# Patient Record
Sex: Female | Born: 1975 | Race: White | Hispanic: No | Marital: Married | Smoking: Never smoker
Health system: Southern US, Community
[De-identification: ages and names within clinical notes are randomized; demographics above are authoritative.]

## PROBLEM LIST (undated history)

## (undated) DIAGNOSIS — I890 Lymphedema, not elsewhere classified: Secondary | ICD-10-CM

## (undated) DIAGNOSIS — T4145XA Adverse effect of unspecified anesthetic, initial encounter: Secondary | ICD-10-CM

## (undated) DIAGNOSIS — N644 Mastodynia: Secondary | ICD-10-CM

## (undated) DIAGNOSIS — C50919 Malignant neoplasm of unspecified site of unspecified female breast: Secondary | ICD-10-CM

## (undated) DIAGNOSIS — B373 Candidiasis of vulva and vagina: Secondary | ICD-10-CM

## (undated) DIAGNOSIS — L290 Pruritus ani: Secondary | ICD-10-CM

## (undated) DIAGNOSIS — IMO0002 Reserved for concepts with insufficient information to code with codable children: Secondary | ICD-10-CM

## (undated) DIAGNOSIS — R87619 Unspecified abnormal cytological findings in specimens from cervix uteri: Secondary | ICD-10-CM

## (undated) DIAGNOSIS — T8859XA Other complications of anesthesia, initial encounter: Secondary | ICD-10-CM

## (undated) DIAGNOSIS — D649 Anemia, unspecified: Secondary | ICD-10-CM

## (undated) DIAGNOSIS — C801 Malignant (primary) neoplasm, unspecified: Secondary | ICD-10-CM

## (undated) DIAGNOSIS — R87629 Unspecified abnormal cytological findings in specimens from vagina: Secondary | ICD-10-CM

## (undated) HISTORY — DX: Candidiasis of vulva and vagina: B37.3

## (undated) HISTORY — DX: Anemia, unspecified: D64.9

## (undated) HISTORY — PX: WISDOM TOOTH EXTRACTION: SHX21

## (undated) HISTORY — DX: Pruritus ani: L29.0

## (undated) HISTORY — DX: Malignant neoplasm of unspecified site of unspecified female breast: C50.919

## (undated) HISTORY — DX: Mastodynia: N64.4

## (undated) HISTORY — DX: Reserved for concepts with insufficient information to code with codable children: IMO0002

## (undated) HISTORY — DX: Unspecified abnormal cytological findings in specimens from cervix uteri: R87.619

---

## 1998-06-19 HISTORY — PX: PILONIDAL CYST EXCISION: SHX744

## 1998-11-03 ENCOUNTER — Ambulatory Visit (HOSPITAL_BASED_OUTPATIENT_CLINIC_OR_DEPARTMENT_OTHER): Admission: RE | Admit: 1998-11-03 | Discharge: 1998-11-03 | Payer: Self-pay | Admitting: *Deleted

## 1999-03-28 ENCOUNTER — Other Ambulatory Visit: Admission: RE | Admit: 1999-03-28 | Discharge: 1999-03-28 | Payer: Self-pay | Admitting: Obstetrics & Gynecology

## 2002-04-17 ENCOUNTER — Other Ambulatory Visit: Admission: RE | Admit: 2002-04-17 | Discharge: 2002-04-17 | Payer: Self-pay | Admitting: Obstetrics and Gynecology

## 2003-04-15 ENCOUNTER — Other Ambulatory Visit: Admission: RE | Admit: 2003-04-15 | Discharge: 2003-04-15 | Payer: Self-pay | Admitting: Obstetrics and Gynecology

## 2004-04-01 ENCOUNTER — Other Ambulatory Visit: Admission: RE | Admit: 2004-04-01 | Discharge: 2004-04-01 | Payer: Self-pay | Admitting: Obstetrics and Gynecology

## 2005-04-05 ENCOUNTER — Other Ambulatory Visit: Admission: RE | Admit: 2005-04-05 | Discharge: 2005-04-05 | Payer: Self-pay | Admitting: Obstetrics and Gynecology

## 2006-03-15 ENCOUNTER — Other Ambulatory Visit: Admission: RE | Admit: 2006-03-15 | Discharge: 2006-03-15 | Payer: Self-pay | Admitting: Obstetrics and Gynecology

## 2006-03-28 ENCOUNTER — Encounter: Admission: RE | Admit: 2006-03-28 | Discharge: 2006-03-28 | Payer: Self-pay | Admitting: Obstetrics and Gynecology

## 2006-05-23 ENCOUNTER — Encounter: Admission: RE | Admit: 2006-05-23 | Discharge: 2006-08-21 | Payer: Self-pay | Admitting: Family Medicine

## 2007-03-29 ENCOUNTER — Encounter: Admission: RE | Admit: 2007-03-29 | Discharge: 2007-03-29 | Payer: Self-pay | Admitting: Obstetrics and Gynecology

## 2007-04-05 ENCOUNTER — Encounter: Admission: RE | Admit: 2007-04-05 | Discharge: 2007-04-05 | Payer: Self-pay | Admitting: Obstetrics and Gynecology

## 2007-05-27 ENCOUNTER — Encounter: Admission: RE | Admit: 2007-05-27 | Discharge: 2007-05-27 | Payer: Self-pay | Admitting: Obstetrics and Gynecology

## 2007-05-29 ENCOUNTER — Encounter: Admission: RE | Admit: 2007-05-29 | Discharge: 2007-05-29 | Payer: Self-pay | Admitting: Obstetrics and Gynecology

## 2007-12-15 ENCOUNTER — Encounter: Admission: RE | Admit: 2007-12-15 | Discharge: 2007-12-15 | Payer: Self-pay | Admitting: Obstetrics and Gynecology

## 2008-04-10 ENCOUNTER — Encounter: Admission: RE | Admit: 2008-04-10 | Discharge: 2008-04-10 | Payer: Self-pay | Admitting: Obstetrics and Gynecology

## 2009-04-12 ENCOUNTER — Encounter: Admission: RE | Admit: 2009-04-12 | Discharge: 2009-04-12 | Payer: Self-pay | Admitting: Obstetrics and Gynecology

## 2009-10-13 ENCOUNTER — Ambulatory Visit: Payer: Self-pay | Admitting: Family Medicine

## 2009-10-13 DIAGNOSIS — J019 Acute sinusitis, unspecified: Secondary | ICD-10-CM | POA: Insufficient documentation

## 2010-01-13 ENCOUNTER — Ambulatory Visit: Payer: Self-pay | Admitting: Family Medicine

## 2010-01-13 LAB — CONVERTED CEMR LAB
Nitrite: NEGATIVE
Specific Gravity, Urine: 1.015
WBC Urine, dipstick: NEGATIVE

## 2010-01-14 ENCOUNTER — Telehealth: Payer: Self-pay | Admitting: Family Medicine

## 2010-01-14 LAB — CONVERTED CEMR LAB
AST: 11 units/L (ref 0–37)
Albumin: 5.1 g/dL (ref 3.5–5.2)
Alkaline Phosphatase: 45 units/L (ref 39–117)
BUN: 14 mg/dL (ref 6–23)
HDL: 69 mg/dL (ref >39)
Hemoglobin: 13.6 g/dL (ref 12.0–15.0)
LDL Cholesterol: 91 mg/dL (ref 0–99)
MCHC: 33.2 g/dL (ref 30.0–36.0)
Potassium: 4.8 meq/L (ref 3.5–5.3)
RDW: 13.1 % (ref 11.5–15.5)
Sodium: 139 meq/L (ref 135–145)
Total Bilirubin: 1.8 mg/dL — ABNORMAL HIGH (ref 0.3–1.2)
Total CHOL/HDL Ratio: 2.5
VLDL: 11 mg/dL (ref 0–40)

## 2010-03-29 ENCOUNTER — Encounter: Admission: RE | Admit: 2010-03-29 | Discharge: 2010-03-29 | Payer: Self-pay | Admitting: Obstetrics and Gynecology

## 2010-07-19 NOTE — Assessment & Plan Note (Signed)
Summary: CPE   Vital Signs:  Patient profile:   35 year old female Menstrual status:  regular LMP:     12/23/2009 Height:      64 inches Weight:      142 pounds BMI:     24.46 O2 Sat:      100 % on Room air Pulse rate:   71 / minute BP sitting:   135 / 82  (left arm) Cuff size:   regular  Vitals Entered By: Payton Spark CMA (January 13, 2010 10:22 AM)  O2 Flow:  Room air CC: CPE w/out pap. School forms.  Vision Screening:Left eye w/o correction: 20 / 30 Right Eye w/o correction: 20 / 13 Both eyes w/o correction:  20/ 20  Color vision testing: normal      Vision Entered By: Payton Spark CMA (January 13, 2010 9:54 AM) LMP (date): 12/23/2009     Enter LMP: 12/23/2009   Primary Care Provider:  Seymour Bars DO  CC:  CPE w/out pap. School forms..  History of Present Illness: 35 yo WF presents for CPE.  Gyn care thru [facility].  She is healthy other than a hx of allergies.  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  She Denies fam hx of premature heart dz. Mammo yearly due to fam hx of premenopausal breast cancer.  She is Due for fasting labs.  Due only for Hep A vaccine.  Tdap is UTD.     Allergies: No Known Drug Allergies  Past History:  Past Medical History: Reviewed history from 10/13/2009 and no changes required. allergies G0  gyn  Past Surgical History: Reviewed history from 10/13/2009 and no changes required. pilonidal cyst removed 2000  Family History: Reviewed history from 10/13/2009 and no changes required. mother living, breast cancer at 48 father living, HTN sisters healthy  Social History: Reviewed history from 10/13/2009 and no changes required. 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 Maried to [employer]. No kids.  Has a dog and a cat. Exercises 1 hr 4 x a wk. Eats healthy. 3 ETOH/ wk.  Review of Systems  The patient denies anorexia, fever, weight loss, weight gain, vision loss, decreased hearing,  hoarseness, chest pain, syncope, dyspnea on exertion, peripheral edema, prolonged cough, headaches, hemoptysis, abdominal pain, melena, hematochezia, severe indigestion/heartburn, hematuria, incontinence, genital sores, muscle weakness, suspicious skin lesions, transient blindness, difficulty walking, depression, unusual weight change, abnormal bleeding, enlarged lymph nodes, angioedema, breast masses, and testicular masses.    Physical Exam  General:  alert, well-developed, well-nourished, and well-hydrated.   Head:  normocephalic and atraumatic.   Eyes:  pupils equal, pupils round, and pupils reactive to light.   Ears:  EACs patent; TMs translucent and gray with good cone of light and bony landmarks.  Nose:  no nasal discharge.   Mouth:  pharynx pink and moist.   Neck:  no masses.   Lungs:  Normal respiratory effort, chest expands symmetrically. Lungs are clear to auscultation, no crackles or wheezes. Heart:  Normal rate and regular rhythm. S1 and S2 normal without gallop, murmur, click, rub or other extra sounds. Abdomen:  Bowel sounds positive,abdomen soft and non-tender without masses, organomegaly or hernias noted. Msk:  no joint tenderness, no joint swelling, and no joint warmth.   Pulses:  2+ radial and pedal pulses Extremities:  no LE edema Skin:  color normal, no rashes, and no suspicious lesions.   Cervical Nodes:  No lymphadenopathy noted Psych:  good eye contact,  not anxious appearing, and not depressed appearing.     Impression & Recommendations:  Problem # 1:  HEALTH MAINTENANCE EXAM (ICD-V70.0)  BP/ BMI at goal. Continue allergy meds as needed. REcommend Hep A vaccine at [facility]. Update fasting labs today. Gyn care per gyn.  Annual mammogram since high risk. Continue healthy diet, regular exercise, MVI daily. Return for f/u as needed.  Orders: UA Dipstick w/o Micro (automated)  (81003)  Complete Medication List: 1)  Fluticasone Propionate 50  Mcg/act Susp (Fluticasone propionate) .... Use as directed 2)  Claritin 10 Mg Tabs (Loratadine) 3)  Claritin-d 24 Hour 10-240 Mg Xr24h-tab (Loratadine-pseudoephedrine)  Other Orders: T-CBC No Diff ([id number]) T-Comprehensive Metabolic Panel ([id number]) T-Lipid Profile ([id number])  Patient Instructions: 1)  Update fasting labs today. 2)  Will call you w/ results tomorrow. 3)  Hep A vaccine at [facility] downstairs. 4)  Return for f/u as needed.  Laboratory Results   Urine Tests    Routine Urinalysis   Color: yellow Appearance: Clear Glucose: negative   (Normal Range: Negative) Bilirubin: negative   (Normal Range: Negative) Ketone: negative   (Normal Range: Negative) Spec. Gravity: 1.015   (Normal Range: 1.003-1.035) Blood: negative   (Normal Range: Negative) pH: 7.0   (Normal Range: 5.0-8.0) Protein: negative   (Normal Range: Negative) Urobilinogen: 0.2   (Normal Range: 0-1) Nitrite: negative   (Normal Range: Negative) Leukocyte Esterace: negative   (Normal Range: Negative)       Appended Document: CPE   Hepatitis A Vaccine # 1    Vaccine Type: HepA    Site: right deltoid    Dose: 1.0 ml    Route: IM    Given by: Payton Spark CMA    Exp. Date: 08/29/2011    Lot #: [id number]    VIS given: 09/06/04 version given January 13, 2010.

## 2010-07-19 NOTE — Miscellaneous (Signed)
Summary: Vaccine Record/[place]  Vaccine Record/[place]   Imported By: Lanelle Bal 01/21/2010 12:22:03  _____________________________________________________________________  External Attachment:    Type:   Image     Comment:   External Document

## 2010-07-19 NOTE — Assessment & Plan Note (Signed)
Summary: SINUS INF   Vital Signs:  Patient profile:   35 year old female Menstrual status:  regular LMP:     09/23/2009 Height:      64 inches Weight:      143 pounds BMI:     24.63 O2 Sat:      100 % on Room air Temp:     99.1 degrees F oral Pulse rate:   85 / minute BP sitting:   130 / 88  (left arm) Cuff size:   regular  Vitals Entered By: Payton Spark CMA (October 13, 2009 2:09 PM)  O2 Flow:  Room air CC: New to est. Sinusitis x 2 days, cough and allergies.  LMP (date): 09/23/2009     Menstrual Status regular Enter LMP: 09/23/2009   Primary Care Provider:  Seymour Bars DO  CC:  New to est. Sinusitis x 2 days and cough and allergies. .  History of Present Illness: 35 yo WF presents for feeling sick 8 days ago with sore throat, cough and congestion.  Started to get better but now feels worse.  Drainage changed from clear to colored.  Her cough is getting better but her sinuses are more stuffy and she is not blowing much out.  She is on Flonase + Claritin D but neither seems to be helping.  She has a HA across her forehead.  Not taking anything.  She has had chills but no fevers.  No GI upset.  She has no appetitie.    She has seasonal allergies.      Current Medications (verified): 1)  Fluticasone Propionate 50 Mcg/act Susp (Fluticasone Propionate) .... 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 Maried to [employer]. No kids.  Has a dog and a cat. Exercises 1 hr 4 x a wk. Eats healthy. 3 ETOH/ wk.  Review of Systems       no fevers/sweats/weakness, unexplained wt  loss/gain, no change in vision, no difficulty hearing, ringing in ears, no hay fever/allergies, no CP/discomfort, no palpitations, no breast lump/nipple discharge, + cough/wheeze, no blood in stool,no  N/V/D, no nocturia, no leaking urine, no unusual vag bleeding, no vaginal/penile discharge, no muscle/joint pain, no rash, no new/changing mole, no HA, no memory loss, no anxiety, no sleep problem, no depression, no unexplained lumps, no easy bruising/bleeding, no concern with sexual function   Physical Exam  General:  alert, well-developed, well-nourished, and well-hydrated.   Head:  normocephalic and atraumatic.  frontal and maxillary sinuses TTP Eyes:  conjunctiva clear Nose:  nasal congestion Mouth:  throat w/o injection; good dentition and pharynx pink and moist.   Neck:  no masses.   Lungs:  Normal respiratory effort, chest expands symmetrically. Lungs are clear to auscultation, no crackles or wheezes. Heart:  Normal rate and regular rhythm. S1 and S2 normal without gallop, murmur, click, rub or other extra sounds. Skin:  color normal.   Cervical Nodes:  shotty anterior cervical LA Psych:  good eye contact, not anxious appearing, and not depressed appearing.     Impression & Recommendations:  Problem # 1:  ACUTE SINUSITIS, UNSPECIFIED (ICD-461.9)  Her updated medication list  for this problem includes:    Fluticasone Propionate 50 Mcg/act Susp (Fluticasone propionate) ..... Use as directed    Claritin-d 24 Hour 10-240 Mg Xr24h-tab (Loratadine-pseudoephedrine)    Amoxicillin-pot Clavulanate 875-125 Mg Tabs (Amoxicillin-pot clavulanate) .Marland Kitchen... 1 tab by mouth q 12 hrs x 10 days, take with food  Instructed on treatment. Call if symptoms persist or worsen.   Complete Medication List: 1)  Fluticasone Propionate 50 Mcg/act Susp (Fluticasone propionate) .... Use as directed 2)  Claritin 10 Mg Tabs (Loratadine) 3)  Claritin-d 24 Hour 10-240 Mg Xr24h-tab (Loratadine-pseudoephedrine) 4)   Amoxicillin-pot Clavulanate 875-125 Mg Tabs (Amoxicillin-pot clavulanate) .Marland Kitchen.. 1 tab by mouth q 12 hrs x 10 days, take with food  Patient Instructions: 1)  Take 10 days of Augmentin for sinusitis. 2)  Use OTC Advil cold and sinus for symptoms. 3)  Rest, clear fluids. 4)  Call if not improved in 10 days or any problems with your meds. Prescriptions: AMOXICILLIN-POT CLAVULANATE 875-125 MG TABS (AMOXICILLIN-POT CLAVULANATE) 1 tab by mouth q 12 hrs x 10 days, take with food  #20 x 0   Entered and Authorized by:   Seymour Bars DO   Signed by:   Seymour Bars DO on 10/13/2009   Method used:   Electronically to        [facility]* (retail)       [address] [place], [place]  [address]       Ph: [phone]       Fax: [phone]   RxID:   [id number]

## 2010-07-19 NOTE — Progress Notes (Signed)
  Phone Note Call from Patient Call back at [phone]   Caller: Patient Call For: Seymour Bars DO Summary of Call: Pt LM on Michelle VM wanting to know results of hemoglobin and a copy of her labs. Initial call taken by: Kathlene November,  January 14, 2010 1:57 PM  Follow-up for Phone Call        Called pt- given results and put copy of labwork in the mail to pt Follow-up by: Kathlene November,  January 14, 2010 1:58 PM

## 2010-08-10 ENCOUNTER — Ambulatory Visit (INDEPENDENT_AMBULATORY_CARE_PROVIDER_SITE_OTHER): Payer: BC Managed Care – PPO

## 2010-08-10 ENCOUNTER — Encounter (INDEPENDENT_AMBULATORY_CARE_PROVIDER_SITE_OTHER): Payer: Self-pay | Admitting: *Deleted

## 2010-08-10 DIAGNOSIS — Z23 Encounter for immunization: Secondary | ICD-10-CM

## 2010-08-16 NOTE — Assessment & Plan Note (Signed)
Summary: 2nd round of Hep A- jr  Nurse Visit   Vitals Entered By: Payton Spark CMA (August 10, 2010 9:35 AM)  Allergies: No Known Drug Allergies  Immunizations Administered:  Hepatitis A Vaccine # 2:    Vaccine Type: HepA    Site: right deltoid    Dose: 0.5 ml    Route: IM    Given by: Payton Spark CMA    VIS given: 09/06/04 version given August 10, 2010.  Orders Added: 1)  Hepatitis A Vaccine (Adult Dose) [90632] 2)  Admin 1st Vaccine [62130]

## 2010-09-14 ENCOUNTER — Ambulatory Visit: Payer: BC Managed Care – PPO | Attending: Orthopedic Surgery | Admitting: Occupational Therapy

## 2010-09-14 DIAGNOSIS — M6281 Muscle weakness (generalized): Secondary | ICD-10-CM | POA: Insufficient documentation

## 2010-09-14 DIAGNOSIS — IMO0001 Reserved for inherently not codable concepts without codable children: Secondary | ICD-10-CM | POA: Insufficient documentation

## 2010-09-14 DIAGNOSIS — M25539 Pain in unspecified wrist: Secondary | ICD-10-CM | POA: Insufficient documentation

## 2010-09-21 ENCOUNTER — Encounter: Payer: BC Managed Care – PPO | Admitting: Occupational Therapy

## 2010-10-20 ENCOUNTER — Other Ambulatory Visit: Payer: Self-pay | Admitting: *Deleted

## 2010-10-20 MED ORDER — FLUTICASONE PROPIONATE 50 MCG/ACT NA SUSP: 2.0000 | Freq: Every day | NASAL | Status: DC

## 2011-01-17 ENCOUNTER — Other Ambulatory Visit: Payer: Self-pay | Admitting: Obstetrics and Gynecology

## 2011-01-17 DIAGNOSIS — Z1231 Encounter for screening mammogram for malignant neoplasm of breast: Secondary | ICD-10-CM

## 2011-03-13 ENCOUNTER — Other Ambulatory Visit: Payer: Self-pay | Admitting: Obstetrics and Gynecology

## 2011-03-13 ENCOUNTER — Ambulatory Visit: Payer: BC Managed Care – PPO

## 2011-03-13 DIAGNOSIS — N644 Mastodynia: Secondary | ICD-10-CM

## 2011-03-14 ENCOUNTER — Ambulatory Visit
Admission: RE | Admit: 2011-03-14 | Discharge: 2011-03-14 | Disposition: A | Discharge status: Home / Self Care | Payer: BC Managed Care – PPO | Source: Ambulatory Visit | Attending: Obstetrics and Gynecology | Admitting: Obstetrics and Gynecology

## 2011-03-14 DIAGNOSIS — N644 Mastodynia: Secondary | ICD-10-CM

## 2011-06-24 ENCOUNTER — Telehealth: Payer: Self-pay | Admitting: Family

## 2011-06-24 NOTE — Telephone Encounter (Signed)
called pts home lmovm to rtn call to scheduled appt for high risk clinic

## 2011-06-27 ENCOUNTER — Telehealth: Payer: Self-pay | Admitting: Oncology

## 2011-06-27 NOTE — Telephone Encounter (Signed)
third request for pt to rtn call and no response, will rtn chart to [facility]

## 2011-06-28 ENCOUNTER — Telehealth: Payer: Self-pay | Admitting: Family

## 2011-06-28 NOTE — Telephone Encounter (Signed)
called pt lmovm to rtn call to scheduled high risk appt

## 2011-08-09 ENCOUNTER — Encounter: Payer: Self-pay | Admitting: *Deleted

## 2011-08-09 NOTE — Progress Notes (Signed)
Mailed letter to pt about High Risk clinic. 

## 2011-12-15 ENCOUNTER — Ambulatory Visit (INDEPENDENT_AMBULATORY_CARE_PROVIDER_SITE_OTHER): Payer: BC Managed Care – PPO | Admitting: Physician Assistant

## 2011-12-15 DIAGNOSIS — Z111 Encounter for screening for respiratory tuberculosis: Secondary | ICD-10-CM

## 2011-12-15 NOTE — Progress Notes (Signed)
  Subjective:    Patient ID: Darlene Grant, female    DOB: 1976-02-01, 36 y.o.   MRN: [id number]  HPI   Pt in for a tb skin test  Review of Systems     Objective:   Physical Exam        Assessment & Plan:  PPD completed. Tandy Gaw PA-C

## 2011-12-18 LAB — TB SKIN TEST: TB Skin Test: NEGATIVE

## 2012-02-20 ENCOUNTER — Other Ambulatory Visit: Payer: Self-pay | Admitting: Obstetrics and Gynecology

## 2012-02-20 DIAGNOSIS — Z139 Encounter for screening, unspecified: Secondary | ICD-10-CM

## 2012-03-04 ENCOUNTER — Ambulatory Visit (INDEPENDENT_AMBULATORY_CARE_PROVIDER_SITE_OTHER): Payer: BC Managed Care – PPO | Admitting: Obstetrics and Gynecology

## 2012-03-04 ENCOUNTER — Encounter: Payer: Self-pay | Admitting: Obstetrics and Gynecology

## 2012-03-04 VITALS — BP 102/72 | HR 78 | Ht 64.0 in | Wt 147.0 lb

## 2012-03-04 DIAGNOSIS — Z01419 Encounter for gynecological examination (general) (routine) without abnormal findings: Secondary | ICD-10-CM

## 2012-03-04 DIAGNOSIS — Z124 Encounter for screening for malignant neoplasm of cervix: Secondary | ICD-10-CM

## 2012-03-04 MED ORDER — FLUTICASONE PROPIONATE 50 MCG/ACT NA SUSP: 2.0000 | Freq: Every day | NASAL | Status: DC

## 2012-03-04 NOTE — Patient Instructions (Signed)
Take a multivitamin with at least 400 mcg of folic acid (most prenatal vitamins have 1000 mcg = 1 mg)

## 2012-03-04 NOTE — Progress Notes (Signed)
Subjective:    Jalasia Eskridge is a 36 y.o. female, G0P0, who presents for an annual exam. The patient is considering conception.   PMH: first degree relative breast cancer at age 68, pruritis ani  Menstrual cycle:   LMP: Patient's last menstrual period was 02/15/2012.             Review of Systems Pertinent items are noted in HPI. Denies pelvic pain, urinary tract symptoms, vaginitis symptoms, irregular bleeding, menopausal symptoms, change in bowel habits or rectal bleeding   Objective:    BP 102/72  Pulse 78  Ht 5\' 4"  (1.626 m)  Wt 147 lb (66.679 kg)  BMI 25.23 kg/m2  LMP 02/15/2012   Wt Readings from Last 1 Encounters:  03/04/12 147 lb (66.679 kg)   Body mass index is 25.23 kg/(m^2). General Appearance: Alert, no acute distress HEENT: Grossly normal Neck / Thyroid: Supple, no thyromegaly or cervical adenopathy Lungs: Clear to auscultation bilaterally Back: No CVA tenderness Breast Exam: No masses or nodes.No dimpling, nipple retraction or discharge. Cardiovascular: Regular rate and rhythm.  Gastrointestinal: Soft, non-tender, no masses or organomegaly Pelvic Exam: EGBUS-wnl, vagina-normal rugae, cervix- without lesions or tenderness, uterus appears normal size shape and consistency, adnexae-no masses or tenderness Lymphatic Exam: Non-palpable nodes in neck, clavicular,  axillary, or inguinal regions  Skin: no rashes or abnormalities Extremities: no clubbing cyanosis or edema  Neurologic: grossly normal Psychiatric: Alert and oriented   Assessment:   Routine GYN Exam Considering Conception   Plan:  MVI with at least 400 mcg of folic acid PAP sent RTO 1 year or prn  Amaad Byers,ELMIRAPA-C

## 2012-03-04 NOTE — Progress Notes (Signed)
Regular Periods: yes Mammogram: yes  Monthly Breast Ex.: yes Exercise: yes  Tetanus < 10 years: yes Seatbelts: yes  NI. Bladder Functn.: yes Abuse at home: no  Daily BM's: yes Stressful Work: yes  Healthy Diet: yes Sigmoid-Colonoscopy: NO  Calcium: yes Medical problems this year: ? ABOUT CONCEPTION   LAST PAP:2012  ASCUS  HPV NOT DETECTED  Contraception: CONDOMS  Mammogram:  2012  SCHEDULED OCT. 2013  PCP: DR. Linford Arnold  PMH: NO CHANGE  FMH: NO CHANGE  Last Bone Scan: NO  PT IS MARRIED

## 2012-03-06 LAB — PAP IG W/ RFLX HPV ASCU

## 2012-03-18 ENCOUNTER — Ambulatory Visit: Payer: Self-pay | Admitting: Obstetrics and Gynecology

## 2012-03-19 ENCOUNTER — Ambulatory Visit (INDEPENDENT_AMBULATORY_CARE_PROVIDER_SITE_OTHER): Payer: BC Managed Care – PPO

## 2012-03-19 DIAGNOSIS — Z139 Encounter for screening, unspecified: Secondary | ICD-10-CM

## 2012-03-19 DIAGNOSIS — Z1231 Encounter for screening mammogram for malignant neoplasm of breast: Secondary | ICD-10-CM

## 2012-03-25 ENCOUNTER — Encounter: Payer: Self-pay | Admitting: Obstetrics and Gynecology

## 2012-03-28 ENCOUNTER — Other Ambulatory Visit: Payer: Self-pay

## 2012-04-03 ENCOUNTER — Telehealth: Payer: Self-pay | Admitting: Obstetrics and Gynecology

## 2012-04-03 ENCOUNTER — Other Ambulatory Visit: Payer: Self-pay | Admitting: Obstetrics and Gynecology

## 2012-04-03 DIAGNOSIS — Z803 Family history of malignant neoplasm of breast: Secondary | ICD-10-CM

## 2012-04-03 NOTE — Telephone Encounter (Signed)
Can pt have order for MRI

## 2012-04-04 ENCOUNTER — Telehealth: Payer: Self-pay | Admitting: Obstetrics and Gynecology

## 2012-04-04 DIAGNOSIS — Z803 Family history of malignant neoplasm of breast: Secondary | ICD-10-CM

## 2012-04-04 NOTE — Progress Notes (Signed)
Darlene Grant please schd thanks

## 2012-04-04 NOTE — Telephone Encounter (Signed)
barbra please schd

## 2012-04-10 ENCOUNTER — Encounter: Payer: Self-pay | Admitting: Obstetrics and Gynecology

## 2012-04-10 ENCOUNTER — Telehealth: Payer: Self-pay | Admitting: Obstetrics and Gynecology

## 2012-04-10 NOTE — Telephone Encounter (Signed)
Chose option #2 and spoke to [facility] about the peer to peer request from their office regarding patient's order for  a breast MRI.  Patient's mother had breast cancer at age 34 however, patient's risk cannot be calculated using the [facility] because she is < 35 YO.  BC/BS of [place] requires a risk greater than 20% in order to approve an MRI.  Darlene Farabee, PA-C

## 2012-04-19 HISTORY — PX: HAND SURGERY: SHX662

## 2012-04-22 ENCOUNTER — Other Ambulatory Visit: Payer: BC Managed Care – PPO

## 2012-04-24 ENCOUNTER — Encounter: Payer: Self-pay | Admitting: Physician Assistant

## 2012-04-24 ENCOUNTER — Ambulatory Visit (INDEPENDENT_AMBULATORY_CARE_PROVIDER_SITE_OTHER): Payer: BC Managed Care – PPO | Admitting: Physician Assistant

## 2012-04-24 VITALS — BP 113/65 | HR 69 | Ht 64.0 in | Wt 147.0 lb

## 2012-04-24 DIAGNOSIS — M25551 Pain in right hip: Secondary | ICD-10-CM

## 2012-04-24 DIAGNOSIS — M25559 Pain in unspecified hip: Secondary | ICD-10-CM

## 2012-04-24 MED ORDER — CYCLOBENZAPRINE HCL 5 MG PO TABS: 5.0000 mg | ORAL_TABLET | Freq: Every evening | ORAL | Status: DC | PRN

## 2012-04-24 MED ORDER — KETOROLAC TROMETHAMINE 30 MG/ML IJ SOLN
30.0000 mg | Freq: Once | INTRAMUSCULAR | Status: AC
  Administered 2012-04-24: 30 mg via INTRAVENOUS

## 2012-04-24 MED ORDER — MELOXICAM 7.5 MG PO TABS: 7.5000 mg | ORAL_TABLET | Freq: Every day | ORAL | Status: DC

## 2012-04-24 NOTE — Progress Notes (Signed)
  Subjective:    Patient ID: Darlene Grant, female    DOB: June 10, 1976, 36 y.o.   MRN: [id number]  HPI Patient is a pleasant 36 yo female who presents to the clinic with right hip pain. This pain has been going on for 4 days. She does not remember any trauma to the area. She walks a lot with her treadmill at inclines. She does not remember pulling anything. Her activity level has not changed either. Her pain is 6-7/10. Stepping up makes worse. Regular walking it is stable. She has tried aleve but has only helped minimally. She is going out of town this weekend and wanted to get relief before then. Has not changed activity level thinking staying active will help. Denies any numbness or tingling/radiation of pain down leg. No back pain or recent injuries.     Review of Systems     Objective:   Physical Exam  Constitutional: She appears well-developed and well-nourished.  HENT:  Head: Normocephalic and atraumatic.  Cardiovascular: Normal rate, regular rhythm and normal heart sounds.   Musculoskeletal:       Right hip-ROM was full but pain elicited with passive and active ROM internally and externally with internal greater than external. NO pinpoint tenderness of bursa. More tenderness posteriorly palpating from gluteus maximus. No iliotibial band tenderness. Hamstrings intact and no tenderness to palpation. Strength 5/5. Reflexes 2+. Negative straight leg test.  Left hip normal.          Assessment & Plan:  Right hip pain- I suspect this is hip bursitis, However, physical exam did not elicit pinpoint tenderness. I am thinking it could be inflammation of the deeper bursa or perhaps tendons connected to ischial tuberosity. Treat with mobic for 2 weeks then PRN. Gave flexeril to use at night. Encouraged ice and rest or injury. Keep walking but take out inclines for next 2 weeks. Gave stretches to start rehab process. Did give shot of toradol in office to get ahead of inflammation.  Follow up if not improving in 2 weeks.

## 2012-04-24 NOTE — Patient Instructions (Addendum)
Flexeril at bedtime. Mobic daily for 2 weeks then only PRN.     Hip Bursitis Bursitis is a swelling and soreness (inflammation) of a fluid-filled sac (bursa). This sac overlies and protects the joints.  CAUSES   Injury.  Overuse of the muscles surrounding the joint.  Arthritis.  Gout.  Infection.  Cold weather.  Inadequate warm-up and conditioning prior to activities. The cause may not be known.  SYMPTOMS   Mild to severe irritation.  Tenderness and swelling over the outside of the hip.  Pain with motion of the hip.  If the bursa becomes infected, a fever may be present. Redness, tenderness, and warmth will develop over the hip. Symptoms usually lessen in 3 to 4 weeks with treatment, but can come back. TREATMENT If conservative treatment does not work, your caregiver may advise draining the bursa and injecting cortisone into the area. This may speed up the healing process. This may also be used as an initial treatment of choice. HOME CARE INSTRUCTIONS   Apply ice to the affected area for 15 to 20 minutes every 3 to 4 hours while awake for the first 2 days. Put the ice in a plastic bag and place a towel between the bag of ice and your skin.  Rest the painful joint as much as possible, but continue to put the joint through a normal range of motion at least 4 times per day. When the pain lessens, begin normal, slow movements and usual activities to help prevent stiffness of the hip.  Only take over-the-counter or prescription medicines for pain, discomfort, or fever as directed by your caregiver.  Use crutches to limit weight bearing on the hip joint, if advised.  Elevate your painful hip to reduce swelling. Use pillows for propping and cushioning your legs and hips.  Gentle massage may provide comfort and decrease swelling. SEEK IMMEDIATE MEDICAL CARE IF:   Your pain increases even during treatment, or you are not improving.  You have a fever.  You have heat and  inflammation over the involved bursa.  You have any other questions or concerns. MAKE SURE YOU:   Understand these instructions.  Will watch your condition.  Will get help right away if you are not doing well or get worse. Document Released: 11/25/2001 Document Revised: 08/28/2011 Document Reviewed: 06/24/2008 [facility] Patient Information 2013 [place], [place].

## 2012-04-28 ENCOUNTER — Other Ambulatory Visit: Payer: BC Managed Care – PPO

## 2012-07-05 ENCOUNTER — Encounter: Payer: Self-pay | Admitting: Physician Assistant

## 2012-07-05 ENCOUNTER — Ambulatory Visit (INDEPENDENT_AMBULATORY_CARE_PROVIDER_SITE_OTHER): Payer: BC Managed Care – PPO | Admitting: Physician Assistant

## 2012-07-05 VITALS — BP 127/79 | HR 73 | Wt 147.0 lb

## 2012-07-05 DIAGNOSIS — Z0000 Encounter for general adult medical examination without abnormal findings: Secondary | ICD-10-CM

## 2012-07-05 DIAGNOSIS — T148XXA Other injury of unspecified body region, initial encounter: Secondary | ICD-10-CM | POA: Insufficient documentation

## 2012-07-05 DIAGNOSIS — Z13220 Encounter for screening for lipoid disorders: Secondary | ICD-10-CM

## 2012-07-05 DIAGNOSIS — Z131 Encounter for screening for diabetes mellitus: Secondary | ICD-10-CM

## 2012-07-05 DIAGNOSIS — D649 Anemia, unspecified: Secondary | ICD-10-CM

## 2012-07-05 LAB — LIPID PANEL
HDL: 72 mg/dL (ref >39)
LDL Cholesterol: 83 mg/dL (ref 0–99)
Total CHOL/HDL Ratio: 2.3 Ratio
Triglycerides: 45 mg/dL (ref <150)
VLDL: 9 mg/dL (ref 0–40)

## 2012-07-05 LAB — CBC
HCT: 38.3 % (ref 36.0–46.0)
Hemoglobin: 12.7 g/dL (ref 12.0–15.0)
MCV: 91.8 fL (ref 78.0–100.0)
RBC: 4.17 MIL/uL (ref 3.87–5.11)
WBC: 5 10*3/uL (ref 4.0–10.5)

## 2012-07-05 LAB — COMPREHENSIVE METABOLIC PANEL
ALT: 11 U/L (ref 0–35)
Alkaline Phosphatase: 44 U/L (ref 39–117)
Creat: 0.71 mg/dL (ref 0.50–1.10)
Sodium: 136 mEq/L (ref 135–145)
Total Bilirubin: 1.3 mg/dL — ABNORMAL HIGH (ref 0.3–1.2)
Total Protein: 7 g/dL (ref 6.0–8.3)

## 2012-07-05 LAB — TSH: TSH: 0.611 u[IU]/mL (ref 0.350–4.500)

## 2012-07-05 LAB — FERRITIN: Ferritin: 30 ng/mL (ref 10–291)

## 2012-07-05 LAB — IRON: Iron: 128 ug/dL (ref 42–145)

## 2012-07-05 LAB — IBC PANEL
TIBC: 338 ug/dL (ref 250–470)
UIBC: 210 ug/dL (ref 125–400)

## 2012-07-05 NOTE — Progress Notes (Signed)
Subjective:     Darlene Grant is a 37 y.o. female and is here for a comprehensive physical exam. The patient reports problems - The patient is concerned that she is remaining anemic. Her hemoglobin was 8.5 before surgery in November for ligament tear in right hand. patient surgery didn't fall a week before her period. She does report that her menstrual cycles are long and heavy but regular. Patient denies any shortness of breath, fatigue, weakness, hair long, skin changes or any symptoms that would be suggestive of anemia She denies any excessive pain or cramping. She would like to get pregnant in the next year and wants to make sure everything is in line to do so. She has already added folic acid and DHA to her folic acid supplements.   Her right hand is doing much better since surgery to repair a ligament tear. She will followup with the surgeon in the next month. She is currently undergoing physical therapy at home.    History   Social History  . Marital Status: Married    Spouse Name: N/A    Number of Children: N/A  . Years of Education: N/A   Occupational History  . Not on file.   Social History Main Topics  . Smoking status: Never Smoker   . Smokeless tobacco: Never Used  . Alcohol Use: Yes  . Drug Use: No  . Sexually Active: Yes    Birth Control/ Protection: Condom   Other Topics Concern  . Not on file   Social History Narrative  . No narrative on file   Health Maintenance  Topic Date Due  . Influenza Vaccine  02/17/2013  . Pap Smear  02/22/2013  . Mammogram  03/19/2013  . Tetanus/tdap  06/24/2016    The following portions of the patient's history were reviewed and updated as appropriate: allergies, current medications, past family history, past medical history, past social history, past surgical history and problem list.  Review of Systems A comprehensive review of systems was negative.   Objective:    BP 127/79  Pulse 73  Wt 147 lb (66.679 kg)  LMP  06/14/2012 General appearance: alert, cooperative and appears stated age Head: Normocephalic, without obvious abnormality, atraumatic Eyes: conjunctivae/corneas clear. PERRL, EOM's intact. Fundi benign. Ears: normal TM's and external ear canals both ears Nose: Nares normal. Septum midline. Mucosa normal. No drainage or sinus tenderness. Throat: lips, mucosa, and tongue normal; teeth and gums normal Neck: no adenopathy, no carotid bruit, no JVD, supple, symmetrical, trachea midline and thyroid not enlarged, symmetric, no tenderness/mass/nodules Back: symmetric, no curvature. ROM normal. No CVA tenderness. Lungs: clear to auscultation bilaterally Heart: regular rate and rhythm, S1, S2 normal, no murmur, click, rub or gallop Abdomen: soft, non-tender; bowel sounds normal; no masses,  no organomegaly Extremities: extremities normal, atraumatic, no cyanosis or edema Pulses: 2+ and symmetric Skin: Skin color, texture, turgor normal. No rashes or lesions Lymph nodes: Cervical, supraclavicular, and axillary nodes normal. Neurologic: Grossly normal    Assessment:    Healthy female exam.      Plan:    CPE-patient was sent to get fasting blood work as well as we will check iron panel, ferritin, TSH, B12, vitamin D, folate. Patient is on a lot of supplements and on to make sure that she is on the adequate doses of those medications. Patient encouraged to continue taking supplements and exercising regularly. Discussed the patient's importance of regular exercise even into pregnancy especially with her age and pregnancy. Vaccines are  up-to-date. Pap smear and mammogram due to family history of breast cancer up-to-date. We'll call with results and discuss treatment for anemia if labs confirm previous testing.  See After Visit Summary for Counseling Recommendations

## 2012-07-05 NOTE — Patient Instructions (Addendum)
Continue MVI. Already added Folic acid so should be great to start trying when you are ready.   Continue exercising regularly. Keep healthy diet.   Will call if you need to start iron.      Jade Breeback PA-C  [email or web address]

## 2012-07-08 ENCOUNTER — Telehealth: Payer: Self-pay | Admitting: Physician Assistant

## 2012-07-08 NOTE — Telephone Encounter (Signed)
Call pt regarding labs: Cholesterol is great. Hgb is 12.7 which is in normal range but low normal. B12, vitamin D, folate looks great. Kidney, liver and screening sugar look wonderful. TSH is on low side but normal. Ferritin iron stores are very low. I recommend starting oral iron 325mg  twice a day for 2 weeks then once a day from then on out and have ferritin and hgB recheck in 6-8 weeks to make sure is a better range.

## 2012-07-08 NOTE — Telephone Encounter (Signed)
LMOM for pt to call back for results

## 2012-07-12 ENCOUNTER — Encounter: Payer: Self-pay | Admitting: Physician Assistant

## 2012-07-12 ENCOUNTER — Telehealth: Payer: Self-pay | Admitting: Physician Assistant

## 2012-07-12 NOTE — Telephone Encounter (Signed)
Error

## 2012-08-08 ENCOUNTER — Encounter: Payer: Self-pay | Admitting: Physician Assistant

## 2012-08-09 NOTE — Telephone Encounter (Signed)
Routed to [facility]

## 2012-08-14 ENCOUNTER — Encounter: Payer: Self-pay | Admitting: Physician Assistant

## 2012-08-14 ENCOUNTER — Ambulatory Visit (INDEPENDENT_AMBULATORY_CARE_PROVIDER_SITE_OTHER): Payer: BC Managed Care – PPO | Admitting: Physician Assistant

## 2012-08-14 VITALS — BP 121/77 | HR 102 | Temp 97.8°F

## 2012-08-14 DIAGNOSIS — J069 Acute upper respiratory infection, unspecified: Secondary | ICD-10-CM

## 2012-08-14 DIAGNOSIS — J019 Acute sinusitis, unspecified: Secondary | ICD-10-CM

## 2012-08-14 MED ORDER — METHYLPREDNISOLONE SODIUM SUCC 125 MG IJ SOLR
125.0000 mg | Freq: Once | INTRAMUSCULAR | Status: AC
  Administered 2012-08-14: 125 mg via INTRAMUSCULAR

## 2012-08-14 MED ORDER — AMOXICILLIN-POT CLAVULANATE 875-125 MG PO TABS: 1.0000 | ORAL_TABLET | Freq: Two times a day (BID) | ORAL | Status: DC

## 2012-08-14 NOTE — Patient Instructions (Signed)

## 2012-08-14 NOTE — Progress Notes (Signed)
  Subjective:    Patient ID: Darlene Grant, female    DOB: 1976-04-14, 37 y.o.   MRN: [id number]  HPI Patient comes into the clinic and not been feeling well since the beginning for febuary. It started with runny nose and sinus congestion. She felt like she was getting better after taking advil cold and sinus and flonase. She then started to feel worse. 3 days ago she started having body aches, lot's of sinus pressure, mild cough, ear pressure, ST. She has not ran a fever, no n/v/d. She added mucinex D 3 days ago and has not helped. She has not been able to go to work this week.    Review of Systems     Objective:   Physical Exam  Constitutional: She is oriented to person, place, and time. She appears well-developed and well-nourished.  HENT:  Head: Normocephalic and atraumatic.  TM's clear but retracted with air bubbles bilaterally.   Frontal and maxillary tenderness.   Bilateral red and swollen turbinates.   PND on oropharynx.  Eyes: Conjunctivae are normal. Right eye exhibits no discharge. Left eye exhibits no discharge.  Neck: Normal range of motion. Neck supple.  Cardiovascular: Normal rate, regular rhythm and normal heart sounds.   Pulmonary/Chest: Effort normal and breath sounds normal. She has no wheezes.  Lymphadenopathy:    She has no cervical adenopathy.  Neurological: She is alert and oriented to person, place, and time.  Skin: Skin is warm and dry.  Psychiatric: She has a normal mood and affect. Her behavior is normal.          Assessment & Plan:  Sinusitis- treated with augementin for 10 days. Gave shot of solumedrol 125 since inflammation going on for 3 weeks. Continue flonase and advil cold and sinus. Call if not improving. Gave work note.

## 2012-08-18 ENCOUNTER — Encounter: Payer: Self-pay | Admitting: Physician Assistant

## 2012-08-19 ENCOUNTER — Other Ambulatory Visit: Payer: Self-pay | Admitting: Physician Assistant

## 2012-08-19 MED ORDER — AZITHROMYCIN 250 MG PO TABS: ORAL_TABLET | ORAL | Status: DC

## 2012-08-28 ENCOUNTER — Encounter: Payer: Self-pay | Admitting: Physician Assistant

## 2012-08-28 ENCOUNTER — Telehealth: Payer: Self-pay | Admitting: *Deleted

## 2012-08-28 DIAGNOSIS — D649 Anemia, unspecified: Secondary | ICD-10-CM

## 2012-08-28 NOTE — Telephone Encounter (Signed)
Labs ordered.

## 2012-08-30 LAB — IRON: Iron: 131 ug/dL (ref 42–145)

## 2012-08-30 LAB — FERRITIN: Ferritin: 62 ng/mL (ref 10–291)

## 2012-10-20 ENCOUNTER — Encounter: Payer: Self-pay | Admitting: Physician Assistant

## 2012-10-21 ENCOUNTER — Other Ambulatory Visit: Payer: Self-pay | Admitting: Physician Assistant

## 2012-10-21 ENCOUNTER — Telehealth: Payer: Self-pay | Admitting: Physician Assistant

## 2012-10-21 MED ORDER — FLUTICASONE PROPIONATE 50 MCG/ACT NA SUSP: 2.0000 | Freq: Every day | NASAL | Status: DC

## 2012-10-21 NOTE — Telephone Encounter (Signed)
Please send to lab. Ferritin, iron, hgb and IBC.

## 2012-10-22 ENCOUNTER — Telehealth: Payer: Self-pay | Admitting: *Deleted

## 2012-10-22 DIAGNOSIS — D649 Anemia, unspecified: Secondary | ICD-10-CM

## 2012-10-22 NOTE — Telephone Encounter (Signed)
Pt notified labs faxed

## 2012-10-22 NOTE — Telephone Encounter (Signed)
Labs ordered.

## 2012-10-26 LAB — FERRITIN: Ferritin: 44 ng/mL (ref 10–291)

## 2012-10-29 ENCOUNTER — Encounter: Payer: Self-pay | Admitting: Physician Assistant

## 2012-10-30 ENCOUNTER — Other Ambulatory Visit: Payer: Self-pay | Admitting: Physician Assistant

## 2012-10-30 DIAGNOSIS — D649 Anemia, unspecified: Secondary | ICD-10-CM

## 2012-10-31 ENCOUNTER — Other Ambulatory Visit: Payer: Self-pay | Admitting: Physician Assistant

## 2012-10-31 DIAGNOSIS — R790 Abnormal level of blood mineral: Secondary | ICD-10-CM

## 2012-10-31 DIAGNOSIS — D649 Anemia, unspecified: Secondary | ICD-10-CM

## 2012-11-01 ENCOUNTER — Encounter: Payer: Self-pay | Admitting: Physician Assistant

## 2012-11-25 ENCOUNTER — Encounter: Payer: Self-pay | Admitting: Physician Assistant

## 2012-12-09 ENCOUNTER — Ambulatory Visit (INDEPENDENT_AMBULATORY_CARE_PROVIDER_SITE_OTHER): Payer: BC Managed Care – PPO | Admitting: Physician Assistant

## 2012-12-09 DIAGNOSIS — Z111 Encounter for screening for respiratory tuberculosis: Secondary | ICD-10-CM

## 2012-12-09 NOTE — Progress Notes (Signed)
  Subjective:    Patient ID: Darlene Grant, female    DOB: [birth year], 37 y.o.   MRN: [id number]  HPI    Review of Systems     Objective:   Physical Exam        Assessment & Plan:  PPD given today. Read in 48-72 hours. Tandy Gaw PA-C

## 2012-12-11 LAB — TB SKIN TEST
Induration: 0 mm
TB Skin Test: NEGATIVE

## 2013-01-03 NOTE — Telephone Encounter (Signed)
Pt.notified

## 2013-01-03 NOTE — Telephone Encounter (Signed)
Ok to mail letter.

## 2013-01-06 ENCOUNTER — Encounter: Payer: Self-pay | Admitting: Physician Assistant

## 2013-02-24 ENCOUNTER — Other Ambulatory Visit: Payer: Self-pay

## 2013-02-24 DIAGNOSIS — Z1231 Encounter for screening mammogram for malignant neoplasm of breast: Secondary | ICD-10-CM

## 2013-03-17 LAB — OB RESULTS CONSOLE ANTIBODY SCREEN: Antibody Screen: NEGATIVE

## 2013-03-17 LAB — OB RESULTS CONSOLE GC/CHLAMYDIA
CHLAMYDIA, DNA PROBE: NEGATIVE
GC PROBE AMP, GENITAL: NEGATIVE

## 2013-03-17 LAB — OB RESULTS CONSOLE RPR: RPR: NONREACTIVE

## 2013-03-17 LAB — OB RESULTS CONSOLE HIV ANTIBODY (ROUTINE TESTING): HIV: NONREACTIVE

## 2013-03-17 LAB — OB RESULTS CONSOLE RUBELLA ANTIBODY, IGM: RUBELLA: IMMUNE

## 2013-03-17 LAB — OB RESULTS CONSOLE ABO/RH: RH Type: POSITIVE

## 2013-03-17 LAB — OB RESULTS CONSOLE HEPATITIS B SURFACE ANTIGEN: HEP B S AG: NEGATIVE

## 2013-03-20 ENCOUNTER — Ambulatory Visit: Payer: BC Managed Care – PPO

## 2013-04-24 ENCOUNTER — Other Ambulatory Visit: Payer: Self-pay

## 2013-09-17 ENCOUNTER — Inpatient Hospital Stay (HOSPITAL_COMMUNITY)
Admission: AD | Admit: 2013-09-17 | Payer: BC Managed Care – PPO | Source: Ambulatory Visit | Admitting: Obstetrics and Gynecology

## 2013-10-09 LAB — OB RESULTS CONSOLE GBS: STREP GROUP B AG: NEGATIVE

## 2013-11-03 ENCOUNTER — Encounter: Payer: Self-pay | Admitting: *Deleted

## 2013-11-19 ENCOUNTER — Inpatient Hospital Stay (HOSPITAL_COMMUNITY)
Admission: RE | Admit: 2013-11-19 | Discharge: 2013-11-22 | DRG: 765 | Disposition: A | Discharge status: Home / Self Care | Payer: BC Managed Care – PPO | Source: Ambulatory Visit | Attending: Obstetrics and Gynecology | Admitting: Obstetrics and Gynecology

## 2013-11-19 ENCOUNTER — Encounter (HOSPITAL_COMMUNITY): Payer: Self-pay

## 2013-11-19 DIAGNOSIS — D649 Anemia, unspecified: Secondary | ICD-10-CM | POA: Diagnosis present

## 2013-11-19 DIAGNOSIS — O9902 Anemia complicating childbirth: Secondary | ICD-10-CM | POA: Diagnosis present

## 2013-11-19 DIAGNOSIS — O4290 Premature rupture of membranes, unspecified as to length of time between rupture and onset of labor, unspecified weeks of gestation: Secondary | ICD-10-CM | POA: Diagnosis present

## 2013-11-19 DIAGNOSIS — O480 Post-term pregnancy: Principal | ICD-10-CM | POA: Diagnosis present

## 2013-11-19 DIAGNOSIS — Z6834 Body mass index (BMI) 34.0-34.9, adult: Secondary | ICD-10-CM

## 2013-11-19 DIAGNOSIS — E669 Obesity, unspecified: Secondary | ICD-10-CM | POA: Diagnosis present

## 2013-11-19 DIAGNOSIS — O34219 Maternal care for unspecified type scar from previous cesarean delivery: Secondary | ICD-10-CM | POA: Diagnosis present

## 2013-11-19 DIAGNOSIS — O411090 Infection of amniotic sac and membranes, unspecified, unspecified trimester, not applicable or unspecified: Secondary | ICD-10-CM | POA: Diagnosis present

## 2013-11-19 DIAGNOSIS — O09519 Supervision of elderly primigravida, unspecified trimester: Secondary | ICD-10-CM | POA: Diagnosis present

## 2013-11-19 DIAGNOSIS — O411290 Chorioamnionitis, unspecified trimester, not applicable or unspecified: Secondary | ICD-10-CM | POA: Diagnosis not present

## 2013-11-19 DIAGNOSIS — O99214 Obesity complicating childbirth: Secondary | ICD-10-CM

## 2013-11-19 LAB — CBC WITH DIFFERENTIAL/PLATELET
BASOS ABS: 0 10*3/uL (ref 0.0–0.1)
BASOS PCT: 0 % (ref 0–1)
Eosinophils Absolute: 0 10*3/uL (ref 0.0–0.7)
Eosinophils Relative: 0 % (ref 0–5)
HEMATOCRIT: 34.6 % — AB (ref 36.0–46.0)
Hemoglobin: 12 g/dL (ref 12.0–15.0)
LYMPHS PCT: 16 % (ref 12–46)
Lymphs Abs: 2 10*3/uL (ref 0.7–4.0)
MCH: 32.3 pg (ref 26.0–34.0)
MCHC: 34.7 g/dL (ref 30.0–36.0)
MCV: 93.3 fL (ref 78.0–100.0)
MONO ABS: 0.6 10*3/uL (ref 0.1–1.0)
Monocytes Relative: 5 % (ref 3–12)
Neutro Abs: 9.8 10*3/uL — ABNORMAL HIGH (ref 1.7–7.7)
Neutrophils Relative %: 79 % — ABNORMAL HIGH (ref 43–77)
Platelets: 195 10*3/uL (ref 150–400)
RBC: 3.71 MIL/uL — ABNORMAL LOW (ref 3.87–5.11)
RDW: 14.4 % (ref 11.5–15.5)
WBC: 12.4 10*3/uL — ABNORMAL HIGH (ref 4.0–10.5)

## 2013-11-19 MED ORDER — LACTATED RINGERS IV SOLN
INTRAVENOUS | Status: DC
  Administered 2013-11-19 – 2013-11-20 (×2): via INTRAVENOUS

## 2013-11-19 MED ORDER — ONDANSETRON HCL 4 MG/2ML IJ SOLN
4.0000 mg | Freq: Four times a day (QID) | INTRAMUSCULAR | Status: DC | PRN
  Administered 2013-11-20 (×2): 4 mg via INTRAVENOUS
  Filled 2013-11-19 (×2): qty 2

## 2013-11-19 MED ORDER — LIDOCAINE HCL (PF) 1 % IJ SOLN: 30.0000 mL | INTRAMUSCULAR | Status: DC | PRN

## 2013-11-19 MED ORDER — CITRIC ACID-SODIUM CITRATE 334-500 MG/5ML PO SOLN
30.0000 mL | ORAL | Status: DC | PRN
  Filled 2013-11-19: qty 15

## 2013-11-19 MED ORDER — IBUPROFEN 600 MG PO TABS: 600.0000 mg | ORAL_TABLET | Freq: Four times a day (QID) | ORAL | Status: DC | PRN

## 2013-11-19 MED ORDER — OXYTOCIN 40 UNITS IN LACTATED RINGERS INFUSION - SIMPLE MED
1.0000 m[IU]/min | INTRAVENOUS | Status: DC
  Administered 2013-11-19: 1 m[IU]/min via INTRAVENOUS
  Administered 2013-11-20: 2 m[IU]/min via INTRAVENOUS
  Filled 2013-11-19: qty 1000

## 2013-11-19 MED ORDER — NALBUPHINE HCL 10 MG/ML IJ SOLN: 10.0000 mg | INTRAMUSCULAR | Status: DC | PRN

## 2013-11-19 MED ORDER — OXYTOCIN BOLUS FROM INFUSION: 500.0000 mL | INTRAVENOUS | Status: DC

## 2013-11-19 MED ORDER — ACETAMINOPHEN 325 MG PO TABS: 650.0000 mg | ORAL_TABLET | ORAL | Status: DC | PRN

## 2013-11-19 MED ORDER — TERBUTALINE SULFATE 1 MG/ML IJ SOLN: 0.2500 mg | Freq: Once | INTRAMUSCULAR | Status: AC | PRN

## 2013-11-19 MED ORDER — OXYTOCIN 40 UNITS IN LACTATED RINGERS INFUSION - SIMPLE MED: 62.5000 mL/h | INTRAVENOUS | Status: DC

## 2013-11-19 MED ORDER — DIPHENHYDRAMINE HCL 25 MG PO CAPS
50.0000 mg | ORAL_CAPSULE | Freq: Once | ORAL | Status: AC
  Administered 2013-11-19: 50 mg via ORAL
  Filled 2013-11-19: qty 2

## 2013-11-19 MED ORDER — LACTATED RINGERS IV SOLN: 500.0000 mL | INTRAVENOUS | Status: DC | PRN

## 2013-11-19 NOTE — Progress Notes (Signed)
Advised patient clear liquid diet only patient stated she does not agree with diet plan and would like to eat when she wants patient is aware clear liquid diet is for her safety during labor and has been ordered by the midwife.

## 2013-11-19 NOTE — Progress Notes (Signed)
  Subjective: Discussed Dr. Theone Stanley recommendation to start ampicillin considering >18hrs since ROM.  Patient informed of risk of chorioamnionitis for herself and baby as well as how infection does not allow her to have waterbirth.    Patient expresses desire to have low intervention birth and states she would like to "chew" on this option for awhile. Patient also refusing LR infusion and informed of need for low infusion to kvo. Patient okay with this, but states she still wants to hydrate orally, which is okay.    Objective: BP 118/68  Pulse 113  Temp(Src) 98.4 F (36.9 C) (Oral)  Resp 16  Ht 5\' 4"  (1.626 m)  Wt 202 lb (91.627 kg)  BMI 34.66 kg/m2     FHT:  150 bpm, Mod Var, -Decels, +Accels UC:   Refuses toco, none palpated SVE:   Dilation: Closed Effacement (%): 70 Station: -3 Exam by:: Amreen Raczkowski CNM  Membranes:SROM at 0230 Pitocin: None   Assessment:  IUP at 41.1wks Cat I FT  Post Dates SROM >18hrs  Plan: -Insert IV and draw labs as ordered. -Ampicillin recommended for infection prophylaxis  -Patient to discuss with husband and doula and update staff. -Continue other mgmt as ordered  Geanie Kenning, MSN 11/19/2013, 10:26 PM   2315 Patient declines ampicillin for prophylaxis. Discussed risks of infection and informed that once chorioamnionitis diagnosed, waterbirth is no longer a birthing option. Patient verbalized understanding and states that she will consider at later time, if temperature increases. Patient preparing to go to sleep. Will continue mgmt as ordered.   J.Rameses Ou, CNM, MSN

## 2013-11-19 NOTE — H&P (Signed)
Darlene Grant is a 38 y.o. female, G1P0 at 41.1 weeks, presenting for labor augmentation.  Patient reports that she had SROM of clear fluid on 11/19/13 at 0230.  Patient states that contractions have been infrequent and mild.  Reports active fetus and denies VB.  Desires WB.  Patient Active Problem List   Diagnosis Date Noted  . Anemia 07/05/2012  . Ligament tear 07/05/2012    History of present pregnancy: Patient entered care at 10.6 weeks.   EDC of 11/11/2013 was established by Definite LMP on 02/04/2013.   Anatomy scan:  18.6 weeks, with placenta previa noted on anterior placenta.   Additional [place] evaluations:  22.6wks for previa f/u, which was resolved at that time.   Significant prenatal events:  None    Last evaluation:  11/18/2013: 1/30/-4  OB History   Grav Para Term Preterm Abortions TAB SAB Ect Mult Living   0              Past Medical History  Diagnosis Date  . Breast pain, left   . Rectal itching   . Candida vaginitis   . Abnormal Pap smear     ASCUS 03/13/11   Past Surgical History  Procedure Laterality Date  . Pilonidal cyst excision  2000  . Wisdom tooth extraction  AGE 66  . Hand surgery  04/2012    ligament repair   Family History: family history includes Cancer in her maternal grandfather and maternal grandmother; Cancer (age of onset: 30) in her mother. Social History:  reports that she has never smoked. She has never used smokeless tobacco. She reports that she drinks alcohol. She reports that she does not use illicit drugs.   Prenatal Transfer Tool  Maternal Diabetes: No Genetic Screening: Normal Maternal Ultrasounds/Referrals: Abnormal:  Findings:   Other:Previa-resolved at 22wks Fetal Ultrasounds or other Referrals:  None Maternal Substance Abuse:  No Significant Maternal Medications:  Meds include: Other:  Ca+ Mg+D, Claritin, Fluticasone NS, Folic Acid, Iron, Multivitamin, Vit C, Probiotic Significant Maternal Lab Results: Lab values include:  Group B Strep negative    ROS:  See HPI Above  Allergies  Allergen Reactions  . Augmentin [Amoxicillin-Pot Clavulanate]     diarrhea  . Hydrocodone Itching       There were no vitals taken for this visit.  Chest clear Heart RRR without murmur Abd gravid, NT Pelvic: closed/70/-3 Ext: WNL  FHR: 150s UCs:  Refuses toco  Prenatal labs: ABO, Rh:   A Positive Antibody:  Negative Rubella:   Immune RPR:   Negative HBsAg:   Negative-03/17/13 HIV:   Negative GBS:  Negative Sickle cell/Hgb electrophoresis:  N/A Pap:  WNL GC:  Neg Chlamydia:  Neg Genetic screenings:  Normal Glucola:  Negative Other:  Ferritin-WNL    Assessment IUP at 41.1wks S-PROM at [place]: 0905: Patient arrive for augmentation of labor after S-PROM at 0230. Continues to have intermittent contractions q10-15 minutes that are mild. Reports active fetus and denies VB. Patient currently declining LR infusion and wants to discuss continuous monitoring with husband and doula. Request that provider return for cervical exam and further interventions.   Admit to [facility] per consult with Dr. Theone Stanley Routine Labor and Delivery Orders Low-Dose Pitocin throughout the night for Augmentation   Ardeen Jourdain, MSN 11/19/2013, 8:05 PM

## 2013-11-20 ENCOUNTER — Encounter (HOSPITAL_COMMUNITY): Payer: Self-pay

## 2013-11-20 ENCOUNTER — Inpatient Hospital Stay (HOSPITAL_COMMUNITY): Payer: BC Managed Care – PPO | Admitting: Anesthesiology

## 2013-11-20 ENCOUNTER — Encounter (HOSPITAL_COMMUNITY): Payer: BC Managed Care – PPO | Admitting: Anesthesiology

## 2013-11-20 LAB — RPR

## 2013-11-20 MED ORDER — LIDOCAINE HCL (PF) 1 % IJ SOLN
INTRAMUSCULAR | Status: DC | PRN
  Administered 2013-11-20 (×2): 4 mL

## 2013-11-20 MED ORDER — AMPICILLIN-SULBACTAM SODIUM 3 (2-1) G IJ SOLR
3.0000 g | Freq: Four times a day (QID) | INTRAMUSCULAR | Status: DC
  Administered 2013-11-20: 3 g via INTRAVENOUS
  Filled 2013-11-20 (×4): qty 3

## 2013-11-20 MED ORDER — FENTANYL 2.5 MCG/ML BUPIVACAINE 1/10 % EPIDURAL INFUSION (WH - ANES)
INTRAMUSCULAR | Status: AC
  Filled 2013-11-20: qty 125

## 2013-11-20 MED ORDER — PHENYLEPHRINE 40 MCG/ML (10ML) SYRINGE FOR IV PUSH (FOR BLOOD PRESSURE SUPPORT): 80.0000 ug | PREFILLED_SYRINGE | INTRAVENOUS | Status: DC | PRN

## 2013-11-20 MED ORDER — FENTANYL CITRATE 0.05 MG/ML IJ SOLN
100.0000 ug | Freq: Once | INTRAMUSCULAR | Status: AC
  Administered 2013-11-20: 100 ug via INTRAVENOUS

## 2013-11-20 MED ORDER — LACTATED RINGERS IV SOLN: 500.0000 mL | Freq: Once | INTRAVENOUS | Status: DC

## 2013-11-20 MED ORDER — ACETAMINOPHEN 650 MG RE SUPP
650.0000 mg | Freq: Once | RECTAL | Status: AC
  Administered 2013-11-20: 650 mg via RECTAL
  Filled 2013-11-20: qty 1

## 2013-11-20 MED ORDER — DIPHENHYDRAMINE HCL 50 MG/ML IJ SOLN: 12.5000 mg | INTRAMUSCULAR | Status: DC | PRN

## 2013-11-20 MED ORDER — FENTANYL 2.5 MCG/ML BUPIVACAINE 1/10 % EPIDURAL INFUSION (WH - ANES)
14.0000 mL/h | INTRAMUSCULAR | Status: DC | PRN
  Administered 2013-11-20: 14 mL/h via EPIDURAL
  Filled 2013-11-20: qty 125

## 2013-11-20 MED ORDER — EPHEDRINE 5 MG/ML INJ
10.0000 mg | INTRAVENOUS | Status: DC | PRN
Start: 2013-11-20 — End: 2013-11-21

## 2013-11-20 MED ORDER — FENTANYL 2.5 MCG/ML BUPIVACAINE 1/10 % EPIDURAL INFUSION (WH - ANES)
INTRAMUSCULAR | Status: DC | PRN
  Administered 2013-11-20: 14 mL/h via EPIDURAL

## 2013-11-20 MED ORDER — EPHEDRINE 5 MG/ML INJ
INTRAVENOUS | Status: AC
  Filled 2013-11-20: qty 4

## 2013-11-20 MED ORDER — OXYTOCIN 40 UNITS IN LACTATED RINGERS INFUSION - SIMPLE MED: 1.0000 m[IU]/min | INTRAVENOUS | Status: DC

## 2013-11-20 MED ORDER — EPHEDRINE 5 MG/ML INJ: 10.0000 mg | INTRAVENOUS | Status: DC | PRN

## 2013-11-20 MED ORDER — FENTANYL CITRATE 0.05 MG/ML IJ SOLN
INTRAMUSCULAR | Status: AC
  Filled 2013-11-20: qty 2

## 2013-11-20 MED ORDER — PHENYLEPHRINE 40 MCG/ML (10ML) SYRINGE FOR IV PUSH (FOR BLOOD PRESSURE SUPPORT)
PREFILLED_SYRINGE | INTRAVENOUS | Status: AC
  Filled 2013-11-20: qty 10

## 2013-11-20 NOTE — Anesthesia Procedure Notes (Signed)
Epidural Patient location during procedure: OB Start time: 11/20/2013 4:22 PM  Staffing Anesthesiologist: Violett Hobbs A. Performed by: anesthesiologist   Preanesthetic Checklist Completed: patient identified, site marked, surgical consent, pre-op evaluation, timeout performed, IV checked, risks and benefits discussed and monitors and equipment checked  Epidural Patient position: sitting Prep: site prepped and draped and DuraPrep Patient monitoring: continuous pulse ox and blood pressure Approach: midline Location: L3-L4 Injection technique: LOR air  Needle:  Needle type: Tuohy  Needle gauge: 17 G Needle length: 9 cm and 9 Needle insertion depth: 5 cm cm Catheter type: closed end flexible Catheter size: 19 Gauge Catheter at skin depth: 10 cm Test dose: negative and Other  Assessment Events: blood not aspirated, injection not painful, no injection resistance, negative IV test and no paresthesia  Additional Notes Patient identified. Risks and benefits discussed including failed block, incomplete  Pain control, post dural puncture headache, nerve damage, paralysis, blood pressure Changes, nausea, vomiting, reactions to medications-both toxic and allergic and post Partum back pain. All questions were answered. Patient expressed understanding and wished to proceed. Sterile technique was used throughout procedure. Epidural site was Dressed with sterile barrier dressing. No paresthesias, signs of intravascular injection Or signs of intrathecal spread were encountered.  Patient was more comfortable after the epidural was dosed. Please see RN's note for documentation of vital signs and FHR which are stable.

## 2013-11-20 NOTE — Progress Notes (Signed)
  Subjective: Breathing with contractions, still q 5-6 min.  Husband and doula present.  Objective: BP 112/75  Pulse 95  Temp(Src) 98.5 F (36.9 C) (Oral)  Resp 16  Ht 5\' 4"  (1.626 m)  Wt 202 lb (91.627 kg)  BMI 34.66 kg/m2      FHT: Category 1 UC:   regular, every 5-6 minutes SVE:   Dilation: 1 Effacement (%): 100 Station: -1 Exam by:: Shaylon Aden  Pitocin at 2 mu/min  Assessment:  IUP at 41 2/7 weeks PPROM x 31 hours GBS negative  Plan: Increase pitocin slowly per patient request. Continue current care.  Donnel Saxon CNM, MS 11/20/2013, 9:54 AM

## 2013-11-20 NOTE — Progress Notes (Signed)
  Subjective:  Patient up to bathroom.  Reports that she is coping well with contractions. States that she is having back labor and feels need for position changes to help rotate baby.   Objective: BP 115/67  Pulse 83  Temp(Src) 98.6 F (37 C) (Oral)  Resp 16  Ht 5\' 4"  (1.626 m)  Wt 202 lb (91.627 kg)  BMI 34.66 kg/m2     FHT:  150bpm, Mod Var, -Decels, +Accels UC:   Q 80min, moderate to palpation SVE:   Deferred Membranes: SROM at 0230 11/19/13 Pitocin: 7mUn/min  Assessment:  IUP at 41.2wks Cat I FT  Pitocin Augmentation SROM>24hrs Afebrile  Plan: -Continue present mgmt -Position changes to promote maternal comfort and coping with contractions -Will reassess prn -Dr. Florene Route updated  Geanie Kenning, MSN 11/20/2013, 3:37 AM

## 2013-11-20 NOTE — Progress Notes (Signed)
  Subjective: Comfortable with epidural.  Objective: BP 115/44  Pulse 81  Temp(Src) 97.9 F (36.6 C) (Oral)  Resp 20  Ht 5\' 4"  (1.626 m)  Wt 202 lb (91.627 kg)  BMI 34.66 kg/m2  SpO2 99%      FHT: Category 1 UC:   irregular, every 2-4 minutes SVE:   4 cm, 80%, -1 station--narrowing of pelvis at ischial spines Pitocin at 4 cm IUPC inserted without difficulty--fluid in catheter appears lightly meconium stained. Foley inserted without difficulty  Assessment:  Early labor PROM x 40 hours GBS negative Light MSF  Plan: Continue to monitor quality of contractions, augment to achieve adequacy. Close monitoring of maternal/fetal status. Discussed use of IUPC and pitocin, uncertainty of final outcome of C/S vs. vaginal delivery. Will work with positioning to facilitate rotation and descent. Support to patient for hopes for vaginal delivery.   Donnel Saxon CNM, MN 11/20/2013, 6:32 PM

## 2013-11-20 NOTE — Progress Notes (Signed)
  Subjective: Exhausted--considering pain med or epidural.  Has been in and out of tub, ambulating, and in bed on hands and knees.  Objective: BP 115/75  Pulse 91  Temp(Src) 98.5 F (36.9 C) (Oral)  Resp 20  Ht 5\' 4"  (1.626 m)  Wt 202 lb (91.627 kg)  BMI 34.66 kg/m2  SpO2 100%      FHT: Category 1 UC:   regular, every 3 minutes SVE:   Dilation: 3.5 Effacement (%): 100 Station: -2 Exam by:: Rain Wil[place] cnm + show. Pitocin at 3 mu/min  Assessment:  Progressive labor Prolonged ROM x 38 hours GBS negative  Plan: Reviewed options for management--patient requested epidural. Will proceed with placement and re-evaluate after that.  Donnel Saxon CNM, MN 11/20/2013, 3:45p

## 2013-11-20 NOTE — Anesthesia Preprocedure Evaluation (Signed)
Anesthesia Evaluation  Patient identified by MRN, date of birth, ID band Patient awake    Reviewed: Allergy & Precautions, H&P , Patient's Chart, lab work & pertinent test results  Airway Mallampati: III TM Distance: >3 FB Neck ROM: Full    Dental no notable dental hx. (+) Teeth Intact   Pulmonary neg pulmonary ROS,  breath sounds clear to auscultation  Pulmonary exam normal       Cardiovascular negative cardio ROS  Rhythm:Regular Rate:Normal     Neuro/Psych negative neurological ROS  negative psych ROS   GI/Hepatic negative GI ROS, Neg liver ROS,   Endo/Other  Obesity  Renal/GU negative Renal ROS  negative genitourinary   Musculoskeletal negative musculoskeletal ROS (+)   Abdominal (+) + obese,   Peds  Hematology  (+) anemia ,   Anesthesia Other Findings   Reproductive/Obstetrics (+) Pregnancy PROM                           Anesthesia Physical Anesthesia Plan  ASA: II  Anesthesia Plan: Epidural   Post-op Pain Management:    Induction:   Airway Management Planned: Natural Airway  Additional Equipment:   Intra-op Plan:   Post-operative Plan:   Informed Consent: I have reviewed the patients History and Physical, chart, labs and discussed the procedure including the risks, benefits and alternatives for the proposed anesthesia with the patient or authorized representative who has indicated his/her understanding and acceptance.     Plan Discussed with: Anesthesiologist  Anesthesia Plan Comments:         Anesthesia Quick Evaluation

## 2013-11-20 NOTE — Progress Notes (Addendum)
  Subjective: Feeling very shaky, feeling vaginal pressure.  Objective: BP 113/84  Pulse 102  Temp(Src) 97.9 F (36.6 C) (Oral)  Resp 18  Ht 5\' 4"  (1.626 m)  Wt 202 lb (91.627 kg)  BMI 34.66 kg/m2  SpO2 99%      FHT: Category 1 UC:   q 3 min, MVUs 160-180 SVE:   Dilation: 6.5 Effacement (%): 100 Station: 0;-1 Exam by:: v. [place], cnm Bloody show noted Pitocin on 9 mu/min  Assessment:  Progressive labor GBS negative Afebrile, but feels warm to touch  Plan: Continue pitocin augmentation Close observation of maternal/fetal status. Watch temp.  Donnel Saxon CNM, MN 11/20/2013, 8:38 PM

## 2013-11-20 NOTE — Progress Notes (Signed)
  Subjective: Sitting in rocking chair--reports UCs stronger this am.  Declined ATB prophylaxis--agreeable with ATB if s/s of chorio occur.  Objective: BP 112/75  Pulse 95  Temp(Src) 98.7 F (37.1 C) (Oral)  Resp 16  Ht 5\' 4"  (1.626 m)  Wt 202 lb (91.627 kg)  BMI 34.66 kg/m2      Filed Vitals:   11/20/13 0026 11/20/13 0218 11/20/13 0407 11/20/13 0623  BP: 121/69 115/67 104/65 112/75  Pulse: 89 83 97 95  Temp: 99 F (37.2 C) 98.6 F (37 C) 98.4 F (36.9 C) 98.7 F (37.1 C)  TempSrc: Oral Oral Oral Oral  Resp:  16 16   Height:      Weight:         FHT: Category 1 UC:   regular, every 5-6 minutes SVE:   Deferred at present  Pitocin at 2 mu/min since 0217  Assessment:  IUP at 41 2/7 weeks Premature ROM at term, now prolonged x 29 hours. GBS negative Latent phase labor--on augmentation  Plan: Reviewed status of PROM and latent phase labor with patient, husband [name]), and doula Grayland Ormond).  Discussed increasing pitocin to facilitate efficient labor, ATB if s/s of chorio arise, FHR monitoring, judicious use of VE for assessment. Patient agreeable with increasing pitocin--would like light breakfast and opportunity to shower/freshen up prior to beginning to increase pitocin. Also agreeable to VE this am.  Will check when patient ready for pitocin to be increased.   All questions reviewed, support offered to patient birth goals, including appropriateness of selected interventions to facilitate vaginal delivery.  Donnel Saxon CNM, [place] 11/20/2013, 7:37 AM

## 2013-11-20 NOTE — Progress Notes (Signed)
  Subjective: Comfortable with epidural--feeling some vaginal pressure.  Objective: BP 123/78  Pulse 103  Temp(Src) 100.2 F (37.9 C) (Oral)  Resp 18  Ht 5\' 4"  (1.626 m)  Wt 202 lb (91.627 kg)  BMI 34.66 kg/m2  SpO2 99%      FHT: Category 1 UC:   regular, every 2 minutes SVE:   Deferred at present Pitocin at 11 mu/min MVUs 215 since 2200  Assessment:  PROM x 44 hours Maternal temp Adequate labor  Plan: Consulted with Dr. Cletis Media Unasyn 3 gm IV q 6 hours Tylenol supp now IV bolus Continue augmetation  Darlene Grant CNM, MN 11/20/2013, 10:19 PM

## 2013-11-20 NOTE — Progress Notes (Addendum)
  Subjective: More uncomfortable with contractions.  On hands and knees in bed, alternating positions.  Ready for use of tub.  Objective: BP 124/82  Pulse 115  Temp(Src) 98.5 F (36.9 C) (Oral)  Resp 16  Ht 5\' 4"  (1.626 m)  Wt 202 lb (91.627 kg)  BMI 34.66 kg/m2     FHT: Category 1 UC:   regular, every 2-3 minutes on [employer] monitor SVE: Deferred at present    Pitocin at 3 mu/min  Assessment:  PROM at term x 46 1/2 hours Progressive labor  Plan: Continue current care. Tub use prn Continuous EFM.  Donnel Saxon CNM, MN 11/20/2013, 12:52 PM

## 2013-11-21 ENCOUNTER — Encounter (HOSPITAL_COMMUNITY)
Admission: RE | Disposition: A | Discharge status: Home / Self Care | Payer: Self-pay | Source: Ambulatory Visit | Attending: Obstetrics and Gynecology

## 2013-11-21 ENCOUNTER — Encounter (HOSPITAL_COMMUNITY): Payer: Self-pay

## 2013-11-21 DIAGNOSIS — O34219 Maternal care for unspecified type scar from previous cesarean delivery: Secondary | ICD-10-CM | POA: Diagnosis present

## 2013-11-21 DIAGNOSIS — O411290 Chorioamnionitis, unspecified trimester, not applicable or unspecified: Secondary | ICD-10-CM | POA: Diagnosis not present

## 2013-11-21 LAB — CBC
HCT: 25.9 % — ABNORMAL LOW (ref 36.0–46.0)
Hemoglobin: 8.7 g/dL — ABNORMAL LOW (ref 12.0–15.0)
MCH: 31.4 pg (ref 26.0–34.0)
MCHC: 33.6 g/dL (ref 30.0–36.0)
MCV: 93.5 fL (ref 78.0–100.0)
Platelets: 192 10*3/uL (ref 150–400)
RBC: 2.77 MIL/uL — AB (ref 3.87–5.11)
RDW: 14.2 % (ref 11.5–15.5)
WBC: 24.7 10*3/uL — ABNORMAL HIGH (ref 4.0–10.5)

## 2013-11-21 LAB — FERRITIN: Ferritin: 31 ng/mL (ref 10–291)

## 2013-11-21 SURGERY — Surgical Case
Anesthesia: Epidural | Site: Abdomen

## 2013-11-21 MED ORDER — KETOROLAC TROMETHAMINE 30 MG/ML IJ SOLN: 30.0000 mg | Freq: Four times a day (QID) | INTRAMUSCULAR | Status: AC | PRN

## 2013-11-21 MED ORDER — MEPERIDINE HCL 25 MG/ML IJ SOLN
INTRAMUSCULAR | Status: AC
  Filled 2013-11-21: qty 1

## 2013-11-21 MED ORDER — SENNOSIDES-DOCUSATE SODIUM 8.6-50 MG PO TABS
2.0000 | ORAL_TABLET | ORAL | Status: DC
  Filled 2013-11-21: qty 2

## 2013-11-21 MED ORDER — WITCH HAZEL-GLYCERIN EX PADS: 1.0000 "application " | MEDICATED_PAD | CUTANEOUS | Status: DC | PRN

## 2013-11-21 MED ORDER — MORPHINE SULFATE (PF) 0.5 MG/ML IJ SOLN
INTRAMUSCULAR | Status: DC | PRN
  Administered 2013-11-21: 4 mg via EPIDURAL

## 2013-11-21 MED ORDER — DIPHENHYDRAMINE HCL 25 MG PO CAPS
25.0000 mg | ORAL_CAPSULE | ORAL | Status: DC | PRN
  Administered 2013-11-21: 25 mg via ORAL
  Filled 2013-11-21: qty 1

## 2013-11-21 MED ORDER — SIMETHICONE 80 MG PO CHEW
80.0000 mg | CHEWABLE_TABLET | ORAL | Status: DC
  Filled 2013-11-21: qty 1

## 2013-11-21 MED ORDER — SCOPOLAMINE 1 MG/3DAYS TD PT72
MEDICATED_PATCH | TRANSDERMAL | Status: AC
  Filled 2013-11-21: qty 1

## 2013-11-21 MED ORDER — OXYTOCIN 10 UNIT/ML IJ SOLN
INTRAMUSCULAR | Status: AC
Start: 2013-11-21 — End: 2013-11-21
  Filled 2013-11-21: qty 4

## 2013-11-21 MED ORDER — ONDANSETRON HCL 4 MG/2ML IJ SOLN
INTRAMUSCULAR | Status: AC
  Filled 2013-11-21: qty 2

## 2013-11-21 MED ORDER — MEPERIDINE HCL 25 MG/ML IJ SOLN: 6.2500 mg | INTRAMUSCULAR | Status: DC | PRN

## 2013-11-21 MED ORDER — IBUPROFEN 600 MG PO TABS
600.0000 mg | ORAL_TABLET | Freq: Four times a day (QID) | ORAL | Status: DC
  Administered 2013-11-21 – 2013-11-22 (×5): 600 mg via ORAL
  Filled 2013-11-21 (×5): qty 1

## 2013-11-21 MED ORDER — SIMETHICONE 80 MG PO CHEW: 80.0000 mg | CHEWABLE_TABLET | ORAL | Status: DC | PRN

## 2013-11-21 MED ORDER — NALBUPHINE HCL 10 MG/ML IJ SOLN: 5.0000 mg | INTRAMUSCULAR | Status: DC | PRN

## 2013-11-21 MED ORDER — DIPHENHYDRAMINE HCL 50 MG/ML IJ SOLN: 25.0000 mg | INTRAMUSCULAR | Status: DC | PRN

## 2013-11-21 MED ORDER — DEXAMETHASONE SODIUM PHOSPHATE 10 MG/ML IJ SOLN
INTRAMUSCULAR | Status: AC
  Filled 2013-11-21: qty 1

## 2013-11-21 MED ORDER — SIMETHICONE 80 MG PO CHEW
80.0000 mg | CHEWABLE_TABLET | Freq: Three times a day (TID) | ORAL | Status: DC
  Administered 2013-11-21 – 2013-11-22 (×3): 80 mg via ORAL
  Filled 2013-11-21 (×4): qty 1

## 2013-11-21 MED ORDER — ONDANSETRON HCL 4 MG/2ML IJ SOLN
INTRAMUSCULAR | Status: DC | PRN
  Administered 2013-11-21: 4 mg via INTRAVENOUS

## 2013-11-21 MED ORDER — ONDANSETRON HCL 4 MG/2ML IJ SOLN: 4.0000 mg | INTRAMUSCULAR | Status: DC | PRN

## 2013-11-21 MED ORDER — FERROUS SULFATE 325 (65 FE) MG PO TABS
325.0000 mg | ORAL_TABLET | Freq: Two times a day (BID) | ORAL | Status: DC
  Administered 2013-11-21 – 2013-11-22 (×4): 325 mg via ORAL
  Filled 2013-11-21 (×3): qty 1

## 2013-11-21 MED ORDER — TETANUS-DIPHTH-ACELL PERTUSSIS 5-2.5-18.5 LF-MCG/0.5 IM SUSP: 0.5000 mL | Freq: Once | INTRAMUSCULAR | Status: DC

## 2013-11-21 MED ORDER — ZOLPIDEM TARTRATE 5 MG PO TABS: 5.0000 mg | ORAL_TABLET | Freq: Every evening | ORAL | Status: DC | PRN

## 2013-11-21 MED ORDER — LACTATED RINGERS IV SOLN
INTRAVENOUS | Status: DC
  Administered 2013-11-21: 11:00:00 via INTRAVENOUS

## 2013-11-21 MED ORDER — BUPIVACAINE HCL (PF) 0.25 % IJ SOLN
INTRAMUSCULAR | Status: DC | PRN
  Administered 2013-11-21: 20 mL

## 2013-11-21 MED ORDER — SODIUM CHLORIDE 0.9 % IV SOLN
3.0000 g | Freq: Four times a day (QID) | INTRAVENOUS | Status: AC
  Administered 2013-11-21 (×4): 3 g via INTRAVENOUS
  Filled 2013-11-21 (×5): qty 3

## 2013-11-21 MED ORDER — FENTANYL CITRATE 0.05 MG/ML IJ SOLN: 25.0000 ug | INTRAMUSCULAR | Status: DC | PRN

## 2013-11-21 MED ORDER — ONDANSETRON HCL 4 MG PO TABS: 4.0000 mg | ORAL_TABLET | ORAL | Status: DC | PRN

## 2013-11-21 MED ORDER — LACTATED RINGERS IV SOLN
INTRAVENOUS | Status: DC | PRN
  Administered 2013-11-21 (×4): via INTRAVENOUS

## 2013-11-21 MED ORDER — METOCLOPRAMIDE HCL 5 MG/ML IJ SOLN: 10.0000 mg | Freq: Three times a day (TID) | INTRAMUSCULAR | Status: DC | PRN

## 2013-11-21 MED ORDER — LANOLIN HYDROUS EX OINT: 1.0000 "application " | TOPICAL_OINTMENT | CUTANEOUS | Status: DC | PRN

## 2013-11-21 MED ORDER — KETOROLAC TROMETHAMINE 60 MG/2ML IM SOLN
60.0000 mg | Freq: Once | INTRAMUSCULAR | Status: AC | PRN
  Administered 2013-11-21: 60 mg via INTRAMUSCULAR

## 2013-11-21 MED ORDER — MEASLES, MUMPS & RUBELLA VAC ~~LOC~~ INJ
0.5000 mL | INJECTION | Freq: Once | SUBCUTANEOUS | Status: DC
  Filled 2013-11-21: qty 0.5

## 2013-11-21 MED ORDER — DEXAMETHASONE SODIUM PHOSPHATE 10 MG/ML IJ SOLN
INTRAMUSCULAR | Status: DC | PRN
  Administered 2013-11-21: 10 mg via INTRAVENOUS

## 2013-11-21 MED ORDER — SODIUM CHLORIDE 0.9 % IJ SOLN: 3.0000 mL | INTRAMUSCULAR | Status: DC | PRN

## 2013-11-21 MED ORDER — TRAMADOL HCL 50 MG PO TABS: 50.0000 mg | ORAL_TABLET | Freq: Four times a day (QID) | ORAL | Status: DC | PRN

## 2013-11-21 MED ORDER — FENTANYL CITRATE 0.05 MG/ML IJ SOLN
INTRAMUSCULAR | Status: AC
  Filled 2013-11-21: qty 2

## 2013-11-21 MED ORDER — KETOROLAC TROMETHAMINE 60 MG/2ML IM SOLN
INTRAMUSCULAR | Status: AC
  Filled 2013-11-21: qty 2

## 2013-11-21 MED ORDER — OXYTOCIN 40 UNITS IN LACTATED RINGERS INFUSION - SIMPLE MED: 62.5000 mL/h | INTRAVENOUS | Status: AC

## 2013-11-21 MED ORDER — DIBUCAINE 1 % RE OINT: 1.0000 "application " | TOPICAL_OINTMENT | RECTAL | Status: DC | PRN

## 2013-11-21 MED ORDER — LIDOCAINE-EPINEPHRINE (PF) 2 %-1:200000 IJ SOLN
INTRAMUSCULAR | Status: AC
  Filled 2013-11-21: qty 20

## 2013-11-21 MED ORDER — DIPHENHYDRAMINE HCL 25 MG PO CAPS: 25.0000 mg | ORAL_CAPSULE | Freq: Four times a day (QID) | ORAL | Status: DC | PRN

## 2013-11-21 MED ORDER — LACTATED RINGERS IV SOLN
INTRAVENOUS | Status: DC | PRN
  Administered 2013-11-21: 01:00:00 via INTRAVENOUS

## 2013-11-21 MED ORDER — MEPERIDINE HCL 25 MG/ML IJ SOLN
INTRAMUSCULAR | Status: DC | PRN
  Administered 2013-11-21 (×2): 12.5 mg via INTRAVENOUS

## 2013-11-21 MED ORDER — ONDANSETRON HCL 4 MG/2ML IJ SOLN: 4.0000 mg | Freq: Three times a day (TID) | INTRAMUSCULAR | Status: DC | PRN

## 2013-11-21 MED ORDER — BUPIVACAINE HCL (PF) 0.25 % IJ SOLN
INTRAMUSCULAR | Status: AC
  Filled 2013-11-21: qty 30

## 2013-11-21 MED ORDER — SCOPOLAMINE 1 MG/3DAYS TD PT72
1.0000 | MEDICATED_PATCH | Freq: Once | TRANSDERMAL | Status: DC
  Administered 2013-11-21: 1.5 mg via TRANSDERMAL

## 2013-11-21 MED ORDER — METOCLOPRAMIDE HCL 5 MG/ML IJ SOLN: 10.0000 mg | Freq: Once | INTRAMUSCULAR | Status: DC | PRN

## 2013-11-21 MED ORDER — SODIUM BICARBONATE 8.4 % IV SOLN
INTRAVENOUS | Status: DC | PRN
  Administered 2013-11-21 (×2): 5 mL via EPIDURAL

## 2013-11-21 MED ORDER — MENTHOL 3 MG MT LOZG: 1.0000 | LOZENGE | OROMUCOSAL | Status: DC | PRN

## 2013-11-21 MED ORDER — MORPHINE SULFATE (PF) 0.5 MG/ML IJ SOLN
INTRAMUSCULAR | Status: DC | PRN
  Administered 2013-11-21: 1 mg via INTRAVENOUS

## 2013-11-21 MED ORDER — 0.9 % SODIUM CHLORIDE (POUR BTL) OPTIME
TOPICAL | Status: DC | PRN
  Administered 2013-11-21: 1000 mL

## 2013-11-21 MED ORDER — METHYLERGONOVINE MALEATE 0.2 MG/ML IJ SOLN: 0.2000 mg | INTRAMUSCULAR | Status: DC | PRN

## 2013-11-21 MED ORDER — SODIUM BICARBONATE 8.4 % IV SOLN
INTRAVENOUS | Status: AC
  Filled 2013-11-21: qty 50

## 2013-11-21 MED ORDER — OXYTOCIN 10 UNIT/ML IJ SOLN
40.0000 [IU] | INTRAVENOUS | Status: DC | PRN
  Administered 2013-11-21: 40 [IU] via INTRAVENOUS

## 2013-11-21 MED ORDER — DEXTROSE 5 % IV SOLN
1.0000 ug/kg/h | INTRAVENOUS | Status: DC | PRN
  Filled 2013-11-21: qty 2

## 2013-11-21 MED ORDER — BUPIVACAINE-EPINEPHRINE (PF) 0.25% -1:200000 IJ SOLN
INTRAMUSCULAR | Status: AC
  Filled 2013-11-21: qty 30

## 2013-11-21 MED ORDER — CEFAZOLIN SODIUM-DEXTROSE 2-3 GM-% IV SOLR
2.0000 g | Freq: Once | INTRAVENOUS | Status: AC
  Administered 2013-11-21: 2 g via INTRAVENOUS
  Filled 2013-11-21: qty 50

## 2013-11-21 MED ORDER — DIPHENHYDRAMINE HCL 50 MG/ML IJ SOLN: 12.5000 mg | INTRAMUSCULAR | Status: DC | PRN

## 2013-11-21 MED ORDER — FENTANYL CITRATE 0.05 MG/ML IJ SOLN
INTRAMUSCULAR | Status: DC | PRN
  Administered 2013-11-21: 100 ug via EPIDURAL

## 2013-11-21 MED ORDER — NALOXONE HCL 0.4 MG/ML IJ SOLN: 0.4000 mg | INTRAMUSCULAR | Status: DC | PRN

## 2013-11-21 MED ORDER — PRENATAL MULTIVITAMIN CH
1.0000 | ORAL_TABLET | Freq: Every day | ORAL | Status: DC
  Administered 2013-11-21 – 2013-11-22 (×2): 1 via ORAL
  Filled 2013-11-21 (×2): qty 1

## 2013-11-21 MED ORDER — MORPHINE SULFATE 0.5 MG/ML IJ SOLN
INTRAMUSCULAR | Status: AC
  Filled 2013-11-21: qty 10

## 2013-11-21 MED ORDER — METHYLERGONOVINE MALEATE 0.2 MG PO TABS: 0.2000 mg | ORAL_TABLET | ORAL | Status: DC | PRN

## 2013-11-21 SURGICAL SUPPLY — 34 items
BENZOIN TINCTURE PRP APPL 2/3 (GAUZE/BANDAGES/DRESSINGS) ×2 IMPLANT
BOOTIES KNEE HIGH SLOAN (MISCELLANEOUS) ×4 IMPLANT
CLAMP CORD UMBIL (MISCELLANEOUS) IMPLANT
CLOTH BEACON ORANGE TIMEOUT ST (SAFETY) ×2 IMPLANT
DRAIN JACKSON PRT FLT 10 (DRAIN) IMPLANT
DRAPE LG THREE QUARTER DISP (DRAPES) IMPLANT
DRSG OPSITE POSTOP 4X10 (GAUZE/BANDAGES/DRESSINGS) ×2 IMPLANT
DURAPREP 26ML APPLICATOR (WOUND CARE) ×2 IMPLANT
ELECT REM PT RETURN 9FT ADLT (ELECTROSURGICAL) ×2
ELECTRODE REM PT RTRN 9FT ADLT (ELECTROSURGICAL) ×1 IMPLANT
EVACUATOR SILICONE 100CC (DRAIN) IMPLANT
EXTRACTOR VACUUM M CUP 4 TUBE (SUCTIONS) IMPLANT
GLOVE BIOGEL PI IND STRL 7.0 (GLOVE) ×1 IMPLANT
GLOVE BIOGEL PI INDICATOR 7.0 (GLOVE) ×1
GLOVE ECLIPSE 6.5 STRL STRAW (GLOVE) ×2 IMPLANT
GOWN STRL REUS W/TWL LRG LVL3 (GOWN DISPOSABLE) ×4 IMPLANT
KIT ABG SYR 3ML LUER SLIP (SYRINGE) IMPLANT
NEEDLE HYPO 22GX1.5 SAFETY (NEEDLE) ×2 IMPLANT
NEEDLE HYPO 25X5/8 SAFETYGLIDE (NEEDLE) IMPLANT
NS IRRIG 1000ML POUR BTL (IV SOLUTION) ×4 IMPLANT
PACK C SECTION WH (CUSTOM PROCEDURE TRAY) ×2 IMPLANT
PAD OB MATERNITY 4.3X12.25 (PERSONAL CARE ITEMS) ×2 IMPLANT
RTRCTR C-SECT PINK 25CM LRG (MISCELLANEOUS) ×2 IMPLANT
STRIP CLOSURE SKIN 1/2X4 (GAUZE/BANDAGES/DRESSINGS) ×2 IMPLANT
SUT CHROMIC GUT AB #0 18 (SUTURE) IMPLANT
SUT MNCRL AB 3-0 PS2 27 (SUTURE) ×2 IMPLANT
SUT SILK 2 0 FSL 18 (SUTURE) IMPLANT
SUT VIC AB 0 CTX 36 (SUTURE) ×3
SUT VIC AB 0 CTX36XBRD ANBCTRL (SUTURE) ×3 IMPLANT
SUT VIC AB 1 CT1 36 (SUTURE) ×4 IMPLANT
SYR 20CC LL (SYRINGE) ×2 IMPLANT
TOWEL OR 17X24 6PK STRL BLUE (TOWEL DISPOSABLE) ×2 IMPLANT
TRAY FOLEY CATH 14FR (SET/KITS/TRAYS/PACK) ×2 IMPLANT
WATER STERILE IRR 1000ML POUR (IV SOLUTION) ×2 IMPLANT

## 2013-11-21 NOTE — Addendum Note (Signed)
Addendum created 11/21/13 1426 by Garner Nash, CRNA   Modules edited: Notes Section   Notes Section:  File: [id number]

## 2013-11-21 NOTE — Progress Notes (Signed)
Pt refuses for pulse on to be put on finger and when on toe scd maching makes it go off constantly.  Refuses to do incentive spirometer

## 2013-11-21 NOTE — Anesthesia Postprocedure Evaluation (Signed)
  Anesthesia Post-op Note  Anesthesia Post Note  Patient: Darlene Grant  Procedure(s) Performed: Procedure(s) (LRB): CESAREAN SECTION (N/A)  Anesthesia type: Epidural  Patient location: PACU  Post pain: Pain level controlled  Post assessment: Post-op Vital signs reviewed  Last Vitals:  Filed Vitals:   11/21/13 0345  BP: 112/71  Pulse: 96  Temp: 37.1 C  Resp: 20    Post vital signs: stable  Level of consciousness: awake  Complications: No apparent anesthesia complications

## 2013-11-21 NOTE — Progress Notes (Addendum)
  Subjective: Shaking at times, feeling vaginal pressure.  Objective: BP 112/61  Pulse 76  Temp(Src) 102.7 F (39.3 C) (Oral)  Resp 18  Ht 5\' 4"  (1.626 m)  Wt 202 lb (91.627 kg)  BMI 34.66 kg/m2  SpO2 99%   Total I/O In: -  Out: 650 [Urine:650]  FHT: Category 1 UC:   regular, every 2-3 minutes SVE:   Dilation: 7 Effacement (%): 100 Station: 0 Exam by:: V Gary Bultman  Asynclitic, with molding, more cervix on right than left. No descent of widest diameter of vtx beyond ischial spines. Pitocin at 11 mu/min MVUs 200+ since 10pm  1st dose Unsyn at 2154 Tylenol supp 2150   Assessment:  Chorioamnionitis Prolonged ROM x 45.5 hours Likely arrest of active labor  Plan: Consulted with Dr. Cletis Media.  C/S recommended for above indications. Patient consented for C/S.  R&B reviewed, including bleeding, infection, and damage to other organs.  Patient and husband seem to understand these risks and are agreeable with proceeding with C/S. Ancef 2 gm IV.  Donnel Saxon CNM, MN 11/21/2013, 12:03 AM

## 2013-11-21 NOTE — Transfer of Care (Signed)
Immediate Anesthesia Transfer of Care Note  Patient: Darlene Grant  Procedure(s) Performed: Procedure(s): CESAREAN SECTION (N/A)  Patient Location: PACU  Anesthesia Type:Epidural  Level of Consciousness: awake  Airway & Oxygen Therapy: Patient Spontanous Breathing  Post-op Assessment: Report given to PACU RN and Post -op Vital signs reviewed and stable  Post vital signs: stable  Complications: No apparent anesthesia complications

## 2013-11-21 NOTE — Progress Notes (Signed)
Subjective: Postpartum Day 0: Cesarean Delivery due to Failure to Progress & Chorioamnionitis Patient remains in bed with foley catheter in place. Reports fatigue.  Oriented, but displaying incoherent and disorganized speech. Behavior is appropriate. Mood is stable, affect euphoric. Patient denies recent pain medication or current pain.  Feeding:  Breast Contraceptive plan:  Not discussed. Discussed current HgB level with husband and need for Fe+supplemenation.  Offered liquid preparation if desired.  Also discussed need for patient to perform IS and ambulate after adequate rest. Husband verbalized understanding regarding patient's needs and additions to POC.   Objective: Vital signs in last 24 hours: Temp:  [97.8 F (36.6 C)-102.7 F (39.3 C)] 98 F (36.7 C) (06/05 0630) Pulse Rate:  [70-121] 89 (06/05 0630) Resp:  [11-31] 18 (06/05 0630) BP: (95-134)/(44-90) 107/58 mmHg (06/05 0630) SpO2:  [93 %-100 %] 95 % (06/05 0630)  Physical Exam:  General: delirious, fatigued and no distress Lochia: appropriate Uterine Fundus: firm, U/-2 Abdomen: Soft , non tender, BS not assessed Incision: Honeycomb dressing CDI DVT Evaluation: Not assessed JP drain:   N/A   Recent Labs  11/19/13 2230 11/21/13 0615  HGB 12.0 8.7*  HCT 34.6* 25.9*  WBC 12.4* 24.7*    Assessment/Plan: S/P Primary C/S-Day 0 Stable Anemia  Delirious  Fe+ supplementation Rest encouraged Reassess prn   Gavin Pound CNM, MSN 11/21/2013, 8:06 AM

## 2013-11-21 NOTE — Op Note (Signed)
Preoperative diagnosis: Intrauterine pregnancy at 41 weeks and 3 days with failure to progress and chorioamnionitis  Post operative diagnosis: Same  Anesthesia: Epidural  Anesthesiologist: Dr. Assunta Gambles  Procedure: Primary low transverse cesarean section  Surgeon: Dr. Katharine Look Othella Slappey  Assistant: Donnel Saxon CNM  Estimated blood loss: 800 cc  Procedure:  After being informed of the planned procedure and possible complications including bleeding, infection, injury to other organs, informed consent is obtained. The patient is taken to OR #9 and pre-existing epidural anesthesia was optimized  without complication. She is placed in the dorsal decubitus position with the pelvis tilted to the left. She is then prepped and draped in a sterile fashion. A Foley catheter is  in her bladder.  After assessing adequate level of anesthesia, we infiltrate the suprapubic area with 20 cc of Marcaine 0.25 and perform a Pfannenstiel incision which is brought down sharply to the fascia. The fascia is entered in a low transverse fashion. Linea alba is dissected. Peritoneum is entered in a midline fashion. An Alexis retractor is easily positioned.   The myometrium is then entered in a low transverse fashion, 2 cm above the vesico-uterine junction ; first with knife and then extended bluntly. Amniotic fluid is meconium stained. We assist the birth of a female  infant in vertex presentation. Mouth and nose are suctioned. The baby is delivered. The cord is clamped and sectioned. The baby is given to the neonatologist present in the room.  10 cc of blood is drawn from the umbilical vein.The placenta is allowed to deliver spontaneously. It is complete and the cord has 3 vessels. Uterine revision is negative.  We proceed with closure of the myometrium in 2 layers: First with a running locked suture of 0 Vicryl, then with a Lembert suture of 0 Vicryl imbricating the first one. Hemostasis is completed with cauterization  on peritoneal edges and a figure-of-eight stitch of 0-Vicryl on the right angle.  Both paracolic gutters are cleaned. Both tubes and ovaries are assessed and normal. The pelvis is profusely irrigated with warm saline to confirm a satisfactory hemostasis.  Retractors and sponges are removed. Under fascia hemostasis is completed with cauterization. The fascia is then closed with 2 running sutures of 0 Vicryl meeting midline. The wound is irrigated with warm saline and hemostasis is completed with cauterization. The skin is closed with a subcuticular suture of 3-0 Monocryl and Steri-Strips.  Instrument and sponge count is complete x2. Estimated blood loss is 800 cc.  The procedure is well tolerated by the patient who is taken to recovery room in a well and stable condition.  female baby  was born at 00:50 and received an Apgar of 9  at 1 minute and 9 at 5 minutes.    Specimen: Placenta sent to pathology.  Dede Query Special Ranes MD 6/5/20151:29 AM

## 2013-11-21 NOTE — Lactation Note (Signed)
This note was copied from the chart of [place]. Lactation Consultation Note Refused my services, waiting on Dr. Linus Mako wife for lactation to come and see her. No offense to our [facility] but she wanted consistency. RN reported her nipples were flat and baby couldn't latch. Pt. Allowed me to assess them and I noted bruising all over both nipples where the baby sucked but wasn't able to latch. Hand pump given to help pull out nipples and mom could give colostrum if she wanted. Mom too hand pump but didn't want a curve tip syring to give any colostrum until Mrs. Bodie arrived. Nipples are flat w/bouncy areolas. Unable to latch baby d/t not compressible to get into baby's mouth. Dr. Donna Christen in house and notified of status. Reviewed Baby & Me book's Breastfeeding Basics. Mom encouraged to feed baby w/feeding cuesHand expression taught to Mom. [place] brochure given w/resources, support groups and [facility] services.Referred to Baby and Me Book in Breastfeeding section Pg. 22-23 for position options and Proper latch demonstration. Patient Name: Darlene Grant ELFYB'O Date: 11/21/2013     Maternal Data    Feeding    LATCH Score/Interventions                      Lactation Tools Discussed/Used     Consult Status      Theodoro Kalata 11/21/2013, 6:19 AM

## 2013-11-21 NOTE — Anesthesia Postprocedure Evaluation (Signed)
  Anesthesia Post-op Note  Patient: Darlene Grant  Procedure(s) Performed: Procedure(s): CESAREAN SECTION (N/A)  Patient Location: Mother/Baby  Anesthesia Type:Epidural  Level of Consciousness: awake and alert   Airway and Oxygen Therapy: Patient Spontanous Breathing  Post-op Pain: mild  Post-op Assessment: Post-op Vital signs reviewed, Patient's Cardiovascular Status Stable, Respiratory Function Stable, No signs of Nausea or vomiting, Pain level controlled, No headache, No residual numbness and No residual motor weakness  Post-op Vital Signs: Reviewed  Last Vitals:  Filed Vitals:   11/21/13 1300  BP: 106/55  Pulse: 99  Temp: 36.8 C  Resp: 20    Complications: No apparent anesthesia complications

## 2013-11-21 NOTE — Lactation Note (Signed)
This note was copied from the chart of [place]. Lactation Consultation Note  Patient Name: Darlene Grant Today's Date: 11/21/2013 Reason for consult: Initial assessment of this primipara and her newborn, now 32 hours postpartum. Baby breastfed in PACU, spent 40 minutes STS and has had several more 10 minute feedings since then, as well as first void and stool.   Mom has already received two visits today from her private [place], Henreitta Leber, [facility] and denies any current breastfeeding concerns.   LC provided [employer] [facility] brochure and reviewed [facility] services and list of community and web site resources.   Maternal Data Formula Feeding for Exclusion: No Infant to breast within first hour of birth: Yes Has patient been taught Hand Expression?: Yes (documented by her nurse and mom seen by private [facility]) Does the patient have breastfeeding experience prior to this delivery?: No  Feeding Feeding Type: Breast Fed  LATCH Score/Interventions            Initial LATCH score=7 after delivery          Lactation Tools Discussed/Used   Mom has private [facility]; denies need for hospital [facility] services at this time  Consult Status Consult Status: PRN    Landis Gandy 11/21/2013, 7:05 PM

## 2013-11-22 MED ORDER — TRAMADOL HCL 50 MG PO TABS: 50.0000 mg | ORAL_TABLET | Freq: Four times a day (QID) | ORAL | Status: DC | PRN

## 2013-11-22 MED ORDER — IBUPROFEN 600 MG PO TABS: 600.0000 mg | ORAL_TABLET | Freq: Four times a day (QID) | ORAL | Status: DC

## 2013-11-22 NOTE — Discharge Summary (Signed)
Cesarean Section Delivery Discharge Summary  Darlene Grant  DOB:    October 11, 1975 MRN:    [id number] CSN:    [id number]  Date of admission:                  11/19/13  Date of discharge:                   11/22/13  Procedures this admission: C/S for FTP and Chorio after Augmentation for SROM/PROM  Date of Delivery: 11/21/13 by Dr Cletis Media  Newborn Data:  Live born female  Birth Weight: 9 lb 4.8 oz (4218 g) APGAR: 9, 9  Home with mother Circumcision Plan: Done in hospital  History of Present Illness:  Ms. Darlene Grant is a 38 y.o. female, G1P1001, who presents at [redacted]w[redacted]d weeks gestation. The patient has been followed at the [facility] and Gynecology division of [facility].    Her pregnancy has been complicated by:  Patient Active Problem List   Diagnosis Date Noted  . Cesarean delivery delivered 11/21/2013  . 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 PROM (premature rupture of membranes) 11/19/2013  . Anemia 07/05/2012  . Ligament tear 07/05/2012     Hospital course:  The patient was admitted for PROM/SROM.   Her postpartum course was not complicated.  She was discharged to home on postpartum day 1 doing well.  Feeding:  breast  Contraception:  condoms  Discharge hemoglobin:  Hemoglobin  Date Value Ref Range Status  11/21/2013 8.7* 12.0 - 15.0 g/dL Final     DELTA CHECK NOTED     REPEATED TO VERIFY     HCT  Date Value Ref Range Status  11/21/2013 25.9* 36.0 - 46.0 % Final    Discharge Physical Exam:   General: alert, cooperative and no distress Lochia: appropriate Uterine Fundus: firm Incision: healing well DVT Evaluation: No evidence of DVT seen on physical exam. Negative Homan's sign.  Intrapartum Procedures: cesarean: low cervical, transverse Postpartum Procedures: antibiotics Complications-Operative and Postpartum: none  Discharge Diagnoses: Term Pregnancy-delivered, Amnionitis, Post-date pregnancy and  asymptomatic anemia  Discharge Information:  Activity:           pelvic rest Diet:                routine Medications: PNV, Ibuprofen and Iron Condition:      stable Instructions:  Care After Cesarean Delivery  Refer to this sheet in the next few weeks. These instructions provide you with information on caring for yourself after your procedure. Your caregiver may also give you specific instructions. Your treatment has been planned according to current medical practices, but problems sometimes occur. Call your caregiver if you have any problems or questions after you go home. HOME CARE INSTRUCTIONS  Only take over-the-counter or prescription medicines as directed by your caregiver.  Do not drink alcohol, especially if you are breastfeeding or taking medicine to relieve pain.  Do not chew or smoke tobacco.  Continue to use good perineal care. Good perineal care includes:  Wiping your perineum from front to back.  Keeping your perineum clean.  Check your cut (incision) daily for increased redness, drainage, swelling, or separation of skin.  Clean your incision gently with soap and water every day, and then pat it dry. If your caregiver says it is okay, leave the incision uncovered. Use a bandage (dressing) if the incision is draining fluid or appears irritated. If the adhesive strips across the incision do not fall off within 7 days, carefully  peel them off.  Hug a pillow when coughing or sneezing until your incision is healed. This helps to relieve pain.  Do not use tampons or douche until your caregiver says it is okay.  Shower, wash your hair, and take tub baths as directed by your caregiver.  Wear a well-fitting bra that provides breast support.  Limit wearing support panties or control-top hose.  Drink enough fluids to keep your urine clear or pale yellow.  Eat high-fiber foods such as whole grain cereals and breads, brown rice, beans, and fresh fruits and vegetables  every day. These foods may help prevent or relieve constipation.  Resume activities such as climbing stairs, driving, lifting, exercising, or traveling as directed by your caregiver.  Talk to your caregiver about resuming sexual activities. This is dependent upon your risk of infection, your rate of healing, and your comfort and desire to resume sexual activity.  Try to have someone help you with your household activities and your newborn for at least a few days after you leave the hospital.  Rest as much as possible. Try to rest or take a nap when your newborn is sleeping.  Increase your activities gradually.  Keep all of your scheduled postpartum appointments. It is very important to keep your scheduled follow-up appointments. At these appointments, your caregiver will be checking to make sure that you are healing physically and emotionally. SEEK MEDICAL CARE IF:   You are passing large clots from your vagina. Save any clots to show your caregiver.  You have a foul smelling discharge from your vagina.  You have trouble urinating.  You are urinating frequently.  You have pain when you urinate.  You have a change in your bowel movements.  You have increasing redness, pain, or swelling near your incision.  You have pus draining from your incision.  Your incision is separating.  You have painful, hard, or reddened breasts.  You have a severe headache.  You have blurred vision or see spots.  You feel sad or depressed.  You have thoughts of hurting yourself or your newborn.  You have questions about your care, the care of your newborn, or medicines.  You are dizzy or lightheaded.  You have a rash.  You have pain, redness, or swelling at the site of the removed intravenous access (IV) tube.  You have nausea or vomiting.  You stopped breastfeeding and have not had a menstrual period within 12 weeks of stopping.  You are not breastfeeding and have not had a menstrual  period within 12 weeks of delivery.  You have a fever. SEEK IMMEDIATE MEDICAL CARE IF:  You have persistent pain.  You have chest pain.  You have shortness of breath.  You faint.  You have leg pain.  You have stomach pain.  Your vaginal bleeding saturates 2 or more sanitary pads in 1 hour. MAKE SURE YOU:   Understand these instructions.  Will watch your condition.  Will get help right away if you are not doing well or get worse. Document Released: 02/25/2002 Document Revised: 02/28/2012 Document Reviewed: 01/31/2012 [facility] Patient Information 2014 [place].   Postpartum Depression and Baby Blues  The postpartum period begins right after the birth of a baby. During this time, there is often a great amount of joy and excitement. It is also a time of considerable changes in the life of the parent(s). Regardless of how many times a mother gives birth, each child brings new challenges and dynamics to the family.  It is not unusual to have feelings of excitement accompanied by confusing shifts in moods, emotions, and thoughts. All mothers are at risk of developing postpartum depression or the "baby blues." These mood changes can occur right after giving birth, or they may occur many months after giving birth. The baby blues or postpartum depression can be mild or severe. Additionally, postpartum depression can resolve rather quickly, or it can be a long-term condition. CAUSES Elevated hormones and their rapid decline are thought to be a main cause of postpartum depression and the baby blues. There are a number of hormones that radically change during and after pregnancy. Estrogen and progesterone usually decrease immediately after delivering your baby. The level of thyroid hormone and various cortisol steroids also rapidly drop. Other factors that play a major role in these changes include major life events and genetics.  RISK FACTORS If you have any of the following risks for  the baby blues or postpartum depression, know what symptoms to watch out for during the postpartum period. Risk factors that may increase the likelihood of getting the baby blues or postpartum depression include:  Havinga personal or family history of depression.  Having depression while being pregnant.  Having premenstrual or oral contraceptive-associated mood issues.  Having exceptional life stress.  Having marital conflict.  Lacking a social support network.  Having a baby with special needs.  Having health problems such as diabetes. SYMPTOMS Baby blues symptoms include:  Brief fluctuations in mood, such as going from extreme happiness to sadness.  Decreased concentration.  Difficulty sleeping.  Crying spells, tearfulness.  Irritability.  Anxiety. Postpartum depression symptoms typically begin within the first month after giving birth. These symptoms include:  Difficulty sleeping or excessive sleepiness.  Marked weight loss.  Agitation.  Feelings of worthlessness.  Lack of interest in activity or food. Postpartum psychosis is a very concerning condition and can be dangerous. Fortunately, it is rare. Displaying any of the following symptoms is cause for immediate medical attention. Postpartum psychosis symptoms include:  Hallucinations and delusions.  Bizarre or disorganized behavior.  Confusion or disorientation. DIAGNOSIS  A diagnosis is made by an evaluation of your symptoms. There are no medical or lab tests that lead to a diagnosis, but there are various questionnaires that a caregiver may use to identify those with the baby blues, postpartum depression, or psychosis. Often times, a screening tool called the [place] Postnatal Depression Scale is used to diagnose depression in the postpartum period.  TREATMENT The baby blues usually goes away on its own in 1 to 2 weeks. Social support is often all that is needed. You should be encouraged to get adequate  sleep and rest. Occasionally, you may be given medicines to help you sleep.  Postpartum depression requires treatment as it can last several months or longer if it is not treated. Treatment may include individual or group therapy, medicine, or both to address any social, physiological, and psychological factors that may play a role in the depression. Regular exercise, a healthy diet, rest, and social support may also be strongly recommended.  Postpartum psychosis is more serious and needs treatment right away. Hospitalization is often needed. HOME CARE INSTRUCTIONS  Get as much rest as you can. Nap when the baby sleeps.  Exercise regularly. Some women find yoga and walking to be beneficial.  Eat a balanced and nourishing diet.  Do little things that you enjoy. Have a cup of tea, take a bubble bath, read your favorite magazine, or listen to your  favorite music.  Avoid alcohol.  Ask for help with household chores, cooking, grocery shopping, or running errands as needed. Do not try to do everything.  Talk to people close to you about how you are feeling. Get support from your partner, family members, friends, or other new moms.  Try to stay positive in how you think. Think about the things you are grateful for.  Do not spend a lot of time alone.  Only take medicine as directed by your caregiver.  Keep all your postpartum appointments.  Let your caregiver know if you have any concerns. SEEK MEDICAL CARE IF: You are having a reaction or problems with your medicine. SEEK IMMEDIATE MEDICAL CARE IF:  You have suicidal feelings.  You feel you may harm the baby or someone else. Document Released: 03/09/2004 Document Revised: 08/28/2011 Document Reviewed: 04/11/2011 [facility] Patient Information 2014 [place], [place].  Discharge to: home  Follow-up Information   Follow up with [facility]. Schedule an appointment as soon as possible for a visit in 6 weeks.  (Call with any questions or concerns)    Specialty:  Obstetrics and Gynecology   Contact information:   [address] [id number] [phone]       Linda Hedges 11/22/2013

## 2013-11-22 NOTE — Discharge Instructions (Signed)
Postpartum Care After Cesarean Delivery °After you deliver your newborn (postpartum period), the usual stay in the hospital is 24 72 hours. If there were problems with your labor or delivery, or if you have other medical problems, you might be in the hospital longer.  °While you are in the hospital, you will receive help and instructions on how to care for yourself and your newborn during the postpartum period.  °While you are in the hospital: °· It is normal for you to have pain or discomfort from the incision in your abdomen. Be sure to tell your nurses when you are having pain, where the pain is located, and what makes the pain worse. °· If you are breastfeeding, you may feel uncomfortable contractions of your uterus for a couple of weeks. This is normal. The contractions help your uterus get back to normal size. °· It is normal to have some bleeding after delivery. °· For the first 1 3 days after delivery, the flow is red and the amount may be similar to a period. °· It is common for the flow to start and stop. °· In the first few days, you may pass some small clots. Let your nurses know if you begin to pass large clots or your flow increases. °· Do not  flush blood clots down the toilet before having the nurse look at them. °· During the next 3 10 days after delivery, your flow should become more watery and pink or brown-tinged in color. °· Ten to fourteen days after delivery, your flow should be a small amount of yellowish-white discharge. °· The amount of your flow will decrease over the first few weeks after delivery. Your flow may stop in 6 8 weeks. Most women have had their flow stop by 12 weeks after delivery. °· You should change your sanitary pads frequently. °· Wash your hands thoroughly with soap and water for at least 20 seconds after changing pads, using the toilet, or before holding or feeding your newborn. °· Your intravenous (IV) tubing will be removed when you are drinking enough fluids. °· The  urine drainage tube (urinary catheter) that was inserted before delivery may be removed within 6 8 hours after delivery or when feeling returns to your legs. You should feel like you need to empty your bladder within the first 6 8 hours after the catheter has been removed. °· In case you become weak, lightheaded, or faint, call your nurse before you get out of bed for the first time and before you take a shower for the first time. °· Within the first few days after delivery, your breasts may begin to feel tender and full. This is called engorgement. Breast tenderness usually goes away within 48 72 hours after engorgement occurs. You may also notice milk leaking from your breasts. If you are not breastfeeding, do not stimulate your breasts. Breast stimulation can make your breasts produce more milk. °· Spending as much time as possible with your newborn is very important. During this time, you and your newborn can feel close and get to know each other. Having your newborn stay in your room (rooming in) will help to strengthen the bond with your newborn. It will give you time to get to know your newborn and become comfortable caring for your newborn. °· Your hormones change after delivery. Sometimes the hormone changes can temporarily cause you to feel sad or tearful. These feelings should not last more than a few days. If these feelings last longer   than that, you should talk to your caregiver.  If desired, talk to your caregiver about methods of family planning or contraception.  Talk to your caregiver about immunizations. Your caregiver may want you to have the following immunizations before leaving the hospital:  Tetanus, diphtheria, and pertussis (Tdap) or tetanus and diphtheria (Td) immunization. It is very important that you and your family (including grandparents) or others caring for your newborn are up-to-date with the Tdap or Td immunizations. The Tdap or Td immunization can help protect your newborn  from getting ill.  Rubella immunization.  Varicella (chickenpox) immunization.  Influenza immunization. You should receive this annual immunization if you did not receive the immunization during your pregnancy. Document Released: 02/28/2012 Document Reviewed: 02/28/2012 [facility] Patient Information 2014 [place], [place].  Postpartum Depression and Baby Blues The postpartum period begins right after the birth of a baby. During this time, there is often a great amount of joy and excitement. It is also a time of considerable changes in the life of the parent(s). Regardless of how many times a mother gives birth, each child brings new challenges and dynamics to the family. It is not unusual to have feelings of excitement accompanied by confusing shifts in moods, emotions, and thoughts. All mothers are at risk of developing postpartum depression or the "baby blues." These mood changes can occur right after giving birth, or they may occur many months after giving birth. The baby blues or postpartum depression can be mild or severe. Additionally, postpartum depression can resolve rather quickly, or it can be a long-term condition. CAUSES Elevated hormones and their rapid decline are thought to be a main cause of postpartum depression and the baby blues. There are a number of hormones that radically change during and after pregnancy. Estrogen and progesterone usually decrease immediately after delivering your baby. The level of thyroid hormone and various cortisol steroids also rapidly drop. Other factors that play a major role in these changes include major life events and genetics.  RISK FACTORS If you have any of the following risks for the baby blues or postpartum depression, know what symptoms to watch out for during the postpartum period. Risk factors that may increase the likelihood of getting the baby blues or postpartum depression include:  Havinga personal or family history of  depression.  Having depression while being pregnant.  Having premenstrual or oral contraceptive-associated mood issues.  Having exceptional life stress.  Having marital conflict.  Lacking a social support network.  Having a baby with special needs.  Having health problems such as diabetes. SYMPTOMS Baby blues symptoms include:  Brief fluctuations in mood, such as going from extreme happiness to sadness.  Decreased concentration.  Difficulty sleeping.  Crying spells, tearfulness.  Irritability.  Anxiety. Postpartum depression symptoms typically begin within the first month after giving birth. These symptoms include:  Difficulty sleeping or excessive sleepiness.  Marked weight loss.  Agitation.  Feelings of worthlessness.  Lack of interest in activity or food. Postpartum psychosis is a very concerning condition and can be dangerous. Fortunately, it is rare. Displaying any of the following symptoms is cause for immediate medical attention. Postpartum psychosis symptoms include:  Hallucinations and delusions.  Bizarre or disorganized behavior.  Confusion or disorientation. DIAGNOSIS  A diagnosis is made by an evaluation of your symptoms. There are no medical or lab tests that lead to a diagnosis, but there are various questionnaires that a caregiver may use to identify those with the baby blues, postpartum depression, or psychosis. Often times,  a screening tool called the [place] Postnatal Depression Scale is used to diagnose depression in the postpartum period.  TREATMENT The baby blues usually goes away on its own in 1 to 2 weeks. Social support is often all that is needed. You should be encouraged to get adequate sleep and rest. Occasionally, you may be given medicines to help you sleep.  Postpartum depression requires treatment as it can last several months or longer if it is not treated. Treatment may include individual or group therapy, medicine, or both to  address any social, physiological, and psychological factors that may play a role in the depression. Regular exercise, a healthy diet, rest, and social support may also be strongly recommended.  Postpartum psychosis is more serious and needs treatment right away. Hospitalization is often needed. HOME CARE INSTRUCTIONS  Get as much rest as you can. Nap when the baby sleeps.  Exercise regularly. Some women find yoga and walking to be beneficial.  Eat a balanced and nourishing diet.  Do little things that you enjoy. Have a cup of tea, take a bubble bath, read your favorite magazine, or listen to your favorite music.  Avoid alcohol.  Ask for help with household chores, cooking, grocery shopping, or running errands as needed. Do not try to do everything.  Talk to people close to you about how you are feeling. Get support from your partner, family members, friends, or other new moms.  Try to stay positive in how you think. Think about the things you are grateful for.  Do not spend a lot of time alone.  Only take medicine as directed by your caregiver.  Keep all your postpartum appointments.  Let your caregiver know if you have any concerns. SEEK MEDICAL CARE IF: You are having a reaction or problems with your medicine. SEEK IMMEDIATE MEDICAL CARE IF:  You have suicidal feelings.  You feel you may harm the baby or someone else. Document Released: 03/09/2004 Document Revised: 08/28/2011 Document Reviewed: 03/17/2013 [facility] Patient Information 2014 [place], [place].  Breastfeeding Deciding to breastfeed is one of the best choices you can make for you and your baby. A change in hormones during pregnancy causes your breast tissue to grow and increases the number and size of your milk ducts. These hormones also allow proteins, sugars, and fats from your blood supply to make breast milk in your milk-producing glands. Hormones prevent breast milk from being released before your baby is born  as well as prompt milk flow after birth. Once breastfeeding has begun, thoughts of your baby, as well as his or her sucking or crying, can stimulate the release of milk from your milk-producing glands.  BENEFITS OF BREASTFEEDING For Your Baby  Your first milk (colostrum) helps your baby's digestive system function better.   There are antibodies in your milk that help your baby fight off infections.   Your baby has a lower incidence of asthma, allergies, and sudden infant death syndrome.   The nutrients in breast milk are better for your baby than infant formulas and are designed uniquely for your baby's needs.   Breast milk improves your baby's brain development.   Your baby is less likely to develop other conditions, such as childhood obesity, asthma, or type 2 diabetes mellitus.  For You   Breastfeeding helps to create a very special bond between you and your baby.   Breastfeeding is convenient. Breast milk is always available at the correct temperature and costs nothing.   Breastfeeding helps to burn calories  and helps you lose the weight gained during pregnancy.   Breastfeeding makes your uterus contract to its prepregnancy size faster and slows bleeding (lochia) after you give birth.   Breastfeeding helps to lower your risk of developing type 2 diabetes mellitus, osteoporosis, and breast or ovarian cancer later in life. SIGNS THAT YOUR BABY IS HUNGRY Early Signs of Hunger  Increased alertness or activity.  Stretching.  Movement of the head from side to side.  Movement of the head and opening of the mouth when the corner of the mouth or cheek is stroked (rooting).  Increased sucking sounds, smacking lips, cooing, sighing, or squeaking.  Hand-to-mouth movements.  Increased sucking of fingers or hands. Late Signs of Hunger  Fussing.  Intermittent crying. Extreme Signs of Hunger Signs of extreme hunger will require calming and consoling before your baby  will be able to breastfeed successfully. Do not wait for the following signs of extreme hunger to occur before you initiate breastfeeding:   Restlessness.  A loud, strong cry.   Screaming. BREASTFEEDING BASICS Breastfeeding Initiation  Find a comfortable place to sit or lie down, with your neck and back well supported.  Place a pillow or rolled up blanket under your baby to bring him or her to the level of your breast (if you are seated). Nursing pillows are specially designed to help support your arms and your baby while you breastfeed.  Make sure that your baby's abdomen is facing your abdomen.   Gently massage your breast. With your fingertips, massage from your chest wall toward your nipple in a circular motion. This encourages milk flow. You may need to continue this action during the feeding if your milk flows slowly.  Support your breast with 4 fingers underneath and your thumb above your nipple. Make sure your fingers are well away from your nipple and your baby's mouth.   Stroke your baby's lips gently with your finger or nipple.   When your baby's mouth is open wide enough, quickly bring your baby to your breast, placing your entire nipple and as much of the colored area around your nipple (areola) as possible into your baby's mouth.   More areola should be visible above your baby's upper lip than below the lower lip.   Your baby's tongue should be between his or her lower gum and your breast.   Ensure that your baby's mouth is correctly positioned around your nipple (latched). Your baby's lips should create a seal on your breast and be turned out (everted).  It is common for your baby to suck about 2 3 minutes in order to start the flow of breast milk. Latching Teaching your baby how to latch on to your breast properly is very important. An improper latch can cause nipple pain and decreased milk supply for you and poor weight gain in your baby. Also, if your baby is  not latched onto your nipple properly, he or she may swallow some air during feeding. This can make your baby fussy. Burping your baby when you switch breasts during the feeding can help to get rid of the air. However, teaching your baby to latch on properly is still the best way to prevent fussiness from swallowing air while breastfeeding. Signs that your baby has successfully latched on to your nipple:    Silent tugging or silent sucking, without causing you pain.   Swallowing heard between every 3 4 sucks.    Muscle movement above and in front of his or her  ears while sucking.  Signs that your baby has not successfully latched on to nipple:   Sucking sounds or smacking sounds from your baby while breastfeeding.  Nipple pain. If you think your baby has not latched on correctly, slip your finger into the corner of your baby's mouth to break the suction and place it between your baby's gums. Attempt breastfeeding initiation again. Signs of Successful Breastfeeding Signs from your baby:   A gradual decrease in the number of sucks or complete cessation of sucking.   Falling asleep.   Relaxation of his or her body.   Retention of a small amount of milk in his or her mouth.   Letting go of your breast by himself or herself. Signs from you:  Breasts that have increased in firmness, weight, and size 1 3 hours after feeding.   Breasts that are softer immediately after breastfeeding.  Increased milk volume, as well as a change in milk consistency and color by the 5th day of breastfeeding.   Nipples that are not sore, cracked, or bleeding. Signs That Your [employer] is Getting Enough Milk  Wetting at least 3 diapers in a 24-hour period. The urine should be clear and pale yellow by age 34 days.  At least 3 stools in a 24-hour period by age 34 days. The stool should be soft and yellow.  At least 3 stools in a 24-hour period by age 39 days. The stool should be seedy and yellow.  No  loss of weight greater than 10% of birth weight during the first 11 days of age.  Average weight gain of 4 7 ounces (120 210 mL) per week after age 17 days.  Consistent daily weight gain by age 340 days, without weight loss after the age of 2 weeks. After a feeding, your baby may spit up a small amount. This is common. BREASTFEEDING FREQUENCY AND DURATION Frequent feeding will help you make more milk and can prevent sore nipples and breast engorgement. Breastfeed when you feel the need to reduce the fullness of your breasts or when your baby shows signs of hunger. This is called "breastfeeding on demand." Avoid introducing a pacifier to your baby while you are working to establish breastfeeding (the first 4 6 weeks after your baby is born). After this time you may choose to use a pacifier. Research has shown that pacifier use during the first year of a baby's life decreases the risk of sudden infant death syndrome (SIDS). Allow your baby to feed on each breast as long as he or she wants. Breastfeed until your baby is finished feeding. When your baby unlatches or falls asleep while feeding from the first breast, offer the second breast. Because newborns are often sleepy in the first few weeks of life, you may need to awaken your baby to get him or her to feed. Breastfeeding times will vary from baby to baby. However, the following rules can serve as a guide to help you ensure that your baby is properly fed:  Newborns (babies 71 weeks of age or younger) may breastfeed every 1 3 hours.  Newborns should not go longer than 3 hours during the day or 5 hours during the night without breastfeeding.  You should breastfeed your baby a minimum of 8 times in a 24-hour period until you begin to introduce solid foods to your baby at around 73 months of age. BREAST MILK PUMPING Pumping and storing breast milk allows you to ensure that your baby is exclusively fed  your breast milk, even at times when you are unable to  breastfeed. This is especially important if you are going back to work while you are still breastfeeding or when you are not able to be present during feedings. Your lactation consultant can give you guidelines on how long it is safe to store breast milk.  A breast pump is a machine that allows you to pump milk from your breast into a sterile bottle. The pumped breast milk can then be stored in a refrigerator or freezer. Some breast pumps are operated by hand, while others use electricity. Ask your lactation consultant which type will work best for you. Breast pumps can be purchased, but some hospitals and breastfeeding support groups lease breast pumps on a monthly basis. A lactation consultant can teach you how to hand express breast milk, if you prefer not to use a pump.  CARING FOR YOUR BREASTS WHILE YOU BREASTFEED Nipples can become dry, cracked, and sore while breastfeeding. The following recommendations can help keep your breasts moisturized and healthy:  Avoid using soap on your nipples.   Wear a supportive bra. Although not required, special nursing bras and tank tops are designed to allow access to your breasts for breastfeeding without taking off your entire bra or top. Avoid wearing underwire style bras or extremely tight bras.  Air dry your nipples for 3 48minutes after each feeding.   Use only cotton bra pads to absorb leaked breast milk. Leaking of breast milk between feedings is normal.   Use lanolin on your nipples after breastfeeding. Lanolin helps to maintain your skin's normal moisture barrier. If you use pure lanolin you do not need to wash it off before feeding your baby again. Pure lanolin is not toxic to your baby. You may also hand express a few drops of breast milk and gently massage that milk into your nipples and allow the milk to air dry. In the first few weeks after giving birth, some women experience extremely full breasts (engorgement). Engorgement can make your  breasts feel heavy, warm, and tender to the touch. Engorgement peaks within 3 5 days after you give birth. The following recommendations can help ease engorgement:  Completely empty your breasts while breastfeeding or pumping. You may want to start by applying warm, moist heat (in the shower or with warm water-soaked hand towels) just before feeding or pumping. This increases circulation and helps the milk flow. If your baby does not completely empty your breasts while breastfeeding, pump any extra milk after he or she is finished.  Wear a snug bra (nursing or regular) or tank top for 1 2 days to signal your body to slightly decrease milk production.  Apply ice packs to your breasts, unless this is too uncomfortable for you.  Make sure that your baby is latched on and positioned properly while breastfeeding. If engorgement persists after 48 hours of following these recommendations, contact your health care provider or a Science writer. OVERALL HEALTH CARE RECOMMENDATIONS WHILE BREASTFEEDING  Eat healthy foods. Alternate between meals and snacks, eating 3 of each per day. Because what you eat affects your breast milk, some of the foods may make your baby more irritable than usual. Avoid eating these foods if you are sure that they are negatively affecting your baby.  Drink milk, fruit juice, and water to satisfy your thirst (about 10 glasses a day).   Rest often, relax, and continue to take your prenatal vitamins to prevent fatigue, stress, and anemia.  Continue  breast self-awareness checks.  Avoid chewing and smoking tobacco.  Avoid alcohol and drug use. Some medicines that may be harmful to your baby can pass through breast milk. It is important to ask your health care provider before taking any medicine, including all over-the-counter and prescription medicine as well as vitamin and herbal supplements. It is possible to become pregnant while breastfeeding. If birth control is  desired, ask your health care provider about options that will be safe for your baby. SEEK MEDICAL CARE IF:   You feel like you want to stop breastfeeding or have become frustrated with breastfeeding.  You have painful breasts or nipples.  Your nipples are cracked or bleeding.  Your breasts are red, tender, or warm.  You have a swollen area on either breast.  You have a fever or chills.  You have nausea or vomiting.  You have drainage other than breast milk from your nipples.  Your breasts do not become full before feedings by the 5th day after you give birth.  You feel sad and depressed.  Your baby is too sleepy to eat well.  Your baby is having trouble sleeping.   Your baby is wetting less than 3 diapers in a 24-hour period.  Your baby has less than 3 stools in a 24-hour period.  Your baby's skin or the white part of his or her eyes becomes yellow.   Your baby is not gaining weight by 47 days of age. SEEK IMMEDIATE MEDICAL CARE IF:   Your baby is overly tired (lethargic) and does not want to wake up and feed.  Your baby develops an unexplained fever. Document Released: 06/05/2005 Document Revised: 02/05/2013 Document Reviewed: 11/27/2012 [facility] Patient Information 2014 [place].  Iron-Rich Diet An iron-rich diet contains foods that are good sources of iron. Iron is an important mineral that helps your body produce hemoglobin. Hemoglobin is a protein in red blood cells that carries oxygen to the body's tissues. Sometimes, the iron level in your blood can be low. This may be caused by:  A lack of iron in your diet.  Blood loss.  Times of growth, such as during pregnancy or during a child's growth and development. Low levels of iron can cause a decrease in the number of red blood cells. This can result in iron deficiency anemia. Iron deficiency anemia symptoms include:  Tiredness.  Weakness.  Irritability.  Increased chance of infection. Here are  some recommendations for daily iron intake:  Males older than 36 years of age need 8 mg of iron per day.  Women ages 21 to 43 need 18 mg of iron per day.  Pregnant women need 27 mg of iron per day, and women who are over 34 years of age and breastfeeding need 9 mg of iron per day.  Women over the age of 67 need 8 mg of iron per day. SOURCES OF IRON There are 2 types of iron that are found in food: heme iron and nonheme iron. Heme iron is absorbed by the body better than nonheme iron. Heme iron is found in meat, poultry, and fish. Nonheme iron is found in grains, beans, and vegetables. Heme Iron Sources Food / Iron (mg)  Chicken liver, 3 oz (85 g)/ 10 mg  Beef liver, 3 oz (85 g)/ 5.5 mg  Oysters, 3 oz (85 g)/ 8 mg  Beef, 3 oz (85 g)/ 2 to 3 mg  Shrimp, 3 oz (85 g)/ 2.8 mg  [place], 3 oz (85 g)/ 2 mg  Chicken, 3 oz (85 g) / 1 mg  Fish (tuna, halibut), 3 oz (85 g)/ 1 mg  Pork, 3 oz (85 g)/ 0.9 mg Nonheme Iron Sources Food / Iron (mg)  Ready-to-eat breakfast cereal, iron-fortified / 3.9 to 7 mg  Tofu,  cup / 3.4 mg  Kidney beans,  cup / 2.6 mg  Baked potato with skin / 2.7 mg  Asparagus,  cup / 2.2 mg  Avocado / 2 mg  Dried peaches,  cup / 1.6 mg  Raisins,  cup / 1.5 mg  Soy milk, 1 cup / 1.5 mg  Whole-wheat bread, 1 slice / 1.2 mg  Spinach, 1 cup / 0.8 mg  Broccoli,  cup / 0.6 mg IRON ABSORPTION Certain foods can decrease the body's absorption of iron. Try to avoid these foods and beverages while eating meals with iron-containing foods:  Coffee.  Tea.  Fiber.  Soy. Foods containing vitamin C can help increase the amount of iron your body absorbs from iron sources, especially from nonheme sources. Eat foods with vitamin C along with iron-containing foods to increase your iron absorption. Foods that are high in vitamin C include many fruits and vegetables. Some good sources are:  Fresh orange  juice.  Oranges.  Strawberries.  Mangoes.  Grapefruit.  Red bell peppers.  Green bell peppers.  Broccoli.  Potatoes with skin.  Tomato juice. Document Released: 01/17/2005 Document Revised: 08/28/2011 Document Reviewed: 11/24/2010 [facility] Patient Information 2014 [place], [place].

## 2013-11-24 ENCOUNTER — Encounter (HOSPITAL_COMMUNITY)
Admission: RE | Admit: 2013-11-24 | Discharge: 2013-11-24 | Disposition: A | Discharge status: Home / Self Care | Payer: BC Managed Care – PPO | Source: Ambulatory Visit | Attending: Obstetrics and Gynecology | Admitting: Obstetrics and Gynecology

## 2013-11-24 ENCOUNTER — Encounter (HOSPITAL_COMMUNITY): Payer: Self-pay | Admitting: Obstetrics and Gynecology

## 2013-11-24 DIAGNOSIS — O923 Agalactia: Secondary | ICD-10-CM | POA: Insufficient documentation

## 2013-11-26 ENCOUNTER — Inpatient Hospital Stay (HOSPITAL_COMMUNITY): Payer: BC Managed Care – PPO

## 2014-04-20 ENCOUNTER — Encounter (HOSPITAL_COMMUNITY): Payer: Self-pay | Admitting: Obstetrics and Gynecology

## 2015-01-18 HISTORY — PX: BREAST BIOPSY: SHX20

## 2015-02-04 ENCOUNTER — Other Ambulatory Visit: Payer: Self-pay

## 2015-02-04 DIAGNOSIS — Z803 Family history of malignant neoplasm of breast: Secondary | ICD-10-CM

## 2015-02-04 DIAGNOSIS — Z1231 Encounter for screening mammogram for malignant neoplasm of breast: Secondary | ICD-10-CM

## 2015-02-16 ENCOUNTER — Ambulatory Visit
Admission: RE | Admit: 2015-02-16 | Discharge: 2015-02-16 | Disposition: A | Discharge status: Home / Self Care | Payer: BLUE CROSS/BLUE SHIELD | Source: Ambulatory Visit

## 2015-02-16 DIAGNOSIS — Z803 Family history of malignant neoplasm of breast: Secondary | ICD-10-CM

## 2015-02-16 DIAGNOSIS — Z1231 Encounter for screening mammogram for malignant neoplasm of breast: Secondary | ICD-10-CM

## 2015-02-16 DIAGNOSIS — R921 Mammographic calcification found on diagnostic imaging of breast: Secondary | ICD-10-CM

## 2015-02-19 ENCOUNTER — Other Ambulatory Visit: Payer: Self-pay | Admitting: Physician Assistant

## 2015-02-19 DIAGNOSIS — R921 Mammographic calcification found on diagnostic imaging of breast: Secondary | ICD-10-CM | POA: Insufficient documentation

## 2015-02-24 ENCOUNTER — Other Ambulatory Visit (HOSPITAL_COMMUNITY)
Admission: RE | Admit: 2015-02-24 | Discharge: 2015-02-24 | Disposition: A | Discharge status: Home / Self Care | Payer: BLUE CROSS/BLUE SHIELD | Source: Ambulatory Visit | Attending: Physician Assistant | Admitting: Physician Assistant

## 2015-02-24 ENCOUNTER — Encounter: Payer: Self-pay | Admitting: Physician Assistant

## 2015-02-24 ENCOUNTER — Ambulatory Visit (INDEPENDENT_AMBULATORY_CARE_PROVIDER_SITE_OTHER): Payer: BLUE CROSS/BLUE SHIELD | Admitting: Physician Assistant

## 2015-02-24 VITALS — BP 110/70 | Ht 64.0 in | Wt 144.0 lb

## 2015-02-24 DIAGNOSIS — R921 Mammographic calcification found on diagnostic imaging of breast: Secondary | ICD-10-CM

## 2015-02-24 DIAGNOSIS — Z01419 Encounter for gynecological examination (general) (routine) without abnormal findings: Secondary | ICD-10-CM

## 2015-02-24 DIAGNOSIS — Z0000 Encounter for general adult medical examination without abnormal findings: Secondary | ICD-10-CM | POA: Diagnosis not present

## 2015-02-24 DIAGNOSIS — Z131 Encounter for screening for diabetes mellitus: Secondary | ICD-10-CM

## 2015-02-24 DIAGNOSIS — Z13220 Encounter for screening for lipoid disorders: Secondary | ICD-10-CM

## 2015-02-24 DIAGNOSIS — Z23 Encounter for immunization: Secondary | ICD-10-CM | POA: Diagnosis not present

## 2015-02-24 DIAGNOSIS — Z1151 Encounter for screening for human papillomavirus (HPV): Secondary | ICD-10-CM | POA: Insufficient documentation

## 2015-02-24 MED ORDER — CLOBETASOL PROPIONATE 0.05 % EX CREA
1.0000 | TOPICAL_CREAM | Freq: Two times a day (BID) | CUTANEOUS | Status: DC
Start: 2015-02-24 — End: 2016-06-25

## 2015-02-24 MED ORDER — CYCLOBENZAPRINE HCL 10 MG PO TABS: ORAL_TABLET | ORAL | Status: DC

## 2015-02-24 NOTE — Patient Instructions (Signed)

## 2015-02-25 NOTE — Progress Notes (Signed)
  Subjective:     Darlene Grant is a 39 y.o. female and is here for a comprehensive physical exam. The patient reports no problems.  Social History   Social History  . Marital Status: Married    Spouse Name: N/A  . Number of Children: N/A  . Years of Education: N/A   Occupational History  . Not on file.   Social History Main Topics  . Smoking status: Never Smoker   . Smokeless tobacco: Never Used  . Alcohol Use: Yes  . Drug Use: No  . Sexual Activity: Yes    Birth Control/ Protection: Condom   Other Topics Concern  . Not on file   Social History Narrative   Health Maintenance  Topic Date Due  . PAP SMEAR  03/04/2013  . INFLUENZA VACCINE  01/18/2016  . MAMMOGRAM  02/16/2016  . TETANUS/TDAP  02/23/2025  . HIV Screening  Completed    The following portions of the patient's history were reviewed and updated as appropriate: allergies, current medications, past family history, past medical history, past social history, past surgical history and problem list.  Review of Systems A comprehensive review of systems was negative.   Objective:    General appearance: alert, cooperative and appears stated age Head: Normocephalic, without obvious abnormality, atraumatic Eyes: conjunctivae/corneas clear. PERRL, EOM's intact. Fundi benign. Ears: normal TM's and external ear canals both ears Nose: Nares normal. Septum midline. Mucosa normal. No drainage or sinus tenderness. Throat: lips, mucosa, and tongue normal; teeth and gums normal Neck: no adenopathy, no carotid bruit, no JVD, supple, symmetrical, trachea midline and thyroid not enlarged, symmetric, no tenderness/mass/nodules Back: symmetric, no curvature. ROM normal. No CVA tenderness. Lungs: clear to auscultation bilaterally Breasts: normal appearance, no masses or tenderness Heart: regular rate and rhythm, S1, S2 normal, no murmur, click, rub or gallop Abdomen: soft, non-tender; bowel sounds normal; no masses,   no organomegaly Pelvic: cervix normal in appearance, external genitalia normal, no adnexal masses or tenderness, no cervical motion tenderness, uterus normal size, shape, and consistency and vagina normal without discharge Extremities: extremities normal, atraumatic, no cyanosis or edema Pulses: 2+ and symmetric Skin: Skin color, texture, turgor normal. No rashes or lesions Lymph nodes: Cervical, supraclavicular, and axillary nodes normal. Neurologic: Grossly normal    Assessment:    Healthy female exam.      Plan:    CPE- flu and tdap given. Fasting labs ordered. Pt on vitamin D and calcium. HO given. Mammogram see below. Pap done today. Pt declines STD testing.   Calcification of right breast- diagnostic mammogram ordered.   See After Visit Summary for Counseling Recommendations

## 2015-02-26 LAB — CYTOLOGY - PAP

## 2015-03-03 ENCOUNTER — Encounter: Payer: Self-pay | Admitting: Physician Assistant

## 2015-03-03 ENCOUNTER — Other Ambulatory Visit: Payer: Self-pay | Admitting: Physician Assistant

## 2015-03-03 DIAGNOSIS — Z1239 Encounter for other screening for malignant neoplasm of breast: Secondary | ICD-10-CM

## 2015-03-08 ENCOUNTER — Ambulatory Visit
Admission: RE | Admit: 2015-03-08 | Discharge: 2015-03-08 | Disposition: A | Discharge status: Home / Self Care | Payer: BLUE CROSS/BLUE SHIELD | Source: Ambulatory Visit | Attending: Physician Assistant | Admitting: Physician Assistant

## 2015-03-08 DIAGNOSIS — R921 Mammographic calcification found on diagnostic imaging of breast: Secondary | ICD-10-CM

## 2015-03-13 LAB — COMPLETE METABOLIC PANEL WITH GFR
ALBUMIN: 4.4 g/dL (ref 3.6–5.1)
ALK PHOS: 42 U/L (ref 33–115)
ALT: 9 U/L (ref 6–29)
AST: 11 U/L (ref 10–30)
BILIRUBIN TOTAL: 1.2 mg/dL (ref 0.2–1.2)
BUN: 9 mg/dL (ref 7–25)
CALCIUM: 9.2 mg/dL (ref 8.6–10.2)
CO2: 27 mmol/L (ref 20–31)
Chloride: 105 mmol/L (ref 98–110)
Creat: 0.71 mg/dL (ref 0.50–1.10)
GFR, Est African American: >89 mL/min (ref >=60)
GLUCOSE: 83 mg/dL (ref 65–99)
Potassium: 4.5 mmol/L (ref 3.5–5.3)
Sodium: 140 mmol/L (ref 135–146)
TOTAL PROTEIN: 6.4 g/dL (ref 6.1–8.1)

## 2015-03-13 LAB — CBC WITH DIFFERENTIAL/PLATELET
BASOS ABS: 0 10*3/uL (ref 0.0–0.1)
Basophils Relative: 0 % (ref 0–1)
EOS ABS: 0.1 10*3/uL (ref 0.0–0.7)
EOS PCT: 1 % (ref 0–5)
HCT: 39 % (ref 36.0–46.0)
Hemoglobin: 12.8 g/dL (ref 12.0–15.0)
LYMPHS ABS: 1.8 10*3/uL (ref 0.7–4.0)
Lymphocytes Relative: 35 % (ref 12–46)
MCH: 30.7 pg (ref 26.0–34.0)
MCHC: 32.8 g/dL (ref 30.0–36.0)
MCV: 93.5 fL (ref 78.0–100.0)
MPV: 10.1 fL (ref 8.6–12.4)
Monocytes Absolute: 0.3 10*3/uL (ref 0.1–1.0)
Monocytes Relative: 5 % (ref 3–12)
Neutro Abs: 3 10*3/uL (ref 1.7–7.7)
Neutrophils Relative %: 59 % (ref 43–77)
PLATELETS: 250 10*3/uL (ref 150–400)
RBC: 4.17 MIL/uL (ref 3.87–5.11)
RDW: 13.5 % (ref 11.5–15.5)
WBC: 5.1 10*3/uL (ref 4.0–10.5)

## 2015-03-13 LAB — FERRITIN: Ferritin: 26 ng/mL (ref 10–291)

## 2015-03-13 LAB — LIPID PANEL
CHOLESTEROL: 176 mg/dL (ref 125–200)
HDL: 65 mg/dL (ref >=46)
LDL Cholesterol: 96 mg/dL (ref <130)
TRIGLYCERIDES: 77 mg/dL (ref <150)
Total CHOL/HDL Ratio: 2.7 Ratio (ref <=5.0)
VLDL: 15 mg/dL (ref <30)

## 2015-03-13 LAB — TSH: TSH: 0.597 u[IU]/mL (ref 0.350–4.500)

## 2015-03-15 ENCOUNTER — Other Ambulatory Visit: Payer: Self-pay | Admitting: Physician Assistant

## 2015-03-15 ENCOUNTER — Encounter: Payer: Self-pay | Admitting: Physician Assistant

## 2015-03-15 DIAGNOSIS — R921 Mammographic calcification found on diagnostic imaging of breast: Secondary | ICD-10-CM

## 2015-04-01 ENCOUNTER — Ambulatory Visit
Admission: RE | Admit: 2015-04-01 | Discharge: 2015-04-01 | Disposition: A | Discharge status: Home / Self Care | Payer: BLUE CROSS/BLUE SHIELD | Source: Ambulatory Visit | Attending: Physician Assistant | Admitting: Physician Assistant

## 2015-04-01 DIAGNOSIS — R921 Mammographic calcification found on diagnostic imaging of breast: Secondary | ICD-10-CM

## 2015-04-05 ENCOUNTER — Encounter: Payer: Self-pay | Admitting: Physician Assistant

## 2015-04-05 DIAGNOSIS — D241 Benign neoplasm of right breast: Secondary | ICD-10-CM | POA: Insufficient documentation

## 2015-04-19 ENCOUNTER — Encounter: Payer: Self-pay | Admitting: Physician Assistant

## 2015-04-19 ENCOUNTER — Other Ambulatory Visit: Payer: Self-pay | Admitting: Physician Assistant

## 2015-04-19 ENCOUNTER — Ambulatory Visit (INDEPENDENT_AMBULATORY_CARE_PROVIDER_SITE_OTHER): Payer: BLUE CROSS/BLUE SHIELD | Admitting: Physician Assistant

## 2015-04-19 VITALS — BP 121/82 | HR 76 | Ht 64.0 in | Wt 147.0 lb

## 2015-04-19 DIAGNOSIS — N898 Other specified noninflammatory disorders of vagina: Secondary | ICD-10-CM | POA: Diagnosis not present

## 2015-04-19 DIAGNOSIS — L298 Other pruritus: Secondary | ICD-10-CM

## 2015-04-19 DIAGNOSIS — N760 Acute vaginitis: Secondary | ICD-10-CM | POA: Diagnosis not present

## 2015-04-19 LAB — WET PREP FOR TRICH, YEAST, CLUE
Clue Cells Wet Prep HPF POC: NONE SEEN
Trich, Wet Prep: NONE SEEN
Yeast Wet Prep HPF POC: NONE SEEN

## 2015-04-19 MED ORDER — FLUCONAZOLE 150 MG PO TABS: 150.0000 mg | ORAL_TABLET | Freq: Once | ORAL | Status: DC

## 2015-04-19 NOTE — Patient Instructions (Signed)
Will call with results

## 2015-04-19 NOTE — Progress Notes (Signed)
   Subjective:    Patient ID: Darlene Grant, female    DOB: 1976-05-13, 39 y.o.   MRN: [id number]  HPI  Patient is a 39 year old female who presents to the clinic with vaginal discharge and extreme vaginal itching for 4 days. She felt like these were symptoms of yeast infection so she started Monistat over-the-counter. She has had minimal improvement. She denies any vaginal odor. She is sexually active with her husband only. She does not suspect any infidelity. She denies any abdominal pain, fever, chills.   Review of Systems  All other systems reviewed and are negative.      Objective:   Physical Exam  Constitutional: She is oriented to person, place, and time. She appears well-developed and well-nourished.  HENT:  Head: Normocephalic and atraumatic.  Cardiovascular: Normal rate, regular rhythm and normal heart sounds.   Pulmonary/Chest: Effort normal and breath sounds normal.  Abdominal: Soft. Bowel sounds are normal. She exhibits no distension and no mass. There is no tenderness. There is no rebound and no guarding.  Negative for CVA tenderness bilaterally.   Genitourinary: Vaginal discharge found.  erythematous frail vaginal canal with white thick discharge without odor.   Neurological: She is alert and oriented to person, place, and time.  Psychiatric: She has a normal mood and affect. Her behavior is normal.          Assessment & Plan:  Vaginitis-I was negative for yeast, BV, Trichomonas. Clinical exam appeared like yeast infection. Treated with Diflucan 1 tablet now and in 48 hours if symptoms persist. Discuss with patient if discharge is still present we can consider STD testing. Follow-up as needed.

## 2015-05-17 ENCOUNTER — Encounter: Payer: Self-pay | Admitting: Physician Assistant

## 2015-05-17 ENCOUNTER — Ambulatory Visit (INDEPENDENT_AMBULATORY_CARE_PROVIDER_SITE_OTHER): Payer: BLUE CROSS/BLUE SHIELD | Admitting: Physician Assistant

## 2015-05-17 ENCOUNTER — Other Ambulatory Visit: Payer: Self-pay | Admitting: Physician Assistant

## 2015-05-17 VITALS — BP 106/64 | HR 74 | Ht 64.0 in | Wt 146.0 lb

## 2015-05-17 DIAGNOSIS — R82998 Other abnormal findings in urine: Secondary | ICD-10-CM

## 2015-05-17 DIAGNOSIS — L298 Other pruritus: Secondary | ICD-10-CM | POA: Diagnosis not present

## 2015-05-17 DIAGNOSIS — N390 Urinary tract infection, site not specified: Secondary | ICD-10-CM | POA: Diagnosis not present

## 2015-05-17 DIAGNOSIS — Z111 Encounter for screening for respiratory tuberculosis: Secondary | ICD-10-CM | POA: Diagnosis not present

## 2015-05-17 DIAGNOSIS — N898 Other specified noninflammatory disorders of vagina: Secondary | ICD-10-CM | POA: Diagnosis not present

## 2015-05-17 LAB — POCT URINALYSIS DIPSTICK
BILIRUBIN UA: NEGATIVE
Blood, UA: NEGATIVE
GLUCOSE UA: NEGATIVE
KETONES UA: NEGATIVE
NITRITE UA: NEGATIVE
Protein, UA: NEGATIVE
SPEC GRAV UA: 1.01
Urobilinogen, UA: 0.2
pH, UA: 7

## 2015-05-17 LAB — WET PREP FOR TRICH, YEAST, CLUE
Clue Cells Wet Prep HPF POC: NONE SEEN
TRICH WET PREP: NONE SEEN
WBC, Wet Prep HPF POC: NONE SEEN
Yeast Wet Prep HPF POC: NONE SEEN

## 2015-05-17 MED ORDER — HYDROCORTISONE VALERATE 0.2 % EX OINT: 1.0000 "application " | TOPICAL_OINTMENT | Freq: Two times a day (BID) | CUTANEOUS | Status: DC

## 2015-05-17 NOTE — Progress Notes (Signed)
   Subjective:    Patient ID: Darlene Grant, female    DOB: 1975/11/23, 39 y.o.   MRN: [id number]  HPI   Patient is a 39 year old female who presents to the clinic with vaginal itching and discharge. She had the same symptoms about the same time last month. Her wet prep was negative. She did go ahead and take the Diflucan and symptoms resolved and about 3-4 days. This has started to happen right around ovulation for the last 2 months. She denies the possibility of STDs and does not want testing. She describes the discharge as thick and white. She is wearing cotton panties and not use any scented products. She got a new lubrication with cooking all which has helped sex not be painful. She has not treated these current symptoms with any Monistat. She denies any abdominal pain.    Review of Systems See history of present illness    Objective:   Physical Exam  Constitutional: She appears well-developed and well-nourished.  Abdominal: Soft. Bowel sounds are normal. She exhibits no distension and no mass. There is no tenderness. There is no rebound and no guarding.  Genitourinary:             Assessment & Plan:  Vaginal itching/vaginal discharge- stat wet prep. Last wet prep done was negative for yeast. Will hold on treatment until we get wet prep back. If there are no findings need to consider that this is more of an inflammation of the vaginal area due to something. Patient needs to be looking for anything agent that she could become in contact with causing this irritation. We could also consider doing a Gen-Probe more specific wet prep to look for any yeast or bacterial vaginosis.  .. Results for orders placed or performed in visit on 05/17/15  WET PREP FOR [place], YEAST, CLUE  Result Value Ref Range   Yeast Wet Prep HPF POC NONE SEEN NONE SEEN   Trich, Wet Prep NONE SEEN NONE SEEN   Clue Cells Wet Prep HPF POC NONE SEEN NONE SEEN   WBC, Wet Prep HPF POC NONE SEEN NONE SEEN   POCT urinalysis dipstick  Result Value Ref Range   Color, UA light yellow    Clarity, UA slightly cloudy    Glucose, UA neg    Bilirubin, UA neg    Ketones, UA neg    Spec Grav, UA 1.010    Blood, UA neg    pH, UA 7.0    Protein, UA neg    Urobilinogen, UA 0.2    Nitrite, UA neg    Leukocytes, UA moderate (2+) (A) Negative   Will culture.   Need PPD and placed today. Return in 48-72 hours to have read.

## 2015-05-18 ENCOUNTER — Encounter: Payer: Self-pay | Admitting: Physician Assistant

## 2015-05-19 ENCOUNTER — Encounter: Payer: Self-pay | Admitting: Emergency Medicine

## 2015-05-19 ENCOUNTER — Ambulatory Visit: Payer: Self-pay | Admitting: Physician Assistant

## 2015-05-19 ENCOUNTER — Ambulatory Visit (INDEPENDENT_AMBULATORY_CARE_PROVIDER_SITE_OTHER): Payer: BLUE CROSS/BLUE SHIELD | Admitting: Physician Assistant

## 2015-05-19 ENCOUNTER — Other Ambulatory Visit: Payer: Self-pay | Admitting: Physician Assistant

## 2015-05-19 DIAGNOSIS — Z7689 Persons encountering health services in other specified circumstances: Secondary | ICD-10-CM

## 2015-05-19 DIAGNOSIS — Z111 Encounter for screening for respiratory tuberculosis: Secondary | ICD-10-CM

## 2015-05-19 DIAGNOSIS — B3741 Candidal cystitis and urethritis: Secondary | ICD-10-CM | POA: Insufficient documentation

## 2015-05-19 LAB — TB SKIN TEST
INDURATION: NEGATIVE mm
TB Skin Test: NEGATIVE

## 2015-05-19 LAB — URINE CULTURE

## 2015-05-19 MED ORDER — FLUCONAZOLE 150 MG PO TABS
ORAL_TABLET | ORAL | Status: DC
Start: 2015-05-19 — End: 2015-11-22

## 2015-05-19 NOTE — Progress Notes (Signed)
   Subjective:    Patient ID: Darlene Grant, female    DOB: 05-Nov-1975, 39 y.o.   MRN: [id number]  HPI  Pt comes in for PPD to be read.   Review of Systems     Objective:   Physical Exam        Assessment & Plan:  PPD read and negative. No induration was noted.

## 2015-06-20 ENCOUNTER — Encounter: Payer: Self-pay | Admitting: Physician Assistant

## 2015-11-22 ENCOUNTER — Encounter: Payer: Self-pay | Admitting: Advanced Practice Midwife

## 2015-11-22 ENCOUNTER — Ambulatory Visit (INDEPENDENT_AMBULATORY_CARE_PROVIDER_SITE_OTHER): Payer: BLUE CROSS/BLUE SHIELD | Admitting: Advanced Practice Midwife

## 2015-11-22 VITALS — BP 115/72 | HR 77 | Wt 147.0 lb

## 2015-11-22 DIAGNOSIS — Z113 Encounter for screening for infections with a predominantly sexual mode of transmission: Secondary | ICD-10-CM

## 2015-11-22 DIAGNOSIS — O09521 Supervision of elderly multigravida, first trimester: Secondary | ICD-10-CM

## 2015-11-22 DIAGNOSIS — Z3481 Encounter for supervision of other normal pregnancy, first trimester: Secondary | ICD-10-CM

## 2015-11-22 DIAGNOSIS — O34219 Maternal care for unspecified type scar from previous cesarean delivery: Secondary | ICD-10-CM

## 2015-11-22 DIAGNOSIS — Z36 Encounter for antenatal screening of mother: Secondary | ICD-10-CM | POA: Diagnosis not present

## 2015-11-22 DIAGNOSIS — Z3491 Encounter for supervision of normal pregnancy, unspecified, first trimester: Secondary | ICD-10-CM

## 2015-11-22 DIAGNOSIS — Z3490 Encounter for supervision of normal pregnancy, unspecified, unspecified trimester: Secondary | ICD-10-CM | POA: Insufficient documentation

## 2015-11-22 DIAGNOSIS — O09529 Supervision of elderly multigravida, unspecified trimester: Secondary | ICD-10-CM | POA: Insufficient documentation

## 2015-11-22 NOTE — Patient Instructions (Signed)
First Trimester of Pregnancy The first trimester of pregnancy is from week 1 until the end of week 12 (months 1 through 3). A week after a sperm fertilizes an egg, the egg will implant on the wall of the uterus. This embryo will begin to develop into a baby. Genes from you and your partner are forming the baby. The female genes determine whether the baby is a boy or a girl. At 6-8 weeks, the eyes and face are formed, and the heartbeat can be seen on ultrasound. At the end of 12 weeks, all the baby's organs are formed.  Now that you are pregnant, you will want to do everything you can to have a healthy baby. Two of the most important things are to get good prenatal care and to follow your health care provider's instructions. Prenatal care is all the medical care you receive before the baby's birth. This care will help prevent, find, and treat any problems during the pregnancy and childbirth. BODY CHANGES Your body goes through many changes during pregnancy. The changes vary from woman to woman.   You may gain or lose a couple of pounds at first.  You may feel sick to your stomach (nauseous) and throw up (vomit). If the vomiting is uncontrollable, call your health care provider.  You may tire easily.  You may develop headaches that can be relieved by medicines approved by your health care provider.  You may urinate more often. Painful urination may mean you have a bladder infection.  You may develop heartburn as a result of your pregnancy.  You may develop constipation because certain hormones are causing the muscles that push waste through your intestines to slow down.  You may develop hemorrhoids or swollen, bulging veins (varicose veins).  Your breasts may begin to grow larger and become tender. Your nipples may stick out more, and the tissue that surrounds them (areola) may become darker.  Your gums may bleed and may be sensitive to brushing and flossing.  Dark spots or blotches (chloasma,  mask of pregnancy) may develop on your face. This will likely fade after the baby is born.  Your menstrual periods will stop.  You may have a loss of appetite.  You may develop cravings for certain kinds of food.  You may have changes in your emotions from day to day, such as being excited to be pregnant or being concerned that something may go wrong with the pregnancy and baby.  You may have more vivid and strange dreams.  You may have changes in your hair. These can include thickening of your hair, rapid growth, and changes in texture. Some women also have hair loss during or after pregnancy, or hair that feels dry or thin. Your hair will most likely return to normal after your baby is born. WHAT TO EXPECT AT YOUR PRENATAL VISITS During a routine prenatal visit:  You will be weighed to make sure you and the baby are growing normally.  Your blood pressure will be taken.  Your abdomen will be measured to track your baby's growth.  The fetal heartbeat will be listened to starting around week 10 or 12 of your pregnancy.  Test results from any previous visits will be discussed. Your health care provider may ask you:  How you are feeling.  If you are feeling the baby move.  If you have had any abnormal symptoms, such as leaking fluid, bleeding, severe headaches, or abdominal cramping.  If you are using any tobacco products,   including cigarettes, chewing tobacco, and electronic cigarettes.  If you have any questions. Other tests that may be performed during your first trimester include:  Blood tests to find your blood type and to check for the presence of any previous infections. They will also be used to check for low iron levels (anemia) and Rh antibodies. Later in the pregnancy, blood tests for diabetes will be done along with other tests if problems develop.  Urine tests to check for infections, diabetes, or protein in the urine.  An ultrasound to confirm the proper growth  and development of the baby.  An amniocentesis to check for possible genetic problems.  Fetal screens for spina bifida and Down syndrome.  You may need other tests to make sure you and the baby are doing well.  HIV (human immunodeficiency virus) testing. Routine prenatal testing includes screening for HIV, unless you choose not to have this test. HOME CARE INSTRUCTIONS  Medicines  Follow your health care provider's instructions regarding medicine use. Specific medicines may be either safe or unsafe to take during pregnancy.  Take your prenatal vitamins as directed.  If you develop constipation, try taking a stool softener if your health care provider approves. Diet  Eat regular, well-balanced meals. Choose a variety of foods, such as meat or vegetable-based protein, fish, milk and low-fat dairy products, vegetables, fruits, and whole grain breads and cereals. Your health care provider will help you determine the amount of weight gain that is right for you.  Avoid raw meat and uncooked cheese. These carry germs that can cause birth defects in the baby.  Eating four or five small meals rather than three large meals a day may help relieve nausea and vomiting. If you start to feel nauseous, eating a few soda crackers can be helpful. Drinking liquids between meals instead of during meals also seems to help nausea and vomiting.  If you develop constipation, eat more high-fiber foods, such as fresh vegetables or fruit and whole grains. Drink enough fluids to keep your urine clear or pale yellow. Activity and Exercise  Exercise only as directed by your health care provider. Exercising will help you:  Control your weight.  Stay in shape.  Be prepared for labor and delivery.  Experiencing pain or cramping in the lower abdomen or low back is a good sign that you should stop exercising. Check with your health care provider before continuing normal exercises.  Try to avoid standing for long  periods of time. Move your legs often if you must stand in one place for a long time.  Avoid heavy lifting.  Wear low-heeled shoes, and practice good posture.  You may continue to have sex unless your health care provider directs you otherwise. Relief of Pain or Discomfort  Wear a good support bra for breast tenderness.   Take warm sitz baths to soothe any pain or discomfort caused by hemorrhoids. Use hemorrhoid cream if your health care provider approves.   Rest with your legs elevated if you have leg cramps or low back pain.  If you develop varicose veins in your legs, wear support hose. Elevate your feet for 15 minutes, 3-4 times a day. Limit salt in your diet. Prenatal Care  Schedule your prenatal visits by the twelfth week of pregnancy. They are usually scheduled monthly at first, then more often in the last 2 months before delivery.  Write down your questions. Take them to your prenatal visits.  Keep all your prenatal visits as directed by your   health care provider. Safety  Wear your seat belt at all times when driving.  Make a list of emergency phone numbers, including numbers for family, friends, the hospital, and police and fire departments. General Tips  Ask your health care provider for a referral to a local prenatal education class. Begin classes no later than at the beginning of month 6 of your pregnancy.  Ask for help if you have counseling or nutritional needs during pregnancy. Your health care provider can offer advice or refer you to specialists for help with various needs.  Do not use hot tubs, steam rooms, or saunas.  Do not douche or use tampons or scented sanitary pads.  Do not cross your legs for long periods of time.  Avoid cat litter boxes and soil used by cats. These carry germs that can cause birth defects in the baby and possibly loss of the fetus by miscarriage or stillbirth.  Avoid all smoking, herbs, alcohol, and medicines not prescribed by  your health care provider. Chemicals in these affect the formation and growth of the baby.  Do not use any tobacco products, including cigarettes, chewing tobacco, and electronic cigarettes. If you need help quitting, ask your health care provider. You may receive counseling support and other resources to help you quit.  Schedule a dentist appointment. At home, brush your teeth with a soft toothbrush and be gentle when you floss. SEEK MEDICAL CARE IF:   You have dizziness.  You have mild pelvic cramps, pelvic pressure, or nagging pain in the abdominal area.  You have persistent nausea, vomiting, or diarrhea.  You have a bad smelling vaginal discharge.  You have pain with urination.  You notice increased swelling in your face, hands, legs, or ankles. SEEK IMMEDIATE MEDICAL CARE IF:   You have a fever.  You are leaking fluid from your vagina.  You have spotting or bleeding from your vagina.  You have severe abdominal cramping or pain.  You have rapid weight gain or loss.  You vomit blood or material that looks like coffee grounds.  You are exposed to German measles and have never had them.  You are exposed to fifth disease or chickenpox.  You develop a severe headache.  You have shortness of breath.  You have any kind of trauma, such as from a fall or a car accident.   This information is not intended to replace advice given to you by your health care provider. Make sure you discuss any questions you have with your health care provider.   Document Released: 05/30/2001 Document Revised: 06/26/2014 Document Reviewed: 04/15/2013 Elsevier Interactive Patient Education 2016 [employer]  

## 2015-11-22 NOTE — Progress Notes (Signed)
   Subjective:    Darlene Grant is a G2P1001 at 8.5 by LMP verifies by [place] today being seen today for her first obstetrical visit.  Her obstetrical history is significant for advanced maternal age, macrosomia and cesarean. Patient does intend to breast feed. Had difficulty BF w/ first baby. May want antenatal Lactation consultation. Pregnancy history fully reviewed.  Patient reports no complaints.  Filed Vitals:   11/22/15 1100  BP: 115/72  Pulse: 77  Weight: 147 lb (66.679 kg)    HISTORY: OB History  Gravida Para Term Preterm AB SAB TAB Ectopic Multiple Living  2 1 1       1     # Outcome Date GA Lbr Len/2nd Weight Sex Delivery Anes PTL Lv  2 Current           1 Term 11/21/13 [redacted]w[redacted]d  9 lb 4.8 oz (4.218 kg) M CS-LTranv EPI  Y     Past Medical History  Diagnosis Date  . Breast pain, left   . Rectal itching   . Candida vaginitis   . Abnormal Pap smear     ASCUS 03/13/11  . Anemia    Past Surgical History  Procedure Laterality Date  . Pilonidal cyst excision  2000  . Wisdom tooth extraction  AGE 40  . Hand surgery  04/2012    ligament repair  . Cesarean section N/A 11/21/2013    Procedure: CESAREAN SECTION;  Surgeon: Alwyn Pea, MD;  Location: [facility];  Service: Obstetrics;  Laterality: N/A;  . Breast biopsy Right 8/16   Family History  Problem Relation Age of Onset  . Cancer Mother 33    breast, premenopausal  . Cancer Maternal Grandmother     skin, stomach  . Cancer Maternal Grandfather     lung     Exam    BP 115/72 mmHg  Pulse 77  Wt 147 lb (66.679 kg)  LMP 09/22/2014  Informal BS US show 8.2 week live SIUP  Uterine Size:   Pelvic Exam: deferred  System: Breast:  Declined   Skin: normal coloration and turgor, no rashes    Neurologic: negative   Extremities: normal strength, tone, and muscle mass   HEENT neck supple with midline trachea and thyroid without masses   Mouth/Teeth mucous membranes moist, pharynx normal without lesions   Neck supple and no masses   Cardiovascular: regular rate and rhythm, no murmurs or gallops   Respiratory:  appears well, vitals normal, no respiratory distress, acyanotic, normal RR, neck free of mass or lymphadenopathy, chest clear, no wheezing, crepitations, rhonchi, normal symmetric air entry   Abdomen: soft, non-tender; bowel sounds normal; no masses,  no organomegaly     Assessment:    Pregnancy: G2P1001 Patient Active Problem List   Diagnosis Date Noted  . Yeast cystitis 05/19/2015  . 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 Calcification of right breast 02/19/2015  . Cesarean delivery delivered 11/21/2013  . 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 PROM (premature rupture of membranes) 11/19/2013  . Anemia 07/05/2012  . Ligament tear 07/05/2012        Plan:     Initial labs drawn. Prenatal vitamins. Problem list reviewed and updated. Genetic Screening discussed [place]: requested.  Ultrasound discussed; fetal survey: requested. Considering repeat C/S. VBAC info given.   Follow up in 4 weeks.    Manya Silvas 11/22/2015

## 2015-11-22 NOTE — Progress Notes (Signed)
Bedside U/S shows IUP with FHT of 171 BPM and CRL is 18.27mm  GA is [redacted]w[redacted]d

## 2015-11-23 LAB — OBSTETRIC PANEL
ANTIBODY SCREEN: NEGATIVE
Basophils Absolute: 0 cells/uL (ref 0–200)
Basophils Relative: 0 %
Eosinophils Absolute: 0 cells/uL — ABNORMAL LOW (ref 15–500)
Eosinophils Relative: 0 %
HEMATOCRIT: 34.9 % — AB (ref 35.0–45.0)
HEP B S AG: NEGATIVE
Hemoglobin: 11.7 g/dL (ref 11.7–15.5)
Lymphocytes Relative: 23 %
Lymphs Abs: 1449 cells/uL (ref 850–3900)
MCH: 31.3 pg (ref 27.0–33.0)
MCHC: 33.5 g/dL (ref 32.0–36.0)
MCV: 93.3 fL (ref 80.0–100.0)
MPV: 9.9 fL (ref 7.5–12.5)
Monocytes Absolute: 378 cells/uL (ref 200–950)
Monocytes Relative: 6 %
NEUTROS ABS: 4473 {cells}/uL (ref 1500–7800)
NEUTROS PCT: 71 %
Platelets: 235 10*3/uL (ref 140–400)
RBC: 3.74 MIL/uL — ABNORMAL LOW (ref 3.80–5.10)
RDW: 13.2 % (ref 11.0–15.0)
RH TYPE: POSITIVE
Rubella: 2.22 Index — ABNORMAL HIGH (ref <0.90)
WBC: 6.3 10*3/uL (ref 3.8–10.8)

## 2015-11-23 LAB — HIV ANTIBODY (ROUTINE TESTING W REFLEX): HIV 1&2 Ab, 4th Generation: NONREACTIVE

## 2015-11-24 LAB — URINE CYTOLOGY ANCILLARY ONLY
Chlamydia: NEGATIVE
Neisseria Gonorrhea: NEGATIVE

## 2015-11-24 LAB — CULTURE, URINE COMPREHENSIVE
COLONY COUNT: NO GROWTH
Organism ID, Bacteria: NO GROWTH

## 2015-12-20 ENCOUNTER — Ambulatory Visit (INDEPENDENT_AMBULATORY_CARE_PROVIDER_SITE_OTHER): Payer: BLUE CROSS/BLUE SHIELD | Admitting: Obstetrics & Gynecology

## 2015-12-20 VITALS — BP 109/75 | HR 77 | Wt 156.0 lb

## 2015-12-20 DIAGNOSIS — Z36 Encounter for antenatal screening of mother: Secondary | ICD-10-CM

## 2015-12-20 DIAGNOSIS — Z3481 Encounter for supervision of other normal pregnancy, first trimester: Secondary | ICD-10-CM

## 2015-12-20 DIAGNOSIS — O09523 Supervision of elderly multigravida, third trimester: Secondary | ICD-10-CM

## 2015-12-20 NOTE — Progress Notes (Signed)
Subjective:  Darlene Grant is a 40 y.o. G2P1001 at [redacted]w[redacted]d being seen today for ongoing prenatal care.  She is currently monitored for the following issues for this low-risk pregnancy and has Anemia; Ligament tear; Pregnancy with history of cesarean section, antepartum; Calcification of right breast; Fibroadenoma of right breast; Supervision of normal pregnancy, antepartum; and AMA (advanced maternal age) multigravida 35+ on her problem list.  Patient reports no complaints.  Contractions: Not present. Vag. Bleeding: None.  Denies leaking of fluid.   The following portions of the patient's history were reviewed and updated as appropriate: allergies, current medications, past family history, past medical history, past social history, past surgical history and problem list. Problem list updated.  Objective:   Filed Vitals:   12/20/15 1521  BP: 109/75  Pulse: 77  Weight: 156 lb (70.761 kg)    Fetal Status: Fetal Heart Rate (bpm): 151         General:  Alert, oriented and cooperative. Patient is in no acute distress.  Skin: Skin is warm and dry. No rash noted.   Cardiovascular: Normal heart rate noted  Respiratory: Normal respiratory effort, no problems with respiration noted  Abdomen: Soft, gravid, appropriate for gestational age. Pain/Pressure: Absent     Pelvic: Cervical exam deferred        Extremities: Normal range of motion.     Mental Status: Normal mood and affect. Normal behavior. Normal judgment and thought content.   Urinalysis: Urine Protein: Negative Urine Glucose: Negative  Assessment and Plan:  Pregnancy: G2P1001 at [redacted]w[redacted]d  1. AMA (advanced maternal age) multigravida 4+, third trimester - Genetic Screening/[place] done today. Anatomy scan ordered also. - [place] MFM OB COMP + 14 WK; Future  2. Supervision of normal pregnancy, antepartum, first trimester No other complaints or concerns.  Routine obstetric precautions reviewed. Please refer to After Visit Summary  for other counseling recommendations.  Return in about 4 weeks (around 01/17/2016) for OB Visit.   Osborne Oman, MD

## 2015-12-20 NOTE — Patient Instructions (Signed)

## 2015-12-27 ENCOUNTER — Encounter: Payer: Self-pay | Admitting: *Deleted

## 2015-12-27 DIAGNOSIS — Z3481 Encounter for supervision of other normal pregnancy, first trimester: Secondary | ICD-10-CM

## 2016-01-17 ENCOUNTER — Ambulatory Visit (INDEPENDENT_AMBULATORY_CARE_PROVIDER_SITE_OTHER): Payer: BLUE CROSS/BLUE SHIELD | Admitting: Obstetrics & Gynecology

## 2016-01-17 VITALS — BP 111/67 | HR 87 | Wt 161.0 lb

## 2016-01-17 DIAGNOSIS — O09523 Supervision of elderly multigravida, third trimester: Secondary | ICD-10-CM

## 2016-01-17 DIAGNOSIS — Z36 Encounter for antenatal screening of mother: Secondary | ICD-10-CM

## 2016-01-17 DIAGNOSIS — N898 Other specified noninflammatory disorders of vagina: Secondary | ICD-10-CM

## 2016-01-17 DIAGNOSIS — Z3483 Encounter for supervision of other normal pregnancy, third trimester: Secondary | ICD-10-CM

## 2016-01-17 DIAGNOSIS — O34219 Maternal care for unspecified type scar from previous cesarean delivery: Secondary | ICD-10-CM

## 2016-01-17 DIAGNOSIS — N9489 Other specified conditions associated with female genital organs and menstrual cycle: Secondary | ICD-10-CM

## 2016-01-17 NOTE — Addendum Note (Signed)
Addended by: Lyndal Rainbow on: 01/17/2016 04:05 PM   Modules accepted: Orders

## 2016-01-17 NOTE — Progress Notes (Signed)
Subjective:  Darlene Grant is a 40 y.o. G2P1001 at [redacted]w[redacted]d being seen today for ongoing prenatal care.  She is currently monitored for the following issues for this high-risk pregnancy and has Anemia; Ligament tear; Pregnancy with history of cesarean section, antepartum; Calcification of right breast; Fibroadenoma of right breast; Supervision of normal pregnancy, antepartum; and AMA (advanced maternal age) multigravida 35+ on her problem list.  Patient reports no complaints.  Contractions: Not present. Vag. Bleeding: None.   . Denies leaking of fluid.   The following portions of the patient's history were reviewed and updated as appropriate: allergies, current medications, past family history, past medical history, past social history, past surgical history and problem list. Problem list updated.  Objective:   Vitals:   01/17/16 1532  BP: 111/67  Pulse: 87  Weight: 161 lb (73 kg)    Fetal Status: Fetal Heart Rate (bpm): 147         General:  Alert, oriented and cooperative. Patient is in no acute distress.  Skin: Skin is warm and dry. No rash noted.   Cardiovascular: Normal heart rate noted  Respiratory: Normal respiratory effort, no problems with respiration noted  Abdomen: Soft, gravid, appropriate for gestational age. Pain/Pressure: Absent     Pelvic:  Cervical exam deferred        Extremities: Normal range of motion.  Edema: Trace  Mental Status: Normal mood and affect. Normal behavior. Normal judgment and thought content.   Urinalysis: Urine Protein: Negative Urine Glucose: Negative  Assessment and Plan:  Pregnancy: G2P1001 at [redacted]w[redacted]d  1. AMA (advanced maternal age) multigravida 36+, third trimester - Normal harmony  2. Pregnancy with history of cesarean section, antepartum   3. Supervision of normal pregnancy, antepartum, third trimester - AFP today - Anatomy u/s 01-31-16  Preterm labor symptoms and general obstetric precautions including but not limited to  vaginal bleeding, contractions, leaking of fluid and fetal movement were reviewed in detail with the patient. Please refer to After Visit Summary for other counseling recommendations.  No Follow-up on file.   Emily Filbert, MD

## 2016-01-17 NOTE — Progress Notes (Signed)
Pt is experiencing vaginal odor. She said there are no other symptoms along with the odor. She requested we send out a urine culture .

## 2016-01-19 LAB — URINE CULTURE: Organism ID, Bacteria: 10000

## 2016-01-24 ENCOUNTER — Encounter: Payer: Self-pay | Admitting: Advanced Practice Midwife

## 2016-01-24 LAB — ALPHA FETOPROTEIN, MATERNAL
AFP: 18.8 ng/mL
CURR GEST AGE: 16.7 wk
MoM for AFP: 0.55
Open Spina bifida: NEGATIVE
Osb Risk: <1:27300 {titer}

## 2016-01-26 ENCOUNTER — Encounter (HOSPITAL_COMMUNITY): Payer: Self-pay | Admitting: Obstetrics & Gynecology

## 2016-01-31 ENCOUNTER — Ambulatory Visit (HOSPITAL_COMMUNITY)
Admission: RE | Admit: 2016-01-31 | Discharge: 2016-01-31 | Disposition: A | Discharge status: Home / Self Care | Payer: BLUE CROSS/BLUE SHIELD | Source: Ambulatory Visit | Attending: Obstetrics & Gynecology | Admitting: Obstetrics & Gynecology

## 2016-01-31 ENCOUNTER — Other Ambulatory Visit: Payer: Self-pay | Admitting: Obstetrics & Gynecology

## 2016-01-31 DIAGNOSIS — Z3689 Encounter for other specified antenatal screening: Secondary | ICD-10-CM

## 2016-01-31 DIAGNOSIS — O34219 Maternal care for unspecified type scar from previous cesarean delivery: Secondary | ICD-10-CM | POA: Diagnosis not present

## 2016-01-31 DIAGNOSIS — Z36 Encounter for antenatal screening of mother: Secondary | ICD-10-CM | POA: Diagnosis not present

## 2016-01-31 DIAGNOSIS — O09523 Supervision of elderly multigravida, third trimester: Secondary | ICD-10-CM | POA: Insufficient documentation

## 2016-01-31 DIAGNOSIS — O09292 Supervision of pregnancy with other poor reproductive or obstetric history, second trimester: Secondary | ICD-10-CM | POA: Insufficient documentation

## 2016-01-31 DIAGNOSIS — Z3A18 18 weeks gestation of pregnancy: Secondary | ICD-10-CM | POA: Insufficient documentation

## 2016-01-31 DIAGNOSIS — Z3481 Encounter for supervision of other normal pregnancy, first trimester: Secondary | ICD-10-CM

## 2016-02-01 ENCOUNTER — Encounter: Payer: Self-pay | Admitting: Obstetrics & Gynecology

## 2016-02-01 ENCOUNTER — Telehealth: Payer: Self-pay | Admitting: *Deleted

## 2016-02-01 ENCOUNTER — Encounter: Payer: Self-pay | Admitting: Advanced Practice Midwife

## 2016-02-01 DIAGNOSIS — O4440 Low lying placenta NOS or without hemorrhage, unspecified trimester: Secondary | ICD-10-CM

## 2016-02-01 NOTE — Telephone Encounter (Signed)
Pt notified of U/S results showing a low-lying placenta.  Pt stated that this happened with her last pregnancy.  Per Dr Harolyn Rutherford she is to have a repeat U/S in 6 weeks but patient prefers 4 weeks.  Precautions  Given to avoid intercourse and no lifting > 5 labs until the placenta has been resolved.

## 2016-02-01 NOTE — Telephone Encounter (Signed)
-----   Message from Osborne Oman, MD sent at 02/01/2016  8:38 AM EDT ----- Low lying placenta noted on ultrasound. Please call patient and give pelvic rest precautions. Needs repeat scan around 24 weeks.

## 2016-02-14 ENCOUNTER — Ambulatory Visit (INDEPENDENT_AMBULATORY_CARE_PROVIDER_SITE_OTHER): Payer: BLUE CROSS/BLUE SHIELD | Admitting: Advanced Practice Midwife

## 2016-02-14 ENCOUNTER — Encounter: Payer: Self-pay | Admitting: Advanced Practice Midwife

## 2016-02-14 VITALS — BP 106/71 | HR 79 | Wt 164.0 lb

## 2016-02-14 DIAGNOSIS — O4440 Low lying placenta NOS or without hemorrhage, unspecified trimester: Secondary | ICD-10-CM

## 2016-02-14 DIAGNOSIS — O09292 Supervision of pregnancy with other poor reproductive or obstetric history, second trimester: Secondary | ICD-10-CM

## 2016-02-14 DIAGNOSIS — G9782 Other postprocedural complications and disorders of nervous system: Secondary | ICD-10-CM

## 2016-02-14 DIAGNOSIS — O34219 Maternal care for unspecified type scar from previous cesarean delivery: Secondary | ICD-10-CM

## 2016-02-14 DIAGNOSIS — O09523 Supervision of elderly multigravida, third trimester: Secondary | ICD-10-CM

## 2016-02-14 DIAGNOSIS — Z3483 Encounter for supervision of other normal pregnancy, third trimester: Secondary | ICD-10-CM

## 2016-02-14 NOTE — Patient Instructions (Addendum)
28 Starburst jelly beans  Duramorph    Second Trimester of Pregnancy The second trimester is from week 13 through week 28, months 4 through 6. The second trimester is often a time when you feel your best. Your body has also adjusted to being pregnant, and you begin to feel better physically. Usually, morning sickness has lessened or quit completely, you may have more energy, and you may have an increase in appetite. The second trimester is also a time when the fetus is growing rapidly. At the end of the sixth month, the fetus is about 9 inches long and weighs about 1 pounds. You will likely begin to feel the baby move (quickening) between 18 and 20 weeks of the pregnancy. BODY CHANGES Your body goes through many changes during pregnancy. The changes vary from woman to woman.   Your weight will continue to increase. You will notice your lower abdomen bulging out.  You may begin to get stretch marks on your hips, abdomen, and breasts.  You may develop headaches that can be relieved by medicines approved by your health care provider.  You may urinate more often because the fetus is pressing on your bladder.  You may develop or continue to have heartburn as a result of your pregnancy.  You may develop constipation because certain hormones are causing the muscles that push waste through your intestines to slow down.  You may develop hemorrhoids or swollen, bulging veins (varicose veins).  You may have back pain because of the weight gain and pregnancy hormones relaxing your joints between the bones in your pelvis and as a result of a shift in weight and the muscles that support your balance.  Your breasts will continue to grow and be tender.  Your gums may bleed and may be sensitive to brushing and flossing.  Dark spots or blotches (chloasma, mask of pregnancy) may develop on your face. This will likely fade after the baby is born.  A dark line from your belly button to the pubic area  (linea nigra) may appear. This will likely fade after the baby is born.  You may have changes in your hair. These can include thickening of your hair, rapid growth, and changes in texture. Some women also have hair loss during or after pregnancy, or hair that feels dry or thin. Your hair will most likely return to normal after your baby is born. WHAT TO EXPECT AT YOUR PRENATAL VISITS During a routine prenatal visit:  You will be weighed to make sure you and the fetus are growing normally.  Your blood pressure will be taken.  Your abdomen will be measured to track your baby's growth.  The fetal heartbeat will be listened to.  Any test results from the previous visit will be discussed. Your health care provider may ask you:  How you are feeling.  If you are feeling the baby move.  If you have had any abnormal symptoms, such as leaking fluid, bleeding, severe headaches, or abdominal cramping.  If you are using any tobacco products, including cigarettes, chewing tobacco, and electronic cigarettes.  If you have any questions. Other tests that may be performed during your second trimester include:  Blood tests that check for:  Low iron levels (anemia).  Gestational diabetes (between 24 and 28 weeks).  Rh antibodies.  Urine tests to check for infections, diabetes, or protein in the urine.  An ultrasound to confirm the proper growth and development of the baby.  An amniocentesis to check for  possible genetic problems.  Fetal screens for spina bifida and Down syndrome.  HIV (human immunodeficiency virus) testing. Routine prenatal testing includes screening for HIV, unless you choose not to have this test. HOME CARE INSTRUCTIONS   Avoid all smoking, herbs, alcohol, and unprescribed drugs. These chemicals affect the formation and growth of the baby.  Do not use any tobacco products, including cigarettes, chewing tobacco, and electronic cigarettes. If you need help quitting, ask  your health care provider. You may receive counseling support and other resources to help you quit.  Follow your health care provider's instructions regarding medicine use. There are medicines that are either safe or unsafe to take during pregnancy.  Exercise only as directed by your health care provider. Experiencing uterine cramps is a good sign to stop exercising.  Continue to eat regular, healthy meals.  Wear a good support bra for breast tenderness.  Do not use hot tubs, steam rooms, or saunas.  Wear your seat belt at all times when driving.  Avoid raw meat, uncooked cheese, cat litter boxes, and soil used by cats. These carry germs that can cause birth defects in the baby.  Take your prenatal vitamins.  Take 1500-2000 mg of calcium daily starting at the 20th week of pregnancy until you deliver your baby.  Try taking a stool softener (if your health care provider approves) if you develop constipation. Eat more high-fiber foods, such as fresh vegetables or fruit and whole grains. Drink plenty of fluids to keep your urine clear or pale yellow.  Take warm sitz baths to soothe any pain or discomfort caused by hemorrhoids. Use hemorrhoid cream if your health care provider approves.  If you develop varicose veins, wear support hose. Elevate your feet for 15 minutes, 3-4 times a day. Limit salt in your diet.  Avoid heavy lifting, wear low heel shoes, and practice good posture.  Rest with your legs elevated if you have leg cramps or low back pain.  Visit your dentist if you have not gone yet during your pregnancy. Use a soft toothbrush to brush your teeth and be gentle when you floss.  A sexual relationship may be continued unless your health care provider directs you otherwise.  Continue to go to all your prenatal visits as directed by your health care provider. SEEK MEDICAL CARE IF:   You have dizziness.  You have mild pelvic cramps, pelvic pressure, or nagging pain in the  abdominal area.  You have persistent nausea, vomiting, or diarrhea.  You have a bad smelling vaginal discharge.  You have pain with urination. SEEK IMMEDIATE MEDICAL CARE IF:   You have a fever.  You are leaking fluid from your vagina.  You have spotting or bleeding from your vagina.  You have severe abdominal cramping or pain.  You have rapid weight gain or loss.  You have shortness of breath with chest pain.  You notice sudden or extreme swelling of your face, hands, ankles, feet, or legs.  You have not felt your baby move in over an hour.  You have severe headaches that do not go away with medicine.  You have vision changes.   This information is not intended to replace advice given to you by your health care provider. Make sure you discuss any questions you have with your health care provider.   Document Released: 05/30/2001 Document Revised: 06/26/2014 Document Reviewed: 08/06/2012 Elsevier Interactive Patient Education [employer].

## 2016-02-16 DIAGNOSIS — O09299 Supervision of pregnancy with other poor reproductive or obstetric history, unspecified trimester: Secondary | ICD-10-CM | POA: Insufficient documentation

## 2016-02-16 DIAGNOSIS — T819XXA Unspecified complication of procedure, initial encounter: Secondary | ICD-10-CM | POA: Insufficient documentation

## 2016-02-16 NOTE — Progress Notes (Signed)
   PRENATAL VISIT NOTE  Subjective:  Darlene Grant is a 40 y.o. G2P1001 at [redacted]w[redacted]d being seen today for ongoing prenatal care.  She is currently monitored for the following issues for this high-risk pregnancy and has Anemia; Ligament tear; Pregnancy with history of cesarean section, antepartum; Calcification of right breast; Fibroadenoma of right breast; Supervision of normal pregnancy, antepartum; AMA (advanced maternal age) multigravida 35+; and Low lying posterior placenta, antepartum on her problem list.  Patient reports no complaints.  Contractions: Not present. Vag. Bleeding: None.  Movement: Present. Denies leaking of fluid.   Considering repeat C/S to to very long labor very difficult experience w/ last baby, but hesitant due to concerns about having had some sort of neurological event after C/S. Was confused, repeating herself, saying things that didn't make sense for the day after delivery. No weakness at the time or residual problems. Many questions about what could have caused it, if it could have been a reaction to anesthesia or other meds.   The following portions of the patient's history were reviewed and updated as appropriate: allergies, current medications, past family history, past medical history, past social history, past surgical history and problem list. Problem list updated.  Objective:   Vitals:   02/14/16 1012  BP: 106/71  Pulse: 79  Weight: 164 lb (74.4 kg)    Fetal Status: Fetal Heart Rate (bpm): 148 Fundal Height: 31 cm Movement: Present     General:  Alert, oriented and cooperative. Patient is in no acute distress.  Skin: Skin is warm and dry. No rash noted.   Cardiovascular: Normal heart rate noted  Respiratory: Normal respiratory effort, no problems with respiration noted  Abdomen: Soft, gravid, appropriate for gestational age. Pain/Pressure: Present     Pelvic:  Cervical exam deferred        Extremities: Normal range of motion.  Edema: Trace    Mental Status: Normal mood and affect. Normal behavior. Normal judgment and thought content.   Urinalysis: Urine Protein: Negative Urine Glucose: Negative  Assessment and Plan:  Pregnancy: G2P1001 at [redacted]w[redacted]d  1. Low lying posterior placenta, antepartum - F/U US scheduled.  - Continue Pelvic rest  2. AMA (advanced maternal age) multigravida 35+, third trimester - Growth [place] Q 4 weeks, Twice weekly testing at 36 weeks  3. Supervision of other high risk pregnancy, antepartum, third trimester - Anesthesia consult to discuss if events after last C/S could have been anesthesia-related and approach toward possible repeat C/S. In-basket message sent to Dr. Jillyn Hidden.   Preterm labor symptoms and general obstetric precautions including but not limited to vaginal bleeding, contractions, leaking of fluid and fetal movement were reviewed in detail with the patient. Please refer to After Visit Summary for other counseling recommendations.  Return in about 4 weeks (around 03/13/2016) for ROB.  Manya Silvas, CNM

## 2016-02-29 ENCOUNTER — Ambulatory Visit (HOSPITAL_COMMUNITY): Payer: BLUE CROSS/BLUE SHIELD

## 2016-02-29 ENCOUNTER — Ambulatory Visit (HOSPITAL_COMMUNITY)
Admission: RE | Admit: 2016-02-29 | Discharge: 2016-02-29 | Disposition: A | Discharge status: Home / Self Care | Payer: BLUE CROSS/BLUE SHIELD | Source: Ambulatory Visit | Attending: Obstetrics & Gynecology | Admitting: Obstetrics & Gynecology

## 2016-02-29 ENCOUNTER — Encounter (HOSPITAL_COMMUNITY): Payer: Self-pay

## 2016-02-29 ENCOUNTER — Other Ambulatory Visit: Payer: Self-pay | Admitting: Obstetrics & Gynecology

## 2016-02-29 DIAGNOSIS — O4442 Low lying placenta NOS or without hemorrhage, second trimester: Secondary | ICD-10-CM | POA: Diagnosis not present

## 2016-02-29 DIAGNOSIS — O4440 Low lying placenta NOS or without hemorrhage, unspecified trimester: Secondary | ICD-10-CM

## 2016-02-29 DIAGNOSIS — Z3A22 22 weeks gestation of pregnancy: Secondary | ICD-10-CM | POA: Diagnosis not present

## 2016-03-10 ENCOUNTER — Ambulatory Visit (INDEPENDENT_AMBULATORY_CARE_PROVIDER_SITE_OTHER): Payer: BLUE CROSS/BLUE SHIELD | Admitting: Obstetrics and Gynecology

## 2016-03-10 VITALS — BP 111/74 | HR 79 | Wt 173.0 lb

## 2016-03-10 DIAGNOSIS — O4442 Low lying placenta NOS or without hemorrhage, second trimester: Secondary | ICD-10-CM

## 2016-03-10 DIAGNOSIS — O4440 Low lying placenta NOS or without hemorrhage, unspecified trimester: Secondary | ICD-10-CM

## 2016-03-10 NOTE — Progress Notes (Signed)
   PRENATAL VISIT NOTE  Subjective:  Darlene Grant is a 40 y.o. G2P1001 at [redacted]w[redacted]d being seen today for ongoing prenatal care.  She is currently monitored for the following issues for this high-risk pregnancy and has Anemia; Ligament tear; Pregnancy with history of cesarean section, antepartum; Calcification of right breast; Fibroadenoma of right breast; Supervision of normal pregnancy, antepartum; AMA (advanced maternal age) multigravida 35+; Low lying posterior placenta, antepartum; Post-operative complication; and History of macrosomia in infant in prior pregnancy, currently pregnant on her problem list.  Patient reports no complaints.  Contractions: Not present. Vag. Bleeding: None.  Movement: Present. Denies leaking of fluid.  Leaning toward planned repeat C/S. Does not want any routine narcotics.  The following portions of the patient's history were reviewed and updated as appropriate: allergies, current medications, past family history, past medical history, past social history, past surgical history and problem list. Problem list updated.  Objective:   Vitals:   03/10/16 0949  BP: 111/74  Pulse: 79  Weight: 173 lb (78.5 kg)   CBC   Fetal Status: Fetal Heart Rate (bpm): 143   Movement: Present     General:  Alert, oriented and cooperative. Patient is in no acute distress.  Skin: Skin is warm and dry. No rash noted.   Cardiovascular: Normal heart rate noted  Respiratory: Normal respiratory effort, no problems with respiration noted  Abdomen: Soft, gravid, appropriate for gestational age. Pain/Pressure: Absent     Pelvic:  Cervical exam deferred        Extremities: Normal range of motion.  Edema: Trace  Mental Status: Normal mood and affect. Normal behavior. Normal judgment and thought content.   Urinalysis: Urine Protein: Negative Urine Glucose: Negative  Assessment and Plan:  Pregnancy: G2P1001 at [redacted]w[redacted]d  1. Low-lying placenta At 24 wks: 1.7cm from os  - [place]  MFM OB LIMITED; Future > pt declines scan for F/U low-lying placenta  Preterm labor symptoms and general obstetric precautions including but not limited to vaginal bleeding, contractions, leaking of fluid and fetal movement were reviewed in detail with the patient. Please refer to After Visit Summary for other counseling recommendations.  Return in about 1 month (around 04/09/2016).  Lorene Dy, CNM

## 2016-04-07 ENCOUNTER — Ambulatory Visit (INDEPENDENT_AMBULATORY_CARE_PROVIDER_SITE_OTHER): Payer: BLUE CROSS/BLUE SHIELD | Admitting: Advanced Practice Midwife

## 2016-04-07 VITALS — BP 108/60 | Wt 180.0 lb

## 2016-04-07 DIAGNOSIS — Z3493 Encounter for supervision of normal pregnancy, unspecified, third trimester: Secondary | ICD-10-CM | POA: Diagnosis not present

## 2016-04-07 DIAGNOSIS — O09523 Supervision of elderly multigravida, third trimester: Secondary | ICD-10-CM

## 2016-04-07 DIAGNOSIS — Z3480 Encounter for supervision of other normal pregnancy, unspecified trimester: Secondary | ICD-10-CM

## 2016-04-07 DIAGNOSIS — O09293 Supervision of pregnancy with other poor reproductive or obstetric history, third trimester: Secondary | ICD-10-CM

## 2016-04-07 DIAGNOSIS — O09299 Supervision of pregnancy with other poor reproductive or obstetric history, unspecified trimester: Secondary | ICD-10-CM

## 2016-04-07 DIAGNOSIS — Z3490 Encounter for supervision of normal pregnancy, unspecified, unspecified trimester: Secondary | ICD-10-CM

## 2016-04-07 DIAGNOSIS — G9782 Other postprocedural complications and disorders of nervous system: Secondary | ICD-10-CM

## 2016-04-07 LAB — CBC
HEMATOCRIT: 32.4 % — AB (ref 35.0–45.0)
Hemoglobin: 11.2 g/dL — ABNORMAL LOW (ref 11.7–15.5)
MCH: 32.8 pg (ref 27.0–33.0)
MCHC: 34.6 g/dL (ref 32.0–36.0)
MCV: 95 fL (ref 80.0–100.0)
MPV: 10.1 fL (ref 7.5–12.5)
PLATELETS: 213 10*3/uL (ref 140–400)
RBC: 3.41 MIL/uL — ABNORMAL LOW (ref 3.80–5.10)
RDW: 13.3 % (ref 11.0–15.0)
WBC: 11.1 10*3/uL — ABNORMAL HIGH (ref 3.8–10.8)

## 2016-04-08 LAB — RPR

## 2016-04-08 LAB — GLUCOSE TOLERANCE, 1 HOUR (50G) W/O FASTING: Glucose, 1 Hr, gestational: 109 mg/dL (ref <140)

## 2016-04-08 LAB — HIV ANTIBODY (ROUTINE TESTING W REFLEX): HIV 1&2 Ab, 4th Generation: NONREACTIVE

## 2016-04-10 NOTE — Progress Notes (Signed)
   PRENATAL VISIT NOTE  Subjective:  Darlene Grant is a 40 y.o. G2P1001 at [redacted]w[redacted]d being seen today for ongoing prenatal care.  She is currently monitored for the following issues for this high-risk pregnancy and has Ligament tear; Pregnancy with history of cesarean section, antepartum; Calcification of right breast; Fibroadenoma of right breast; Supervision of normal pregnancy, antepartum; AMA (advanced maternal age) multigravida 35+; Low lying posterior placenta, antepartum; Post-operative complication; and History of macrosomia in infant in prior pregnancy, currently pregnant on her problem list.  Patient reports no complaints.  Contractions: Not present. Vag. Bleeding: None.  Movement: Present. Denies leaking of fluid.   The following portions of the patient's history were reviewed and updated as appropriate: allergies, current medications, past family history, past medical history, past social history, past surgical history and problem list. Problem list updated.  Objective:   Vitals:   04/07/16 1012  BP: 108/60  Weight: 180 lb (81.6 kg)    Fetal Status: Fetal Heart Rate (bpm): 141   Movement: Present     General:  Alert, oriented and cooperative. Patient is in no acute distress.  Skin: Skin is warm and dry. No rash noted.   Cardiovascular: Normal heart rate noted  Respiratory: Normal respiratory effort, no problems with respiration noted  Abdomen: Soft, gravid, appropriate for gestational age. Pain/Pressure: Absent     Pelvic:  Cervical exam deferred        Extremities: Normal range of motion.  Edema: Trace  Mental Status: Normal mood and affect. Normal behavior. Normal judgment and thought content.   Assessment and Plan:  Pregnancy: G2P1001 at [redacted]w[redacted]d  1. Normal pregnancy, unspecified trimester  - Glucose Tolerance, 1 HR (50g) - CBC - HIV antibody (with reflex) - RPR  2. Other postoperative complication involving nervous system   3. Elderly multigravida in  third trimester   4. History of macrosomia in infant in prior pregnancy, currently pregnant   5. Supervision of other normal pregnancy, antepartum   Preterm labor symptoms and general obstetric precautions including but not limited to vaginal bleeding, contractions, leaking of fluid and fetal movement were reviewed in detail with the patient. Please refer to After Visit Summary for other counseling recommendations.  Return in about 2 weeks (around 04/21/2016) for ROB.  Manya Silvas, CNM

## 2016-04-20 ENCOUNTER — Ambulatory Visit (INDEPENDENT_AMBULATORY_CARE_PROVIDER_SITE_OTHER): Payer: BLUE CROSS/BLUE SHIELD | Admitting: Obstetrics & Gynecology

## 2016-04-20 VITALS — BP 116/72 | HR 87 | Wt 184.0 lb

## 2016-04-20 DIAGNOSIS — O34219 Maternal care for unspecified type scar from previous cesarean delivery: Secondary | ICD-10-CM

## 2016-04-20 DIAGNOSIS — O4440 Low lying placenta NOS or without hemorrhage, unspecified trimester: Secondary | ICD-10-CM

## 2016-04-20 DIAGNOSIS — Z3480 Encounter for supervision of other normal pregnancy, unspecified trimester: Secondary | ICD-10-CM

## 2016-04-20 DIAGNOSIS — O09523 Supervision of elderly multigravida, third trimester: Secondary | ICD-10-CM

## 2016-04-20 NOTE — Patient Instructions (Signed)
Levonorgestrel intrauterine device (IUD) What is this medicine? LEVONORGESTREL IUD (LEE voe nor jes trel) is a contraceptive (birth control) device. The device is placed inside the uterus by a healthcare professional. It is used to prevent pregnancy and can also be used to treat heavy bleeding that occurs during your period. Depending on the device, it can be used for 3 to 5 years. This medicine may be used for other purposes; ask your health care provider or pharmacist if you have questions. What should I tell my health care provider before I take this medicine? They need to know if you have any of these conditions: -abnormal Pap smear -cancer of the breast, uterus, or cervix -diabetes -endometritis -genital or pelvic infection now or in the past -have more than one sexual partner or your partner has more than one partner -heart disease -history of an ectopic or tubal pregnancy -immune system problems -IUD in place -liver disease or tumor -problems with blood clots or take blood-thinners -use intravenous drugs -uterus of unusual shape -vaginal bleeding that has not been explained -an unusual or allergic reaction to levonorgestrel, other hormones, silicone, or polyethylene, medicines, foods, dyes, or preservatives -pregnant or trying to get pregnant -breast-feeding How should I use this medicine? This device is placed inside the uterus by a health care professional. Talk to your pediatrician regarding the use of this medicine in children. Special care may be needed. Overdosage: If you think you have taken too much of this medicine contact a poison control center or emergency room at once. NOTE: This medicine is only for you. Do not share this medicine with others. What if I miss a dose? This does not apply. What may interact with this medicine? Do not take this medicine with any of the following medications: -amprenavir -bosentan -fosamprenavir This medicine may also interact  with the following medications: -aprepitant -barbiturate medicines for inducing sleep or treating seizures -bexarotene -griseofulvin -medicines to treat seizures like carbamazepine, ethotoin, felbamate, oxcarbazepine, phenytoin, topiramate -modafinil -pioglitazone -rifabutin -rifampin -rifapentine -some medicines to treat HIV infection like atazanavir, indinavir, lopinavir, nelfinavir, tipranavir, ritonavir -St. John's wort -warfarin This list may not describe all possible interactions. Give your health care provider a list of all the medicines, herbs, non-prescription drugs, or dietary supplements you use. Also tell them if you smoke, drink alcohol, or use illegal drugs. Some items may interact with your medicine. What should I watch for while using this medicine? Visit your doctor or health care professional for regular check ups. See your doctor if you or your partner has sexual contact with others, becomes HIV positive, or gets a sexual transmitted disease. This product does not protect you against HIV infection (AIDS) or other sexually transmitted diseases. You can check the placement of the IUD yourself by reaching up to the top of your vagina with clean fingers to feel the threads. Do not pull on the threads. It is a good habit to check placement after each menstrual period. Call your doctor right away if you feel more of the IUD than just the threads or if you cannot feel the threads at all. The IUD may come out by itself. You may become pregnant if the device comes out. If you notice that the IUD has come out use a backup birth control method like condoms and call your health care provider. Using tampons will not change the position of the IUD and are okay to use during your period. What side effects may I notice from receiving this  medicine? Side effects that you should report to your doctor or health care professional as soon as possible: -allergic reactions like skin rash, itching  or hives, swelling of the face, lips, or tongue -fever, flu-like symptoms -genital sores -high blood pressure -no menstrual period for 6 weeks during use -pain, swelling, warmth in the leg -pelvic pain or tenderness -severe or sudden headache -signs of pregnancy -stomach cramping -sudden shortness of breath -trouble with balance, talking, or walking -unusual vaginal bleeding, discharge -yellowing of the eyes or skin Side effects that usually do not require medical attention (report to your doctor or health care professional if they continue or are bothersome): -acne -breast pain -change in sex drive or performance -changes in weight -cramping, dizziness, or faintness while the device is being inserted -headache -irregular menstrual bleeding within first 3 to 6 months of use -nausea This list may not describe all possible side effects. Call your doctor for medical advice about side effects. You may report side effects to FDA at 1-800-FDA-1088. Where should I keep my medicine? This does not apply. NOTE: This sheet is a summary. It may not cover all possible information. If you have questions about this medicine, talk to your doctor, pharmacist, or health care provider.    2016, Elsevier/Gold Standard. (2011-07-06 13:54:04) Sterilization Information, Female Female sterilization is a procedure to permanently prevent pregnancy. There are different ways to perform sterilization, but all either block or close the fallopian tubes so that your eggs cannot reach your uterus. If your egg cannot reach your uterus, sperm cannot fertilize the egg, and you cannot get pregnant.  Sterilization is performed by a surgical procedure. Sometimes these procedures are performed in a hospital while a patient is asleep. Sometimes they can be done in a clinic setting with the patient awake. The fallopian tubes can be surgically cut, tied, or sealed through a procedure called tubal ligation. The fallopian tubes  can also be closed with clips or rings. Sterilization can also be done by placing a tiny coil into each fallopian tube, which causes scar tissue to grow inside the tube. The scar tissue then blocks the tubes.  Discuss sterilization with your caregiver to answer any concerns you or your partner may have. You may want to ask what type of sterilization your caregiver performs. Some caregivers may not perform all the various options. Sterilization is permanent and should only be done if you are sure you do not want children or do not want any more children. Having a sterilization reversed may not be successful.  STERILIZATION PROCEDURES  Laparoscopic sterilization. This is a surgical method performed at a time other than right after childbirth. Two incisions are made in the lower abdomen. A thin, lighted tube (laparoscope) is inserted into one of the incisions and is used to perform the procedure. The fallopian tubes are closed with a ring or a clip. An instrument that uses heat could be used to seal the tubes closed (electrocautery).   Mini-laparotomy. This is a surgical method done 1 or 2 days after giving birth. Typically, a small incision is made just below the belly button (umbilicus) and the fallopian tubes are exposed. The tubes can then be sealed, tied, or cut.   Hysteroscopic sterilization. This is performed at a time other than right after childbirth. A tiny, spring-like coil is inserted through the cervix and uterus and placed into the fallopian tubes. The coil causes scaring and blocks the tubes. Other forms of contraception should be used for 3 months  after the procedure to allow the scar tissue to form completely. Additionally, it is required hysterosalpingography be done 3 months later to ensure that the procedure was successful. Hysterosalpingography is a procedure that uses X-rays to look at your uterus and fallopian tubes after a material to make them show up better has been inserted. IS  STERILIZATION SAFE? Sterilization is considered safe with very rare complications. Risks depend on the type of procedure you have. As with any surgical procedure, there are risks. Some risks of sterilization by any means include:   Bleeding.  Infection.  Reaction to anesthesia medicine.  Injury to surrounding organs. Risks specific to having hysteroscopic coils placed include:  The coils may not be placed correctly the first time.   The coils may move out of place.   The tubes may not get completely blocked after 3 months.   Injury to surrounding organs when placing the coil.  HOW EFFECTIVE IS FEMALE STERILIZATION? Sterilization is nearly 100% effective, but it can fail. Depending on the type of sterilization, the rate of failure can be as high as 3%. After hysteroscopic sterilization with placement of fallopian tube coils, you will need back-up birth control for 3 months after the procedure. Sterilization is effective for a lifetime.  BENEFITS OF STERILIZATION  It does not affect your hormones, and therefore will not affect your menstrual periods, sexual desire, or performance.   It is effective for a lifetime.   It is safe.   You do not need to worry about getting pregnant. Keep in mind that if you had the hysteroscopic placement procedure, you must wait 3 months after the procedure (or until your caregiver confirms) before pregnancy is not considered possible.   There are no side effects unlike other types of birth control (contraception).  DRAWBACKS OF STERILIZATION  You must be sure you do not want children or any more children. The procedure is permanent.   It does not provide protection against sexually transmitted infections (STIs).   The tubes can grow back together. If this happens, there is a risk of pregnancy. There is also an increased risk (50%) of pregnancy being an ectopic pregnancy. This is a pregnancy that happens outside of the uterus.   This  information is not intended to replace advice given to you by your health care provider. Make sure you discuss any questions you have with your health care provider.   Document Released: 11/22/2007 Document Revised: 06/10/2013 Document Reviewed: 09/21/2011 Elsevier Interactive Patient Education [employer].

## 2016-04-22 NOTE — Progress Notes (Signed)
   PRENATAL VISIT NOTE  Subjective:  Darlene Grant is a 40 y.o. G2P1001 at [redacted]w[redacted]d being seen today for ongoing prenatal care.  She is currently monitored for the following issues for this high-risk pregnancy and has Ligament tear; Pregnancy with history of cesarean section, antepartum; Calcification of right breast; Fibroadenoma of right breast; Supervision of normal pregnancy, antepartum; AMA (advanced maternal age) multigravida 35+; Low lying posterior placenta, antepartum; Post-operative complication; and History of macrosomia in infant in prior pregnancy, currently pregnant on her problem list.  Patient reports no complaints.  Contractions: Not present. Vag. Bleeding: None.  Movement: Present. Denies leaking of fluid.   The following portions of the patient's history were reviewed and updated as appropriate: allergies, current medications, past family history, past medical history, past social history, past surgical history and problem list. Problem list updated.  Objective:   Vitals:   04/20/16 1546  BP: 116/72  Pulse: 87  Weight: 184 lb (83.5 kg)    Fetal Status: Fetal Heart Rate (bpm): 145   Movement: Present     General:  Alert, oriented and cooperative. Patient is in no acute distress.  Skin: Skin is warm and dry. No rash noted.   Cardiovascular: Normal heart rate noted  Respiratory: Normal respiratory effort, no problems with respiration noted  Abdomen: Soft, gravid, appropriate for gestational age. Pain/Pressure: Absent     Pelvic:  Cervical exam deferred        Extremities: Normal range of motion.  Edema: None  Mental Status: Normal mood and affect. Normal behavior. Normal judgment and thought content.   Assessment and Plan:  Pregnancy: G2P1001 at [redacted]w[redacted]d  1. Low lying posterior placenta, antepartum -pt opts for not repeating US.  Pt knows that vaginal rest is recommended.  No bleeding.  2. Pregnancy with history of cesarean section, antepartum -wants rpt c/s  and probable BTL  3. Elderly multigravida in third trimester -will monitor for pre E closely; growth [place] as clinically indicated  Preterm labor symptoms and general obstetric precautions including but not limited to vaginal bleeding, contractions, leaking of fluid and fetal movement were reviewed in detail with the patient. Please refer to After Visit Summary for other counseling recommendations.   RTC 2 weeks  Guss Bunde, MD

## 2016-05-04 ENCOUNTER — Ambulatory Visit (INDEPENDENT_AMBULATORY_CARE_PROVIDER_SITE_OTHER): Payer: BLUE CROSS/BLUE SHIELD | Admitting: Obstetrics & Gynecology

## 2016-05-04 VITALS — BP 125/76 | HR 86 | Temp 93.7°F | Wt 187.0 lb

## 2016-05-04 DIAGNOSIS — O09299 Supervision of pregnancy with other poor reproductive or obstetric history, unspecified trimester: Secondary | ICD-10-CM

## 2016-05-04 DIAGNOSIS — O34219 Maternal care for unspecified type scar from previous cesarean delivery: Secondary | ICD-10-CM

## 2016-05-04 DIAGNOSIS — Z3480 Encounter for supervision of other normal pregnancy, unspecified trimester: Secondary | ICD-10-CM

## 2016-05-04 DIAGNOSIS — O09293 Supervision of pregnancy with other poor reproductive or obstetric history, third trimester: Secondary | ICD-10-CM

## 2016-05-04 DIAGNOSIS — O09523 Supervision of elderly multigravida, third trimester: Secondary | ICD-10-CM

## 2016-05-04 NOTE — Progress Notes (Signed)
   PRENATAL VISIT NOTE  Subjective:  Darlene Grant is a 40 y.o. G2P1001 at [redacted]w[redacted]d being seen today for ongoing prenatal care.  She is currently monitored for the following issues for this low-risk pregnancy and has Ligament tear; Pregnancy with history of cesarean section, antepartum; Calcification of right breast; Fibroadenoma of right breast; Supervision of normal pregnancy, antepartum; AMA (advanced maternal age) multigravida 35+; Low lying posterior placenta, antepartum; Post-operative complication; and History of macrosomia in infant in prior pregnancy, currently pregnant on her problem list.  Patient reports no complaints.  Contractions: Not present. Vag. Bleeding: None.  Movement: Present. Denies leaking of fluid.   The following portions of the patient's history were reviewed and updated as appropriate: allergies, current medications, past family history, past medical history, past social history, past surgical history and problem list. Problem list updated.  Objective:   Vitals:   05/04/16 1559  BP: 125/76  Pulse: 86  Temp: (!) 93.7 F (34.3 C)  Weight: 187 lb (84.8 kg)    Fetal Status: Fetal Heart Rate (bpm): 144   Movement: Present     General:  Alert, oriented and cooperative. Patient is in no acute distress.  Skin: Skin is warm and dry. No rash noted.   Cardiovascular: Normal heart rate noted  Respiratory: Normal respiratory effort, no problems with respiration noted  Abdomen: Soft, gravid, appropriate for gestational age. Pain/Pressure: Absent     Pelvic:  Cervical exam deferred        Extremities: Normal range of motion.  Edema: None  Mental Status: Normal mood and affect. Normal behavior. Normal judgment and thought content.   Assessment and Plan:  Pregnancy: G2P1001 at [redacted]w[redacted]d  1. Elderly multigravida in third trimester   2. History of macrosomia in infant in prior pregnancy, currently pregnant   3. Pregnancy with history of cesarean section,  antepartum   4. Supervision of other normal pregnancy, antepartum   Preterm labor symptoms and general obstetric precautions including but not limited to vaginal bleeding, contractions, leaking of fluid and fetal movement were reviewed in detail with the patient. Please refer to After Visit Summary for other counseling recommendations.  No Follow-up on file.   Emily Filbert, MD

## 2016-05-08 ENCOUNTER — Encounter (HOSPITAL_COMMUNITY): Payer: Self-pay | Admitting: *Deleted

## 2016-05-15 ENCOUNTER — Encounter: Payer: BLUE CROSS/BLUE SHIELD | Admitting: Obstetrics & Gynecology

## 2016-05-18 ENCOUNTER — Encounter: Payer: Self-pay | Admitting: Obstetrics & Gynecology

## 2016-05-18 ENCOUNTER — Ambulatory Visit (INDEPENDENT_AMBULATORY_CARE_PROVIDER_SITE_OTHER): Payer: BLUE CROSS/BLUE SHIELD | Admitting: Obstetrics & Gynecology

## 2016-05-18 VITALS — BP 109/74 | HR 92 | Wt 190.0 lb

## 2016-05-18 DIAGNOSIS — O34219 Maternal care for unspecified type scar from previous cesarean delivery: Secondary | ICD-10-CM

## 2016-05-18 DIAGNOSIS — Z3480 Encounter for supervision of other normal pregnancy, unspecified trimester: Secondary | ICD-10-CM

## 2016-05-18 DIAGNOSIS — O09523 Supervision of elderly multigravida, third trimester: Secondary | ICD-10-CM

## 2016-05-18 DIAGNOSIS — N644 Mastodynia: Secondary | ICD-10-CM

## 2016-05-18 MED ORDER — AMBULATORY NON FORMULARY MEDICATION: 1.0000 | 5 refills | Status: DC | PRN
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
## 2016-06-01 ENCOUNTER — Ambulatory Visit (INDEPENDENT_AMBULATORY_CARE_PROVIDER_SITE_OTHER): Payer: BLUE CROSS/BLUE SHIELD | Admitting: Obstetrics & Gynecology

## 2016-06-01 VITALS — BP 125/81 | HR 87 | Wt 193.0 lb

## 2016-06-01 DIAGNOSIS — Z113 Encounter for screening for infections with a predominantly sexual mode of transmission: Secondary | ICD-10-CM | POA: Diagnosis not present

## 2016-06-01 DIAGNOSIS — O09523 Supervision of elderly multigravida, third trimester: Secondary | ICD-10-CM | POA: Diagnosis not present

## 2016-06-01 DIAGNOSIS — Z3480 Encounter for supervision of other normal pregnancy, unspecified trimester: Secondary | ICD-10-CM

## 2016-06-03 ENCOUNTER — Encounter: Payer: Self-pay | Admitting: Obstetrics & Gynecology

## 2016-06-03 NOTE — Progress Notes (Signed)
   PRENATAL VISIT NOTE  Subjective:  Darlene Grant is a 40 y.o. G2P1001 at [redacted]w[redacted]d being seen today for ongoing prenatal care.  She is currently monitored for the following issues for this high-risk pregnancy and has Ligament tear; Pregnancy with history of cesarean section, antepartum; Calcification of right breast; Fibroadenoma of right breast; Supervision of normal pregnancy, antepartum; AMA (advanced maternal age) multigravida 35+; Low lying posterior placenta, antepartum; Post-operative complication; and History of macrosomia in infant in prior pregnancy, currently pregnant on her problem list.  Patient reports no complaints.  Contractions: Irritability. Vag. Bleeding: None.  Movement: Present. Denies leaking of fluid.   The following portions of the patient's history were reviewed and updated as appropriate: allergies, current medications, past family history, past medical history, past social history, past surgical history and problem list. Problem list updated.  Objective:   Vitals:   06/01/16 1557  BP: 125/81  Pulse: 87  Weight: 193 lb (87.5 kg)    Fetal Status: Fetal Heart Rate (bpm): 138 Fundal Height: 35 cm Movement: Present     General:  Alert, oriented and cooperative. Patient is in no acute distress.  Skin: Skin is warm and dry. No rash noted.   Cardiovascular: Normal heart rate noted  Respiratory: Normal respiratory effort, no problems with respiration noted  Abdomen: Soft, gravid, appropriate for gestational age. Pain/Pressure: Absent     Pelvic:  Cervical exam deferred        Extremities: Normal range of motion.  Edema: Trace  Mental Status: Normal mood and affect. Normal behavior. Normal judgment and thought content.   Assessment and Plan:  Pregnancy: G2P1001 at [redacted]w[redacted]d  1. Elderly multigravida in third trimester - Culture, beta strep (group b only) - Urine cytology ancillary only - Had [facility] on last [place] and has declined f/u US.  2. Supervision of other  normal pregnancy, antepartum -Working on having Q pump for patient.  [place] to see if he is available.  Term labor symptoms and general obstetric precautions including but not limited to vaginal bleeding, contractions, leaking of fluid and fetal movement were reviewed in detail with the patient. Please refer to After Visit Summary for other counseling recommendations.  Return in about 1 week (around 06/08/2016).   Guss Bunde, MD

## 2016-06-04 LAB — CULTURE, BETA STREP (GROUP B ONLY)

## 2016-06-05 LAB — URINE CYTOLOGY ANCILLARY ONLY
Chlamydia: NEGATIVE
Neisseria Gonorrhea: NEGATIVE

## 2016-06-08 ENCOUNTER — Ambulatory Visit (INDEPENDENT_AMBULATORY_CARE_PROVIDER_SITE_OTHER): Payer: BLUE CROSS/BLUE SHIELD | Admitting: Obstetrics & Gynecology

## 2016-06-08 VITALS — BP 122/81 | HR 94 | Wt 195.0 lb

## 2016-06-08 DIAGNOSIS — Z3480 Encounter for supervision of other normal pregnancy, unspecified trimester: Secondary | ICD-10-CM

## 2016-06-08 DIAGNOSIS — O34219 Maternal care for unspecified type scar from previous cesarean delivery: Secondary | ICD-10-CM

## 2016-06-08 DIAGNOSIS — Z3483 Encounter for supervision of other normal pregnancy, third trimester: Secondary | ICD-10-CM
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
## 2016-06-08 NOTE — Patient Instructions (Signed)

## 2016-06-14 ENCOUNTER — Encounter (HOSPITAL_COMMUNITY): Payer: Self-pay

## 2016-06-15 ENCOUNTER — Ambulatory Visit (INDEPENDENT_AMBULATORY_CARE_PROVIDER_SITE_OTHER): Payer: BLUE CROSS/BLUE SHIELD | Admitting: Obstetrics & Gynecology

## 2016-06-15 VITALS — BP 117/79 | HR 108 | Wt 197.0 lb

## 2016-06-15 DIAGNOSIS — Z3480 Encounter for supervision of other normal pregnancy, unspecified trimester: Secondary | ICD-10-CM

## 2016-06-15 DIAGNOSIS — O34219 Maternal care for unspecified type scar from previous cesarean delivery: Secondary | ICD-10-CM

## 2016-06-15 DIAGNOSIS — O09523 Supervision of elderly multigravida, third trimester: Secondary | ICD-10-CM

## 2016-06-15 NOTE — Progress Notes (Signed)
   PRENATAL VISIT NOTE  Subjective:  Darlene Grant is a 40 y.o. G2P1001 at [redacted]w[redacted]d being seen today for ongoing prenatal care.  She is currently monitored for the following issues for this low-risk pregnancy and has Ligament tear; Pregnancy with history of cesarean section, antepartum; Calcification of right breast; Fibroadenoma of right breast; Supervision of normal pregnancy, antepartum; AMA (advanced maternal age) multigravida 35+; Low lying posterior placenta, antepartum; Post-operative complication; and History of macrosomia in infant in prior pregnancy, currently pregnant on her problem list.  Patient reports no complaints.  Contractions: Irritability. Vag. Bleeding: None.  Movement: Present. Denies leaking of fluid.   The following portions of the patient's history were reviewed and updated as appropriate: allergies, current medications, past family history, past medical history, past social history, past surgical history and problem list. Problem list updated.  Objective:   Vitals:   06/15/16 1531  BP: 117/79  Pulse: (!) 108  Weight: 197 lb (89.4 kg)    Fetal Status:     Movement: Present     General:  Alert, oriented and cooperative. Patient is in no acute distress.  Skin: Skin is warm and dry. No rash noted.   Cardiovascular: Normal heart rate noted  Respiratory: Normal respiratory effort, no problems with respiration noted  Abdomen: Soft, gravid, appropriate for gestational age. Pain/Pressure: Present     Pelvic:  Cervical exam deferred        Extremities: Normal range of motion.  Edema: Trace  Mental Status: Normal mood and affect. Normal behavior. Normal judgment and thought content.   Assessment and Plan:  Pregnancy: G2P1001 at [redacted]w[redacted]d  1. Elderly multigravida in third trimester   2. Pregnancy with history of cesarean section, antepartum - Repeat c/s 05-23-16  3. Supervision of other normal pregnancy, antepartum   Term labor symptoms and general  obstetric precautions including but not limited to vaginal bleeding, contractions, leaking of fluid and fetal movement were reviewed in detail with the patient. Please refer to After Visit Summary for other counseling recommendations.  Return in about 1 week (around 06/22/2016).   Emily Filbert, MD

## 2016-06-22 ENCOUNTER — Encounter (HOSPITAL_COMMUNITY)
Admission: RE | Admit: 2016-06-22 | Discharge: 2016-06-22 | Disposition: A | Discharge status: Home / Self Care | Payer: BLUE CROSS/BLUE SHIELD | Source: Ambulatory Visit | Admitting: Obstetrics & Gynecology

## 2016-06-22 HISTORY — DX: Unspecified abnormal cytological findings in specimens from vagina: R87.629

## 2016-06-23 ENCOUNTER — Inpatient Hospital Stay (HOSPITAL_COMMUNITY): Payer: BLUE CROSS/BLUE SHIELD | Admitting: Anesthesiology

## 2016-06-23 ENCOUNTER — Encounter (HOSPITAL_COMMUNITY): Payer: Self-pay

## 2016-06-23 ENCOUNTER — Encounter (HOSPITAL_COMMUNITY)
Admission: RE | Disposition: A | Discharge status: Home / Self Care | Payer: Self-pay | Source: Ambulatory Visit | Attending: Obstetrics & Gynecology

## 2016-06-23 ENCOUNTER — Inpatient Hospital Stay (HOSPITAL_COMMUNITY)
Admission: RE | Admit: 2016-06-23 | Discharge: 2016-06-25 | DRG: 766 | Disposition: A | Discharge status: Home / Self Care | Payer: BLUE CROSS/BLUE SHIELD | Source: Ambulatory Visit | Attending: Obstetrics & Gynecology | Admitting: Obstetrics & Gynecology

## 2016-06-23 DIAGNOSIS — Z3A39 39 weeks gestation of pregnancy: Secondary | ICD-10-CM | POA: Diagnosis not present

## 2016-06-23 DIAGNOSIS — O34219 Maternal care for unspecified type scar from previous cesarean delivery: Secondary | ICD-10-CM | POA: Diagnosis not present

## 2016-06-23 DIAGNOSIS — O34211 Maternal care for low transverse scar from previous cesarean delivery: Principal | ICD-10-CM | POA: Diagnosis present

## 2016-06-23 DIAGNOSIS — Z9889 Other specified postprocedural states: Secondary | ICD-10-CM

## 2016-06-23 LAB — CBC
HEMATOCRIT: 34.6 % — AB (ref 36.0–46.0)
HEMOGLOBIN: 11.7 g/dL — AB (ref 12.0–15.0)
MCH: 32 pg (ref 26.0–34.0)
MCHC: 33.8 g/dL (ref 30.0–36.0)
MCV: 94.5 fL (ref 78.0–100.0)
Platelets: 175 10*3/uL (ref 150–400)
RBC: 3.66 MIL/uL — ABNORMAL LOW (ref 3.87–5.11)
RDW: 14.4 % (ref 11.5–15.5)
WBC: 9.3 10*3/uL (ref 4.0–10.5)

## 2016-06-23 LAB — ABO/RH: ABO/RH(D): A POS

## 2016-06-23 LAB — TYPE AND SCREEN
ABO/RH(D): A POS
Antibody Screen: NEGATIVE

## 2016-06-23 SURGERY — Surgical Case
Anesthesia: Spinal | Site: Abdomen

## 2016-06-23 MED ORDER — OXYTOCIN 10 UNIT/ML IJ SOLN
INTRAVENOUS | Status: DC | PRN
  Administered 2016-06-23: 40 [IU] via INTRAVENOUS

## 2016-06-23 MED ORDER — SCOPOLAMINE 1 MG/3DAYS TD PT72
MEDICATED_PATCH | TRANSDERMAL | Status: AC
  Administered 2016-06-23: 1.5 mg via TRANSDERMAL
  Filled 2016-06-23: qty 1

## 2016-06-23 MED ORDER — PHENYLEPHRINE 40 MCG/ML (10ML) SYRINGE FOR IV PUSH (FOR BLOOD PRESSURE SUPPORT)
PREFILLED_SYRINGE | INTRAVENOUS | Status: AC
  Filled 2016-06-23: qty 10

## 2016-06-23 MED ORDER — NALOXONE HCL 0.4 MG/ML IJ SOLN: 0.4000 mg | INTRAMUSCULAR | Status: DC | PRN

## 2016-06-23 MED ORDER — DIPHENHYDRAMINE HCL 50 MG/ML IJ SOLN: 12.5000 mg | INTRAMUSCULAR | Status: DC | PRN

## 2016-06-23 MED ORDER — ZOLPIDEM TARTRATE 5 MG PO TABS: 5.0000 mg | ORAL_TABLET | Freq: Every evening | ORAL | Status: DC | PRN

## 2016-06-23 MED ORDER — WITCH HAZEL-GLYCERIN EX PADS: 1.0000 "application " | MEDICATED_PAD | CUTANEOUS | Status: DC | PRN

## 2016-06-23 MED ORDER — ACETAMINOPHEN 325 MG PO TABS
650.0000 mg | ORAL_TABLET | ORAL | Status: DC | PRN
  Administered 2016-06-23 – 2016-06-25 (×5): 650 mg via ORAL
  Filled 2016-06-23 (×5): qty 2

## 2016-06-23 MED ORDER — IBUPROFEN 600 MG PO TABS
600.0000 mg | ORAL_TABLET | Freq: Four times a day (QID) | ORAL | Status: DC
  Administered 2016-06-25: 600 mg via ORAL
  Filled 2016-06-23 (×2): qty 1

## 2016-06-23 MED ORDER — TETANUS-DIPHTH-ACELL PERTUSSIS 5-2.5-18.5 LF-MCG/0.5 IM SUSP: 0.5000 mL | Freq: Once | INTRAMUSCULAR | Status: DC

## 2016-06-23 MED ORDER — CEFAZOLIN SODIUM-DEXTROSE 2-3 GM-% IV SOLR
INTRAVENOUS | Status: DC | PRN
  Administered 2016-06-23: 2 g via INTRAVENOUS

## 2016-06-23 MED ORDER — KETOROLAC TROMETHAMINE 30 MG/ML IJ SOLN: 30.0000 mg | Freq: Once | INTRAMUSCULAR | Status: DC

## 2016-06-23 MED ORDER — SODIUM CHLORIDE 0.9% FLUSH: 3.0000 mL | INTRAVENOUS | Status: DC | PRN

## 2016-06-23 MED ORDER — INFLUENZA VAC SPLIT QUAD 0.5 ML IM SUSY
0.5000 mL | PREFILLED_SYRINGE | INTRAMUSCULAR | Status: AC
  Administered 2016-06-25: 0.5 mL via INTRAMUSCULAR
  Filled 2016-06-23: qty 0.5

## 2016-06-23 MED ORDER — LACTATED RINGERS IV SOLN
INTRAVENOUS | Status: DC | PRN
  Administered 2016-06-23: 13:00:00 via INTRAVENOUS

## 2016-06-23 MED ORDER — BUPIVACAINE HCL (PF) 0.5 % IJ SOLN
INTRAMUSCULAR | Status: AC
  Filled 2016-06-23: qty 30

## 2016-06-23 MED ORDER — COCONUT OIL OIL: 1.0000 "application " | TOPICAL_OIL | Status: DC | PRN

## 2016-06-23 MED ORDER — PHENYLEPHRINE 8 MG IN D5W 100 ML (0.08MG/ML) PREMIX OPTIME
INJECTION | INTRAVENOUS | Status: DC | PRN
  Administered 2016-06-23: 60 ug/min via INTRAVENOUS

## 2016-06-23 MED ORDER — KETOROLAC TROMETHAMINE 30 MG/ML IJ SOLN: 30.0000 mg | Freq: Four times a day (QID) | INTRAMUSCULAR | Status: AC | PRN

## 2016-06-23 MED ORDER — SIMETHICONE 80 MG PO CHEW
80.0000 mg | CHEWABLE_TABLET | Freq: Three times a day (TID) | ORAL | Status: DC
  Administered 2016-06-24 – 2016-06-25 (×3): 80 mg via ORAL
  Filled 2016-06-23 (×9): qty 1

## 2016-06-23 MED ORDER — DEXAMETHASONE SODIUM PHOSPHATE 4 MG/ML IJ SOLN
INTRAMUSCULAR | Status: DC | PRN
  Administered 2016-06-23: 4 mg via INTRAVENOUS

## 2016-06-23 MED ORDER — ACETAMINOPHEN 500 MG PO TABS
1000.0000 mg | ORAL_TABLET | Freq: Four times a day (QID) | ORAL | Status: AC
  Administered 2016-06-24: 1000 mg via ORAL
  Filled 2016-06-23: qty 2

## 2016-06-23 MED ORDER — OXYTOCIN 10 UNIT/ML IJ SOLN
INTRAMUSCULAR | Status: AC
  Filled 2016-06-23: qty 4

## 2016-06-23 MED ORDER — ONDANSETRON HCL 4 MG/2ML IJ SOLN
INTRAMUSCULAR | Status: DC | PRN
  Administered 2016-06-23: 4 mg via INTRAVENOUS

## 2016-06-23 MED ORDER — DIPHENHYDRAMINE HCL 25 MG PO CAPS
25.0000 mg | ORAL_CAPSULE | ORAL | Status: DC | PRN
  Filled 2016-06-23: qty 1

## 2016-06-23 MED ORDER — SCOPOLAMINE 1 MG/3DAYS TD PT72
1.0000 | MEDICATED_PATCH | Freq: Once | TRANSDERMAL | Status: DC
  Administered 2016-06-23: 1.5 mg via TRANSDERMAL

## 2016-06-23 MED ORDER — MENTHOL 3 MG MT LOZG: 1.0000 | LOZENGE | OROMUCOSAL | Status: DC | PRN

## 2016-06-23 MED ORDER — SIMETHICONE 80 MG PO CHEW
80.0000 mg | CHEWABLE_TABLET | ORAL | Status: DC | PRN
  Administered 2016-06-23: 80 mg via ORAL

## 2016-06-23 MED ORDER — ONDANSETRON HCL 4 MG/2ML IJ SOLN
INTRAMUSCULAR | Status: AC
  Filled 2016-06-23: qty 2

## 2016-06-23 MED ORDER — DEXAMETHASONE SODIUM PHOSPHATE 4 MG/ML IJ SOLN
INTRAMUSCULAR | Status: AC
  Filled 2016-06-23: qty 1

## 2016-06-23 MED ORDER — IBUPROFEN 600 MG PO TABS
600.0000 mg | ORAL_TABLET | Freq: Four times a day (QID) | ORAL | Status: DC | PRN
  Administered 2016-06-25: 600 mg via ORAL

## 2016-06-23 MED ORDER — BUPIVACAINE IN DEXTROSE 0.75-8.25 % IT SOLN
INTRATHECAL | Status: DC | PRN
  Administered 2016-06-23: 1.4 mL via INTRATHECAL

## 2016-06-23 MED ORDER — ONDANSETRON HCL 4 MG/2ML IJ SOLN: 4.0000 mg | Freq: Three times a day (TID) | INTRAMUSCULAR | Status: DC | PRN

## 2016-06-23 MED ORDER — OXYTOCIN 40 UNITS IN LACTATED RINGERS INFUSION - SIMPLE MED: 2.5000 [IU]/h | INTRAVENOUS | Status: AC

## 2016-06-23 MED ORDER — SIMETHICONE 80 MG PO CHEW
80.0000 mg | CHEWABLE_TABLET | ORAL | Status: DC
  Administered 2016-06-24: 80 mg via ORAL
  Filled 2016-06-23 (×2): qty 1

## 2016-06-23 MED ORDER — OXYCODONE-ACETAMINOPHEN 5-325 MG PO TABS: 1.0000 | ORAL_TABLET | ORAL | Status: DC | PRN

## 2016-06-23 MED ORDER — PHENYLEPHRINE 40 MCG/ML (10ML) SYRINGE FOR IV PUSH (FOR BLOOD PRESSURE SUPPORT)
PREFILLED_SYRINGE | INTRAVENOUS | Status: AC
  Filled 2016-06-23: qty 90

## 2016-06-23 MED ORDER — KETOROLAC TROMETHAMINE 10 MG PO TABS
10.0000 mg | ORAL_TABLET | Freq: Three times a day (TID) | ORAL | Status: DC
  Administered 2016-06-23 – 2016-06-24 (×4): 10 mg via ORAL
  Filled 2016-06-23 (×10): qty 1

## 2016-06-23 MED ORDER — NALOXONE HCL 2 MG/2ML IJ SOSY
1.0000 ug/kg/h | PREFILLED_SYRINGE | INTRAMUSCULAR | Status: DC | PRN
  Filled 2016-06-23: qty 2

## 2016-06-23 MED ORDER — SENNOSIDES-DOCUSATE SODIUM 8.6-50 MG PO TABS
2.0000 | ORAL_TABLET | ORAL | Status: DC
  Administered 2016-06-24 (×2): 2 via ORAL
  Filled 2016-06-23 (×5): qty 2

## 2016-06-23 MED ORDER — LACTATED RINGERS IV SOLN
INTRAVENOUS | Status: DC
  Administered 2016-06-23: 21:00:00 via INTRAVENOUS

## 2016-06-23 MED ORDER — LACTATED RINGERS IV SOLN
Freq: Once | INTRAVENOUS | Status: AC
  Administered 2016-06-23: 11:00:00 via INTRAVENOUS

## 2016-06-23 MED ORDER — BUPIVACAINE HCL (PF) 0.5 % IJ SOLN
INTRAMUSCULAR | Status: DC | PRN
  Administered 2016-06-23: 6 mL
  Administered 2016-06-23: 24 mL

## 2016-06-23 MED ORDER — PRENATAL MULTIVITAMIN CH
1.0000 | ORAL_TABLET | Freq: Every day | ORAL | Status: DC
  Administered 2016-06-24: 1 via ORAL
  Filled 2016-06-23 (×3): qty 1

## 2016-06-23 MED ORDER — PHENYLEPHRINE HCL 10 MG/ML IJ SOLN
INTRAMUSCULAR | Status: DC | PRN
  Administered 2016-06-23 (×3): 120 ug via INTRAVENOUS
  Administered 2016-06-23 (×2): 80 ug via INTRAVENOUS

## 2016-06-23 MED ORDER — CEFAZOLIN SODIUM-DEXTROSE 2-4 GM/100ML-% IV SOLN
INTRAVENOUS | Status: AC
  Filled 2016-06-23: qty 100

## 2016-06-23 MED ORDER — DIPHENHYDRAMINE HCL 25 MG PO CAPS
25.0000 mg | ORAL_CAPSULE | Freq: Four times a day (QID) | ORAL | Status: DC | PRN
  Filled 2016-06-23: qty 1

## 2016-06-23 MED ORDER — KETOROLAC TROMETHAMINE 30 MG/ML IJ SOLN
INTRAMUSCULAR | Status: AC
  Administered 2016-06-23: 30 mg
  Filled 2016-06-23: qty 1

## 2016-06-23 MED ORDER — MEPERIDINE HCL 25 MG/ML IJ SOLN: 6.2500 mg | INTRAMUSCULAR | Status: DC | PRN

## 2016-06-23 MED ORDER — LACTATED RINGERS IV SOLN
INTRAVENOUS | Status: DC
  Administered 2016-06-23 (×3): via INTRAVENOUS

## 2016-06-23 MED ORDER — BUPIVACAINE 0.5 % ON-Q PUMP SINGLE CATH 300 ML
330.0000 mL | INJECTION | Status: DC
  Filled 2016-06-23: qty 600

## 2016-06-23 MED ORDER — OXYCODONE-ACETAMINOPHEN 5-325 MG PO TABS: 2.0000 | ORAL_TABLET | ORAL | Status: DC | PRN

## 2016-06-23 MED ORDER — DIBUCAINE 1 % RE OINT: 1.0000 "application " | TOPICAL_OINTMENT | RECTAL | Status: DC | PRN

## 2016-06-23 MED ORDER — PROMETHAZINE HCL 25 MG/ML IJ SOLN: 6.2500 mg | INTRAMUSCULAR | Status: DC | PRN

## 2016-06-23 SURGICAL SUPPLY — 37 items
BARRIER ADHS 3X4 INTERCEED (GAUZE/BANDAGES/DRESSINGS) IMPLANT
BENZOIN TINCTURE PRP APPL 2/3 (GAUZE/BANDAGES/DRESSINGS) ×2 IMPLANT
CLAMP CORD UMBIL (MISCELLANEOUS) IMPLANT
CLOSURE STERI STRIP 1/2 X4 (GAUZE/BANDAGES/DRESSINGS) ×2 IMPLANT
CLOTH BEACON ORANGE TIMEOUT ST (SAFETY) ×2 IMPLANT
CONTAINER PREFILL 10% NBF 15ML (MISCELLANEOUS) IMPLANT
DRSG OPSITE POSTOP 4X10 (GAUZE/BANDAGES/DRESSINGS) ×2 IMPLANT
DURAPREP 26ML APPLICATOR (WOUND CARE) ×2 IMPLANT
ELECT REM PT RETURN 9FT ADLT (ELECTROSURGICAL) ×2
ELECTRODE REM PT RTRN 9FT ADLT (ELECTROSURGICAL) ×1 IMPLANT
EXTRACTOR VACUUM KIWI (MISCELLANEOUS) IMPLANT
GLOVE BIO SURGEON STRL SZ 6.5 (GLOVE) ×2 IMPLANT
GLOVE BIOGEL PI IND STRL 7.0 (GLOVE) ×1 IMPLANT
GLOVE BIOGEL PI INDICATOR 7.0 (GLOVE) ×1
GOWN STRL REUS W/TWL LRG LVL3 (GOWN DISPOSABLE) ×4 IMPLANT
KIT ABG SYR 3ML LUER SLIP (SYRINGE) IMPLANT
NEEDLE HYPO 25X5/8 SAFETYGLIDE (NEEDLE) IMPLANT
NEEDLE SPNL 18GX3.5 QUINCKE PK (NEEDLE) ×2 IMPLANT
NS IRRIG 1000ML POUR BTL (IV SOLUTION) ×2 IMPLANT
PACK C SECTION WH (CUSTOM PROCEDURE TRAY) ×2 IMPLANT
PAD OB MATERNITY 4.3X12.25 (PERSONAL CARE ITEMS) ×2 IMPLANT
PENCIL SMOKE EVAC W/HOLSTER (ELECTROSURGICAL) ×2 IMPLANT
SUT PDS AB 0 CTX 60 (SUTURE) IMPLANT
SUT VIC AB 0 CT1 27 (SUTURE)
SUT VIC AB 0 CT1 27XBRD ANBCTR (SUTURE) IMPLANT
SUT VIC AB 0 CT1 36 (SUTURE) IMPLANT
SUT VIC AB 2-0 CT1 27 (SUTURE) ×1
SUT VIC AB 2-0 CT1 TAPERPNT 27 (SUTURE) ×1 IMPLANT
SUT VIC AB 2-0 CTX 36 (SUTURE) ×4 IMPLANT
SUT VIC AB 3-0 CT1 27 (SUTURE) ×1
SUT VIC AB 3-0 CT1 TAPERPNT 27 (SUTURE) ×1 IMPLANT
SUT VIC AB 3-0 SH 27 (SUTURE)
SUT VIC AB 3-0 SH 27X BRD (SUTURE) IMPLANT
SUT VIC AB 4-0 KS 27 (SUTURE) ×2 IMPLANT
SYR 30ML LL (SYRINGE) ×2 IMPLANT
TOWEL OR 17X24 6PK STRL BLUE (TOWEL DISPOSABLE) ×2 IMPLANT
TRAY FOLEY CATH SILVER 14FR (SET/KITS/TRAYS/PACK) ×2 IMPLANT

## 2016-06-23 NOTE — Anesthesia Postprocedure Evaluation (Signed)
Anesthesia Post Note  Patient: Darlene Grant  Procedure(s) Performed: Procedure(s) (LRB): CESAREAN SECTION (N/A)  Patient location during evaluation: PACU Anesthesia Type: Spinal Level of consciousness: awake Pain management: pain level controlled Vital Signs Assessment: post-procedure vital signs reviewed and stable Respiratory status: spontaneous breathing Cardiovascular status: stable Postop Assessment: no headache, no backache, spinal receding, patient able to bend at knees and no signs of nausea or vomiting Anesthetic complications: no        Last Vitals:  Vitals:   06/23/16 1403 06/23/16 1500  BP:    Pulse: 68   Resp: 15 13  Temp: 36.6 C 37.1 C    Last Pain:  Vitals:   06/23/16 1500  TempSrc: Oral  PainSc: 2    Pain Goal: Patients Stated Pain Goal: 4 (06/23/16 1044)               [place]

## 2016-06-23 NOTE — Lactation Note (Signed)
This note was copied from a baby's chart. Lactation Consultation Note  Patient Name: Darlene Grant Today's Date: 06/23/2016 Reason for consult: Initial assessment Baby at 3 hr of life. Baby was spitty and sleeping sts with mom. Mom bf for 64 m with her older child. She had sore nipples for the entire bf relationship. She was Tx for thrush without improvement. She used a NS because she was told she has flat nipples. She is worried about pain, supply, and latch. She asked if she could start pumping if baby does not wake to feed in the next hour. Set up DEBP. Discussed baby behavior, feeding frequency, baby belly size, voids, wt loss, breast changes, and nipple care. Demonstrated manual expression, colostrum noted bilaterally, given spoons. Given lactation handouts. Aware of OP services and support group.     Maternal Data Has patient been taught Hand Expression?: Yes Does the patient have breastfeeding experience prior to this delivery?: Yes  Feeding Feeding Type: Breast Fed Length of feed: 5 min  LATCH Score/Interventions Latch: Repeated attempts needed to sustain latch, nipple held in mouth throughout feeding, stimulation needed to elicit sucking reflex.  Audible Swallowing: None  Type of Nipple: Everted at rest and after stimulation  Comfort (Breast/Nipple): Soft / non-tender     Hold (Positioning): Assistance needed to correctly position infant at breast and maintain latch.  LATCH Score: 6  Lactation Tools Discussed/Used WIC Program: No Pump Review: Setup, frequency, and cleaning;Milk Storage;Other (comment) (pump settings) Initiated by:: ES Date initiated:: 06/23/16   Consult Status Consult Status: Follow-up Date: 06/24/16 Follow-up type: In-patient    [name] 06/23/2016, 4:16 PM

## 2016-06-23 NOTE — H&P (Signed)
Obstetric Preoperative History and Physical  Darlene Grant is a 41 y.o. G2P1001 with IUP at [redacted]w[redacted]d presenting for scheduled cesarean section.  Patient is concerned about receiving narcotics. For 24 hours following her last delivery she was incoherent and making little since. Will avoid narcotics today.   Prenatal Course Source of Care: [facility]  with onset of care at 8 wks 5 days Pregnancy complications or risks: Patient Active Problem List   Diagnosis Date Noted  . Post-operative complication [id number]  . History of macrosomia in infant in prior pregnancy, currently pregnant 02/16/2016  . Low lying posterior placenta, antepartum 02/01/2016  . Supervision of normal pregnancy, antepartum 11/22/2015  . AMA (advanced maternal age) multigravida 35+ 11/22/2015  . 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 Calcification of right breast 02/19/2015  . Pregnancy with history of cesarean section, antepartum 11/21/2013  . Ligament tear 07/05/2012   She plans to breastfeed She desires vasectomy for postpartum contraception.   Prenatal labs and studies: ABO, Rh: --/--/A POS (01/05 1030) Antibody: NEG (01/05 1030) Rubella: 2.22 (06/05 1120) RPR: NON REAC (10/20 1048)  HBsAg: NEGATIVE (06/05 1120)  HIV: NONREACTIVE (10/20 1048)  GBS:  negative 1 hr Glucola  109 Genetic screening normal Anatomy US normal  Prenatal Transfer Tool  Maternal Diabetes: No Genetic Screening: Normal Maternal Ultrasounds/Referrals: Normal Fetal Ultrasounds or other Referrals:  None Maternal Substance Abuse:  No Significant Maternal Medications:  None Significant Maternal Lab Results: Lab values include: Group B Strep negative  Past Medical History:  Diagnosis Date  . Abnormal Pap smear    ASCUS 03/13/11  . Anemia   . Breast pain, left   . Candida vaginitis   . Rectal itching   . Vaginal Pap smear, abnormal     Past Surgical History:  Procedure Laterality Date  . BREAST BIOPSY Right 8/16  .  CESAREAN SECTION N/A 11/21/2013   Procedure: CESAREAN SECTION;  Surgeon: Alwyn Pea, MD;  Location: [facility];  Service: Obstetrics;  Laterality: N/A;  . HAND SURGERY  04/2012   ligament repair  . PILONIDAL CYST EXCISION  2000  . WISDOM TOOTH EXTRACTION  AGE 46    OB History  Gravida Para Term Preterm AB Living  2 1 1     1   SAB TAB Ectopic Multiple Live Births          1    # Outcome Date GA Lbr Len/2nd Weight Sex Delivery Anes PTL Lv  2 Current           1 Term 11/21/13 [redacted]w[redacted]d  9 lb 4.8 oz (4.218 kg) M CS-LTranv EPI  LIV      Social History   Social History  . Marital status: Married    Spouse name: N/A  . Number of children: N/A  . Years of education: N/A   Occupational History  . occupational therapist    Social History Main Topics  . Smoking status: Never Smoker  . Smokeless tobacco: Never Used  . Alcohol use 0.0 oz/week     Comment: occassionally  . Drug use: No  . Sexual activity: Yes    Partners: Male    Birth control/ protection: Condom   Other Topics Concern  . None   Social History Narrative  . None    Family History  Problem Relation Age of Onset  . Cancer Mother 39    breast, premenopausal  . Cancer Maternal Grandmother     skin, stomach  . Cancer Maternal Grandfather  lung    Prescriptions Prior to Admission  Medication Sig Dispense Refill Last Dose  . AMBULATORY NON FORMULARY MEDICATION Apply 1 Dose topically as needed (Apply to nipple after feeding episodes). (Patient not taking: Reported on 06/23/2016) 30 g 5 Not Taking at Unknown time  . clobetasol cream (TEMOVATE) [id number] % Apply 1 application topically 2 (two) times daily. (Patient not taking: Reported on 06/23/2016) 60 g 1 Not Taking at Unknown time  . cyclobenzaprine (FLEXERIL) 10 MG tablet Take one tablet as needed up to twice a day for neck spasms. (Patient not taking: Reported on 06/23/2016) 30 tablet 1 Not Taking at Unknown time  . famotidine (PEPCID) 10 MG tablet Take 10 mg by mouth 2  (two) times daily.   06/22/2016  . fluticasone (FLONASE) 50 MCG/ACT nasal spray Place 2 sprays into the nose daily. (Patient not taking: Reported on 06/23/2016) 16 g 5 Not Taking at Unknown time  . hydrocortisone valerate ointment (WEST-CORT) 0.2 % Apply 1 application topically 2 (two) times daily. (Patient not taking: Reported on 06/23/2016) 45 g 0 Not Taking at Unknown time  . Prenatal Vit-Fe Fumarate-FA (PRENATAL VITAMIN PO) Take by mouth.   06/22/2016  . Probiotic Product (PROBIOTIC DAILY PO) Take 1 tablet by mouth.   06/22/2016    Allergies  Allergen Reactions  . Augmentin [Amoxicillin-Pot Clavulanate]     diarrhea  . Hydrocodone Itching  . Keflex [Cephalexin] Diarrhea    Review of Systems: Negative except for what is mentioned in HPI.  Physical Exam: BP 132/82   Pulse (!) 102   Temp 98.9 F (37.2 C)   Resp 18   Ht 5\' 4"  (1.626 m)   Wt 197 lb (89.4 kg)   LMP 09/22/2015 (Exact Date)   SpO2 100%   BMI 33.81 kg/m  FHR by Doppler: 130 bpm CONSTITUTIONAL: Well-developed, well-nourished female in no acute distress.  HENT:  Normocephalic, atraumatic, External right and left ear normal. Oropharynx is clear and moist EYES: Conjunctivae and EOM are normal. Pupils are equal, round, and reactive to light. No scleral icterus.  NECK: Normal range of motion, supple, no masses SKIN: Skin is warm and dry. No rash noted. Not diaphoretic. No erythema. No pallor. [place]: Alert and oriented to person, place, and time. Normal reflexes, muscle tone coordination. No cranial nerve deficit noted. PSYCHIATRIC: Normal mood and affect. Normal behavior. Normal judgment and thought content. CARDIOVASCULAR: Normal heart rate noted, regular rhythm RESPIRATORY: Effort and breath sounds normal, no problems with respiration noted ABDOMEN: Soft, nontender, nondistended, gravid. Well-healed Pfannenstiel incision. PELVIC: Deferred MUSCULOSKELETAL: Normal range of motion. No edema and no tenderness. 2+ distal  pulses.   Pertinent Labs/Studies:   Results for orders placed or performed during the hospital encounter of 06/23/16 (from the past 72 hour(s))  CBC     Status: Abnormal   Collection Time: 06/23/16 10:30 AM  Result Value Ref Range   WBC 9.3 4.0 - 10.5 K/uL   RBC 3.66 (L) 3.87 - 5.11 MIL/uL   Hemoglobin 11.7 (L) 12.0 - 15.0 g/dL   HCT 34.6 (L) 36.0 - 46.0 %   MCV 94.5 78.0 - 100.0 fL   MCH 32.0 26.0 - 34.0 pg   MCHC 33.8 30.0 - 36.0 g/dL   RDW 14.4 11.5 - 15.5 %   Platelets 175 150 - 400 K/uL  Type and screen     Status: None   Collection Time: 06/23/16 10:30 AM  Result Value Ref Range   ABO/RH(D) A POS  Antibody Screen NEG    Sample Expiration 06/26/2016     Assessment and Plan :Alija Cantrelle is a 41 y.o. G2P1001 at [redacted]w[redacted]d being admitted for scheduled cesarean section. The risks of cesarean section discussed with the patient included but were not limited to: bleeding which may require transfusion or reoperation; infection which may require antibiotics; injury to bowel, bladder, ureters or other surrounding organs; injury to the fetus; need for additional procedures including hysterectomy in the event of a life-threatening hemorrhage; placental abnormalities wth subsequent pregnancies, incisional problems, thromboembolic phenomenon and other postoperative/anesthesia complications. The patient concurred with the proposed plan, giving informed written consent for the procedure. Patient has been NPO since last night she will remain NPO for procedure. Anesthesia and OR aware. Preoperative prophylactic antibiotics and SCDs ordered on call to the OR. To OR when ready.    Jacquiline Doe, MD OB fellow Faculty Practice, [facility]

## 2016-06-23 NOTE — Anesthesia Procedure Notes (Signed)
Spinal  Patient location during procedure: OR Start time: 06/23/2016 12:33 PM End time: 06/23/2016 12:37 PM Staffing Anesthesiologist: Lyn Hollingshead Performed: anesthesiologist  Preanesthetic Checklist Completed: patient identified, surgical consent, pre-op evaluation, timeout performed, IV checked, risks and benefits discussed and monitors and equipment checked Spinal Block Patient position: sitting Prep: site prepped and draped and DuraPrep Patient monitoring: heart rate, cardiac monitor, continuous pulse ox and blood pressure Approach: midline Location: L3-4 Injection technique: single-shot Needle Needle type: Sprotte  Needle gauge: 24 G Needle length: 9 cm Needle insertion depth: 8 cm Assessment Sensory level: T4

## 2016-06-23 NOTE — Anesthesia Preprocedure Evaluation (Signed)
Anesthesia Evaluation  Patient identified by MRN, date of birth, ID band Patient awake    Reviewed: Allergy & Precautions, H&P , NPO status , Patient's Chart, lab work & pertinent test results  Airway Mallampati: I  TM Distance: >3 FB Neck ROM: full    Dental no notable dental hx.    Pulmonary neg pulmonary ROS,    Pulmonary exam normal        Cardiovascular negative cardio ROS Normal cardiovascular exam     Neuro/Psych negative neurological ROS  negative psych ROS   GI/Hepatic negative GI ROS, Neg liver ROS,   Endo/Other  negative endocrine ROS  Renal/GU negative Renal ROS     Musculoskeletal   Abdominal (+) + obese,   Peds  Hematology   Anesthesia Other Findings   Reproductive/Obstetrics (+) Pregnancy                             Anesthesia Physical Anesthesia Plan  ASA: II  Anesthesia Plan: Spinal   Post-op Pain Management:    Induction:   Airway Management Planned:   Additional Equipment:   Intra-op Plan:   Post-operative Plan:   Informed Consent: I have reviewed the patients History and Physical, chart, labs and discussed the procedure including the risks, benefits and alternatives for the proposed anesthesia with the patient or authorized representative who has indicated his/her understanding and acceptance.     Plan Discussed with: CRNA and Surgeon  Anesthesia Plan Comments:         Anesthesia Quick Evaluation

## 2016-06-23 NOTE — Lactation Note (Signed)
This note was copied from a baby's chart. Lactation Consultation Note  Patient Name: Darlene Grant [id number] Date: 06/23/2016 Reason for consult: Follow-up assessment Baby at 6 hr of life. Upon entry baby was sts with Dad. Mom stated that baby is acting hungry but will not stay latched. She feels like baby is biting the tip of the nipple. Mom has a bruise on the surface of R nipple. Baby will open mouth, but not very wide, then close down like she is trying to suck. Applied #20 NS and baby was able to maintain 5 minutes of good sucking before falling asleep. Mom has easily expressed colostrum bilaterally. She is aware of spoon feeding. After the bf baby was a little spitty. Encouraged mom to continue trying to latch baby and post pump if baby does not latch well.   Maternal Data Has patient been taught Hand Expression?: Yes Does the patient have breastfeeding experience prior to this delivery?: Yes  Feeding Feeding Type: Breast Fed Length of feed: 8 min  LATCH Score/Interventions Latch: Repeated attempts needed to sustain latch, nipple held in mouth throughout feeding, stimulation needed to elicit sucking reflex. Intervention(s): Adjust position;Assist with latch;Breast massage  Audible Swallowing: None Intervention(s): Hand expression;Skin to skin  Type of Nipple: Everted at rest and after stimulation  Comfort (Breast/Nipple): Filling, red/small blisters or bruises, mild/mod discomfort  Problem noted: Cracked, bleeding, blisters, bruises Interventions  (Cracked/bleeding/bruising/blister): Expressed breast milk to nipple  Hold (Positioning): Assistance needed to correctly position infant at breast and maintain latch. Intervention(s): Support Pillows;Position options  LATCH Score: 5  Lactation Tools Discussed/Used Tools: Nipple Shields Nipple shield size: 20 WIC Program: No Pump Review: Setup, frequency, and cleaning;Milk Storage;Other (comment) (pump  settings) Initiated by:: ES Date initiated:: 06/23/16   Consult Status Consult Status: Follow-up Date: 06/24/16 Follow-up type: In-patient    [name] 06/23/2016, 7:40 PM

## 2016-06-23 NOTE — Op Note (Signed)
06/23/2016  1:19 PM  PATIENT:  Darlene Grant  41 y.o. female  PRE-OPERATIVE DIAGNOSIS:  cpt [phone] - REPEAT c-section  POST-OPERATIVE DIAGNOSIS:  * No post-op diagnosis entered *  PROCEDURE:  Procedure(s): CESAREAN SECTION (N/A)  SURGEON:  Surgeon(s) and Role:    * Emily Filbert, MD - Primary    * Waldemar Dickens, MD - Assisting   ANESTHESIA:   spinal  EBL:  Total I/O In: 4000 [I.V.:4000] Out: 1100 [Urine:350; Blood:750]  BLOOD ADMINISTERED:none  DRAINS: none   LOCAL MEDICATIONS USED:  MARCAINE     SPECIMEN:  Source of Specimen:  cord blood  DISPOSITION OF SPECIMEN:  PATHOLOGY  COUNTS:  YES  TOURNIQUET:  * No tourniquets in log *  DICTATION: .Dragon Dictation  PLAN OF CARE: Admit to inpatient   PATIENT DISPOSITION:  PACU - hemodynamically stable.   Delay start of Pharmacological VTE agent (>24hrs) due to surgical blood loss or risk of bleeding: not applicable  The risks, benefits, and alternatives of surgery were explained, understood, accepted. Consents were signed. All questions were answered. In the operating room spinal anesthesia was applied without complication. Her abdomen and vagina were prepped and draped in the usual sterile fashion. A Foley catheter was placed, draining clear urine throughout case. Timeout procedure was done. After adequate anesthesia was assured 30 mL for 0.5% Marcaine was injected into the subcutaneous tissue at the site of her previous cesarean. An incision was made through the previous incision. The incision was carried down through the subcutaneous tissue to the fascia. The fascia was scored the midline and extended bilaterally. The middle 25% of the rectus muscles were separated in a transverse fashion using electrosurgical technique. Excellent hemostasis was maintained. The peritoneum was entered with hemostats. Peritoneal incision was extended bilaterally with the Bovie. The bladder blade was placed. A transverse  incision was made on the well-developed lower uterine segment. The uterine incision was extended with traction on each side. Amniotomy was performed with a hemostat. Clear fluid was noted. The baby was delivered from a vertex presentation.the mouth and nostrils were suctioned prior to delivery of the shoulders.  The baby's cord was clamped and cut after 1 minuteand was transferred to the NICU personnel for routine care. The placenta was delivered intact with traction. The uterus was removed and the interior was cleaned with a dry lap sponge. The uterine incision was closed in 2 layers with the second layer imbricating the first. with 2-0 Vicryl running locking suture. Excellent hemostasis was noted after placing a figure of eight suture at each corner. I replaced the uterus back inside the pelvis and noted hemostasis again. The adnexa appeared normal.  The rectus fascia rectus muscles were noted be hemostatic as well. A Q pump was placed for post op pain management. The fascia was closed with a #1 PDS loop in a running nonlocking fashion. No defects were palpable. The subcutaneous tissue was irrigated, clean, and dried. A subcuticular closure was done with a 3-0 Vicryl suture. Steri-Strips are placed. Excellent cosmetic results were obtained. She was taken to the recovery room in stable condition. She tolerated the procedure well.

## 2016-06-23 NOTE — Progress Notes (Signed)
Pt. Requests to have pitocin turned off and 02 monitor removed. Pt. Stable. Will continue to monitor. Provider notified with request for order to change dressing.

## 2016-06-23 NOTE — Transfer of Care (Signed)
Immediate Anesthesia Transfer of Care Note  Patient: Darlene Grant  Procedure(s) Performed: Procedure(s): CESAREAN SECTION (N/A)  Patient Location: PACU  Anesthesia Type:Spinal  Level of Consciousness: awake, alert  and oriented  Airway & Oxygen Therapy: Patient Spontanous Breathing  Post-op Assessment: Report given to RN and Post -op Vital signs reviewed and stable  Post vital signs: Reviewed and stable  Last Vitals:  Vitals:   06/23/16 1044  BP: 132/82  Pulse: (!) 102  Resp: 18  Temp: 37.2 C    Last Pain: There were no vitals filed for this visit.    Patients Stated Pain Goal: 4 ([id number])  Complications: No apparent anesthesia complications

## 2016-06-24 ENCOUNTER — Encounter (HOSPITAL_COMMUNITY): Payer: Self-pay | Admitting: Obstetrics & Gynecology

## 2016-06-24 LAB — CBC
HCT: 23.3 % — ABNORMAL LOW (ref 36.0–46.0)
HEMATOCRIT: 22.8 % — AB (ref 36.0–46.0)
Hemoglobin: 8.3 g/dL — ABNORMAL LOW (ref 12.0–15.0)
Hemoglobin: 8.2 g/dL — ABNORMAL LOW (ref 12.0–15.0)
MCH: 33.3 pg (ref 26.0–34.0)
MCH: 33.5 pg (ref 26.0–34.0)
MCHC: 35.5 g/dL (ref 30.0–36.0)
MCHC: 35.2 g/dL (ref 30.0–36.0)
MCV: 93.8 fL (ref 78.0–100.0)
MCV: 95.1 fL (ref 78.0–100.0)
PLATELETS: 173 10*3/uL (ref 150–400)
Platelets: 165 10*3/uL (ref 150–400)
RBC: 2.43 MIL/uL — ABNORMAL LOW (ref 3.87–5.11)
RBC: 2.45 MIL/uL — ABNORMAL LOW (ref 3.87–5.11)
RDW: 14.3 % (ref 11.5–15.5)
RDW: 14.5 % (ref 11.5–15.5)
WBC: 13.4 10*3/uL — ABNORMAL HIGH (ref 4.0–10.5)
WBC: 11.5 10*3/uL — AB (ref 4.0–10.5)

## 2016-06-24 LAB — RPR: RPR: NONREACTIVE

## 2016-06-24 LAB — BIRTH TISSUE RECOVERY COLLECTION (PLACENTA DONATION)

## 2016-06-24 NOTE — Anesthesia Postprocedure Evaluation (Addendum)
Anesthesia Post Note  Patient: Darlene Grant  Procedure(s) Performed: Procedure(s) (LRB): CESAREAN SECTION (N/A)  Patient location during evaluation: Mother Baby Anesthesia Type: Spinal Level of consciousness: awake, awake and alert, oriented and patient cooperative Pain management: pain level controlled Vital Signs Assessment: post-procedure vital signs reviewed and stable Respiratory status: spontaneous breathing, nonlabored ventilation and respiratory function stable Cardiovascular status: stable Postop Assessment: patient able to bend at knees, no headache, no backache and no signs of nausea or vomiting Anesthetic complications: no        Last Vitals:  Vitals:   06/23/16 2300 06/24/16 0342  BP: (!) 105/58 112/66  Pulse: 72 80  Resp:    Temp: 37 C 37.2 C    Last Pain:  Vitals:   06/24/16 0521  TempSrc:   PainSc: 0-No pain   Pain Goal: Patients Stated Pain Goal: 4 (06/23/16 1044)               CARVER,ALISON L

## 2016-06-24 NOTE — Progress Notes (Signed)
Patient ID: Darlene Grant, female   DOB: Apr 10, 1976, 41 y.o.   MRN: [id number] Subjective: Postpartum Day 1: Cesarean Delivery Patient reports tolerating PO, + flatus and no problems voiding.    Objective: Vital signs in last 24 hours: Temp:  [97.6 F (36.4 C)-98.9 F (37.2 C)] 98.9 F (37.2 C) (01/06 0342) Pulse Rate:  [61-106] 80 (01/06 0342) Resp:  [13-18] 15 (01/05 1659) BP: (102-132)/(58-82) 112/66 (01/06 0342) SpO2:  [95 %-100 %] 99 % (01/06 0342) Weight:  [89.4 kg (197 lb)] 89.4 kg (197 lb) (01/05 1044)  Physical Exam:  General: alert, cooperative, appears stated age and no distress Lochia: appropriate Uterine Fundus: firm Incision: healing well, slight sero-sanguinous discharge. DVT Evaluation: No evidence of DVT seen on physical exam.   Recent Labs  06/23/16 1030 06/24/16 0536  HGB 11.7* 8.3*  HCT 34.6* 22.8*    Assessment/Plan: Status post Cesarean section. Doing well postoperatively.  Continue current care. Follow up with Lactation (recent latch scores 6 & 5) Pain management with toradol, tylenol, and NSAIDS Hgb 11.7->8.3, follow up in AM.  Darlene Grant 06/24/2016, 7:25 AM

## 2016-06-24 NOTE — Addendum Note (Signed)
Addendum  created 06/24/16 [id number] by Raenette Rover, CRNA   Sign clinical note

## 2016-06-24 NOTE — Progress Notes (Signed)
Spoke with Maye Hides, CNM and informed her of pt having wet honeycomb after shower. Verbal order received to change honeycomb dressing. Dressing changed. Wille Celeste

## 2016-06-24 NOTE — Lactation Note (Signed)
This note was copied from a baby's chart. Lactation Consultation Note  Patient Name: Darlene Grant [id number] Date: 06/24/2016 Reason for consult: Follow-up assessment;Breast/nipple pain   Follow up with mom of 52 hour old infant. Infant with 12 BF for 8-30 minutes, 5 voids and 5 stools in last 24 hours. Weight 8 lb 8.2 oz with 4% weight loss since birth. LATCH Score 6-9 by bedside RN.   Mom reports she is using a NS with each feeding since yesterday although chart does not reflect that. Mom reports her nipples feel better today. Mom has a DEBP set up at bedside, she reports she is not pumping. Discussed reasoning for pumping and enc mom to pump 4-6 x a day with NS use, mom reports she feels infant is providing enough stimulation. Discussed with mom that is milk is obtained with pumping it should be fed to infant via spoon or syringe and finger feeding.  Mom was holding and feeding infant using the # 20 NS. Infant was on and off the breast and falling asleep at times, mom latched infant independently. Mom did not feel she needed assistance at this time.   Mom requested another NS For home, gave her another 105 and a 24 and gave directions for what symptoms to look for to increase size if needed. Mom reports difficulty with sore nipples and milk supply with her 2.39 yo.  She reports she had to pump for 2 weeks due to nipple soreness and then used a NS for 9 weeks and then he went to breast without NS, she did not indicate how long he BF.    Maternal Data Formula Feeding for Exclusion: No Has patient been taught Hand Expression?: Yes Does the patient have breastfeeding experience prior to this delivery?: Yes  Feeding    LATCH Score/Interventions                      Lactation Tools Discussed/Used Pump Review: Setup, frequency, and cleaning Initiated by:: Reviewed and enc mom to pump 4-6 x a day   Consult Status Consult Status: Follow-up Date: 06/25/16 Follow-up  type: In-patient    Debby Freiberg Amiyah Shryock 06/24/2016, 7:07 PM

## 2016-06-24 NOTE — Progress Notes (Signed)
Post Partum Day Day 1 Subjective: Postpartum Day Day  Cesarean Delivery Patient reports no problems voiding.    Objective: Vital signs in last 24 hours: Temp:  [97.6 F (36.4 C)-98.9 F (37.2 C)] 98.9 F (37.2 C) (01/06 0342) Pulse Rate:  [61-106] 80 (01/06 0342) Resp:  [13-18] 15 (01/05 1659) BP: (102-132)/(58-82) 112/66 (01/06 0342) SpO2:  [95 %-100 %] 99 % (01/06 0342) Weight:  [89.4 kg (197 lb)] 89.4 kg (197 lb) (01/05 1044)  Physical Exam:  General: alert, cooperative and no distress; lungs CTA, S1 and S2 auscultated  Lochia: appropriate Uterine Fundus: firm Incision: unable to assess due to honeycomb dressing; dressing is clean, dry and intact with On-Qpump in place.  DVT Evaluation: No evidence of DVT seen on physical exam.   Recent Labs  06/23/16 1030 06/24/16 0536  HGB 11.7* 8.3*  HCT 34.6* 22.8*    Assessment/Plan: Status post Cesarean section. Doing well postoperatively.  Continue current care. Anticipate Discharge on Day 2   [place] 06/24/2016, 10:33 AM

## 2016-06-25 MED ORDER — KETOROLAC TROMETHAMINE 10 MG PO TABS: 10.0000 mg | ORAL_TABLET | Freq: Three times a day (TID) | ORAL | 0 refills | Status: DC

## 2016-06-25 MED ORDER — ACETAMINOPHEN 325 MG PO TABS: 650.0000 mg | ORAL_TABLET | ORAL | Status: DC | PRN

## 2016-06-25 NOTE — Discharge Summary (Signed)
OB Discharge Summary     Patient Name: Darlene Grant DOB: 1975/12/11 MRN: [id number]  Date of admission: 06/23/2016 Delivering MD: Clovia Cuff C   Date of discharge: 06/25/2016  Admitting diagnosis: cpt [id number] - REPEAT c-section Intrauterine pregnancy: [redacted]w[redacted]d     Secondary diagnosis:  Active Problems:   Post-operative state  Additional problems: narcotic sensitivity     Discharge diagnosis: Term Pregnancy Delivered                                                                                                Post partum procedures:none  Augmentation: N/A  Complications: None  Hospital course:  Sceduled C/S   41 y.o. yo G2P2002 at [redacted]w[redacted]d was admitted to the hospital 06/23/2016 for scheduled cesarean section with the following indication:Elective Repeat.  Membrane Rupture Time/Date: 12:52 PM ,06/23/2016   Patient delivered a Viable infant.06/23/2016  Details of operation can be found in separate operative note.  Pateint had an uncomplicated postpartum course.  She is ambulating, tolerating a regular diet, passing flatus, and urinating well. Patient is discharged home in stable condition on  06/25/16. She will take Toradol for pain x 5 days and then transition to scheduled ibuprofen/Tylenol due to narcotic sensitivity.           Physical exam Vitals:   06/23/16 2300 06/24/16 0342 06/24/16 1831 06/25/16 0548  BP: (!) 105/58 112/66 114/64 111/64  Pulse: 72 80 84 91  Resp:   18 18  Temp: 98.6 F (37 C) 98.9 F (37.2 C) 98.2 F (36.8 C) 98.1 F (36.7 C)  TempSrc: Oral Oral Oral Oral  SpO2: 100% 99%    Weight:      Height:       General: alert and cooperative Lochia: appropriate Uterine Fundus: firm Incision: honeycomb intact, dry DVT Evaluation: No evidence of DVT seen on physical exam. Labs: Lab Results  Component Value Date   WBC 11.5 (H) 06/24/2016   HGB 8.2 (L) 06/24/2016   HCT 23.3 (L) 06/24/2016   MCV 95.1 06/24/2016   PLT 173 06/24/2016   CMP Latest Ref  Rng & Units 03/12/2015  Glucose 65 - 99 mg/dL 83  BUN 7 - 25 mg/dL 9  Creatinine 0.50 - 1.10 mg/dL 0.71  Sodium 135 - 146 mmol/L 140  Potassium 3.5 - 5.3 mmol/L 4.5  Chloride 98 - 110 mmol/L 105  CO2 20 - 31 mmol/L 27  Calcium 8.6 - 10.2 mg/dL 9.2  Total Protein 6.1 - 8.1 g/dL 6.4  Total Bilirubin 0.2 - 1.2 mg/dL 1.2  Alkaline Phos 33 - 115 U/L 42  AST 10 - 30 U/L 11  ALT 6 - 29 U/L 9    Discharge instruction: per After Visit Summary and "Baby and Me Booklet".  After visit meds:  Allergies as of 06/25/2016      Reactions   Augmentin [amoxicillin-pot Clavulanate]    diarrhea   Hydrocodone Itching   Keflex [cephalexin] Diarrhea      Medication List    STOP taking these medications   AMBULATORY NON FORMULARY MEDICATION   clobetasol cream 0.05 % Commonly  known as:  TEMOVATE   cyclobenzaprine 10 MG tablet Commonly known as:  FLEXERIL   famotidine 10 MG tablet Commonly known as:  PEPCID   fluticasone 50 MCG/ACT nasal spray Commonly known as:  FLONASE   hydrocortisone valerate ointment 0.2 % Commonly known as:  WEST-CORT     TAKE these medications   acetaminophen 325 MG tablet Commonly known as:  TYLENOL Take 2 tablets (650 mg total) by mouth every 4 (four) hours as needed (for pain scale < 4).   ketorolac 10 MG tablet Commonly known as:  TORADOL Take 1 tablet (10 mg total) by mouth every 8 (eight) hours.   PRENATAL VITAMIN PO Take by mouth.   PROBIOTIC DAILY PO Take 1 tablet by mouth.       Diet: routine diet  Activity: Advance as tolerated. Pelvic rest for 6 weeks.   Outpatient follow up:6 weeks Follow up Appt:No future appointments. Follow up Visit:No Follow-up on file.  Postpartum contraception: Vasectomy  Newborn Data: Live born female  Birth Weight: 8 lb 14.5 oz (4040 g) APGAR: 8, 9  Baby Feeding: Breast Disposition:home with mother   06/25/2016 Serita Grammes, CNM  7:53 AM

## 2016-06-25 NOTE — Lactation Note (Signed)
This note was copied from a baby's chart. Lactation Consultation Note  Patient Name: Darlene Grant [id number] Date: 06/25/2016 Reason for consult: Follow-up assessment;Other (Comment) (baby reweighed, inc weight, for D/c . per mom nipples alittle tender )  Increase in weight from early am after 12MN - 8-2 oz to 8-2.9 oz. Bili at 1224p = 10.3 .  Mom has been breast feeding and supplementing ( see doc flow sheets) .  As [place] walked in baby latched with #20 NS, and per mom comfortable, latched with depth. Mom reports baby has been feeding well with the NS  And sometimes without. From previous [place] mom declined post pumping.  Today this LC reviewed basics - sore nipple and engorgement prevention and tx. And highly recommended due to the use of the NS , and the weight loss,  And to enhance milk supply due to her hx of having MS issues with her 1st baby to consider adding post pumping after at least 4 feedings a day for 10 - 15 mins  When the baby isn't cluster feeding so, and supplement back her breast milk. LC also explained the EBM and enhancing volume will increasing stools therefore  Decrease jaundice quicker. Mom receptive to [facility] 's recommendation. Previous LC had sized and given mom #20 NS and #24 NS . Mom aware to increase size if the #20 NS becomes to snug. LC also discussed nutritive vs non - nutritive sucking patterns.  LC offered mom and LC O/P appt. Mom plans to check on Monday to see the [place] office has an [place] , if not she will call back to set up an [place] O/P appt. Phone number and instructions given to mom.   Maternal Data Has patient been taught Hand Expression?:  (per mom feels comfortable )  Feeding Feeding Type:  (baby latched ) Length of feed: 15 min (baby already latched with depth with NS , swallows noted )  LATCH Score/Interventions Latch:  (latched with depth with NS ) Intervention(s):  (LC enc breast compressions )  Audible Swallowing:  (swallows noted )            Hold (Positioning):  (mom independent with latch ) Intervention(s): Breastfeeding basics reviewed     Lactation Tools Discussed/Used Tools: Nipple Shields Nipple shield size: 20   Consult Status Consult Status: Complete (see LC note ) Date: 06/25/16    Jerlyn Ly Peace Jost 06/25/2016, 2:40 PM

## 2016-06-27 MED FILL — Bupivacaine HCl Preservative Free (PF) Inj 0.5%: INTRAMUSCULAR | Qty: 330 | Status: AC

## 2016-11-24 NOTE — Addendum Note (Signed)
Addendum  created 11/24/16 [id number] by Lyn Hollingshead, MD   Sign clinical note

## 2016-12-11 ENCOUNTER — Other Ambulatory Visit: Payer: Self-pay | Admitting: Dermatology

## 2016-12-11 DIAGNOSIS — L603 Nail dystrophy: Secondary | ICD-10-CM | POA: Diagnosis not present

## 2016-12-11 DIAGNOSIS — L811 Chloasma: Secondary | ICD-10-CM | POA: Diagnosis not present

## 2016-12-11 DIAGNOSIS — D485 Neoplasm of uncertain behavior of skin: Secondary | ICD-10-CM | POA: Diagnosis not present

## 2016-12-18 ENCOUNTER — Ambulatory Visit (INDEPENDENT_AMBULATORY_CARE_PROVIDER_SITE_OTHER): Payer: BLUE CROSS/BLUE SHIELD | Admitting: Osteopathic Medicine

## 2016-12-18 ENCOUNTER — Encounter: Payer: Self-pay | Admitting: Osteopathic Medicine

## 2016-12-18 ENCOUNTER — Other Ambulatory Visit (HOSPITAL_COMMUNITY)
Admission: RE | Admit: 2016-12-18 | Discharge: 2016-12-18 | Disposition: A | Discharge status: Home / Self Care | Payer: BLUE CROSS/BLUE SHIELD | Source: Ambulatory Visit | Attending: Osteopathic Medicine | Admitting: Osteopathic Medicine

## 2016-12-18 VITALS — BP 119/70 | HR 76 | Ht 64.25 in | Wt 153.0 lb

## 2016-12-18 DIAGNOSIS — N898 Other specified noninflammatory disorders of vagina: Secondary | ICD-10-CM | POA: Diagnosis not present

## 2016-12-18 DIAGNOSIS — Z0000 Encounter for general adult medical examination without abnormal findings: Secondary | ICD-10-CM | POA: Diagnosis not present

## 2016-12-18 LAB — CBC
HCT: 38.4 % (ref 35.0–45.0)
Hemoglobin: 12.7 g/dL (ref 11.7–15.5)
MCH: 30.2 pg (ref 27.0–33.0)
MCHC: 33.1 g/dL (ref 32.0–36.0)
MCV: 91.4 fL (ref 80.0–100.0)
MPV: 10.2 fL (ref 7.5–12.5)
PLATELETS: 242 10*3/uL (ref 140–400)
RBC: 4.2 MIL/uL (ref 3.80–5.10)
RDW: 13.8 % (ref 11.0–15.0)
WBC: 6.8 10*3/uL (ref 3.8–10.8)

## 2016-12-18 LAB — COMPLETE METABOLIC PANEL WITH GFR
ALT: 9 U/L (ref 6–29)
AST: 10 U/L (ref 10–30)
Albumin: 4.8 g/dL (ref 3.6–5.1)
Alkaline Phosphatase: 76 U/L (ref 33–115)
BUN: 17 mg/dL (ref 7–25)
CALCIUM: 9.8 mg/dL (ref 8.6–10.2)
CHLORIDE: 104 mmol/L (ref 98–110)
CO2: 27 mmol/L (ref 20–31)
CREATININE: 0.75 mg/dL (ref 0.50–1.10)
GFR, Est African American: >89 mL/min (ref >=60)
GFR, Est Non African American: >89 mL/min (ref >=60)
GLUCOSE: 101 mg/dL — AB (ref 65–99)
POTASSIUM: 4.2 mmol/L (ref 3.5–5.3)
Sodium: 140 mmol/L (ref 135–146)
TOTAL PROTEIN: 7.2 g/dL (ref 6.1–8.1)
Total Bilirubin: 0.7 mg/dL (ref 0.2–1.2)

## 2016-12-18 LAB — LIPID PANEL
CHOL/HDL RATIO: 2.4 ratio (ref <5.0)
Cholesterol: 168 mg/dL (ref <200)
HDL: 70 mg/dL (ref >50)
LDL Cholesterol: 80 mg/dL (ref <100)
Triglycerides: 89 mg/dL (ref <150)
VLDL: 18 mg/dL (ref <30)

## 2016-12-18 MED ORDER — METRONIDAZOLE 0.75 % VA GEL: 1.0000 | Freq: Every day | VAGINAL | 0 refills | Status: DC

## 2016-12-18 MED ORDER — FLUCONAZOLE 150 MG PO TABS: 150.0000 mg | ORAL_TABLET | Freq: Once | ORAL | 1 refills | Status: AC

## 2016-12-18 MED ORDER — CYCLOBENZAPRINE HCL 10 MG PO TABS: 5.0000 mg | ORAL_TABLET | Freq: Three times a day (TID) | ORAL | 1 refills | Status: DC | PRN

## 2016-12-18 NOTE — Progress Notes (Signed)
HPI: Darlene Grant is a 41 y.o. female  who presents to [place] [facility] today, 12/18/16,  for chief complaint of:  Chief Complaint  Patient presents with  . Annual Exam      Patient here for annual physical / wellness exam.  See preventive care reviewed as below.  Recent labs reviewed in detail with the patient.   Additional concerns today include:  Vaginal discharge, mild, minimally bothersome, present since postpartum  Hasn't had postpartum checkup - no GHTN, no GDM    Past medical, surgical, social and family history reviewed: Patient Active Problem List   Diagnosis Date Noted  . Post-operative state 06/23/2016  . Post-operative complication 37/03/6268  . History of macrosomia in infant in prior pregnancy, currently pregnant 02/16/2016  . Low lying posterior placenta, antepartum 02/01/2016  . Supervision of normal pregnancy, antepartum 11/22/2015  . AMA (advanced maternal age) multigravida 35+ 11/22/2015  . 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 Calcification of right breast 02/19/2015  . Pregnancy with history of cesarean section, antepartum 11/21/2013  . Ligament tear 07/05/2012   Past Surgical History:  Procedure Laterality Date  . BREAST BIOPSY Right 8/16  . CESAREAN SECTION N/A 11/21/2013   Procedure: CESAREAN SECTION;  Surgeon: Alwyn Pea, MD;  Location: [facility];  Service: Obstetrics;  Laterality: N/A;  . CESAREAN SECTION N/A 06/23/2016   Procedure: CESAREAN SECTION;  Surgeon: Emily Filbert, MD;  Location: [facility];  Service: Obstetrics;  Laterality: N/A;  . HAND SURGERY  04/2012   ligament repair  . PILONIDAL CYST EXCISION  2000  . WISDOM TOOTH EXTRACTION  AGE 55   Social History  Substance Use Topics  . Smoking status: Never Smoker  . Smokeless tobacco: Never Used  . Alcohol use 0.0 oz/week     Comment: occassionally   Family History  Problem Relation Age of Onset  . Cancer Mother 59        breast, premenopausal  . Cancer Maternal Grandmother        skin, stomach  . Cancer Maternal Grandfather        lung     Current medication list and allergy/intolerance information reviewed:   Current Outpatient Prescriptions  Medication Sig Dispense Refill  . acetaminophen (TYLENOL) 325 MG tablet Take 2 tablets (650 mg total) by mouth every 4 (four) hours as needed (for pain scale < 4).    . ketorolac (TORADOL) 10 MG tablet Take 1 tablet (10 mg total) by mouth every 8 (eight) hours. 12 tablet 0  . Prenatal Vit-Fe Fumarate-FA (PRENATAL VITAMIN PO) Take by mouth.    . Probiotic Product (PROBIOTIC DAILY PO) Take 1 tablet by mouth.     No current facility-administered medications for this visit.    Allergies  Allergen Reactions  . Augmentin [Amoxicillin-Pot Clavulanate]     diarrhea  . Hydrocodone Itching  . Keflex [Cephalexin] Diarrhea      Review of Systems:  Constitutional:  No  fever, no chills, No recent illness, No unintentional weight changes. No significant fatigue.   HEENT: No  headache, no vision change, No sore throat, No  sinus pressure  Cardiac: No  chest pain, No  pressure, No palpitations, No  Orthopnea  Respiratory:  No  shortness of breath. No  Cough  Gastrointestinal: No  abdominal pain, No  nausea, No  vomiting,  No  blood in stool, No  diarrhea, No  constipation   Musculoskeletal: No new myalgia/arthralgia  Genitourinary: No  abnormal  genital bleeding, No abnormal genital discharge  Skin: No  Rash,   Hem/Onc: No  easy bruising/bleeding,   Neurologic: No  weakness, No  dizziness  Psychiatric: No  concerns with depression, No  concerns with anxiety, No sleep problems, No mood problems  Exam:  BP 119/70   Pulse 76   Ht 5' 4.25" (1.632 m)   Wt 153 lb (69.4 kg)   SpO2 100%   BMI 26.06 kg/m   Constitutional: VS see above. General Appearance: alert, well-developed, well-nourished, NAD  Eyes: Normal lids and conjunctive, non-icteric  sclera  Ears, Nose, Mouth, Throat: MMM, Normal external inspection ears/nares/mouth/lips/gums.   Neck: No masses, trachea midline. No thyroid enlargement. No tenderness/mass appreciated. No lymphadenopathy  Respiratory: Normal respiratory effort. no wheeze, no rhonchi, no rales  Cardiovascular: S1/S2 normal, no murmur, no rub/gallop auscultated. RRR. No lower extremity edema.   Gastrointestinal: Nontender, no masses. No hepatomegaly, no splenomegaly. No hernia appreciated. Bowel sounds normal. Rectal exam deferred.   Musculoskeletal: Gait normal. No clubbing/cyanosis of digits.   Neurological: Normal balance/coordination. No tremor.   Skin: warm, dry, intact. No rash/ulcer.    Psychiatric: Normal judgment/insight. Normal mood and affect. Oriented x3.  GYN: No lesions/ulcers to external genitalia, normal urethra, normal vaginal mucosa, physiologic discharge, cervix normal without lesions, uterus not enlarged or tender, adnexa no masses and nontender  BREAST: No rashes/skin changes, normal fibrous breast tissue, no masses or tenderness, normal nipple without discharge, normal axilla   Health Maintenance Due  Topic Date Due  . PAP SMEAR - 02/24/15 NILM and HPV neg - discussed option to space out to q5 years 02/24/2016  . MAMMOGRAM - Hx calcification/fibroadenoma R breast  03/31/2016    ASSESSMENT/PLAN:   Annual physical exam - Plan: CBC, COMPLETE METABOLIC PANEL WITH GFR, Lipid panel, TSH, VITAMIN D 25 Hydroxy (Vit-D Deficiency, Fractures), Urinalysis, Routine w reflex microscopic, Cytology - PAP  Vaginal discharge - Plan: fluconazole (DIFLUCAN) 150 MG tablet, metroNIDAZOLE (METROGEL VAGINAL) 0.75 % vaginal gel   FEMALE PREVENTIVE CARE Updated 12/18/16   ANNUAL SCREENING/COUNSELING Diet/Exercise - HEALTHY HABITS DISCUSSED TO DECREASE CV RISK History  Smoking Status  . Never Smoker  Smokeless Tobacco  . Never Used   History  Alcohol Use  . 0.0 oz/week    Comment:  occassionally   No flowsheet data found.  Domestic violence concerns - no  HTN SCREENING - SEE [place]  Sexually active in the past year - Yes with female.  Need/want STI testing today? - no  Concerns about libido or pain with sex? - no  Plans for pregnancy? - done!   INFECTIOUS DISEASE SCREENING  HIV - does not need  GC/CT - does not need  HepC - DOB 1943-1965 - does not need  TB - does not need  DISEASE SCREENING  Lipid - needs  DM2 - needs  Osteoporosis - women age 52+ - does not need  CANCER SCREENING  Cervical - does not need  Breast - needs  Lung - does not need  Colon - does not need  ADULT VACCINATION  Influenza - annual vaccine recommended  Td - booster every 10 years   Zoster - option at 108, yes at 60+   PCV13 - was not indicated  PPSV23 - was not indicated Immunization History  Administered Date(s) Administered  . Hepatitis A 01/13/2010, 08/10/2010  . Influenza,inj,Quad PF,36+ Mos 02/24/2015, 06/25/2016  . PPD Test 12/15/2011, 12/09/2012, 05/17/2015  . Tdap 02/24/2015     Patient Instructions  Plan: Labs at your convenience - should hear back about results within one week Pap - should hear back about results in one week I've sent medications for BV and yeast just in case     Visit summary with medication list and pertinent instructions was printed for patient to review. All questions at time of visit were answered - patient instructed to contact office with any additional concerns. ER/RTC precautions were reviewed with the patient. Follow-up plan: No Follow-up on file.

## 2016-12-18 NOTE — Patient Instructions (Signed)
Plan: Labs at your convenience - should hear back about results within one week Pap - should hear back about results in one week I've sent medications for BV and yeast just in case

## 2016-12-19 ENCOUNTER — Encounter: Payer: Self-pay | Admitting: Osteopathic Medicine

## 2016-12-19 DIAGNOSIS — Z862 Personal history of diseases of the blood and blood-forming organs and certain disorders involving the immune mechanism: Secondary | ICD-10-CM

## 2016-12-19 LAB — URINALYSIS, ROUTINE W REFLEX MICROSCOPIC
BILIRUBIN URINE: NEGATIVE
GLUCOSE, UA: NEGATIVE
HGB URINE DIPSTICK: NEGATIVE
KETONES UR: NEGATIVE
Leukocytes, UA: NEGATIVE
Nitrite: NEGATIVE
PROTEIN: NEGATIVE
Specific Gravity, Urine: 1.01 (ref 1.001–1.035)
pH: 7 (ref 5.0–8.0)

## 2016-12-19 LAB — VITAMIN D 25 HYDROXY (VIT D DEFICIENCY, FRACTURES): Vit D, 25-Hydroxy: 35 ng/mL (ref 30–100)

## 2016-12-19 LAB — TSH: TSH: 0.49 m[IU]/L

## 2016-12-21 LAB — CYTOLOGY - PAP
Bacterial vaginitis: NEGATIVE
Candida vaginitis: NEGATIVE
Diagnosis: NEGATIVE
HPV: NOT DETECTED

## 2016-12-28 LAB — IRON AND TIBC
%SAT: 18 % (ref 11–50)
Iron: 59 ug/dL (ref 40–190)
TIBC: 329 ug/dL (ref 250–450)
UIBC: 270 ug/dL

## 2016-12-28 LAB — FERRITIN: FERRITIN: 24 ng/mL (ref 10–154)

## 2017-02-20 DIAGNOSIS — I780 Hereditary hemorrhagic telangiectasia: Secondary | ICD-10-CM | POA: Diagnosis not present

## 2017-02-20 DIAGNOSIS — L811 Chloasma: Secondary | ICD-10-CM | POA: Diagnosis not present

## 2017-02-21 ENCOUNTER — Ambulatory Visit (INDEPENDENT_AMBULATORY_CARE_PROVIDER_SITE_OTHER): Payer: BLUE CROSS/BLUE SHIELD | Admitting: Physician Assistant

## 2017-02-21 ENCOUNTER — Encounter: Payer: Self-pay | Admitting: Physician Assistant

## 2017-02-21 VITALS — BP 126/80 | HR 64 | Wt 150.0 lb

## 2017-02-21 DIAGNOSIS — K640 First degree hemorrhoids: Secondary | ICD-10-CM | POA: Diagnosis not present

## 2017-02-21 DIAGNOSIS — Z23 Encounter for immunization: Secondary | ICD-10-CM | POA: Diagnosis not present

## 2017-02-21 MED ORDER — HYDROCORTISONE 2.5 % EX CREA: TOPICAL_CREAM | Freq: Two times a day (BID) | CUTANEOUS | 1 refills | Status: DC

## 2017-02-21 NOTE — Progress Notes (Signed)
   Subjective:    Patient ID: Darlene Grant, female    DOB: 1975-08-11, 41 y.o.   MRN: [id number]  HPI  Pt is a 41 yo female who presents to the clinic with a bump on her rectum that is itchy and almost feels like it moves. She has not noticed it until last Thursday. She denies any constipation or straining. She has started a new work out with some heavier lifting last week. She did use some clobetasol that did help symptoms.   .. Active Ambulatory Problems    Diagnosis Date Noted  . Ligament tear 07/05/2012  . Pregnancy with history of cesarean section, antepartum 11/21/2013  . Calcification of right breast 02/19/2015  . 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 Supervision of normal pregnancy, antepartum 11/22/2015  . AMA (advanced maternal age) multigravida 35+ 11/22/2015  . Low lying posterior placenta, antepartum 02/01/2016  . Post-operative complication 17/61/6073  . History of macrosomia in infant in prior pregnancy, currently pregnant 02/16/2016  . Post-operative state 06/23/2016  . Grade I hemorrhoids 02/23/2017   Resolved Ambulatory Problems    Diagnosis Date Noted  . Acute sinusitis, unspecified 10/13/2009  . Anemia 07/05/2012  . PROM (premature rupture of membranes) 11/19/2013  . 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 Yeast cystitis 05/19/2015   Past Medical History:  Diagnosis Date  . Abnormal Pap smear   . Anemia   . Breast pain, left   . Candida vaginitis   . Rectal itching   . Vaginal Pap smear, abnormal       Review of Systems See HPI.     Objective:   Physical Exam  Constitutional: She appears well-developed and well-nourished.  Genitourinary:             Assessment & Plan:  Marland KitchenMarland KitchenHillary was seen today for check bump around rectum and medication management.  Diagnoses and all orders for this visit:  Grade I hemorrhoids -     hydrocortisone 2.5 % cream; Apply topically 2 (two) times daily. For up to 7 days at a time.  Need for  immunization against influenza -     Flu Vaccine QUAD 36+ mos IM   HO given. Discussed symptomatic care with sitz baths. Hydrocortisone cream given. Follow up as needed.

## 2017-02-21 NOTE — Patient Instructions (Signed)
Hemorrhoids Hemorrhoids are swollen veins in and around the rectum or anus. There are two types of hemorrhoids:  Internal hemorrhoids. These occur in the veins that are just inside the rectum. They may poke through to the outside and become irritated and painful.  External hemorrhoids. These occur in the veins that are outside of the anus and can be felt as a painful swelling or hard lump near the anus.  Most hemorrhoids do not cause serious problems, and they can be managed with home treatments such as diet and lifestyle changes. If home treatments do not help your symptoms, procedures can be done to shrink or remove the hemorrhoids. What are the causes? This condition is caused by increased pressure in the anal area. This pressure may result from various things, including:  Constipation.  Straining to have a bowel movement.  Diarrhea.  Pregnancy.  Obesity.  Sitting for long periods of time.  Heavy lifting or other activity that causes you to strain.  Anal sex.  What are the signs or symptoms? Symptoms of this condition include:  Pain.  Anal itching or irritation.  Rectal bleeding.  Leakage of stool (feces).  Anal swelling.  One or more lumps around the anus.  How is this diagnosed? This condition can often be diagnosed through a visual exam. Other exams or tests may also be done, such as:  Examination of the rectal area with a gloved hand (digital rectal exam).  Examination of the anal canal using a small tube (anoscope).  A blood test, if you have lost a significant amount of blood.  A test to look inside the colon (sigmoidoscopy or colonoscopy).  How is this treated? This condition can usually be treated at home. However, various procedures may be done if dietary changes, lifestyle changes, and other home treatments do not help your symptoms. These procedures can help make the hemorrhoids smaller or remove them completely. Some of these procedures involve  surgery, and others do not. Common procedures include:  Rubber band ligation. Rubber bands are placed at the base of the hemorrhoids to cut off the blood supply to them.  Sclerotherapy. Medicine is injected into the hemorrhoids to shrink them.  Infrared coagulation. A type of light energy is used to get rid of the hemorrhoids.  Hemorrhoidectomy surgery. The hemorrhoids are surgically removed, and the veins that supply them are tied off.  Stapled hemorrhoidopexy surgery. A circular stapling device is used to remove the hemorrhoids and use staples to cut off the blood supply to them.  Follow these instructions at home: Eating and drinking  Eat foods that have a lot of fiber in them, such as whole grains, beans, nuts, fruits, and vegetables. Ask your health care provider about taking products that have added fiber (fiber supplements).  Drink enough fluid to keep your urine clear or pale yellow. Managing pain and swelling  Take warm sitz baths for 20 minutes, 3-4 times a day to ease pain and discomfort.  If directed, apply ice to the affected area. Using ice packs between sitz baths may be helpful. ? Put ice in a plastic bag. ? Place a towel between your skin and the bag. ? Leave the ice on for 20 minutes, 2-3 times a day. General instructions  Take over-the-counter and prescription medicines only as told by your health care provider.  Use medicated creams or suppositories as told.  Exercise regularly.  Go to the bathroom when you have the urge to have a bowel movement. Do not wait.    Avoid straining to have bowel movements.  Keep the anal area dry and clean. Use wet toilet paper or moist towelettes after a bowel movement.  Do not sit on the toilet for long periods of time. This increases blood pooling and pain. Contact a health care provider if:  You have increasing pain and swelling that are not controlled by treatment or medicine.  You have uncontrolled bleeding.  You  have difficulty having a bowel movement, or you are unable to have a bowel movement.  You have pain or inflammation outside the area of the hemorrhoids. This information is not intended to replace advice given to you by your health care provider. Make sure you discuss any questions you have with your health care provider. Document Released: 06/02/2000 Document Revised: 11/03/2015 Document Reviewed: 02/17/2015 Elsevier Interactive Patient Education  2017 [employer]  

## 2017-02-23 ENCOUNTER — Encounter: Payer: Self-pay | Admitting: Physician Assistant

## 2017-02-23 DIAGNOSIS — K640 First degree hemorrhoids: Secondary | ICD-10-CM

## 2017-02-23 HISTORY — DX: First degree hemorrhoids: K64.0

## 2017-04-08 ENCOUNTER — Encounter: Payer: Self-pay | Admitting: Physician Assistant

## 2017-08-02 ENCOUNTER — Telehealth: Payer: BLUE CROSS/BLUE SHIELD | Admitting: Family

## 2017-08-02 DIAGNOSIS — J028 Acute pharyngitis due to other specified organisms: Secondary | ICD-10-CM

## 2017-08-02 DIAGNOSIS — B9689 Other specified bacterial agents as the cause of diseases classified elsewhere: Secondary | ICD-10-CM

## 2017-08-02 MED ORDER — AZITHROMYCIN 250 MG PO TABS: ORAL_TABLET | ORAL | 0 refills | Status: DC

## 2017-08-02 MED ORDER — PREDNISONE 5 MG PO TABS: 5.0000 mg | ORAL_TABLET | ORAL | 0 refills | Status: DC

## 2017-08-02 MED ORDER — BENZONATATE 100 MG PO CAPS: 100.0000 mg | ORAL_CAPSULE | Freq: Three times a day (TID) | ORAL | 0 refills | Status: DC | PRN

## 2017-08-02 NOTE — Progress Notes (Signed)

## 2017-08-13 ENCOUNTER — Telehealth: Payer: BLUE CROSS/BLUE SHIELD | Admitting: Family

## 2017-08-13 DIAGNOSIS — B9689 Other specified bacterial agents as the cause of diseases classified elsewhere: Secondary | ICD-10-CM

## 2017-08-13 DIAGNOSIS — J028 Acute pharyngitis due to other specified organisms: Secondary | ICD-10-CM

## 2017-08-13 MED ORDER — DOXYCYCLINE HYCLATE 100 MG PO TABS
100.0000 mg | ORAL_TABLET | Freq: Two times a day (BID) | ORAL | 0 refills | Status: DC
Start: 2017-08-13 — End: 2018-01-15

## 2017-08-13 MED ORDER — BENZONATATE 100 MG PO CAPS: 100.0000 mg | ORAL_CAPSULE | Freq: Three times a day (TID) | ORAL | 0 refills | Status: DC | PRN

## 2017-08-13 MED ORDER — PREDNISONE 5 MG PO TABS: 5.0000 mg | ORAL_TABLET | ORAL | 0 refills | Status: DC

## 2017-08-13 NOTE — Progress Notes (Signed)
Thank you for the details you included in the comment boxes. Those details are very helpful in determining the best course of treatment for you and help [place] to provide the best care. Typically, we suggest waiting a few days after an antibiotic finishes just to be sure. However, that is what you did and you are worsening. I agree with you that it probably did not kill the infection. See new plan below.  We are sorry that you are not feeling well.  Here is how we plan to help!  Based on your presentation I believe you most likely have A cough due to bacteria.  When patients have a fever and a productive cough with a change in color or increased sputum production, we are concerned about bacterial bronchitis.  If left untreated it can progress to pneumonia.  If your symptoms do not improve with your treatment plan it is important that you contact your provider.   I have prescribed Doxycycline 100 mg twice a day for 7 days     In addition you may use A non-prescription cough medication called Mucinex DM: take 2 tablets every 12 hours. and A prescription cough medication called Tessalon Perles 100mg . You may take 1-2 capsules every 8 hours as needed for your cough.  Sterapred 5 mg dosepak  From your responses in the eVisit questionnaire you describe inflammation in the upper respiratory tract which is causing a significant cough.  This is commonly called Bronchitis and has four common causes:    Allergies  Viral Infections  Acid Reflux  Bacterial Infection Allergies, viruses and acid reflux are treated by controlling symptoms or eliminating the cause. An example might be a cough caused by taking certain blood pressure medications. You stop the cough by changing the medication. Another example might be a cough caused by acid reflux. Controlling the reflux helps control the cough.  USE OF BRONCHODILATOR ("RESCUE") INHALERS: There is a risk from using your bronchodilator too frequently.  The risk is that  over-reliance on a medication which only relaxes the muscles surrounding the breathing tubes can reduce the effectiveness of medications prescribed to reduce swelling and congestion of the tubes themselves.  Although you feel brief relief from the bronchodilator inhaler, your asthma may actually be worsening with the tubes becoming more swollen and filled with mucus.  This can delay other crucial treatments, such as oral steroid medications. If you need to use a bronchodilator inhaler daily, several times per day, you should discuss this with your provider.  There are probably better treatments that could be used to keep your asthma under control.     HOME CARE . Only take medications as instructed by your medical team. . Complete the entire course of an antibiotic. . Drink plenty of fluids and get plenty of rest. . Avoid close contacts especially the very young and the elderly . Cover your mouth if you cough or cough into your sleeve. . Always remember to wash your hands . A steam or ultrasonic humidifier can help congestion.   GET HELP RIGHT AWAY IF: . You develop worsening fever. . You become short of breath . You cough up blood. . Your symptoms persist after you have completed your treatment plan MAKE SURE YOU   Understand these instructions.  Will watch your condition.  Will get help right away if you are not doing well or get worse.  Your e-visit answers were reviewed by a board certified advanced clinical practitioner to complete your personal care  plan.  Depending on the condition, your plan could have included both over the counter or prescription medications. If there is a problem please reply  once you have received a response from your provider. Your safety is important to [place].  If you have drug allergies check your prescription carefully.    You can use MyChart to ask questions about today's visit, request a non-urgent call back, or ask for a work or school excuse for 24 hours  related to this e-Visit. If it has been greater than 24 hours you will need to follow up with your provider, or enter a new e-Visit to address those concerns. You will get an e-mail in the next two days asking about your experience.  I hope that your e-visit has been valuable and will speed your recovery. Thank you for using e-visits.

## 2017-08-15 ENCOUNTER — Ambulatory Visit: Payer: Self-pay | Admitting: Physician Assistant

## 2017-08-17 ENCOUNTER — Ambulatory Visit: Payer: Self-pay | Admitting: Physician Assistant

## 2017-11-13 DIAGNOSIS — D229 Melanocytic nevi, unspecified: Secondary | ICD-10-CM | POA: Diagnosis not present

## 2018-01-15 ENCOUNTER — Telehealth: Payer: Self-pay | Admitting: Physician Assistant

## 2018-01-15 ENCOUNTER — Other Ambulatory Visit: Payer: Self-pay | Admitting: Physician Assistant

## 2018-01-15 ENCOUNTER — Ambulatory Visit (INDEPENDENT_AMBULATORY_CARE_PROVIDER_SITE_OTHER): Payer: BLUE CROSS/BLUE SHIELD | Admitting: Physician Assistant

## 2018-01-15 ENCOUNTER — Encounter: Payer: Self-pay | Admitting: Physician Assistant

## 2018-01-15 VITALS — BP 115/76 | HR 72 | Ht 64.25 in | Wt 146.0 lb

## 2018-01-15 DIAGNOSIS — Z13220 Encounter for screening for lipoid disorders: Secondary | ICD-10-CM

## 2018-01-15 DIAGNOSIS — R5383 Other fatigue: Secondary | ICD-10-CM

## 2018-01-15 DIAGNOSIS — Z131 Encounter for screening for diabetes mellitus: Secondary | ICD-10-CM | POA: Diagnosis not present

## 2018-01-15 DIAGNOSIS — Z0184 Encounter for antibody response examination: Secondary | ICD-10-CM

## 2018-01-15 DIAGNOSIS — Z0000 Encounter for general adult medical examination without abnormal findings: Secondary | ICD-10-CM | POA: Diagnosis not present

## 2018-01-15 DIAGNOSIS — M62838 Other muscle spasm: Secondary | ICD-10-CM

## 2018-01-15 DIAGNOSIS — J0140 Acute pansinusitis, unspecified: Secondary | ICD-10-CM

## 2018-01-15 DIAGNOSIS — G5702 Lesion of sciatic nerve, left lower limb: Secondary | ICD-10-CM

## 2018-01-15 DIAGNOSIS — Z111 Encounter for screening for respiratory tuberculosis: Secondary | ICD-10-CM

## 2018-01-15 HISTORY — DX: Lesion of sciatic nerve, left lower limb: G57.02

## 2018-01-15 MED ORDER — CYCLOBENZAPRINE HCL 10 MG PO TABS: 5.0000 mg | ORAL_TABLET | Freq: Three times a day (TID) | ORAL | 1 refills | Status: DC | PRN

## 2018-01-15 MED ORDER — AZITHROMYCIN 250 MG PO TABS: ORAL_TABLET | ORAL | 0 refills | Status: DC

## 2018-01-15 NOTE — Telephone Encounter (Signed)
Please print and mail piriformis rehab exercises to patient. I forgot to give in office. Please call pt to let her know they are coming in the mail.

## 2018-01-15 NOTE — Patient Instructions (Addendum)
Chronic Fatigue Syndrome Chronic fatigue syndrome (CFS) is a condition that causes extreme tiredness (fatigue). This fatigue does not improve with rest, and it gets worse with physical or mental activity. You may have several other symptoms along with fatigue. Symptoms may come and go, but they generally last for months. Sometimes, CFS gets better over time, but it can be a lifelong condition. There is no cure, but there are many possible treatments. You will need to work with your health care providers to find a treatment plan that works best for you. What are the causes? The cause of CFS is not known. There may be more than one cause. Possible causes include:  An infection.  An abnormal body defense system (immune system).  Low blood pressure.  Poor diet.  Physical or emotional stress.  What increases the risk? You are more likely to develop this condition if:  You are female.  You are 10?40 years old.  You have a family history of CFS.  You live with a lot of emotional stress.  What are the signs or symptoms? The main symptom of CFS is fatigue that is severe enough to interfere with day-to-day activities. This fatigue does not get better with rest, and it gets worse with physical or mental activity. There are eight other major symptoms of CFS:  Lack of energy (malaise) that lasts more than 24 hours after physical exertion.  Sleep that does not relieve fatigue (unrefreshing sleep).  Short-term memory loss or confusion.  Joint pain without redness or swelling.  Muscle aches.  Headaches.  Painful and swollen glands (lymph nodes) in the neck or under the arms.  Sore throat.  You may also have:  Abdominal cramps, constipation, or diarrhea (irritable bowel).  Chills.  Night sweats.  Vision changes.  Dizziness.  Mental confusion (brain fog).  Clumsiness.  Sensitivity to food, noise, or odors.  Mood swings, depression, or anxiety attacks.  How is this  diagnosed? There are no tests that can diagnose this condition. Your health care provider will make the diagnosis based on your medical history, a physical exam, and a mental health exam. However, it is important to make sure that your symptoms are not caused by another medical condition. You may have lab tests or X-rays to rule out other conditions. For your health care provider to diagnose CFS:  You must have had fatigue for at least 6 straight months.  Fatigue must be your first symptom, and it must be severe enough to interfere with day-to-day activities.  There must be no other cause found for the fatigue.  You must also have at least four of the eight other major symptoms of CFS.  How is this treated? There is no cure for CFS. The condition affects everyone differently. You will need to work with your team of health care providers to find the best treatments for your symptoms. Your team may include your primary care provider, physical and exercise therapists, and mental health therapists. Treatment may include:  Improving sleep with a regular bedtime routine.  Avoiding caffeine, alcohol, and tobacco.  Doing light exercise and stretching during the day.  Taking medicines to help you sleep or to relieve joint or muscle pain.  Learning and practicing relaxation techniques.  Using memory aids or doing brainteasers to improve memory and concentration.  Seeing a mental health therapist to evaluate and treat depression, if necessary.  Trying massage therapy, acupuncture, and movement exercises, such as yoga or tai chi.  Follow these  instructions at home:  Activity  Exercise regularly, as told by your health care provider.  Avoid fatigue by pacing yourself during the day and getting enough sleep at night.  Go to bed and get up at the same time every day. Eating and drinking  Avoid caffeine and alcohol.  Avoid heavy meals in the evening.  Eat a well-balanced diet. General  instructions  Take over-the-counter and prescription medicines only as told by your health care provider.  Do not use herbal or dietary supplements unless they are approved by your health care provider.  Maintain a healthy weight.  Avoid stress and use stress-reducing techniques that you learn in therapy.  Do not use any products that contain nicotine or tobacco, such as cigarettes and e-cigarettes. If you need help quitting, ask your health care provider.  Consider joining a CFS support group.  Keep all follow-up visits as told by your health care provider. This is important. Contact a health care provider if:  Your symptoms do not get better or they get worse.  You feel angry, guilty, anxious, or depressed. This information is not intended to replace advice given to you by your health care provider. Make sure you discuss any questions you have with your health care provider. Document Released: 07/13/2004 Document Revised: 02/10/2016 Document Reviewed: 09/13/2015 Elsevier Interactive Patient Education  2018 [employer]. Fatigue Fatigue is feeling tired all of the time, a lack of energy, or a lack of motivation. Occasional or mild fatigue is often a normal response to activity or life in general. However, long-lasting (chronic) or extreme fatigue may indicate an underlying medical condition. Follow these instructions at home: Watch your fatigue for any changes. The following actions may help to lessen any discomfort you are feeling:  Talk to your health care provider about how much sleep you need each night. Try to get the required amount every night.  Take medicines only as directed by your health care provider.  Eat a healthy and nutritious diet. Ask your health care provider if you need help changing your diet.  Drink enough fluid to keep your urine clear or pale yellow.  Practice ways of relaxing, such as yoga, meditation, massage therapy, or acupuncture.  Exercise  regularly.  Change situations that cause you stress. Try to keep your work and personal routine reasonable.  Do not abuse illegal drugs.  Limit alcohol intake to no more than 1 drink per day for nonpregnant women and 2 drinks per day for men. One drink equals 12 ounces of beer, 5 ounces of wine, or 1 ounces of hard liquor.  Take a multivitamin, if directed by your health care provider.  Contact a health care provider if:  Your fatigue does not get better.  You have a fever.  You have unintentional weight loss or gain.  You have headaches.  You have difficulty: ? Falling asleep. ? Sleeping throughout the night.  You feel angry, guilty, anxious, or sad.  You are unable to have a bowel movement (constipation).  You skin is dry.  Your legs or another part of your body is swollen. Get help right away if:  You feel confused.  Your vision is blurry.  You feel faint or pass out.  You have a severe headache.  You have severe abdominal, pelvic, or back pain.  You have chest pain, shortness of breath, or an irregular or fast heartbeat.  You are unable to urinate or you urinate less than normal.  You develop abnormal bleeding, such  as bleeding from the rectum, vagina, nose, lungs, or nipples.  You vomit blood.  You have thoughts about harming yourself or committing suicide.  You are worried that you might harm someone else. This information is not intended to replace advice given to you by your health care provider. Make sure you discuss any questions you have with your health care provider. Document Released: 04/02/2007 Document Revised: 11/11/2015 Document Reviewed: 10/07/2013 Elsevier Interactive Patient Education  2018 [place] Maintenance, Female Adopting a healthy lifestyle and getting preventive care can go a long way to promote health and wellness. Talk with your health care provider about what schedule of regular examinations is right for you. This  is a good chance for you to check in with your provider about disease prevention and staying healthy. In between checkups, there are plenty of things you can do on your own. Experts have done a lot of research about which lifestyle changes and preventive measures are most likely to keep you healthy. Ask your health care provider for more information. Weight and diet Eat a healthy diet  Be sure to include plenty of vegetables, fruits, low-fat dairy products, and lean protein.  Do not eat a lot of foods high in solid fats, added sugars, or salt.  Get regular exercise. This is one of the most important things you can do for your health. ? Most adults should exercise for at least 150 minutes each week. The exercise should increase your heart rate and make you sweat (moderate-intensity exercise). ? Most adults should also do strengthening exercises at least twice a week. This is in addition to the moderate-intensity exercise.  Maintain a healthy weight  Body mass index (BMI) is a measurement that can be used to identify possible weight problems. It estimates body fat based on height and weight. Your health care provider can help determine your BMI and help you achieve or maintain a healthy weight.  For females 48 years of age and older: ? A BMI below 18.5 is considered underweight. ? A BMI of 18.5 to 24.9 is normal. ? A BMI of 25 to 29.9 is considered overweight. ? A BMI of 30 and above is considered obese.  Watch levels of cholesterol and blood lipids  You should start having your blood tested for lipids and cholesterol at 40 years of age, then have this test every 5 years.  You may need to have your cholesterol levels checked more often if: ? Your lipid or cholesterol levels are high. ? You are older than 40 years of age. ? You are at high risk for heart disease.  Cancer screening Lung Cancer  Lung cancer screening is recommended for adults 40-29 years old who are at high risk for  lung cancer because of a history of smoking.  A yearly low-dose CT scan of the lungs is recommended for people who: ? Currently smoke. ? Have quit within the past 15 years. ? Have at least a 30-pack-year history of smoking. A pack year is smoking an average of one pack of cigarettes a day for 1 year.  Yearly screening should continue until it has been 15 years since you quit.  Yearly screening should stop if you develop a health problem that would prevent you from having lung cancer treatment.  Breast Cancer  Practice breast self-awareness. This means understanding how your breasts normally appear and feel.  It also means doing regular breast self-exams. Let your health care provider know about any changes, no matter  how small.  If you are in your 20s or 30s, you should have a clinical breast exam (CBE) by a health care provider every 1-3 years as part of a regular health exam.  If you are 52 or older, have a CBE every year. Also consider having a breast X-ray (mammogram) every year.  If you have a family history of breast cancer, talk to your health care provider about genetic screening.  If you are at high risk for breast cancer, talk to your health care provider about having an MRI and a mammogram every year.  Breast cancer gene (BRCA) assessment is recommended for women who have family members with BRCA-related cancers. BRCA-related cancers include: ? Breast. ? Ovarian. ? Tubal. ? Peritoneal cancers.  Results of the assessment will determine the need for genetic counseling and BRCA1 and BRCA2 testing.  Cervical Cancer Your health care provider may recommend that you be screened regularly for cancer of the pelvic organs (ovaries, uterus, and vagina). This screening involves a pelvic examination, including checking for microscopic changes to the surface of your cervix (Pap test). You may be encouraged to have this screening done every 3 years, beginning at age 64.  For women  ages 20-65, health care providers may recommend pelvic exams and Pap testing every 3 years, or they may recommend the Pap and pelvic exam, combined with testing for human papilloma virus (HPV), every 5 years. Some types of HPV increase your risk of cervical cancer. Testing for HPV may also be done on women of any age with unclear Pap test results.  Other health care providers may not recommend any screening for nonpregnant women who are considered low risk for pelvic cancer and who do not have symptoms. Ask your health care provider if a screening pelvic exam is right for you.  If you have had past treatment for cervical cancer or a condition that could lead to cancer, you need Pap tests and screening for cancer for at least 20 years after your treatment. If Pap tests have been discontinued, your risk factors (such as having a new sexual partner) need to be reassessed to determine if screening should resume. Some women have medical problems that increase the chance of getting cervical cancer. In these cases, your health care provider may recommend more frequent screening and Pap tests.  Colorectal Cancer  This type of cancer can be detected and often prevented.  Routine colorectal cancer screening usually begins at 40 years of age and continues through 40 years of age.  Your health care provider may recommend screening at an earlier age if you have risk factors for colon cancer.  Your health care provider may also recommend using home test kits to check for hidden blood in the stool.  A small camera at the end of a tube can be used to examine your colon directly (sigmoidoscopy or colonoscopy). This is done to check for the earliest forms of colorectal cancer.  Routine screening usually begins at age 66.  Direct examination of the colon should be repeated every 5-10 years through 40 years of age. However, you may need to be screened more often if early forms of precancerous polyps or small growths  are found.  Skin Cancer  Check your skin from head to toe regularly.  Tell your health care provider about any new moles or changes in moles, especially if there is a change in a mole's shape or color.  Also tell your health care provider if you have a  mole that is larger than the size of a pencil eraser.  Always use sunscreen. Apply sunscreen liberally and repeatedly throughout the day.  Protect yourself by wearing long sleeves, pants, a wide-brimmed hat, and sunglasses whenever you are outside.  Heart disease, diabetes, and high blood pressure  High blood pressure causes heart disease and increases the risk of stroke. High blood pressure is more likely to develop in: ? People who have blood pressure in the high end of the normal range (130-139/85-89 mm Hg). ? People who are overweight or obese. ? People who are African American.  If you are 18-41 years of age, have your blood pressure checked every 3-5 years. If you are 36 years of age or older, have your blood pressure checked every year. You should have your blood pressure measured twice-once when you are at a hospital or clinic, and once when you are not at a hospital or clinic. Record the average of the two measurements. To check your blood pressure when you are not at a hospital or clinic, you can use: ? An automated blood pressure machine at a pharmacy. ? A home blood pressure monitor.  If you are between 39 years and 84 years old, ask your health care provider if you should take aspirin to prevent strokes.  Have regular diabetes screenings. This involves taking a blood sample to check your fasting blood sugar level. ? If you are at a normal weight and have a low risk for diabetes, have this test once every three years after 40 years of age. ? If you are overweight and have a high risk for diabetes, consider being tested at a younger age or more often. Preventing infection Hepatitis B  If you have a higher risk for hepatitis  B, you should be screened for this virus. You are considered at high risk for hepatitis B if: ? You were born in a country where hepatitis B is common. Ask your health care provider which countries are considered high risk. ? Your parents were born in a high-risk country, and you have not been immunized against hepatitis B (hepatitis B vaccine). ? You have HIV or AIDS. ? You use needles to inject street drugs. ? You live with someone who has hepatitis B. ? You have had sex with someone who has hepatitis B. ? You get hemodialysis treatment. ? You take certain medicines for conditions, including cancer, organ transplantation, and autoimmune conditions.  Hepatitis C  Blood testing is recommended for: ? Everyone born from [birth year] through 1963. ? Anyone with known risk factors for hepatitis C.  Sexually transmitted infections (STIs)  You should be screened for sexually transmitted infections (STIs) including gonorrhea and chlamydia if: ? You are sexually active and are younger than 40 years of age. ? You are older than 40 years of age and your health care provider tells you that you are at risk for this type of infection. ? Your sexual activity has changed since you were last screened and you are at an increased risk for chlamydia or gonorrhea. Ask your health care provider if you are at risk.  If you do not have HIV, but are at risk, it may be recommended that you take a prescription medicine daily to prevent HIV infection. This is called pre-exposure prophylaxis (PrEP). You are considered at risk if: ? You are sexually active and do not regularly use condoms or know the HIV status of your partner(s). ? You take drugs by injection. ? You are  sexually active with a partner who has HIV.  Talk with your health care provider about whether you are at high risk of being infected with HIV. If you choose to begin PrEP, you should first be tested for HIV. You should then be tested every 3 months for as  long as you are taking PrEP. Pregnancy  If you are premenopausal and you may become pregnant, ask your health care provider about preconception counseling.  If you may become pregnant, take 400 to 800 micrograms (mcg) of folic acid every day.  If you want to prevent pregnancy, talk to your health care provider about birth control (contraception). Osteoporosis and menopause  Osteoporosis is a disease in which the bones lose minerals and strength with aging. This can result in serious bone fractures. Your risk for osteoporosis can be identified using a bone density scan.  If you are 23 years of age or older, or if you are at risk for osteoporosis and fractures, ask your health care provider if you should be screened.  Ask your health care provider whether you should take a calcium or vitamin D supplement to lower your risk for osteoporosis.  Menopause may have certain physical symptoms and risks.  Hormone replacement therapy may reduce some of these symptoms and risks. Talk to your health care provider about whether hormone replacement therapy is right for you. Follow these instructions at home:  Schedule regular health, dental, and eye exams.  Stay current with your immunizations.  Do not use any tobacco products including cigarettes, chewing tobacco, or electronic cigarettes.  If you are pregnant, do not drink alcohol.  If you are breastfeeding, limit how much and how often you drink alcohol.  Limit alcohol intake to no more than 1 drink per day for nonpregnant women. One drink equals 12 ounces of beer, 5 ounces of wine, or 1 ounces of hard liquor.  Do not use street drugs.  Do not share needles.  Ask your health care provider for help if you need support or information about quitting drugs.  Tell your health care provider if you often feel depressed.  Tell your health care provider if you have ever been abused or do not feel safe at home. This information is not intended  to replace advice given to you by your health care provider. Make sure you discuss any questions you have with your health care provider. Document Released: 12/19/2010 Document Revised: 11/11/2015 Document Reviewed: 03/09/2015 Elsevier Interactive Patient Education  2018 [employer].  Piriformis Syndrome Rehab Ask your health care provider which exercises are safe for you. Do exercises exactly as told by your health care provider and adjust them as directed. It is normal to feel mild stretching, pulling, tightness, or discomfort as you do these exercises, but you should stop right away if you feel sudden pain or your pain gets worse.Do not begin these exercises until told by your health care provider. Stretching and range of motion exercises These exercises warm up your muscles and joints and improve the movement and flexibility of your hip and pelvis. These exercises also help to relieve pain, numbness, and tingling. Exercise A: Hip rotators  1. Lie on your back on a firm surface. 2. Pull your left / right knee toward your same shoulder with your left / right hand until your knee is pointing toward the ceiling. Hold your left / right ankle with your other hand. 3. Keeping your knee steady, gently pull your left / right ankle toward your other  shoulder until you feel a stretch in your buttocks. 4. Hold this position for __________ seconds. Repeat __________ times. Complete this stretch __________ times a day. Exercise B: Hip extensors 1. Lie on your back on a firm surface. Both of your legs should be straight. 2. Pull your left / right knee to your chest. Hold your leg in this position by holding onto the back of your thigh or the front of your knee. 3. Hold this position for __________ seconds. 4. Slowly return to the starting position. Repeat __________ times. Complete this stretch __________ times a day. Strengthening exercises These exercises build strength and endurance in your hip and  thigh muscles. Endurance is the ability to use your muscles for a long time, even after they get tired. Exercise C: Straight leg raises ( hip abductors) 1. Lie on your side with your left / right leg in the top position. Lie so your head, shoulder, knee, and hip line up. Bend your bottom knee to help you balance. 2. Lift your top leg up 4-6 inches (10-15 cm), keeping your toes pointed straight ahead. 3. Hold this position for __________ seconds. 4. Slowly lower your leg to the starting position. Let your muscles relax completely. Repeat __________ times. Complete this exercise__________ times a day. Exercise D: Hip abductors and rotators, quadruped  1. Get on your hands and knees on a firm, lightly padded surface. Your hands should be directly below your shoulders, and your knees should be directly below your hips. 2. Lift your left / right knee out to the side. Keep your knee bent. Do not twist your body. 3. Hold this position for __________ seconds. 4. Slowly lower your leg. Repeat __________ times. Complete this exercise__________ times a day. Exercise E: Straight leg raises ( hip extensors) 1. Lie on your abdomen on a bed or a firm surface with a pillow under your hips. 2. Squeeze your buttock muscles and lift your left / right thigh off the bed. Do not let your back arch. 3. Hold this position for __________ seconds. 4. Slowly return to the starting position. Let your muscles relax completely before doing another repetition. Repeat __________ times. Complete this exercise__________ times a day. This information is not intended to replace advice given to you by your health care provider. Make sure you discuss any questions you have with your health care provider. Document Released: 06/05/2005 Document Revised: 02/08/2016 Document Reviewed: 05/18/2015 Elsevier Interactive Patient Education  [employer].

## 2018-01-15 NOTE — Progress Notes (Signed)
k Subjective:     Darlene Grant is a 42 y.o. female and is here for a comprehensive physical exam. The patient reports problems - she is having some fatigue. she is at day 10 of a cold. she continues to have some remaining sinus pressure and congstion. she does feel like she is improving. no fever or chills. she stopped breast feeding a few months ago. she is not sleeping due to her young children not sleeping. she wants to make sure everything is ok. and nothing causing fatigue. denies any depression.   Last pap  12/2016. Normal cells, neg HPV.   Social History   Socioeconomic History  . Marital status: Married    Spouse name: Not on file  . Number of children: Not on file  . Years of education: Not on file  . Highest education level: Not on file  Occupational History  . Occupation: occupational therapist  Social Needs  . Financial resource strain: Not on file  . Food insecurity:    Worry: Not on file    Inability: Not on file  . Transportation needs:    Medical: Not on file    Non-medical: Not on file  Tobacco Use  . Smoking status: Never Smoker  . Smokeless tobacco: Never Used  Substance and Sexual Activity  . Alcohol use: Yes    Alcohol/week: 0.0 oz    Comment: occassionally  . Drug use: No  . Sexual activity: Yes    Partners: Male    Birth control/protection: Condom  Lifestyle  . Physical activity:    Days per week: Not on file    Minutes per session: Not on file  . Stress: Not on file  Relationships  . Social connections:    Talks on phone: Not on file    Gets together: Not on file    Attends religious service: Not on file    Active member of club or organization: Not on file    Attends meetings of clubs or organizations: Not on file    Relationship status: Not on file  . Intimate partner violence:    Fear of current or ex partner: Not on file    Emotionally abused: Not on file    Physically abused: Not on file    Forced sexual activity: Not on  file  Other Topics Concern  . Not on file  Social History Narrative  . Not on file   Health Maintenance  Topic Date Due  . MAMMOGRAM  03/31/2016  . PAP SMEAR  12/18/2017  . INFLUENZA VACCINE  01/17/2018  . TETANUS/TDAP  02/23/2025  . HIV Screening  Completed    The following portions of the patient's history were reviewed and updated as appropriate: allergies, current medications, past family history, past medical history, past social history, past surgical history and problem list.  Review of Systems A comprehensive review of systems was negative.   Objective:    BP 115/76   Pulse 72   Ht 5' 4.25" (1.632 m)   Wt 146 lb (66.2 kg)   BMI 24.87 kg/m  General appearance: alert, cooperative and appears stated age Head: Normocephalic, without obvious abnormality, atraumatic Eyes: conjunctivae/corneas clear. PERRL, EOM's intact. Fundi benign. Ears: normal TM's and external ear canals both ears Nose: Nares normal. Septum midline. Mucosa normal. No drainage or sinus tenderness. Throat: lips, mucosa, and tongue normal; teeth and gums normal Neck: no adenopathy, no carotid bruit, no JVD, supple, symmetrical, trachea midline and thyroid not enlarged, symmetric, no  tenderness/mass/nodules Back: symmetric, no curvature. ROM normal. No CVA tenderness. Lungs: clear to auscultation bilaterally Heart: regular rate and rhythm, S1, S2 normal, no murmur, click, rub or gallop Abdomen: soft, non-tender; bowel sounds normal; no masses,  no organomegaly Pelvic: external genitalia normal, no adnexal masses or tenderness, no cervical motion tenderness and uterus normal size, shape, and consistency Extremities: extremities normal, atraumatic, no cyanosis or edema Pulses: 2+ and symmetric Skin: Skin color, texture, turgor normal. No rashes or lesions Lymph nodes: Cervical, supraclavicular, and axillary nodes normal. Neurologic: Alert and oriented X 3, normal strength and tone. Normal symmetric  reflexes. Normal coordination and gait    Assessment:    Healthy female exam.      Plan:  Marland KitchenMarland KitchenHillary was seen today for annual exam.  Diagnoses and all orders for this visit:  Routine physical examination -     QuantiFERON-TB Gold Plus -     COMPLETE METABOLIC PANEL WITH GFR -     Lipid Panel w/reflex Direct LDL -     TSH -     B12 and Folate Panel -     VITAMIN D 25 Hydroxy (Vit-D Deficiency, Fractures) -     Ferritin -     CBC with Differential/Platelet -     Measles/Mumps/Rubella Immunity -     Sed Rate (ESR)  No energy -     TSH -     B12 and Folate Panel -     VITAMIN D 25 Hydroxy (Vit-D Deficiency, Fractures) -     Ferritin -     CBC with Differential/Platelet  Screening-pulmonary TB -     QuantiFERON-TB Gold Plus  Screening for diabetes mellitus -     COMPLETE METABOLIC PANEL WITH GFR  Screening for lipid disorders -     Lipid Panel w/reflex Direct LDL  Immunity status testing -     Measles/Mumps/Rubella Immunity  Acute non-recurrent pansinusitis -     azithromycin (ZITHROMAX) 250 MG tablet; Take 2 tablets now and then one tablet for 4 days.  Muscle spasm -     cyclobenzaprine (FLEXERIL) 10 MG tablet; Take 0.5-1 tablets (5-10 mg total) by mouth 3 (three) times daily as needed for muscle spasms.  Piriformis syndrome of left side -     cyclobenzaprine (FLEXERIL) 10 MG tablet; Take 0.5-1 tablets (5-10 mg total) by mouth 3 (three) times daily as needed for muscle spasms.  .. Depression screen [facility] 2/9 01/15/2018 12/18/2016  Decreased Interest 0 0  Down, Depressed, Hopeless 0 0  PHQ - 2 Score 0 0  Altered sleeping 1 -  Tired, decreased energy 1 -  Change in appetite 0 -  Feeling bad or failure about yourself  0 -  Trouble concentrating 0 -  Moving slowly or fidgety/restless 0 -  Suicidal thoughts 0 -  PHQ-9 Score 2 -  Difficult doing work/chores Not difficult at all -   .. Discussed 150 minutes of exercise a week.  Encouraged vitamin D 1000 units and  Calcium 133m or 4 servings of dairy a day.  Mammogram scheduled for November 6 months after breast feeding stopped.  Pap up to date. Discussed guidelines. Pelvic done today and normal.  Fasting labs ordered.  TB quantiferon ordered.  MMR immunity to be tested at patients request.   Fatigue labs ordered. Discussed causes of fatigue. Encouraged patient to consider self care with 2 small kids.    Discussed piriformis syndrome. Went over stretches in office. HO given. NSAIds/heat/biofreeze/massage as needed. Follow up  if worsening. Flexeril given as needed.   Likely viral sinusitis. At 10 day mark. zpak given if not improving or worsening.     See After Visit Summary for Counseling Recommendations

## 2018-01-16 ENCOUNTER — Encounter: Payer: BLUE CROSS/BLUE SHIELD | Admitting: Physician Assistant

## 2018-01-16 ENCOUNTER — Encounter: Payer: Self-pay | Admitting: Physician Assistant

## 2018-01-16 DIAGNOSIS — E611 Iron deficiency: Secondary | ICD-10-CM | POA: Insufficient documentation

## 2018-01-16 DIAGNOSIS — R790 Abnormal level of blood mineral: Secondary | ICD-10-CM | POA: Insufficient documentation

## 2018-01-16 NOTE — Progress Notes (Signed)
Call pt: immune to MMR. Cholesterol looks fantastic. Vitamin D normal but low normal. Make sure taking some OTC vitamin D always. Not anemic but iron stores are low. I would start with ferrous sulfate 341m daily and increase iron rich foods. b12 is good.   Overall labs look really good.

## 2018-01-17 ENCOUNTER — Encounter: Payer: Self-pay | Admitting: Physician Assistant

## 2018-01-18 LAB — CBC WITH DIFFERENTIAL/PLATELET
BASOS PCT: 0.3 %
Basophils Absolute: 18 cells/uL (ref 0–200)
Eosinophils Absolute: 31 cells/uL (ref 15–500)
Eosinophils Relative: 0.5 %
HEMATOCRIT: 38.9 % (ref 35.0–45.0)
Hemoglobin: 13.1 g/dL (ref 11.7–15.5)
LYMPHS ABS: 1915 {cells}/uL (ref 850–3900)
MCH: 31 pg (ref 27.0–33.0)
MCHC: 33.7 g/dL (ref 32.0–36.0)
MCV: 92 fL (ref 80.0–100.0)
MPV: 10.2 fL (ref 7.5–12.5)
Monocytes Relative: 5.2 %
NEUTROS ABS: 3819 {cells}/uL (ref 1500–7800)
Neutrophils Relative %: 62.6 %
PLATELETS: 280 10*3/uL (ref 140–400)
RBC: 4.23 10*6/uL (ref 3.80–5.10)
RDW: 12.2 % (ref 11.0–15.0)
Total Lymphocyte: 31.4 %
WBC: 6.1 10*3/uL (ref 3.8–10.8)
WBCMIX: 317 {cells}/uL (ref 200–950)

## 2018-01-18 LAB — TSH: TSH: 0.66 mIU/L

## 2018-01-18 LAB — FERRITIN: Ferritin: 24 ng/mL (ref 16–154)

## 2018-01-18 LAB — COMPLETE METABOLIC PANEL WITH GFR
AG Ratio: 2.2 (calc) (ref 1.0–2.5)
ALKALINE PHOSPHATASE (APISO): 58 U/L (ref 33–115)
ALT: 9 U/L (ref 6–29)
AST: 12 U/L (ref 10–30)
Albumin: 4.8 g/dL (ref 3.6–5.1)
BILIRUBIN TOTAL: 1.3 mg/dL — AB (ref 0.2–1.2)
BUN: 11 mg/dL (ref 7–25)
CHLORIDE: 102 mmol/L (ref 98–110)
CO2: 28 mmol/L (ref 20–32)
Calcium: 9.8 mg/dL (ref 8.6–10.2)
Creat: 0.78 mg/dL (ref 0.50–1.10)
GFR, Est African American: 110 mL/min/{1.73_m2} (ref >=60)
GFR, Est Non African American: 95 mL/min/{1.73_m2} (ref >=60)
Globulin: 2.2 g/dL (calc) (ref 1.9–3.7)
Glucose, Bld: 100 mg/dL — ABNORMAL HIGH (ref 65–99)
POTASSIUM: 4.5 mmol/L (ref 3.5–5.3)
Sodium: 139 mmol/L (ref 135–146)
Total Protein: 7 g/dL (ref 6.1–8.1)

## 2018-01-18 LAB — LIPID PANEL W/REFLEX DIRECT LDL
Cholesterol: 195 mg/dL (ref <200)
HDL: 64 mg/dL (ref >50)
LDL CHOLESTEROL (CALC): 115 mg/dL — AB
Non-HDL Cholesterol (Calc): 131 mg/dL (calc) — ABNORMAL HIGH (ref <130)
Total CHOL/HDL Ratio: 3 (calc) (ref <5.0)
Triglycerides: 70 mg/dL (ref <150)

## 2018-01-18 LAB — VITAMIN D 25 HYDROXY (VIT D DEFICIENCY, FRACTURES): VIT D 25 HYDROXY: 38 ng/mL (ref 30–100)

## 2018-01-18 LAB — QUANTIFERON-TB GOLD PLUS
Mitogen-NIL: >10 IU/mL
NIL: 0.02 [IU]/mL
QUANTIFERON-TB GOLD PLUS: NEGATIVE
TB1-NIL: 0.01 IU/mL
TB2-NIL: 0.07 IU/mL

## 2018-01-18 LAB — B12 AND FOLATE PANEL: Vitamin B-12: 434 pg/mL (ref 200–1100)

## 2018-01-18 LAB — MEASLES/MUMPS/RUBELLA IMMUNITY
MUMPS IGG: 185 [AU]/ml
RUBELLA: 2.39 {index}
Rubeola IgG: >300 AU/mL

## 2018-01-18 LAB — SEDIMENTATION RATE: Sed Rate: 2 mm/h (ref 0–20)

## 2018-01-18 NOTE — Progress Notes (Signed)
Call pt: alert patient TB screen is negative. Does she need copy sent anywhere?

## 2018-01-21 ENCOUNTER — Encounter: Payer: Self-pay | Admitting: Physician Assistant

## 2018-03-20 ENCOUNTER — Other Ambulatory Visit: Payer: Self-pay | Admitting: Physician Assistant

## 2018-03-20 DIAGNOSIS — Z1231 Encounter for screening mammogram for malignant neoplasm of breast: Secondary | ICD-10-CM

## 2018-04-19 ENCOUNTER — Ambulatory Visit
Admission: RE | Admit: 2018-04-19 | Discharge: 2018-04-19 | Disposition: A | Discharge status: Home / Self Care | Payer: BLUE CROSS/BLUE SHIELD | Source: Ambulatory Visit | Attending: Physician Assistant | Admitting: Physician Assistant

## 2018-04-19 DIAGNOSIS — Z1231 Encounter for screening mammogram for malignant neoplasm of breast: Secondary | ICD-10-CM | POA: Diagnosis not present

## 2018-04-23 ENCOUNTER — Encounter: Payer: Self-pay | Admitting: Physician Assistant

## 2018-04-23 ENCOUNTER — Other Ambulatory Visit: Payer: Self-pay | Admitting: Physician Assistant

## 2018-04-23 DIAGNOSIS — N6310 Unspecified lump in the right breast, unspecified quadrant: Secondary | ICD-10-CM

## 2018-05-02 ENCOUNTER — Other Ambulatory Visit: Payer: Self-pay | Admitting: Physician Assistant

## 2018-05-02 ENCOUNTER — Ambulatory Visit
Admission: RE | Admit: 2018-05-02 | Discharge: 2018-05-02 | Disposition: A | Discharge status: Home / Self Care | Payer: BLUE CROSS/BLUE SHIELD | Source: Ambulatory Visit | Attending: Physician Assistant | Admitting: Physician Assistant

## 2018-05-02 DIAGNOSIS — N6310 Unspecified lump in the right breast, unspecified quadrant: Secondary | ICD-10-CM

## 2018-05-02 DIAGNOSIS — R599 Enlarged lymph nodes, unspecified: Secondary | ICD-10-CM

## 2018-05-02 DIAGNOSIS — R922 Inconclusive mammogram: Secondary | ICD-10-CM | POA: Diagnosis not present

## 2018-05-02 DIAGNOSIS — N6489 Other specified disorders of breast: Secondary | ICD-10-CM | POA: Diagnosis not present

## 2018-05-02 DIAGNOSIS — Z853 Personal history of malignant neoplasm of breast: Secondary | ICD-10-CM | POA: Diagnosis not present

## 2018-05-03 ENCOUNTER — Ambulatory Visit
Admission: RE | Admit: 2018-05-03 | Discharge: 2018-05-03 | Disposition: A | Discharge status: Home / Self Care | Payer: BLUE CROSS/BLUE SHIELD | Source: Ambulatory Visit | Attending: Physician Assistant | Admitting: Physician Assistant

## 2018-05-03 DIAGNOSIS — R599 Enlarged lymph nodes, unspecified: Secondary | ICD-10-CM

## 2018-05-03 DIAGNOSIS — N6311 Unspecified lump in the right breast, upper outer quadrant: Secondary | ICD-10-CM | POA: Diagnosis not present

## 2018-05-03 DIAGNOSIS — R590 Localized enlarged lymph nodes: Secondary | ICD-10-CM | POA: Diagnosis not present

## 2018-05-03 DIAGNOSIS — N6310 Unspecified lump in the right breast, unspecified quadrant: Secondary | ICD-10-CM

## 2018-05-03 DIAGNOSIS — C50411 Malignant neoplasm of upper-outer quadrant of right female breast: Secondary | ICD-10-CM | POA: Diagnosis not present

## 2018-05-07 ENCOUNTER — Telehealth: Payer: Self-pay | Admitting: Physician Assistant

## 2018-05-07 NOTE — Telephone Encounter (Signed)
Trying to reach patient to give results regarding mammogram results.

## 2018-05-07 NOTE — Progress Notes (Signed)
I have called twice and not able to leave voicemail. If patient calls back I would like to speak to her regarding her results.

## 2018-05-09 ENCOUNTER — Encounter: Payer: Self-pay | Admitting: *Deleted

## 2018-05-09 ENCOUNTER — Telehealth: Payer: Self-pay | Admitting: Oncology

## 2018-05-09 DIAGNOSIS — Z171 Estrogen receptor negative status [ER-]: Secondary | ICD-10-CM

## 2018-05-09 DIAGNOSIS — C50411 Malignant neoplasm of upper-outer quadrant of right female breast: Secondary | ICD-10-CM | POA: Insufficient documentation

## 2018-05-09 NOTE — Telephone Encounter (Signed)
Spoke to patient to confirm morning [facility] appointment for 11/27, packet will be mailed and emailed to patient

## 2018-05-10 ENCOUNTER — Encounter: Payer: Self-pay | Admitting: *Deleted

## 2018-05-13 ENCOUNTER — Telehealth: Payer: BLUE CROSS/BLUE SHIELD | Admitting: Family

## 2018-05-13 DIAGNOSIS — R399 Unspecified symptoms and signs involving the genitourinary system: Secondary | ICD-10-CM

## 2018-05-13 MED ORDER — NITROFURANTOIN MONOHYD MACRO 100 MG PO CAPS: 100.0000 mg | ORAL_CAPSULE | Freq: Two times a day (BID) | ORAL | 0 refills | Status: DC

## 2018-05-13 NOTE — Progress Notes (Signed)

## 2018-05-14 NOTE — Progress Notes (Signed)
[place]  [phone] [phone]     ID: Darlene Grant DOB: 1975-12-17  MR#: [id number]  ZHY#:[id number]  Patient Care Team: Lavada Mesi as PCP - General (Family Medicine) Rolm Bookbinder, MD as Consulting Physician (General Surgery) Magrinat, Virgie Dad, MD as Consulting Physician (Oncology) Gery Pray, MD as Consulting Physician (Radiation Oncology) Emily Filbert, MD as Consulting Physician (Obstetrics and Gynecology) Lavonna Monarch, MD as Consulting Physician (Dermatology) OTHER MD:   CHIEF COMPLAINT: Triple negative breast cancer  CURRENT TREATMENT: Neoadjuvant chemotherapy   HISTORY OF CURRENT ILLNESS: Darlene Grant had routine screening mammography on 04/19/2018 showing a possible abnormality in the right breast. She underwent unilateral right diagnostic mammography with tomography and right breast ultrasonography at [facility] on 05/02/2018 showing: Breast Density Category C: There was an oval circumscribed mass in the posterior aspect of the upper outer right breast.  On exam this measured 3.5 cm, was firm, irregular and centered on 10-11 o'clock 5 cm from the nipple.  By ultrasonography there was a 3.7 x 1.7 x 1.5 cm elongated, irregular, hypoechoic mass in the 10:30 o'clock position of the right breast, 5 cm from the nipple.  Ultrasound of the right axilla showed 7 abnormal right axillary lymph nodes highly suspicious for metastatic nodes.  Accordingly on 05/03/2018 she proceeded to biopsy of the right breast area in question and one suspicious lymph node. The pathology from this procedure showed ([id number]): Right Breast: invasive mammary carcinoma, grade 3, E-cadherin positive (and therefore ductal) lymphovascular invasion present. Lymph node: Invasive mammary carcinoma Prognostic indicators significant for: estrogen receptor, 0% negative and progesterone receptor, 0% negative. Proliferation  marker Ki67 at 75%. HER2 negative (1+).  The patient's subsequent history is as detailed below.  INTERVAL HISTORY: Darlene Grant was evaluated in the multidisciplinary breast cancer clinic on 05/15/2018 accompanied by her husband, [name]. Her case was also presented at the multidisciplinary breast cancer conference on the same day. At that time a preliminary plan was proposed: Proceed to MRI, neoadjuvant chemotherapy, staging studies, adjuvant radiation, and genetics.   REVIEW OF SYSTEMS: She currently has a urinary tract infection, but is taking an antibiotic to treat.  She exercises regularly, several times a week.  The patient denies unusual headaches, visual changes, nausea, vomiting, stiff neck, dizziness, or gait imbalance. There has been no cough, phlegm production, or pleurisy, no chest pain or pressure, and no change in bowel. The patient denies fever, rash, bleeding, unexplained fatigue or unexplained weight loss. A detailed review of systems was otherwise entirely negative.   PAST MEDICAL HISTORY: Past Medical History:  Diagnosis Date  . Abnormal Pap smear    ASCUS 03/13/11  . Anemia   . Breast pain, left   . Candida vaginitis   . Rectal itching   . Vaginal Pap smear, abnormal     PAST SURGICAL HISTORY: Past Surgical History:  Procedure Laterality Date  . BREAST BIOPSY Right 8/16  . CESAREAN SECTION N/A 11/21/2013   Procedure: CESAREAN SECTION;  Surgeon: Alwyn Pea, MD;  Location: [facility];  Service: Obstetrics;  Laterality: N/A;  . CESAREAN SECTION N/A 06/23/2016   Procedure: CESAREAN SECTION;  Surgeon: Emily Filbert, MD;  Location: [facility];  Service: Obstetrics;  Laterality: N/A;  . HAND SURGERY  04/2012   ligament repair  . PILONIDAL CYST EXCISION  2000  . WISDOM TOOTH EXTRACTION  AGE 72    FAMILY HISTORY Family History  Problem Relation Age of Onset  .  Cancer Mother 38       breast, premenopausal  . Breast cancer Mother   . Cancer Maternal Grandmother         skin, stomach  . Cancer Maternal Grandfather        lung    As of November 2019 her father is 81 years old with a history of Parkinson's. Patients' mother is 32 years old with a history of breast cancer, diagnosed age 95, treated with radical mastectomy. The patient has no brothers and two sisters. Besides the patient's mother, the patient has no other family medical history of breast or ovarian cancer. Her maternal grandmother had stomach cancer and her maternal grandfather had lung cancer, --he was a smoker.    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  Her husband, [name], an Chief Financial Officer. She has two children, [name], age 35, and [name], age 61 months.  The patient is not a smoker. She does drink alcohol and drinks an estimated one to two drinks per week.        ADVANCED DIRECTIVES: [name], her husband, is her healthcare department  HEALTH MAINTENANCE: Social History   Tobacco Use  . Smoking status: Never Smoker  . Smokeless tobacco: Never Used  Substance Use Topics  . Alcohol use: Yes    Alcohol/week: 0.0 standard drinks    Comment: occassionally  . Drug use: No     Colonoscopy: no  PAP: 12/2017  Bone density: no   Allergies  Allergen Reactions  . Augmentin [Amoxicillin-Pot Clavulanate]     diarrhea  . Hydrocodone Itching  . Keflex [Cephalexin] Diarrhea    Current Outpatient Medications  Medication Sig Dispense Refill  . AZO-CRANBERRY PO Take by mouth.    . Black Elderberry 50 MG/5ML SYRP Take by mouth.    . nitrofurantoin, macrocrystal-monohydrate, (MACROBID) 100 MG capsule Take 1 capsule (100 mg total) by mouth 2 (two) times daily. 1 po BId 10 capsule 0  . Probiotic Product (PROBIOTIC DAILY PO) Take  1 tablet by mouth.    Marland Kitchen azithromycin (ZITHROMAX) 250 MG tablet Take 2 tablets now and then one tablet for 4 days. 6 tablet 0  . cyclobenzaprine (FLEXERIL) 10 MG tablet Take 0.5-1 tablets (5-10 mg total) by mouth 3 (three) times daily as needed for muscle spasms. 30 tablet 1  . hydrocortisone 2.5 % cream Apply topically 2 (two) times daily. For up to 7 days at a time. (Patient not taking: Reported on 05/15/2018) 30 g 1   No current facility-administered medications for this visit.     OBJECTIVE: Young white woman in no acute distress  Vitals:   05/15/18 0843  BP: 123/88  Pulse: 80  Resp: 18  Temp: 98.6 F (37 C)  SpO2: 100%     Body mass index is 24.56 kg/m.   Wt Readings from Last 3 Encounters:  05/15/18 144 lb 3.2 oz (65.4 kg)  01/15/18 146 lb (66.2 kg)  02/21/17 150 lb (68 kg)      ECOG FS:0 - Asymptomatic  Ocular: Sclerae unicteric, pupils round and equal Ear-nose-throat: Oropharynx clear and moist Lymphatic: No cervical or  supraclavicular adenopathy Lungs no rales or rhonchi Heart regular rate and rhythm Abd soft, nontender, positive bowel sounds MSK no focal spinal tenderness, no joint edema Neuro: non-focal, well-oriented, appropriate affect Breasts: The right breast is status post recent biopsy.  There is a moderate ecchymosis.  There is a palpable mass in the upper lateral aspect of the breast measuring between 2 and 3 cm.  The left breast is benign.  Both axillae are benign.   LAB RESULTS:  CMP     Component Value Date/Time   NA 142 05/15/2018 0827   K 4.0 05/15/2018 0827   CL 106 05/15/2018 0827   CO2 27 05/15/2018 0827   GLUCOSE 95 05/15/2018 0827   BUN 7 05/15/2018 0827   CREATININE 0.76 05/15/2018 0827   CREATININE 0.78 01/15/2018 1033   CALCIUM 10.0 05/15/2018 0827   PROT 7.2 05/15/2018 0827   ALBUMIN 4.4 05/15/2018 0827   AST 10 (L) 05/15/2018 0827   ALT 9 05/15/2018 0827   ALKPHOS 60 05/15/2018 0827   BILITOT 1.1 05/15/2018 0827   GFRNONAA  >60 05/15/2018 0827   GFRNONAA 95 01/15/2018 1033   GFRAA >60 05/15/2018 0827   GFRAA 110 01/15/2018 1033    No results found for: TOTALPROTELP, ALBUMINELP, A1GS, A2GS, BETS, BETA2SER, GAMS, MSPIKE, SPEI  No results found for: KPAFRELGTCHN, LAMBDASER, KAPLAMBRATIO  Lab Results  Component Value Date   WBC 5.7 05/15/2018   NEUTROABS 3.8 05/15/2018   HGB 12.1 05/15/2018   HCT 36.5 05/15/2018   MCV 92.4 05/15/2018   PLT 259 05/15/2018    '@LASTCHEMISTRY'$ @  No results found for: LABCA2  No components found for: FIEPPI951  No results for input(s): INR in the last 168 hours.  No results found for: LABCA2  No results found for: OAC166  No results found for: AYT016  No results found for: WFU932  No results found for: CA2729  No components found for: HGQUANT  No results found for: CEA1 / No results found for: CEA1   No results found for: AFPTUMOR  No results found for: [place]  No results found for: PSA1  Appointment on 05/15/2018  Component Date Value Ref Range Status  . WBC Count 05/15/2018 5.7  4.0 - 10.5 K/uL Final  . RBC 05/15/2018 3.95  3.87 - 5.11 MIL/uL Final  . Hemoglobin 05/15/2018 12.1  12.0 - 15.0 g/dL Final  . HCT 05/15/2018 36.5  36.0 - 46.0 % Final  . MCV 05/15/2018 92.4  80.0 - 100.0 fL Final  . MCH 05/15/2018 30.6  26.0 - 34.0 pg Final  . MCHC 05/15/2018 33.2  30.0 - 36.0 g/dL Final  . RDW 05/15/2018 12.6  11.5 - 15.5 % Final  . Platelet Count 05/15/2018 259  150 - 400 K/uL Final  . nRBC 05/15/2018 0.0  0.0 - 0.2 % Final  . Neutrophils Relative % 05/15/2018 67  % Final  . Neutro Abs 05/15/2018 3.8  1.7 - 7.7 K/uL Final  . Lymphocytes Relative 05/15/2018 25  % Final  . Lymphs Abs 05/15/2018 1.4  0.7 - 4.0 K/uL Final  . Monocytes Relative 05/15/2018 7  % Final  . Monocytes Absolute 05/15/2018 0.4  0.1 - 1.0 K/uL Final  . Eosinophils Relative 05/15/2018 1  % Final  . Eosinophils Absolute 05/15/2018 0.1  0.0 - 0.5 K/uL Final  . Basophils  Relative 05/15/2018 0  % Final  . Basophils Absolute 05/15/2018 0.0  0.0 - 0.1 K/uL Final  . Immature Granulocytes 05/15/2018 0  % Final  .  Abs Immature Granulocytes 05/15/2018 0.01  0.00 - 0.07 K/uL Final   Performed at [facility], [place] [address] [place], [place] [address]  . Sodium 05/15/2018 142  135 - 145 mmol/L Final  . Potassium 05/15/2018 4.0  3.5 - 5.1 mmol/L Final  . Chloride 05/15/2018 106  98 - 111 mmol/L Final  . CO2 05/15/2018 27  22 - 32 mmol/L Final  . Glucose, Bld 05/15/2018 95  70 - 99 mg/dL Final  . BUN 05/15/2018 7  6 - 20 mg/dL Final  . Creatinine 05/15/2018 0.76  0.44 - 1.00 mg/dL Final  . Calcium 05/15/2018 10.0  8.9 - 10.3 mg/dL Final  . Total Protein 05/15/2018 7.2  6.5 - 8.1 g/dL Final  . Albumin 05/15/2018 4.4  3.5 - 5.0 g/dL Final  . AST 05/15/2018 10* 15 - 41 U/L Final  . ALT 05/15/2018 9  0 - 44 U/L Final  . Alkaline Phosphatase 05/15/2018 60  38 - 126 U/L Final  . Total Bilirubin 05/15/2018 1.1  0.3 - 1.2 mg/dL Final  . GFR, Est Non Af Am 05/15/2018 >60  >60 mL/min Final  . GFR, Est AFR Am 05/15/2018 >60  >60 mL/min Final  . Anion gap 05/15/2018 9  5 - 15 Final   Performed at [facility], [place] [address] [place], [place] [address]    (this displays the last labs from the last 3 days)  No results found for: TOTALPROTELP, ALBUMINELP, A1GS, A2GS, BETS, BETA2SER, GAMS, MSPIKE, SPEI (this displays SPEP labs)  No results found for: KPAFRELGTCHN, LAMBDASER, KAPLAMBRATIO (kappa/lambda light chains)  No results found for: HGBA, HGBA2QUANT, HGBFQUANT, HGBSQUAN (Hemoglobinopathy evaluation)   No results found for: LDH  Lab Results  Component Value Date   IRON 59 12/27/2016   TIBC 329 12/27/2016   IRONPCTSAT 18 12/27/2016   (Iron and TIBC)  Lab Results  Component Value Date   FERRITIN 24 01/15/2018    Urinalysis    Component Value Date/Time   COLORURINE YELLOW 12/18/2016 [place] 12/18/2016 1607   LABSPEC 1.010 12/18/2016 1607   PHURINE 7.0 12/18/2016 1607   GLUCOSEU NEGATIVE 12/18/2016 1607   HGBUR NEGATIVE 12/18/2016 1607   HGBUR negative 01/13/2010 0940   BILIRUBINUR NEGATIVE 12/18/2016 1607   BILIRUBINUR neg 05/17/2015 [place] 12/18/2016 1607   PROTEINUR NEGATIVE 12/18/2016 1607   UROBILINOGEN 0.2 05/17/2015 1034   UROBILINOGEN 0.2 01/13/2010 0940   NITRITE NEGATIVE 12/18/2016 1607   LEUKOCYTESUR NEGATIVE 12/18/2016 1607     STUDIES:  US Breast Ltd Uni Right Inc Axilla  Grant Date: 05/02/2018 CLINICAL DATA:  Possible mass in the posterior aspect of the upper right breast in the oblique projection of a recent screening mammogram. The patient also feels a mass in the upper-outer right breast. She had a benign fibroadenoma in the upper outer quadrant of the right breast on a stereotactic guided biopsy of microcalcifications 2016. She has a 24% calculated lifetime risk of developing breast cancer.Her mother has a history of breast cancer at age 72. EXAM: DIGITAL DIAGNOSTIC RIGHT MAMMOGRAM WITH CAD AND TOMO ULTRASOUND RIGHT BREAST COMPARISON:  Previous exam(s). ACR Breast Density Category c: The breast tissue is heterogeneously dense, which may obscure small masses. FINDINGS: 3D tomographic and 2D generated true lateral, laterally exaggerated craniocaudal, spot compression tangential and spot compression oblique views of the right breast were obtained. These demonstrate an oval, circumscribed mass in the posterior aspect of the upper-outer  right breast, corresponding to the mammographically suspected mass and slightly superior to the palpable mass. At the location of the palpable mass, there is minimal faint increased density without a discrete mass. Mammographic images were processed with CAD. On physical exam, the patient has an approximately 3.5 x 2.0 cm elongated, firm, irregular, palpable mass in the upper outer right breast centered in 10 to  11 o'clock position, 5 cm from the nipple. There are no palpable right axillary lymph nodes. Targeted ultrasound is performed, showing a 3.7 x 1.7 x 1.5 cm elongated, irregular, hypoechoic mass in the 10-11 o'clock position of the right breast, 5 cm from the nipple, corresponding to the palpable mass. Ultrasound of the right axilla demonstrated 7 right axillary lymph nodes with abnormal cortical thickening. One of these correlates to the mass initially seen mammographically. IMPRESSION: 1. 3.7 cm mass with imaging features highly suspicious for malignancy centered in the 10:30 o'clock position of the right breast, corresponding to the mass felt by the patient. 2. 7 abnormal right axillary lymph nodes highly suspicious for metastatic nodes. RECOMMENDATION: Ultrasound-guided core needle biopsy of the 3.7 cm mass centered in the 10:30 o'clock position of the right breast, 5 cm from the nipple and ultrasound-guided core needle biopsy of one of the abnormal right axillary lymph nodes. This has been discussed with the patient and the biopsies have been scheduled at 1:45 p.m. on 05/03/2018. I have discussed the findings and recommendations with the patient. Results were also provided in writing at the conclusion of the visit. If applicable, a reminder letter will be sent to the patient regarding the next appointment. BI-RADS CATEGORY  5: Highly suggestive of malignancy. Electronically Signed   By: Claudie Revering M.D.   On: 05/02/2018 12:52   Mm Diag Breast Tomo Uni Right  Grant Date: 05/02/2018 CLINICAL DATA:  Possible mass in the posterior aspect of the upper right breast in the oblique projection of a recent screening mammogram. The patient also feels a mass in the upper-outer right breast. She had a benign fibroadenoma in the upper outer quadrant of the right breast on a stereotactic guided biopsy of microcalcifications 2016. She has a 24% calculated lifetime risk of developing breast cancer.Her mother has a history  of breast cancer at age 42. EXAM: DIGITAL DIAGNOSTIC RIGHT MAMMOGRAM WITH CAD AND TOMO ULTRASOUND RIGHT BREAST COMPARISON:  Previous exam(s). ACR Breast Density Category c: The breast tissue is heterogeneously dense, which may obscure small masses. FINDINGS: 3D tomographic and 2D generated true lateral, laterally exaggerated craniocaudal, spot compression tangential and spot compression oblique views of the right breast were obtained. These demonstrate an oval, circumscribed mass in the posterior aspect of the upper-outer right breast, corresponding to the mammographically suspected mass and slightly superior to the palpable mass. At the location of the palpable mass, there is minimal faint increased density without a discrete mass. Mammographic images were processed with CAD. On physical exam, the patient has an approximately 3.5 x 2.0 cm elongated, firm, irregular, palpable mass in the upper outer right breast centered in 10 to 11 o'clock position, 5 cm from the nipple. There are no palpable right axillary lymph nodes. Targeted ultrasound is performed, showing a 3.7 x 1.7 x 1.5 cm elongated, irregular, hypoechoic mass in the 10-11 o'clock position of the right breast, 5 cm from the nipple, corresponding to the palpable mass. Ultrasound of the right axilla demonstrated 7 right axillary lymph nodes with abnormal cortical thickening. One of these correlates to the mass initially seen  mammographically. IMPRESSION: 1. 3.7 cm mass with imaging features highly suspicious for malignancy centered in the 10:30 o'clock position of the right breast, corresponding to the mass felt by the patient. 2. 7 abnormal right axillary lymph nodes highly suspicious for metastatic nodes. RECOMMENDATION: Ultrasound-guided core needle biopsy of the 3.7 cm mass centered in the 10:30 o'clock position of the right breast, 5 cm from the nipple and ultrasound-guided core needle biopsy of one of the abnormal right axillary lymph nodes. This has  been discussed with the patient and the biopsies have been scheduled at 1:45 p.m. on 05/03/2018. I have discussed the findings and recommendations with the patient. Results were also provided in writing at the conclusion of the visit. If applicable, a reminder letter will be sent to the patient regarding the next appointment. BI-RADS CATEGORY  5: Highly suggestive of malignancy. Electronically Signed   By: Claudie Revering M.D.   On: 05/02/2018 12:52   Mm 3d Screen Breast Bilateral  Grant Date: 04/23/2018 CLINICAL DATA:  Screening. EXAM: DIGITAL SCREENING BILATERAL MAMMOGRAM WITH TOMO AND CAD COMPARISON:  Previous exam(s). ACR Breast Density Category c: The breast tissue is heterogeneously dense, which may obscure small masses. FINDINGS: In the right breast, a possible mass warrants further evaluation. In the left breast, no findings suspicious for malignancy. Images were processed with CAD. IMPRESSION: Further evaluation is suggested for possible mass in the right breast. RECOMMENDATION: Diagnostic mammogram and possibly ultrasound of the right breast. (Code:[id number]) The patient will be contacted regarding the findings, and additional imaging will be scheduled. BI-RADS CATEGORY  0: Incomplete. Need additional imaging evaluation and/or prior mammograms for comparison. Electronically Signed   By: Claudie Revering M.D.   On: 04/23/2018 10:22   [place] Axillary Node Core Biopsy Right  Addendum Date: 05/09/2018   ADDENDUM REPORT: 05/06/2018 16:02 ADDENDUM: Pathology revealed GRADE III INVASIVE MAMMARY CARCINOMA, MAMMARY CARCINOMA IN SITU, LYMPHOVASCULAR INVASION PRESENT of the Right breast, upper outer quadrant, 11 o'clock. 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 This may represent a primary site or completely replaced lymph node. E-cadherin is positive consistent with a ductal phenotype. This was found to be concordant by Dr. Lajean Manes. Pathology results were discussed  with the patient by telephone. The patient reported doing well after the biopsies with tenderness at the sites. Post biopsy instructions and care were reviewed and questions were answered. The patient was encouraged to call [facility] for any additional concerns. The patient was referred to [facility] on May 15, 2018. Recommendation for a bilateral breast MRI due to heterogeneously dense breasts and family history of her mother having breast cancer at age 33. Pathology results reported by Terie Purser, RN on 05/06/2018. Electronically Signed   By: Lajean Manes M.D.   On: 05/06/2018 16:02   Grant Date: 05/09/2018 CLINICAL DATA:  Patient presents for ultrasound-guided core needle biopsy of a right breast mass and an abnormal right axillary lymph node. EXAM: ULTRASOUND GUIDED RIGHT BREAST CORE NEEDLE BIOPSY ULTRASOUND GUIDED RIGHT AXILLARY LYMPH NODE CORE NEEDLE BIOPSY COMPARISON:  Previous exam(s). FINDINGS: I met with the patient and we discussed the procedure of ultrasound-guided biopsy, including benefits and alternatives. We discussed the high likelihood of a successful procedure. We discussed the risks of the procedure, including infection, bleeding, tissue injury, clip migration, and inadequate sampling. Informed written consent was given. The usual time-out protocol was performed immediately  prior to the procedure. Lesion #1 (breast mass) Lesion quadrant: Upper outer quadrant Using sterile technique and 1% Lidocaine as local anesthetic, under direct ultrasound visualization, a 12 gauge spring-loaded device was used to perform biopsy of the 11 o'clock position right breast mass using an inferior approach. At the conclusion of the procedure a ribbon shaped tissue marker clip was deployed into the biopsy cavity. Lesion #2 (axillary lymph node) Lesion location: Right axilla Using sterile technique and  1% Lidocaine as local anesthetic, under direct ultrasound visualization, a 14 gauge spring-loaded device was used to perform biopsy of 1 of the abnormal right axillary lymph nodes using an inferior approach. At the conclusion of the procedure a HydroMARK tissue marker clip was deployed into the biopsy cavity. Follow up 2 view mammogram was performed and dictated separately. IMPRESSION: Ultrasound guided biopsy of an 11 o'clock position upper outer quadrant right breast mass and an abnormal right axillary lymph node. No apparent complications. Electronically Signed: By: Lajean Manes M.D. On: 05/03/2018 14:46   Mm Clip Placement Right  Grant Date: 05/03/2018 CLINICAL DATA:  Status post stereotactic core needle biopsy of a right breast mass and right axillary lymph node. EXAM: DIAGNOSTIC RIGHT MAMMOGRAM POST ULTRASOUND BIOPSY COMPARISON:  Previous exam(s). FINDINGS: Mammographic images were obtained following ultrasound guided biopsy of a right upper outer quadrant breast mass and an abnormal right axillary lymph node. The ribbon shaped biopsy clip lies within the upper-outer quadrant mass. [facility] biopsy clip is outside the mammographic field of view, but was well seen within the lymph node sonographically. There is an X shaped biopsy clip from an older, benign right breast biopsy. IMPRESSION: Well-positioned ribbon shaped biopsy clip following ultrasound-guided core needle biopsy of an 11 o'clock position right breast mass. HydroMARK biopsy clip within the right axillary lymph node is not visualized mammographically. Final Assessment: Post Procedure Mammograms for Marker Placement Electronically Signed   By: Lajean Manes M.D.   On: 05/03/2018 15:07   [place] Rt Breast Bx W Loc Dev 1st Lesion Img Bx Spec US Guide  Addendum Date: 05/09/2018   ADDENDUM REPORT: 05/06/2018 16:02 ADDENDUM: Pathology revealed GRADE III INVASIVE MAMMARY CARCINOMA, MAMMARY CARCINOMA IN SITU, LYMPHOVASCULAR INVASION PRESENT of  the Right breast, upper outer quadrant, 11 o'clock. 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 This may represent a primary site or completely replaced lymph node. E-cadherin is positive consistent with a ductal phenotype. This was found to be concordant by Dr. Lajean Manes. Pathology results were discussed with the patient by telephone. The patient reported doing well after the biopsies with tenderness at the sites. Post biopsy instructions and care were reviewed and questions were answered. The patient was encouraged to call [facility] for any additional concerns. The patient was referred to [facility] on May 15, 2018. Recommendation for a bilateral breast MRI due to heterogeneously dense breasts and family history of her mother having breast cancer at age 69. Pathology results reported by Terie Purser, RN on 05/06/2018. Electronically Signed   By: Lajean Manes M.D.   On: 05/06/2018 16:02   Grant Date: 05/09/2018 CLINICAL DATA:  Patient presents for ultrasound-guided core needle biopsy of a right breast mass and an abnormal right axillary lymph node. EXAM: ULTRASOUND GUIDED RIGHT BREAST CORE NEEDLE BIOPSY ULTRASOUND GUIDED RIGHT AXILLARY LYMPH NODE CORE NEEDLE BIOPSY COMPARISON:  Previous exam(s). FINDINGS: I met with the  patient and we discussed the procedure of ultrasound-guided biopsy, including benefits and alternatives. We discussed the high likelihood of a successful procedure. We discussed the risks of the procedure, including infection, bleeding, tissue injury, clip migration, and inadequate sampling. Informed written consent was given. The usual time-out protocol was performed immediately prior to the procedure. Lesion #1 (breast mass) Lesion quadrant: Upper outer quadrant Using sterile technique and 1% Lidocaine as local anesthetic, under direct  ultrasound visualization, a 12 gauge spring-loaded device was used to perform biopsy of the 11 o'clock position right breast mass using an inferior approach. At the conclusion of the procedure a ribbon shaped tissue marker clip was deployed into the biopsy cavity. Lesion #2 (axillary lymph node) Lesion location: Right axilla Using sterile technique and 1% Lidocaine as local anesthetic, under direct ultrasound visualization, a 14 gauge spring-loaded device was used to perform biopsy of 1 of the abnormal right axillary lymph nodes using an inferior approach. At the conclusion of the procedure a HydroMARK tissue marker clip was deployed into the biopsy cavity. Follow up 2 view mammogram was performed and dictated separately. IMPRESSION: Ultrasound guided biopsy of an 11 o'clock position upper outer quadrant right breast mass and an abnormal right axillary lymph node. No apparent complications. Electronically Signed: By: Lajean Manes M.D. On: 05/03/2018 14:46    ELIGIBLE FOR AVAILABLE RESEARCH PROTOCOL: Upbeat  ASSESSMENT: 42 y.o. [facility] woman, status post breast upper outer quadrant and right axillary lymph node biopsy 05/03/2018 for a clinical T2 N2, stage IIIC invasive ductal carcinoma, triple negative, with an MIB-1 of 75%  (a) staging studies  (1) genetics testing 06/06/2018  (2) neoadjuvant chemotherapy will consist of doxorubicin and cyclophosphamide in dose dense fashion x4 starting 06/03/2018 followed by paclitaxel and carboplatin weekly x12  (3) definitive surgery to follow  (4) adjuvant radiation to follow surgery  PLAN: I spent approximately 60 minutes face to face with Darlene Grant with more than 50% of that time spent in counseling and coordination of care. Specifically we reviewed the biology of the patient's diagnosis and the specifics of her situation. We first reviewed the fact that cancer is not one disease but more than 100 different diseases and that it is important  to keep them separate-- otherwise when friends and relatives discuss their own cancer experiences with Darlene Grant. Similarly we explained that if breast cancer spreads to the bone or liver, the patient would not have bone cancer or liver cancer, but breast cancer in the bone and breast cancer in the liver: one cancer in three places-- not 3 different cancers which otherwise would have to be treated in 3 different ways.  We discussed the difference between local and systemic therapy. In terms of loco-regional treatment, lumpectomy plus radiation is equivalent to mastectomy as far as survival is concerned. However in Darlene Grant's case, given the strong family history and early age, the idea of intensified screening for the next 40 years is not attractive.  I am very comfortable with her decision to proceed to bilateral mastectomies.  She is interested in bilateral reconstruction and will be discussing this with plastics.  We also noted that in terms of sequencing of treatments, whether systemic therapy or surgery is done first does not affect the ultimate outcome.  In her case we recommend starting with chemotherapy because this will give her 20 weeks so she can get her genetics testing done and plan her definitive surgery and reconstruction and also because it we will give her  prognostic information specifically whether or not she achieves a complete pathologic remission.  It also will allow her to participate in the S1418 study if the response to chemotherapy is not as complete as we hope.  We then discussed the rationale for systemic therapy. There is some risk that this cancer may have already spread to other parts of her body.  In stage III cases that risk is high enough that it warrants staging studies and these are being scheduled.  She understands that I am hoping the studies are negative but that will not mean that microscopic disease is not present: By definition occult disease cannot  be demonstrated by lab work or scans.  That is the reason for systemic therapy  Next we went over the options for systemic therapy which are anti-estrogens, anti-HER-2 immunotherapy, and chemotherapy. Darlene Grant does not meet criteria for anti-HER-2 immunotherapy or antiestrogens.  Her only option for systemic therapy is chemotherapy and that is what we recommend  We then discussed the sequence of studies which have led to the current standard of care in patients like her, namely doxorubicin and cyclophosphamide in dose dense fashion x4 followed by weekly paclitaxel with carboplatin.  We briefly discussed the possible toxicity side effects and complications of these agents and she will also come to "chemotherapy school" for further information.  She will be scheduled for an echocardiogram, breast MRI, port placement, CT of the chest and bone scan.  She is also being scheduled for genetics testing.  She will then return to see me before she starts chemotherapy 06/03/2018.  At that visit we will discuss how to take her supportive medications.  Negar has a good understanding of the overall plan. She agrees with it. She knows the goal of treatment in her case is cure. She will call with any problems that may develop before her next visit here.   Magrinat, Virgie Dad, MD  05/15/18 2:58 PM Medical Oncology and Hematology [facility] [address] [place], [place] [address] Tel. [phone]    Fax. [phone]    I, Jacqualyn Posey am acting as a Education administrator for Chauncey Cruel, MD.   I, Lurline Del MD, have reviewed the above documentation for accuracy and completeness, and I agree with the above.

## 2018-05-15 ENCOUNTER — Ambulatory Visit
Admission: RE | Admit: 2018-05-15 | Discharge: 2018-05-15 | Disposition: A | Discharge status: Home / Self Care | Payer: BLUE CROSS/BLUE SHIELD | Source: Ambulatory Visit | Attending: Radiation Oncology | Admitting: Radiation Oncology

## 2018-05-15 ENCOUNTER — Inpatient Hospital Stay: Payer: BLUE CROSS/BLUE SHIELD

## 2018-05-15 ENCOUNTER — Inpatient Hospital Stay: Payer: BLUE CROSS/BLUE SHIELD | Attending: Oncology | Admitting: Oncology

## 2018-05-15 ENCOUNTER — Encounter: Payer: Self-pay | Admitting: Oncology

## 2018-05-15 ENCOUNTER — Telehealth: Payer: Self-pay | Admitting: Oncology

## 2018-05-15 ENCOUNTER — Other Ambulatory Visit: Payer: Self-pay | Admitting: General Surgery

## 2018-05-15 ENCOUNTER — Other Ambulatory Visit: Payer: Self-pay | Admitting: *Deleted

## 2018-05-15 ENCOUNTER — Encounter: Payer: Self-pay | Admitting: *Deleted

## 2018-05-15 VITALS — BP 123/88 | HR 80 | Temp 98.6°F | Resp 18 | Ht 64.25 in | Wt 144.2 lb

## 2018-05-15 DIAGNOSIS — Z171 Estrogen receptor negative status [ER-]: Secondary | ICD-10-CM

## 2018-05-15 DIAGNOSIS — R590 Localized enlarged lymph nodes: Secondary | ICD-10-CM | POA: Diagnosis not present

## 2018-05-15 DIAGNOSIS — C50411 Malignant neoplasm of upper-outer quadrant of right female breast: Secondary | ICD-10-CM

## 2018-05-15 DIAGNOSIS — Z803 Family history of malignant neoplasm of breast: Secondary | ICD-10-CM | POA: Diagnosis not present

## 2018-05-15 LAB — CBC WITH DIFFERENTIAL (CANCER CENTER ONLY)
Abs Immature Granulocytes: 0.01 10*3/uL (ref 0.00–0.07)
BASOS PCT: 0 %
Basophils Absolute: 0 10*3/uL (ref 0.0–0.1)
EOS ABS: 0.1 10*3/uL (ref 0.0–0.5)
Eosinophils Relative: 1 %
HEMATOCRIT: 36.5 % (ref 36.0–46.0)
Hemoglobin: 12.1 g/dL (ref 12.0–15.0)
IMMATURE GRANULOCYTES: 0 %
LYMPHS ABS: 1.4 10*3/uL (ref 0.7–4.0)
Lymphocytes Relative: 25 %
MCH: 30.6 pg (ref 26.0–34.0)
MCHC: 33.2 g/dL (ref 30.0–36.0)
MCV: 92.4 fL (ref 80.0–100.0)
MONO ABS: 0.4 10*3/uL (ref 0.1–1.0)
MONOS PCT: 7 %
Neutro Abs: 3.8 10*3/uL (ref 1.7–7.7)
Neutrophils Relative %: 67 %
Platelet Count: 259 10*3/uL (ref 150–400)
RBC: 3.95 MIL/uL (ref 3.87–5.11)
RDW: 12.6 % (ref 11.5–15.5)
WBC Count: 5.7 10*3/uL (ref 4.0–10.5)
nRBC: 0 % (ref 0.0–0.2)

## 2018-05-15 LAB — CMP (CANCER CENTER ONLY)
ALT: 9 U/L (ref 0–44)
AST: 10 U/L — AB (ref 15–41)
Albumin: 4.4 g/dL (ref 3.5–5.0)
Alkaline Phosphatase: 60 U/L (ref 38–126)
Anion gap: 9 (ref 5–15)
BUN: 7 mg/dL (ref 6–20)
CO2: 27 mmol/L (ref 22–32)
Calcium: 10 mg/dL (ref 8.9–10.3)
Chloride: 106 mmol/L (ref 98–111)
Creatinine: 0.76 mg/dL (ref 0.44–1.00)
Glucose, Bld: 95 mg/dL (ref 70–99)
POTASSIUM: 4 mmol/L (ref 3.5–5.1)
Sodium: 142 mmol/L (ref 135–145)
TOTAL PROTEIN: 7.2 g/dL (ref 6.5–8.1)
Total Bilirubin: 1.1 mg/dL (ref 0.3–1.2)

## 2018-05-15 MED ORDER — LIDOCAINE-PRILOCAINE 2.5-2.5 % EX CREA: TOPICAL_CREAM | CUTANEOUS | 3 refills | Status: DC

## 2018-05-15 MED ORDER — LORAZEPAM 0.5 MG PO TABS: 0.5000 mg | ORAL_TABLET | Freq: Every evening | ORAL | 0 refills | Status: DC | PRN

## 2018-05-15 MED ORDER — LORATADINE 10 MG PO TABS: 10.0000 mg | ORAL_TABLET | Freq: Every day | ORAL | 0 refills | Status: DC

## 2018-05-15 NOTE — Progress Notes (Signed)
Radiation Oncology         (336) [phone] ________________________________  Initial Outpatient Consultation  Name: Darlene Grant MRN: [id number]  Date: 05/15/2018  DOB: February 15, 1976  QJ:[id number], Angelyn Punt, MD   REFERRING PHYSICIAN: Rolm Bookbinder, MD  DIAGNOSIS: 42 year-old Grant with invasive mammary carcinoma of the right breast, triple negative.  Cancer Staging Malignant neoplasm of upper-outer quadrant of right breast in [place], estrogen receptor negative ([place]) Staging form: Breast, AJCC 8th Edition - Clinical stage from 05/15/2018: Stage IIIB (cT2, cN1, cM0, G3, ER-, PR-, HER2-) - Unsigned   The encounter diagnosis was Malignant neoplasm of upper-outer quadrant of right breast in Grant, estrogen receptor negative ([place]).  HISTORY OF PRESENT ILLNESS::Darlene Grant is a 42 y.o. [place] who presented for routine screening mammogram on 04/19/2018 which showed possible mass in the right breast. Diagnostic mammogram and breast ultrasound performed on 05/02/2018. These scans showed a 3.7 cm mass with imaging features highly suspicious for malignancy centered in the 10:30 o'clock position of the right breast, corresponding to the mass felt by the patient. Also seen were seven abnormal axillary lymph nodes highly suspicious for metastatic nodes.   Biopsy of the right breast upper outer quadrant 12 o'clock position revealed invasive mammary carcinoma; triple negative, Ki-67 75%. E-cadherin is positive consistent with a ductal phenotype. The carcinoma appears Grade III. 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 Biopsy of the right axillary lymph node revealed invasive mammary carcinoma. The carcinoma is similar to part one, but no lymph node tissue. This may represent a primary site or completely replaced lymph node.  Mother diagnosed with breast cancer at 82 years-old.  PREVIOUS RADIATION THERAPY:  No  PAST MEDICAL HISTORY:  has a past medical history of Abnormal Pap smear, Anemia, Breast pain, left, Candida vaginitis, Rectal itching, and Vaginal Pap smear, abnormal.    PAST SURGICAL HISTORY: Past Surgical History:  Procedure Laterality Date  . BREAST BIOPSY Right 8/16  . CESAREAN SECTION N/A 6/Darlene/2015   Procedure: CESAREAN SECTION;  Surgeon: Alwyn Pea, MD;  Location: [facility];  Service: Obstetrics;  Laterality: N/A;  . CESAREAN SECTION N/A 1/Darlene/2018   Procedure: CESAREAN SECTION;  Surgeon: Emily Filbert, MD;  Location: [facility];  Service: Obstetrics;  Laterality: N/A;  . HAND SURGERY  04/2012   ligament repair  . PILONIDAL CYST EXCISION  2000  . WISDOM TOOTH EXTRACTION  AGE 33    FAMILY HISTORY: family history includes Breast cancer in her mother; Cancer in her maternal grandfather and maternal grandmother; Cancer (age of onset: 31) in her mother.  SOCIAL HISTORY:  reports that she has never smoked. She has never used smokeless tobacco. She reports that she drinks alcohol. She reports that she does not use drugs.  ALLERGIES: Augmentin [amoxicillin-pot clavulanate]; Hydrocodone; and Keflex [cephalexin]  MEDICATIONS:  Current Outpatient Medications  Medication Sig Dispense Refill  . azithromycin (ZITHROMAX) 250 MG tablet Take 2 tablets now and then one tablet for 4 days. 6 tablet Darlene Grant  . AZO-CRANBERRY PO Take by mouth.    . Black Elderberry 50 MG/5ML SYRP Take by mouth.    . cyclobenzaprine (FLEXERIL) 10 MG tablet Take Darlene Grant.Darlene-1 tablets (Darlene-10 mg total) by mouth 3 (three) times daily as needed for muscle spasms. 30 tablet 1  . hydrocortisone 2.Darlene % cream Apply topically 2 (two) times daily. For up to 7 days at a time. (Patient not taking: Reported on 05/15/2018) 30 g 1  .  lidocaine-prilocaine (EMLA) cream Apply to affected area once 30 g 3  . loratadine (CLARITIN) 10 MG tablet Take 1 tablet (10 mg total) by mouth daily. 90 tablet Darlene Grant  . LORazepam (ATIVAN) Darlene Grant.Darlene MG tablet Take 1  tablet (Darlene Grant.Darlene mg total) by mouth at bedtime as needed (Nausea or vomiting). 30 tablet Darlene Grant  . nitrofurantoin, macrocrystal-monohydrate, (MACROBID) 100 MG capsule Take 1 capsule (100 mg total) by mouth 2 (two) times daily. 1 po BId 10 capsule Darlene Grant  . Probiotic Product (PROBIOTIC DAILY PO) Take 1 tablet by mouth.     No current facility-administered medications for this encounter.    Gynecologic History  Age at first menstrual period? 15?  Are you still having periods? Yes Approximate date of last period? 04/30/2018  If you are still having periods: Are your periods regular? Yes  If you no longer have periods: Have you used hormone replacement? N/A  If YES, for how long? N/A When did you stop? N/A Obstetric History:  How many children have you carried to term? 2 Your age at first live birth? 38  Pregnant now or trying to get pregnant? No  Have you used birth control pills or hormone shots for contraception? No  If so, for how long (or approximate dates)? N/A  Would you be interested in learning more about the options to preserve fertility? No Health Maintenance:  Have you ever had a colonoscopy? No If yes, date? N/A  Have you ever had a bone density? No If yes, date? N/A  Date of your last PAP smear? 12/2016 and Pelvic exam 12/2017 Date of your FIRST mammogram? 2008?   REVIEW OF SYSTEMS:  REVIEW OF SYSTEMS: A 10+ POINT REVIEW OF SYSTEMS WAS OBTAINED including neurology, dermatology, psychiatry, cardiac, respiratory, lymph, extremities, GI, GU, musculoskeletal, constitutional, reproductive, HEENT. All pertinent positives are noted in the HPI. All others are negative. On review of systems, the patient reports that she is doing well overall. She denies any chest pain, shortness of breath, cough, fevers, chills, night sweats, unintended weight changes. She denies any bowel or bladder disturbances, and denies abdominal pain, nausea or vomiting. She has a history of UTIs. She reports right breast soreness  since biopsy. A complete review of systems is obtained and is otherwise negative.   PHYSICAL EXAM:  Vitals with BMI 05/15/2018  Height Darlene' 4.25"  Weight 144 lbs 3 oz  BMI 08.67  Systolic 619  Diastolic 88  Pulse 80  Respirations 18   Lungs are clear to auscultation bilaterally. Heart has regular rate and rhythm. No palpable cervical, supraclavicular, or axillary adenopathy. Abdomen soft, non-tender, normal bowel sounds. Breast: Left breast shows no palpable mass, nipple discharge or bleeding. Right breast has bruising in the upper outer quadrant and a palpable area of induration measuring 3.Darlene x 4 cm. As well as a 1 cm to 1.Darlene cm palpable lymph node in the right axilla.    ECOG = Darlene Grant  Darlene Grant - Asymptomatic (Fully active, able to carry on all predisease activities without restriction)  1 - Symptomatic but completely ambulatory (Restricted in physically strenuous activity but ambulatory and able to carry out work of a light or sedentary nature. For example, light housework, office work)  2 - Symptomatic, <50% in bed during the day (Ambulatory and capable of all self care but unable to carry out any work activities. Up and about more than 50% of waking hours)  3 - Symptomatic, >50% in bed, but not bedbound (Capable of only limited self-care, confined  to bed or chair 50% or more of waking hours)  4 - Bedbound (Completely disabled. Cannot carry on any self-care. Totally confined to bed or chair)  Darlene - Death   Eustace Pen MM, Creech RH, Tormey DC, et al. [phone]). "Toxicity and response criteria of the [facility]". [place] [facility] Darlene (6): [phone]  LABORATORY DATA:  Lab Results  Component Value Date   WBC Darlene.7 05/15/2018   HGB 12.1 05/15/2018   HCT 36.Darlene 05/15/2018   MCV 92.4 05/15/2018   PLT 259 05/15/2018   NEUTROABS 3.8 05/15/2018   Lab Results  Component Value Date   NA 142 05/15/2018   K 4.Darlene Grant 05/15/2018   CL 106 05/15/2018   CO2 27 05/15/2018   GLUCOSE 95 05/15/2018    CREATININE Darlene Grant.76 05/15/2018   CALCIUM 10.Darlene Grant 05/15/2018      RADIOGRAPHY: US Breast Ltd Uni Right Inc Axilla  Result Date: 05/02/2018 CLINICAL DATA:  Possible mass in the posterior aspect of the upper right breast in the oblique projection of a recent screening mammogram. The patient also feels a mass in the upper-outer right breast. She had a benign fibroadenoma in the upper outer quadrant of the right breast on a stereotactic guided biopsy of microcalcifications 2016. She has a 24% calculated lifetime risk of developing breast cancer.Her mother has a history of breast cancer at age 7. EXAM: DIGITAL DIAGNOSTIC RIGHT MAMMOGRAM WITH CAD AND TOMO ULTRASOUND RIGHT BREAST COMPARISON:  Previous exam(s). ACR Breast Density Category c: The breast tissue is heterogeneously dense, which may obscure small masses. FINDINGS: 3D tomographic and 2D generated true lateral, laterally exaggerated craniocaudal, spot compression tangential and spot compression oblique views of the right breast were obtained. These demonstrate an oval, circumscribed mass in the posterior aspect of the upper-outer right breast, corresponding to the mammographically suspected mass and slightly superior to the palpable mass. At the location of the palpable mass, there is minimal faint increased density without a discrete mass. Mammographic images were processed with CAD. On physical exam, the patient has an approximately 3.Darlene x 2.Darlene Grant cm elongated, firm, irregular, palpable mass in the upper outer right breast centered in 10 to 11 o'clock position, Darlene cm from the nipple. There are no palpable right axillary lymph nodes. Targeted ultrasound is performed, showing a 3.7 x 1.7 x 1.Darlene cm elongated, irregular, hypoechoic mass in the 10-11 o'clock position of the right breast, Darlene cm from the nipple, corresponding to the palpable mass. Ultrasound of the right axilla demonstrated 7 right axillary lymph nodes with abnormal cortical thickening. One of these  correlates to the mass initially seen mammographically. IMPRESSION: 1. 3.7 cm mass with imaging features highly suspicious for malignancy centered in the 10:30 o'clock position of the right breast, corresponding to the mass felt by the patient. 2. 7 abnormal right axillary lymph nodes highly suspicious for metastatic nodes. RECOMMENDATION: Ultrasound-guided core needle biopsy of the 3.7 cm mass centered in the 10:30 o'clock position of the right breast, Darlene cm from the nipple and ultrasound-guided core needle biopsy of one of the abnormal right axillary lymph nodes. This has been discussed with the patient and the biopsies have been scheduled at 1:45 p.m. on 05/03/2018. I have discussed the findings and recommendations with the patient. Results were also provided in writing at the conclusion of the visit. If applicable, a reminder letter will be sent to the patient regarding the next appointment. BI-RADS CATEGORY  Darlene: Highly suggestive of malignancy. Electronically Signed   By: Remo Lipps  Joneen Caraway M.D.   On: 05/02/2018 12:52   Mm Diag Breast Tomo Uni Right  Result Date: 05/02/2018 CLINICAL DATA:  Possible mass in the posterior aspect of the upper right breast in the oblique projection of a recent screening mammogram. The patient also feels a mass in the upper-outer right breast. She had a benign fibroadenoma in the upper outer quadrant of the right breast on a stereotactic guided biopsy of microcalcifications 2016. She has a 24% calculated lifetime risk of developing breast cancer.Her mother has a history of breast cancer at age 53. EXAM: DIGITAL DIAGNOSTIC RIGHT MAMMOGRAM WITH CAD AND TOMO ULTRASOUND RIGHT BREAST COMPARISON:  Previous exam(s). ACR Breast Density Category c: The breast tissue is heterogeneously dense, which may obscure small masses. FINDINGS: 3D tomographic and 2D generated true lateral, laterally exaggerated craniocaudal, spot compression tangential and spot compression oblique views of the right breast  were obtained. These demonstrate an oval, circumscribed mass in the posterior aspect of the upper-outer right breast, corresponding to the mammographically suspected mass and slightly superior to the palpable mass. At the location of the palpable mass, there is minimal faint increased density without a discrete mass. Mammographic images were processed with CAD. On physical exam, the patient has an approximately 3.Darlene x 2.Darlene Grant cm elongated, firm, irregular, palpable mass in the upper outer right breast centered in 10 to 11 o'clock position, Darlene cm from the nipple. There are no palpable right axillary lymph nodes. Targeted ultrasound is performed, showing a 3.7 x 1.7 x 1.Darlene cm elongated, irregular, hypoechoic mass in the 10-11 o'clock position of the right breast, Darlene cm from the nipple, corresponding to the palpable mass. Ultrasound of the right axilla demonstrated 7 right axillary lymph nodes with abnormal cortical thickening. One of these correlates to the mass initially seen mammographically. IMPRESSION: 1. 3.7 cm mass with imaging features highly suspicious for malignancy centered in the 10:30 o'clock position of the right breast, corresponding to the mass felt by the patient. 2. 7 abnormal right axillary lymph nodes highly suspicious for metastatic nodes. RECOMMENDATION: Ultrasound-guided core needle biopsy of the 3.7 cm mass centered in the 10:30 o'clock position of the right breast, Darlene cm from the nipple and ultrasound-guided core needle biopsy of one of the abnormal right axillary lymph nodes. This has been discussed with the patient and the biopsies have been scheduled at 1:45 p.m. on 05/03/2018. I have discussed the findings and recommendations with the patient. Results were also provided in writing at the conclusion of the visit. If applicable, a reminder letter will be sent to the patient regarding the next appointment. BI-RADS CATEGORY  Darlene: Highly suggestive of malignancy. Electronically Signed   By: Claudie Revering  M.D.   On: 05/02/2018 12:52   Mm 3d Screen Breast Bilateral  Result Date: 11/Darlene/2019 CLINICAL DATA:  Screening. EXAM: DIGITAL SCREENING BILATERAL MAMMOGRAM WITH TOMO AND CAD COMPARISON:  Previous exam(s). ACR Breast Density Category c: The breast tissue is heterogeneously dense, which may obscure small masses. FINDINGS: In the right breast, a possible mass warrants further evaluation. In the left breast, no findings suspicious for malignancy. Images were processed with CAD. IMPRESSION: Further evaluation is suggested for possible mass in the right breast. RECOMMENDATION: Diagnostic mammogram and possibly ultrasound of the right breast. (Code:[id number]-R-[id number]) The patient will be contacted regarding the findings, and additional imaging will be scheduled. BI-RADS CATEGORY  Darlene Grant: Incomplete. Need additional imaging evaluation and/or prior mammograms for comparison. Electronically Signed   By: Claudie Revering M.D.   On: 04/23/2018  10:22   [place] Axillary Node Core Biopsy Right  Addendum Date: 05/09/2018   ADDENDUM REPORT: 05/06/2018 16:02 ADDENDUM: Pathology revealed GRADE III INVASIVE MAMMARY CARCINOMA, MAMMARY CARCINOMA IN SITU, LYMPHOVASCULAR INVASION PRESENT of the Right breast, upper outer quadrant, 11 o'clock. 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 This may represent a primary site or completely replaced lymph node. E-cadherin is positive consistent with a ductal phenotype. This was found to be concordant by Dr. Lajean Manes. Pathology results were discussed with the patient by telephone. The patient reported doing well after the biopsies with tenderness at the sites. Post biopsy instructions and care were reviewed and questions were answered. The patient was encouraged to call [facility] for any additional concerns. The patient was referred to [facility] on May 15, 2018. Recommendation for a bilateral breast MRI due to heterogeneously dense breasts and family history of her mother having breast cancer at age 28. Pathology results reported by Terie Purser, RN on 05/06/2018. Electronically Signed   By: Lajean Manes M.D.   On: 05/06/2018 16:02   Result Date: 05/09/2018 CLINICAL DATA:  Patient presents for ultrasound-guided core needle biopsy of a right breast mass and an abnormal right axillary lymph node. EXAM: ULTRASOUND GUIDED RIGHT BREAST CORE NEEDLE BIOPSY ULTRASOUND GUIDED RIGHT AXILLARY LYMPH NODE CORE NEEDLE BIOPSY COMPARISON:  Previous exam(s). FINDINGS: I met with the patient and we discussed the procedure of ultrasound-guided biopsy, including benefits and alternatives. We discussed the high likelihood of a successful procedure. We discussed the risks of the procedure, including infection, bleeding, tissue injury, clip migration, and inadequate sampling. Informed written consent was given. The usual time-out protocol was performed immediately prior to the procedure. Lesion #1 (breast mass) Lesion quadrant: Upper outer quadrant Using sterile technique and 1% Lidocaine as local anesthetic, under direct ultrasound visualization, a 12 gauge spring-loaded device was used to perform biopsy of the 11 o'clock position right breast mass using an inferior approach. At the conclusion of the procedure a ribbon shaped tissue marker clip was deployed into the biopsy cavity. Lesion #2 (axillary lymph node) Lesion location: Right axilla Using sterile technique and 1% Lidocaine as local anesthetic, under direct ultrasound visualization, a 14 gauge spring-loaded device was used to perform biopsy of 1 of the abnormal right axillary lymph nodes using an inferior approach. At the conclusion of the procedure a HydroMARK tissue marker clip was deployed into the biopsy cavity. Follow up 2 view mammogram was performed and dictated separately. IMPRESSION: Ultrasound guided biopsy of an 11  o'clock position upper outer quadrant right breast mass and an abnormal right axillary lymph node. No apparent complications. Electronically Signed: By: Lajean Manes M.D. On: 05/03/2018 14:46   Mm Clip Placement Right  Result Date: 05/03/2018 CLINICAL DATA:  Status post stereotactic core needle biopsy of a right breast mass and right axillary lymph node. EXAM: DIAGNOSTIC RIGHT MAMMOGRAM POST ULTRASOUND BIOPSY COMPARISON:  Previous exam(s). FINDINGS: Mammographic images were obtained following ultrasound guided biopsy of a right upper outer quadrant breast mass and an abnormal right axillary lymph node. The ribbon shaped biopsy clip lies within the upper-outer quadrant mass. [facility] biopsy clip is outside the mammographic field of view, but was well seen within the lymph node sonographically. There is an X shaped biopsy clip from an older, benign right breast biopsy. IMPRESSION: Well-positioned ribbon shaped biopsy clip following ultrasound-guided core needle  biopsy of an 11 o'clock position right breast mass. HydroMARK biopsy clip within the right axillary lymph node is not visualized mammographically. Final Assessment: Post Procedure Mammograms for Marker Placement Electronically Signed   By: Lajean Manes M.D.   On: 05/03/2018 15:07   [place] Rt Breast Bx W Loc Dev 1st Lesion Img Bx Spec US Guide  Addendum Date: 05/09/2018   ADDENDUM REPORT: 05/06/2018 16:02 ADDENDUM: Pathology revealed GRADE III INVASIVE MAMMARY CARCINOMA, MAMMARY CARCINOMA IN SITU, LYMPHOVASCULAR INVASION PRESENT of the Right breast, upper outer quadrant, 11 o'clock. 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 This may represent a primary site or completely replaced lymph node. E-cadherin is positive consistent with a ductal phenotype. This was found to be concordant by Dr. Lajean Manes. Pathology results were discussed with the patient by telephone. The patient reported doing well after the  biopsies with tenderness at the sites. Post biopsy instructions and care were reviewed and questions were answered. The patient was encouraged to call [name] for any additional concerns. The patient was referred to [facility] on May 15, 2018. Recommendation for a bilateral breast MRI due to heterogeneously dense breasts and family history of her mother having breast cancer at age 68. Pathology results reported by Terie Purser, RN on 05/06/2018. Electronically Signed   By: Lajean Manes M.D.   On: 05/06/2018 16:02   Result Date: 05/09/2018 CLINICAL DATA:  Patient presents for ultrasound-guided core needle biopsy of a right breast mass and an abnormal right axillary lymph node. EXAM: ULTRASOUND GUIDED RIGHT BREAST CORE NEEDLE BIOPSY ULTRASOUND GUIDED RIGHT AXILLARY LYMPH NODE CORE NEEDLE BIOPSY COMPARISON:  Previous exam(s). FINDINGS: I met with the patient and we discussed the procedure of ultrasound-guided biopsy, including benefits and alternatives. We discussed the high likelihood of a successful procedure. We discussed the risks of the procedure, including infection, bleeding, tissue injury, clip migration, and inadequate sampling. Informed written consent was given. The usual time-out protocol was performed immediately prior to the procedure. Lesion #1 (breast mass) Lesion quadrant: Upper outer quadrant Using sterile technique and 1% Lidocaine as local anesthetic, under direct ultrasound visualization, a 12 gauge spring-loaded device was used to perform biopsy of the 11 o'clock position right breast mass using an inferior approach. At the conclusion of the procedure a ribbon shaped tissue marker clip was deployed into the biopsy cavity. Lesion #2 (axillary lymph node) Lesion location: Right axilla Using sterile technique and 1% Lidocaine as local anesthetic, under direct ultrasound visualization,  a 14 gauge spring-loaded device was used to perform biopsy of 1 of the abnormal right axillary lymph nodes using an inferior approach. At the conclusion of the procedure a HydroMARK tissue marker clip was deployed into the biopsy cavity. Follow up 2 view mammogram was performed and dictated separately. IMPRESSION: Ultrasound guided biopsy of an 11 o'clock position upper outer quadrant right breast mass and an abnormal right axillary lymph node. No apparent complications. Electronically Signed: By: Lajean Manes M.D. On: 05/03/2018 14:46      IMPRESSION: Darlene Grant with invasive mammary carcinoma of the right breast, triple negative. Clinical stage IIIb triple negative right breast cancer.  Cancer Staging Malignant neoplasm of upper-outer quadrant of right breast in [place], estrogen receptor negative ([place]) Staging form: Breast, AJCC 8th Edition - Clinical stage from 05/15/2018: Stage IIIB (cT2, cN1, cM0, G3, ER-, PR-, HER2-) - Unsigned  The  patient has an aggressive breast cancer and will proceed with neo-adjuvant chemotherapy. She has already decided that she would like to have bilateral mastectomies and not even consider breast conservation surgery. Patient would be a candidate for post-mastectomy radiation therapy due to her aggressive node positive disease.   Today, I talked to the patient and family about the findings and work-up thus far.  We discussed the natural history of breast cancer and general treatment, highlighting the role of radiotherapy in the management.  We discussed the available radiation techniques, and focused on the details of logistics and delivery.  We reviewed the anticipated acute and late sequelae associated with radiation in this setting.  The patient was encouraged to ask questions that I answered to the best of my ability.  PLAN:  1. Genetics evaluation 2. Breast MRI 3. Port placement 4. Echocardiogram Darlene. Neo-adjuvant chemotherapy 6. Right modified radical  mastectomy and patient will have a prophylactic mastectomy on the left side. 7. Post-mastectomy radiation therapy directed at the right chest and loco-regional area.        ------------------------------------------------  Blair Promise, PhD, MD  This document serves as a record of services personally performed by Gery Pray, MD. It was created on his behalf by Arlyce Harman, a trained medical scribe. The creation of this record is based on the scribe's personal observations and the provider's statements to them. This document has been checked and approved by the attending provider.

## 2018-05-15 NOTE — Progress Notes (Signed)
START ON PATHWAY REGIMEN - Breast     A cycle is every 14 days (cycles 1-4):     Doxorubicin      Cyclophosphamide      Pegfilgrastim-xxxx    A cycle is every 21 days (cycles 5-8):     Paclitaxel      Carboplatin   **Always confirm dose/schedule in your pharmacy ordering system**  Patient Characteristics: Preoperative or Nonsurgical Candidate (Clinical Staging), Neoadjuvant Therapy followed by Surgery, Invasive Disease, Chemotherapy, HER2 Negative/Unknown/Equivocal, ER Negative/Unknown, Platinum Therapy Indicated Therapeutic Status: Preoperative or Nonsurgical Candidate (Clinical Staging) AJCC M Category: cM0 AJCC Grade: G3 Breast Surgical Plan: Neoadjuvant Therapy followed by Surgery ER Status: Negative (-) AJCC 8 Stage Grouping: IIIC HER2 Status: Negative (-) AJCC T Category: cT2 AJCC N Category: cN2 PR Status: Negative (-) Type of Therapy: Platinum Therapy Indicated Intent of Therapy: Curative Intent, Discussed with Patient 

## 2018-05-15 NOTE — Progress Notes (Signed)
Clinical Social Work [place] Psychosocial Distress Screening [place]  Patient completed distress screening protocol and scored a 8 on the Psychosocial Distress Thermometer which indicates moderate distress. Clinical Social Worker met with patient and patients husband in [facility] to assess for distress and other psychosocial needs. Patient stated she was feeling overwhelmed but felt "better" after meeting with the treatment team and getting more information on her treatment plan. CSW and patient discussed common feeling and emotions when being diagnosed with cancer, and the importance of support during treatment. CSW informed patient of the support team and support services at [facility].  CSw and patient also discussed support resources for her 2 children.  CSW provided contact information and encouraged patient to call with any questions or concerns.  ONCBCN DISTRESS SCREENING 05/15/2018  Screening Type Initial Screening  Distress experienced in past week (1-10) 8  Emotional problem type Nervousness/Anxiety  Information Concerns Type Lack of info about diagnosis;Lack of info about treatment;Lack of info about complementary therapy choices;Lack of info about maintaining fitness  Physical Problem type Sleep/insomnia;Loss of appetitie;Changes in urination  Physician notified of physical symptoms Yes     Johnnye Lana, MSW, LCSW, OSW-C Clinical Social Worker [facility] [phone]

## 2018-05-15 NOTE — Telephone Encounter (Signed)
Called patient to let her know appts were added to her schedule.  Patient is my chart active.  Asked patient to stop by on 12/5 after chemo education to get a copy of her calendar.

## 2018-05-15 NOTE — Progress Notes (Signed)
Nutrition Assessment  Reason for Assessment:  Pt seen in Breast Clinic  ASSESSMENT:   42 year old female with new diagnosis of breast cancer.  Past medical history reviewed  Medications:  reviewed  Labs: reviewed  Anthropometrics:   Height: 64.25 inches Weight: 144 lb 3.2 oz BMI: 24   NUTRITION DIAGNOSIS: Food and nutrition related knowledge deficit related to new diagnosis of breast cancer as evidenced by no prior need for nutrition related information.  INTERVENTION:   Discussed and provided packet of information regarding nutritional tips for breast cancer patients.  Questions answered.  Teachback method used.  Contact information provided and patient knows to contact me with questions/concerns.    MONITORING, EVALUATION, and GOAL: Pt will consume a healthy plant based diet to maintain lean body mass throughout treatment.   Dashanti Burr B. Zenia Resides, [place], [place] Registered Dietitian [phone] (pager)

## 2018-05-15 NOTE — Telephone Encounter (Signed)
Per 11/27 los and GM note.

## 2018-05-17 ENCOUNTER — Telehealth: Payer: Self-pay | Admitting: Adult Health

## 2018-05-17 NOTE — Telephone Encounter (Signed)
Called regarding 12/6

## 2018-05-19 ENCOUNTER — Encounter: Payer: Self-pay | Admitting: Oncology

## 2018-05-21 DIAGNOSIS — Z1231 Encounter for screening mammogram for malignant neoplasm of breast: Secondary | ICD-10-CM | POA: Diagnosis not present

## 2018-05-21 DIAGNOSIS — N6311 Unspecified lump in the right breast, upper outer quadrant: Secondary | ICD-10-CM | POA: Diagnosis not present

## 2018-05-22 DIAGNOSIS — Z171 Estrogen receptor negative status [ER-]: Secondary | ICD-10-CM | POA: Diagnosis not present

## 2018-05-22 DIAGNOSIS — C50811 Malignant neoplasm of overlapping sites of right female breast: Secondary | ICD-10-CM | POA: Diagnosis not present

## 2018-05-22 DIAGNOSIS — C50911 Malignant neoplasm of unspecified site of right female breast: Secondary | ICD-10-CM | POA: Diagnosis not present

## 2018-05-22 DIAGNOSIS — C50919 Malignant neoplasm of unspecified site of unspecified female breast: Secondary | ICD-10-CM | POA: Diagnosis not present

## 2018-05-22 DIAGNOSIS — Z885 Allergy status to narcotic agent status: Secondary | ICD-10-CM | POA: Diagnosis not present

## 2018-05-22 DIAGNOSIS — Z880 Allergy status to penicillin: Secondary | ICD-10-CM | POA: Diagnosis not present

## 2018-05-22 DIAGNOSIS — C773 Secondary and unspecified malignant neoplasm of axilla and upper limb lymph nodes: Secondary | ICD-10-CM | POA: Diagnosis not present

## 2018-05-22 DIAGNOSIS — Z803 Family history of malignant neoplasm of breast: Secondary | ICD-10-CM | POA: Diagnosis not present

## 2018-05-22 DIAGNOSIS — C50411 Malignant neoplasm of upper-outer quadrant of right female breast: Secondary | ICD-10-CM | POA: Diagnosis not present

## 2018-05-22 DIAGNOSIS — Z6824 Body mass index (BMI) 24.0-24.9, adult: Secondary | ICD-10-CM | POA: Diagnosis not present

## 2018-05-22 DIAGNOSIS — Z881 Allergy status to other antibiotic agents status: Secondary | ICD-10-CM | POA: Diagnosis not present

## 2018-05-23 ENCOUNTER — Encounter: Payer: Self-pay | Admitting: Oncology

## 2018-05-23 ENCOUNTER — Other Ambulatory Visit: Payer: BLUE CROSS/BLUE SHIELD

## 2018-05-23 ENCOUNTER — Other Ambulatory Visit: Payer: Self-pay

## 2018-05-23 ENCOUNTER — Other Ambulatory Visit: Payer: Self-pay | Admitting: *Deleted

## 2018-05-23 ENCOUNTER — Ambulatory Visit (HOSPITAL_COMMUNITY)
Admission: RE | Admit: 2018-05-23 | Discharge: 2018-05-23 | Disposition: A | Discharge status: Home / Self Care | Payer: BLUE CROSS/BLUE SHIELD | Source: Ambulatory Visit | Attending: Oncology | Admitting: Oncology

## 2018-05-23 ENCOUNTER — Encounter (HOSPITAL_BASED_OUTPATIENT_CLINIC_OR_DEPARTMENT_OTHER): Payer: Self-pay | Admitting: *Deleted

## 2018-05-23 DIAGNOSIS — Z01810 Encounter for preprocedural cardiovascular examination: Secondary | ICD-10-CM | POA: Diagnosis not present

## 2018-05-23 DIAGNOSIS — C773 Secondary and unspecified malignant neoplasm of axilla and upper limb lymph nodes: Secondary | ICD-10-CM | POA: Diagnosis not present

## 2018-05-23 DIAGNOSIS — C50911 Malignant neoplasm of unspecified site of right female breast: Secondary | ICD-10-CM | POA: Diagnosis not present

## 2018-05-23 DIAGNOSIS — Z171 Estrogen receptor negative status [ER-]: Secondary | ICD-10-CM | POA: Insufficient documentation

## 2018-05-23 DIAGNOSIS — C50411 Malignant neoplasm of upper-outer quadrant of right female breast: Secondary | ICD-10-CM | POA: Diagnosis not present

## 2018-05-23 NOTE — Progress Notes (Signed)
Pre op phone call done for port placement with Dr Donne Hazel.05/28/18.  Pt states that she has trouble with  'feeling angry or crazy' with narcotics in general. States that with her 2nd C Section they used no narcotics and she would like to limit them if possible during surgery.   ERAS explained to pt, she has deferred the Pre surgery drink because she will be having a breat MRI the morning of surgery and requested to drink water instead. Instructed pt to drink up to 8 oz of water that morning, then NPO beginning at 8am. Pt verbalized understanding.

## 2018-05-23 NOTE — Progress Notes (Signed)
2D Echocardiogram has been performed.  Darlene Grant 05/23/2018, 10:47 AM

## 2018-05-24 ENCOUNTER — Telehealth: Payer: Self-pay | Admitting: Oncology

## 2018-05-24 ENCOUNTER — Telehealth: Payer: Self-pay | Admitting: *Deleted

## 2018-05-24 ENCOUNTER — Ambulatory Visit (HOSPITAL_COMMUNITY): Payer: BLUE CROSS/BLUE SHIELD

## 2018-05-24 ENCOUNTER — Inpatient Hospital Stay: Payer: BLUE CROSS/BLUE SHIELD

## 2018-05-24 ENCOUNTER — Other Ambulatory Visit: Payer: Self-pay | Admitting: *Deleted

## 2018-05-24 MED ORDER — PROCHLORPERAZINE MALEATE 10 MG PO TABS: 10.0000 mg | ORAL_TABLET | Freq: Four times a day (QID) | ORAL | 0 refills | Status: DC | PRN

## 2018-05-24 NOTE — Telephone Encounter (Signed)
R/s appt per 12/6 sch message - pt is aware of appt date and time

## 2018-05-24 NOTE — Telephone Encounter (Signed)
Received call from patient this am. She is a new patient of Dr. Jana Hakim. She was to have CT scan today-CAP but is quite nauseated from possible food poisoning. She has not started chemo yet. She is asking for prescription for nausea. She does have new prescription for lorazepam but she is reluctant to take that.  She also needs to reschedule her CT scan. She has re-scheduled her chemo education class until Monday 2 4 pm. VM left for Val, RN with Dr. Jana Hakim requesting compazine for pt. Pt prefers [employer]. They will deliver if called in before 12 noon. Also called [facility] to get pt's CT scans scheduled for next week.  They will call pt to set this up.

## 2018-05-24 NOTE — Telephone Encounter (Signed)
Pt did not show for Education Class this am.  Called mobile # & left message to call back. Pt returned call & states that she thinks she has had food poisoning & has been sick & was unable to come this am.  She reports that she left a message on Dawn/Navigator RN's vm.  She had to cancel CT also.  Informed that we have time to r/s.  She reports having a busy week next week but wants to get education. Message sent to scheduler's to r/s Education Class. Spoke with Dawn to f/u on CT.

## 2018-05-27 ENCOUNTER — Encounter: Payer: Self-pay | Admitting: Oncology

## 2018-05-27 ENCOUNTER — Inpatient Hospital Stay: Payer: BLUE CROSS/BLUE SHIELD | Attending: Oncology

## 2018-05-27 DIAGNOSIS — R51 Headache: Secondary | ICD-10-CM | POA: Insufficient documentation

## 2018-05-27 DIAGNOSIS — C773 Secondary and unspecified malignant neoplasm of axilla and upper limb lymph nodes: Secondary | ICD-10-CM | POA: Insufficient documentation

## 2018-05-27 DIAGNOSIS — Z452 Encounter for adjustment and management of vascular access device: Secondary | ICD-10-CM | POA: Insufficient documentation

## 2018-05-27 DIAGNOSIS — Z5111 Encounter for antineoplastic chemotherapy: Secondary | ICD-10-CM | POA: Insufficient documentation

## 2018-05-27 DIAGNOSIS — Z5189 Encounter for other specified aftercare: Secondary | ICD-10-CM | POA: Insufficient documentation

## 2018-05-27 DIAGNOSIS — C50411 Malignant neoplasm of upper-outer quadrant of right female breast: Secondary | ICD-10-CM | POA: Insufficient documentation

## 2018-05-27 DIAGNOSIS — Z171 Estrogen receptor negative status [ER-]: Secondary | ICD-10-CM | POA: Insufficient documentation

## 2018-05-27 NOTE — Progress Notes (Signed)
Met with patient after chemo ed class to introduce myself as Arboriculturist and to offer available resources.  Advised patient of copay assistance program available for Udenyca via [employer]. Patient interested in enrolling. Advised patient I will enroll her online and to advise me if plan changes to Neulasta at any point. Patient verbalized understanding. Advised Dee w /authorizations that [employer] was listed. She sent message to provider to notify it needed to be changed based on insurance guidelines. Spoke with patient briefly regarding authorization process.  Discussed one-time $1000 Radio broadcast assistant. Based on household size and income, patient states they are over the income but was very appreciative for the offer.  Gave her my card for any additional financial questions or concerns.

## 2018-05-28 ENCOUNTER — Other Ambulatory Visit: Payer: Self-pay

## 2018-05-28 ENCOUNTER — Ambulatory Visit (HOSPITAL_BASED_OUTPATIENT_CLINIC_OR_DEPARTMENT_OTHER): Payer: BLUE CROSS/BLUE SHIELD | Admitting: Anesthesiology

## 2018-05-28 ENCOUNTER — Ambulatory Visit (HOSPITAL_COMMUNITY): Payer: BLUE CROSS/BLUE SHIELD

## 2018-05-28 ENCOUNTER — Encounter (HOSPITAL_BASED_OUTPATIENT_CLINIC_OR_DEPARTMENT_OTHER)
Admission: RE | Disposition: A | Discharge status: Home / Self Care | Payer: Self-pay | Source: Home / Self Care | Attending: General Surgery

## 2018-05-28 ENCOUNTER — Encounter (HOSPITAL_BASED_OUTPATIENT_CLINIC_OR_DEPARTMENT_OTHER): Payer: Self-pay | Admitting: *Deleted

## 2018-05-28 ENCOUNTER — Encounter: Payer: Self-pay | Admitting: Oncology

## 2018-05-28 ENCOUNTER — Ambulatory Visit (HOSPITAL_BASED_OUTPATIENT_CLINIC_OR_DEPARTMENT_OTHER)
Admission: RE | Admit: 2018-05-28 | Discharge: 2018-05-28 | Disposition: A | Discharge status: Home / Self Care | Payer: BLUE CROSS/BLUE SHIELD | Attending: General Surgery | Admitting: General Surgery

## 2018-05-28 ENCOUNTER — Ambulatory Visit
Admission: RE | Admit: 2018-05-28 | Discharge: 2018-05-28 | Disposition: A | Discharge status: Home / Self Care | Payer: BLUE CROSS/BLUE SHIELD | Source: Ambulatory Visit | Attending: Oncology | Admitting: Oncology

## 2018-05-28 DIAGNOSIS — C50911 Malignant neoplasm of unspecified site of right female breast: Secondary | ICD-10-CM | POA: Diagnosis not present

## 2018-05-28 DIAGNOSIS — Z4682 Encounter for fitting and adjustment of non-vascular catheter: Secondary | ICD-10-CM | POA: Diagnosis not present

## 2018-05-28 DIAGNOSIS — Z803 Family history of malignant neoplasm of breast: Secondary | ICD-10-CM | POA: Diagnosis not present

## 2018-05-28 DIAGNOSIS — Z452 Encounter for adjustment and management of vascular access device: Secondary | ICD-10-CM | POA: Diagnosis not present

## 2018-05-28 DIAGNOSIS — C50411 Malignant neoplasm of upper-outer quadrant of right female breast: Secondary | ICD-10-CM

## 2018-05-28 DIAGNOSIS — Z171 Estrogen receptor negative status [ER-]: Secondary | ICD-10-CM

## 2018-05-28 DIAGNOSIS — Z419 Encounter for procedure for purposes other than remedying health state, unspecified: Secondary | ICD-10-CM

## 2018-05-28 DIAGNOSIS — C773 Secondary and unspecified malignant neoplasm of axilla and upper limb lymph nodes: Secondary | ICD-10-CM | POA: Diagnosis not present

## 2018-05-28 HISTORY — DX: Other complications of anesthesia, initial encounter: T88.59XA

## 2018-05-28 HISTORY — PX: PORTACATH PLACEMENT: SHX2246

## 2018-05-28 HISTORY — DX: Malignant (primary) neoplasm, unspecified: C80.1

## 2018-05-28 HISTORY — DX: Adverse effect of unspecified anesthetic, initial encounter: T41.45XA

## 2018-05-28 SURGERY — INSERTION, TUNNELED CENTRAL VENOUS DEVICE, WITH PORT
Anesthesia: General

## 2018-05-28 MED ORDER — CIPROFLOXACIN IN D5W 400 MG/200ML IV SOLN
INTRAVENOUS | Status: AC
  Filled 2018-05-28: qty 200

## 2018-05-28 MED ORDER — ENSURE PRE-SURGERY PO LIQD: 296.0000 mL | Freq: Once | ORAL | Status: DC

## 2018-05-28 MED ORDER — HEPARIN SOD (PORK) LOCK FLUSH 100 UNIT/ML IV SOLN
INTRAVENOUS | Status: DC | PRN
  Administered 2018-05-28: 500 [IU] via INTRAVENOUS

## 2018-05-28 MED ORDER — LIDOCAINE 2% (20 MG/ML) 5 ML SYRINGE
INTRAMUSCULAR | Status: AC
  Filled 2018-05-28: qty 15

## 2018-05-28 MED ORDER — FENTANYL CITRATE (PF) 100 MCG/2ML IJ SOLN: 50.0000 ug | INTRAMUSCULAR | Status: DC | PRN

## 2018-05-28 MED ORDER — MIDAZOLAM HCL 2 MG/2ML IJ SOLN
INTRAMUSCULAR | Status: AC
  Filled 2018-05-28: qty 2

## 2018-05-28 MED ORDER — KETOROLAC TROMETHAMINE 30 MG/ML IJ SOLN
INTRAMUSCULAR | Status: DC | PRN
  Administered 2018-05-28: 30 mg via INTRAVENOUS

## 2018-05-28 MED ORDER — ONDANSETRON HCL 4 MG/2ML IJ SOLN
INTRAMUSCULAR | Status: DC | PRN
  Administered 2018-05-28: 4 mg via INTRAVENOUS

## 2018-05-28 MED ORDER — TRAMADOL HCL 50 MG PO TABS: 50.0000 mg | ORAL_TABLET | Freq: Four times a day (QID) | ORAL | 0 refills | Status: DC | PRN

## 2018-05-28 MED ORDER — PROMETHAZINE HCL 25 MG/ML IJ SOLN: 6.2500 mg | INTRAMUSCULAR | Status: DC | PRN

## 2018-05-28 MED ORDER — ACETAMINOPHEN 500 MG PO TABS
ORAL_TABLET | ORAL | Status: AC
  Filled 2018-05-28: qty 2

## 2018-05-28 MED ORDER — LIDOCAINE HCL (CARDIAC) PF 100 MG/5ML IV SOSY
PREFILLED_SYRINGE | INTRAVENOUS | Status: DC | PRN
  Administered 2018-05-28: 60 mg via INTRAVENOUS

## 2018-05-28 MED ORDER — BUPIVACAINE HCL (PF) 0.25 % IJ SOLN
INTRAMUSCULAR | Status: DC | PRN
  Administered 2018-05-28: 6.5 mL

## 2018-05-28 MED ORDER — SCOPOLAMINE 1 MG/3DAYS TD PT72: 1.0000 | MEDICATED_PATCH | Freq: Once | TRANSDERMAL | Status: DC | PRN

## 2018-05-28 MED ORDER — LIDOCAINE 2% (20 MG/ML) 5 ML SYRINGE
INTRAMUSCULAR | Status: AC
  Filled 2018-05-28: qty 5

## 2018-05-28 MED ORDER — GADOBUTROL 1 MMOL/ML IV SOLN
7.0000 mL | Freq: Once | INTRAVENOUS | Status: AC | PRN
  Administered 2018-05-28: 7 mL via INTRAVENOUS

## 2018-05-28 MED ORDER — GABAPENTIN 100 MG PO CAPS
100.0000 mg | ORAL_CAPSULE | ORAL | Status: AC
  Administered 2018-05-28: 100 mg via ORAL

## 2018-05-28 MED ORDER — PROPOFOL 10 MG/ML IV BOLUS
INTRAVENOUS | Status: DC | PRN
  Administered 2018-05-28: 200 mg via INTRAVENOUS

## 2018-05-28 MED ORDER — HEPARIN (PORCINE) IN NACL 2-0.9 UNITS/ML
INTRAMUSCULAR | Status: AC | PRN
  Administered 2018-05-28: 6 mL via INTRAVENOUS

## 2018-05-28 MED ORDER — CIPROFLOXACIN IN D5W 400 MG/200ML IV SOLN
400.0000 mg | INTRAVENOUS | Status: AC
  Administered 2018-05-28 (×2): 400 mg via INTRAVENOUS

## 2018-05-28 MED ORDER — DEXAMETHASONE SODIUM PHOSPHATE 4 MG/ML IJ SOLN
INTRAMUSCULAR | Status: DC | PRN
  Administered 2018-05-28: 10 mg via INTRAVENOUS

## 2018-05-28 MED ORDER — ACETAMINOPHEN 500 MG PO TABS
1000.0000 mg | ORAL_TABLET | ORAL | Status: AC
  Administered 2018-05-28: 1000 mg via ORAL

## 2018-05-28 MED ORDER — GABAPENTIN 100 MG PO CAPS
ORAL_CAPSULE | ORAL | Status: AC
  Filled 2018-05-28: qty 1

## 2018-05-28 MED ORDER — MIDAZOLAM HCL 2 MG/2ML IJ SOLN
1.0000 mg | INTRAMUSCULAR | Status: DC | PRN
  Administered 2018-05-28: 2 mg via INTRAVENOUS

## 2018-05-28 MED ORDER — LACTATED RINGERS IV SOLN
INTRAVENOUS | Status: DC
  Administered 2018-05-28 (×2): via INTRAVENOUS

## 2018-05-28 SURGICAL SUPPLY — 49 items
BAG DECANTER FOR FLEXI CONT (MISCELLANEOUS) ×2 IMPLANT
BENZOIN TINCTURE PRP APPL 2/3 (GAUZE/BANDAGES/DRESSINGS) ×2 IMPLANT
BLADE SURG 11 STRL SS (BLADE) ×2 IMPLANT
BLADE SURG 15 STRL LF DISP TIS (BLADE) ×1 IMPLANT
BLADE SURG 15 STRL SS (BLADE) ×1
CANISTER SUCT 1200ML W/VALVE (MISCELLANEOUS) IMPLANT
CHLORAPREP W/TINT 26ML (MISCELLANEOUS) ×2 IMPLANT
COVER BACK TABLE 60X90IN (DRAPES) ×2 IMPLANT
COVER MAYO STAND STRL (DRAPES) ×2 IMPLANT
COVER PROBE 5X48 (MISCELLANEOUS) ×1
COVER WAND RF STERILE (DRAPES) IMPLANT
DECANTER SPIKE VIAL GLASS SM (MISCELLANEOUS) IMPLANT
DERMABOND ADVANCED (GAUZE/BANDAGES/DRESSINGS) ×1
DERMABOND ADVANCED .7 DNX12 (GAUZE/BANDAGES/DRESSINGS) ×1 IMPLANT
DRAPE C-ARM 42X72 X-RAY (DRAPES) ×2 IMPLANT
DRAPE LAPAROSCOPIC ABDOMINAL (DRAPES) ×2 IMPLANT
DRAPE UTILITY XL STRL (DRAPES) ×2 IMPLANT
DRSG TEGADERM 4X4.75 (GAUZE/BANDAGES/DRESSINGS) ×2 IMPLANT
ELECT COATED BLADE 2.86 ST (ELECTRODE) ×2 IMPLANT
ELECT REM PT RETURN 9FT ADLT (ELECTROSURGICAL) ×2
ELECTRODE REM PT RTRN 9FT ADLT (ELECTROSURGICAL) ×1 IMPLANT
GAUZE SPONGE 4X4 12PLY STRL LF (GAUZE/BANDAGES/DRESSINGS) ×2 IMPLANT
GLOVE BIO SURGEON STRL SZ7 (GLOVE) ×2 IMPLANT
GLOVE BIOGEL PI IND STRL 7.5 (GLOVE) ×1 IMPLANT
GLOVE BIOGEL PI INDICATOR 7.5 (GLOVE) ×1
GOWN STRL REUS W/ TWL LRG LVL3 (GOWN DISPOSABLE) ×2 IMPLANT
GOWN STRL REUS W/TWL LRG LVL3 (GOWN DISPOSABLE) ×2
IV KIT MINILOC 20X1 SAFETY (NEEDLE) IMPLANT
KIT CVR 48X5XPRB PLUP LF (MISCELLANEOUS) ×1 IMPLANT
KIT PORT POWER 8FR ISP CVUE (Port) ×2 IMPLANT
NDL SAFETY ECLIPSE 18X1.5 (NEEDLE) IMPLANT
NEEDLE HYPO 18GX1.5 SHARP (NEEDLE)
NEEDLE HYPO 25X1 1.5 SAFETY (NEEDLE) ×2 IMPLANT
PACK BASIN DAY SURGERY FS (CUSTOM PROCEDURE TRAY) ×2 IMPLANT
PENCIL BUTTON HOLSTER BLD 10FT (ELECTRODE) ×2 IMPLANT
SLEEVE SCD COMPRESS KNEE MED (MISCELLANEOUS) ×2 IMPLANT
STRIP CLOSURE SKIN 1/2X4 (GAUZE/BANDAGES/DRESSINGS) ×2 IMPLANT
SUT MNCRL AB 4-0 PS2 18 (SUTURE) ×2 IMPLANT
SUT PROLENE 2 0 SH DA (SUTURE) ×2 IMPLANT
SUT SILK 2 0 TIES 17X18 (SUTURE)
SUT SILK 2-0 18XBRD TIE BLK (SUTURE) IMPLANT
SUT VIC AB 3-0 SH 27 (SUTURE) ×1
SUT VIC AB 3-0 SH 27X BRD (SUTURE) ×1 IMPLANT
SYR 5ML LUER SLIP (SYRINGE) ×2 IMPLANT
SYR CONTROL 10ML LL (SYRINGE) ×2 IMPLANT
TOWEL GREEN STERILE FF (TOWEL DISPOSABLE) ×2 IMPLANT
TOWEL OR NON WOVEN STRL DISP B (DISPOSABLE) ×2 IMPLANT
TUBE CONNECTING 20X1/4 (TUBING) IMPLANT
YANKAUER SUCT BULB TIP NO VENT (SUCTIONS) IMPLANT

## 2018-05-28 NOTE — Discharge Instructions (Signed)

## 2018-05-28 NOTE — Op Note (Signed)
Preoperative diagnosis:breast cancer Postoperative diagnosis: same as above Procedure: right ij US guided powerport insertion Surgeon: Dr Serita Grammes EBL: minimal Anes: general  Specimensnone Complications none Drains none Sponge count correct Dispo to pacu stable  Indications: This is a51 yof with node positive breast cancer.  We discussed all options and elected to proceed with systemic therapy.She is due to begin chemotherapy. We discussed port placement.  Procedure: After informed consent was obtained the patient was taken to the operating room. She was given antibiotics. Sequential compression devices were on her legs. She was then placed under general anesthesia. Then she was prepped and draped in the standard sterile surgical fashion. Surgical timeout was then performed.  Ithenused the ultrasound to identify the right internal jugular vein. I then accessed the vein using the ultrasound.This aspirated blood. I then placed the wire. This was confirmed by fluoroscopy and ultrasound to be in the correct position.I tunneled the line between the 2 sites.I then dilated the tract and placed the dilator assembly with the sheath. This was done under fluoroscopy. I then removed the sheath and dilator. The wire was also removed. The line was then pulled back to be in the venacava. I hooked this up to the port. I sutured this into place with 2-0 Prolene in 2 places. This aspirated blood and flushed easily.This was confirmed with a final fluoroscopy. I then closed this with 2-0 Vicryl and 4-0 Monocryl.This withdrew blood and I placed heparin in it.Dermabond was placed on both the incisions.She tolerated this well and was transferred to the recovery room in stable condition

## 2018-05-28 NOTE — Anesthesia Procedure Notes (Signed)
Procedure Name: LMA Insertion Date/Time: 05/28/2018 11:53 AM Performed by: Signe Colt, CRNA Pre-anesthesia Checklist: Patient identified, Emergency Drugs available, Suction available and Patient being monitored Patient Re-evaluated:Patient Re-evaluated prior to induction Oxygen Delivery Method: Circle system utilized Preoxygenation: Pre-oxygenation with 100% oxygen Induction Type: IV induction Ventilation: Mask ventilation without difficulty LMA: LMA inserted LMA Size: 4.0 Number of attempts: 1 Airway Equipment and Method: Bite block Placement Confirmation: positive ETCO2 Tube secured with: Tape Dental Injury: Teeth and Oropharynx as per pre-operative assessment

## 2018-05-28 NOTE — Transfer of Care (Signed)
Immediate Anesthesia Transfer of Care Note  Patient: Darlene Grant  Procedure(s) Performed: INSERTION PORT-A-CATH WITH ULTRASOUND (N/A )  Patient Location: PACU  Anesthesia Type:General  Level of Consciousness: drowsy and patient cooperative  Airway & Oxygen Therapy: Patient Spontanous Breathing and Patient connected to face mask oxygen  Post-op Assessment: Report given to RN and Post -op Vital signs reviewed and stable  Post vital signs: Reviewed and stable  Last Vitals:  Vitals Value Taken Time  BP    Temp    Pulse 70 05/28/2018 12:38 PM  Resp 24 05/28/2018 12:38 PM  SpO2 100 % 05/28/2018 12:38 PM  Vitals shown include unvalidated device data.  Last Pain:  Vitals:   05/28/18 1016  TempSrc: Oral  PainSc: 0-No pain         Complications: No apparent anesthesia complications

## 2018-05-28 NOTE — Progress Notes (Signed)
Enrolled patient in copay assistance for Udenyca via [employer] online.  Patient successfully enrolled.  Received approval letter via mail. Patient approved for up to $15,000 maximum program benefit over the next 12 months leaving her with a $0 copay after insurance pays.  Copy given to [facility] for billing/copay follow up.

## 2018-05-28 NOTE — Anesthesia Preprocedure Evaluation (Signed)
Anesthesia Evaluation  Patient identified by MRN, date of birth, ID band Patient awake    Reviewed: Allergy & Precautions, NPO status , Patient's Chart, lab work & pertinent test results  Airway Mallampati: II  TM Distance: >3 FB Neck ROM: Full    Dental  (+) Dental Advisory Given   Pulmonary neg pulmonary ROS,    breath sounds clear to auscultation       Cardiovascular negative cardio ROS   Rhythm:Regular Rate:Normal     Neuro/Psych negative neurological ROS     GI/Hepatic negative GI ROS, Neg liver ROS,   Endo/Other  negative endocrine ROS  Renal/GU negative Renal ROS     Musculoskeletal   Abdominal   Peds  Hematology negative hematology ROS (+)   Anesthesia Other Findings   Reproductive/Obstetrics                             Anesthesia Physical Anesthesia Plan  ASA: II  Anesthesia Plan: General   Post-op Pain Management:    Induction: Intravenous  PONV Risk Score and Plan: 3 and Dexamethasone, Ondansetron and Treatment may vary due to age or medical condition  Airway Management Planned: LMA  Additional Equipment:   Intra-op Plan:   Post-operative Plan: Extubation in OR  Informed Consent: I have reviewed the patients History and Physical, chart, labs and discussed the procedure including the risks, benefits and alternatives for the proposed anesthesia with the patient or authorized representative who has indicated his/her understanding and acceptance.   Dental advisory given  Plan Discussed with: CRNA  Anesthesia Plan Comments:         Anesthesia Quick Evaluation

## 2018-05-28 NOTE — H&P (Signed)
42 yof referred by [place] [facility] for new right breast cancer she has fh of mom with breast cancer at age 40. she stopped breastfeeding 8 months ago. she has prior history of 2016 core biopsy that was benign. has gotten some mris and annual mm due to fh. she has four and a 40 year old. 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 she noted a right breast mass. she was evaluated with mm and [place]. she has c density breasts. on [place] there is a 3.7x1.7x1.5 cm uoq mass and 7 abnormal nodes present. 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 she is here with her husband to discuss options   Past Surgical History Breast Biopsy  Right. Cesarean Section - Multiple   Diagnostic Studies History  Colonoscopy  never Mammogram  within last year  Medication History  Medications Reconciled  Social History Alcohol use  Occasional alcohol use. Caffeine use  Coffee. No drug use  Tobacco use  Never smoker.  Family History  Arthritis  Mother. Breast Cancer  Mother. Hypertension  Father.  Pregnancy / Birth History  Age at menarche  50 years. Gravida  2 Maternal age  61-35 Para  2 Regular periods   Other Problems  Hemorrhoids   Review of Systems General Present- Fatigue. Not Present- Appetite Loss, Chills, Fever, Night Sweats, Weight Gain and Weight Loss. Skin Not Present- Change in Wart/Mole, Dryness, Hives, Jaundice, New Lesions, Non-Healing Wounds, Rash and Ulcer. HEENT Present- Wears glasses/contact lenses. Not Present- Earache, Hearing Loss, Hoarseness, Nose Bleed, Oral Ulcers, Ringing in the Ears, Seasonal Allergies, Sinus Pain, Sore Throat, Visual Disturbances and Yellow Eyes. Respiratory Present- Difficulty Breathing and Snoring. Not Present- Bloody sputum, Chronic Cough and Wheezing. Breast Present- Breast Mass and Breast Pain. Not Present- Nipple Discharge and Skin Changes. Cardiovascular Not Present- Chest Pain,  Difficulty Breathing Lying Down, Leg Cramps, Palpitations, Rapid Heart Rate, Shortness of Breath and Swelling of Extremities. Gastrointestinal Present- Hemorrhoids. Not Present- Abdominal Pain, Bloating, Bloody Stool, Change in Bowel Habits, Chronic diarrhea, Constipation, Difficulty Swallowing, Excessive gas, Gets full quickly at meals, Indigestion, Nausea, Rectal Pain and Vomiting. Female Genitourinary Present- Frequency. Not Present- Nocturia, Painful Urination, Pelvic Pain and Urgency. Musculoskeletal Not Present- Back Pain, Joint Pain, Joint Stiffness, Muscle Pain, Muscle Weakness and Swelling of Extremities. Neurological Not Present- Decreased Memory, Fainting, Headaches, Numbness, Seizures, Tingling, Tremor, Trouble walking and Weakness. Psychiatric Present- Change in Sleep Pattern and Fearful. Not Present- Anxiety, Bipolar, Depression and Frequent crying. Endocrine Not Present- Cold Intolerance, Excessive Hunger, Hair Changes, Heat Intolerance, Hot flashes and New Diabetes. Hematology Not Present- Blood Thinners, Easy Bruising, Excessive bleeding, Gland problems, HIV and Persistent Infections.   Physical Exam  General Mental Status-Alert. Head and Neck Trachea-midline. Thyroid Gland Characteristics - normal size and consistency. Eye Sclera/Conjunctiva - Bilateral-No scleral icterus. Chest and Lung Exam Chest and lung exam reveals -quiet, even and easy respiratory effort with no use of accessory muscles and on auscultation, normal breath sounds, no adventitious sounds and normal vocal resonance. Breast Nipples-No Discharge. Note: right uoq mass measuring 4 cm, not adherent to the skin Cardiovascular Cardiovascular examination reveals -normal heart sounds, regular rate and rhythm with no murmurs. Abdomen Note: soft nt Neurologic Neurologic evaluation reveals -alert and oriented x 3 with no impairment of recent or remote memory. Lymphatic Head & Neck General Head  & Neck Lymphatics: Bilateral - Description - Normal. Axillary General Axillary Region: Bilateral - Description -  Normal. 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 We discussed all of the different options for treatment for breast cancer including surgery, chemotherapy, radiation therapy, Herceptin, and antiestrogen therapy. I think primary systemic therapy is appropriate for this cancer. discussed port placement. she will need alnd at time of surgery later. I think likely will need mastectomy although suppose could consider lumpectomy with great response. she may want to consider double mastectomy pending genetics given fh and her age. we discussed standard mrm, possibilty of nsm although this likely wont be best option in her wont rule it out as well as role of reconstruction.

## 2018-05-28 NOTE — Anesthesia Postprocedure Evaluation (Signed)
Anesthesia Post Note  Patient: Darlene Grant  Procedure(s) Performed: INSERTION PORT-A-CATH WITH ULTRASOUND (N/A )     Patient location during evaluation: PACU Anesthesia Type: General Level of consciousness: awake and alert Pain management: pain level controlled Vital Signs Assessment: post-procedure vital signs reviewed and stable Respiratory status: spontaneous breathing, nonlabored ventilation, respiratory function stable and patient connected to nasal cannula oxygen Cardiovascular status: blood pressure returned to baseline and stable Postop Assessment: no apparent nausea or vomiting Anesthetic complications: no    Last Vitals:  Vitals:   05/28/18 1250 05/28/18 1330  BP:  114/81  Pulse: 80 73  Resp: (!) 24 16  Temp:  36.5 C  SpO2: 100% 100%    Last Pain:  Vitals:   05/28/18 1330  TempSrc:   PainSc: 0-No pain                 Tiajuana Amass

## 2018-05-28 NOTE — Interval H&P Note (Signed)
History and Physical Interval Note:  05/28/2018 11:38 AM  Darlene Grant  has presented today for surgery, with the diagnosis of BREAST CANCER  The various methods of treatment have been discussed with the patient and family. After consideration of risks, benefits and other options for treatment, the patient has consented to  Procedure(s): INSERTION PORT-A-CATH WITH ULTRASOUND (N/A) as a surgical intervention .  The patient's history has been reviewed, patient examined, no change in status, stable for surgery.  I have reviewed the patient's chart and labs.  Questions were answered to the patient's satisfaction.     Rolm Bookbinder

## 2018-05-29 ENCOUNTER — Ambulatory Visit (HOSPITAL_COMMUNITY)
Admission: RE | Admit: 2018-05-29 | Discharge: 2018-05-29 | Disposition: A | Discharge status: Home / Self Care | Payer: BLUE CROSS/BLUE SHIELD | Source: Ambulatory Visit | Attending: Oncology | Admitting: Oncology

## 2018-05-29 ENCOUNTER — Telehealth: Payer: Self-pay | Admitting: *Deleted

## 2018-05-29 ENCOUNTER — Other Ambulatory Visit: Payer: BLUE CROSS/BLUE SHIELD

## 2018-05-29 ENCOUNTER — Encounter (HOSPITAL_COMMUNITY)
Admission: RE | Admit: 2018-05-29 | Discharge: 2018-05-29 | Disposition: A | Discharge status: Home / Self Care | Payer: BLUE CROSS/BLUE SHIELD | Source: Ambulatory Visit | Attending: Oncology | Admitting: Oncology

## 2018-05-29 ENCOUNTER — Encounter (HOSPITAL_BASED_OUTPATIENT_CLINIC_OR_DEPARTMENT_OTHER): Payer: Self-pay | Admitting: General Surgery

## 2018-05-29 DIAGNOSIS — C50411 Malignant neoplasm of upper-outer quadrant of right female breast: Secondary | ICD-10-CM

## 2018-05-29 DIAGNOSIS — Z171 Estrogen receptor negative status [ER-]: Secondary | ICD-10-CM

## 2018-05-29 DIAGNOSIS — C50919 Malignant neoplasm of unspecified site of unspecified female breast: Secondary | ICD-10-CM | POA: Diagnosis not present

## 2018-05-29 DIAGNOSIS — C773 Secondary and unspecified malignant neoplasm of axilla and upper limb lymph nodes: Secondary | ICD-10-CM | POA: Diagnosis not present

## 2018-05-29 MED ORDER — SODIUM CHLORIDE (PF) 0.9 % IJ SOLN
INTRAMUSCULAR | Status: AC
  Filled 2018-05-29: qty 50

## 2018-05-29 MED ORDER — TECHNETIUM TC 99M MEDRONATE IV KIT
21.8000 | PACK | Freq: Once | INTRAVENOUS | Status: AC | PRN
  Administered 2018-05-29: 21.8 via INTRAVENOUS

## 2018-05-29 MED ORDER — IOHEXOL 300 MG/ML  SOLN
100.0000 mL | Freq: Once | INTRAMUSCULAR | Status: AC | PRN
  Administered 2018-05-29: 100 mL via INTRAVENOUS

## 2018-05-29 NOTE — Telephone Encounter (Signed)
Called pt with negative CT result. Denies further needs at this time. Informed pt will call with bone scan results. Received verbal understanding.

## 2018-05-30 ENCOUNTER — Other Ambulatory Visit: Payer: Self-pay | Admitting: *Deleted

## 2018-05-30 ENCOUNTER — Other Ambulatory Visit: Payer: Self-pay | Admitting: Oncology

## 2018-05-30 DIAGNOSIS — R948 Abnormal results of function studies of other organs and systems: Secondary | ICD-10-CM

## 2018-05-30 NOTE — Progress Notes (Signed)
Called pt with bone scan results. Discussed Dr. Jana Hakim recommendations. Received verbal understanding.

## 2018-06-01 ENCOUNTER — Encounter: Payer: Self-pay | Admitting: Adult Health

## 2018-06-03 ENCOUNTER — Other Ambulatory Visit: Payer: Self-pay | Admitting: Oncology

## 2018-06-03 ENCOUNTER — Telehealth: Payer: Self-pay | Admitting: Adult Health

## 2018-06-03 ENCOUNTER — Encounter: Payer: Self-pay | Admitting: Adult Health

## 2018-06-03 ENCOUNTER — Encounter: Payer: Self-pay | Admitting: *Deleted

## 2018-06-03 ENCOUNTER — Inpatient Hospital Stay: Payer: BLUE CROSS/BLUE SHIELD

## 2018-06-03 ENCOUNTER — Inpatient Hospital Stay (HOSPITAL_BASED_OUTPATIENT_CLINIC_OR_DEPARTMENT_OTHER): Payer: BLUE CROSS/BLUE SHIELD | Admitting: Adult Health

## 2018-06-03 VITALS — BP 116/83 | HR 79 | Temp 98.5°F | Resp 18 | Ht 64.25 in | Wt 143.1 lb

## 2018-06-03 DIAGNOSIS — Z171 Estrogen receptor negative status [ER-]: Secondary | ICD-10-CM

## 2018-06-03 DIAGNOSIS — R51 Headache: Secondary | ICD-10-CM | POA: Diagnosis not present

## 2018-06-03 DIAGNOSIS — C773 Secondary and unspecified malignant neoplasm of axilla and upper limb lymph nodes: Secondary | ICD-10-CM

## 2018-06-03 DIAGNOSIS — Z5189 Encounter for other specified aftercare: Secondary | ICD-10-CM | POA: Diagnosis not present

## 2018-06-03 DIAGNOSIS — C50411 Malignant neoplasm of upper-outer quadrant of right female breast: Secondary | ICD-10-CM

## 2018-06-03 DIAGNOSIS — Z95828 Presence of other vascular implants and grafts: Secondary | ICD-10-CM

## 2018-06-03 DIAGNOSIS — Z5111 Encounter for antineoplastic chemotherapy: Secondary | ICD-10-CM | POA: Diagnosis not present

## 2018-06-03 DIAGNOSIS — Z452 Encounter for adjustment and management of vascular access device: Secondary | ICD-10-CM | POA: Diagnosis not present

## 2018-06-03 LAB — COMPREHENSIVE METABOLIC PANEL
ALT: 15 U/L (ref 0–44)
AST: 10 U/L — ABNORMAL LOW (ref 15–41)
Albumin: 4.1 g/dL (ref 3.5–5.0)
Alkaline Phosphatase: 52 U/L (ref 38–126)
Anion gap: 11 (ref 5–15)
BUN: 11 mg/dL (ref 6–20)
CO2: 26 mmol/L (ref 22–32)
CREATININE: 0.76 mg/dL (ref 0.44–1.00)
Calcium: 9.3 mg/dL (ref 8.9–10.3)
Chloride: 105 mmol/L (ref 98–111)
GFR calc Af Amer: >60 mL/min (ref >60)
GFR calc non Af Amer: >60 mL/min (ref >60)
Glucose, Bld: 106 mg/dL — ABNORMAL HIGH (ref 70–99)
Potassium: 4 mmol/L (ref 3.5–5.1)
Sodium: 142 mmol/L (ref 135–145)
Total Bilirubin: 0.7 mg/dL (ref 0.3–1.2)
Total Protein: 6.9 g/dL (ref 6.5–8.1)

## 2018-06-03 LAB — CBC WITH DIFFERENTIAL/PLATELET
Abs Immature Granulocytes: 0.03 10*3/uL (ref 0.00–0.07)
Basophils Absolute: 0 10*3/uL (ref 0.0–0.1)
Basophils Relative: 0 %
EOS PCT: 1 %
Eosinophils Absolute: 0.1 10*3/uL (ref 0.0–0.5)
HEMATOCRIT: 37 % (ref 36.0–46.0)
Hemoglobin: 12.2 g/dL (ref 12.0–15.0)
Immature Granulocytes: 1 %
LYMPHS ABS: 1.9 10*3/uL (ref 0.7–4.0)
Lymphocytes Relative: 30 %
MCH: 30.4 pg (ref 26.0–34.0)
MCHC: 33 g/dL (ref 30.0–36.0)
MCV: 92.3 fL (ref 80.0–100.0)
Monocytes Absolute: 0.4 10*3/uL (ref 0.1–1.0)
Monocytes Relative: 6 %
Neutro Abs: 3.9 10*3/uL (ref 1.7–7.7)
Neutrophils Relative %: 62 %
Platelets: 287 10*3/uL (ref 150–400)
RBC: 4.01 MIL/uL (ref 3.87–5.11)
RDW: 12.8 % (ref 11.5–15.5)
WBC: 6.3 10*3/uL (ref 4.0–10.5)
nRBC: 0 % (ref 0.0–0.2)

## 2018-06-03 MED ORDER — HEPARIN SOD (PORK) LOCK FLUSH 100 UNIT/ML IV SOLN
500.0000 [IU] | Freq: Once | INTRAVENOUS | Status: AC | PRN
  Administered 2018-06-03: 500 [IU]
  Filled 2018-06-03: qty 5

## 2018-06-03 MED ORDER — PALONOSETRON HCL INJECTION 0.25 MG/5ML
INTRAVENOUS | Status: AC
  Filled 2018-06-03: qty 5

## 2018-06-03 MED ORDER — SODIUM CHLORIDE 0.9 % IV SOLN
600.0000 mg/m2 | Freq: Once | INTRAVENOUS | Status: AC
  Administered 2018-06-03: 1040 mg via INTRAVENOUS
  Filled 2018-06-03: qty 52

## 2018-06-03 MED ORDER — SODIUM CHLORIDE 0.9% FLUSH
10.0000 mL | INTRAVENOUS | Status: DC | PRN
  Administered 2018-06-03: 10 mL
  Filled 2018-06-03: qty 10

## 2018-06-03 MED ORDER — SODIUM CHLORIDE 0.9% FLUSH
10.0000 mL | INTRAVENOUS | Status: DC | PRN
  Administered 2018-06-03: 10 mL via INTRAVENOUS
  Filled 2018-06-03: qty 10

## 2018-06-03 MED ORDER — PROCHLORPERAZINE MALEATE 10 MG PO TABS: 10.0000 mg | ORAL_TABLET | Freq: Four times a day (QID) | ORAL | 1 refills | Status: DC | PRN

## 2018-06-03 MED ORDER — SODIUM CHLORIDE 0.9 % IV SOLN
Freq: Once | INTRAVENOUS | Status: AC
  Administered 2018-06-03: 10:00:00 via INTRAVENOUS
  Filled 2018-06-03: qty 5

## 2018-06-03 MED ORDER — DEXAMETHASONE 4 MG PO TABS: ORAL_TABLET | ORAL | 1 refills | Status: DC

## 2018-06-03 MED ORDER — SODIUM CHLORIDE 0.9 % IV SOLN
Freq: Once | INTRAVENOUS | Status: AC
  Administered 2018-06-03: 10:00:00 via INTRAVENOUS
  Filled 2018-06-03: qty 250

## 2018-06-03 MED ORDER — DOXORUBICIN HCL CHEMO IV INJECTION 2 MG/ML
60.0000 mg/m2 | Freq: Once | INTRAVENOUS | Status: AC
  Administered 2018-06-03: 104 mg via INTRAVENOUS
  Filled 2018-06-03: qty 52

## 2018-06-03 MED ORDER — PALONOSETRON HCL INJECTION 0.25 MG/5ML
0.2500 mg | Freq: Once | INTRAVENOUS | Status: AC
  Administered 2018-06-03: 0.25 mg via INTRAVENOUS

## 2018-06-03 NOTE — Telephone Encounter (Signed)
Per 12/16 no los °

## 2018-06-03 NOTE — Patient Instructions (Addendum)
[facility] Discharge Instructions for Patients Receiving Chemotherapy  Today you received the following chemotherapy agents adriamycin (Doxorubicin) and cyclophosphamide (Cytoxan)  To help prevent nausea and vomiting after your treatment, we encourage you to take your nausea medication as directed by your doctor.   If you develop nausea and vomiting that is not controlled by your nausea medication, call the clinic.   BELOW ARE SYMPTOMS THAT SHOULD BE REPORTED IMMEDIATELY:  *FEVER GREATER THAN 100.5 F  *CHILLS WITH OR WITHOUT FEVER  NAUSEA AND VOMITING THAT IS NOT CONTROLLED WITH YOUR NAUSEA MEDICATION  *UNUSUAL SHORTNESS OF BREATH  *UNUSUAL BRUISING OR BLEEDING  TENDERNESS IN MOUTH AND THROAT WITH OR WITHOUT PRESENCE OF ULCERS  *URINARY PROBLEMS  *BOWEL PROBLEMS  UNUSUAL RASH Items with * indicate a potential emergency and should be followed up as soon as possible.  Feel free to call the clinic should you have any questions or concerns. The clinic phone number is [phone].  Please show the [place] at check-in to the Emergency Department and triage nurse.  Doxorubicin injection What is this medicine? DOXORUBICIN (dox oh ROO bi sin) is a chemotherapy drug. It is used to treat many kinds of cancer like leukemia, lymphoma, neuroblastoma, sarcoma, and Wilms' tumor. It is also used to treat bladder cancer, breast cancer, lung cancer, ovarian cancer, stomach cancer, and thyroid cancer. This medicine may be used for other purposes; ask your health care provider or pharmacist if you have questions. COMMON BRAND NAME(S): Adriamycin, Adriamycin PFS, Adriamycin RDF, Rubex What should I tell my health care provider before I take this medicine? They need to know if you have any of these conditions: -heart disease -history of low blood counts caused by a medicine -liver disease -recent or ongoing radiation therapy -an unusual or allergic reaction to  doxorubicin, other chemotherapy agents, other medicines, foods, dyes, or preservatives -pregnant or trying to get pregnant -breast-feeding How should I use this medicine? This drug is given as an infusion into a vein. It is administered in a hospital or clinic by a specially trained health care professional. If you have pain, swelling, burning or any unusual feeling around the site of your injection, tell your health care professional right away. Talk to your pediatrician regarding the use of this medicine in children. Special care may be needed. Overdosage: If you think you have taken too much of this medicine contact a poison control center or emergency room at once. NOTE: This medicine is only for you. Do not share this medicine with others. What if I miss a dose? It is important not to miss your dose. Call your doctor or health care professional if you are unable to keep an appointment. What may interact with this medicine? This medicine may interact with the following medications: -6-mercaptopurine -paclitaxel -phenytoin -St. John's Wort -trastuzumab -verapamil This list may not describe all possible interactions. Give your health care provider a list of all the medicines, herbs, non-prescription drugs, or dietary supplements you use. Also tell them if you smoke, drink alcohol, or use illegal drugs. Some items may interact with your medicine. What should I watch for while using this medicine? This drug may make you feel generally unwell. This is not uncommon, as chemotherapy can affect healthy cells as well as cancer cells. Report any side effects. Continue your course of treatment even though you feel ill unless your doctor tells you to stop. There is a maximum amount of this medicine you should receive throughout your  life. The amount depends on the medical condition being treated and your overall health. Your doctor will watch how much of this medicine you receive in your lifetime. Tell  your doctor if you have taken this medicine before. You may need blood work done while you are taking this medicine. Your urine may turn red for a few days after your dose. This is not blood. If your urine is dark or brown, call your doctor. In some cases, you may be given additional medicines to help with side effects. Follow all directions for their use. Call your doctor or health care professional for advice if you get a fever, chills or sore throat, or other symptoms of a cold or flu. Do not treat yourself. This drug decreases your body's ability to fight infections. Try to avoid being around people who are sick. This medicine may increase your risk to bruise or bleed. Call your doctor or health care professional if you notice any unusual bleeding. Talk to your doctor about your risk of cancer. You may be more at risk for certain types of cancers if you take this medicine. Do not become pregnant while taking this medicine or for 6 months after stopping it. Women should inform their doctor if they wish to become pregnant or think they might be pregnant. Men should not father a child while taking this medicine and for 6 months after stopping it. There is a potential for serious side effects to an unborn child. Talk to your health care professional or pharmacist for more information. Do not breast-feed an infant while taking this medicine. This medicine has caused ovarian failure in some women and reduced sperm counts in some men This medicine may interfere with the ability to have a child. Talk with your doctor or health care professional if you are concerned about your fertility. What side effects may I notice from receiving this medicine? Side effects that you should report to your doctor or health care professional as soon as possible: -allergic reactions like skin rash, itching or hives, swelling of the face, lips, or tongue -breathing problems -chest pain -fast or irregular heartbeat -low blood  counts - this medicine may decrease the number of white blood cells, red blood cells and platelets. You may be at increased risk for infections and bleeding. -pain, redness, or irritation at site where injected -signs of infection - fever or chills, cough, sore throat, pain or difficulty passing urine -signs of decreased platelets or bleeding - bruising, pinpoint red spots on the skin, black, tarry stools, blood in the urine -swelling of the ankles, feet, hands -tiredness -weakness Side effects that usually do not require medical attention (report to your doctor or health care professional if they continue or are bothersome): -diarrhea -hair loss -mouth sores -nail discoloration or damage -nausea -red colored urine -vomiting This list may not describe all possible side effects. Call your doctor for medical advice about side effects. You may report side effects to FDA at 1-800-FDA-1088. Where should I keep my medicine? This drug is given in a hospital or clinic and will not be stored at home. NOTE: This sheet is a summary. It may not cover all possible information. If you have questions about this medicine, talk to your doctor, pharmacist, or health care provider.  2018 [employer]/Gold Standard (2015-08-02 11:28:51)  Cyclophosphamide injection What is this medicine? CYCLOPHOSPHAMIDE (sye kloe FOSS fa mide) is a chemotherapy drug. It slows the growth of cancer cells. This medicine is used to treat many  types of cancer like lymphoma, myeloma, leukemia, breast cancer, and ovarian cancer, to name a few. This medicine may be used for other purposes; ask your health care provider or pharmacist if you have questions. COMMON BRAND NAME(S): Cytoxan, Neosar What should I tell my health care provider before I take this medicine? They need to know if you have any of these conditions: -blood disorders -history of other chemotherapy -infection -kidney disease -liver disease -recent or ongoing  radiation therapy -tumors in the bone marrow -an unusual or allergic reaction to cyclophosphamide, other chemotherapy, other medicines, foods, dyes, or preservatives -pregnant or trying to get pregnant -breast-feeding How should I use this medicine? This drug is usually given as an injection into a vein or muscle or by infusion into a vein. It is administered in a hospital or clinic by a specially trained health care professional. Talk to your pediatrician regarding the use of this medicine in children. Special care may be needed. Overdosage: If you think you have taken too much of this medicine contact a poison control center or emergency room at once. NOTE: This medicine is only for you. Do not share this medicine with others. What if I miss a dose? It is important not to miss your dose. Call your doctor or health care professional if you are unable to keep an appointment. What may interact with this medicine? This medicine may interact with the following medications: -amiodarone -amphotericin B -azathioprine -certain antiviral medicines for HIV or AIDS such as protease inhibitors (e.g., indinavir, ritonavir) and zidovudine -certain blood pressure medications such as benazepril, captopril, enalapril, fosinopril, lisinopril, moexipril, monopril, perindopril, quinapril, ramipril, trandolapril -certain cancer medications such as anthracyclines (e.g., daunorubicin, doxorubicin), busulfan, cytarabine, paclitaxel, pentostatin, tamoxifen, trastuzumab -certain diuretics such as chlorothiazide, chlorthalidone, hydrochlorothiazide, indapamide, metolazone -certain medicines that treat or prevent blood clots like warfarin -certain muscle relaxants such as succinylcholine -cyclosporine -etanercept -indomethacin -medicines to increase blood counts like filgrastim, pegfilgrastim, sargramostim -medicines used as general anesthesia -metronidazole -natalizumab This list may not describe all possible  interactions. Give your health care provider a list of all the medicines, herbs, non-prescription drugs, or dietary supplements you use. Also tell them if you smoke, drink alcohol, or use illegal drugs. Some items may interact with your medicine. What should I watch for while using this medicine? Visit your doctor for checks on your progress. This drug may make you feel generally unwell. This is not uncommon, as chemotherapy can affect healthy cells as well as cancer cells. Report any side effects. Continue your course of treatment even though you feel ill unless your doctor tells you to stop. Drink water or other fluids as directed. Urinate often, even at night. In some cases, you may be given additional medicines to help with side effects. Follow all directions for their use. Call your doctor or health care professional for advice if you get a fever, chills or sore throat, or other symptoms of a cold or flu. Do not treat yourself. This drug decreases your body's ability to fight infections. Try to avoid being around people who are sick. This medicine may increase your risk to bruise or bleed. Call your doctor or health care professional if you notice any unusual bleeding. Be careful brushing and flossing your teeth or using a toothpick because you may get an infection or bleed more easily. If you have any dental work done, tell your dentist you are receiving this medicine. You may get drowsy or dizzy. Do not drive, use machinery, or do  anything that needs mental alertness until you know how this medicine affects you. Do not become pregnant while taking this medicine or for 1 year after stopping it. Women should inform their doctor if they wish to become pregnant or think they might be pregnant. Men should not father a child while taking this medicine and for 4 months after stopping it. There is a potential for serious side effects to an unborn child. Talk to your health care professional or pharmacist for  more information. Do not breast-feed an infant while taking this medicine. This medicine may interfere with the ability to have a child. This medicine has caused ovarian failure in some women. This medicine has caused reduced sperm counts in some men. You should talk with your doctor or health care professional if you are concerned about your fertility. If you are going to have surgery, tell your doctor or health care professional that you have taken this medicine. What side effects may I notice from receiving this medicine? Side effects that you should report to your doctor or health care professional as soon as possible: -allergic reactions like skin rash, itching or hives, swelling of the face, lips, or tongue -low blood counts - this medicine may decrease the number of white blood cells, red blood cells and platelets. You may be at increased risk for infections and bleeding. -signs of infection - fever or chills, cough, sore throat, pain or difficulty passing urine -signs of decreased platelets or bleeding - bruising, pinpoint red spots on the skin, black, tarry stools, blood in the urine -signs of decreased red blood cells - unusually weak or tired, fainting spells, lightheadedness -breathing problems -dark urine -dizziness -palpitations -swelling of the ankles, feet, hands -trouble passing urine or change in the amount of urine -weight gain -yellowing of the eyes or skin Side effects that usually do not require medical attention (report to your doctor or health care professional if they continue or are bothersome): -changes in nail or skin color -hair loss -missed menstrual periods -mouth sores -nausea, vomiting This list may not describe all possible side effects. Call your doctor for medical advice about side effects. You may report side effects to FDA at 1-800-FDA-1088. Where should I keep my medicine? This drug is given in a hospital or clinic and will not be stored at  home. NOTE: This sheet is a summary. It may not cover all possible information. If you have questions about this medicine, talk to your doctor, pharmacist, or health care provider.  2018 [employer]/Gold Standard (2012-04-19 16:22:58)

## 2018-06-03 NOTE — Patient Instructions (Signed)

## 2018-06-03 NOTE — Progress Notes (Signed)
[place]  [phone] Fax:([phone]) [phone]     ID: Darlene Grant DOB: 1975-12-27  MR#: [id number]  LNL#:[id number]  Patient Care Team: Lavada Mesi as PCP - General (Family Medicine) Rolm Bookbinder, MD as Consulting Physician (General Surgery) Magrinat, Virgie Dad, MD as Consulting Physician (Oncology) Gery Pray, MD as Consulting Physician (Radiation Oncology) Emily Filbert, MD as Consulting Physician (Obstetrics and Gynecology) Lavonna Monarch, MD as Consulting Physician (Dermatology) OTHER MD:   CHIEF COMPLAINT: Triple negative breast cancer  CURRENT TREATMENT: Neoadjuvant chemotherapy   HISTORY OF CURRENT ILLNESS: Darlene Grant had routine screening mammography on 04/19/2018 showing a possible abnormality in the right breast. She underwent unilateral right diagnostic mammography with tomography and right breast ultrasonography at [facility] on 05/02/2018 showing: Breast Density Category C: There was an oval circumscribed mass in the posterior aspect of the upper outer right breast.  On exam this measured 3.5 cm, was firm, irregular and centered on 10-11 o'clock 5 cm from the nipple.  By ultrasonography there was a 3.7 x 1.7 x 1.5 cm elongated, irregular, hypoechoic mass in the 10:30 o'clock position of the right breast, 5 cm from the nipple.  Ultrasound of the right axilla showed 7 abnormal right axillary lymph nodes highly suspicious for metastatic nodes.  Accordingly on 05/03/2018 she proceeded to biopsy of the right breast area in question and one suspicious lymph node. The pathology from this procedure showed ([id number]): Right Breast: invasive mammary carcinoma, grade 3, E-cadherin positive (and therefore ductal) lymphovascular invasion present. Lymph node: Invasive mammary carcinoma Prognostic indicators significant for: estrogen receptor, 0% negative and progesterone receptor, 0% negative. Proliferation  marker Ki67 at 75%. HER2 negative (1+).  The patient's subsequent history is as detailed below.  INTERVAL HISTORY: Darlene Grant is here today for follow up of her triple negative breast cancer prior to starting neoadjuvant chemotherapy with dose dense Doxorubicin and Cyclophosphamide every 2 week x 4 cycles with Udenyca support, to be followed by weekly Paclitaxel and Carboplatin.  She is here today for cycle 1 day 1 of Doxorubicin and Cyclophosphamide.    Since her last visit she underwent CT C/A/P and bone scan on 05/29/18.  The CT scan shows no evidence of distant metastases, and bone scan has one area of abnormality on the femur and is due for plain films which she plans to do tomorrow.  MRI of the breasts was also done on 05/31/18 that showed an additional area of non mass enhancement inferomedially in the right breast, and enlarged lymph nodes in levels 1, 2, and 3.    She also underwent echocardiogram on 05/23/2018 that showed a LVEF of 60-65%.    REVIEW OF SYSTEMS: Darlene Grant underwent port placement last week.  This went well, however she still has some residual soreness.  She is feeling well.  She tells me that now that she wished she had asked her surgeon, Dr. Donne Hazel more questions about surgery initially, and now that she has had time to process things she has thought of more things to ask.  She denies any new issues.  She is without fevers, chills, unusual headaches or vision changes.  She is without chest pain, palpitations, cough, shortness of breath.  She isn't having bowel/bladder changes or nausea/vomiting.  A detailed ROS was otherwise non contributory today.     PAST MEDICAL HISTORY: Past Medical History:  Diagnosis Date  . Abnormal Pap smear    ASCUS 03/13/11  . Anemia   . Breast  pain, left   . Cancer ([place])   . Candida vaginitis   . Complication of anesthesia    pt states that all narcotics make her angry and she would prefer to avoid them.  . Rectal itching   . Vaginal Pap  smear, abnormal     PAST SURGICAL HISTORY: Past Surgical History:  Procedure Laterality Date  . BREAST BIOPSY Right 8/16  . CESAREAN SECTION N/A 11/21/2013   Procedure: CESAREAN SECTION;  Surgeon: Alwyn Pea, MD;  Location: [facility];  Service: Obstetrics;  Laterality: N/A;  . CESAREAN SECTION N/A 06/23/2016   Procedure: CESAREAN SECTION;  Surgeon: Emily Filbert, MD;  Location: [facility];  Service: Obstetrics;  Laterality: N/A;  . HAND SURGERY  04/2012   ligament repair  . PILONIDAL CYST EXCISION  2000  . PORTACATH PLACEMENT N/A 05/28/2018   Procedure: INSERTION PORT-A-CATH WITH ULTRASOUND;  Surgeon: Rolm Bookbinder, MD;  Location: [facility];  Service: General;  Laterality: N/A;  . WISDOM TOOTH EXTRACTION  AGE 51    FAMILY HISTORY Family History  Problem Relation Age of Onset  . Cancer Mother 76       breast, premenopausal  . Breast cancer Mother   . Cancer Maternal Grandmother        skin, stomach  . Cancer Maternal Grandfather        lung    As of November 2019 her father is 10 years old with a history of Parkinson's. Patients' mother is 66 years old with a history of breast cancer, diagnosed age 49, treated with radical mastectomy. The patient has no brothers and two sisters. Besides the patient's mother, the patient has no other family medical history of breast or ovarian cancer. Her maternal grandmother had stomach cancer and her maternal grandfather had lung cancer, --he was a smoker.    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  Her husband, [name], an Chief Financial Officer. She has two children, [name], age 50, and [name], age 24 months.  The patient is not a smoker. She does drink alcohol  and drinks an estimated one to two drinks per week.        ADVANCED DIRECTIVES: [name], her husband, is her healthcare department  HEALTH MAINTENANCE: Social History   Tobacco Use  . Smoking status: Never Smoker  . Smokeless tobacco: Never Used  Substance Use Topics  . Alcohol use: Yes    Alcohol/week: 0.0 standard drinks    Comment: occassionally  . Drug use: No     Colonoscopy: no  PAP: 12/2017  Bone density: no   Allergies  Allergen Reactions  . Augmentin [Amoxicillin-Pot Clavulanate]     diarrhea  . Hydrocodone Itching  . Keflex [Cephalexin] Diarrhea    Current Outpatient Medications  Medication Sig Dispense Refill  . Black Elderberry 50 MG/5ML SYRP Take by mouth.    . cyclobenzaprine (FLEXERIL) 10 MG tablet Take 0.5-1 tablets (5-10 mg total) by mouth 3 (three) times daily as needed for muscle spasms. 30 tablet 1  . lidocaine-prilocaine (EMLA) cream Apply to affected area once 30 g 3  . loratadine (  CLARITIN) 10 MG tablet Take 1 tablet (10 mg total) by mouth daily. 90 tablet 0  . LORazepam (ATIVAN) 0.5 MG tablet Take 1 tablet (0.5 mg total) by mouth at bedtime as needed (Nausea or vomiting). 30 tablet 0  . Probiotic Product (PROBIOTIC DAILY PO) Take 1 tablet by mouth.    . prochlorperazine (COMPAZINE) 10 MG tablet Take 1 tablet (10 mg total) by mouth every 6 (six) hours as needed for nausea or vomiting. 30 tablet 0  . AZO-CRANBERRY PO Take by mouth.    . traMADol (ULTRAM) 50 MG tablet Take 1 tablet (50 mg total) by mouth every 6 (six) hours as needed. (Patient not taking: Reported on 06/03/2018) 6 tablet 0   No current facility-administered medications for this visit.     OBJECTIVE:  Vitals:   06/03/18 0848  BP: 116/83  Pulse: 79  Resp: 18  Temp: 98.5 F (36.9 C)  SpO2: 100%     Body mass index is 24.37 kg/m.   Wt Readings from Last 3 Encounters:  06/03/18 143 lb 1.6 oz (64.9 kg)  05/28/18 141 lb 8.6 oz (64.2 kg)  05/15/18 144 lb 3.2 oz (65.4 kg)  ECOG  FS:0 - Asymptomatic GENERAL: Patient is a well appearing female in no acute distress HEENT:  Sclerae anicteric.  Oropharynx clear and moist. No ulcerations or evidence of oropharyngeal candidiasis. Neck is supple.  NODES:  No cervical, supraclavicular, or axillary lymphadenopathy palpated.  BREAST EXAM:  Right breast with small upper outer quadrant nodule noted LUNGS:  Clear to auscultation bilaterally.  No wheezes or rhonchi. HEART:  Regular rate and rhythm. No murmur appreciated. ABDOMEN:  Soft, nontender.  Positive, normoactive bowel sounds. No organomegaly palpated. MSK:  No focal spinal tenderness to palpation. Full range of motion bilaterally in the upper extremities. EXTREMITIES:  No peripheral edema.   SKIN:  Clear with no obvious rashes or skin changes. No nail dyscrasia. NEURO:  Nonfocal. Well oriented.  Appropriate affect.     LAB RESULTS:  CMP     Component Value Date/Time   NA 142 05/15/2018 0827   K 4.0 05/15/2018 0827   CL 106 05/15/2018 0827   CO2 27 05/15/2018 0827   GLUCOSE 95 05/15/2018 0827   BUN 7 05/15/2018 0827   CREATININE 0.76 05/15/2018 0827   CREATININE 0.78 01/15/2018 1033   CALCIUM 10.0 05/15/2018 0827   PROT 7.2 05/15/2018 0827   ALBUMIN 4.4 05/15/2018 0827   AST 10 (L) 05/15/2018 0827   ALT 9 05/15/2018 0827   ALKPHOS 60 05/15/2018 0827   BILITOT 1.1 05/15/2018 0827   GFRNONAA >60 05/15/2018 0827   GFRNONAA 95 01/15/2018 1033   GFRAA >60 05/15/2018 0827   GFRAA 110 01/15/2018 1033    No results found for: TOTALPROTELP, ALBUMINELP, A1GS, A2GS, BETS, BETA2SER, GAMS, MSPIKE, SPEI  No results found for: KPAFRELGTCHN, LAMBDASER, KAPLAMBRATIO  Lab Results  Component Value Date   WBC 5.7 05/15/2018   NEUTROABS 3.8 05/15/2018   HGB 12.1 05/15/2018   HCT 36.5 05/15/2018   MCV 92.4 05/15/2018   PLT 259 05/15/2018    '@LASTCHEMISTRY'$ @  No results found for: LABCA2  No components found for: [id number]  No results for input(s): INR in the  last 168 hours.  No results found for: LABCA2  No results found for: ZHG992  No results found for: EQA834  No results found for: HDQ222  No results found for: CA2729  No components found for: HGQUANT  No results found for: CEA1 /  No results found for: CEA1   No results found for: AFPTUMOR  No results found for: CHROMOGRNA  No results found for: PSA1  No visits with results within 3 Day(s) from this visit.  Latest known visit with results is:  Appointment on 05/15/2018  Component Date Value Ref Range Status  . WBC Count 05/15/2018 5.7  4.0 - 10.5 K/uL Final  . RBC 05/15/2018 3.95  3.87 - 5.11 MIL/uL Final  . Hemoglobin 05/15/2018 12.1  12.0 - 15.0 g/dL Final  . HCT 05/15/2018 36.5  36.0 - 46.0 % Final  . MCV 05/15/2018 92.4  80.0 - 100.0 fL Final  . MCH 05/15/2018 30.6  26.0 - 34.0 pg Final  . MCHC 05/15/2018 33.2  30.0 - 36.0 g/dL Final  . RDW 05/15/2018 12.6  11.5 - 15.5 % Final  . Platelet Count 05/15/2018 259  150 - 400 K/uL Final  . nRBC 05/15/2018 0.0  0.0 - 0.2 % Final  . Neutrophils Relative % 05/15/2018 67  % Final  . Neutro Abs 05/15/2018 3.8  1.7 - 7.7 K/uL Final  . Lymphocytes Relative 05/15/2018 25  % Final  . Lymphs Abs 05/15/2018 1.4  0.7 - 4.0 K/uL Final  . Monocytes Relative 05/15/2018 7  % Final  . Monocytes Absolute 05/15/2018 0.4  0.1 - 1.0 K/uL Final  . Eosinophils Relative 05/15/2018 1  % Final  . Eosinophils Absolute 05/15/2018 0.1  0.0 - 0.5 K/uL Final  . Basophils Relative 05/15/2018 0  % Final  . Basophils Absolute 05/15/2018 0.0  0.0 - 0.1 K/uL Final  . Immature Granulocytes 05/15/2018 0  % Final  . Abs Immature Granulocytes 05/15/2018 0.01  0.00 - 0.07 K/uL Final   Performed at [facility], [place] [address] [place], [place] [address]  . Sodium 05/15/2018 142  135 - 145 mmol/L Final  . Potassium 05/15/2018 4.0  3.5 - 5.1 mmol/L Final  . Chloride 05/15/2018 106  98 - 111 mmol/L Final  . CO2 05/15/2018 27  22 - 32  mmol/L Final  . Glucose, Bld 05/15/2018 95  70 - 99 mg/dL Final  . BUN 05/15/2018 7  6 - 20 mg/dL Final  . Creatinine 05/15/2018 0.76  0.44 - 1.00 mg/dL Final  . Calcium 05/15/2018 10.0  8.9 - 10.3 mg/dL Final  . Total Protein 05/15/2018 7.2  6.5 - 8.1 g/dL Final  . Albumin 05/15/2018 4.4  3.5 - 5.0 g/dL Final  . AST 05/15/2018 10* 15 - 41 U/L Final  . ALT 05/15/2018 9  0 - 44 U/L Final  . Alkaline Phosphatase 05/15/2018 60  38 - 126 U/L Final  . Total Bilirubin 05/15/2018 1.1  0.3 - 1.2 mg/dL Final  . GFR, Est Non Af Am 05/15/2018 >60  >60 mL/min Final  . GFR, Est AFR Am 05/15/2018 >60  >60 mL/min Final  . Anion gap 05/15/2018 9  5 - 15 Final   Performed at [facility], [place] [address], [place], [place] [address]    (this displays the last labs from the last 3 days)  No results found for: TOTALPROTELP, ALBUMINELP, A1GS, A2GS, BETS, BETA2SER, GAMS, MSPIKE, SPEI (this displays SPEP labs)  No results found for: KPAFRELGTCHN, LAMBDASER, KAPLAMBRATIO (kappa/lambda light chains)  No results found for: HGBA, HGBA2QUANT, HGBFQUANT, HGBSQUAN (Hemoglobinopathy evaluation)   No results found for: LDH  Lab Results  Component Value Date   IRON 59 12/27/2016   TIBC 329 12/27/2016   IRONPCTSAT 18  12/27/2016   (Iron and TIBC)  Lab Results  Component Value Date   FERRITIN 24 01/15/2018    Urinalysis    Component Value Date/Time   COLORURINE YELLOW 12/18/2016 [place] 12/18/2016 1607   LABSPEC 1.010 12/18/2016 1607   PHURINE 7.0 12/18/2016 1607   GLUCOSEU NEGATIVE 12/18/2016 1607   HGBUR NEGATIVE 12/18/2016 1607   HGBUR negative 01/13/2010 0940   BILIRUBINUR NEGATIVE 12/18/2016 1607   BILIRUBINUR neg 05/17/2015 1034   KETONESUR NEGATIVE 12/18/2016 1607   PROTEINUR NEGATIVE 12/18/2016 1607   UROBILINOGEN 0.2 05/17/2015 1034   UROBILINOGEN 0.2 01/13/2010 0940   NITRITE NEGATIVE 12/18/2016 1607   LEUKOCYTESUR NEGATIVE 12/18/2016 1607      STUDIES:  Dg Chest 1 View  Result Date: 05/28/2018 CLINICAL DATA:  Right-sided Port-A-Cath insertion. EXAM: CHEST  1 VIEW COMPARISON:  No recent prior. FINDINGS: Port-A-Cath noted with tip over superior vena cava. No pneumothorax noted. Surgical sponge and clips noted over the right lower chest. IMPRESSION: Port-A-Cath noted with tip over superior vena cava. Electronically Signed   By: Marcello Moores  Register   On: 05/28/2018 13:00   Ct Chest W Contrast  Result Date: 05/29/2018 CLINICAL DATA:  Newly diagnosed right breast cancer, for staging. Chemotherapy starting next week. EXAM: CT CHEST, ABDOMEN, AND PELVIS WITH CONTRAST TECHNIQUE: Multidetector CT imaging of the chest, abdomen and pelvis was performed following the standard protocol during bolus administration of intravenous contrast. CONTRAST:  182m OMNIPAQUE IOHEXOL 300 MG/ML  SOLN COMPARISON:  None. FINDINGS: CT CHEST FINDINGS Cardiovascular: The heart is normal in size. No pericardial effusion. No evidence of thoracic aortic aneurysm. Right chest port terminates in the lower SVC. Mediastinum/Nodes: Right axillary nodes measuring up to 10 mm short axis (series 2/image 18), suspicious. Additional right subpectoral nodes measuring up to 7 mm short axis (series 2/image 12), suspicious. No suspicious mediastinal or hilar lymphadenopathy. Visualized thyroid is unremarkable. Lungs/Pleura: Lungs are clear. No suspicious pulmonary nodules. No focal consolidation. No pleural effusion or pneumothorax. Musculoskeletal: 2.1 cm lateral right breast mass (series 2/image 22), likely corresponding to the patient's known newly diagnosed breast cancer. Visualized osseous structures are within normal limits. CT ABDOMEN PELVIS FINDINGS Hepatobiliary: Liver is within normal limits. Gallbladder is unremarkable. No intrahepatic or extrahepatic ductal dilatation. Pancreas: Within normal limits. Spleen: Within normal limits. Adrenals/Urinary Tract: Adrenal glands are  within normal limits. Kidneys are within normal limits.  No hydronephrosis. Bladder is underdistended but unremarkable. Stomach/Bowel: Stomach is within normal limits. No evidence of bowel obstruction. Normal appendix (series 2/image 36). Vascular/Lymphatic: No evidence of abdominal aortic aneurysm. No suspicious abdominopelvic lymphadenopathy. Reproductive: Uterus is within normal limits. Bilateral ovaries are unremarkable, noting a right corpus luteum. Other: No abdominopelvic ascites. Musculoskeletal: Visualized osseous structures are within normal limits. IMPRESSION: 2.1 cm lateral right breast mass, likely corresponding to the patient's known newly diagnosed breast cancer. Associated right axillary and subpectoral nodal metastases. No evidence of distant metastases. Electronically Signed   By: SJulian HyM.D.   On: 05/29/2018 11:12   Nm Bone Scan Whole Body  Result Date: 05/29/2018 CLINICAL DATA:  Invasive RIGHT breast cancer, new diagnosis EXAM: NUCLEAR MEDICINE WHOLE BODY BONE SCAN TECHNIQUE: Whole body anterior and posterior images were obtained approximately 3 hours after intravenous injection of radiopharmaceutical. RADIOPHARMACEUTICALS:  21.8 mCi Technetium-9100mDP IV COMPARISON:  None Radiographic correlation: CT chest abdomen pelvis 05/29/2018 FINDINGS: Normal tracer localization throughout the axial and appendicular skeleton. Questionable subtle focus of increased tracer localization at the proximal to mid RIGHT  femoral diaphysis. No additional sites of abnormal osseous tracer accumulation are identified. Expected urinary tract and soft tissue distribution of tracer. IMPRESSION: Single questionable focus of abnormal osseous tracer accumulation at the proximal to mid RIGHT femoral diaphysis, metastatic focus not excluded.; Recommend radiographic correlation. Remainder of exam normal. Electronically Signed   By: Lavonia Dana M.D.   On: 05/29/2018 16:14   Ct Abdomen Pelvis W  Contrast  Result Date: 05/29/2018 CLINICAL DATA:  Newly diagnosed right breast cancer, for staging. Chemotherapy starting next week. EXAM: CT CHEST, ABDOMEN, AND PELVIS WITH CONTRAST TECHNIQUE: Multidetector CT imaging of the chest, abdomen and pelvis was performed following the standard protocol during bolus administration of intravenous contrast. CONTRAST:  188m OMNIPAQUE IOHEXOL 300 MG/ML  SOLN COMPARISON:  None. FINDINGS: CT CHEST FINDINGS Cardiovascular: The heart is normal in size. No pericardial effusion. No evidence of thoracic aortic aneurysm. Right chest port terminates in the lower SVC. Mediastinum/Nodes: Right axillary nodes measuring up to 10 mm short axis (series 2/image 18), suspicious. Additional right subpectoral nodes measuring up to 7 mm short axis (series 2/image 12), suspicious. No suspicious mediastinal or hilar lymphadenopathy. Visualized thyroid is unremarkable. Lungs/Pleura: Lungs are clear. No suspicious pulmonary nodules. No focal consolidation. No pleural effusion or pneumothorax. Musculoskeletal: 2.1 cm lateral right breast mass (series 2/image 22), likely corresponding to the patient's known newly diagnosed breast cancer. Visualized osseous structures are within normal limits. CT ABDOMEN PELVIS FINDINGS Hepatobiliary: Liver is within normal limits. Gallbladder is unremarkable. No intrahepatic or extrahepatic ductal dilatation. Pancreas: Within normal limits. Spleen: Within normal limits. Adrenals/Urinary Tract: Adrenal glands are within normal limits. Kidneys are within normal limits.  No hydronephrosis. Bladder is underdistended but unremarkable. Stomach/Bowel: Stomach is within normal limits. No evidence of bowel obstruction. Normal appendix (series 2/image 36). Vascular/Lymphatic: No evidence of abdominal aortic aneurysm. No suspicious abdominopelvic lymphadenopathy. Reproductive: Uterus is within normal limits. Bilateral ovaries are unremarkable, noting a right corpus luteum.  Other: No abdominopelvic ascites. Musculoskeletal: Visualized osseous structures are within normal limits. IMPRESSION: 2.1 cm lateral right breast mass, likely corresponding to the patient's known newly diagnosed breast cancer. Associated right axillary and subpectoral nodal metastases. No evidence of distant metastases. Electronically Signed   By: SJulian HyM.D.   On: 05/29/2018 11:12   Mr Breast Bilateral W [email or web address]  Addendum Date: 05/31/2018   ADDENDUM REPORT: 05/31/2018 09:37 ADDENDUM: Within the FINDINGS section of the RIGHT BREAST, the non masslike enhancement is located throughout the UPPER-OUTER RIGHT BREAST, instead of the LEFT breast as on the original report. Electronically Signed   By: JMargarette CanadaM.D.   On: 05/31/2018 09:37   Result Date: 05/31/2018 CLINICAL DATA:  42year old female with newly diagnosed invasive RIGHT breast cancer with metastasis to RIGHT axillary lymph node. LABS:  None performed today EXAM: BILATERAL BREAST MRI WITH AND WITHOUT CONTRAST TECHNIQUE: Multiplanar, multisequence MR images of both breasts were obtained prior to and following the intravenous administration of 7 ml of Gadavist Three-dimensional MR images were rendered by post-processing of the original MR data on an independent workstation. The three-dimensional MR images were interpreted, and findings are reported in the following complete MRI report for this study. Three dimensional images were evaluated at the independent DynaCad workstation COMPARISON:  Previous exam(s). FINDINGS: Breast composition: c. Heterogeneous fibroglandular tissue. Background parenchymal enhancement: Mild Right breast: A 1.7 x 3.7 cm enhancing mass within the UPPER-OUTER RIGHT breast contains biopsy clip artifact and compatible with biopsy-proven malignancy. Biopsy clip artifact within a 1.3  cm mass/lymph node in the axillary tail is compatible with biopsy-proven malignancy. Non masslike enhancement throughout the  UPPER OUTER LEFT breast has similar enhancement characteristics as the biopsy-proven breast malignancy, extending at least 5 cm inferomedially from the biopsy-proven malignancy. The entire area of non masslike enhancement and biopsy-proven malignancy measures 3.5 x 7 x 6.5 cm (transverse x AP x CC). No other areas of abnormal enhancement are noted. Left breast: No mass or abnormal enhancement. Lymph nodes: 7 abnormal appearing RIGHT axillary lymph nodes are identified in include level 1, level 2 and level 3 nodes. No other abnormal lymph nodes are identified. Ancillary findings:  None. IMPRESSION: 1. 3.7 cm biopsy-proven UPPER-OUTER RIGHT breast malignancy and 1.3 cm biopsy-proven malignancy/metastatic lymph node in the RIGHT axillary tail. 2. Abnormal non masslike enhancement throughout the UPPER-OUTER RIGHT breast with similar enhancement characteristics to the biopsy-proven malignancy. This non masslike enhancement extends 5 cm inferomedially from the biopsy-proven RIGHT breast malignancy, with the entire area including the biopsy-proven malignancy measuring 3.5 x 7 x 6.5 cm. Recommend tissue sampling of the anterior aspect of the suspicious non masslike enhancement if breast conservation is desired. 3. Seven abnormal appearing RIGHT axillary lymph nodes involving level 1, level 2 and level 3. 4. No MR evidence of LEFT breast malignancy. RECOMMENDATION: 1. If breast conservation is desired, recommend MR guided biopsy of the anterior aspect of UPPER-OUTER RIGHT breast non masslike enhancement. 2. Treatment plan otherwise. BI-RADS CATEGORY  4: Suspicious. Electronically Signed: By: Margarette Canada M.D. On: 05/28/2018 14:42   Dg Fluoro Guide Cv Line Right  Result Date: 05/28/2018 CLINICAL DATA:  Port catheter placement EXAM: CENTRAL VENOUS CATHETER WITH FLUOROSCOPY FINDINGS: Right IJ power injectable Port-A-Cath noted with tip over superior vena cava. No pneumothorax noted. Surgical sponge and clips noted over the  right lower chest. IMPRESSION: Port-A-Cath noted with tip over superior vena cava. Electronically Signed   By: Lucrezia Europe M.D.   On: 05/28/2018 13:51    ELIGIBLE FOR AVAILABLE RESEARCH PROTOCOL: Upbeat  ASSESSMENT: 42 y.o. [facility] woman, status post breast upper outer quadrant and right axillary lymph node biopsy 05/03/2018 for a clinical T2 N2, stage IIIC invasive ductal carcinoma, triple negative, with an MIB-1 of 75%  (a) staging studies with CT C/A/P on 05/29/18 shows no distant metastases; bone scan shows questionable focus of tracer accumulation on the proximal to mid right femoral diaphysis  (b) MRI breast on 05/31/18 shows additional non mass like enhancement throughout upper outer right breast that extends 5cm inferomedially from biopsy proven malignancy.    (1) genetics testing 06/06/2018  (2) neoadjuvant chemotherapy will consist of doxorubicin and cyclophosphamide in dose dense fashion x4 starting 06/03/2018 followed by paclitaxel and carboplatin weekly x12  (a) Echo on 05/23/2018 shows an EF of 60-65%.  (3) definitive surgery to follow  (4) adjuvant radiation to follow surgery  PLAN:  Darlene Grant is doing well today.  Her labs are stable.  We reviewed her chemotherapy.  She had not yet received the Dexamethasone at her pharmacy.  Grant sent this in today.  She has the map at home that guides her as to how to take it.  She has no questions about this.  She will proceed with her first cycle of neoadjuvant chemotherapy with Doxorubicin and Cyclophosphamide today.  She will return tomorrow afternoon for [employer].  We reviewed common adverse effects and Grant also met her husband [name] today in the lobby.  Darlene Grant reviewed her scans in detail.  There  is one area on her bone scan in her right femur that is indeterminate and requires a plain film correlate.  This has been ordered, and will be done tomorrow.    Grant reviewed her MRI with her.  Grant let her know that Dr. Jana Hakim and  Dr. Donne Hazel reviewed it and breast conservation is off the table for possibility for surgery.  She was already planning on bilateral mastectomies so she is ok with this.  She asked me if Grant thought whether or not Dr. Donne Hazel would call her and review a couple of things about her surgery or if she could see him after the holidays to review her additional surgery questions that she has.  Grant sent him a message with her requests.  For now, we will continue with her current treatment plan which is her neoadjuvant chemotherapy and decreasing her tumor burden.  Darlene Grant will return in 1 week for labs and f/u with Dr. Jana Hakim to evaluate how she has tolerated her chemotherapy.  She will call with any problems that may develop before her next visit here.  A total of (30) minutes of face-to-face time was spent with this patient with greater than 50% of that time in counseling and care-coordination.   Wilber Bihari, NP 06/03/18 8:57 AM Medical Oncology and Hematology [facility] [address] [place], [place] [address] Tel. [phone]    Fax. [phone]

## 2018-06-03 NOTE — Telephone Encounter (Signed)
Exercises were printed and were sent to patient.

## 2018-06-04 ENCOUNTER — Telehealth: Payer: Self-pay | Admitting: *Deleted

## 2018-06-04 ENCOUNTER — Inpatient Hospital Stay: Payer: BLUE CROSS/BLUE SHIELD

## 2018-06-04 VITALS — BP 102/58 | HR 79 | Temp 98.9°F | Resp 18

## 2018-06-04 DIAGNOSIS — C50411 Malignant neoplasm of upper-outer quadrant of right female breast: Secondary | ICD-10-CM | POA: Diagnosis not present

## 2018-06-04 DIAGNOSIS — Z452 Encounter for adjustment and management of vascular access device: Secondary | ICD-10-CM | POA: Diagnosis not present

## 2018-06-04 DIAGNOSIS — Z5111 Encounter for antineoplastic chemotherapy: Secondary | ICD-10-CM | POA: Diagnosis not present

## 2018-06-04 DIAGNOSIS — C773 Secondary and unspecified malignant neoplasm of axilla and upper limb lymph nodes: Secondary | ICD-10-CM | POA: Diagnosis not present

## 2018-06-04 DIAGNOSIS — Z171 Estrogen receptor negative status [ER-]: Secondary | ICD-10-CM

## 2018-06-04 DIAGNOSIS — Z5189 Encounter for other specified aftercare: Secondary | ICD-10-CM | POA: Diagnosis not present

## 2018-06-04 DIAGNOSIS — R51 Headache: Secondary | ICD-10-CM | POA: Diagnosis not present

## 2018-06-04 MED ORDER — PEGFILGRASTIM-CBQV 6 MG/0.6ML ~~LOC~~ SOSY
PREFILLED_SYRINGE | SUBCUTANEOUS | Status: AC
  Filled 2018-06-04: qty 0.6

## 2018-06-04 MED ORDER — PEGFILGRASTIM-CBQV 6 MG/0.6ML ~~LOC~~ SOSY
6.0000 mg | PREFILLED_SYRINGE | Freq: Once | SUBCUTANEOUS | Status: AC
  Administered 2018-06-04: 6 mg via SUBCUTANEOUS

## 2018-06-04 NOTE — Telephone Encounter (Addendum)
-----   Message from Johann Capers, RN sent at 06/03/2018  2:49 PM EST ----- Regarding: [facility] - Chemo F/U [facility] - First time adria/cytoxan f/u call.  This RN called pt for post chemo follow up. Obtained identified VM- general message left stating calling as post therapy call.  This RN's name and office left for return call if needed.

## 2018-06-04 NOTE — Patient Instructions (Signed)
Pegfilgrastim injection What is this medicine? PEGFILGRASTIM (PEG fil gra stim) is a long-acting granulocyte colony-stimulating factor that stimulates the growth of neutrophils, a type of white blood cell important in the body's fight against infection. It is used to reduce the incidence of fever and infection in patients with certain types of cancer who are receiving chemotherapy that affects the bone marrow, and to increase survival after being exposed to high doses of radiation. This medicine may be used for other purposes; ask your health care provider or pharmacist if you have questions. COMMON BRAND NAME(S): Neulasta,Udenyca  What should I tell my health care provider before I take this medicine? They need to know if you have any of these conditions: -kidney disease -latex allergy -ongoing radiation therapy -sickle cell disease -skin reactions to acrylic adhesives (On-Body Injector only) -an unusual or allergic reaction to pegfilgrastim, filgrastim, other medicines, foods, dyes, or preservatives -pregnant or trying to get pregnant -breast-feeding How should I use this medicine? This medicine is for injection under the skin. If you get this medicine at home, you will be taught how to prepare and give the pre-filled syringe or how to use the On-body Injector. Refer to the patient Instructions for Use for detailed instructions. Use exactly as directed. Tell your healthcare provider immediately if you suspect that the On-body Injector may not have performed as intended or if you suspect the use of the On-body Injector resulted in a missed or partial dose. It is important that you put your used needles and syringes in a special sharps container. Do not put them in a trash can. If you do not have a sharps container, call your pharmacist or healthcare provider to get one. Talk to your pediatrician regarding the use of this medicine in children. While this drug may be prescribed for selected  conditions, precautions do apply. Overdosage: If you think you have taken too much of this medicine contact a poison control center or emergency room at once. NOTE: This medicine is only for you. Do not share this medicine with others. What if I miss a dose? It is important not to miss your dose. Call your doctor or health care professional if you miss your dose. If you miss a dose due to an On-body Injector failure or leakage, a new dose should be administered as soon as possible using a single prefilled syringe for manual use. What may interact with this medicine? Interactions have not been studied. Give your health care provider a list of all the medicines, herbs, non-prescription drugs, or dietary supplements you use. Also tell them if you smoke, drink alcohol, or use illegal drugs. Some items may interact with your medicine. This list may not describe all possible interactions. Give your health care provider a list of all the medicines, herbs, non-prescription drugs, or dietary supplements you use. Also tell them if you smoke, drink alcohol, or use illegal drugs. Some items may interact with your medicine. What should I watch for while using this medicine? You may need blood work done while you are taking this medicine. If you are going to need a MRI, CT scan, or other procedure, tell your doctor that you are using this medicine (On-Body Injector only). What side effects may I notice from receiving this medicine? Side effects that you should report to your doctor or health care professional as soon as possible: -allergic reactions like skin rash, itching or hives, swelling of the face, lips, or tongue -dizziness -fever -pain, redness, or irritation at   site where injected -pinpoint red spots on the skin -red or dark-brown urine -shortness of breath or breathing problems -stomach or side pain, or pain at the shoulder -swelling -tiredness -trouble passing urine or change in the amount of  urine Side effects that usually do not require medical attention (report to your doctor or health care professional if they continue or are bothersome): -bone pain -muscle pain This list may not describe all possible side effects. Call your doctor for medical advice about side effects. You may report side effects to FDA at 1-800-FDA-1088. Where should I keep my medicine? Keep out of the reach of children. Store pre-filled syringes in a refrigerator between 2 and 8 degrees C (36 and 46 degrees F). Do not freeze. Keep in carton to protect from light. Throw away this medicine if it is left out of the refrigerator for more than 48 hours. Throw away any unused medicine after the expiration date. NOTE: This sheet is a summary. It may not cover all possible information. If you have questions about this medicine, talk to your doctor, pharmacist, or health care provider.  2018 Elsevier/Gold Standard (2016-06-01 12:58:03)  

## 2018-06-06 ENCOUNTER — Telehealth: Payer: Self-pay | Admitting: *Deleted

## 2018-06-06 ENCOUNTER — Other Ambulatory Visit: Payer: BLUE CROSS/BLUE SHIELD

## 2018-06-06 ENCOUNTER — Encounter: Payer: BLUE CROSS/BLUE SHIELD | Admitting: Licensed Clinical Social Worker

## 2018-06-06 NOTE — Telephone Encounter (Signed)
Darlene Grant [phone]).  "I'm just calling to get a call back from nurse triage about some symptoms that I'm having just to see what kind of medications I can take at home."  "Calling to see about taking some over the counter medications with some symptoms I'm having.  Is it safe to do that.  Just started chemotherapy this week so I wanted to check."  No further symptom information provided with calls.  "Diarrhea for two days.  Really bad joint pain, especially to my chest. Hurts to pick up my 2-yr old daughter.  Claritin used but I normally use Tylenol.  Can Tylenol or Ibuprofen?  Be used?   Temperature not elevated = 98.6  F.   Diarrhea is slowing down, started yesterday.  No bowel movement the past three hours.  Seven or eight stools yesterday.  Five stools today.  Can I use the Imodium I have on hand?    Drinking 64 oz Gatorade and water daily.  Eating a lighter than usual.  No N/V.  I do not eat fried, greasy, spicy foods."   Viewed medication list, pre-treatment lab results.  Darlene Grant denies history of restricted use of requested OTC medications.   Will use Tylenol if needed.  Imodium if diarrhea returns.  Asked to call [place] in am with update.  Agrees and able to come in tomorrow am if [facility] visit needed.

## 2018-06-07 NOTE — Telephone Encounter (Signed)
No Triage call received to date from [facility].  Message left requesting return call to F/U with yesterday's reported diarrhea.

## 2018-06-09 NOTE — Progress Notes (Signed)
Darlene Grant  [phone] [phone]     ID: Darlene Grant DOB: Aug 04, 1976  MR#: [id number]  PPI#:[id number]  Patient Care Team: Lavada Mesi as PCP - General (Family Medicine) Rolm Bookbinder, MD as Consulting Physician (General Surgery) Magrinat, Virgie Dad, MD as Consulting Physician (Oncology) Gery Pray, MD as Consulting Physician (Radiation Oncology) Emily Filbert, MD as Consulting Physician (Obstetrics and Gynecology) Lavonna Monarch, MD as Consulting Physician (Dermatology) OTHER MD:   CHIEF COMPLAINT: Triple negative breast cancer  CURRENT TREATMENT: Neoadjuvant chemotherapy   HISTORY OF CURRENT ILLNESS: From the original intake note:  Darlene Grant had routine screening mammography on 04/19/2018 showing a possible abnormality in the right breast. She underwent unilateral right diagnostic mammography with tomography and right breast ultrasonography at [facility] on 05/02/2018 showing: Breast Density Category C: There was an oval circumscribed mass in the posterior aspect of the upper outer right breast.  On exam this measured 3.5 cm, was firm, irregular and centered on 10-11 o'clock 5 cm from the nipple.  By ultrasonography there was a 3.7 x 1.7 x 1.5 cm elongated, irregular, hypoechoic mass in the 10:30 o'clock position of the right breast, 5 cm from the nipple.  Ultrasound of the right axilla showed 7 abnormal right axillary lymph nodes highly suspicious for metastatic nodes.  Accordingly on 05/03/2018 she proceeded to biopsy of the right breast area in question and one suspicious lymph node. The pathology from this procedure showed ([id number]): Right Breast: invasive mammary carcinoma, grade 3, E-cadherin positive (and therefore ductal) lymphovascular invasion present. Lymph node: Invasive mammary carcinoma Prognostic indicators significant for: estrogen receptor, 0% negative and progesterone  receptor, 0% negative. Proliferation marker Ki67 at 75%. HER2 negative (1+).  The patient's subsequent history is as detailed below.  INTERVAL HISTORY: [name] returns today for follow-up of her triple negative breast cancer.  The patient continues on neoadjuvant chemotherapy, consisting of doxorubicin and cyclophosphamide in dose dense fashion x4. Today is day 8 cycle 1. She experienced a lot of nausea on the first two days without vomiting; the nausea declined in severity, but it is unresolved. She had to take the compozine early, which helped some, but not completely. She also experienced constipation on 05/04/2018 and 05/05/2018 and then two days of diarrhea immediately following. She has no sores in her mouth. Her taste buds are changing some; she has lost her taste for coffee, which she would previously drink a lot of. For her symptoms, she says that she "feels like she did when she was pregnant."  Since her last visit here on 06/03/2018, she has not undergone any procedures.    REVIEW OF SYSTEMS: [place] is doing well despite the symptoms from her chemotherapy. She worked out three days since her initial treatment. She has had some help with the kids on days that were particularly bad. She is trying to eat small meals. The patient denies unusual headaches, visual changes, vomiting, or dizziness. There has been no unusual cough, phlegm production, or pleurisy. This been no change in bladder habits. The patient denies unexplained weight loss, bleeding, rash, or fever. A detailed review of systems was otherwise noncontributory.    PAST MEDICAL HISTORY: Past Medical History:  Diagnosis Date  . Abnormal Pap smear    ASCUS 03/13/11  . Anemia   . Breast pain, left   . Cancer ([place])   . Candida vaginitis   . Complication of anesthesia    pt states that all narcotics make  her angry and she would prefer to avoid them.  . Rectal itching   . Vaginal Pap smear, abnormal     PAST SURGICAL  HISTORY: Past Surgical History:  Procedure Laterality Date  . BREAST BIOPSY Right 8/16  . CESAREAN SECTION N/A 11/21/2013   Procedure: CESAREAN SECTION;  Surgeon: Alwyn Pea, MD;  Location: [facility];  Service: Obstetrics;  Laterality: N/A;  . CESAREAN SECTION N/A 06/23/2016   Procedure: CESAREAN SECTION;  Surgeon: Emily Filbert, MD;  Location: [facility];  Service: Obstetrics;  Laterality: N/A;  . HAND SURGERY  04/2012   ligament repair  . PILONIDAL CYST EXCISION  2000  . PORTACATH PLACEMENT N/A 05/28/2018   Procedure: INSERTION PORT-A-CATH WITH ULTRASOUND;  Surgeon: Rolm Bookbinder, MD;  Location: [facility];  Service: General;  Laterality: N/A;  . WISDOM TOOTH EXTRACTION  AGE 40    FAMILY HISTORY Family History  Problem Relation Age of Onset  . Cancer Mother 49       breast, premenopausal  . Breast cancer Mother   . Cancer Maternal Grandmother        skin, stomach  . Cancer Maternal Grandfather        lung    As of November 2019 her father is 71 years old with a history of Parkinson's. Patients' mother is 29 years old with a history of breast cancer, diagnosed age 13, treated with radical mastectomy. The patient has no brothers and two sisters. Besides the patient's mother, the patient has no other family medical history of breast or ovarian cancer. Her maternal grandmother had stomach cancer and her maternal grandfather had lung cancer, --he was a smoker.    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  Her husband, [name], an Chief Financial Officer. She has two children, Darlene [facility], age 47, and Darlene [facility], age 34 months.  The patient is not a smoker. She does drink alcohol and drinks an estimated one to two  drinks per week.        ADVANCED DIRECTIVES: [name], her husband, is her healthcare department  HEALTH MAINTENANCE: Social History   Tobacco Use  . Smoking status: Never Smoker  . Smokeless tobacco: Never Used  Substance Use Topics  . Alcohol use: Yes    Alcohol/week: 0.0 standard drinks    Comment: occassionally  . Drug use: No     Colonoscopy: no  PAP: 12/2017  Bone density: no   Allergies  Allergen Reactions  . Augmentin [Amoxicillin-Pot Clavulanate]     diarrhea  . Hydrocodone Itching  . Keflex [Cephalexin] Diarrhea    Current Outpatient Medications  Medication Sig Dispense Refill  . AZO-CRANBERRY PO Take by mouth.    . Black Elderberry 50 MG/5ML SYRP Take by mouth.    . cyclobenzaprine (FLEXERIL) 10 MG tablet Take 0.5-1 tablets (5-10 mg total) by mouth 3 (three) times daily as needed for muscle spasms. 30 tablet 1  . dexamethasone (DECADRON) 4 MG tablet Take 2 tablets by mouth once a day on the day after chemotherapy and then take 2 tablets two times a day for 2 days. Take with  food. 30 tablet 1  . lidocaine-prilocaine (EMLA) cream Apply to affected area once 30 g 3  . loratadine (CLARITIN) 10 MG tablet Take 1 tablet (10 mg total) by mouth daily. 90 tablet 0  . LORazepam (ATIVAN) 0.5 MG tablet Take 1 tablet (0.5 mg total) by mouth at bedtime as needed (Nausea or vomiting). 30 tablet 0  . Probiotic Product (PROBIOTIC DAILY PO) Take 1 tablet by mouth.    . prochlorperazine (COMPAZINE) 10 MG tablet Take 1 tablet (10 mg total) by mouth every 6 (six) hours as needed (Nausea or vomiting). 30 tablet 1  . traMADol (ULTRAM) 50 MG tablet Take 1 tablet (50 mg total) by mouth every 6 (six) hours as needed. (Patient not taking: Reported on 06/03/2018) 6 tablet 0   No current facility-administered medications for this visit.     OBJECTIVE: Young white woman in no acute distress  Vitals:   06/10/18 1418  BP: 117/80  Pulse: 89  Resp: 17  Temp: 98.2 F (36.8 C)  SpO2: 99%      Body mass index is 24.46 kg/m.   Wt Readings from Last 3 Encounters:  06/10/18 143 lb 9.6 oz (65.1 kg)  06/03/18 143 lb 1.6 oz (64.9 kg)  05/28/18 141 lb 8.6 oz (64.2 kg)  ECOG FS:0 - Asymptomatic  Sclerae unicteric, EOMs intact No cervical or supraclavicular adenopathy Lungs no rales or rhonchi Heart regular rate and rhythm Abd soft, nontender, positive bowel sounds MSK no focal spinal tenderness, no upper extremity lymphedema Neuro: nonfocal, well oriented, appropriate affect Breasts: I can palpate the edge of the mass in the upper outer quadrant of the right breast.  There are no skin changes or nipple changes of concern.  The left breast is benign.  Both axillae are benign     LAB RESULTS:  CMP     Component Value Date/Time   NA 138 06/10/2018 1407   K 4.3 06/10/2018 1407   CL 103 06/10/2018 1407   CO2 28 06/10/2018 1407   GLUCOSE 98 06/10/2018 1407   BUN 14 06/10/2018 1407   CREATININE 0.66 06/10/2018 1407   CREATININE 0.76 05/15/2018 0827   CREATININE 0.78 01/15/2018 1033   CALCIUM 9.3 06/10/2018 1407   PROT 6.6 06/10/2018 1407   ALBUMIN 4.0 06/10/2018 1407   AST 10 (L) 06/10/2018 1407   AST 10 (L) 05/15/2018 0827   ALT 24 06/10/2018 1407   ALT 9 05/15/2018 0827   ALKPHOS 74 06/10/2018 1407   BILITOT 0.8 06/10/2018 1407   BILITOT 1.1 05/15/2018 0827   GFRNONAA >60 06/10/2018 1407   GFRNONAA >60 05/15/2018 0827   GFRNONAA 95 01/15/2018 1033   GFRAA >60 06/10/2018 1407   GFRAA >60 05/15/2018 0827   GFRAA 110 01/15/2018 1033    No results found for: TOTALPROTELP, ALBUMINELP, A1GS, A2GS, BETS, BETA2SER, GAMS, MSPIKE, SPEI  No results found for: KPAFRELGTCHN, LAMBDASER, KAPLAMBRATIO  Lab Results  Component Value Date   WBC 1.6 (L) 06/10/2018   NEUTROABS PENDING 06/10/2018   HGB 11.1 (L) 06/10/2018   HCT 32.7 (L) 06/10/2018   MCV 91.3 06/10/2018   PLT 164 06/10/2018    _0 @  No results found for: LABCA2  No components found for:  [id number]  No results for input(s): INR in the last 168 hours.  No results found for: LABCA2  No results found for: TGG269  No results found for: SWN462  No results found for: VOJ500  No results found for: CA2729  No components found for:  HGQUANT  No results found for: CEA1 / No results found for: CEA1   No results found for: AFPTUMOR  No results found for: [place]  No results found for: PSA1  Appointment on 06/10/2018  Component Date Value Ref Range Status  . Sodium 06/10/2018 138  135 - 145 mmol/L Final  . Potassium 06/10/2018 4.3  3.5 - 5.1 mmol/L Final  . Chloride 06/10/2018 103  98 - 111 mmol/L Final  . CO2 06/10/2018 28  22 - 32 mmol/L Final  . Glucose, Bld 06/10/2018 98  70 - 99 mg/dL Final  . BUN 06/10/2018 14  6 - 20 mg/dL Final  . Creatinine, Ser 06/10/2018 0.66  0.44 - 1.00 mg/dL Final  . Calcium 06/10/2018 9.3  8.9 - 10.3 mg/dL Final  . Total Protein 06/10/2018 6.6  6.5 - 8.1 g/dL Final  . Albumin 06/10/2018 4.0  3.5 - 5.0 g/dL Final  . AST 06/10/2018 10* 15 - 41 U/L Final  . ALT 06/10/2018 24  0 - 44 U/L Final  . Alkaline Phosphatase 06/10/2018 74  38 - 126 U/L Final  . Total Bilirubin 06/10/2018 0.8  0.3 - 1.2 mg/dL Final  . GFR calc non Af Amer 06/10/2018 >60  >60 mL/min Final  . GFR calc Af Amer 06/10/2018 >60  >60 mL/min Final  . Anion gap 06/10/2018 7  5 - 15 Final   Performed at [facility], [place] [address], [place], [place] [address]  . WBC 06/10/2018 1.6* 4.0 - 10.5 K/uL Final  . RBC 06/10/2018 3.58* 3.87 - 5.11 MIL/uL Final  . Hemoglobin 06/10/2018 11.1* 12.0 - 15.0 g/dL Final  . HCT 06/10/2018 32.7* 36.0 - 46.0 % Final  . MCV 06/10/2018 91.3  80.0 - 100.0 fL Final  . MCH 06/10/2018 31.0  26.0 - 34.0 pg Final  . MCHC 06/10/2018 33.9  30.0 - 36.0 g/dL Final  . RDW 06/10/2018 12.0  11.5 - 15.5 % Final  . Platelets 06/10/2018 164  150 - 400 K/uL Final  . nRBC 06/10/2018 0.0  0.0 - 0.2 % Final   Performed at [facility], [place] [address] [place], [place] [address]  . Neutrophils Relative % 06/10/2018 PENDING  % Incomplete  . Neutro Abs 06/10/2018 PENDING  1.7 - 7.7 K/uL Incomplete  . Band Neutrophils 06/10/2018 PENDING  % Incomplete  . Lymphocytes Relative 06/10/2018 PENDING  % Incomplete  . Lymphs Abs 06/10/2018 PENDING  0.7 - 4.0 K/uL Incomplete  . Monocytes Relative 06/10/2018 PENDING  % Incomplete  . Monocytes Absolute 06/10/2018 PENDING  0.1 - 1.0 K/uL Incomplete  . Eosinophils Relative 06/10/2018 PENDING  % Incomplete  . Eosinophils Absolute 06/10/2018 PENDING  0.0 - 0.5 K/uL Incomplete  . Basophils Relative 06/10/2018 PENDING  % Incomplete  . Basophils Absolute 06/10/2018 PENDING  0.0 - 0.1 K/uL Incomplete  . WBC Morphology 06/10/2018 PENDING   Incomplete  . RBC Morphology 06/10/2018 PENDING   Incomplete  . Smear Review 06/10/2018 PENDING   Incomplete  . Other 06/10/2018 PENDING  % Incomplete  . nRBC 06/10/2018 PENDING  0 /100 WBC Incomplete  . Metamyelocytes Relative 06/10/2018 PENDING  % Incomplete  . Myelocytes 06/10/2018 PENDING  % Incomplete  . Promyelocytes Relative 06/10/2018 PENDING  % Incomplete  . Blasts 06/10/2018 PENDING  % Incomplete    (this displays the last labs from the last 3 days)  No results found for: TOTALPROTELP, ALBUMINELP, A1GS, A2GS, BETS, BETA2SER, GAMS, MSPIKE, SPEI (this displays SPEP  labs)  No results found for: KPAFRELGTCHN, LAMBDASER, KAPLAMBRATIO (kappa/lambda light chains)  No results found for: HGBA, HGBA2QUANT, HGBFQUANT, HGBSQUAN (Hemoglobinopathy evaluation)   No results found for: LDH  Lab Results  Component Value Date   IRON 59 12/27/2016   TIBC 329 12/27/2016   IRONPCTSAT 18 12/27/2016   (Iron and TIBC)  Lab Results  Component Value Date   FERRITIN 24 01/15/2018    Urinalysis    Component Value Date/Time   COLORURINE YELLOW 12/18/2016 [place] 12/18/2016 1607   LABSPEC 1.010  12/18/2016 1607   PHURINE 7.0 12/18/2016 1607   GLUCOSEU NEGATIVE 12/18/2016 1607   HGBUR NEGATIVE 12/18/2016 1607   HGBUR negative 01/13/2010 0940   BILIRUBINUR NEGATIVE 12/18/2016 1607   BILIRUBINUR neg 05/17/2015 1034   KETONESUR NEGATIVE 12/18/2016 1607   PROTEINUR NEGATIVE 12/18/2016 1607   UROBILINOGEN 0.2 05/17/2015 1034   UROBILINOGEN 0.2 01/13/2010 0940   NITRITE NEGATIVE 12/18/2016 1607   LEUKOCYTESUR NEGATIVE 12/18/2016 1607     STUDIES:  Dg Chest 1 View  Result Date: 05/28/2018 CLINICAL DATA:  Right-sided Port-A-Cath insertion. EXAM: CHEST  1 VIEW COMPARISON:  No recent prior. FINDINGS: Port-A-Cath noted with tip over superior vena cava. No pneumothorax noted. Surgical sponge and clips noted over the right lower chest. IMPRESSION: Port-A-Cath noted with tip over superior vena cava. Electronically Signed   By: Marcello Moores  Register   On: 05/28/2018 13:00   Ct Chest W Contrast  Result Date: 05/29/2018 CLINICAL DATA:  Newly diagnosed right breast cancer, for staging. Chemotherapy starting next week. EXAM: CT CHEST, ABDOMEN, AND PELVIS WITH CONTRAST TECHNIQUE: Multidetector CT imaging of the chest, abdomen and pelvis was performed following the standard protocol during bolus administration of intravenous contrast. CONTRAST:  165m OMNIPAQUE IOHEXOL 300 MG/ML  SOLN COMPARISON:  None. FINDINGS: CT CHEST FINDINGS Cardiovascular: The heart is normal in size. No pericardial effusion. No evidence of thoracic aortic aneurysm. Right chest port terminates in the lower SVC. Mediastinum/Nodes: Right axillary nodes measuring up to 10 mm short axis (series 2/image 18), suspicious. Additional right subpectoral nodes measuring up to 7 mm short axis (series 2/image 12), suspicious. No suspicious mediastinal or hilar lymphadenopathy. Visualized thyroid is unremarkable. Lungs/Pleura: Lungs are clear. No suspicious pulmonary nodules. No focal consolidation. No pleural effusion or pneumothorax.  Musculoskeletal: 2.1 cm lateral right breast mass (series 2/image 22), likely corresponding to the patient's known newly diagnosed breast cancer. Visualized osseous structures are within normal limits. CT ABDOMEN PELVIS FINDINGS Hepatobiliary: Liver is within normal limits. Gallbladder is unremarkable. No intrahepatic or extrahepatic ductal dilatation. Pancreas: Within normal limits. Spleen: Within normal limits. Adrenals/Urinary Tract: Adrenal glands are within normal limits. Kidneys are within normal limits.  No hydronephrosis. Bladder is underdistended but unremarkable. Stomach/Bowel: Stomach is within normal limits. No evidence of bowel obstruction. Normal appendix (series 2/image 36). Vascular/Lymphatic: No evidence of abdominal aortic aneurysm. No suspicious abdominopelvic lymphadenopathy. Reproductive: Uterus is within normal limits. Bilateral ovaries are unremarkable, noting a right corpus luteum. Other: No abdominopelvic ascites. Musculoskeletal: Visualized osseous structures are within normal limits. IMPRESSION: 2.1 cm lateral right breast mass, likely corresponding to the patient's known newly diagnosed breast cancer. Associated right axillary and subpectoral nodal metastases. No evidence of distant metastases. Electronically Signed   By: SJulian HyM.D.   On: 05/29/2018 11:12   Nm Bone Scan Whole Body  Result Date: 05/29/2018 CLINICAL DATA:  Invasive RIGHT breast cancer, new diagnosis EXAM: NUCLEAR MEDICINE WHOLE BODY BONE SCAN TECHNIQUE: Whole body anterior and posterior  images were obtained approximately 3 hours after intravenous injection of radiopharmaceutical. RADIOPHARMACEUTICALS:  21.8 mCi Technetium-62mMDP IV COMPARISON:  None Radiographic correlation: CT chest abdomen pelvis 05/29/2018 FINDINGS: Normal tracer localization throughout the axial and appendicular skeleton. Questionable subtle focus of increased tracer localization at the proximal to mid RIGHT femoral diaphysis. No  additional sites of abnormal osseous tracer accumulation are identified. Expected urinary tract and soft tissue distribution of tracer. IMPRESSION: Single questionable focus of abnormal osseous tracer accumulation at the proximal to mid RIGHT femoral diaphysis, metastatic focus not excluded.; Recommend radiographic correlation. Remainder of exam normal. Electronically Signed   By: MLavonia DanaM.D.   On: 05/29/2018 16:14   Ct Abdomen Pelvis W Contrast  Result Date: 05/29/2018 CLINICAL DATA:  Newly diagnosed right breast cancer, for staging. Chemotherapy starting next week. EXAM: CT CHEST, ABDOMEN, AND PELVIS WITH CONTRAST TECHNIQUE: Multidetector CT imaging of the chest, abdomen and pelvis was performed following the standard protocol during bolus administration of intravenous contrast. CONTRAST:  1035mOMNIPAQUE IOHEXOL 300 MG/ML  SOLN COMPARISON:  None. FINDINGS: CT CHEST FINDINGS Cardiovascular: The heart is normal in size. No pericardial effusion. No evidence of thoracic aortic aneurysm. Right chest port terminates in the lower SVC. Mediastinum/Nodes: Right axillary nodes measuring up to 10 mm short axis (series 2/image 18), suspicious. Additional right subpectoral nodes measuring up to 7 mm short axis (series 2/image 12), suspicious. No suspicious mediastinal or hilar lymphadenopathy. Visualized thyroid is unremarkable. Lungs/Pleura: Lungs are clear. No suspicious pulmonary nodules. No focal consolidation. No pleural effusion or pneumothorax. Musculoskeletal: 2.1 cm lateral right breast mass (series 2/image 22), likely corresponding to the patient's known newly diagnosed breast cancer. Visualized osseous structures are within normal limits. CT ABDOMEN PELVIS FINDINGS Hepatobiliary: Liver is within normal limits. Gallbladder is unremarkable. No intrahepatic or extrahepatic ductal dilatation. Pancreas: Within normal limits. Spleen: Within normal limits. Adrenals/Urinary Tract: Adrenal glands are within normal  limits. Kidneys are within normal limits.  No hydronephrosis. Bladder is underdistended but unremarkable. Stomach/Bowel: Stomach is within normal limits. No evidence of bowel obstruction. Normal appendix (series 2/image 36). Vascular/Lymphatic: No evidence of abdominal aortic aneurysm. No suspicious abdominopelvic lymphadenopathy. Reproductive: Uterus is within normal limits. Bilateral ovaries are unremarkable, noting a right corpus luteum. Other: No abdominopelvic ascites. Musculoskeletal: Visualized osseous structures are within normal limits. IMPRESSION: 2.1 cm lateral right breast mass, likely corresponding to the patient's known newly diagnosed breast cancer. Associated right axillary and subpectoral nodal metastases. No evidence of distant metastases. Electronically Signed   By: SrJulian Hy.D.   On: 05/29/2018 11:12   Mr Breast Bilateral W WoBoscobelad  Addendum Date: 05/31/2018   ADDENDUM REPORT: 05/31/2018 09:37 ADDENDUM: Within the FINDINGS section of the RIGHT BREAST, the non masslike enhancement is located throughout the UPPER-OUTER RIGHT BREAST, instead of the LEFT breast as on the original report. Electronically Signed   By: JeMargarette Canada.D.   On: 05/31/2018 09:37   Result Date: 05/31/2018 CLINICAL DATA:  [age]ear old female with newly diagnosed invasive RIGHT breast cancer with metastasis to RIGHT axillary lymph node. LABS:  None performed today EXAM: BILATERAL BREAST MRI WITH AND WITHOUT CONTRAST TECHNIQUE: Multiplanar, multisequence MR images of both breasts were obtained prior to and following the intravenous administration of 7 ml of Gadavist Three-dimensional MR images were rendered by post-processing of the original MR data on an independent workstation. The three-dimensional MR images were interpreted, and findings are reported in the following complete MRI report for this study. Three dimensional images  were evaluated at the independent DynaCad workstation COMPARISON:   Previous exam(s). FINDINGS: Breast composition: c. Heterogeneous fibroglandular tissue. Background parenchymal enhancement: Mild Right breast: A 1.7 x 3.7 cm enhancing mass within the UPPER-OUTER RIGHT breast contains biopsy clip artifact and compatible with biopsy-proven malignancy. Biopsy clip artifact within a 1.3 cm mass/lymph node in the axillary tail is compatible with biopsy-proven malignancy. Non masslike enhancement throughout the UPPER OUTER LEFT breast has similar enhancement characteristics as the biopsy-proven breast malignancy, extending at least 5 cm inferomedially from the biopsy-proven malignancy. The entire area of non masslike enhancement and biopsy-proven malignancy measures 3.5 x 7 x 6.5 cm (transverse x AP x CC). No other areas of abnormal enhancement are noted. Left breast: No mass or abnormal enhancement. Lymph nodes: 7 abnormal appearing RIGHT axillary lymph nodes are identified in include level 1, level 2 and level 3 nodes. No other abnormal lymph nodes are identified. Ancillary findings:  None. IMPRESSION: 1. 3.7 cm biopsy-proven UPPER-OUTER RIGHT breast malignancy and 1.3 cm biopsy-proven malignancy/metastatic lymph node in the RIGHT axillary tail. 2. Abnormal non masslike enhancement throughout the UPPER-OUTER RIGHT breast with similar enhancement characteristics to the biopsy-proven malignancy. This non masslike enhancement extends 5 cm inferomedially from the biopsy-proven RIGHT breast malignancy, with the entire area including the biopsy-proven malignancy measuring 3.5 x 7 x 6.5 cm. Recommend tissue sampling of the anterior aspect of the suspicious non masslike enhancement if breast conservation is desired. 3. Seven abnormal appearing RIGHT axillary lymph nodes involving level 1, level 2 and level 3. 4. No MR evidence of LEFT breast malignancy. RECOMMENDATION: 1. If breast conservation is desired, recommend MR guided biopsy of the anterior aspect of UPPER-OUTER RIGHT breast non  masslike enhancement. 2. Treatment plan otherwise. BI-RADS CATEGORY  4: Suspicious. Electronically Signed: By: Margarette Canada M.D. On: 05/28/2018 14:42   Dg Fluoro Guide Cv Line Right  Result Date: 05/28/2018 CLINICAL DATA:  Port catheter placement EXAM: CENTRAL VENOUS CATHETER WITH FLUOROSCOPY FINDINGS: Right IJ power injectable Port-A-Cath noted with tip over superior vena cava. No pneumothorax noted. Surgical sponge and clips noted over the right lower chest. IMPRESSION: Port-A-Cath noted with tip over superior vena cava. Electronically Signed   By: Lucrezia Europe M.D.   On: 05/28/2018 13:51    ELIGIBLE FOR AVAILABLE RESEARCH PROTOCOL: Upbeat  ASSESSMENT: 42 y.o. 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    (1) genetics testing 06/06/2018  (2) neoadjuvant chemotherapy consisting of doxorubicin and cyclophosphamide in dose dense fashion x4 started 06/03/2018, to be  followed by paclitaxel and carboplatin weekly x12 (a) Echo on 05/23/2018 shows an EF of 60-65%.  (3) definitive surgery to follow (patient is planning on bilateral mastectomies)  (4) adjuvant radiation to follow surgery  PLAN:  Hilary tolerated her first cycle of chemotherapy moderately well, but I think we can make the next 3 wants better.  Note that she only took 4 mg instead of 8 mg of dexamethasone on days 2 and 3 because she gets  so revved up with the steroids.  This  means we cannot really make many changes to the premeds, which are causing her some headache and constipation.  For the pain of headaches and also the pain associated with her Neulasta she will take Tylenol and Aleve together up to 3 times a day as needed.  On day 1 of chemo she will start MiraLAX daily until she has normal bowel movements.  On the day of chemo she will also take lorazepam on the way here.  Right after putting on the EMLA cream she should take the lorazepam.  The purpose is to interrupt the associative nausea she is beginning to develop.  On the other hand she is doing a great job of keeping herself hydrated and continuing to exercise  She will take ciprofloxacin twice daily for the next 5 days  Otherwise she will return to see [place] in a week for cycle #2.  She knows to call for any other problems that may develop before that visit.  Magrinat, Virgie Dad, MD  06/10/18 2:53 PM Medical Oncology and Hematology [facility] [address] [place], [place] [address] Tel. [phone]    Fax. [phone]   I, Jacqualyn Posey am acting as a Education administrator for Chauncey Cruel, MD.   I, Lurline Del MD, have reviewed the above documentation for accuracy and completeness, and I agree with the above.

## 2018-06-10 ENCOUNTER — Telehealth: Payer: Self-pay | Admitting: Oncology

## 2018-06-10 ENCOUNTER — Inpatient Hospital Stay: Payer: BLUE CROSS/BLUE SHIELD

## 2018-06-10 ENCOUNTER — Ambulatory Visit (HOSPITAL_COMMUNITY)
Admission: RE | Admit: 2018-06-10 | Discharge: 2018-06-10 | Disposition: A | Discharge status: Home / Self Care | Payer: BLUE CROSS/BLUE SHIELD | Source: Ambulatory Visit | Attending: Oncology | Admitting: Oncology

## 2018-06-10 ENCOUNTER — Inpatient Hospital Stay (HOSPITAL_BASED_OUTPATIENT_CLINIC_OR_DEPARTMENT_OTHER): Payer: BLUE CROSS/BLUE SHIELD | Admitting: Oncology

## 2018-06-10 ENCOUNTER — Other Ambulatory Visit: Payer: Self-pay | Admitting: Oncology

## 2018-06-10 VITALS — BP 117/80 | HR 89 | Temp 98.2°F | Resp 17 | Ht 64.25 in | Wt 143.6 lb

## 2018-06-10 DIAGNOSIS — R948 Abnormal results of function studies of other organs and systems: Secondary | ICD-10-CM

## 2018-06-10 DIAGNOSIS — Z171 Estrogen receptor negative status [ER-]: Secondary | ICD-10-CM | POA: Diagnosis not present

## 2018-06-10 DIAGNOSIS — R51 Headache: Secondary | ICD-10-CM

## 2018-06-10 DIAGNOSIS — C773 Secondary and unspecified malignant neoplasm of axilla and upper limb lymph nodes: Secondary | ICD-10-CM

## 2018-06-10 DIAGNOSIS — Z95828 Presence of other vascular implants and grafts: Secondary | ICD-10-CM

## 2018-06-10 DIAGNOSIS — Z5111 Encounter for antineoplastic chemotherapy: Secondary | ICD-10-CM | POA: Diagnosis not present

## 2018-06-10 DIAGNOSIS — C50411 Malignant neoplasm of upper-outer quadrant of right female breast: Secondary | ICD-10-CM

## 2018-06-10 DIAGNOSIS — M79604 Pain in right leg: Secondary | ICD-10-CM | POA: Diagnosis not present

## 2018-06-10 DIAGNOSIS — Z452 Encounter for adjustment and management of vascular access device: Secondary | ICD-10-CM | POA: Diagnosis not present

## 2018-06-10 DIAGNOSIS — Z5189 Encounter for other specified aftercare: Secondary | ICD-10-CM | POA: Diagnosis not present

## 2018-06-10 LAB — CBC WITH DIFFERENTIAL/PLATELET
Abs Immature Granulocytes: 0 10*3/uL (ref 0.00–0.07)
BAND NEUTROPHILS: 4 %
BASOS ABS: 0 10*3/uL (ref 0.0–0.1)
Basophils Relative: 3 %
Eosinophils Absolute: 0.1 10*3/uL (ref 0.0–0.5)
Eosinophils Relative: 5 %
HCT: 32.7 % — ABNORMAL LOW (ref 36.0–46.0)
Hemoglobin: 11.1 g/dL — ABNORMAL LOW (ref 12.0–15.0)
Lymphocytes Relative: 52 %
Lymphs Abs: 0.8 10*3/uL (ref 0.7–4.0)
MCH: 31 pg (ref 26.0–34.0)
MCHC: 33.9 g/dL (ref 30.0–36.0)
MCV: 91.3 fL (ref 80.0–100.0)
Monocytes Absolute: 0.1 10*3/uL (ref 0.1–1.0)
Monocytes Relative: 5 %
Neutro Abs: 0.6 10*3/uL — ABNORMAL LOW (ref 1.7–17.7)
Neutrophils Relative %: 31 %
PLATELETS: 164 10*3/uL (ref 150–400)
RBC: 3.58 MIL/uL — ABNORMAL LOW (ref 3.87–5.11)
RDW: 12 % (ref 11.5–15.5)
WBC: 1.6 10*3/uL — ABNORMAL LOW (ref 4.0–10.5)
nRBC: 0 % (ref 0.0–0.2)

## 2018-06-10 LAB — COMPREHENSIVE METABOLIC PANEL
ALT: 24 U/L (ref 0–44)
AST: 10 U/L — ABNORMAL LOW (ref 15–41)
Albumin: 4 g/dL (ref 3.5–5.0)
Alkaline Phosphatase: 74 U/L (ref 38–126)
Anion gap: 7 (ref 5–15)
BUN: 14 mg/dL (ref 6–20)
CHLORIDE: 103 mmol/L (ref 98–111)
CO2: 28 mmol/L (ref 22–32)
Calcium: 9.3 mg/dL (ref 8.9–10.3)
Creatinine, Ser: 0.66 mg/dL (ref 0.44–1.00)
GFR calc Af Amer: >60 mL/min (ref >60)
GFR calc non Af Amer: >60 mL/min (ref >60)
Glucose, Bld: 98 mg/dL (ref 70–99)
POTASSIUM: 4.3 mmol/L (ref 3.5–5.1)
SODIUM: 138 mmol/L (ref 135–145)
Total Bilirubin: 0.8 mg/dL (ref 0.3–1.2)
Total Protein: 6.6 g/dL (ref 6.5–8.1)

## 2018-06-10 MED ORDER — CIPROFLOXACIN HCL 500 MG PO TABS: ORAL_TABLET | ORAL | 1 refills | Status: DC

## 2018-06-10 MED ORDER — HEPARIN SOD (PORK) LOCK FLUSH 100 UNIT/ML IV SOLN
500.0000 [IU] | Freq: Once | INTRAVENOUS | Status: AC | PRN
  Administered 2018-06-10: 500 [IU]
  Filled 2018-06-10: qty 5

## 2018-06-10 MED ORDER — SODIUM CHLORIDE 0.9% FLUSH
10.0000 mL | INTRAVENOUS | Status: DC | PRN
  Administered 2018-06-10: 10 mL
  Filled 2018-06-10: qty 10

## 2018-06-10 NOTE — Telephone Encounter (Signed)
Per 12/23 no los 

## 2018-06-11 ENCOUNTER — Encounter: Payer: Self-pay | Admitting: Oncology

## 2018-06-17 ENCOUNTER — Encounter: Payer: Self-pay | Admitting: Adult Health

## 2018-06-17 ENCOUNTER — Inpatient Hospital Stay: Payer: BLUE CROSS/BLUE SHIELD

## 2018-06-17 ENCOUNTER — Inpatient Hospital Stay (HOSPITAL_BASED_OUTPATIENT_CLINIC_OR_DEPARTMENT_OTHER): Payer: BLUE CROSS/BLUE SHIELD | Admitting: Adult Health

## 2018-06-17 ENCOUNTER — Telehealth: Payer: Self-pay | Admitting: Adult Health

## 2018-06-17 ENCOUNTER — Telehealth: Payer: Self-pay

## 2018-06-17 VITALS — BP 131/79 | HR 76 | Temp 98.8°F | Resp 18 | Ht 64.25 in | Wt 142.7 lb

## 2018-06-17 DIAGNOSIS — C773 Secondary and unspecified malignant neoplasm of axilla and upper limb lymph nodes: Secondary | ICD-10-CM

## 2018-06-17 DIAGNOSIS — C50411 Malignant neoplasm of upper-outer quadrant of right female breast: Secondary | ICD-10-CM

## 2018-06-17 DIAGNOSIS — Z5189 Encounter for other specified aftercare: Secondary | ICD-10-CM | POA: Diagnosis not present

## 2018-06-17 DIAGNOSIS — R51 Headache: Secondary | ICD-10-CM | POA: Diagnosis not present

## 2018-06-17 DIAGNOSIS — Z5111 Encounter for antineoplastic chemotherapy: Secondary | ICD-10-CM | POA: Diagnosis not present

## 2018-06-17 DIAGNOSIS — Z171 Estrogen receptor negative status [ER-]: Secondary | ICD-10-CM

## 2018-06-17 DIAGNOSIS — Z452 Encounter for adjustment and management of vascular access device: Secondary | ICD-10-CM | POA: Diagnosis not present

## 2018-06-17 DIAGNOSIS — Z95828 Presence of other vascular implants and grafts: Secondary | ICD-10-CM

## 2018-06-17 LAB — COMPREHENSIVE METABOLIC PANEL
ALT: 14 U/L (ref 0–44)
AST: 11 U/L — ABNORMAL LOW (ref 15–41)
Albumin: 4.2 g/dL (ref 3.5–5.0)
Alkaline Phosphatase: 60 U/L (ref 38–126)
Anion gap: 10 (ref 5–15)
BUN: 13 mg/dL (ref 6–20)
CO2: 24 mmol/L (ref 22–32)
Calcium: 9.2 mg/dL (ref 8.9–10.3)
Chloride: 107 mmol/L (ref 98–111)
Creatinine, Ser: 0.74 mg/dL (ref 0.44–1.00)
GFR calc Af Amer: >60 mL/min (ref >60)
GFR calc non Af Amer: >60 mL/min (ref >60)
Glucose, Bld: 99 mg/dL (ref 70–99)
Potassium: 4.3 mmol/L (ref 3.5–5.1)
Sodium: 141 mmol/L (ref 135–145)
Total Bilirubin: 0.4 mg/dL (ref 0.3–1.2)
Total Protein: 6.8 g/dL (ref 6.5–8.1)

## 2018-06-17 LAB — CBC WITH DIFFERENTIAL/PLATELET
Abs Immature Granulocytes: 0.2 10*3/uL — ABNORMAL HIGH (ref 0.00–0.07)
Basophils Absolute: 0 10*3/uL (ref 0.0–0.1)
Basophils Relative: 0 %
Eosinophils Absolute: 0 10*3/uL (ref 0.0–0.5)
Eosinophils Relative: 1 %
HCT: 32.9 % — ABNORMAL LOW (ref 36.0–46.0)
Hemoglobin: 11 g/dL — ABNORMAL LOW (ref 12.0–15.0)
Immature Granulocytes: 4 %
Lymphocytes Relative: 26 %
Lymphs Abs: 1.4 10*3/uL (ref 0.7–4.0)
MCH: 31 pg (ref 26.0–34.0)
MCHC: 33.4 g/dL (ref 30.0–36.0)
MCV: 92.7 fL (ref 80.0–100.0)
MONO ABS: 0.5 10*3/uL (ref 0.1–1.0)
Monocytes Relative: 9 %
NEUTROS ABS: 3.3 10*3/uL (ref 1.7–7.7)
Neutrophils Relative %: 60 %
Platelets: 218 10*3/uL (ref 150–400)
RBC: 3.55 MIL/uL — ABNORMAL LOW (ref 3.87–5.11)
RDW: 13.1 % (ref 11.5–15.5)
WBC: 5.5 10*3/uL (ref 4.0–10.5)
nRBC: 0 % (ref 0.0–0.2)

## 2018-06-17 MED ORDER — SODIUM CHLORIDE 0.9 % IV SOLN
Freq: Once | INTRAVENOUS | Status: AC
  Administered 2018-06-17: 11:00:00 via INTRAVENOUS
  Filled 2018-06-17: qty 250

## 2018-06-17 MED ORDER — SODIUM CHLORIDE 0.9% FLUSH
10.0000 mL | INTRAVENOUS | Status: DC | PRN
  Administered 2018-06-17: 10 mL
  Filled 2018-06-17: qty 10

## 2018-06-17 MED ORDER — PALONOSETRON HCL INJECTION 0.25 MG/5ML
INTRAVENOUS | Status: AC
  Filled 2018-06-17: qty 5

## 2018-06-17 MED ORDER — ACETAMINOPHEN 325 MG PO TABS
ORAL_TABLET | ORAL | Status: AC
  Filled 2018-06-17: qty 2

## 2018-06-17 MED ORDER — ACETAMINOPHEN 325 MG PO TABS
650.0000 mg | ORAL_TABLET | Freq: Once | ORAL | Status: AC | PRN
  Administered 2018-06-17: 650 mg via ORAL

## 2018-06-17 MED ORDER — DOXORUBICIN HCL CHEMO IV INJECTION 2 MG/ML
60.0000 mg/m2 | Freq: Once | INTRAVENOUS | Status: AC
  Administered 2018-06-17: 104 mg via INTRAVENOUS
  Filled 2018-06-17: qty 52

## 2018-06-17 MED ORDER — HEPARIN SOD (PORK) LOCK FLUSH 100 UNIT/ML IV SOLN
500.0000 [IU] | Freq: Once | INTRAVENOUS | Status: AC | PRN
  Administered 2018-06-17: 500 [IU]
  Filled 2018-06-17: qty 5

## 2018-06-17 MED ORDER — SODIUM CHLORIDE 0.9 % IV SOLN
Freq: Once | INTRAVENOUS | Status: AC
  Administered 2018-06-17: 11:00:00 via INTRAVENOUS
  Filled 2018-06-17: qty 5

## 2018-06-17 MED ORDER — PALONOSETRON HCL INJECTION 0.25 MG/5ML
0.2500 mg | Freq: Once | INTRAVENOUS | Status: AC
  Administered 2018-06-17: 0.25 mg via INTRAVENOUS

## 2018-06-17 MED ORDER — SODIUM CHLORIDE 0.9 % IV SOLN
600.0000 mg/m2 | Freq: Once | INTRAVENOUS | Status: AC
  Administered 2018-06-17: 1040 mg via INTRAVENOUS
  Filled 2018-06-17: qty 52

## 2018-06-17 NOTE — Telephone Encounter (Signed)
Magic mouthwash called in to [place] per NP.

## 2018-06-17 NOTE — Progress Notes (Signed)
[place]  [phone] [phone]     ID: Darlene Grant DOB: 1976-03-13  MR#: [id number]  OQH#:[id number]  Patient Care Team: Darlene Grant as PCP - General (Family Medicine) Darlene Bookbinder, MD as Consulting Physician (General Surgery) Magrinat, Virgie Dad, MD as Consulting Physician (Oncology) Darlene Pray, MD as Consulting Physician (Radiation Oncology) Darlene Filbert, MD as Consulting Physician (Obstetrics and Gynecology) Darlene Monarch, MD as Consulting Physician (Dermatology) OTHER MD:   CHIEF COMPLAINT: Triple negative breast cancer  CURRENT TREATMENT: Neoadjuvant chemotherapy   HISTORY OF CURRENT ILLNESS: From the original intake note:  Darlene Grant had routine screening mammography on 04/19/2018 showing a possible abnormality in the right breast. She underwent unilateral right diagnostic mammography with tomography and right breast ultrasonography at [facility] on 05/02/2018 showing: Breast Density Category C: There was an oval circumscribed mass in the posterior aspect of the upper outer right breast.  On exam this measured 3.5 cm, was firm, irregular and centered on 10-11 o'clock 5 cm from the nipple.  By ultrasonography there was a 3.7 x 1.7 x 1.5 cm elongated, irregular, hypoechoic mass in the 10:30 o'clock position of the right breast, 5 cm from the nipple.  Ultrasound of the right axilla showed 7 abnormal right axillary lymph nodes highly suspicious for metastatic nodes.  Accordingly on 05/03/2018 she proceeded to biopsy of the right breast area in question and one suspicious lymph node. The pathology from this procedure showed ([id number]): Right Breast: invasive mammary carcinoma, grade 3, E-cadherin positive (and therefore ductal) lymphovascular invasion present. Lymph node: Invasive mammary carcinoma Prognostic indicators significant for: estrogen receptor, 0% negative and progesterone  receptor, 0% negative. Proliferation marker Ki67 at 75%. HER2 negative (1+).  The patient's subsequent history is as detailed below.  INTERVAL HISTORY: Darlene Grant returns today for follow-up of her triple negative breast cancer.  The patient continues on neoadjuvant chemotherapy, consisting of doxorubicin and cyclophosphamide in dose dense fashion x4. Today is day 1 cycle 2.    REVIEW OF SYSTEMS: Darlene Grant is doing well today.  She says that she had difficulty with reflux related to the Dexamethasone.  She started taking Pepcid and things improved.  Darlene Grant has an exposed nerve root at her gum from a receding gum line.  She is swishing with salt water which is helping, however wants to know if there is anything else that may be helpful.   She says that she is feeling back to "normal" today.  She is doing well otherwise today.  She wants to know what she should do about her son being in preschool in January.    Darlene Grant underwent genetic testing at [facility], where she went for her second opinion.  She says she received a call that the results were negative, however doesn't have any write outs of the detailed genetic testing information.  Darlene Grant denies any headaches or vision changes.  She isn't having any dysphagia, nausea, vomiting, mucositis, bowel/bladder issues.  She is without chest pain, cough, palpitations, or shortness of breath.  A detailed ROS was otherwise non contributory.     PAST MEDICAL HISTORY: Past Medical History:  Diagnosis Date  . Abnormal Pap smear    ASCUS 03/13/11  . Anemia   . Breast pain, left   . Cancer ([place])   . Candida vaginitis   . Complication of anesthesia    pt states that all narcotics make her angry and she would prefer to avoid them.  Marland Kitchen  Rectal itching   . Vaginal Pap smear, abnormal     PAST SURGICAL HISTORY: Past Surgical History:  Procedure Laterality Date  . BREAST BIOPSY Right 8/16  . CESAREAN SECTION N/A 11/21/2013   Procedure: CESAREAN SECTION;   Surgeon: Darlene Pea, MD;  Location: [facility];  Service: Obstetrics;  Laterality: N/A;  . CESAREAN SECTION N/A 06/23/2016   Procedure: CESAREAN SECTION;  Surgeon: Darlene Filbert, MD;  Location: [facility];  Service: Obstetrics;  Laterality: N/A;  . HAND SURGERY  04/2012   ligament repair  . PILONIDAL CYST EXCISION  2000  . PORTACATH PLACEMENT N/A 05/28/2018   Procedure: INSERTION PORT-A-CATH WITH ULTRASOUND;  Surgeon: Darlene Bookbinder, MD;  Location: [facility];  Service: General;  Laterality: N/A;  . WISDOM TOOTH EXTRACTION  AGE 66    FAMILY HISTORY Family History  Problem Relation Age of Onset  . Cancer Mother 25       breast, premenopausal  . Breast cancer Mother   . Cancer Maternal Grandmother        skin, stomach  . Cancer Maternal Grandfather        lung    As of November 2019 her father is 83 years old with a history of Parkinson's. Patients' mother is 76 years old with a history of breast cancer, diagnosed age 47, treated with radical mastectomy. The patient has no brothers and two sisters. Besides the patient's mother, the patient has no other family medical history of breast or ovarian cancer. Her maternal grandmother had stomach cancer and her maternal grandfather had lung cancer, --he was a smoker.    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  Her husband, Darlene Grant, an Chief Financial Officer. She has two children, Darlene Grant, age 63, and Darlene Grant, age 4 months.  The patient is not a smoker. She does drink alcohol and drinks an estimated one to two drinks per week.        ADVANCED DIRECTIVES: Darlene Grant, her husband, is her healthcare department  HEALTH MAINTENANCE: Social History   Tobacco Use  .  Smoking status: Never Smoker  . Smokeless tobacco: Never Used  Substance Use Topics  . Alcohol use: Yes    Alcohol/week: 0.0 standard drinks    Comment: occassionally  . Drug use: No     Colonoscopy: no  PAP: 12/2017  Bone density: no   Allergies  Allergen Reactions  . Augmentin [Amoxicillin-Pot Clavulanate]     diarrhea  . Hydrocodone Itching  . Keflex [Cephalexin] Diarrhea    Current Outpatient Medications  Medication Sig Dispense Refill  . Black Elderberry 50 MG/5ML SYRP Take by mouth.    . ciprofloxacin (CIPRO) 500 MG tablet Take one tablet by mouth 2 times daily for 5 days after each chemo cycle, starting on day 8 of chemo 40 tablet 1  . cyclobenzaprine (FLEXERIL) 10 MG tablet Take 0.5-1 tablets (5-10 mg total) by mouth 3 (three) times daily as needed for muscle spasms. 30 tablet 1  . dexamethasone (DECADRON) 4 MG tablet Take 2 tablets by mouth once a day on the day after chemotherapy and then take  2 tablets two times a day for 2 days. Take with food. 30 tablet 1  . lidocaine-prilocaine (EMLA) cream Apply to affected area once 30 g 3  . loratadine (CLARITIN) 10 MG tablet Take 1 tablet (10 mg total) by mouth daily. 90 tablet 0  . LORazepam (ATIVAN) 0.5 MG tablet Take 1 tablet (0.5 mg total) by mouth at bedtime as needed (Nausea or vomiting). 30 tablet 0  . Probiotic Product (PROBIOTIC DAILY PO) Take 1 tablet by mouth.    . prochlorperazine (COMPAZINE) 10 MG tablet Take 1 tablet (10 mg total) by mouth every 6 (six) hours as needed (Nausea or vomiting). 30 tablet 1   No current facility-administered medications for this visit.    Facility-Administered Medications Ordered in Other Visits  Medication Dose Route Frequency Provider Last Rate Last Dose  . 0.9 %  sodium chloride infusion   Intravenous Once Magrinat, Virgie Dad, MD      . acetaminophen (TYLENOL) tablet 650 mg  650 mg Oral Once PRN Magrinat, Virgie Dad, MD      . cyclophosphamide (CYTOXAN) 1,040 mg in sodium chloride 0.9  % 250 mL chemo infusion  600 mg/m2 (Treatment Plan Recorded) Intravenous Once Magrinat, Virgie Dad, MD      . DOXOrubicin (ADRIAMYCIN) chemo injection 104 mg  60 mg/m2 (Treatment Plan Recorded) Intravenous Once Magrinat, Virgie Dad, MD      . fosaprepitant (EMEND) 150 mg, dexamethasone (DECADRON) 12 mg in sodium chloride 0.9 % 145 mL IVPB   Intravenous Once Magrinat, Virgie Dad, MD      . heparin lock flush 100 unit/mL  500 Units Intracatheter Once PRN Magrinat, Virgie Dad, MD      . palonosetron (ALOXI) injection 0.25 mg  0.25 mg Intravenous Once Magrinat, Virgie Dad, MD      . sodium chloride flush (NS) 0.9 % injection 10 mL  10 mL Intracatheter PRN Magrinat, Virgie Dad, MD        OBJECTIVE:  Vitals:   06/17/18 0952  BP: 131/79  Pulse: 76  Resp: 18  Temp: 98.8 F (37.1 C)  SpO2: 100%     Body mass index is 24.3 kg/m.   Wt Readings from Last 3 Encounters:  06/17/18 142 lb 11.2 oz (64.7 kg)  06/10/18 143 lb 9.6 oz (65.1 kg)  06/03/18 143 lb 1.6 oz (64.9 kg)  ECOG FS:1 GENERAL: Patient is a well appearing female in no acute distress HEENT:  Sclerae anicteric.  Oropharynx clear and moist. No ulcerations or evidence of oropharyngeal candidiasis. Neck is supple.  NODES:  No cervical, supraclavicular, or axillary lymphadenopathy palpated.  BREAST EXAM:  Deferred. LUNGS:  Clear to auscultation bilaterally.  No wheezes or rhonchi. HEART:  Regular rate and rhythm. No murmur appreciated. ABDOMEN:  Soft, nontender.  Positive, normoactive bowel sounds. No organomegaly palpated. MSK:  No focal spinal tenderness to palpation. Full range of motion bilaterally in the upper extremities. EXTREMITIES:  No peripheral edema.   SKIN:  Clear with no obvious rashes or skin changes. No nail dyscrasia. NEURO:  Nonfocal. Well oriented.  Appropriate affect.       LAB RESULTS:  CMP     Component Value Date/Time   NA 141 06/17/2018 0926   K 4.3 06/17/2018 0926   CL 107 06/17/2018 0926   CO2 24 06/17/2018  0926   GLUCOSE 99 06/17/2018 0926   BUN 13 06/17/2018 0926   CREATININE 0.74 06/17/2018 0926   CREATININE 0.76 05/15/2018 0827   CREATININE 0.78 01/15/2018 1033  CALCIUM 9.2 06/17/2018 0926   PROT 6.8 06/17/2018 0926   ALBUMIN 4.2 06/17/2018 0926   AST 11 (L) 06/17/2018 0926   AST 10 (L) 05/15/2018 0827   ALT 14 06/17/2018 0926   ALT 9 05/15/2018 0827   ALKPHOS 60 06/17/2018 0926   BILITOT 0.4 06/17/2018 0926   BILITOT 1.1 05/15/2018 0827   GFRNONAA >60 06/17/2018 0926   GFRNONAA >60 05/15/2018 0827   GFRNONAA 95 01/15/2018 1033   GFRAA >60 06/17/2018 0926   GFRAA >60 05/15/2018 0827   GFRAA 110 01/15/2018 1033    No results found for: TOTALPROTELP, ALBUMINELP, A1GS, A2GS, BETS, BETA2SER, GAMS, MSPIKE, SPEI  No results found for: KPAFRELGTCHN, LAMBDASER, KAPLAMBRATIO  Lab Results  Component Value Date   WBC 5.5 06/17/2018   NEUTROABS 3.3 06/17/2018   HGB 11.0 (L) 06/17/2018   HCT 32.9 (L) 06/17/2018   MCV 92.7 06/17/2018   PLT 218 06/17/2018    '@LASTCHEMISTRY'$ @  No results found for: LABCA2  No components found for: DVVOHY073  No results for input(s): INR in the last 168 hours.  No results found for: LABCA2  No results found for: XTG626  No results found for: RSW546  No results found for: EVO350  No results found for: CA2729  No components found for: HGQUANT  No results found for: CEA1 / No results found for: CEA1   No results found for: AFPTUMOR  No results found for: CHROMOGRNA  No results found for: PSA1  Appointment on 06/17/2018  Component Date Value Ref Range Status  . Sodium 06/17/2018 141  135 - 145 mmol/L Final  . Potassium 06/17/2018 4.3  3.5 - 5.1 mmol/L Final  . Chloride 06/17/2018 107  98 - 111 mmol/L Final  . CO2 06/17/2018 24  22 - 32 mmol/L Final  . Glucose, Bld 06/17/2018 99  70 - 99 mg/dL Final  . BUN 06/17/2018 13  6 - 20 mg/dL Final  . Creatinine, Ser 06/17/2018 0.74  0.44 - 1.00 mg/dL Final  . Calcium 06/17/2018 9.2  8.9  - 10.3 mg/dL Final  . Total Protein 06/17/2018 6.8  6.5 - 8.1 g/dL Final  . Albumin 06/17/2018 4.2  3.5 - 5.0 g/dL Final  . AST 06/17/2018 11* 15 - 41 U/L Final  . ALT 06/17/2018 14  0 - 44 U/L Final  . Alkaline Phosphatase 06/17/2018 60  38 - 126 U/L Final  . Total Bilirubin 06/17/2018 0.4  0.3 - 1.2 mg/dL Final  . GFR calc non Af Amer 06/17/2018 >60  >60 mL/min Final  . GFR calc Af Amer 06/17/2018 >60  >60 mL/min Final  . Anion gap 06/17/2018 10  5 - 15 Final   Performed at [facility], [place] [address] [place], [place] [address]  . WBC 06/17/2018 5.5  4.0 - 10.5 K/uL Final  . RBC 06/17/2018 3.55* 3.87 - 5.11 MIL/uL Final  . Hemoglobin 06/17/2018 11.0* 12.0 - 15.0 g/dL Final  . HCT 06/17/2018 32.9* 36.0 - 46.0 % Final  . MCV 06/17/2018 92.7  80.0 - 100.0 fL Final  . MCH 06/17/2018 31.0  26.0 - 34.0 pg Final  . MCHC 06/17/2018 33.4  30.0 - 36.0 g/dL Final  . RDW 06/17/2018 13.1  11.5 - 15.5 % Final  . Platelets 06/17/2018 218  150 - 400 K/uL Final  . nRBC 06/17/2018 0.0  0.0 - 0.2 % Final  . Neutrophils Relative % 06/17/2018 60  % Final  . Neutro Abs 06/17/2018 3.3  1.7 -  7.7 K/uL Final  . Lymphocytes Relative 06/17/2018 26  % Final  . Lymphs Abs 06/17/2018 1.4  0.7 - 4.0 K/uL Final  . Monocytes Relative 06/17/2018 9  % Final  . Monocytes Absolute 06/17/2018 0.5  0.1 - 1.0 K/uL Final  . Eosinophils Relative 06/17/2018 1  % Final  . Eosinophils Absolute 06/17/2018 0.0  0.0 - 0.5 K/uL Final  . Basophils Relative 06/17/2018 0  % Final  . Basophils Absolute 06/17/2018 0.0  0.0 - 0.1 K/uL Final  . Immature Granulocytes 06/17/2018 4  % Final  . Abs Immature Granulocytes 06/17/2018 0.20* 0.00 - 0.07 K/uL Final   Performed at [facility], [place] [address] [place], [place] [address]    (this displays the last labs from the last 3 days)  No results found for: TOTALPROTELP, ALBUMINELP, A1GS, A2GS, BETS, BETA2SER, GAMS, MSPIKE, SPEI (this  displays SPEP labs)  No results found for: KPAFRELGTCHN, LAMBDASER, KAPLAMBRATIO (kappa/lambda light chains)  No results found for: HGBA, HGBA2QUANT, HGBFQUANT, HGBSQUAN (Hemoglobinopathy evaluation)   No results found for: LDH  Lab Results  Component Value Date   IRON 59 12/27/2016   TIBC 329 12/27/2016   IRONPCTSAT 18 12/27/2016   (Iron and TIBC)  Lab Results  Component Value Date   FERRITIN 24 01/15/2018    Urinalysis    Component Value Date/Time   COLORURINE YELLOW 12/18/2016 [place] 12/18/2016 1607   LABSPEC 1.010 12/18/2016 1607   PHURINE 7.0 12/18/2016 1607   GLUCOSEU NEGATIVE 12/18/2016 1607   HGBUR NEGATIVE 12/18/2016 1607   HGBUR negative 01/13/2010 0940   BILIRUBINUR NEGATIVE 12/18/2016 1607   BILIRUBINUR neg 05/17/2015 [place] 12/18/2016 1607   PROTEINUR NEGATIVE 12/18/2016 1607   UROBILINOGEN 0.2 05/17/2015 1034   UROBILINOGEN 0.2 01/13/2010 0940   NITRITE NEGATIVE 12/18/2016 1607   LEUKOCYTESUR NEGATIVE 12/18/2016 1607     STUDIES:  Dg Chest 1 View  Result Date: 05/28/2018 CLINICAL DATA:  Right-sided Port-A-Cath insertion. EXAM: CHEST  1 VIEW COMPARISON:  No recent prior. FINDINGS: Port-A-Cath noted with tip over superior vena cava. No pneumothorax noted. Surgical sponge and clips noted over the right lower chest. IMPRESSION: Port-A-Cath noted with tip over superior vena cava. Electronically Signed   By: Marcello Moores  Register   On: 05/28/2018 13:00   Ct Chest W Contrast  Result Date: 05/29/2018 CLINICAL DATA:  Newly diagnosed right breast cancer, for staging. Chemotherapy starting next week. EXAM: CT CHEST, ABDOMEN, AND PELVIS WITH CONTRAST TECHNIQUE: Multidetector CT imaging of the chest, abdomen and pelvis was performed following the standard protocol during bolus administration of intravenous contrast. CONTRAST:  166m OMNIPAQUE IOHEXOL 300 MG/ML  SOLN COMPARISON:  None. FINDINGS: CT CHEST FINDINGS Cardiovascular:  The heart is normal in size. No pericardial effusion. No evidence of thoracic aortic aneurysm. Right chest port terminates in the lower SVC. Mediastinum/Nodes: Right axillary nodes measuring up to 10 mm short axis (series 2/image 18), suspicious. Additional right subpectoral nodes measuring up to 7 mm short axis (series 2/image 12), suspicious. No suspicious mediastinal or hilar lymphadenopathy. Visualized thyroid is unremarkable. Lungs/Pleura: Lungs are clear. No suspicious pulmonary nodules. No focal consolidation. No pleural effusion or pneumothorax. Musculoskeletal: 2.1 cm lateral right breast mass (series 2/image 22), likely corresponding to the patient's known newly diagnosed breast cancer. Visualized osseous structures are within normal limits. CT ABDOMEN PELVIS FINDINGS Hepatobiliary: Liver is within normal limits. Gallbladder is unremarkable. No intrahepatic or extrahepatic ductal dilatation. Pancreas: Within normal limits. Spleen:  Within normal limits. Adrenals/Urinary Tract: Adrenal glands are within normal limits. Kidneys are within normal limits.  No hydronephrosis. Bladder is underdistended but unremarkable. Stomach/Bowel: Stomach is within normal limits. No evidence of bowel obstruction. Normal appendix (series 2/image 36). Vascular/Lymphatic: No evidence of abdominal aortic aneurysm. No suspicious abdominopelvic lymphadenopathy. Reproductive: Uterus is within normal limits. Bilateral ovaries are unremarkable, noting a right corpus luteum. Other: No abdominopelvic ascites. Musculoskeletal: Visualized osseous structures are within normal limits. IMPRESSION: 2.1 cm lateral right breast mass, likely corresponding to the patient's known newly diagnosed breast cancer. Associated right axillary and subpectoral nodal metastases. No evidence of distant metastases. Electronically Signed   By: Julian Hy M.D.   On: 05/29/2018 11:12   Nm Bone Scan Whole Body  Result Date: 05/29/2018 CLINICAL DATA:   Invasive RIGHT breast cancer, new diagnosis EXAM: NUCLEAR MEDICINE WHOLE BODY BONE SCAN TECHNIQUE: Whole body anterior and posterior images were obtained approximately 3 hours after intravenous injection of radiopharmaceutical. RADIOPHARMACEUTICALS:  21.8 mCi Technetium-55mMDP IV COMPARISON:  None Radiographic correlation: CT chest abdomen pelvis 05/29/2018 FINDINGS: Normal tracer localization throughout the axial and appendicular skeleton. Questionable subtle focus of increased tracer localization at the proximal to mid RIGHT femoral diaphysis. No additional sites of abnormal osseous tracer accumulation are identified. Expected urinary tract and soft tissue distribution of tracer. IMPRESSION: Single questionable focus of abnormal osseous tracer accumulation at the proximal to mid RIGHT femoral diaphysis, metastatic focus not excluded.; Recommend radiographic correlation. Remainder of exam normal. Electronically Signed   By: MLavonia DanaM.D.   On: 05/29/2018 16:14   Ct Abdomen Pelvis W Contrast  Result Date: 05/29/2018 CLINICAL DATA:  Newly diagnosed right breast cancer, for staging. Chemotherapy starting next week. EXAM: CT CHEST, ABDOMEN, AND PELVIS WITH CONTRAST TECHNIQUE: Multidetector CT imaging of the chest, abdomen and pelvis was performed following the standard protocol during bolus administration of intravenous contrast. CONTRAST:  1044mOMNIPAQUE IOHEXOL 300 MG/ML  SOLN COMPARISON:  None. FINDINGS: CT CHEST FINDINGS Cardiovascular: The heart is normal in size. No pericardial effusion. No evidence of thoracic aortic aneurysm. Right chest port terminates in the lower SVC. Mediastinum/Nodes: Right axillary nodes measuring up to 10 mm short axis (series 2/image 18), suspicious. Additional right subpectoral nodes measuring up to 7 mm short axis (series 2/image 12), suspicious. No suspicious mediastinal or hilar lymphadenopathy. Visualized thyroid is unremarkable. Lungs/Pleura: Lungs are clear. No  suspicious pulmonary nodules. No focal consolidation. No pleural effusion or pneumothorax. Musculoskeletal: 2.1 cm lateral right breast mass (series 2/image 22), likely corresponding to the patient's known newly diagnosed breast cancer. Visualized osseous structures are within normal limits. CT ABDOMEN PELVIS FINDINGS Hepatobiliary: Liver is within normal limits. Gallbladder is unremarkable. No intrahepatic or extrahepatic ductal dilatation. Pancreas: Within normal limits. Spleen: Within normal limits. Adrenals/Urinary Tract: Adrenal glands are within normal limits. Kidneys are within normal limits.  No hydronephrosis. Bladder is underdistended but unremarkable. Stomach/Bowel: Stomach is within normal limits. No evidence of bowel obstruction. Normal appendix (series 2/image 36). Vascular/Lymphatic: No evidence of abdominal aortic aneurysm. No suspicious abdominopelvic lymphadenopathy. Reproductive: Uterus is within normal limits. Bilateral ovaries are unremarkable, noting a right corpus luteum. Other: No abdominopelvic ascites. Musculoskeletal: Visualized osseous structures are within normal limits. IMPRESSION: 2.1 cm lateral right breast mass, likely corresponding to the patient's known newly diagnosed breast cancer. Associated right axillary and subpectoral nodal metastases. No evidence of distant metastases. Electronically Signed   By: SrJulian Hy.D.   On: 05/29/2018 11:12   Mr Breast Bilateral W Wo  Contrast Inc Cad  Addendum Date: 05/31/2018   ADDENDUM REPORT: 05/31/2018 09:37 ADDENDUM: Within the FINDINGS section of the RIGHT BREAST, the non masslike enhancement is located throughout the UPPER-OUTER RIGHT BREAST, instead of the LEFT breast as on the original report. Electronically Signed   By: Margarette Canada M.D.   On: 05/31/2018 09:37   Result Date: 05/31/2018 CLINICAL DATA:  42 year old female with newly diagnosed invasive RIGHT breast cancer with metastasis to RIGHT axillary lymph node. LABS:   None performed today EXAM: BILATERAL BREAST MRI WITH AND WITHOUT CONTRAST TECHNIQUE: Multiplanar, multisequence MR images of both breasts were obtained prior to and following the intravenous administration of 7 ml of Gadavist Three-dimensional MR images were rendered by post-processing of the original MR data on an independent workstation. The three-dimensional MR images were interpreted, and findings are reported in the following complete MRI report for this study. Three dimensional images were evaluated at the independent DynaCad workstation COMPARISON:  Previous exam(s). FINDINGS: Breast composition: c. Heterogeneous fibroglandular tissue. Background parenchymal enhancement: Mild Right breast: A 1.7 x 3.7 cm enhancing mass within the UPPER-OUTER RIGHT breast contains biopsy clip artifact and compatible with biopsy-proven malignancy. Biopsy clip artifact within a 1.3 cm mass/lymph node in the axillary tail is compatible with biopsy-proven malignancy. Non masslike enhancement throughout the UPPER OUTER LEFT breast has similar enhancement characteristics as the biopsy-proven breast malignancy, extending at least 5 cm inferomedially from the biopsy-proven malignancy. The entire area of non masslike enhancement and biopsy-proven malignancy measures 3.5 x 7 x 6.5 cm (transverse x AP x CC). No other areas of abnormal enhancement are noted. Left breast: No mass or abnormal enhancement. Lymph nodes: 7 abnormal appearing RIGHT axillary lymph nodes are identified in include level 1, level 2 and level 3 nodes. No other abnormal lymph nodes are identified. Ancillary findings:  None. IMPRESSION: 1. 3.7 cm biopsy-proven UPPER-OUTER RIGHT breast malignancy and 1.3 cm biopsy-proven malignancy/metastatic lymph node in the RIGHT axillary tail. 2. Abnormal non masslike enhancement throughout the UPPER-OUTER RIGHT breast with similar enhancement characteristics to the biopsy-proven malignancy. This non masslike enhancement extends 5  cm inferomedially from the biopsy-proven RIGHT breast malignancy, with the entire area including the biopsy-proven malignancy measuring 3.5 x 7 x 6.5 cm. Recommend tissue sampling of the anterior aspect of the suspicious non masslike enhancement if breast conservation is desired. 3. Seven abnormal appearing RIGHT axillary lymph nodes involving level 1, level 2 and level 3. 4. No MR evidence of LEFT breast malignancy. RECOMMENDATION: 1. If breast conservation is desired, recommend MR guided biopsy of the anterior aspect of UPPER-OUTER RIGHT breast non masslike enhancement. 2. Treatment plan otherwise. BI-RADS CATEGORY  4: Suspicious. Electronically Signed: By: Margarette Canada M.D. On: 05/28/2018 14:42   Dg Fluoro Guide Cv Line Right  Result Date: 05/28/2018 CLINICAL DATA:  Port catheter placement EXAM: CENTRAL VENOUS CATHETER WITH FLUOROSCOPY FINDINGS: Right IJ power injectable Port-A-Cath noted with tip over superior vena cava. No pneumothorax noted. Surgical sponge and clips noted over the right lower chest. IMPRESSION: Port-A-Cath noted with tip over superior vena cava. Electronically Signed   By: Lucrezia Europe M.D.   On: 05/28/2018 13:51   Dg Femur, Min 2 Views Right  Result Date: 06/10/2018 CLINICAL DATA:  Right leg pain and abnormal bone scan EXAM: RIGHT FEMUR 2 VIEWS COMPARISON:  None. FINDINGS: No acute fracture or dislocation is noted. No gross soft tissue abnormality is noted. No definitive lytic or sclerotic lesion is identified to correspond with that seen on recent  bone scan. IMPRESSION: No acute abnormality noted. No focal abnormality to correspond with findings of recent bone scan. Electronically Signed   By: Inez Catalina M.D.   On: 06/10/2018 21:40    ELIGIBLE FOR AVAILABLE RESEARCH PROTOCOL: Upbeat  ASSESSMENT: 42 y.o. [facility] woman, status post right breast upper outer quadrant and right axillary lymph node biopsy 05/03/2018 for a clinical T2 N2, stage IIIC invasive  ductal carcinoma, triple negative, with an MIB-1 of 75%  (a) staging studies with CT scans of the chest abdomen and pelvis on 05/29/18 show no distant metastases; bone scan shows questionable focus of tracer accumulation on the proximal to mid right femoral diaphysis  (b) MRI breast on 05/31/18 shows additional non mass like enhancement throughout upper outer right breast that extends 5cm inferomedially from biopsy proven malignancy.    (1) genetics testing 06/06/2018--at [facility], negative per patient (awaiting official note in care everywhere)  (2) neoadjuvant chemotherapy consisting of doxorubicin and cyclophosphamide in dose dense fashion x4 started 06/03/2018, to be  followed by paclitaxel and carboplatin weekly x12 (a) Echo on 05/23/2018 shows an EF of 60-65%.  (3) definitive surgery to follow (patient is planning on bilateral mastectomies)  (4) adjuvant radiation to follow surgery  PLAN:  Darlene Grant is doing well today. Her CBC is improved from last week and she will proceed with cycle 2 of Doxorubicin and Cyclophosphamide (so long as CMET is within parameters).  We reviewed her labs and the Udenyca in detail.  We reviewed her nadir periods and what to do about her son being in day care during chemotherapy.  I let her know to use her best judgment and perhaps ask the school to notify her if any "bugs" such as flu, strep, or hand foot mouth start to go around at the day care.  She verbalized understanding about this.  In regards to the pain at the gum line, we will send in magic mouthwash for her to use if needed.  I recommended that she start Miralax today.  She wanted to know when to take it, so we decided on her taking it towards the end of her treatment today mixed in 4 ounces of fluid of her choice.  She understands this.    Otherwise she will return for Udenyca in 1-2 days and then to see [place] in a week for labs and f/u.  She knows to call for any other problems that may develop before that  visit.  A total of (30) minutes of face-to-face time was spent with this patient with greater than 50% of that time in counseling and care-coordination.   Wilber Bihari, NP  06/17/18 10:28 AM Medical Oncology and Hematology [facility] [address] [place], [place] [address] Tel. [phone]    Fax. [phone]

## 2018-06-17 NOTE — Telephone Encounter (Signed)
Per 12/30 no los °

## 2018-06-17 NOTE — Patient Instructions (Signed)
[facility] Discharge Instructions for Patients Receiving Chemotherapy  Today you received the following chemotherapy agents Adriamycin and Cytoxan  To help prevent nausea and vomiting after your treatment, we encourage you to take your nausea medication as directed.  If you develop nausea and vomiting that is not controlled by your nausea medication, call the clinic.   BELOW ARE SYMPTOMS THAT SHOULD BE REPORTED IMMEDIATELY:  *FEVER GREATER THAN 100.5 F  *CHILLS WITH OR WITHOUT FEVER  NAUSEA AND VOMITING THAT IS NOT CONTROLLED WITH YOUR NAUSEA MEDICATION  *UNUSUAL SHORTNESS OF BREATH  *UNUSUAL BRUISING OR BLEEDING  TENDERNESS IN MOUTH AND THROAT WITH OR WITHOUT PRESENCE OF ULCERS  *URINARY PROBLEMS  *BOWEL PROBLEMS  UNUSUAL RASH Items with * indicate a potential emergency and should be followed up as soon as possible.  Feel free to call the clinic should you have any questions or concerns. The clinic phone number is [phone].  Please show the CHEMO ALERT CARD at check-in to the Emergency Department and triage nurse.   

## 2018-06-18 ENCOUNTER — Inpatient Hospital Stay: Payer: BLUE CROSS/BLUE SHIELD

## 2018-06-18 VITALS — BP 119/69 | HR 75 | Temp 99.4°F | Resp 18

## 2018-06-18 DIAGNOSIS — R51 Headache: Secondary | ICD-10-CM | POA: Diagnosis not present

## 2018-06-18 DIAGNOSIS — Z5111 Encounter for antineoplastic chemotherapy: Secondary | ICD-10-CM | POA: Diagnosis not present

## 2018-06-18 DIAGNOSIS — C50411 Malignant neoplasm of upper-outer quadrant of right female breast: Secondary | ICD-10-CM

## 2018-06-18 DIAGNOSIS — Z171 Estrogen receptor negative status [ER-]: Secondary | ICD-10-CM | POA: Diagnosis not present

## 2018-06-18 DIAGNOSIS — Z452 Encounter for adjustment and management of vascular access device: Secondary | ICD-10-CM | POA: Diagnosis not present

## 2018-06-18 DIAGNOSIS — C773 Secondary and unspecified malignant neoplasm of axilla and upper limb lymph nodes: Secondary | ICD-10-CM | POA: Diagnosis not present

## 2018-06-18 DIAGNOSIS — Z5189 Encounter for other specified aftercare: Secondary | ICD-10-CM | POA: Diagnosis not present

## 2018-06-18 MED ORDER — PEGFILGRASTIM-CBQV 6 MG/0.6ML ~~LOC~~ SOSY
6.0000 mg | PREFILLED_SYRINGE | Freq: Once | SUBCUTANEOUS | Status: AC
  Administered 2018-06-18: 6 mg via SUBCUTANEOUS

## 2018-06-21 ENCOUNTER — Telehealth: Payer: Self-pay

## 2018-06-21 NOTE — Telephone Encounter (Signed)
Nurse called patient to follow up with on call message about diarrhea.   Patient's diarrhea has subsided today.  Patient reports soft formed stool X 2 today.  Patient was advised by on call to take imodium and fluids.  Patient reported that after second round of chemo (Monday), Wednesday diarrhea occurred consistently.  Nurse encouraged plenty of fluids over the weekend.  Patient scheduled to see NP on 06/24/2018, patient would like to discuss with provider at upcoming appointment.  Will forward to provider.

## 2018-06-24 ENCOUNTER — Encounter: Payer: Self-pay | Admitting: Adult Health

## 2018-06-24 ENCOUNTER — Telehealth: Payer: Self-pay

## 2018-06-24 ENCOUNTER — Inpatient Hospital Stay: Payer: BLUE CROSS/BLUE SHIELD

## 2018-06-24 ENCOUNTER — Inpatient Hospital Stay: Payer: BLUE CROSS/BLUE SHIELD | Attending: Oncology | Admitting: Adult Health

## 2018-06-24 VITALS — BP 125/77 | HR 76 | Temp 98.4°F | Resp 18 | Ht 64.25 in | Wt 142.4 lb

## 2018-06-24 DIAGNOSIS — C50411 Malignant neoplasm of upper-outer quadrant of right female breast: Secondary | ICD-10-CM | POA: Diagnosis not present

## 2018-06-24 DIAGNOSIS — R110 Nausea: Secondary | ICD-10-CM | POA: Insufficient documentation

## 2018-06-24 DIAGNOSIS — Z452 Encounter for adjustment and management of vascular access device: Secondary | ICD-10-CM | POA: Insufficient documentation

## 2018-06-24 DIAGNOSIS — Z171 Estrogen receptor negative status [ER-]: Secondary | ICD-10-CM | POA: Insufficient documentation

## 2018-06-24 DIAGNOSIS — Z5189 Encounter for other specified aftercare: Secondary | ICD-10-CM | POA: Diagnosis not present

## 2018-06-24 DIAGNOSIS — E860 Dehydration: Secondary | ICD-10-CM | POA: Insufficient documentation

## 2018-06-24 DIAGNOSIS — Z95828 Presence of other vascular implants and grafts: Secondary | ICD-10-CM

## 2018-06-24 DIAGNOSIS — Z5111 Encounter for antineoplastic chemotherapy: Secondary | ICD-10-CM | POA: Diagnosis not present

## 2018-06-24 LAB — COMPREHENSIVE METABOLIC PANEL
ALT: 9 U/L (ref 0–44)
ANION GAP: 8 (ref 5–15)
AST: 10 U/L — ABNORMAL LOW (ref 15–41)
Albumin: 4.1 g/dL (ref 3.5–5.0)
Alkaline Phosphatase: 95 U/L (ref 38–126)
BUN: 12 mg/dL (ref 6–20)
CO2: 27 mmol/L (ref 22–32)
Calcium: 9.6 mg/dL (ref 8.9–10.3)
Chloride: 104 mmol/L (ref 98–111)
Creatinine, Ser: 0.69 mg/dL (ref 0.44–1.00)
Glucose, Bld: 101 mg/dL — ABNORMAL HIGH (ref 70–99)
Potassium: 4.4 mmol/L (ref 3.5–5.1)
Sodium: 139 mmol/L (ref 135–145)
Total Bilirubin: 0.9 mg/dL (ref 0.3–1.2)
Total Protein: 6.7 g/dL (ref 6.5–8.1)

## 2018-06-24 LAB — CBC WITH DIFFERENTIAL/PLATELET
Abs Immature Granulocytes: 0.15 10*3/uL — ABNORMAL HIGH (ref 0.00–0.07)
Basophils Absolute: 0 10*3/uL (ref 0.0–0.1)
Basophils Relative: 1 %
Eosinophils Absolute: 0 10*3/uL (ref 0.0–0.5)
Eosinophils Relative: 0 %
HCT: 30.2 % — ABNORMAL LOW (ref 36.0–46.0)
Hemoglobin: 10.2 g/dL — ABNORMAL LOW (ref 12.0–15.0)
IMMATURE GRANULOCYTES: 7 %
Lymphocytes Relative: 23 %
Lymphs Abs: 0.5 10*3/uL — ABNORMAL LOW (ref 0.7–4.0)
MCH: 31.2 pg (ref 26.0–34.0)
MCHC: 33.8 g/dL (ref 30.0–36.0)
MCV: 92.4 fL (ref 80.0–100.0)
Monocytes Absolute: 0.1 10*3/uL (ref 0.1–1.0)
Monocytes Relative: 6 %
NEUTROS PCT: 63 %
Neutro Abs: 1.5 10*3/uL — ABNORMAL LOW (ref 1.7–7.7)
Platelets: 198 10*3/uL (ref 150–400)
RBC: 3.27 MIL/uL — ABNORMAL LOW (ref 3.87–5.11)
RDW: 12.9 % (ref 11.5–15.5)
WBC: 2.3 10*3/uL — ABNORMAL LOW (ref 4.0–10.5)
nRBC: 0 % (ref 0.0–0.2)

## 2018-06-24 MED ORDER — HEPARIN SOD (PORK) LOCK FLUSH 100 UNIT/ML IV SOLN
500.0000 [IU] | Freq: Once | INTRAVENOUS | Status: AC
  Administered 2018-06-24: 500 [IU] via INTRAVENOUS
  Filled 2018-06-24: qty 5

## 2018-06-24 MED ORDER — SODIUM CHLORIDE 0.9% FLUSH
10.0000 mL | INTRAVENOUS | Status: DC | PRN
  Administered 2018-06-24: 10 mL via INTRAVENOUS
  Filled 2018-06-24: qty 10

## 2018-06-24 NOTE — Telephone Encounter (Signed)
Called patient to inform of neutrophil count of 1.5.  Per NP does not have to take Cipro at this time.  Patient voiced understanding and knows to call with any issues/concerns.

## 2018-06-24 NOTE — Progress Notes (Signed)
[place]  [phone] [phone]     ID: Darlene Grant DOB: 11-Dec-1975  MR#: [id number]  IOX#:[id number]  Patient Care Team: Darlene Grant as PCP - General (Family Medicine) Darlene Bookbinder, MD as Consulting Physician (General Surgery) Darlene Grant, Darlene Dad, MD as Consulting Physician (Oncology) Darlene Pray, MD as Consulting Physician (Radiation Oncology) Darlene Filbert, MD as Consulting Physician (Obstetrics and Gynecology) Darlene Monarch, MD as Consulting Physician (Dermatology) OTHER MD:   CHIEF COMPLAINT: Triple negative breast cancer  CURRENT TREATMENT: Neoadjuvant chemotherapy   HISTORY OF CURRENT ILLNESS: From Darlene original intake note:  Darlene Grant had routine screening mammography on 04/19/2018 showing a possible abnormality in Darlene right breast. She underwent unilateral right diagnostic mammography with tomography and right breast ultrasonography at [facility] on 05/02/2018 showing: Breast Density Category C: There was an oval circumscribed mass in Darlene posterior aspect of Darlene upper outer right breast.  On exam this measured 3.5 cm, was firm, irregular and centered on 10-11 o'clock 5 cm from Darlene nipple.  By ultrasonography there was a 3.7 x 1.7 x 1.5 cm elongated, irregular, hypoechoic mass in Darlene 10:30 o'clock position of Darlene right breast, 5 cm from Darlene nipple.  Ultrasound of Darlene right axilla showed 7 abnormal right axillary lymph nodes highly suspicious for metastatic nodes.  Accordingly on 05/03/2018 she proceeded to biopsy of Darlene right breast area in question and one suspicious lymph node. Darlene pathology from this procedure showed ([id number]): Right Breast: invasive mammary carcinoma, grade 3, E-cadherin positive (and therefore ductal) lymphovascular invasion present. Lymph node: Invasive mammary carcinoma Prognostic indicators significant for: estrogen receptor, 0% negative and progesterone  receptor, 0% negative. Proliferation marker Ki67 at 75%. HER2 negative (1+).  Darlene patient's subsequent history is as detailed below.  INTERVAL HISTORY: Darlene Grant returns today for follow-up of her triple negative breast cancer.  Darlene patient continues on neoadjuvant chemotherapy, consisting of doxorubicin and cyclophosphamide in dose dense fashion x4. Today is day 8 cycle 2.    REVIEW OF SYSTEMS: Darlene Grant has had an eventful week.  She had constipation with cycle 1.  She took Miralax on Darlene night of chemo.  She had a bm that night that was small, then next day she had two solid bowel movements and increased abdominal gas.  She did not take any more miralax.  On 1/1 she woke up at 6 am with watery diarrhea.  This happened all day x 10-12 episodes and she called in to see if she could take imodium.  She took two imodium that afternoon which helped, and then at 2am Darlene following day she took another imodium.  Darlene diarrhea didn't completely stop, but became more manageable.  On 1/3 she still had diarrhea 2-3 times per day and she didn't take any further imodium.  On 1/4 she had 5 solid bowel movements and she took '4mg'$  imodium and then 1 imodium afterward.    Darlene Grant has had some nausea with this cycle.  She didn't take Dexamethasone and took a couple of days of compazine and lorazepam if needed at night, and her nausea has remained stable.  She notes a slight tightness in her chest Darlene night after chemotherapy.  She doesn't note any shortness of breath, or palpitations accompanied with Darlene tightness.  This is brief, occurs for a couple of hours while she is lying down, and it resolves after going to bed.    Darlene Grant started her cycle today and wants to let me  know this. A couple of weeks ago, Darlene Grant had rqeuested a second reading on her breast MRI by Dr. Joneen Grant, she wants to know if this has happened yet.  She also wants to know if we have heard from Darlene Grant about her genetic testing.  She also wants to know more about  Darlene Taxol/Carboplatin which is going to happen after she completes Doxorubicin and Cyclophosphamide.     PAST MEDICAL HISTORY: Past Medical History:  Diagnosis Date  . Abnormal Pap smear    ASCUS 03/13/11  . Anemia   . Breast pain, left   . Cancer ([facility])   . Candida vaginitis   . Complication of anesthesia    pt states that all narcotics make her angry and she would prefer to avoid them.  . Rectal itching   . Vaginal Pap smear, abnormal     PAST SURGICAL HISTORY: Past Surgical History:  Procedure Laterality Date  . BREAST BIOPSY Right 8/16  . CESAREAN SECTION N/A 11/21/2013   Procedure: CESAREAN SECTION;  Surgeon: Darlene Pea, MD;  Location: [facility];  Service: Obstetrics;  Laterality: N/A;  . CESAREAN SECTION N/A 06/23/2016   Procedure: CESAREAN SECTION;  Surgeon: Darlene Filbert, MD;  Location: [facility];  Service: Obstetrics;  Laterality: N/A;  . HAND SURGERY  04/2012   ligament repair  . PILONIDAL CYST EXCISION  2000  . PORTACATH PLACEMENT N/A 05/28/2018   Procedure: INSERTION PORT-A-CATH WITH ULTRASOUND;  Surgeon: Darlene Bookbinder, MD;  Location: [facility];  Service: General;  Laterality: N/A;  . WISDOM TOOTH EXTRACTION  AGE 36    FAMILY HISTORY Family History  Problem Relation Age of Onset  . Cancer Mother 71       breast, premenopausal  . Breast cancer Mother   . Cancer Maternal Grandmother        skin, stomach  . Cancer Maternal Grandfather        lung    As of November 2019 her father is 55 years old with a history of Parkinson's. Patients' mother is 58 years old with a history of breast cancer, diagnosed age 79, treated with radical mastectomy. Darlene patient has no brothers and two sisters. Besides Darlene patient's mother, Darlene patient has no other family medical history of breast or ovarian cancer. Her maternal grandmother had stomach cancer and her maternal grandfather had lung cancer, --he was a smoker.    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  Her husband, Darlene Grant, an Chief Financial Officer. She has two children, [name], age 26, and [name], age 44 months.  Darlene patient is not a smoker. She does drink alcohol and drinks an estimated one to two drinks per week.        ADVANCED DIRECTIVES: Darlene Grant, her husband, is her healthcare department  HEALTH MAINTENANCE: Social History   Tobacco Use  . Smoking status: Never Smoker  . Smokeless tobacco: Never Used  Substance Use Topics  . Alcohol use: Yes    Alcohol/week: 0.0 standard drinks    Comment: occassionally  . Drug use: No     Colonoscopy: no  PAP:  12/2017  Bone density: no   Allergies  Allergen Reactions  . Augmentin [Amoxicillin-Pot Clavulanate]     diarrhea  . Hydrocodone Itching  . Keflex [Cephalexin] Diarrhea    Current Outpatient Medications  Medication Sig Dispense Refill  . Black Elderberry 50 MG/5ML SYRP Take by mouth.    . ciprofloxacin (CIPRO) 500 MG tablet Take one tablet by mouth 2 times daily for 5 days after each chemo cycle, starting on day 8 of chemo 40 tablet 1  . cyclobenzaprine (FLEXERIL) 10 MG tablet Take 0.5-1 tablets (5-10 mg total) by mouth 3 (three) times daily as needed for muscle spasms. 30 tablet 1  . dexamethasone (DECADRON) 4 MG tablet Take 2 tablets by mouth once a day on Darlene day after chemotherapy and then take 2 tablets two times a day for 2 days. Take with food. 30 tablet 1  . famotidine (PEPCID AC) 10 MG tablet     . lidocaine-prilocaine (EMLA) cream Apply to affected area once 30 g 3  . loratadine (CLARITIN) 10 MG tablet Take 1 tablet (10 mg total) by mouth daily. 90 tablet 0  . LORazepam (ATIVAN) 0.5 MG tablet Take 1 tablet (0.5 mg total) by mouth at bedtime as needed  (Nausea or vomiting). 30 tablet 0  . polyethylene glycol (MIRALAX) 0.34 gm/ml SOLN     . Probiotic Product (PROBIOTIC DAILY PO) Take 1 tablet by mouth.    . prochlorperazine (COMPAZINE) 10 MG tablet Take 1 tablet (10 mg total) by mouth every 6 (six) hours as needed (Nausea or vomiting). 30 tablet 1   No current facility-administered medications for this visit.     OBJECTIVE:  Vitals:   06/24/18 0958  BP: 125/77  Pulse: 76  Resp: 18  Temp: 99.6 F (37.6 C)  SpO2: 100%     Body mass index is 24.25 kg/m.   Wt Readings from Last 3 Encounters:  06/24/18 142 lb 6.4 oz (64.6 kg)  06/17/18 142 lb 11.2 oz (64.7 kg)  06/10/18 143 lb 9.6 oz (65.1 kg)  ECOG FS:1 GENERAL: Patient is a well appearing female in no acute distress HEENT:  Sclerae anicteric.  Oropharynx clear and moist. No ulcerations or evidence of oropharyngeal candidiasis. Neck is supple.  NODES:  No cervical, supraclavicular, or axillary lymphadenopathy palpated. BREAST EXAM:  Unable to palpate mass LUNGS:  Clear to auscultation bilaterally.  No wheezes or rhonchi. HEART:  Regular rate and rhythm. No murmur appreciated. ABDOMEN:  Soft, nontender.  Positive, normoactive bowel sounds. No organomegaly palpated. MSK:  No focal spinal tenderness to palpation. Full range of motion bilaterally in Darlene upper extremities. EXTREMITIES:  No peripheral edema.   SKIN:  Clear with no obvious rashes or skin changes. No nail dyscrasia.  Very faint erythema around port site about size of a quarter. NEURO:  Nonfocal. Well oriented.  Appropriate affect.       LAB RESULTS:  CMP     Component Value Date/Time   NA 139 06/24/2018 0931   K 4.4 06/24/2018 0931   CL 104 06/24/2018 0931   CO2 27 06/24/2018 0931   GLUCOSE 101 (H) 06/24/2018 0931   BUN 12 06/24/2018 0931   CREATININE 0.69 06/24/2018 0931   CREATININE 0.76 05/15/2018 0827   CREATININE 0.78 01/15/2018 1033   CALCIUM 9.6 06/24/2018 0931   PROT 6.7 06/24/2018 0931    ALBUMIN 4.1 06/24/2018 0931   AST 10 (L) 06/24/2018 0931   AST 10 (L) 05/15/2018 0827   ALT 9 06/24/2018 0931  ALT 9 05/15/2018 0827   ALKPHOS 95 06/24/2018 0931   BILITOT 0.9 06/24/2018 0931   BILITOT 1.1 05/15/2018 0827   GFRNONAA >60 06/24/2018 0931   GFRNONAA >60 05/15/2018 0827   GFRNONAA 95 01/15/2018 1033   GFRAA >60 06/24/2018 0931   GFRAA >60 05/15/2018 0827   GFRAA 110 01/15/2018 1033    No results found for: TOTALPROTELP, ALBUMINELP, A1GS, A2GS, BETS, BETA2SER, GAMS, MSPIKE, SPEI  No results found for: KPAFRELGTCHN, LAMBDASER, KAPLAMBRATIO  Lab Results  Component Value Date   WBC 2.3 (L) 06/24/2018   NEUTROABS PENDING 06/24/2018   HGB 10.2 (L) 06/24/2018   HCT 30.2 (L) 06/24/2018   MCV 92.4 06/24/2018   PLT 198 06/24/2018    '@LASTCHEMISTRY'$ @  No results found for: LABCA2  No components found for: [id number]  No results for input(s): INR in Darlene last 168 hours.  No results found for: LABCA2  No results found for: MVH846  No results found for: NGE952  No results found for: WUX324  No results found for: CA2729  No components found for: HGQUANT  No results found for: CEA1 / No results found for: CEA1   No results found for: AFPTUMOR  No results found for: CHROMOGRNA  No results found for: PSA1  Appointment on 06/24/2018  Component Date Value Ref Range Status  . Sodium 06/24/2018 139  135 - 145 mmol/L Final  . Potassium 06/24/2018 4.4  3.5 - 5.1 mmol/L Final  . Chloride 06/24/2018 104  98 - 111 mmol/L Final  . CO2 06/24/2018 27  22 - 32 mmol/L Final  . Glucose, Bld 06/24/2018 101* 70 - 99 mg/dL Final  . BUN 06/24/2018 12  6 - 20 mg/dL Final  . Creatinine, Ser 06/24/2018 0.69  0.44 - 1.00 mg/dL Final  . Calcium 06/24/2018 9.6  8.9 - 10.3 mg/dL Final  . Total Protein 06/24/2018 6.7  6.5 - 8.1 g/dL Final  . Albumin 06/24/2018 4.1  3.5 - 5.0 g/dL Final  . AST 06/24/2018 10* 15 - 41 U/L Final  . ALT 06/24/2018 9  0 - 44 U/L Final  . Alkaline  Phosphatase 06/24/2018 95  38 - 126 U/L Final  . Total Bilirubin 06/24/2018 0.9  0.3 - 1.2 mg/dL Final  . GFR calc non Af Amer 06/24/2018 >60  >60 mL/min Final  . GFR calc Af Amer 06/24/2018 >60  >60 mL/min Final  . Anion gap 06/24/2018 8  5 - 15 Final   Performed at [facility], [place] [address], [place], [place] [address]  . WBC 06/24/2018 2.3* 4.0 - 10.5 K/uL Final  . RBC 06/24/2018 3.27* 3.87 - 5.11 MIL/uL Final  . Hemoglobin 06/24/2018 10.2* 12.0 - 15.0 g/dL Final  . HCT 06/24/2018 30.2* 36.0 - 46.0 % Final  . MCV 06/24/2018 92.4  80.0 - 100.0 fL Final  . MCH 06/24/2018 31.2  26.0 - 34.0 pg Final  . MCHC 06/24/2018 33.8  30.0 - 36.0 g/dL Final  . RDW 06/24/2018 12.9  11.5 - 15.5 % Final  . Platelets 06/24/2018 198  150 - 400 K/uL Final  . nRBC 06/24/2018 0.0  0.0 - 0.2 % Final   Performed at [facility], [place] [address] [place], [place] [address]  . Neutrophils Relative % 06/24/2018 PENDING  % Incomplete  . Neutro Abs 06/24/2018 PENDING  1.7 - 7.7 K/uL Incomplete  . Band Neutrophils 06/24/2018 PENDING  % Incomplete  . Lymphocytes Relative 06/24/2018 PENDING  % Incomplete  .  Lymphs Abs 06/24/2018 PENDING  0.7 - 4.0 K/uL Incomplete  . Monocytes Relative 06/24/2018 PENDING  % Incomplete  . Monocytes Absolute 06/24/2018 PENDING  0.1 - 1.0 K/uL Incomplete  . Eosinophils Relative 06/24/2018 PENDING  % Incomplete  . Eosinophils Absolute 06/24/2018 PENDING  0.0 - 0.5 K/uL Incomplete  . Basophils Relative 06/24/2018 PENDING  % Incomplete  . Basophils Absolute 06/24/2018 PENDING  0.0 - 0.1 K/uL Incomplete  . WBC Morphology 06/24/2018 PENDING   Incomplete  . RBC Morphology 06/24/2018 PENDING   Incomplete  . Smear Review 06/24/2018 PENDING   Incomplete  . Other 06/24/2018 PENDING  % Incomplete  . nRBC 06/24/2018 PENDING  0 /100 WBC Incomplete  . Metamyelocytes Relative 06/24/2018 PENDING  % Incomplete  . Myelocytes 06/24/2018 PENDING  %  Incomplete  . Promyelocytes Relative 06/24/2018 PENDING  % Incomplete  . Blasts 06/24/2018 PENDING  % Incomplete    (this displays Darlene last labs from Darlene last 3 days)  No results found for: TOTALPROTELP, ALBUMINELP, A1GS, A2GS, BETS, BETA2SER, GAMS, MSPIKE, SPEI (this displays SPEP labs)  No results found for: KPAFRELGTCHN, LAMBDASER, KAPLAMBRATIO (kappa/lambda light chains)  No results found for: HGBA, HGBA2QUANT, HGBFQUANT, HGBSQUAN (Hemoglobinopathy evaluation)   No results found for: LDH  Lab Results  Component Value Date   IRON 59 12/27/2016   TIBC 329 12/27/2016   IRONPCTSAT 18 12/27/2016   (Iron and TIBC)  Lab Results  Component Value Date   FERRITIN 24 01/15/2018    Urinalysis    Component Value Date/Time   COLORURINE YELLOW 12/18/2016 [place] 12/18/2016 1607   LABSPEC 1.010 12/18/2016 1607   PHURINE 7.0 12/18/2016 1607   GLUCOSEU NEGATIVE 12/18/2016 1607   HGBUR NEGATIVE 12/18/2016 1607   HGBUR negative 01/13/2010 0940   BILIRUBINUR NEGATIVE 12/18/2016 1607   BILIRUBINUR neg 05/17/2015 1034   KETONESUR NEGATIVE 12/18/2016 1607   PROTEINUR NEGATIVE 12/18/2016 1607   UROBILINOGEN 0.2 05/17/2015 1034   UROBILINOGEN 0.2 01/13/2010 0940   NITRITE NEGATIVE 12/18/2016 1607   LEUKOCYTESUR NEGATIVE 12/18/2016 1607     STUDIES:  Dg Chest 1 View  Result Date: 05/28/2018 CLINICAL DATA:  Right-sided Port-A-Cath insertion. EXAM: CHEST  1 VIEW COMPARISON:  No recent prior. FINDINGS: Port-A-Cath noted with tip over superior vena cava. No pneumothorax noted. Surgical sponge and clips noted over Darlene right lower chest. IMPRESSION: Port-A-Cath noted with tip over superior vena cava. Electronically Signed   By: Marcello Moores  Register   On: 05/28/2018 13:00   Ct Chest W Contrast  Result Date: 05/29/2018 CLINICAL DATA:  Newly diagnosed right breast cancer, for staging. Chemotherapy starting next week. EXAM: CT CHEST, ABDOMEN, AND PELVIS WITH CONTRAST  TECHNIQUE: Multidetector CT imaging of Darlene chest, abdomen and pelvis was performed following Darlene standard protocol during bolus administration of intravenous contrast. CONTRAST:  127m OMNIPAQUE IOHEXOL 300 MG/ML  SOLN COMPARISON:  None. FINDINGS: CT CHEST FINDINGS Cardiovascular: Darlene heart is normal in size. No pericardial effusion. No evidence of thoracic aortic aneurysm. Right chest port terminates in Darlene lower SVC. Mediastinum/Nodes: Right axillary nodes measuring up to 10 mm short axis (series 2/image 18), suspicious. Additional right subpectoral nodes measuring up to 7 mm short axis (series 2/image 12), suspicious. No suspicious mediastinal or hilar lymphadenopathy. Visualized thyroid is unremarkable. Lungs/Pleura: Lungs are clear. No suspicious pulmonary nodules. No focal consolidation. No pleural effusion or pneumothorax. Musculoskeletal: 2.1 cm lateral right breast mass (series 2/image 22), likely corresponding to Darlene patient's known newly diagnosed breast cancer. Visualized osseous structures  are within normal limits. CT ABDOMEN PELVIS FINDINGS Hepatobiliary: Liver is within normal limits. Gallbladder is unremarkable. No intrahepatic or extrahepatic ductal dilatation. Pancreas: Within normal limits. Spleen: Within normal limits. Adrenals/Urinary Tract: Adrenal glands are within normal limits. Kidneys are within normal limits.  No hydronephrosis. Bladder is underdistended but unremarkable. Stomach/Bowel: Stomach is within normal limits. No evidence of bowel obstruction. Normal appendix (series 2/image 36). Vascular/Lymphatic: No evidence of abdominal aortic aneurysm. No suspicious abdominopelvic lymphadenopathy. Reproductive: Uterus is within normal limits. Bilateral ovaries are unremarkable, noting a right corpus luteum. Other: No abdominopelvic ascites. Musculoskeletal: Visualized osseous structures are within normal limits. IMPRESSION: 2.1 cm lateral right breast mass, likely corresponding to Darlene  patient's known newly diagnosed breast cancer. Associated right axillary and subpectoral nodal metastases. No evidence of distant metastases. Electronically Signed   By: Julian Hy M.D.   On: 05/29/2018 11:12   Nm Bone Scan Whole Body  Result Date: 05/29/2018 CLINICAL DATA:  Invasive RIGHT breast cancer, new diagnosis EXAM: NUCLEAR MEDICINE WHOLE BODY BONE SCAN TECHNIQUE: Whole body anterior and posterior images were obtained approximately 3 hours after intravenous injection of radiopharmaceutical. RADIOPHARMACEUTICALS:  21.8 mCi Technetium-29mMDP IV COMPARISON:  None Radiographic correlation: CT chest abdomen pelvis 05/29/2018 FINDINGS: Normal tracer localization throughout Darlene axial and appendicular skeleton. Questionable subtle focus of increased tracer localization at Darlene proximal to mid RIGHT femoral diaphysis. No additional sites of abnormal osseous tracer accumulation are identified. Expected urinary tract and soft tissue distribution of tracer. IMPRESSION: Single questionable focus of abnormal osseous tracer accumulation at Darlene proximal to mid RIGHT femoral diaphysis, metastatic focus not excluded.; Recommend radiographic correlation. Remainder of exam normal. Electronically Signed   By: MLavonia DanaM.D.   On: 05/29/2018 16:14   Ct Abdomen Pelvis W Contrast  Result Date: 05/29/2018 CLINICAL DATA:  Newly diagnosed right breast cancer, for staging. Chemotherapy starting next week. EXAM: CT CHEST, ABDOMEN, AND PELVIS WITH CONTRAST TECHNIQUE: Multidetector CT imaging of Darlene chest, abdomen and pelvis was performed following Darlene standard protocol during bolus administration of intravenous contrast. CONTRAST:  1060mOMNIPAQUE IOHEXOL 300 MG/ML  SOLN COMPARISON:  None. FINDINGS: CT CHEST FINDINGS Cardiovascular: Darlene heart is normal in size. No pericardial effusion. No evidence of thoracic aortic aneurysm. Right chest port terminates in Darlene lower SVC. Mediastinum/Nodes: Right axillary nodes  measuring up to 10 mm short axis (series 2/image 18), suspicious. Additional right subpectoral nodes measuring up to 7 mm short axis (series 2/image 12), suspicious. No suspicious mediastinal or hilar lymphadenopathy. Visualized thyroid is unremarkable. Lungs/Pleura: Lungs are clear. No suspicious pulmonary nodules. No focal consolidation. No pleural effusion or pneumothorax. Musculoskeletal: 2.1 cm lateral right breast mass (series 2/image 22), likely corresponding to Darlene patient's known newly diagnosed breast cancer. Visualized osseous structures are within normal limits. CT ABDOMEN PELVIS FINDINGS Hepatobiliary: Liver is within normal limits. Gallbladder is unremarkable. No intrahepatic or extrahepatic ductal dilatation. Pancreas: Within normal limits. Spleen: Within normal limits. Adrenals/Urinary Tract: Adrenal glands are within normal limits. Kidneys are within normal limits.  No hydronephrosis. Bladder is underdistended but unremarkable. Stomach/Bowel: Stomach is within normal limits. No evidence of bowel obstruction. Normal appendix (series 2/image 36). Vascular/Lymphatic: No evidence of abdominal aortic aneurysm. No suspicious abdominopelvic lymphadenopathy. Reproductive: Uterus is within normal limits. Bilateral ovaries are unremarkable, noting a right corpus luteum. Other: No abdominopelvic ascites. Musculoskeletal: Visualized osseous structures are within normal limits. IMPRESSION: 2.1 cm lateral right breast mass, likely corresponding to Darlene patient's known newly diagnosed breast cancer. Associated right axillary and subpectoral  nodal metastases. No evidence of distant metastases. Electronically Signed   By: Julian Hy M.D.   On: 05/29/2018 11:12   Mr Breast Bilateral W [place]  Addendum Date: 05/31/2018   ADDENDUM REPORT: 05/31/2018 09:37 ADDENDUM: Within Darlene FINDINGS section of Darlene RIGHT BREAST, Darlene non masslike enhancement is located throughout Darlene UPPER-OUTER RIGHT BREAST,  instead of Darlene LEFT breast as on Darlene original report. Electronically Signed   By: Margarette Canada M.D.   On: 05/31/2018 09:37   Result Date: 05/31/2018 CLINICAL DATA:  43 year old female with newly diagnosed invasive RIGHT breast cancer with metastasis to RIGHT axillary lymph node. LABS:  None performed today EXAM: BILATERAL BREAST MRI WITH AND WITHOUT CONTRAST TECHNIQUE: Multiplanar, multisequence MR images of both breasts were obtained prior to and following Darlene intravenous administration of 7 ml of Gadavist Three-dimensional MR images were rendered by post-processing of Darlene original MR data on an independent workstation. Darlene three-dimensional MR images were interpreted, and findings are reported in Darlene following complete MRI report for this study. Three dimensional images were evaluated at Darlene independent DynaCad workstation COMPARISON:  Previous exam(s). FINDINGS: Breast composition: c. Heterogeneous fibroglandular tissue. Background parenchymal enhancement: Mild Right breast: A 1.7 x 3.7 cm enhancing mass within Darlene UPPER-OUTER RIGHT breast contains biopsy clip artifact and compatible with biopsy-proven malignancy. Biopsy clip artifact within a 1.3 cm mass/lymph node in Darlene axillary tail is compatible with biopsy-proven malignancy. Non masslike enhancement throughout Darlene UPPER OUTER LEFT breast has similar enhancement characteristics as Darlene biopsy-proven breast malignancy, extending at least 5 cm inferomedially from Darlene biopsy-proven malignancy. Darlene entire area of non masslike enhancement and biopsy-proven malignancy measures 3.5 x 7 x 6.5 cm (transverse x AP x CC). No other areas of abnormal enhancement are noted. Left breast: No mass or abnormal enhancement. Lymph nodes: 7 abnormal appearing RIGHT axillary lymph nodes are identified in include level 1, level 2 and level 3 nodes. No other abnormal lymph nodes are identified. Ancillary findings:  None. IMPRESSION: 1. 3.7 cm biopsy-proven UPPER-OUTER RIGHT breast  malignancy and 1.3 cm biopsy-proven malignancy/metastatic lymph node in Darlene RIGHT axillary tail. 2. Abnormal non masslike enhancement throughout Darlene UPPER-OUTER RIGHT breast with similar enhancement characteristics to Darlene biopsy-proven malignancy. This non masslike enhancement extends 5 cm inferomedially from Darlene biopsy-proven RIGHT breast malignancy, with Darlene entire area including Darlene biopsy-proven malignancy measuring 3.5 x 7 x 6.5 cm. Recommend tissue sampling of Darlene anterior aspect of Darlene suspicious non masslike enhancement if breast conservation is desired. 3. Seven abnormal appearing RIGHT axillary lymph nodes involving level 1, level 2 and level 3. 4. No MR evidence of LEFT breast malignancy. RECOMMENDATION: 1. If breast conservation is desired, recommend MR guided biopsy of Darlene anterior aspect of UPPER-OUTER RIGHT breast non masslike enhancement. 2. Treatment plan otherwise. BI-RADS CATEGORY  4: Suspicious. Electronically Signed: By: Margarette Canada M.D. On: 05/28/2018 14:42   Dg Fluoro Guide Cv Line Right  Result Date: 05/28/2018 CLINICAL DATA:  Port catheter placement EXAM: CENTRAL VENOUS CATHETER WITH FLUOROSCOPY FINDINGS: Right IJ power injectable Port-A-Cath noted with tip over superior vena cava. No pneumothorax noted. Surgical sponge and clips noted over Darlene right lower chest. IMPRESSION: Port-A-Cath noted with tip over superior vena cava. Electronically Signed   By: Lucrezia Europe M.D.   On: 05/28/2018 13:51   Dg Femur, Min 2 Views Right  Result Date: 06/10/2018 CLINICAL DATA:  Right leg pain and abnormal bone scan EXAM: RIGHT FEMUR 2 VIEWS COMPARISON:  None. FINDINGS: No  acute fracture or dislocation is noted. No gross soft tissue abnormality is noted. No definitive lytic or sclerotic lesion is identified to correspond with that seen on recent bone scan. IMPRESSION: No acute abnormality noted. No focal abnormality to correspond with findings of recent bone scan. Electronically Signed   By: Inez Catalina M.D.   On: 06/10/2018 21:40    ELIGIBLE FOR AVAILABLE RESEARCH PROTOCOL: Upbeat  ASSESSMENT: 43 y.o. [facility] woman, status post right breast upper outer quadrant and right axillary lymph node biopsy 05/03/2018 for a clinical T2 N2, stage IIIC invasive ductal carcinoma, triple negative, with an MIB-1 of 75%  (a) staging studies with CT scans of Darlene chest abdomen and pelvis on 05/29/18 show no distant metastases; bone scan shows questionable focus of tracer accumulation on Darlene proximal to mid right femoral diaphysis  (b) MRI breast on 05/31/18 shows additional non mass like enhancement throughout upper outer right breast that extends 5cm inferomedially from biopsy proven malignancy.    (1) genetics testing 06/06/2018--at [facility]--negative 11 gene panel   (2) neoadjuvant chemotherapy consisting of doxorubicin and cyclophosphamide in dose dense fashion x4 started 06/03/2018, to be  followed by paclitaxel and carboplatin weekly x12 (a) Echo on 05/23/2018 shows an EF of 60-65%.  (3) definitive surgery to follow (patient is planning on bilateral mastectomies)  (4) adjuvant radiation to follow surgery  PLAN:  Darlene Grant is doing moderately well today. I am very happy that it is becoming more and more difficult to palpate any evidence of breast cancer on physical exam, particularly after 2 cycles of chemotherapy.  This is good news.    She did have quite Darlene response to miralax, however it could have certainly been a combination of Darlene miralax, her diet, and taking Darlene anti nausea medication less.  I am happy to say that her diarrhea has resolved, she isn't having any abdominal pain, and her electrolytes and kidney function is normal today.  I let her know that in Darlene future if she needs to take imodium, it is our first choice in anti diarrhea medications, and how she took it, is how we typically recommend it be taken.    Hilary and I reviewed her questions above as well.  I  reviewed her normal genetic testing results as noted in care everywhere.  We reviewed her next chemotherapy treatment with Paclitaxel and Carboplatin and potential adverse effects in detail.  We discussed her CBC today.  Her [place] has not yet resulted, so I cautioned her to stay off of any Cipro until we have those results.    We reviewed her menstrual cycle returning.  At this point she is managing it well.  Should her cycle become problematic, or bothersome she can always receive Goserelin injections.  I reviewed this with her in detail today and she will keep this in mind.   Hilary and I reviewed Darlene fact that she has a slight low grade fever initially and has some very faint erythema over her port site.  I let her know to keep a close eye on this.  She noted today when she applied Darlene emla cream that it was burning a little bit, and wonders could it possibly be irritation.  She will let me know if it gets worse at all.  We rechecked her temperature today prior to her leaving and it was 98.4.  We will call her with her [place] today.    Darlene Grant will return next week for labs, f/u, and  cycle 3 of Doxorubicin and Cyclophosphamide.  She knows to call for any questions or concerns prior to her next appointment with [place].    A total of (30) minutes of face-to-face time was spent with this patient with greater than 50% of that time in counseling and care-coordination.   Wilber Bihari, NP  06/24/18 10:38 AM Medical Oncology and Hematology [facility] [address] [place], [place] [address] Tel. [phone]    Fax. [phone]

## 2018-06-25 ENCOUNTER — Telehealth: Payer: Self-pay | Admitting: Adult Health

## 2018-06-25 NOTE — Telephone Encounter (Signed)
Per 1/6 no los °

## 2018-06-26 ENCOUNTER — Telehealth: Payer: Self-pay

## 2018-06-26 NOTE — Telephone Encounter (Signed)
Nurse returned called to patient regarding redness at port.  Patient states redness still present at port, improvement since last visit but still showing redness.  Patient denies any fevers.  Area is sensitive to touch.    Patient following up via recommendations to see if she can have an antibiotic.  Patient states she is suppose to have chemotherapy on Monday, and wants to ensure no infection.  Will forward to provider for recommendations.

## 2018-06-26 NOTE — Telephone Encounter (Signed)
Nurse reviewed previous phone call with Wilber Bihari, NP.  Per Mendel Ryder okay to hold off any antibiotics since patient's redness has decreased and that she is having no fevers.  Per Mendel Ryder will have her nurse follow up with patient on 06/27/2018 to ensure stability.  Nurse encouraged patient to continue to monitor temp.  Voiced understanding, no further needs at this time.

## 2018-06-27 ENCOUNTER — Telehealth: Payer: Self-pay

## 2018-06-27 NOTE — Telephone Encounter (Signed)
Nurse spoke to patient this morning.  She reports that her port sight is getting better and is only slightly pink.  She denies any fever.  Encouraged patient to call us back if anything changes.  She voiced understanding. Patient will talk to NP on Monday at her appt.  No further needs at this time.

## 2018-06-29 ENCOUNTER — Encounter: Payer: Self-pay | Admitting: Adult Health

## 2018-07-01 ENCOUNTER — Encounter: Payer: Self-pay | Admitting: Adult Health

## 2018-07-01 ENCOUNTER — Inpatient Hospital Stay: Payer: BLUE CROSS/BLUE SHIELD

## 2018-07-01 ENCOUNTER — Inpatient Hospital Stay (HOSPITAL_BASED_OUTPATIENT_CLINIC_OR_DEPARTMENT_OTHER): Payer: BLUE CROSS/BLUE SHIELD | Admitting: Adult Health

## 2018-07-01 VITALS — BP 107/75 | HR 82 | Temp 98.5°F | Resp 20 | Ht 64.25 in | Wt 142.1 lb

## 2018-07-01 DIAGNOSIS — Z95828 Presence of other vascular implants and grafts: Secondary | ICD-10-CM

## 2018-07-01 DIAGNOSIS — Z5111 Encounter for antineoplastic chemotherapy: Secondary | ICD-10-CM | POA: Diagnosis not present

## 2018-07-01 DIAGNOSIS — Z171 Estrogen receptor negative status [ER-]: Secondary | ICD-10-CM | POA: Diagnosis not present

## 2018-07-01 DIAGNOSIS — Z452 Encounter for adjustment and management of vascular access device: Secondary | ICD-10-CM | POA: Diagnosis not present

## 2018-07-01 DIAGNOSIS — Z5189 Encounter for other specified aftercare: Secondary | ICD-10-CM | POA: Diagnosis not present

## 2018-07-01 DIAGNOSIS — R110 Nausea: Secondary | ICD-10-CM | POA: Diagnosis not present

## 2018-07-01 DIAGNOSIS — E860 Dehydration: Secondary | ICD-10-CM | POA: Diagnosis not present

## 2018-07-01 DIAGNOSIS — C50411 Malignant neoplasm of upper-outer quadrant of right female breast: Secondary | ICD-10-CM | POA: Diagnosis not present

## 2018-07-01 LAB — CBC WITH DIFFERENTIAL/PLATELET
Abs Immature Granulocytes: 0.8 10*3/uL — ABNORMAL HIGH (ref 0.00–0.07)
Basophils Absolute: 0.1 10*3/uL (ref 0.0–0.1)
Basophils Relative: 1 %
Eosinophils Absolute: 0 10*3/uL (ref 0.0–0.5)
Eosinophils Relative: 0 %
HEMATOCRIT: 29 % — AB (ref 36.0–46.0)
HEMOGLOBIN: 9.8 g/dL — AB (ref 12.0–15.0)
Immature Granulocytes: 10 %
Lymphocytes Relative: 14 %
Lymphs Abs: 1.1 10*3/uL (ref 0.7–4.0)
MCH: 31 pg (ref 26.0–34.0)
MCHC: 33.8 g/dL (ref 30.0–36.0)
MCV: 91.8 fL (ref 80.0–100.0)
MONO ABS: 0.6 10*3/uL (ref 0.1–1.0)
Monocytes Relative: 8 %
Neutro Abs: 5.1 10*3/uL (ref 1.7–7.7)
Neutrophils Relative %: 67 %
Platelets: 182 10*3/uL (ref 150–400)
RBC: 3.16 MIL/uL — ABNORMAL LOW (ref 3.87–5.11)
RDW: 13.9 % (ref 11.5–15.5)
WBC: 7.7 10*3/uL (ref 4.0–10.5)
nRBC: 0 % (ref 0.0–0.2)

## 2018-07-01 LAB — COMPREHENSIVE METABOLIC PANEL
ALK PHOS: 70 U/L (ref 38–126)
ALT: 8 U/L (ref 0–44)
AST: 9 U/L — ABNORMAL LOW (ref 15–41)
Albumin: 4.2 g/dL (ref 3.5–5.0)
Anion gap: 6 (ref 5–15)
BUN: 14 mg/dL (ref 6–20)
CO2: 27 mmol/L (ref 22–32)
Calcium: 8.9 mg/dL (ref 8.9–10.3)
Chloride: 106 mmol/L (ref 98–111)
Creatinine, Ser: 0.72 mg/dL (ref 0.44–1.00)
GFR calc Af Amer: >60 mL/min (ref >60)
GFR calc non Af Amer: >60 mL/min (ref >60)
Glucose, Bld: 96 mg/dL (ref 70–99)
Potassium: 4 mmol/L (ref 3.5–5.1)
Sodium: 139 mmol/L (ref 135–145)
Total Bilirubin: 0.4 mg/dL (ref 0.3–1.2)
Total Protein: 6.5 g/dL (ref 6.5–8.1)

## 2018-07-01 MED ORDER — SODIUM CHLORIDE 0.9 % IV SOLN
Freq: Once | INTRAVENOUS | Status: AC
  Administered 2018-07-01: 11:00:00 via INTRAVENOUS
  Filled 2018-07-01: qty 250

## 2018-07-01 MED ORDER — SODIUM CHLORIDE 0.9 % IV SOLN
600.0000 mg/m2 | Freq: Once | INTRAVENOUS | Status: AC
  Administered 2018-07-01: 1040 mg via INTRAVENOUS
  Filled 2018-07-01: qty 52

## 2018-07-01 MED ORDER — SODIUM CHLORIDE 0.9% FLUSH
10.0000 mL | INTRAVENOUS | Status: DC | PRN
  Administered 2018-07-01: 10 mL
  Filled 2018-07-01: qty 10

## 2018-07-01 MED ORDER — DOXORUBICIN HCL CHEMO IV INJECTION 2 MG/ML
60.0000 mg/m2 | Freq: Once | INTRAVENOUS | Status: AC
  Administered 2018-07-01: 104 mg via INTRAVENOUS
  Filled 2018-07-01: qty 52

## 2018-07-01 MED ORDER — PALONOSETRON HCL INJECTION 0.25 MG/5ML
0.2500 mg | Freq: Once | INTRAVENOUS | Status: AC
  Administered 2018-07-01: 0.25 mg via INTRAVENOUS

## 2018-07-01 MED ORDER — ACETAMINOPHEN 325 MG PO TABS
ORAL_TABLET | ORAL | Status: AC
  Filled 2018-07-01: qty 2

## 2018-07-01 MED ORDER — ACETAMINOPHEN 325 MG PO TABS
650.0000 mg | ORAL_TABLET | Freq: Once | ORAL | Status: AC | PRN
  Administered 2018-07-01: 650 mg via ORAL

## 2018-07-01 MED ORDER — SODIUM CHLORIDE 0.9 % IV SOLN
Freq: Once | INTRAVENOUS | Status: AC
  Administered 2018-07-01: 11:00:00 via INTRAVENOUS
  Filled 2018-07-01: qty 5

## 2018-07-01 MED ORDER — PALONOSETRON HCL INJECTION 0.25 MG/5ML
INTRAVENOUS | Status: AC
  Filled 2018-07-01: qty 5

## 2018-07-01 MED ORDER — HEPARIN SOD (PORK) LOCK FLUSH 100 UNIT/ML IV SOLN
500.0000 [IU] | Freq: Once | INTRAVENOUS | Status: AC | PRN
  Administered 2018-07-01: 500 [IU]
  Filled 2018-07-01: qty 5

## 2018-07-01 NOTE — Progress Notes (Signed)
[facility]  Telephone:([phone] [phone]     ID: Darlene Grant DOB: April 28, 1976  MR#: [id number]  GEZ#:[id number]  Patient Care Team: Lavada Mesi as PCP - General (Family Medicine) Rolm Bookbinder, MD as Consulting Physician (General Surgery) Magrinat, Virgie Dad, MD as Consulting Physician (Oncology) Gery Pray, MD as Consulting Physician (Radiation Oncology) Emily Filbert, MD as Consulting Physician (Obstetrics and Gynecology) Lavonna Monarch, MD as Consulting Physician (Dermatology) OTHER MD:   CHIEF COMPLAINT: Triple negative breast cancer  CURRENT TREATMENT: Neoadjuvant chemotherapy   HISTORY OF CURRENT ILLNESS: From the original intake note:  Darlene Grant had routine screening mammography on 04/19/2018 showing a possible abnormality in the right breast. She underwent unilateral right diagnostic mammography with tomography and right breast ultrasonography at [facility] on 05/02/2018 showing: Breast Density Category C: There was an oval circumscribed mass in the posterior aspect of the upper outer right breast.  On exam this measured 3.5 cm, was firm, irregular and centered on 10-11 o'clock 5 cm from the nipple.  By ultrasonography there was a 3.7 x 1.7 x 1.5 cm elongated, irregular, hypoechoic mass in the 10:30 o'clock position of the right breast, 5 cm from the nipple.  Ultrasound of the right axilla showed 7 abnormal right axillary lymph nodes highly suspicious for metastatic nodes.  Accordingly on 05/03/2018 she proceeded to biopsy of the right breast area in question and one suspicious lymph node. The pathology from this procedure showed ([id number]): Right Breast: invasive mammary carcinoma, grade 3, E-cadherin positive (and therefore ductal) lymphovascular invasion present. Lymph node: Invasive mammary carcinoma Prognostic indicators significant for: estrogen receptor, 0% negative and progesterone  receptor, 0% negative. Proliferation marker Ki67 at 75%. HER2 negative (1+).  The patient's subsequent history is as detailed below.  INTERVAL HISTORY: Darlene Grant returns today for follow-up of her triple negative breast cancer.  The patient continues on neoadjuvant chemotherapy, consisting of doxorubicin and cyclophosphamide in dose dense fashion x4. Today is day 1 cycle 3.    REVIEW OF SYSTEMS: Darlene Grant is doing well today.  At her last appointment and again today after having her port accessed she noted some burning at the port site.  There has been no swelling or tenderness to the site.  She has not had any fevers or chills. Darlene Grant wants to know the status of the over read by Dr. Joneen Caraway on her breast MRI.  She hasn't seen an update on this in mychart and wants to know if I can see the status on this.  Darlene Grant also wants to know if she can change her chemo day to Fridays for her Paclitaxel and Carboplatin.   Darlene Grant denies any new issues today.  She can no longer feel her breast mass.  She denies any nausea, vomiting, bowel/bladder changes.  She is without chest pain, palpitations, cough, shortness of breath.  A detailed ROS was conducted and was otherwise non contributory.     PAST MEDICAL HISTORY: Past Medical History:  Diagnosis Date  . Abnormal Pap smear    ASCUS 03/13/11  . Anemia   . Breast pain, left   . Cancer ([place])   . Candida vaginitis   . Complication of anesthesia    pt states that all narcotics make her angry and she would prefer to avoid them.  . Rectal itching   . Vaginal Pap smear, abnormal     PAST SURGICAL HISTORY: Past Surgical History:  Procedure Laterality Date  . BREAST BIOPSY Right 8/16  .  CESAREAN SECTION N/A 11/21/2013   Procedure: CESAREAN SECTION;  Surgeon: Alwyn Pea, MD;  Location: [facility];  Service: Obstetrics;  Laterality: N/A;  . CESAREAN SECTION N/A 06/23/2016   Procedure: CESAREAN SECTION;  Surgeon: Emily Filbert, MD;  Location: [facility];   Service: Obstetrics;  Laterality: N/A;  . HAND SURGERY  04/2012   ligament repair  . PILONIDAL CYST EXCISION  2000  . PORTACATH PLACEMENT N/A 05/28/2018   Procedure: INSERTION PORT-A-CATH WITH ULTRASOUND;  Surgeon: Rolm Bookbinder, MD;  Location: [facility];  Service: General;  Laterality: N/A;  . WISDOM TOOTH EXTRACTION  AGE 9    FAMILY HISTORY Family History  Problem Relation Age of Onset  . Cancer Mother 14       breast, premenopausal  . Breast cancer Mother   . Cancer Maternal Grandmother        skin, stomach  . Cancer Maternal Grandfather        lung    As of November 2019 her father is 40 years old with a history of Parkinson's. Patients' mother is 27 years old with a history of breast cancer, diagnosed age 55, treated with radical mastectomy. The patient has no brothers and two sisters. Besides the patient's mother, the patient has no other family medical history of breast or ovarian cancer. Her maternal grandmother had stomach cancer and her maternal grandfather had lung cancer, --he was a smoker.    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  Her husband, Darlene Grant, an Chief Financial Officer. She has two children, Darlene Grant, age 20, and Darlene Grant, age 74 months.  The patient is not a smoker. She does drink alcohol and drinks an estimated one to two drinks per week.        ADVANCED DIRECTIVES: Darlene Grant, her husband, is her healthcare department  HEALTH MAINTENANCE: Social History   Tobacco Use  . Smoking status: Never Smoker  . Smokeless tobacco: Never Used  Substance Use Topics  . Alcohol use: Yes    Alcohol/week: 0.0 standard drinks    Comment: occassionally  . Drug use: No     Colonoscopy: no  PAP:  12/2017  Bone density: no   Allergies  Allergen Reactions  . Augmentin [Amoxicillin-Pot Clavulanate]     diarrhea  . Hydrocodone Itching  . Keflex [Cephalexin] Diarrhea    Current Outpatient Medications  Medication Sig Dispense Refill  . Black Elderberry 50 MG/5ML SYRP Take by mouth.    . ciprofloxacin (CIPRO) 500 MG tablet Take one tablet by mouth 2 times daily for 5 days after each chemo cycle, starting on day 8 of chemo 40 tablet 1  . cyclobenzaprine (FLEXERIL) 10 MG tablet Take 0.5-1 tablets (5-10 mg total) by mouth 3 (three) times daily as needed for muscle spasms. 30 tablet 1  . dexamethasone (DECADRON) 4 MG tablet Take 2 tablets by mouth once a day on the day after chemotherapy and then take 2 tablets two times a day for 2 days. Take with food. 30 tablet 1  . famotidine (PEPCID AC) 10 MG tablet     . lidocaine-prilocaine (EMLA) cream  Apply to affected area once 30 g 3  . loratadine (CLARITIN) 10 MG tablet Take 1 tablet (10 mg total) by mouth daily. 90 tablet 0  . LORazepam (ATIVAN) 0.5 MG tablet Take 1 tablet (0.5 mg total) by mouth at bedtime as needed (Nausea or vomiting). 30 tablet 0  . polyethylene glycol (MIRALAX) 0.34 gm/ml SOLN     . Probiotic Product (PROBIOTIC DAILY PO) Take 1 tablet by mouth.    . prochlorperazine (COMPAZINE) 10 MG tablet Take 1 tablet (10 mg total) by mouth every 6 (six) hours as needed (Nausea or vomiting). 30 tablet 1   No current facility-administered medications for this visit.    Facility-Administered Medications Ordered in Other Visits  Medication Dose Route Frequency Provider Last Rate Last Dose  . cyclophosphamide (CYTOXAN) 1,040 mg in sodium chloride 0.9 % 250 mL chemo infusion  600 mg/m2 (Treatment Plan Recorded) Intravenous Once Magrinat, Virgie Dad, MD 403 mL/hr at 07/01/18 1241 1,040 mg at 07/01/18 1241  . heparin lock flush 100 unit/mL  500 Units Intracatheter Once PRN Magrinat, Virgie Dad, MD      . sodium chloride flush (NS) 0.9 %  injection 10 mL  10 mL Intracatheter PRN Magrinat, Virgie Dad, MD        OBJECTIVE:  Vitals:   07/01/18 0944  BP: 107/75  Pulse: 82  Resp: 20  Temp: 98.5 F (36.9 C)  SpO2: 100%     Body mass index is 24.2 kg/m.   Wt Readings from Last 3 Encounters:  07/01/18 142 lb 1.6 oz (64.5 kg)  06/24/18 142 lb 6.4 oz (64.6 kg)  06/17/18 142 lb 11.2 oz (64.7 kg)  ECOG FS:1 GENERAL: Patient is a well appearing female in no acute distress HEENT:  Sclerae anicteric.  Oropharynx clear and moist. No ulcerations or evidence of oropharyngeal candidiasis. Neck is supple.  NODES:  No cervical, supraclavicular, or axillary lymphadenopathy palpated. BREAST EXAM:  Unable to palpate mass LUNGS:  Clear to auscultation bilaterally.  No wheezes or rhonchi. HEART:  Regular rate and rhythm. No murmur appreciated. ABDOMEN:  Soft, nontender.  Positive, normoactive bowel sounds. No organomegaly palpated. MSK:  No focal spinal tenderness to palpation. Full range of motion bilaterally in the upper extremities. EXTREMITIES:  No peripheral edema.   SKIN:  Clear with no obvious rashes or skin changes. No nail dyscrasia.  Very faint erythema around port site about size of a quarter. NEURO:  Nonfocal. Well oriented.  Appropriate affect.       LAB RESULTS:  CMP     Component Value Date/Time   NA 139 07/01/2018 0921   K 4.0 07/01/2018 0921   CL 106 07/01/2018 0921   CO2 27 07/01/2018 0921   GLUCOSE 96 07/01/2018 0921   BUN 14 07/01/2018 0921   CREATININE 0.72 07/01/2018 0921   CREATININE 0.76 05/15/2018 0827   CREATININE 0.78 01/15/2018 1033   CALCIUM 8.9 07/01/2018 0921   PROT 6.5 07/01/2018 0921   ALBUMIN 4.2 07/01/2018 0921   AST 9 (L) 07/01/2018 0921   AST 10 (L) 05/15/2018 0827   ALT 8 07/01/2018 0921   ALT 9 05/15/2018 0827   ALKPHOS 70 07/01/2018 0921   BILITOT 0.4 07/01/2018 0921   BILITOT 1.1 05/15/2018 0827   GFRNONAA >60 07/01/2018 0921   GFRNONAA >60 05/15/2018 0827   GFRNONAA 95  01/15/2018 1033   GFRAA >60 07/01/2018 0921   GFRAA >60 05/15/2018 0827   GFRAA 110 01/15/2018 1033    No results found  for: TOTALPROTELP, ALBUMINELP, A1GS, A2GS, BETS, BETA2SER, GAMS, MSPIKE, SPEI  No results found for: KPAFRELGTCHN, LAMBDASER, KAPLAMBRATIO  Lab Results  Component Value Date   WBC 7.7 07/01/2018   NEUTROABS 5.1 07/01/2018   HGB 9.8 (L) 07/01/2018   HCT 29.0 (L) 07/01/2018   MCV 91.8 07/01/2018   PLT 182 07/01/2018    _0 @  No results found for: LABCA2  No components found for: HYQMVH846  No results for input(s): INR in the last 168 hours.  No results found for: LABCA2  No results found for: NGE952  No results found for: WUX324  No results found for: MWN027  No results found for: CA2729  No components found for: HGQUANT  No results found for: CEA1 / No results found for: CEA1   No results found for: AFPTUMOR  No results found for: CHROMOGRNA  No results found for: PSA1  Appointment on 07/01/2018  Component Date Value Ref Range Status  . Sodium 07/01/2018 139  135 - 145 mmol/L Final  . Potassium 07/01/2018 4.0  3.5 - 5.1 mmol/L Final  . Chloride 07/01/2018 106  98 - 111 mmol/L Final  . CO2 07/01/2018 27  22 - 32 mmol/L Final  . Glucose, Bld 07/01/2018 96  70 - 99 mg/dL Final  . BUN 07/01/2018 14  6 - 20 mg/dL Final  . Creatinine, Ser 07/01/2018 0.72  0.44 - 1.00 mg/dL Final  . Calcium 07/01/2018 8.9  8.9 - 10.3 mg/dL Final  . Total Protein 07/01/2018 6.5  6.5 - 8.1 g/dL Final  . Albumin 07/01/2018 4.2  3.5 - 5.0 g/dL Final  . AST 07/01/2018 9* 15 - 41 U/L Final  . ALT 07/01/2018 8  0 - 44 U/L Final  . Alkaline Phosphatase 07/01/2018 70  38 - 126 U/L Final  . Total Bilirubin 07/01/2018 0.4  0.3 - 1.2 mg/dL Final  . GFR calc non Af Amer 07/01/2018 >60  >60 mL/min Final  . GFR calc Af Amer 07/01/2018 >60  >60 mL/min Final  . Anion gap 07/01/2018 6  5 - 15 Final   Performed at [facility], [place]  [address], [place], [place] [address]  . WBC 07/01/2018 7.7  4.0 - 10.5 K/uL Final  . RBC 07/01/2018 3.16* 3.87 - 5.11 MIL/uL Final  . Hemoglobin 07/01/2018 9.8* 12.0 - 15.0 g/dL Final  . HCT 07/01/2018 29.0* 36.0 - 46.0 % Final  . MCV 07/01/2018 91.8  80.0 - 100.0 fL Final  . MCH 07/01/2018 31.0  26.0 - 34.0 pg Final  . MCHC 07/01/2018 33.8  30.0 - 36.0 g/dL Final  . RDW 07/01/2018 13.9  11.5 - 15.5 % Final  . Platelets 07/01/2018 182  150 - 400 K/uL Final  . nRBC 07/01/2018 0.0  0.0 - 0.2 % Final  . Neutrophils Relative % 07/01/2018 67  % Final  . Neutro Abs 07/01/2018 5.1  1.7 - 7.7 K/uL Final  . Lymphocytes Relative 07/01/2018 14  % Final  . Lymphs Abs 07/01/2018 1.1  0.7 - 4.0 K/uL Final  . Monocytes Relative 07/01/2018 8  % Final  . Monocytes Absolute 07/01/2018 0.6  0.1 - 1.0 K/uL Final  . Eosinophils Relative 07/01/2018 0  % Final  . Eosinophils Absolute 07/01/2018 0.0  0.0 - 0.5 K/uL Final  . Basophils Relative 07/01/2018 1  % Final  . Basophils Absolute 07/01/2018 0.1  0.0 - 0.1 K/uL Final  . Immature Granulocytes 07/01/2018 10  % Final   Increased IG's, likely  caused by Bone Marrow Colony Stimulating Factor received within 30 days.  . Abs Immature Granulocytes 07/01/2018 0.80* 0.00 - 0.07 K/uL Final  . Polychromasia 07/01/2018 PRESENT   Final   Performed at [facility], [place] [address] [place], [place] [address]    (this displays the last labs from the last 3 days)  No results found for: TOTALPROTELP, ALBUMINELP, A1GS, A2GS, BETS, BETA2SER, GAMS, MSPIKE, SPEI (this displays SPEP labs)  No results found for: KPAFRELGTCHN, LAMBDASER, KAPLAMBRATIO (kappa/lambda light chains)  No results found for: HGBA, HGBA2QUANT, HGBFQUANT, HGBSQUAN (Hemoglobinopathy evaluation)   No results found for: LDH  Lab Results  Component Value Date   IRON 59 12/27/2016   TIBC 329 12/27/2016   IRONPCTSAT 18 12/27/2016   (Iron and TIBC)  Lab Results  Component  Value Date   FERRITIN 24 01/15/2018    Urinalysis    Component Value Date/Time   COLORURINE YELLOW 12/18/2016 [place] 12/18/2016 1607   LABSPEC 1.010 12/18/2016 1607   PHURINE 7.0 12/18/2016 1607   GLUCOSEU NEGATIVE 12/18/2016 1607   HGBUR NEGATIVE 12/18/2016 1607   HGBUR negative 01/13/2010 0940   BILIRUBINUR NEGATIVE 12/18/2016 1607   BILIRUBINUR neg 05/17/2015 [place] 12/18/2016 1607   PROTEINUR NEGATIVE 12/18/2016 1607   UROBILINOGEN 0.2 05/17/2015 1034   UROBILINOGEN 0.2 01/13/2010 0940   NITRITE NEGATIVE 12/18/2016 1607   LEUKOCYTESUR NEGATIVE 12/18/2016 1607     STUDIES:  Dg Femur, Min 2 Views Right  Result Date: 06/10/2018 CLINICAL DATA:  Right leg pain and abnormal bone scan EXAM: RIGHT FEMUR 2 VIEWS COMPARISON:  None. FINDINGS: No acute fracture or dislocation is noted. No gross soft tissue abnormality is noted. No definitive lytic or sclerotic lesion is identified to correspond with that seen on recent bone scan. IMPRESSION: No acute abnormality noted. No focal abnormality to correspond with findings of recent bone scan. Electronically Signed   By: Inez Catalina M.D.   On: 06/10/2018 21:40    ELIGIBLE FOR AVAILABLE RESEARCH PROTOCOL: Upbeat  ASSESSMENT: 43 y.o. [facility] woman, status post right breast upper outer quadrant and right axillary lymph node biopsy 05/03/2018 for a clinical T2 N2, stage IIIC invasive ductal carcinoma, triple negative, with an MIB-1 of 75%  (a) staging studies with CT scans of the chest abdomen and pelvis on 05/29/18 show no distant metastases; bone scan shows questionable focus of tracer accumulation on the proximal to mid right femoral diaphysis  (b) MRI breast on 05/31/18 shows additional non mass like enhancement throughout upper outer right breast that extends 5cm inferomedially from biopsy proven malignancy.    (1) genetics testing 06/06/2018--at [facility]--negative 11 gene  panel   (2) neoadjuvant chemotherapy consisting of doxorubicin and cyclophosphamide in dose dense fashion x4 started 06/03/2018, to be  followed by paclitaxel and carboplatin weekly x12 (a) Echo on 05/23/2018 shows an EF of 60-65%.  (3) definitive surgery to follow (patient is planning on bilateral mastectomies)  (4) adjuvant radiation to follow surgery  PLAN:  Darlene Grant is doing well today.  Her labs are stable and she will proceed with cycle three of Doxorubicin and Cyclophosphamide.  She is tolerating this well and I am delighted that we can no longer feel a palpable mass.   I am concerned that the emla cream may be irritating her skin and causing the intermittent redness.  This is resolved today.  I recommended she consider using ice instead of the cream with her next port  stick.  She is going to think about this.  She has no signs of infection today and she knows to call if her port develops any redness, swelling, tenderness, or warmth.    I called the radiology reading room and spoke to [place].  She will contact Dr. Joneen Caraway to get another read on the breast MRI.    Darlene Grant will return tomorrow for [employer] and in 1 week for labs and f/u.  She knows to call for any questions or concerns prior to her next appointment with [place].    A total of (30) minutes of face-to-face time was spent with this patient with greater than 50% of that time in counseling and care-coordination.   Wilber Bihari, NP  07/01/18 12:53 PM Medical Oncology and Hematology [facility] [address] [place], [place] [address] Tel. [phone]    Fax. [phone]

## 2018-07-01 NOTE — Patient Instructions (Signed)
[facility] Discharge Instructions for Patients Receiving Chemotherapy  Today you received the following chemotherapy agents :  Adriamycin,  Cytoxan.  To help prevent nausea and vomiting after your treatment, we encourage you to take your nausea medication as prescribed and as instructed by Dr. Jana Hakim.    If you develop nausea and vomiting that is not controlled by your nausea medication, call the clinic.   BELOW ARE SYMPTOMS THAT SHOULD BE REPORTED IMMEDIATELY:  *FEVER GREATER THAN 100.5 F  *CHILLS WITH OR WITHOUT FEVER  NAUSEA AND VOMITING THAT IS NOT CONTROLLED WITH YOUR NAUSEA MEDICATION  *UNUSUAL SHORTNESS OF BREATH  *UNUSUAL BRUISING OR BLEEDING  TENDERNESS IN MOUTH AND THROAT WITH OR WITHOUT PRESENCE OF ULCERS  *URINARY PROBLEMS  *BOWEL PROBLEMS  UNUSUAL RASH Items with * indicate a potential emergency and should be followed up as soon as possible.  Feel free to call the clinic should you have any questions or concerns. The clinic phone number is [phone].  Please show the [employer] at check-in to the Emergency Department and triage nurse.

## 2018-07-02 ENCOUNTER — Inpatient Hospital Stay: Payer: BLUE CROSS/BLUE SHIELD

## 2018-07-02 VITALS — BP 105/67 | HR 82 | Temp 98.5°F | Resp 18

## 2018-07-02 DIAGNOSIS — E860 Dehydration: Secondary | ICD-10-CM | POA: Diagnosis not present

## 2018-07-02 DIAGNOSIS — C50411 Malignant neoplasm of upper-outer quadrant of right female breast: Secondary | ICD-10-CM | POA: Diagnosis not present

## 2018-07-02 DIAGNOSIS — Z5111 Encounter for antineoplastic chemotherapy: Secondary | ICD-10-CM | POA: Diagnosis not present

## 2018-07-02 DIAGNOSIS — R110 Nausea: Secondary | ICD-10-CM | POA: Diagnosis not present

## 2018-07-02 DIAGNOSIS — Z171 Estrogen receptor negative status [ER-]: Secondary | ICD-10-CM | POA: Diagnosis not present

## 2018-07-02 DIAGNOSIS — Z452 Encounter for adjustment and management of vascular access device: Secondary | ICD-10-CM | POA: Diagnosis not present

## 2018-07-02 DIAGNOSIS — Z5189 Encounter for other specified aftercare: Secondary | ICD-10-CM | POA: Diagnosis not present

## 2018-07-02 MED ORDER — PEGFILGRASTIM-CBQV 6 MG/0.6ML ~~LOC~~ SOSY
6.0000 mg | PREFILLED_SYRINGE | Freq: Once | SUBCUTANEOUS | Status: AC
  Administered 2018-07-02: 6 mg via SUBCUTANEOUS

## 2018-07-02 MED ORDER — PEGFILGRASTIM-CBQV 6 MG/0.6ML ~~LOC~~ SOSY
PREFILLED_SYRINGE | SUBCUTANEOUS | Status: AC
  Filled 2018-07-02: qty 0.6

## 2018-07-05 ENCOUNTER — Encounter: Payer: Self-pay | Admitting: Oncology

## 2018-07-08 ENCOUNTER — Other Ambulatory Visit: Payer: Self-pay | Admitting: *Deleted

## 2018-07-08 ENCOUNTER — Inpatient Hospital Stay (HOSPITAL_BASED_OUTPATIENT_CLINIC_OR_DEPARTMENT_OTHER): Payer: BLUE CROSS/BLUE SHIELD | Admitting: Adult Health

## 2018-07-08 ENCOUNTER — Inpatient Hospital Stay: Payer: BLUE CROSS/BLUE SHIELD

## 2018-07-08 ENCOUNTER — Encounter: Payer: Self-pay | Admitting: Adult Health

## 2018-07-08 ENCOUNTER — Encounter: Payer: Self-pay | Admitting: *Deleted

## 2018-07-08 ENCOUNTER — Telehealth: Payer: Self-pay | Admitting: Adult Health

## 2018-07-08 VITALS — BP 116/69 | HR 82 | Temp 98.6°F | Resp 18 | Ht 64.25 in | Wt 143.4 lb

## 2018-07-08 DIAGNOSIS — R110 Nausea: Secondary | ICD-10-CM | POA: Diagnosis not present

## 2018-07-08 DIAGNOSIS — C50411 Malignant neoplasm of upper-outer quadrant of right female breast: Secondary | ICD-10-CM

## 2018-07-08 DIAGNOSIS — E860 Dehydration: Secondary | ICD-10-CM | POA: Diagnosis not present

## 2018-07-08 DIAGNOSIS — Z171 Estrogen receptor negative status [ER-]: Secondary | ICD-10-CM | POA: Diagnosis not present

## 2018-07-08 DIAGNOSIS — Z5111 Encounter for antineoplastic chemotherapy: Secondary | ICD-10-CM | POA: Diagnosis not present

## 2018-07-08 DIAGNOSIS — Z5189 Encounter for other specified aftercare: Secondary | ICD-10-CM | POA: Diagnosis not present

## 2018-07-08 DIAGNOSIS — D508 Other iron deficiency anemias: Secondary | ICD-10-CM

## 2018-07-08 DIAGNOSIS — Z95828 Presence of other vascular implants and grafts: Secondary | ICD-10-CM

## 2018-07-08 DIAGNOSIS — Z452 Encounter for adjustment and management of vascular access device: Secondary | ICD-10-CM | POA: Diagnosis not present

## 2018-07-08 LAB — COMPREHENSIVE METABOLIC PANEL
ALT: 9 U/L (ref 0–44)
AST: 9 U/L — AB (ref 15–41)
Albumin: 3.9 g/dL (ref 3.5–5.0)
Alkaline Phosphatase: 121 U/L (ref 38–126)
Anion gap: 7 (ref 5–15)
BUN: 11 mg/dL (ref 6–20)
CO2: 27 mmol/L (ref 22–32)
CREATININE: 0.64 mg/dL (ref 0.44–1.00)
Calcium: 9.3 mg/dL (ref 8.9–10.3)
Chloride: 105 mmol/L (ref 98–111)
GFR calc Af Amer: >60 mL/min (ref >60)
GFR calc non Af Amer: >60 mL/min (ref >60)
Glucose, Bld: 107 mg/dL — ABNORMAL HIGH (ref 70–99)
Potassium: 4.2 mmol/L (ref 3.5–5.1)
Sodium: 139 mmol/L (ref 135–145)
Total Bilirubin: 1.1 mg/dL (ref 0.3–1.2)
Total Protein: 6.4 g/dL — ABNORMAL LOW (ref 6.5–8.1)

## 2018-07-08 LAB — CBC WITH DIFFERENTIAL/PLATELET
Abs Immature Granulocytes: 0.17 10*3/uL — ABNORMAL HIGH (ref 0.00–0.07)
Basophils Absolute: 0 10*3/uL (ref 0.0–0.1)
Basophils Relative: 0 %
Eosinophils Absolute: 0 10*3/uL (ref 0.0–0.5)
Eosinophils Relative: 0 %
HCT: 26.2 % — ABNORMAL LOW (ref 36.0–46.0)
Hemoglobin: 9.1 g/dL — ABNORMAL LOW (ref 12.0–15.0)
Immature Granulocytes: 6 %
Lymphocytes Relative: 10 %
Lymphs Abs: 0.3 10*3/uL — ABNORMAL LOW (ref 0.7–4.0)
MCH: 31.7 pg (ref 26.0–34.0)
MCHC: 34.7 g/dL (ref 30.0–36.0)
MCV: 91.3 fL (ref 80.0–100.0)
Monocytes Absolute: 0.2 10*3/uL (ref 0.1–1.0)
Monocytes Relative: 5 %
Neutro Abs: 2.2 10*3/uL (ref 1.7–7.7)
Neutrophils Relative %: 79 %
Platelets: 138 10*3/uL — ABNORMAL LOW (ref 150–400)
RBC: 2.87 MIL/uL — ABNORMAL LOW (ref 3.87–5.11)
RDW: 13.9 % (ref 11.5–15.5)
WBC: 2.8 10*3/uL — ABNORMAL LOW (ref 4.0–10.5)
nRBC: 0 % (ref 0.0–0.2)

## 2018-07-08 MED ORDER — SODIUM CHLORIDE 0.9 % IV SOLN
INTRAVENOUS | Status: AC
  Administered 2018-07-08: 11:00:00 via INTRAVENOUS
  Filled 2018-07-08 (×2): qty 250

## 2018-07-08 MED ORDER — HEPARIN SOD (PORK) LOCK FLUSH 100 UNIT/ML IV SOLN
500.0000 [IU] | Freq: Once | INTRAVENOUS | Status: AC | PRN
  Administered 2018-07-08: 500 [IU]
  Filled 2018-07-08: qty 5

## 2018-07-08 MED ORDER — SODIUM CHLORIDE 0.9 % IV SOLN: Freq: Once | INTRAVENOUS | Status: DC

## 2018-07-08 MED ORDER — ALUM & MAG HYDROXIDE-SIMETH 200-200-20 MG/5ML PO SUSP
ORAL | Status: AC
  Filled 2018-07-08: qty 30

## 2018-07-08 MED ORDER — ONDANSETRON HCL 4 MG/2ML IJ SOLN
8.0000 mg | Freq: Once | INTRAMUSCULAR | Status: AC
  Administered 2018-07-08: 8 mg via INTRAVENOUS

## 2018-07-08 MED ORDER — OMEPRAZOLE 40 MG PO CPDR: 40.0000 mg | DELAYED_RELEASE_CAPSULE | Freq: Every day | ORAL | 5 refills | Status: DC

## 2018-07-08 MED ORDER — LIDOCAINE HCL 2 % IJ SOLN
INTRAMUSCULAR | Status: AC
  Filled 2018-07-08: qty 20

## 2018-07-08 MED ORDER — ALUM & MAG HYDROXIDE-SIMETH 200-200-20 MG/5ML PO SUSP
30.0000 mL | Freq: Once | ORAL | Status: AC
  Administered 2018-07-08: 30 mL via ORAL

## 2018-07-08 MED ORDER — ONDANSETRON HCL 4 MG/2ML IJ SOLN
INTRAMUSCULAR | Status: AC
  Filled 2018-07-08: qty 4

## 2018-07-08 MED ORDER — LIDOCAINE VISCOUS HCL 2 % MT SOLN
15.0000 mL | Freq: Once | OROMUCOSAL | Status: AC
  Administered 2018-07-08: 15 mL via ORAL
  Filled 2018-07-08: qty 15

## 2018-07-08 MED ORDER — SODIUM CHLORIDE 0.9% FLUSH
10.0000 mL | INTRAVENOUS | Status: DC | PRN
  Administered 2018-07-08: 10 mL
  Filled 2018-07-08: qty 10

## 2018-07-08 NOTE — Progress Notes (Signed)
[facility]  [phone] [phone]     ID: Darlene Grant DOB: 04/01/1976  MR#: [id number]  PPJ#:[id number]  Patient Care Team: Lavada Mesi as PCP - General (Family Medicine) Rolm Bookbinder, MD as Consulting Physician (General Surgery) Magrinat, Virgie Dad, MD as Consulting Physician (Oncology) Gery Pray, MD as Consulting Physician (Radiation Oncology) Emily Filbert, MD as Consulting Physician (Obstetrics and Gynecology) Lavonna Monarch, MD as Consulting Physician (Dermatology) OTHER MD:   CHIEF COMPLAINT: Triple negative breast cancer  CURRENT TREATMENT: Neoadjuvant chemotherapy   HISTORY OF CURRENT ILLNESS: From the original intake note:  Darlene Grant had routine screening mammography on 04/19/2018 showing a possible abnormality in the right breast. She underwent unilateral right diagnostic mammography with tomography and right breast ultrasonography at [facility] on 05/02/2018 showing: Breast Density Category C: There was an oval circumscribed mass in the posterior aspect of the upper outer right breast.  On exam this measured 3.5 cm, was firm, irregular and centered on 10-11 o'clock 5 cm from the nipple.  By ultrasonography there was a 3.7 x 1.7 x 1.5 cm elongated, irregular, hypoechoic mass in the 10:30 o'clock position of the right breast, 5 cm from the nipple.  Ultrasound of the right axilla showed 7 abnormal right axillary lymph nodes highly suspicious for metastatic nodes.  Accordingly on 05/03/2018 she proceeded to biopsy of the right breast area in question and one suspicious lymph node. The pathology from this procedure showed ([id number]): Right Breast: invasive mammary carcinoma, grade 3, E-cadherin positive (and therefore ductal) lymphovascular invasion present. Lymph node: Invasive mammary carcinoma Prognostic indicators significant for: estrogen receptor, 0% negative and progesterone  receptor, 0% negative. Proliferation marker Ki67 at 75%. HER2 negative (1+).  The patient's subsequent history is as detailed below.  INTERVAL HISTORY: Darlene Grant returns today for follow-up of her triple negative breast cancer.  The patient continues on neoadjuvant chemotherapy, consisting of doxorubicin and cyclophosphamide in dose dense fashion x4. Today is day 8 cycle 3.    REVIEW OF SYSTEMS: Darlene Grant is doing moderately well today. Darlene Grant's main concern is reflux.  She has been taking Pepcid for about 4 weeks for her reflux and it has been helping, however over the weekend, it worsened.  She says that the reflux is worsened with meals and at night.  She hasn't changed diet.  She took 1/2 dose of Miralax after chemo and had small bm.  Wednesday and Thursday Darlene Grant had some fatigue, on Friday the fatigue was better but she had diarrhea resolved with Imodium.  Over the weekend her bowels were normal.    Darlene Grant had wanted Dr. Joneen Caraway to look at her MRI.  He called her last Tuesday and reviewed everything with her.    PAST MEDICAL HISTORY: Past Medical History:  Diagnosis Date  . Abnormal Pap smear    ASCUS 03/13/11  . Anemia   . Breast pain, left   . Cancer ([place])   . Candida vaginitis   . Complication of anesthesia    pt states that all narcotics make her angry and she would prefer to avoid them.  . Rectal itching   . Vaginal Pap smear, abnormal     PAST SURGICAL HISTORY: Past Surgical History:  Procedure Laterality Date  . BREAST BIOPSY Right 8/16  . CESAREAN SECTION N/A 11/21/2013   Procedure: CESAREAN SECTION;  Surgeon: Alwyn Pea, MD;  Location: [facility];  Service: Obstetrics;  Laterality: N/A;  . CESAREAN SECTION N/A 06/23/2016  Procedure: CESAREAN SECTION;  Surgeon: Emily Filbert, MD;  Location: [facility];  Service: Obstetrics;  Laterality: N/A;  . HAND SURGERY  04/2012   ligament repair  . PILONIDAL CYST EXCISION  2000  . PORTACATH PLACEMENT N/A 05/28/2018    Procedure: INSERTION PORT-A-CATH WITH ULTRASOUND;  Surgeon: Rolm Bookbinder, MD;  Location: [facility];  Service: General;  Laterality: N/A;  . WISDOM TOOTH EXTRACTION  AGE 29    FAMILY HISTORY Family History  Problem Relation Age of Onset  . Cancer Mother 23       breast, premenopausal  . Breast cancer Mother   . Cancer Maternal Grandmother        skin, stomach  . Cancer Maternal Grandfather        lung    As of November 2019 her father is 57 years old with a history of Parkinson's. Patients' mother is 54 years old with a history of breast cancer, diagnosed age 28, treated with radical mastectomy. The patient has no brothers and two sisters. Besides the patient's mother, the patient has no other family medical history of breast or ovarian cancer. Her maternal grandmother had stomach cancer and her maternal grandfather had lung cancer, --he was a smoker.    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  Her husband, [name], an Chief Financial Officer. She has two children, [name], age 76, and [name], age 50 months.  The patient is not a smoker. She does drink alcohol and drinks an estimated one to two drinks per week.        ADVANCED DIRECTIVES: [name], her husband, is her healthcare department  HEALTH MAINTENANCE: Social History   Tobacco Use  . Smoking status: Never Smoker  . Smokeless tobacco: Never Used  Substance Use Topics  . Alcohol use: Yes    Alcohol/week: 0.0 standard drinks    Comment: occassionally  . Drug use: No     Colonoscopy: no  PAP: 12/2017  Bone density: no   Allergies  Allergen Reactions  . Augmentin [Amoxicillin-Pot Clavulanate]     diarrhea  . Hydrocodone Itching  . Keflex  [Cephalexin] Diarrhea    Current Outpatient Medications  Medication Sig Dispense Refill  . Black Elderberry 50 MG/5ML SYRP Take by mouth.    . ciprofloxacin (CIPRO) 500 MG tablet Take one tablet by mouth 2 times daily for 5 days after each chemo cycle, starting on day 8 of chemo 40 tablet 1  . cyclobenzaprine (FLEXERIL) 10 MG tablet Take 0.5-1 tablets (5-10 mg total) by mouth 3 (three) times daily as needed for muscle spasms. 30 tablet 1  . dexamethasone (DECADRON) 4 MG tablet Take 2 tablets by mouth once a day on the day after chemotherapy and then take 2 tablets two times a day for 2 days. Take with food. 30 tablet 1  . famotidine (PEPCID AC) 10 MG tablet     . lidocaine-prilocaine (EMLA) cream Apply to affected area once 30 g 3  . loratadine (CLARITIN) 10 MG tablet Take 1 tablet (10 mg total) by mouth daily. 90 tablet 0  . LORazepam (ATIVAN) 0.5 MG  tablet Take 1 tablet (0.5 mg total) by mouth at bedtime as needed (Nausea or vomiting). 30 tablet 0  . polyethylene glycol (MIRALAX) 0.34 gm/ml SOLN     . Probiotic Product (PROBIOTIC DAILY PO) Take 1 tablet by mouth.    . prochlorperazine (COMPAZINE) 10 MG tablet Take 1 tablet (10 mg total) by mouth every 6 (six) hours as needed (Nausea or vomiting). 30 tablet 1  . omeprazole (PRILOSEC) 40 MG capsule Take 1 capsule (40 mg total) by mouth daily. 30 capsule 5   Current Facility-Administered Medications  Medication Dose Route Frequency Provider Last Rate Last Dose  . 0.9 %  sodium chloride infusion   Intravenous Continuous Gardenia Phlegm, NP 500 mL/hr at 07/08/18 1030    . alum & mag hydroxide-simeth (MAALOX/MYLANTA) 200-200-20 MG/5ML suspension 30 mL  30 mL Oral Once Causey, Charlestine Massed, NP       And  . lidocaine (XYLOCAINE) 2 % viscous mouth solution 15 mL  15 mL Oral Once Causey, Charlestine Massed, NP      . ondansetron [facility]) injection 8 mg  8 mg Intravenous Once Gardenia Phlegm, NP        OBJECTIVE:  Vitals:    07/08/18 0918  BP: 116/69  Pulse: 82  Resp: 18  Temp: 98.6 F (37 C)  SpO2: 100%     Body mass index is 24.42 kg/m.   Wt Readings from Last 3 Encounters:  07/08/18 143 lb 6.4 oz (65 kg)  07/01/18 142 lb 1.6 oz (64.5 kg)  06/24/18 142 lb 6.4 oz (64.6 kg)  ECOG FS:1 GENERAL: Patient is a tired appearing female in no acute distress HEENT:  Sclerae anicteric.  Oropharynx clear and moist. No ulcerations or evidence of oropharyngeal candidiasis. Neck is supple.  NODES:  No cervical, supraclavicular, or axillary lymphadenopathy palpated. BREAST EXAM:  deferred LUNGS:  Clear to auscultation bilaterally.  No wheezes or rhonchi. HEART:  Regular rate and rhythm. No murmur appreciated. ABDOMEN:  Soft, nontender.  Positive, normoactive bowel sounds. No organomegaly palpated. MSK:  No focal spinal tenderness to palpation.  EXTREMITIES:  No peripheral edema.   SKIN:  Clear with no obvious rashes or skin changes. No nail dyscrasia.   NEURO:  Nonfocal. Well oriented.  Appropriate affect.       LAB RESULTS:  CMP     Component Value Date/Time   NA 139 07/08/2018 0903   K 4.2 07/08/2018 0903   CL 105 07/08/2018 0903   CO2 27 07/08/2018 0903   GLUCOSE 107 (H) 07/08/2018 0903   BUN 11 07/08/2018 0903   CREATININE 0.64 07/08/2018 0903   CREATININE 0.76 05/15/2018 0827   CREATININE 0.78 01/15/2018 1033   CALCIUM 9.3 07/08/2018 0903   PROT 6.4 (L) 07/08/2018 0903   ALBUMIN 3.9 07/08/2018 0903   AST 9 (L) 07/08/2018 0903   AST 10 (L) 05/15/2018 0827   ALT 9 07/08/2018 0903   ALT 9 05/15/2018 0827   ALKPHOS 121 07/08/2018 0903   BILITOT 1.1 07/08/2018 0903   BILITOT 1.1 05/15/2018 0827   GFRNONAA >60 07/08/2018 0903   GFRNONAA >60 05/15/2018 0827   GFRNONAA 95 01/15/2018 1033   GFRAA >60 07/08/2018 0903   GFRAA >60 05/15/2018 0827   GFRAA 110 01/15/2018 1033    No results found for: TOTALPROTELP, ALBUMINELP, A1GS, A2GS, BETS, BETA2SER, GAMS, MSPIKE, SPEI  No results found for:  KPAFRELGTCHN, LAMBDASER, KAPLAMBRATIO  Lab Results  Component Value Date   WBC 2.8 (L) 07/08/2018  NEUTROABS 2.2 07/08/2018   HGB 9.1 (L) 07/08/2018   HCT 26.2 (L) 07/08/2018   MCV 91.3 07/08/2018   PLT 138 (L) 07/08/2018    _0 @  No results found for: LABCA2  No components found for: DZHGDJ242  No results for input(s): INR in the last 168 hours.  No results found for: LABCA2  No results found for: AST419  No results found for: QQI297  No results found for: LGX211  No results found for: CA2729  No components found for: HGQUANT  No results found for: CEA1 / No results found for: CEA1   No results found for: AFPTUMOR  No results found for: CHROMOGRNA  No results found for: PSA1  Appointment on 07/08/2018  Component Date Value Ref Range Status  . Sodium 07/08/2018 139  135 - 145 mmol/L Final  . Potassium 07/08/2018 4.2  3.5 - 5.1 mmol/L Final  . Chloride 07/08/2018 105  98 - 111 mmol/L Final  . CO2 07/08/2018 27  22 - 32 mmol/L Final  . Glucose, Bld 07/08/2018 107* 70 - 99 mg/dL Final  . BUN 07/08/2018 11  6 - 20 mg/dL Final  . Creatinine, Ser 07/08/2018 0.64  0.44 - 1.00 mg/dL Final  . Calcium 07/08/2018 9.3  8.9 - 10.3 mg/dL Final  . Total Protein 07/08/2018 6.4* 6.5 - 8.1 g/dL Final  . Albumin 07/08/2018 3.9  3.5 - 5.0 g/dL Final  . AST 07/08/2018 9* 15 - 41 U/L Final  . ALT 07/08/2018 9  0 - 44 U/L Final  . Alkaline Phosphatase 07/08/2018 121  38 - 126 U/L Final  . Total Bilirubin 07/08/2018 1.1  0.3 - 1.2 mg/dL Final  . GFR calc non Af Amer 07/08/2018 >60  >60 mL/min Final  . GFR calc Af Amer 07/08/2018 >60  >60 mL/min Final  . Anion gap 07/08/2018 7  5 - 15 Final   Performed at [facility], [place] [address], [place], [place] [address]  . WBC 07/08/2018 2.8* 4.0 - 10.5 K/uL Final  . RBC 07/08/2018 2.87* 3.87 - 5.11 MIL/uL Final  . Hemoglobin 07/08/2018 9.1* 12.0 - 15.0 g/dL Final  . HCT 07/08/2018 26.2* 36.0 - 46.0 %  Final  . MCV 07/08/2018 91.3  80.0 - 100.0 fL Final  . MCH 07/08/2018 31.7  26.0 - 34.0 pg Final  . MCHC 07/08/2018 34.7  30.0 - 36.0 g/dL Final  . RDW 07/08/2018 13.9  11.5 - 15.5 % Final  . Platelets 07/08/2018 138* 150 - 400 K/uL Final  . nRBC 07/08/2018 0.0  0.0 - 0.2 % Final  . Neutrophils Relative % 07/08/2018 79  % Final  . Neutro Abs 07/08/2018 2.2  1.7 - 7.7 K/uL Final  . Lymphocytes Relative 07/08/2018 10  % Final  . Lymphs Abs 07/08/2018 0.3* 0.7 - 4.0 K/uL Final  . Monocytes Relative 07/08/2018 5  % Final  . Monocytes Absolute 07/08/2018 0.2  0.1 - 1.0 K/uL Final  . Eosinophils Relative 07/08/2018 0  % Final  . Eosinophils Absolute 07/08/2018 0.0  0.0 - 0.5 K/uL Final  . Basophils Relative 07/08/2018 0  % Final  . Basophils Absolute 07/08/2018 0.0  0.0 - 0.1 K/uL Final  . Immature Granulocytes 07/08/2018 6  % Final   Increased IG's, likely caused by Bone Marrow Colony Stimulating Factor received within 30 days.  . Abs Immature Granulocytes 07/08/2018 0.17* 0.00 - 0.07 K/uL Final  . Tear Drop Cells 07/08/2018 PRESENT   Final  . Ovalocytes  07/08/2018 PRESENT   Final   Performed at [facility], [place] [address] [place], [place] [address]    (this displays the last labs from the last 3 days)  No results found for: TOTALPROTELP, ALBUMINELP, A1GS, A2GS, BETS, BETA2SER, GAMS, MSPIKE, SPEI (this displays SPEP labs)  No results found for: KPAFRELGTCHN, LAMBDASER, KAPLAMBRATIO (kappa/lambda light chains)  No results found for: HGBA, HGBA2QUANT, HGBFQUANT, HGBSQUAN (Hemoglobinopathy evaluation)   No results found for: LDH  Lab Results  Component Value Date   IRON 59 12/27/2016   TIBC 329 12/27/2016   IRONPCTSAT 18 12/27/2016   (Iron and TIBC)  Lab Results  Component Value Date   FERRITIN 24 01/15/2018    Urinalysis    Component Value Date/Time   COLORURINE YELLOW 12/18/2016 [place] 12/18/2016 1607   LABSPEC 1.010  12/18/2016 1607   PHURINE 7.0 12/18/2016 1607   GLUCOSEU NEGATIVE 12/18/2016 1607   HGBUR NEGATIVE 12/18/2016 1607   HGBUR negative 01/13/2010 0940   BILIRUBINUR NEGATIVE 12/18/2016 1607   BILIRUBINUR neg 05/17/2015 [place] 12/18/2016 1607   PROTEINUR NEGATIVE 12/18/2016 1607   UROBILINOGEN 0.2 05/17/2015 1034   UROBILINOGEN 0.2 01/13/2010 0940   NITRITE NEGATIVE 12/18/2016 1607   LEUKOCYTESUR NEGATIVE 12/18/2016 1607     STUDIES:  Dg Femur, Min 2 Views Right  Result Date: 06/10/2018 CLINICAL DATA:  Right leg pain and abnormal bone scan EXAM: RIGHT FEMUR 2 VIEWS COMPARISON:  None. FINDINGS: No acute fracture or dislocation is noted. No gross soft tissue abnormality is noted. No definitive lytic or sclerotic lesion is identified to correspond with that seen on recent bone scan. IMPRESSION: No acute abnormality noted. No focal abnormality to correspond with findings of recent bone scan. Electronically Signed   By: Inez Catalina M.D.   On: 06/10/2018 21:40    ELIGIBLE FOR AVAILABLE RESEARCH PROTOCOL: Upbeat  ASSESSMENT: 43 y.o. [facility] woman, status post right breast upper outer quadrant and right axillary lymph node biopsy 05/03/2018 for a clinical T2 N2, stage IIIC invasive ductal carcinoma, triple negative, with an MIB-1 of 75%  (a) staging studies with CT scans of the chest abdomen and pelvis on 05/29/18 show no distant metastases; bone scan shows questionable focus of tracer accumulation on the proximal to mid right femoral diaphysis  (b) MRI breast on 05/31/18 shows additional non mass like enhancement throughout upper outer right breast that extends 5cm inferomedially from biopsy proven malignancy.    (1) genetics testing 06/06/2018--at [facility]--negative 11 gene panel   (2) neoadjuvant chemotherapy consisting of doxorubicin and cyclophosphamide in dose dense fashion x4 started 06/03/2018, to be  followed by paclitaxel and carboplatin weekly  x12 (a) Echo on 05/23/2018 shows an EF of 60-65%.  (3) definitive surgery to follow (patient is planning on bilateral mastectomies)  (4) adjuvant radiation to follow surgery  PLAN:  Darlene Grant is not feeling well today.  She notes feeling dehydrated and nauseated, likely due to her increase in reflux.  She will receive IV fluids today, IV Zofran, and a GI cocktail.  I called in Omeprazole for her to take first thing in the morning on an empty stomach.  If she is still having GI issues in a few days, we can always add Carafate.    Darlene Grant's labs are stable.  Her hemoglobin is down to 9.1.  Darlene Grant notes that she has a h/o iron deficiency and notes she saw a hematologist about this a few years ago.  Iron rich foods are not palatable at this point.  She cannot tolerate oral iron.  We do not have any recent iron studies.  I will add on a ferritin and iron studies to her labs for next week.  She notes she would much rather receive IV iron if indicated, than a blood transfusion.    Darlene Grant will return in one week for labs, f/u, and her final Doxorubicin and cyclophosphamide.  She knows to call for any questions or concerns prior to her next appointment with [place].    A total of (30) minutes of face-to-face time was spent with this patient with greater than 50% of that time in counseling and care-coordination.   Wilber Bihari, NP  07/08/18 10:33 AM Medical Oncology and Hematology [facility] [address] [place], [place] [address] Tel. [phone]    Fax. [phone]

## 2018-07-08 NOTE — Telephone Encounter (Signed)
Per 1/20 no los

## 2018-07-09 ENCOUNTER — Telehealth: Payer: Self-pay | Admitting: *Deleted

## 2018-07-09 NOTE — Telephone Encounter (Signed)
Plastic surgery consult referral faxed to Dr. Iran Planas at [phone]. Office will call pt with appt date and time

## 2018-07-15 ENCOUNTER — Other Ambulatory Visit: Payer: Self-pay | Admitting: Adult Health

## 2018-07-15 ENCOUNTER — Inpatient Hospital Stay: Payer: BLUE CROSS/BLUE SHIELD

## 2018-07-15 ENCOUNTER — Inpatient Hospital Stay (HOSPITAL_BASED_OUTPATIENT_CLINIC_OR_DEPARTMENT_OTHER): Payer: BLUE CROSS/BLUE SHIELD | Admitting: Adult Health

## 2018-07-15 ENCOUNTER — Ambulatory Visit: Payer: BLUE CROSS/BLUE SHIELD

## 2018-07-15 ENCOUNTER — Encounter: Payer: Self-pay | Admitting: Adult Health

## 2018-07-15 VITALS — BP 144/83 | HR 82 | Temp 99.0°F | Resp 18 | Ht 64.25 in | Wt 144.0 lb

## 2018-07-15 DIAGNOSIS — Z171 Estrogen receptor negative status [ER-]: Secondary | ICD-10-CM

## 2018-07-15 DIAGNOSIS — R110 Nausea: Secondary | ICD-10-CM | POA: Diagnosis not present

## 2018-07-15 DIAGNOSIS — E860 Dehydration: Secondary | ICD-10-CM | POA: Diagnosis not present

## 2018-07-15 DIAGNOSIS — Z5111 Encounter for antineoplastic chemotherapy: Secondary | ICD-10-CM | POA: Diagnosis not present

## 2018-07-15 DIAGNOSIS — Z5189 Encounter for other specified aftercare: Secondary | ICD-10-CM | POA: Diagnosis not present

## 2018-07-15 DIAGNOSIS — C50411 Malignant neoplasm of upper-outer quadrant of right female breast: Secondary | ICD-10-CM

## 2018-07-15 DIAGNOSIS — Z452 Encounter for adjustment and management of vascular access device: Secondary | ICD-10-CM | POA: Diagnosis not present

## 2018-07-15 DIAGNOSIS — D508 Other iron deficiency anemias: Secondary | ICD-10-CM

## 2018-07-15 LAB — COMPREHENSIVE METABOLIC PANEL
ALBUMIN: 4.1 g/dL (ref 3.5–5.0)
ALT: 9 U/L (ref 0–44)
AST: 10 U/L — ABNORMAL LOW (ref 15–41)
Alkaline Phosphatase: 82 U/L (ref 38–126)
Anion gap: 7 (ref 5–15)
BUN: 8 mg/dL (ref 6–20)
CHLORIDE: 106 mmol/L (ref 98–111)
CO2: 27 mmol/L (ref 22–32)
Calcium: 8.6 mg/dL — ABNORMAL LOW (ref 8.9–10.3)
Creatinine, Ser: 0.68 mg/dL (ref 0.44–1.00)
GFR calc Af Amer: >60 mL/min (ref >60)
GFR calc non Af Amer: >60 mL/min (ref >60)
Glucose, Bld: 92 mg/dL (ref 70–99)
Potassium: 3.8 mmol/L (ref 3.5–5.1)
Sodium: 140 mmol/L (ref 135–145)
Total Bilirubin: 0.4 mg/dL (ref 0.3–1.2)
Total Protein: 6.5 g/dL (ref 6.5–8.1)

## 2018-07-15 LAB — CBC WITH DIFFERENTIAL/PLATELET
ABS IMMATURE GRANULOCYTES: 0.83 10*3/uL — AB (ref 0.00–0.07)
Basophils Absolute: 0.1 10*3/uL (ref 0.0–0.1)
Basophils Relative: 1 %
Eosinophils Absolute: 0 10*3/uL (ref 0.0–0.5)
Eosinophils Relative: 0 %
HCT: 26.6 % — ABNORMAL LOW (ref 36.0–46.0)
Hemoglobin: 9 g/dL — ABNORMAL LOW (ref 12.0–15.0)
Immature Granulocytes: 12 %
LYMPHS ABS: 0.7 10*3/uL (ref 0.7–4.0)
LYMPHS PCT: 10 %
MCH: 31.9 pg (ref 26.0–34.0)
MCHC: 33.8 g/dL (ref 30.0–36.0)
MCV: 94.3 fL (ref 80.0–100.0)
MONOS PCT: 9 %
Monocytes Absolute: 0.6 10*3/uL (ref 0.1–1.0)
Neutro Abs: 4.5 10*3/uL (ref 1.7–7.7)
Neutrophils Relative %: 68 %
Platelets: 163 10*3/uL (ref 150–400)
RBC: 2.82 MIL/uL — ABNORMAL LOW (ref 3.87–5.11)
RDW: 16.4 % — ABNORMAL HIGH (ref 11.5–15.5)
WBC: 6.7 10*3/uL (ref 4.0–10.5)
nRBC: 0 % (ref 0.0–0.2)

## 2018-07-15 LAB — FERRITIN: Ferritin: 92 ng/mL (ref 11–307)

## 2018-07-15 LAB — IRON AND TIBC
Iron: 64 ug/dL (ref 41–142)
Saturation Ratios: 24 % (ref 21–57)
TIBC: 265 ug/dL (ref 236–444)
UIBC: 201 ug/dL (ref 120–384)

## 2018-07-15 MED ORDER — SODIUM CHLORIDE 0.9 % IV SOLN
INTRAVENOUS | Status: AC
  Administered 2018-07-15: 11:00:00 via INTRAVENOUS
  Filled 2018-07-15: qty 250

## 2018-07-15 MED ORDER — SODIUM CHLORIDE 0.9 % IV SOLN
600.0000 mg/m2 | Freq: Once | INTRAVENOUS | Status: AC
  Administered 2018-07-15: 1040 mg via INTRAVENOUS
  Filled 2018-07-15: qty 52

## 2018-07-15 MED ORDER — SODIUM CHLORIDE 0.9 % IV SOLN
Freq: Once | INTRAVENOUS | Status: AC
  Administered 2018-07-15: 11:00:00 via INTRAVENOUS
  Filled 2018-07-15: qty 250

## 2018-07-15 MED ORDER — HEPARIN SOD (PORK) LOCK FLUSH 100 UNIT/ML IV SOLN
500.0000 [IU] | Freq: Once | INTRAVENOUS | Status: AC | PRN
  Administered 2018-07-15: 500 [IU]
  Filled 2018-07-15: qty 5

## 2018-07-15 MED ORDER — ACETAMINOPHEN 325 MG PO TABS
650.0000 mg | ORAL_TABLET | Freq: Once | ORAL | Status: AC | PRN
  Administered 2018-07-15: 650 mg via ORAL

## 2018-07-15 MED ORDER — SODIUM CHLORIDE 0.9 % IV SOLN
Freq: Once | INTRAVENOUS | Status: AC
  Administered 2018-07-15: 11:00:00 via INTRAVENOUS
  Filled 2018-07-15: qty 5

## 2018-07-15 MED ORDER — PALONOSETRON HCL INJECTION 0.25 MG/5ML
0.2500 mg | Freq: Once | INTRAVENOUS | Status: AC
  Administered 2018-07-15: 0.25 mg via INTRAVENOUS

## 2018-07-15 MED ORDER — DOXORUBICIN HCL CHEMO IV INJECTION 2 MG/ML
60.0000 mg/m2 | Freq: Once | INTRAVENOUS | Status: AC
  Administered 2018-07-15: 104 mg via INTRAVENOUS
  Filled 2018-07-15: qty 52

## 2018-07-15 MED ORDER — ACETAMINOPHEN 325 MG PO TABS
ORAL_TABLET | ORAL | Status: AC
  Filled 2018-07-15: qty 2

## 2018-07-15 MED ORDER — PALONOSETRON HCL INJECTION 0.25 MG/5ML
INTRAVENOUS | Status: AC
  Filled 2018-07-15: qty 5

## 2018-07-15 MED ORDER — SODIUM CHLORIDE 0.9% FLUSH
10.0000 mL | INTRAVENOUS | Status: DC | PRN
  Administered 2018-07-15: 10 mL
  Filled 2018-07-15: qty 10

## 2018-07-15 NOTE — Patient Instructions (Signed)
[facility] Discharge Instructions for Patients Receiving Chemotherapy  Today you received the following chemotherapy agents :  Adriamycin,  Cytoxan.  To help prevent nausea and vomiting after your treatment, we encourage you to take your nausea medication as prescribed and as instructed by Dr. Jana Hakim.    If you develop nausea and vomiting that is not controlled by your nausea medication, call the clinic.   BELOW ARE SYMPTOMS THAT SHOULD BE REPORTED IMMEDIATELY:  *FEVER GREATER THAN 100.5 F  *CHILLS WITH OR WITHOUT FEVER  NAUSEA AND VOMITING THAT IS NOT CONTROLLED WITH YOUR NAUSEA MEDICATION  *UNUSUAL SHORTNESS OF BREATH  *UNUSUAL BRUISING OR BLEEDING  TENDERNESS IN MOUTH AND THROAT WITH OR WITHOUT PRESENCE OF ULCERS  *URINARY PROBLEMS  *BOWEL PROBLEMS  UNUSUAL RASH Items with * indicate a potential emergency and should be followed up as soon as possible.  Feel free to call the clinic should you have any questions or concerns. The clinic phone number is [phone].  Please show the [place] at check-in to the Emergency Department and triage nurse.

## 2018-07-15 NOTE — Progress Notes (Signed)
[place]  [phone] [phone]     ID: Darlene Grant DOB: July 18, 1975  MR#: [id number]  TMH#:[id number]  Patient Care Team: Lavada Mesi as PCP - General (Family Medicine) Rolm Bookbinder, MD as Consulting Physician (General Surgery) Magrinat, Virgie Dad, MD as Consulting Physician (Oncology) Gery Pray, MD as Consulting Physician (Radiation Oncology) Emily Filbert, MD as Consulting Physician (Obstetrics and Gynecology) Lavonna Monarch, MD as Consulting Physician (Dermatology) OTHER MD:   CHIEF COMPLAINT: Triple negative breast cancer  CURRENT TREATMENT: Neoadjuvant chemotherapy   HISTORY OF CURRENT ILLNESS: From the original intake note:  Darlene Grant had routine screening mammography on 04/19/2018 showing a possible abnormality in the right breast. She underwent unilateral right diagnostic mammography with tomography and right breast ultrasonography at [facility] on 05/02/2018 showing: Breast Density Category C: There was an oval circumscribed mass in the posterior aspect of the upper outer right breast.  On exam this measured 3.5 cm, was firm, irregular and centered on 10-11 o'clock 5 cm from the nipple.  By ultrasonography there was a 3.7 x 1.7 x 1.5 cm elongated, irregular, hypoechoic mass in the 10:30 o'clock position of the right breast, 5 cm from the nipple.  Ultrasound of the right axilla showed 7 abnormal right axillary lymph nodes highly suspicious for metastatic nodes.  Accordingly on 05/03/2018 she proceeded to biopsy of the right breast area in question and one suspicious lymph node. The pathology from this procedure showed ([id number]): Right Breast: invasive mammary carcinoma, grade 3, E-cadherin positive (and therefore ductal) lymphovascular invasion present. Lymph node: Invasive mammary carcinoma Prognostic indicators significant for: estrogen receptor, 0% negative and progesterone  receptor, 0% negative. Proliferation marker Ki67 at 75%. HER2 negative (1+).  The patient's subsequent history is as detailed below.  INTERVAL HISTORY: Darlene Grant returns today for follow-up of her triple negative breast cancer.  The patient continues on neoadjuvant chemotherapy, consisting of doxorubicin and cyclophosphamide in dose dense fashion x4. Today is day 1 cycle 4.    REVIEW OF SYSTEMS: Darlene Grant is doing well today. She was having reflux last week and was started on Omeprazole and given a GI cocktail.  She is improved with taking Omeprazole.  She has increased her fiber intake and is now having normal bowel movements.  She says she has a constant lingering slight nausea.  She is still eating and drinking regularly.  She is feeling better than she had felt last week.    Darlene Grant denies fever or chills.  She is without cough, shortness of breath, chest pain or palpitations.  She hasn't had any vomiting, further constipation or diarrhea.  She does note that she got a headache last week after she got Zofran.    PAST MEDICAL HISTORY: Past Medical History:  Diagnosis Date  . Abnormal Pap smear    ASCUS 03/13/11  . Anemia   . Breast pain, left   . Cancer ([place])   . Candida vaginitis   . Complication of anesthesia    pt states that all narcotics make her angry and she would prefer to avoid them.  . Rectal itching   . Vaginal Pap smear, abnormal     PAST SURGICAL HISTORY: Past Surgical History:  Procedure Laterality Date  . BREAST BIOPSY Right 8/16  . CESAREAN SECTION N/A 11/21/2013   Procedure: CESAREAN SECTION;  Surgeon: Alwyn Pea, MD;  Location: [facility];  Service: Obstetrics;  Laterality: N/A;  . CESAREAN SECTION N/A 06/23/2016   Procedure: CESAREAN  SECTION;  Surgeon: Emily Filbert, MD;  Location: [facility];  Service: Obstetrics;  Laterality: N/A;  . HAND SURGERY  04/2012   ligament repair  . PILONIDAL CYST EXCISION  2000  . PORTACATH PLACEMENT N/A 05/28/2018   Procedure:  INSERTION PORT-A-CATH WITH ULTRASOUND;  Surgeon: Rolm Bookbinder, MD;  Location: [facility];  Service: General;  Laterality: N/A;  . WISDOM TOOTH EXTRACTION  AGE 40    FAMILY HISTORY Family History  Problem Relation Age of Onset  . Cancer Mother 54       breast, premenopausal  . Breast cancer Mother   . Cancer Maternal Grandmother        skin, stomach  . Cancer Maternal Grandfather        lung    As of November 2019 her father is 73 years old with a history of Parkinson's. Patients' mother is 72 years old with a history of breast cancer, diagnosed age 22, treated with radical mastectomy. The patient has no brothers and two sisters. Besides the patient's mother, the patient has no other family medical history of breast or ovarian cancer. Her maternal grandmother had stomach cancer and her maternal grandfather had lung cancer, --he was a smoker.    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  Her husband, Darlene Grant, an Chief Financial Officer. She has two children, [name], age 19, and [name], age 55 months.  The patient is not a smoker. She does drink alcohol and drinks an estimated one to two drinks per week.        ADVANCED DIRECTIVES: Darlene Grant, her husband, is her healthcare department  HEALTH MAINTENANCE: Social History   Tobacco Use  . Smoking status: Never Smoker  . Smokeless tobacco: Never Used  Substance Use Topics  . Alcohol use: Yes    Alcohol/week: 0.0 standard drinks    Comment: occassionally  . Drug use: No     Colonoscopy: no  PAP: 12/2017  Bone density: no   Allergies  Allergen Reactions  . Augmentin [Amoxicillin-Pot Clavulanate]     diarrhea  . Hydrocodone Itching  . Keflex [Cephalexin]  Diarrhea    Current Outpatient Medications  Medication Sig Dispense Refill  . Black Elderberry 50 MG/5ML SYRP Take by mouth.    . ciprofloxacin (CIPRO) 500 MG tablet Take one tablet by mouth 2 times daily for 5 days after each chemo cycle, starting on day 8 of chemo 40 tablet 1  . cyclobenzaprine (FLEXERIL) 10 MG tablet Take 0.5-1 tablets (5-10 mg total) by mouth 3 (three) times daily as needed for muscle spasms. 30 tablet 1  . dexamethasone (DECADRON) 4 MG tablet Take 2 tablets by mouth once a day on the day after chemotherapy and then take 2 tablets two times a day for 2 days. Take with food. 30 tablet 1  . lidocaine-prilocaine (EMLA) cream Apply to affected area once 30 g 3  . loratadine (CLARITIN) 10 MG tablet Take 1 tablet (10 mg total) by mouth daily. 90 tablet 0  . LORazepam (ATIVAN) 0.5 MG tablet Take 1 tablet (0.5 mg total) by mouth at bedtime as needed (  Nausea or vomiting). 30 tablet 0  . omeprazole (PRILOSEC) 40 MG capsule Take 1 capsule (40 mg total) by mouth daily. 30 capsule 5  . polyethylene glycol (MIRALAX) 0.34 gm/ml SOLN     . Probiotic Product (PROBIOTIC DAILY PO) Take 1 tablet by mouth.    . prochlorperazine (COMPAZINE) 10 MG tablet Take 1 tablet (10 mg total) by mouth every 6 (six) hours as needed (Nausea or vomiting). 30 tablet 1   No current facility-administered medications for this visit.    Facility-Administered Medications Ordered in Other Visits  Medication Dose Route Frequency Provider Last Rate Last Dose  . acetaminophen (TYLENOL) tablet 650 mg  650 mg Oral Once PRN Magrinat, Virgie Dad, MD      . cyclophosphamide (CYTOXAN) 1,040 mg in sodium chloride 0.9 % 250 mL chemo infusion  600 mg/m2 (Treatment Plan Recorded) Intravenous Once Magrinat, Virgie Dad, MD      . DOXOrubicin (ADRIAMYCIN) chemo injection 104 mg  60 mg/m2 (Treatment Plan Recorded) Intravenous Once Magrinat, Virgie Dad, MD      . fosaprepitant (EMEND) 150 mg, dexamethasone (DECADRON) 12 mg in sodium  chloride 0.9 % 145 mL IVPB   Intravenous Once Magrinat, Virgie Dad, MD      . heparin lock flush 100 unit/mL  500 Units Intracatheter Once PRN Magrinat, Virgie Dad, MD      . palonosetron (ALOXI) injection 0.25 mg  0.25 mg Intravenous Once Magrinat, Virgie Dad, MD      . sodium chloride flush (NS) 0.9 % injection 10 mL  10 mL Intracatheter PRN Magrinat, Virgie Dad, MD        OBJECTIVE:  Vitals:   07/15/18 0938  BP: (!) 144/83  Pulse: 82  Resp: 18  Temp: 99 F (37.2 C)  SpO2: 100%     Body mass index is 24.53 kg/m.   Wt Readings from Last 3 Encounters:  07/15/18 144 lb (65.3 kg)  07/08/18 143 lb 6.4 oz (65 kg)  07/01/18 142 lb 1.6 oz (64.5 kg)  ECOG FS:1 GENERAL: Patient is a tired appearing female in no acute distress HEENT:  Sclerae anicteric.  Oropharynx clear and moist. No ulcerations or evidence of oropharyngeal candidiasis. Neck is supple.  NODES:  No cervical, supraclavicular, or axillary lymphadenopathy palpated. BREAST EXAM:  deferred LUNGS:  Clear to auscultation bilaterally.  No wheezes or rhonchi. HEART:  Regular rate and rhythm. No murmur appreciated. ABDOMEN:  Soft, nontender.  Positive, normoactive bowel sounds. No organomegaly palpated. MSK:  No focal spinal tenderness to palpation.  EXTREMITIES:  No peripheral edema.   SKIN:  Clear with no obvious rashes or skin changes. No nail dyscrasia.   NEURO:  Nonfocal. Well oriented.  Appropriate affect.       LAB RESULTS:  CMP     Component Value Date/Time   NA 140 07/15/2018 0907   K 3.8 07/15/2018 0907   CL 106 07/15/2018 0907   CO2 27 07/15/2018 0907   GLUCOSE 92 07/15/2018 0907   BUN 8 07/15/2018 0907   CREATININE 0.68 07/15/2018 0907   CREATININE 0.76 05/15/2018 0827   CREATININE 0.78 01/15/2018 1033   CALCIUM 8.6 (L) 07/15/2018 0907   PROT 6.5 07/15/2018 0907   ALBUMIN 4.1 07/15/2018 0907   AST 10 (L) 07/15/2018 0907   AST 10 (L) 05/15/2018 0827   ALT 9 07/15/2018 0907   ALT 9 05/15/2018 0827    ALKPHOS 82 07/15/2018 0907   BILITOT 0.4 07/15/2018 0907   BILITOT 1.1 05/15/2018 0827  GFRNONAA >60 07/15/2018 0907   GFRNONAA >60 05/15/2018 0827   GFRNONAA 95 01/15/2018 1033   GFRAA >60 07/15/2018 0907   GFRAA >60 05/15/2018 0827   GFRAA 110 01/15/2018 1033    No results found for: TOTALPROTELP, ALBUMINELP, A1GS, A2GS, BETS, BETA2SER, GAMS, MSPIKE, SPEI  No results found for: KPAFRELGTCHN, LAMBDASER, KAPLAMBRATIO  Lab Results  Component Value Date   WBC 6.7 07/15/2018   NEUTROABS 4.5 07/15/2018   HGB 9.0 (L) 07/15/2018   HCT 26.6 (L) 07/15/2018   MCV 94.3 07/15/2018   PLT 163 07/15/2018    '@LASTCHEMISTRY'$ @  No results found for: LABCA2  No components found for: MVEHMC947  No results for input(s): INR in the last 168 hours.  No results found for: LABCA2  No results found for: SJG283  No results found for: MOQ947  No results found for: MLY650  No results found for: CA2729  No components found for: HGQUANT  No results found for: CEA1 / No results found for: CEA1   No results found for: AFPTUMOR  No results found for: CHROMOGRNA  No results found for: PSA1  Appointment on 07/15/2018  Component Date Value Ref Range Status  . Sodium 07/15/2018 140  135 - 145 mmol/L Final  . Potassium 07/15/2018 3.8  3.5 - 5.1 mmol/L Final  . Chloride 07/15/2018 106  98 - 111 mmol/L Final  . CO2 07/15/2018 27  22 - 32 mmol/L Final  . Glucose, Bld 07/15/2018 92  70 - 99 mg/dL Final  . BUN 07/15/2018 8  6 - 20 mg/dL Final  . Creatinine, Ser 07/15/2018 0.68  0.44 - 1.00 mg/dL Final  . Calcium 07/15/2018 8.6* 8.9 - 10.3 mg/dL Final  . Total Protein 07/15/2018 6.5  6.5 - 8.1 g/dL Final  . Albumin 07/15/2018 4.1  3.5 - 5.0 g/dL Final  . AST 07/15/2018 10* 15 - 41 U/L Final  . ALT 07/15/2018 9  0 - 44 U/L Final  . Alkaline Phosphatase 07/15/2018 82  38 - 126 U/L Final  . Total Bilirubin 07/15/2018 0.4  0.3 - 1.2 mg/dL Final  . GFR calc non Af Amer 07/15/2018 >60  >60 mL/min  Final  . GFR calc Af Amer 07/15/2018 >60  >60 mL/min Final  . Anion gap 07/15/2018 7  5 - 15 Final   Performed at [facility], [place] [address] [place], [place] [address]  . WBC 07/15/2018 6.7  4.0 - 10.5 K/uL Final  . RBC 07/15/2018 2.82* 3.87 - 5.11 MIL/uL Final  . Hemoglobin 07/15/2018 9.0* 12.0 - 15.0 g/dL Final  . HCT 07/15/2018 26.6* 36.0 - 46.0 % Final  . MCV 07/15/2018 94.3  80.0 - 100.0 fL Final  . MCH 07/15/2018 31.9  26.0 - 34.0 pg Final  . MCHC 07/15/2018 33.8  30.0 - 36.0 g/dL Final  . RDW 07/15/2018 16.4* 11.5 - 15.5 % Final  . Platelets 07/15/2018 163  150 - 400 K/uL Final  . nRBC 07/15/2018 0.0  0.0 - 0.2 % Final  . Neutrophils Relative % 07/15/2018 68  % Final  . Neutro Abs 07/15/2018 4.5  1.7 - 7.7 K/uL Final  . Lymphocytes Relative 07/15/2018 10  % Final  . Lymphs Abs 07/15/2018 0.7  0.7 - 4.0 K/uL Final  . Monocytes Relative 07/15/2018 9  % Final  . Monocytes Absolute 07/15/2018 0.6  0.1 - 1.0 K/uL Final  . Eosinophils Relative 07/15/2018 0  % Final  . Eosinophils Absolute 07/15/2018 0.0  0.0 -  0.5 K/uL Final  . Basophils Relative 07/15/2018 1  % Final  . Basophils Absolute 07/15/2018 0.1  0.0 - 0.1 K/uL Final  . Immature Granulocytes 07/15/2018 12  % Final   Increased IG's, likely caused by Bone Marrow Colony Stimulating Factor received within 30 days.  . Abs Immature Granulocytes 07/15/2018 0.83* 0.00 - 0.07 K/uL Final  . Tear Drop Cells 07/15/2018 PRESENT   Final  . Polychromasia 07/15/2018 PRESENT   Final  . Ovalocytes 07/15/2018 PRESENT   Final   Performed at [facility], [place] [address] [place], [place] [address]    (this displays the last labs from the last 3 days)  No results found for: TOTALPROTELP, ALBUMINELP, A1GS, A2GS, BETS, BETA2SER, GAMS, MSPIKE, SPEI (this displays SPEP labs)  No results found for: KPAFRELGTCHN, LAMBDASER, KAPLAMBRATIO (kappa/lambda light chains)  No results found for: HGBA,  HGBA2QUANT, HGBFQUANT, HGBSQUAN (Hemoglobinopathy evaluation)   No results found for: LDH  Lab Results  Component Value Date   IRON 59 12/27/2016   TIBC 329 12/27/2016   IRONPCTSAT 18 12/27/2016   (Iron and TIBC)  Lab Results  Component Value Date   FERRITIN 24 01/15/2018    Urinalysis    Component Value Date/Time   COLORURINE YELLOW 12/18/2016 [place] 12/18/2016 1607   LABSPEC 1.010 12/18/2016 1607   PHURINE 7.0 12/18/2016 1607   GLUCOSEU NEGATIVE 12/18/2016 1607   HGBUR NEGATIVE 12/18/2016 1607   HGBUR negative 01/13/2010 0940   BILIRUBINUR NEGATIVE 12/18/2016 1607   BILIRUBINUR neg 05/17/2015 [place] 12/18/2016 1607   PROTEINUR NEGATIVE 12/18/2016 1607   UROBILINOGEN 0.2 05/17/2015 1034   UROBILINOGEN 0.2 01/13/2010 0940   NITRITE NEGATIVE 12/18/2016 1607   LEUKOCYTESUR NEGATIVE 12/18/2016 1607     STUDIES:  No results found.  ELIGIBLE FOR AVAILABLE RESEARCH PROTOCOL: Upbeat  ASSESSMENT: 43 y.o. [place] woman, status post right breast upper outer quadrant and right axillary lymph node biopsy 05/03/2018 for a clinical T2 N2, stage IIIC invasive ductal carcinoma, triple negative, with an MIB-1 of 75%  (a) staging studies with CT scans of the chest abdomen and pelvis on 05/29/18 show no distant metastases; bone scan shows questionable focus of tracer accumulation on the proximal to mid right femoral diaphysis  (b) MRI breast on 05/31/18 shows additional non mass like enhancement throughout upper outer right breast that extends 5cm inferomedially from biopsy proven malignancy.    (1) genetics testing 06/06/2018--at [facility]--negative 11 gene panel   (2) neoadjuvant chemotherapy consisting of doxorubicin and cyclophosphamide in dose dense fashion x4 started 06/03/2018, to be  followed by paclitaxel and carboplatin weekly x12 (a) Echo on 05/23/2018 shows an EF of 60-65%.  (3) definitive surgery to follow (patient  is planning on bilateral mastectomies)  (4) adjuvant radiation to follow surgery  PLAN:  Darlene Grant is doing well today.  Her labs are stable today.  She will proceed with chemo today with her fourth cycle of Doxorubicin and Cyclophosphamide.  She is happy that today is her final cycle of this treatment.    Since Darlene Grant had a difficult time with her third cycle of chemotherapy, I have sent a scheduling request for her to return on Saturday, 07/20/2018 for one liter of normal saline and IV compazine if needed.  We will avoid giving her Zofran IV since it gave her a headache.    Darlene Grant's reflux is improved with Omeprazole daily.  She will continue this.    Due to  her anemia, and history of iron deficiency, she had iron studies drawn today with her labs.  Those should result later on today.  Darlene Grant will return tomorrow afternoon for Udenyca, on Saturday for IV fluids, and in one week for labs, f/u with Dr. Jana Hakim.  She knows to call for any questions or concerns prior to her next appointment with [place].    A total of (30) minutes of face-to-face time was spent with this patient with greater than 50% of that time in counseling and care-coordination.   Wilber Bihari, NP  07/15/18 10:56 AM Medical Oncology and Hematology [facility] [address] [place], [place] [address] Tel. [phone]    Fax. [phone]

## 2018-07-16 ENCOUNTER — Telehealth: Payer: Self-pay | Admitting: Adult Health

## 2018-07-16 ENCOUNTER — Inpatient Hospital Stay: Payer: BLUE CROSS/BLUE SHIELD

## 2018-07-16 VITALS — BP 108/67 | HR 76 | Temp 97.9°F | Resp 18

## 2018-07-16 DIAGNOSIS — Z5189 Encounter for other specified aftercare: Secondary | ICD-10-CM | POA: Diagnosis not present

## 2018-07-16 DIAGNOSIS — Z5111 Encounter for antineoplastic chemotherapy: Secondary | ICD-10-CM | POA: Diagnosis not present

## 2018-07-16 DIAGNOSIS — Z452 Encounter for adjustment and management of vascular access device: Secondary | ICD-10-CM | POA: Diagnosis not present

## 2018-07-16 DIAGNOSIS — C50411 Malignant neoplasm of upper-outer quadrant of right female breast: Secondary | ICD-10-CM

## 2018-07-16 DIAGNOSIS — E860 Dehydration: Secondary | ICD-10-CM | POA: Diagnosis not present

## 2018-07-16 DIAGNOSIS — Z171 Estrogen receptor negative status [ER-]: Secondary | ICD-10-CM | POA: Diagnosis not present

## 2018-07-16 DIAGNOSIS — R110 Nausea: Secondary | ICD-10-CM | POA: Diagnosis not present

## 2018-07-16 MED ORDER — PEGFILGRASTIM-CBQV 6 MG/0.6ML ~~LOC~~ SOSY
PREFILLED_SYRINGE | SUBCUTANEOUS | Status: AC
  Filled 2018-07-16: qty 0.6

## 2018-07-16 MED ORDER — PEGFILGRASTIM-CBQV 6 MG/0.6ML ~~LOC~~ SOSY
6.0000 mg | PREFILLED_SYRINGE | Freq: Once | SUBCUTANEOUS | Status: AC
  Administered 2018-07-16: 6 mg via SUBCUTANEOUS

## 2018-07-16 NOTE — Telephone Encounter (Signed)
Tried to reach regarding 2/1

## 2018-07-20 ENCOUNTER — Inpatient Hospital Stay: Payer: BLUE CROSS/BLUE SHIELD

## 2018-07-22 ENCOUNTER — Ambulatory Visit: Payer: BLUE CROSS/BLUE SHIELD | Admitting: Oncology

## 2018-07-22 ENCOUNTER — Telehealth: Payer: Self-pay | Admitting: *Deleted

## 2018-07-22 ENCOUNTER — Ambulatory Visit (HOSPITAL_COMMUNITY)
Admission: RE | Admit: 2018-07-22 | Discharge: 2018-07-22 | Disposition: A | Discharge status: Home / Self Care | Payer: BLUE CROSS/BLUE SHIELD | Source: Ambulatory Visit | Attending: Oncology | Admitting: Oncology

## 2018-07-22 ENCOUNTER — Other Ambulatory Visit: Payer: Self-pay | Admitting: Adult Health

## 2018-07-22 ENCOUNTER — Other Ambulatory Visit: Payer: BLUE CROSS/BLUE SHIELD

## 2018-07-22 ENCOUNTER — Telehealth: Payer: Self-pay

## 2018-07-22 DIAGNOSIS — C50411 Malignant neoplasm of upper-outer quadrant of right female breast: Secondary | ICD-10-CM | POA: Insufficient documentation

## 2018-07-22 DIAGNOSIS — Z171 Estrogen receptor negative status [ER-]: Secondary | ICD-10-CM | POA: Diagnosis not present

## 2018-07-22 MED ORDER — GI COCKTAIL ~~LOC~~: 30.0000 mL | Freq: Every day | ORAL | 0 refills | Status: DC | PRN

## 2018-07-22 MED ORDER — SUCRALFATE 1 G PO TABS: 1.0000 g | ORAL_TABLET | Freq: Three times a day (TID) | ORAL | 0 refills | Status: DC

## 2018-07-22 NOTE — Progress Notes (Signed)
[place]  [phone] [phone]     ID: Darlene Grant DOB: 1975/12/04  MR#: [id number]  JYN#:[id number]  Patient Care Team: Vandella Ord, Virgie Dad, MD as PCP - General (Oncology) Rolm Bookbinder, MD as Consulting Physician (General Surgery) Mahdi Frye, Virgie Dad, MD as Consulting Physician (Oncology) Gery Pray, MD as Consulting Physician (Radiation Oncology) Emily Filbert, MD as Consulting Physician (Obstetrics and Gynecology) Lavonna Monarch, MD as Consulting Physician (Dermatology) OTHER MD:   CHIEF COMPLAINT: Triple negative breast cancer  CURRENT TREATMENT: Neoadjuvant chemotherapy   HISTORY OF CURRENT ILLNESS: From the original intake note:  Darlene Grant had routine screening mammography on 04/19/2018 showing a possible abnormality in the right breast. She underwent unilateral right diagnostic mammography with tomography and right breast ultrasonography at [facility] on 05/02/2018 showing: Breast Density Category C: There was an oval circumscribed mass in the posterior aspect of the upper outer right breast.  On exam this measured 3.5 cm, was firm, irregular and centered on 10-11 o'clock 5 cm from the nipple.  By ultrasonography there was a 3.7 x 1.7 x 1.5 cm elongated, irregular, hypoechoic mass in the 10:30 o'clock position of the right breast, 5 cm from the nipple.  Ultrasound of the right axilla showed 7 abnormal right axillary lymph nodes highly suspicious for metastatic nodes.  Accordingly on 05/03/2018 she proceeded to biopsy of the right breast area in question and one suspicious lymph node. The pathology from this procedure showed ([id number]): Right Breast: invasive mammary carcinoma, grade 3, E-cadherin positive (and therefore ductal) lymphovascular invasion present. Lymph node: Invasive mammary carcinoma Prognostic indicators significant for: estrogen receptor, 0% negative and progesterone receptor,  0% negative. Proliferation marker Ki67 at 75%. HER2 negative (1+).  The patient's subsequent history is as detailed below.   INTERVAL HISTORY: Darlene Grant returns today for follow-up and treatment of her triple negative breast cancer.  She continues on  neoadjuvant chemotherapy consisting of doxorubicin and cyclophosphamide in dose dense fashion x4. Today is day 9 cycle 4.  This will be followed by weekly paclitaxel and carboplatin starting 08/02/2018. She experienced fatigue with treatment. She also had diarrhea after 1st treatment since she had to play around with Miralax dosage. She mostly had fatigue throughout her treatments, however. She did experience acid reflux 5 or 6 days out after each cycle as well. Her appetite isn't there. Her taste is ok. She feels tight in her throat and chest.   Since her last visit here, she underwent an echocardiogram on 07/22/2018. Results are still pending at time of appointment.    REVIEW OF SYSTEMS: Darlene Grant states that she is nervous about her treatments. She has been exercising as she is able to; during her 3rd treatment she exercised 3 times a week, but since her 4th treatment she has felt dehydrated and didn't want to push herself. She feels nausea when taking omeprazole, and vomited yesterday (07/22/2018). She feels fuller in her right breast where the tumor was with phantom pain. She had a period last month, which she described as her worst period ever. The patient denies unusual headaches, visual changes, nausea, vomiting, or dizziness. There has been no unusual cough, phlegm production, or pleurisy. This been no change in bowel or bladder habits. The patient denies unexplained fatigue or unexplained weight loss, bleeding, rash, or fever. A detailed review of systems was otherwise noncontributory.    PAST MEDICAL HISTORY: Past Medical History:  Diagnosis Date  . Abnormal Pap smear    ASCUS 03/13/11  .  Anemia   . Breast pain, left   . Cancer ([place])   .  Candida vaginitis   . Complication of anesthesia    pt states that all narcotics make her angry and she would prefer to avoid them.  . Rectal itching   . Vaginal Pap smear, abnormal     PAST SURGICAL HISTORY: Past Surgical History:  Procedure Laterality Date  . BREAST BIOPSY Right 8/16  . CESAREAN SECTION N/A 11/21/2013   Procedure: CESAREAN SECTION;  Surgeon: Alwyn Pea, MD;  Location: [facility];  Service: Obstetrics;  Laterality: N/A;  . CESAREAN SECTION N/A 06/23/2016   Procedure: CESAREAN SECTION;  Surgeon: Emily Filbert, MD;  Location: [facility];  Service: Obstetrics;  Laterality: N/A;  . HAND SURGERY  04/2012   ligament repair  . PILONIDAL CYST EXCISION  2000  . PORTACATH PLACEMENT N/A 05/28/2018   Procedure: INSERTION PORT-A-CATH WITH ULTRASOUND;  Surgeon: Rolm Bookbinder, MD;  Location: [facility];  Service: General;  Laterality: N/A;  . WISDOM TOOTH EXTRACTION  AGE 49    FAMILY HISTORY Family History  Problem Relation Age of Onset  . Cancer Mother 80       breast, premenopausal  . Breast cancer Mother   . Cancer Maternal Grandmother        skin, stomach  . Cancer Maternal Grandfather        lung   As of November 2019 her father is 43 years old with a history of Parkinson's. Patients' mother is 50 years old with a history of breast cancer, diagnosed age 36, treated with radical mastectomy. The patient has no brothers and two sisters. Besides the patient's mother, the patient has no other family medical history of breast or ovarian cancer. Her maternal grandmother had stomach cancer and her maternal grandfather had lung cancer, --he was a smoker.    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  Her husband, [name], an Chief Financial Officer. She has two children, [name], age 53, and [name], age 68 months.  The patient is not a smoker. She does drink alcohol and drinks an estimated one to two drinks per week.     ADVANCED DIRECTIVES: [name], her husband, is her healthcare department   HEALTH MAINTENANCE: Social History   Tobacco Use  . Smoking status: Never Smoker  . Smokeless tobacco: Never Used  Substance Use Topics  . Alcohol use: Yes    Alcohol/week: 0.0 standard drinks    Comment: occassionally  . Drug use: No    Colonoscopy: no  PAP: 12/2017  Bone density: no   Allergies  Allergen Reactions  . Augmentin [Amoxicillin-Pot Clavulanate]     diarrhea  . Hydrocodone Itching  . Keflex [Cephalexin] Diarrhea    Current Outpatient Medications  Medication Sig Dispense Refill  . Alum & Mag Hydroxide-Simeth (GI COCKTAIL) SUSP suspension Take 30 mLs by mouth daily as needed for indigestion. Shake well. 60 mL 0  . Black Elderberry 50 MG/5ML SYRP Take by mouth.    . ciprofloxacin (CIPRO) 500 MG tablet Take one tablet by mouth 2 times daily for 5 days  after each chemo cycle, starting on day 8 of chemo 40 tablet 1  . cyclobenzaprine (FLEXERIL) 10 MG tablet Take 0.5-1 tablets (5-10 mg total) by mouth 3 (three) times daily as needed for muscle spasms. 30 tablet 1  . dexamethasone (DECADRON) 4 MG tablet Take 2 tablets by mouth once a day on the day after chemotherapy and then take 2 tablets two times a day for 2 days. Take with food. 30 tablet 1  . lidocaine-prilocaine (EMLA) cream Apply to affected area once 30 g 3  . loratadine (CLARITIN) 10 MG tablet Take 1 tablet (10 mg total) by mouth daily. 90 tablet 0  . LORazepam (ATIVAN) 0.5 MG tablet Take 1 tablet (0.5 mg total) by mouth at bedtime as needed (Nausea or vomiting). 30 tablet 0  . omeprazole (PRILOSEC) 40 MG capsule Take 1 capsule (40 mg total) by mouth daily. 30 capsule 5  . polyethylene glycol (MIRALAX) 0.34 gm/ml  SOLN     . Probiotic Product (PROBIOTIC DAILY PO) Take 1 tablet by mouth.    . prochlorperazine (COMPAZINE) 10 MG tablet Take 1 tablet (10 mg total) by mouth every 6 (six) hours as needed (Nausea or vomiting). 30 tablet 1  . sucralfate (CARAFATE) 1 g tablet Take 1 tablet (1 g total) by mouth 4 (four) times daily -  with meals and at bedtime. 40 tablet 0   No current facility-administered medications for this visit.      OBJECTIVE: Young white Grant in no acute distress  Vitals:   07/23/18 1516  BP: 110/62  Pulse: 79  Resp: 18  Temp: 98.7 F (37.1 C)  SpO2: 100%     Body mass index is 24.13 kg/m.   Wt Readings from Last 3 Encounters:  07/23/18 141 lb 11.2 oz (64.3 kg)  07/15/18 144 lb (65.3 kg)  07/08/18 143 lb 6.4 oz (65 kg)  ECOG FS:1  Sclerae unicteric, EOMs intact Oropharynx clear and moist No cervical or supraclavicular adenopathy Lungs no rales or rhonchi Heart regular rate and rhythm Abd soft, nontender, positive bowel sounds MSK no focal spinal tenderness, no upper extremity lymphedema Neuro: nonfocal, well oriented, appropriate affect Breasts: I do not feel a mass in the right breast.  Subjectively she feels it is thicker or perhaps larger.  The left breast is unremarkable.  Both axillae are benign.    LAB RESULTS:  CMP     Component Value Date/Time   NA 138 07/23/2018 1456   K 3.9 07/23/2018 1456   CL 103 07/23/2018 1456   CO2 28 07/23/2018 1456   GLUCOSE 92 07/23/2018 1456   BUN 15 07/23/2018 1456   CREATININE 0.73 07/23/2018 1456   CREATININE 0.76 05/15/2018 0827   CREATININE 0.78 01/15/2018 1033   CALCIUM 9.2 07/23/2018 1456   PROT 6.8 07/23/2018 1456   ALBUMIN 4.3 07/23/2018 1456   AST 11 (L) 07/23/2018 1456   AST 10 (L) 05/15/2018 0827   ALT 8 07/23/2018 1456   ALT 9 05/15/2018 0827   ALKPHOS 104 07/23/2018 1456   BILITOT 0.6 07/23/2018 1456   BILITOT 1.1 05/15/2018 0827   GFRNONAA >60 07/23/2018 1456   GFRNONAA >60 05/15/2018 0827    GFRNONAA 95 01/15/2018 1033   GFRAA >60 07/23/2018 1456   GFRAA >60 05/15/2018 0827   GFRAA 110 01/15/2018 1033    No results found for: TOTALPROTELP, ALBUMINELP, A1GS, A2GS, BETS, BETA2SER, GAMS, MSPIKE, SPEI  No results found for: KPAFRELGTCHN, LAMBDASER, KAPLAMBRATIO  Lab Results  Component Value Date  WBC 1.2 (L) 07/23/2018   NEUTROABS 0.6 (L) 07/23/2018   HGB 8.0 (L) 07/23/2018   HCT 23.8 (L) 07/23/2018   MCV 93.0 07/23/2018   PLT 167 07/23/2018    '@LASTCHEMISTRY'$ @  No results found for: LABCA2  No components found for: QASTMH962  No results for input(s): INR in the last 168 hours.  No results found for: LABCA2  No results found for: IWL798  No results found for: XQJ194  No results found for: RDE081  No results found for: CA2729  No components found for: HGQUANT  No results found for: CEA1 / No results found for: CEA1   No results found for: AFPTUMOR  No results found for: CHROMOGRNA  No results found for: PSA1  Appointment on 07/23/2018  Component Date Value Ref Range Status  . Sodium 07/23/2018 138  135 - 145 mmol/L Final  . Potassium 07/23/2018 3.9  3.5 - 5.1 mmol/L Final  . Chloride 07/23/2018 103  98 - 111 mmol/L Final  . CO2 07/23/2018 28  22 - 32 mmol/L Final  . Glucose, Bld 07/23/2018 92  70 - 99 mg/dL Final  . BUN 07/23/2018 15  6 - 20 mg/dL Final  . Creatinine, Ser 07/23/2018 0.73  0.44 - 1.00 mg/dL Final  . Calcium 07/23/2018 9.2  8.9 - 10.3 mg/dL Final  . Total Protein 07/23/2018 6.8  6.5 - 8.1 g/dL Final  . Albumin 07/23/2018 4.3  3.5 - 5.0 g/dL Final  . AST 07/23/2018 11* 15 - 41 U/L Final  . ALT 07/23/2018 8  0 - 44 U/L Final  . Alkaline Phosphatase 07/23/2018 104  38 - 126 U/L Final  . Total Bilirubin 07/23/2018 0.6  0.3 - 1.2 mg/dL Final  . GFR calc non Af Amer 07/23/2018 >60  >60 mL/min Final  . GFR calc Af Amer 07/23/2018 >60  >60 mL/min Final  . Anion gap 07/23/2018 7  5 - 15 Final   Performed at [facility], [place] [address] [place], [place] [address]  . WBC 07/23/2018 1.2* 4.0 - 10.5 K/uL Final  . RBC 07/23/2018 2.56* 3.87 - 5.11 MIL/uL Final  . Hemoglobin 07/23/2018 8.0* 12.0 - 15.0 g/dL Final  . HCT 07/23/2018 23.8* 36.0 - 46.0 % Final  . MCV 07/23/2018 93.0  80.0 - 100.0 fL Final  . MCH 07/23/2018 31.3  26.0 - 34.0 pg Final  . MCHC 07/23/2018 33.6  30.0 - 36.0 g/dL Final  . RDW 07/23/2018 14.9  11.5 - 15.5 % Final  . Platelets 07/23/2018 167  150 - 400 K/uL Final  . nRBC 07/23/2018 0.0  0.0 - 0.2 % Final  . Neutrophils Relative % 07/23/2018 51  % Final  . Neutro Abs 07/23/2018 0.6* 1.7 - 7.7 K/uL Final  . Lymphocytes Relative 07/23/2018 27  % Final  . Lymphs Abs 07/23/2018 0.3* 0.7 - 4.0 K/uL Final  . Monocytes Relative 07/23/2018 14  % Final  . Monocytes Absolute 07/23/2018 0.2  0.1 - 1.0 K/uL Final  . Eosinophils Relative 07/23/2018 1  % Final  . Eosinophils Absolute 07/23/2018 0.0  0.0 - 0.5 K/uL Final  . Basophils Relative 07/23/2018 1  % Final  . Basophils Absolute 07/23/2018 0.0  0.0 - 0.1 K/uL Final  . Immature Granulocytes 07/23/2018 6  % Final   Increased IG's, likely caused by Bone Marrow Colony Stimulating Factor received within 30 days.  . Abs Immature Granulocytes 07/23/2018 0.07  0.00 - 0.07 K/uL Final  . Tear Drop  Cells 07/23/2018 PRESENT   Final  . Polychromasia 07/23/2018 PRESENT   Final  . Ovalocytes 07/23/2018 PRESENT   Final   Performed at [facility], [place] [address], [place], [place] [address]    (this displays the last labs from the last 3 days)  No results found for: TOTALPROTELP, ALBUMINELP, A1GS, A2GS, BETS, BETA2SER, GAMS, MSPIKE, SPEI (this displays SPEP labs)  No results found for: KPAFRELGTCHN, LAMBDASER, KAPLAMBRATIO (kappa/lambda light chains)  No results found for: HGBA, HGBA2QUANT, HGBFQUANT, HGBSQUAN (Hemoglobinopathy evaluation)   No results found for: LDH  Lab Results  Component Value Date   IRON 64  07/15/2018   TIBC 265 07/15/2018   IRONPCTSAT 24 07/15/2018   (Iron and TIBC)  Lab Results  Component Value Date   FERRITIN 92 07/15/2018    Urinalysis    Component Value Date/Time   COLORURINE YELLOW 12/18/2016 [place] 12/18/2016 1607   LABSPEC 1.010 12/18/2016 1607   PHURINE 7.0 12/18/2016 1607   GLUCOSEU NEGATIVE 12/18/2016 1607   HGBUR NEGATIVE 12/18/2016 1607   HGBUR negative 01/13/2010 0940   BILIRUBINUR NEGATIVE 12/18/2016 1607   BILIRUBINUR neg 05/17/2015 [place] 12/18/2016 1607   PROTEINUR NEGATIVE 12/18/2016 1607   UROBILINOGEN 0.2 05/17/2015 1034   UROBILINOGEN 0.2 01/13/2010 0940   NITRITE NEGATIVE 12/18/2016 1607   LEUKOCYTESUR NEGATIVE 12/18/2016 1607    STUDIES:  No results found.   ELIGIBLE FOR AVAILABLE RESEARCH PROTOCOL: Upbeat   ASSESSMENT: 43 y.o. Darlene Grant, status post right breast upper outer quadrant and right axillary lymph node biopsy 05/03/2018 for a clinical T2 N2, stage IIIC invasive ductal carcinoma, triple negative, with an MIB-1 of 75%  (a) staging studies with CT scans of the chest abdomen and pelvis on 05/29/18 show no distant metastases; bone scan shows questionable focus of tracer accumulation on the proximal to mid right femoral diaphysis  (b) MRI breast on 05/31/18 shows additional non mass like enhancement throughout upper outer right breast that extends 5cm inferomedially from biopsy proven malignancy.    (1) genetics testing 06/06/2018--at [facility]--negative 11 gene panel   (2) neoadjuvant chemotherapy consisting of doxorubicin and cyclophosphamide in dose dense fashion x4 started 06/03/2018, to be  followed by paclitaxel and carboplatin weekly x12 starting 08/02/2018 (a) Echo on 05/23/2018 shows an EF of 60-65%.  (3) definitive surgery to follow (patient is planning on bilateral mastectomies)  (4) adjuvant radiation to follow surgery   PLAN: Darlene Grant has completed the  more difficult part of the chemotherapy or perhaps better but the more intense part of the chemotherapy.  She did generally very well although she had significant problems with reflux and some fatigue.  There have been no major complications  She is now ready to start part 2.  She will receive paclitaxel and carboplatin on a weekly basis.  Today we discussed the possible toxicity side effects and complications of these agents in detail and she will also discuss this with our chemotherapy teaching nurse.  She will start treatment on 08/02/2018 and I will see her a week later to troubleshoot side effects.  She feels a change in her right breast.  At this point I do not palpate a mass.  Nevertheless it will be informative to obtain an echocardiogram and it certainly will be reassuring to her.  This order was placed  She will take Compazine on the evening of treatment on the next morning and then as needed.  She also has  dexamethasone and lorazepam which she is welcome to use if nausea becomes a problem.  It generally is not however with these more attenuated chemotherapy treatments.  She will take ciprofloxacin for the next 3 days for her neutropenia  She did not get any benefit from omeprazole which made her nauseated and we are stopping that medication.  I have encouraged her to call us with any problem and particularly if the slightest hint of peripheral neuropathy develops     Song Garris, Virgie Dad, MD  07/23/18 3:56 PM Medical Oncology and Hematology [facility] [address] [place], [place] [address] Tel. [phone]    Fax. [phone]  I, Jacqualyn Posey am acting as a Education administrator for Chauncey Cruel, MD.   I, Lurline Del MD, have reviewed the above documentation for accuracy and completeness, and I agree with the above.

## 2018-07-22 NOTE — Telephone Encounter (Signed)
Nurse spoke with patient this morning about current c/o vomiting after taking omeprazole.  Patient is concerned that omeprazole is not working and may want to try something different.  Patient confirmed that she did take compazine this am to help with vomiting.  She told nurse that she was not sure she would be able to make her Echo today because of the way she feels.  Nurse spoke with NP about above.  GI cocktail and Carafate sent in to [facility].  Patient was also offered to see Lucianne Lei in [facility] for fluids if needed.  Patient voiced understanding of above and said that she would call back if she decided to come in.

## 2018-07-22 NOTE — Telephone Encounter (Signed)
Received pt call regarding not making her appt today. Pt states that she has severe acid reflux and will need stronger medication to take. Pt would like to reschedule her appt soon. Discussed with Dr.Magrinat and will see pt tomorrow for labs/md appt. Sent maalox to [facility] electronically. Pt confirmed time/date of appt tomorrow and is appreciative of time and call.

## 2018-07-22 NOTE — Progress Notes (Signed)
  Echocardiogram 2D Echocardiogram has been performed.  Darlina Sicilian M 07/22/2018, 11:45 AM

## 2018-07-22 NOTE — Telephone Encounter (Signed)
"  Darlene Grant [phone]) I'm not feeling well enough to come in.  Took omeprazole for acid reflux and threw up.  Can't drive myself today without throwing up.  GI cocktail was ordered previously.  The omeprazole is not cutting it."

## 2018-07-22 NOTE — Telephone Encounter (Signed)
Please call patient.  Do we need to send in GI cocktail?  Carafate?  We can send in additional items if needed.

## 2018-07-23 ENCOUNTER — Encounter: Payer: Self-pay | Admitting: *Deleted

## 2018-07-23 ENCOUNTER — Inpatient Hospital Stay: Payer: BLUE CROSS/BLUE SHIELD

## 2018-07-23 ENCOUNTER — Inpatient Hospital Stay: Payer: BLUE CROSS/BLUE SHIELD | Attending: Oncology | Admitting: Oncology

## 2018-07-23 ENCOUNTER — Telehealth: Payer: Self-pay | Admitting: Oncology

## 2018-07-23 VITALS — BP 110/62 | HR 79 | Temp 98.7°F | Resp 18 | Ht 64.25 in | Wt 141.7 lb

## 2018-07-23 DIAGNOSIS — Z5111 Encounter for antineoplastic chemotherapy: Secondary | ICD-10-CM | POA: Diagnosis not present

## 2018-07-23 DIAGNOSIS — Z171 Estrogen receptor negative status [ER-]: Secondary | ICD-10-CM

## 2018-07-23 DIAGNOSIS — C50411 Malignant neoplasm of upper-outer quadrant of right female breast: Secondary | ICD-10-CM

## 2018-07-23 DIAGNOSIS — K219 Gastro-esophageal reflux disease without esophagitis: Secondary | ICD-10-CM | POA: Diagnosis not present

## 2018-07-23 LAB — CBC WITH DIFFERENTIAL/PLATELET
ABS IMMATURE GRANULOCYTES: 0.07 10*3/uL (ref 0.00–0.07)
Basophils Absolute: 0 10*3/uL (ref 0.0–0.1)
Basophils Relative: 1 %
Eosinophils Absolute: 0 10*3/uL (ref 0.0–0.5)
Eosinophils Relative: 1 %
HCT: 23.8 % — ABNORMAL LOW (ref 36.0–46.0)
Hemoglobin: 8 g/dL — ABNORMAL LOW (ref 12.0–15.0)
Immature Granulocytes: 6 %
Lymphocytes Relative: 27 %
Lymphs Abs: 0.3 10*3/uL — ABNORMAL LOW (ref 0.7–4.0)
MCH: 31.3 pg (ref 26.0–34.0)
MCHC: 33.6 g/dL (ref 30.0–36.0)
MCV: 93 fL (ref 80.0–100.0)
Monocytes Absolute: 0.2 10*3/uL (ref 0.1–1.0)
Monocytes Relative: 14 %
NEUTROS ABS: 0.6 10*3/uL — AB (ref 1.7–7.7)
Neutrophils Relative %: 51 %
Platelets: 167 10*3/uL (ref 150–400)
RBC: 2.56 MIL/uL — ABNORMAL LOW (ref 3.87–5.11)
RDW: 14.9 % (ref 11.5–15.5)
WBC: 1.2 10*3/uL — ABNORMAL LOW (ref 4.0–10.5)
nRBC: 0 % (ref 0.0–0.2)

## 2018-07-23 LAB — COMPREHENSIVE METABOLIC PANEL
ALBUMIN: 4.3 g/dL (ref 3.5–5.0)
ALT: 8 U/L (ref 0–44)
AST: 11 U/L — ABNORMAL LOW (ref 15–41)
Alkaline Phosphatase: 104 U/L (ref 38–126)
Anion gap: 7 (ref 5–15)
BUN: 15 mg/dL (ref 6–20)
CHLORIDE: 103 mmol/L (ref 98–111)
CO2: 28 mmol/L (ref 22–32)
Calcium: 9.2 mg/dL (ref 8.9–10.3)
Creatinine, Ser: 0.73 mg/dL (ref 0.44–1.00)
GFR calc Af Amer: >60 mL/min (ref >60)
GFR calc non Af Amer: >60 mL/min (ref >60)
Glucose, Bld: 92 mg/dL (ref 70–99)
POTASSIUM: 3.9 mmol/L (ref 3.5–5.1)
Sodium: 138 mmol/L (ref 135–145)
Total Bilirubin: 0.6 mg/dL (ref 0.3–1.2)
Total Protein: 6.8 g/dL (ref 6.5–8.1)

## 2018-07-23 NOTE — Telephone Encounter (Signed)
Spoke with MD, ok to overbook. Gave avs and calendar

## 2018-07-24 ENCOUNTER — Other Ambulatory Visit (HOSPITAL_COMMUNITY): Payer: BLUE CROSS/BLUE SHIELD

## 2018-07-25 ENCOUNTER — Encounter: Payer: Self-pay | Admitting: Oncology

## 2018-07-26 ENCOUNTER — Telehealth: Payer: Self-pay | Admitting: *Deleted

## 2018-07-29 ENCOUNTER — Encounter: Payer: Self-pay | Admitting: Oncology

## 2018-07-31 DIAGNOSIS — Z171 Estrogen receptor negative status [ER-]: Secondary | ICD-10-CM | POA: Diagnosis not present

## 2018-07-31 DIAGNOSIS — C50411 Malignant neoplasm of upper-outer quadrant of right female breast: Secondary | ICD-10-CM | POA: Diagnosis not present

## 2018-08-02 ENCOUNTER — Inpatient Hospital Stay: Payer: BLUE CROSS/BLUE SHIELD

## 2018-08-02 ENCOUNTER — Other Ambulatory Visit: Payer: Self-pay | Admitting: *Deleted

## 2018-08-02 ENCOUNTER — Encounter: Payer: Self-pay | Admitting: Adult Health

## 2018-08-02 ENCOUNTER — Inpatient Hospital Stay (HOSPITAL_BASED_OUTPATIENT_CLINIC_OR_DEPARTMENT_OTHER): Payer: BLUE CROSS/BLUE SHIELD | Admitting: Adult Health

## 2018-08-02 ENCOUNTER — Encounter: Payer: Self-pay | Admitting: *Deleted

## 2018-08-02 ENCOUNTER — Telehealth: Payer: Self-pay | Admitting: Adult Health

## 2018-08-02 VITALS — BP 104/76 | HR 80 | Temp 98.2°F | Resp 18

## 2018-08-02 VITALS — BP 142/75 | HR 86 | Temp 98.9°F | Resp 18 | Ht 64.25 in | Wt 140.5 lb

## 2018-08-02 DIAGNOSIS — Z95828 Presence of other vascular implants and grafts: Secondary | ICD-10-CM

## 2018-08-02 DIAGNOSIS — Z171 Estrogen receptor negative status [ER-]: Secondary | ICD-10-CM

## 2018-08-02 DIAGNOSIS — C50411 Malignant neoplasm of upper-outer quadrant of right female breast: Secondary | ICD-10-CM

## 2018-08-02 DIAGNOSIS — K219 Gastro-esophageal reflux disease without esophagitis: Secondary | ICD-10-CM | POA: Diagnosis not present

## 2018-08-02 DIAGNOSIS — Z5111 Encounter for antineoplastic chemotherapy: Secondary | ICD-10-CM | POA: Diagnosis not present

## 2018-08-02 LAB — COMPREHENSIVE METABOLIC PANEL
ALBUMIN: 4.4 g/dL (ref 3.5–5.0)
ALT: 9 U/L (ref 0–44)
AST: 12 U/L — ABNORMAL LOW (ref 15–41)
Alkaline Phosphatase: 86 U/L (ref 38–126)
Anion gap: 9 (ref 5–15)
BUN: 15 mg/dL (ref 6–20)
CO2: 27 mmol/L (ref 22–32)
Calcium: 9.5 mg/dL (ref 8.9–10.3)
Chloride: 104 mmol/L (ref 98–111)
Creatinine, Ser: 0.76 mg/dL (ref 0.44–1.00)
GFR calc Af Amer: >60 mL/min (ref >60)
GFR calc non Af Amer: >60 mL/min (ref >60)
GLUCOSE: 94 mg/dL (ref 70–99)
Potassium: 4.1 mmol/L (ref 3.5–5.1)
SODIUM: 140 mmol/L (ref 135–145)
Total Bilirubin: 0.5 mg/dL (ref 0.3–1.2)
Total Protein: 6.8 g/dL (ref 6.5–8.1)

## 2018-08-02 LAB — CBC WITH DIFFERENTIAL/PLATELET
Abs Immature Granulocytes: 0.11 10*3/uL — ABNORMAL HIGH (ref 0.00–0.07)
Basophils Absolute: 0 10*3/uL (ref 0.0–0.1)
Basophils Relative: 0 %
Eosinophils Absolute: 0.1 10*3/uL (ref 0.0–0.5)
Eosinophils Relative: 1 %
HCT: 28.5 % — ABNORMAL LOW (ref 36.0–46.0)
Hemoglobin: 9.4 g/dL — ABNORMAL LOW (ref 12.0–15.0)
Immature Granulocytes: 2 %
Lymphocytes Relative: 11 %
Lymphs Abs: 0.7 10*3/uL (ref 0.7–4.0)
MCH: 32 pg (ref 26.0–34.0)
MCHC: 33 g/dL (ref 30.0–36.0)
MCV: 96.9 fL (ref 80.0–100.0)
MONOS PCT: 8 %
Monocytes Absolute: 0.5 10*3/uL (ref 0.1–1.0)
NEUTROS PCT: 78 %
Neutro Abs: 5.2 10*3/uL (ref 1.7–7.7)
Platelets: 211 10*3/uL (ref 150–400)
RBC: 2.94 MIL/uL — ABNORMAL LOW (ref 3.87–5.11)
RDW: 17.5 % — AB (ref 11.5–15.5)
WBC: 6.6 10*3/uL (ref 4.0–10.5)
nRBC: 0 % (ref 0.0–0.2)

## 2018-08-02 MED ORDER — SODIUM CHLORIDE 0.9 % IV SOLN
Freq: Once | INTRAVENOUS | Status: AC
  Administered 2018-08-02: 13:00:00 via INTRAVENOUS
  Filled 2018-08-02: qty 5

## 2018-08-02 MED ORDER — DIPHENHYDRAMINE HCL 50 MG/ML IJ SOLN
INTRAMUSCULAR | Status: AC
  Filled 2018-08-02: qty 1

## 2018-08-02 MED ORDER — DIPHENHYDRAMINE HCL 50 MG/ML IJ SOLN
25.0000 mg | Freq: Once | INTRAMUSCULAR | Status: AC
  Administered 2018-08-02: 25 mg via INTRAVENOUS

## 2018-08-02 MED ORDER — ACETAMINOPHEN 325 MG PO TABS
650.0000 mg | ORAL_TABLET | Freq: Once | ORAL | Status: AC
  Administered 2018-08-02: 650 mg via ORAL

## 2018-08-02 MED ORDER — SODIUM CHLORIDE 0.9 % IV SOLN
243.0000 mg | Freq: Once | INTRAVENOUS | Status: AC
  Administered 2018-08-02: 240 mg via INTRAVENOUS
  Filled 2018-08-02: qty 24

## 2018-08-02 MED ORDER — SODIUM CHLORIDE 0.9 % IV SOLN
Freq: Once | INTRAVENOUS | Status: AC
  Administered 2018-08-02: 13:00:00 via INTRAVENOUS
  Filled 2018-08-02: qty 250

## 2018-08-02 MED ORDER — PALONOSETRON HCL INJECTION 0.25 MG/5ML
0.2500 mg | Freq: Once | INTRAVENOUS | Status: AC
  Administered 2018-08-02: 0.25 mg via INTRAVENOUS

## 2018-08-02 MED ORDER — PALONOSETRON HCL INJECTION 0.25 MG/5ML
INTRAVENOUS | Status: AC
  Filled 2018-08-02: qty 5

## 2018-08-02 MED ORDER — FAMOTIDINE IN NACL 20-0.9 MG/50ML-% IV SOLN
20.0000 mg | Freq: Once | INTRAVENOUS | Status: AC
  Administered 2018-08-02: 20 mg via INTRAVENOUS

## 2018-08-02 MED ORDER — SODIUM CHLORIDE 0.9% FLUSH
10.0000 mL | INTRAVENOUS | Status: DC | PRN
  Administered 2018-08-02: 10 mL
  Filled 2018-08-02: qty 10

## 2018-08-02 MED ORDER — SODIUM CHLORIDE 0.9 % IV SOLN
80.0000 mg/m2 | Freq: Once | INTRAVENOUS | Status: AC
  Administered 2018-08-02: 138 mg via INTRAVENOUS
  Filled 2018-08-02: qty 23

## 2018-08-02 MED ORDER — FAMOTIDINE IN NACL 20-0.9 MG/50ML-% IV SOLN
INTRAVENOUS | Status: AC
  Filled 2018-08-02: qty 50

## 2018-08-02 MED ORDER — ACETAMINOPHEN 325 MG PO TABS
ORAL_TABLET | ORAL | Status: AC
  Filled 2018-08-02: qty 2

## 2018-08-02 MED ORDER — HEPARIN SOD (PORK) LOCK FLUSH 100 UNIT/ML IV SOLN
500.0000 [IU] | Freq: Once | INTRAVENOUS | Status: AC | PRN
  Administered 2018-08-02: 500 [IU]
  Filled 2018-08-02: qty 5

## 2018-08-02 NOTE — Patient Instructions (Signed)
[facility] Discharge Instructions for Patients Receiving Chemotherapy  Today you received the following chemotherapy agents Paclitaxel (TAXOL) & Carboplatin (PARAPLATIN).  To help prevent nausea and vomiting after your treatment, we encourage you to take your nausea medication as prescribed.   If you develop nausea and vomiting that is not controlled by your nausea medication, call the clinic.   BELOW ARE SYMPTOMS THAT SHOULD BE REPORTED IMMEDIATELY:  *FEVER GREATER THAN 100.5 F  *CHILLS WITH OR WITHOUT FEVER  NAUSEA AND VOMITING THAT IS NOT CONTROLLED WITH YOUR NAUSEA MEDICATION  *UNUSUAL SHORTNESS OF BREATH  *UNUSUAL BRUISING OR BLEEDING  TENDERNESS IN MOUTH AND THROAT WITH OR WITHOUT PRESENCE OF ULCERS  *URINARY PROBLEMS  *BOWEL PROBLEMS  UNUSUAL RASH Items with * indicate a potential emergency and should be followed up as soon as possible.  Feel free to call the clinic should you have any questions or concerns. The clinic phone number is [phone].  Please show the CHEMO ALERT CARD at check-in to the Emergency Department and triage nurse.   Paclitaxel injection What is this medicine? PACLITAXEL (PAK li TAX el) is a chemotherapy drug. It targets fast dividing cells, like cancer cells, and causes these cells to die. This medicine is used to treat ovarian cancer, breast cancer, lung cancer, Kaposi's sarcoma, and other cancers. This medicine may be used for other purposes; ask your health care provider or pharmacist if you have questions. COMMON BRAND NAME(S): Onxol, Taxol What should I tell my health care provider before I take this medicine? They need to know if you have any of these conditions: -history of irregular heartbeat -liver disease -low blood counts, like low white cell, platelet, or red cell counts -lung or breathing disease, like asthma -tingling of the fingers or toes, or other nerve disorder -an unusual or allergic reaction to  paclitaxel, alcohol, polyoxyethylated castor oil, other chemotherapy, other medicines, foods, dyes, or preservatives -pregnant or trying to get pregnant -breast-feeding How should I use this medicine? This drug is given as an infusion into a vein. It is administered in a hospital or clinic by a specially trained health care professional. Talk to your pediatrician regarding the use of this medicine in children. Special care may be needed. Overdosage: If you think you have taken too much of this medicine contact a poison control center or emergency room at once. NOTE: This medicine is only for you. Do not share this medicine with others. What if I miss a dose? It is important not to miss your dose. Call your doctor or health care professional if you are unable to keep an appointment. What may interact with this medicine? Do not take this medicine with any of the following medications: -disulfiram -metronidazole This medicine may also interact with the following medications: -antiviral medicines for hepatitis, HIV or AIDS -certain antibiotics like erythromycin and clarithromycin -certain medicines for fungal infections like ketoconazole and itraconazole -certain medicines for seizures like carbamazepine, phenobarbital, phenytoin -gemfibrozil -nefazodone -rifampin -St. John's wort This list may not describe all possible interactions. Give your health care provider a list of all the medicines, herbs, non-prescription drugs, or dietary supplements you use. Also tell them if you smoke, drink alcohol, or use illegal drugs. Some items may interact with your medicine. What should I watch for while using this medicine? Your condition will be monitored carefully while you are receiving this medicine. You will need important blood work done while you are taking this medicine. This medicine can cause serious allergic   reactions. To reduce your risk you will need to take other medicine(s) before treatment  with this medicine. If you experience allergic reactions like skin rash, itching or hives, swelling of the face, lips, or tongue, tell your doctor or health care professional right away. In some cases, you may be given additional medicines to help with side effects. Follow all directions for their use. This drug may make you feel generally unwell. This is not uncommon, as chemotherapy can affect healthy cells as well as cancer cells. Report any side effects. Continue your course of treatment even though you feel ill unless your doctor tells you to stop. Call your doctor or health care professional for advice if you get a fever, chills or sore throat, or other symptoms of a cold or flu. Do not treat yourself. This drug decreases your body's ability to fight infections. Try to avoid being around people who are sick. This medicine may increase your risk to bruise or bleed. Call your doctor or health care professional if you notice any unusual bleeding. Be careful brushing and flossing your teeth or using a toothpick because you may get an infection or bleed more easily. If you have any dental work done, tell your dentist you are receiving this medicine. Avoid taking products that contain aspirin, acetaminophen, ibuprofen, naproxen, or ketoprofen unless instructed by your doctor. These medicines may hide a fever. Do not become pregnant while taking this medicine. Women should inform their doctor if they wish to become pregnant or think they might be pregnant. There is a potential for serious side effects to an unborn child. Talk to your health care professional or pharmacist for more information. Do not breast-feed an infant while taking this medicine. Men are advised not to father a child while receiving this medicine. This product may contain alcohol. Ask your pharmacist or healthcare provider if this medicine contains alcohol. Be sure to tell all healthcare providers you are taking this medicine. Certain  medicines, like metronidazole and disulfiram, can cause an unpleasant reaction when taken with alcohol. The reaction includes flushing, headache, nausea, vomiting, sweating, and increased thirst. The reaction can last from 30 minutes to several hours. What side effects may I notice from receiving this medicine? Side effects that you should report to your doctor or health care professional as soon as possible: -allergic reactions like skin rash, itching or hives, swelling of the face, lips, or tongue -breathing problems -changes in vision -fast, irregular heartbeat -high or low blood pressure -mouth sores -pain, tingling, numbness in the hands or feet -signs of decreased platelets or bleeding - bruising, pinpoint red spots on the skin, black, tarry stools, blood in the urine -signs of decreased red blood cells - unusually weak or tired, feeling faint or lightheaded, falls -signs of infection - fever or chills, cough, sore throat, pain or difficulty passing urine -signs and symptoms of liver injury like dark yellow or brown urine; general ill feeling or flu-like symptoms; light-colored stools; loss of appetite; nausea; right upper belly pain; unusually weak or tired; yellowing of the eyes or skin -swelling of the ankles, feet, hands -unusually slow heartbeat Side effects that usually do not require medical attention (report to your doctor or health care professional if they continue or are bothersome): -diarrhea -hair loss -loss of appetite -muscle or joint pain -nausea, vomiting -pain, redness, or irritation at site where injected -tiredness This list may not describe all possible side effects. Call your doctor for medical advice about side effects. You may   report side effects to FDA at 1-800-FDA-1088. Where should I keep my medicine? This drug is given in a hospital or clinic and will not be stored at home. NOTE: This sheet is a summary. It may not cover all possible information. If you  have questions about this medicine, talk to your doctor, pharmacist, or health care provider.  2019 [employer] (2017-02-06 13:14:55)   Carboplatin injection What is this medicine? CARBOPLATIN (KAR boe pla tin) is a chemotherapy drug. It targets fast dividing cells, like cancer cells, and causes these cells to die. This medicine is used to treat ovarian cancer and many other cancers. This medicine may be used for other purposes; ask your health care provider or pharmacist if you have questions. COMMON BRAND NAME(S): Paraplatin What should I tell my health care provider before I take this medicine? They need to know if you have any of these conditions: -blood disorders -hearing problems -kidney disease -recent or ongoing radiation therapy -an unusual or allergic reaction to carboplatin, cisplatin, other chemotherapy, other medicines, foods, dyes, or preservatives -pregnant or trying to get pregnant -breast-feeding How should I use this medicine? This drug is usually given as an infusion into a vein. It is administered in a hospital or clinic by a specially trained health care professional. Talk to your pediatrician regarding the use of this medicine in children. Special care may be needed. Overdosage: If you think you have taken too much of this medicine contact a poison control center or emergency room at once. NOTE: This medicine is only for you. Do not share this medicine with others. What if I miss a dose? It is important not to miss a dose. Call your doctor or health care professional if you are unable to keep an appointment. What may interact with this medicine? -medicines for seizures -medicines to increase blood counts like filgrastim, pegfilgrastim, sargramostim -some antibiotics like amikacin, gentamicin, neomycin, streptomycin, tobramycin -vaccines Talk to your doctor or health care professional before taking any of these  medicines: -acetaminophen -aspirin -ibuprofen -ketoprofen -naproxen This list may not describe all possible interactions. Give your health care provider a list of all the medicines, herbs, non-prescription drugs, or dietary supplements you use. Also tell them if you smoke, drink alcohol, or use illegal drugs. Some items may interact with your medicine. What should I watch for while using this medicine? Your condition will be monitored carefully while you are receiving this medicine. You will need important blood work done while you are taking this medicine. This drug may make you feel generally unwell. This is not uncommon, as chemotherapy can affect healthy cells as well as cancer cells. Report any side effects. Continue your course of treatment even though you feel ill unless your doctor tells you to stop. In some cases, you may be given additional medicines to help with side effects. Follow all directions for their use. Call your doctor or health care professional for advice if you get a fever, chills or sore throat, or other symptoms of a cold or flu. Do not treat yourself. This drug decreases your body's ability to fight infections. Try to avoid being around people who are sick. This medicine may increase your risk to bruise or bleed. Call your doctor or health care professional if you notice any unusual bleeding. Be careful brushing and flossing your teeth or using a toothpick because you may get an infection or bleed more easily. If you have any dental work done, tell your dentist you are receiving this   medicine. Avoid taking products that contain aspirin, acetaminophen, ibuprofen, naproxen, or ketoprofen unless instructed by your doctor. These medicines may hide a fever. Do not become pregnant while taking this medicine. Women should inform their doctor if they wish to become pregnant or think they might be pregnant. There is a potential for serious side effects to an unborn child. Talk to  your health care professional or pharmacist for more information. Do not breast-feed an infant while taking this medicine. What side effects may I notice from receiving this medicine? Side effects that you should report to your doctor or health care professional as soon as possible: -allergic reactions like skin rash, itching or hives, swelling of the face, lips, or tongue -signs of infection - fever or chills, cough, sore throat, pain or difficulty passing urine -signs of decreased platelets or bleeding - bruising, pinpoint red spots on the skin, black, tarry stools, nosebleeds -signs of decreased red blood cells - unusually weak or tired, fainting spells, lightheadedness -breathing problems -changes in hearing -changes in vision -chest pain -high blood pressure -low blood counts - This drug may decrease the number of white blood cells, red blood cells and platelets. You may be at increased risk for infections and bleeding. -nausea and vomiting -pain, swelling, redness or irritation at the injection site -pain, tingling, numbness in the hands or feet -problems with balance, talking, walking -trouble passing urine or change in the amount of urine Side effects that usually do not require medical attention (report to your doctor or health care professional if they continue or are bothersome): -hair loss -loss of appetite -metallic taste in the mouth or changes in taste This list may not describe all possible side effects. Call your doctor for medical advice about side effects. You may report side effects to FDA at 1-800-FDA-1088. Where should I keep my medicine? This drug is given in a hospital or clinic and will not be stored at home. NOTE: This sheet is a summary. It may not cover all possible information. If you have questions about this medicine, talk to your doctor, pharmacist, or health care provider.  2019 [employer] Standard (2007-09-10 14:38:05)    

## 2018-08-02 NOTE — Progress Notes (Signed)
[place]  [phone]) [phone] Fax:(336) [phone]     ID: Darlene Grant DOB: [birth year]  MR#: [id number]  VBT#:[id number]  Patient Care Team: Magrinat, Virgie Dad, MD as PCP - General (Oncology) Rolm Bookbinder, MD as Consulting Physician (General Surgery) Magrinat, Virgie Dad, MD as Consulting Physician (Oncology) Gery Pray, MD as Consulting Physician (Radiation Oncology) Emily Filbert, MD as Consulting Physician (Obstetrics and Gynecology) Lavonna Monarch, MD as Consulting Physician (Dermatology) OTHER MD:   CHIEF COMPLAINT: Triple negative breast cancer  CURRENT TREATMENT: Neoadjuvant chemotherapy   HISTORY OF CURRENT ILLNESS: From the original intake note:  Darlene Grant had routine screening mammography on 04/19/2018 showing a possible abnormality in the right breast. She underwent unilateral right diagnostic mammography with tomography and right breast ultrasonography at [facility] on 05/02/2018 showing: Breast Density Category C: There was an oval circumscribed mass in the posterior aspect of the upper outer right breast.  On exam this measured 3.5 cm, was firm, irregular and centered on 10-11 o'clock 5 cm from the nipple.  By ultrasonography there was a 3.7 x 1.7 x 1.5 cm elongated, irregular, hypoechoic mass in the 10:30 o'clock position of the right breast, 5 cm from the nipple.  Ultrasound of the right axilla showed 7 abnormal right axillary lymph nodes highly suspicious for metastatic nodes.  Accordingly on 05/03/2018 she proceeded to biopsy of the right breast area in question and one suspicious lymph node. The pathology from this procedure showed ([id number]): Right Breast: invasive mammary carcinoma, grade 3, E-cadherin positive (and therefore ductal) lymphovascular invasion present. Lymph node: Invasive mammary carcinoma Prognostic indicators significant for: estrogen receptor, 0% negative and progesterone receptor,  0% negative. Proliferation marker Ki67 at 75%. HER2 negative (1+).  The patient's subsequent history is as detailed below.   INTERVAL HISTORY: Darlene Grant returns today for follow-up and treatment of her triple negative breast cancer.  She continues on  neoadjuvant chemotherapy consisting of doxorubicin and cyclophosphamide in dose dense fashion x4.   This will be followed by weekly paclitaxel and carboplatin starting today.     Since her last visit here, she underwent an echocardiogram on 07/22/2018. That showed a normal ejection fraction of 60-65%.    REVIEW OF SYSTEMS: Darlene Grant states that she is doing well today.  She met with Dr. Iran Planas.  She is concerned about reconstruction, and is considering not having any reconstruction.  She is tearful when discussing the decision making process.  She is going to see Dr. Donne Hazel next month to discuss further.    Darlene Grant is eating and drinking well.  Her nausea and indigestion have improved.  She has her ice bags to ice her hands and feet.  She denies any fevers, chills, headaches or vision changes.  She is without any bowel/bladder changes.  A detailed ROS was otherwise non contributory.    PAST MEDICAL HISTORY: Past Medical History:  Diagnosis Date  . Abnormal Pap smear    ASCUS 03/13/11  . Anemia   . Breast pain, left   . Cancer ([place])   . Candida vaginitis   . Complication of anesthesia    pt states that all narcotics make her angry and she would prefer to avoid them.  . Rectal itching   . Vaginal Pap smear, abnormal     PAST SURGICAL HISTORY: Past Surgical History:  Procedure Laterality Date  . BREAST BIOPSY Right 8/16  . CESAREAN SECTION N/A 11/21/2013   Procedure: CESAREAN SECTION;  Surgeon: Alwyn Pea, MD;  Location: [facility];  Service: Obstetrics;  Laterality: N/A;  . CESAREAN SECTION N/A 06/23/2016   Procedure: CESAREAN SECTION;  Surgeon: Emily Filbert, MD;  Location: [facility];  Service: Obstetrics;  Laterality: N/A;    . HAND SURGERY  04/2012   ligament repair  . PILONIDAL CYST EXCISION  2000  . PORTACATH PLACEMENT N/A 05/28/2018   Procedure: INSERTION PORT-A-CATH WITH ULTRASOUND;  Surgeon: Rolm Bookbinder, MD;  Location: [facility];  Service: General;  Laterality: N/A;  . WISDOM TOOTH EXTRACTION  AGE 70    FAMILY HISTORY Family History  Problem Relation Age of Onset  . Cancer Mother 80       breast, premenopausal  . Breast cancer Mother   . Cancer Maternal Grandmother        skin, stomach  . Cancer Maternal Grandfather        lung   As of November 2019 her father is 57 years old with a history of Parkinson's. Patients' mother is 78 years old with a history of breast cancer, diagnosed age 26, treated with radical mastectomy. The patient has no brothers and two sisters. Besides the patient's mother, the patient has no other family medical history of breast or ovarian cancer. Her maternal grandmother had stomach cancer and her maternal grandfather had lung cancer, --he was a smoker.    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  Her husband, [name], an Chief Financial Officer. She has two children, [name], age 21, and [name], age 17 months.  The patient is not a smoker. She does drink alcohol and drinks an estimated one to two drinks per week.     ADVANCED DIRECTIVES: [name], her husband, is her healthcare department   HEALTH MAINTENANCE: Social History   Tobacco Use  . Smoking status: Never Smoker  . Smokeless tobacco: Never Used  Substance Use Topics  . Alcohol use: Yes    Alcohol/week: 0.0 standard drinks    Comment: occassionally  . Drug use: No    Colonoscopy: no  PAP: 12/2017  Bone density: no   Allergies   Allergen Reactions  . Augmentin [Amoxicillin-Pot Clavulanate]     diarrhea  . Hydrocodone Itching  . Keflex [Cephalexin] Diarrhea    Current Outpatient Medications  Medication Sig Dispense Refill  . ciprofloxacin (CIPRO) 500 MG tablet Take one tablet by mouth 2 times daily for 5 days after each chemo cycle, starting on day 8 of chemo 40 tablet 1  . cyclobenzaprine (FLEXERIL) 10 MG tablet Take 0.5-1 tablets (5-10 mg total) by mouth 3 (three) times daily as needed for muscle spasms. 30 tablet 1  . dexamethasone (DECADRON) 4 MG tablet Take 2 tablets by mouth once a day on the day after chemotherapy and then take 2 tablets two times a day for 2 days. Take with food. 30 tablet 1  . lidocaine-prilocaine (EMLA) cream Apply to affected area once 30 g 3  . loratadine (CLARITIN) 10 MG tablet Take 10 mg by mouth daily as needed for allergies.    Marland Kitchen LORazepam (ATIVAN) 0.5 MG tablet Take 1 tablet (0.5 mg total) by mouth at  bedtime as needed (Nausea or vomiting). 30 tablet 0  . polyethylene glycol (MIRALAX) 0.34 gm/ml SOLN     . Probiotic Product (PROBIOTIC DAILY PO) Take 1 tablet by mouth.    . prochlorperazine (COMPAZINE) 10 MG tablet Take 1 tablet (10 mg total) by mouth every 6 (six) hours as needed (Nausea or vomiting). 30 tablet 1  . sucralfate (CARAFATE) 1 g tablet Take 1 tablet (1 g total) by mouth 4 (four) times daily -  with meals and at bedtime. 40 tablet 0   No current facility-administered medications for this visit.      OBJECTIVE:  Vitals:   08/02/18 1115  BP: (!) 142/75  Pulse: 86  Resp: 18  Temp: 98.9 F (37.2 C)  SpO2: 100%     Body mass index is 23.93 kg/m.   Wt Readings from Last 3 Encounters:  08/02/18 140 lb 8 oz (63.7 kg)  07/23/18 141 lb 11.2 oz (64.3 kg)  07/15/18 144 lb (65.3 kg)  ECOG FS:1 GENERAL: Patient is a well appearing female in no acute distress HEENT:  Sclerae anicteric.  Oropharynx clear and moist. No ulcerations or evidence of oropharyngeal candidiasis.  Neck is supple.  NODES:  No cervical, supraclavicular, or axillary lymphadenopathy palpated.  BREAST EXAM:  Deferred. LUNGS:  Clear to auscultation bilaterally.  No wheezes or rhonchi. HEART:  Regular rate and rhythm. No murmur appreciated. ABDOMEN:  Soft, nontender.  Positive, normoactive bowel sounds. No organomegaly palpated. MSK:  No focal spinal tenderness to palpation. Full range of motion bilaterally in the upper extremities. EXTREMITIES:  No peripheral edema.   SKIN:  Clear with no obvious rashes or skin changes. No nail dyscrasia. NEURO:  Nonfocal. Well oriented.  Appropriate affect.     LAB RESULTS:  CMP     Component Value Date/Time   NA 140 08/02/2018 1100   K 4.1 08/02/2018 1100   CL 104 08/02/2018 1100   CO2 27 08/02/2018 1100   GLUCOSE 94 08/02/2018 1100   BUN 15 08/02/2018 1100   CREATININE 0.76 08/02/2018 1100   CREATININE 0.76 05/15/2018 0827   CREATININE 0.78 01/15/2018 1033   CALCIUM 9.5 08/02/2018 1100   PROT 6.8 08/02/2018 1100   ALBUMIN 4.4 08/02/2018 1100   AST 12 (L) 08/02/2018 1100   AST 10 (L) 05/15/2018 0827   ALT 9 08/02/2018 1100   ALT 9 05/15/2018 0827   ALKPHOS 86 08/02/2018 1100   BILITOT 0.5 08/02/2018 1100   BILITOT 1.1 05/15/2018 0827   GFRNONAA >60 08/02/2018 1100   GFRNONAA >60 05/15/2018 0827   GFRNONAA 95 01/15/2018 1033   GFRAA >60 08/02/2018 1100   GFRAA >60 05/15/2018 0827   GFRAA 110 01/15/2018 1033    No results found for: TOTALPROTELP, ALBUMINELP, A1GS, A2GS, BETS, BETA2SER, GAMS, MSPIKE, SPEI  No results found for: KPAFRELGTCHN, LAMBDASER, KAPLAMBRATIO  Lab Results  Component Value Date   WBC 6.6 08/02/2018   NEUTROABS 5.2 08/02/2018   HGB 9.4 (L) 08/02/2018   HCT 28.5 (L) 08/02/2018   MCV 96.9 08/02/2018   PLT 211 08/02/2018    _0 @  No results found for: LABCA2  No components found for: ULAGTX646  No results for input(s): INR in the last 168 hours.  No results found for: LABCA2  No  results found for: OEH212  No results found for: YQM250  No results found for: IBB048  No results found for: CA2729  No components found for: HGQUANT  No results found for: CEA1 / No results found  for: CEA1   No results found for: AFPTUMOR  No results found for: [place]  No results found for: PSA1  Appointment on 08/02/2018  Component Date Value Ref Range Status  . Sodium 08/02/2018 140  135 - 145 mmol/L Final  . Potassium 08/02/2018 4.1  3.5 - 5.1 mmol/L Final  . Chloride 08/02/2018 104  98 - 111 mmol/L Final  . CO2 08/02/2018 27  22 - 32 mmol/L Final  . Glucose, Bld 08/02/2018 94  70 - 99 mg/dL Final  . BUN 08/02/2018 15  6 - 20 mg/dL Final  . Creatinine, Ser 08/02/2018 0.76  0.44 - 1.00 mg/dL Final  . Calcium 08/02/2018 9.5  8.9 - 10.3 mg/dL Final  . Total Protein 08/02/2018 6.8  6.5 - 8.1 g/dL Final  . Albumin 08/02/2018 4.4  3.5 - 5.0 g/dL Final  . AST 08/02/2018 12* 15 - 41 U/L Final  . ALT 08/02/2018 9  0 - 44 U/L Final  . Alkaline Phosphatase 08/02/2018 86  38 - 126 U/L Final  . Total Bilirubin 08/02/2018 0.5  0.3 - 1.2 mg/dL Final  . GFR calc non Af Amer 08/02/2018 >60  >60 mL/min Final  . GFR calc Af Amer 08/02/2018 >60  >60 mL/min Final  . Anion gap 08/02/2018 9  5 - 15 Final   Performed at [facility], [place] [address] [place], [place] [address]  . WBC 08/02/2018 6.6  4.0 - 10.5 K/uL Final  . RBC 08/02/2018 2.94* 3.87 - 5.11 MIL/uL Final  . Hemoglobin 08/02/2018 9.4* 12.0 - 15.0 g/dL Final  . HCT 08/02/2018 28.5* 36.0 - 46.0 % Final  . MCV 08/02/2018 96.9  80.0 - 100.0 fL Final  . MCH 08/02/2018 32.0  26.0 - 34.0 pg Final  . MCHC 08/02/2018 33.0  30.0 - 36.0 g/dL Final  . RDW 08/02/2018 17.5* 11.5 - 15.5 % Final  . Platelets 08/02/2018 211  150 - 400 K/uL Final  . nRBC 08/02/2018 0.0  0.0 - 0.2 % Final  . Neutrophils Relative % 08/02/2018 78  % Final  . Neutro Abs 08/02/2018 5.2  1.7 - 7.7 K/uL Final  . Lymphocytes Relative  08/02/2018 11  % Final  . Lymphs Abs 08/02/2018 0.7  0.7 - 4.0 K/uL Final  . Monocytes Relative 08/02/2018 8  % Final  . Monocytes Absolute 08/02/2018 0.5  0.1 - 1.0 K/uL Final  . Eosinophils Relative 08/02/2018 1  % Final  . Eosinophils Absolute 08/02/2018 0.1  0.0 - 0.5 K/uL Final  . Basophils Relative 08/02/2018 0  % Final  . Basophils Absolute 08/02/2018 0.0  0.0 - 0.1 K/uL Final  . Immature Granulocytes 08/02/2018 2  % Final  . Abs Immature Granulocytes 08/02/2018 0.11* 0.00 - 0.07 K/uL Final   Performed at [facility], [place] [address], [place], [place] [address]    (this displays the last labs from the last 3 days)  No results found for: TOTALPROTELP, ALBUMINELP, A1GS, A2GS, BETS, BETA2SER, GAMS, MSPIKE, SPEI (this displays SPEP labs)  No results found for: KPAFRELGTCHN, LAMBDASER, KAPLAMBRATIO (kappa/lambda light chains)  No results found for: HGBA, HGBA2QUANT, HGBFQUANT, HGBSQUAN (Hemoglobinopathy evaluation)   No results found for: LDH  Lab Results  Component Value Date   IRON 64 07/15/2018   TIBC 265 07/15/2018   IRONPCTSAT 24 07/15/2018   (Iron and TIBC)  Lab Results  Component Value Date   FERRITIN 92 07/15/2018    Urinalysis    Component Value  Date/Time   COLORURINE YELLOW 12/18/2016 [place] 12/18/2016 1607   LABSPEC 1.010 12/18/2016 1607   PHURINE 7.0 12/18/2016 1607   GLUCOSEU NEGATIVE 12/18/2016 1607   HGBUR NEGATIVE 12/18/2016 1607   HGBUR negative 01/13/2010 0940   BILIRUBINUR NEGATIVE 12/18/2016 1607   BILIRUBINUR neg 05/17/2015 1034   KETONESUR NEGATIVE 12/18/2016 1607   PROTEINUR NEGATIVE 12/18/2016 1607   UROBILINOGEN 0.2 05/17/2015 1034   UROBILINOGEN 0.2 01/13/2010 0940   NITRITE NEGATIVE 12/18/2016 1607   LEUKOCYTESUR NEGATIVE 12/18/2016 1607    STUDIES:  No results found.   ELIGIBLE FOR AVAILABLE RESEARCH PROTOCOL: Upbeat   ASSESSMENT: 43 y.o. [place] woman, status  post right breast upper outer quadrant and right axillary lymph node biopsy 05/03/2018 for a clinical T2 N2, stage IIIC invasive ductal carcinoma, triple negative, with an MIB-1 of 75%  (a) staging studies with CT scans of the chest abdomen and pelvis on 05/29/18 show no distant metastases; bone scan shows questionable focus of tracer accumulation on the proximal to mid right femoral diaphysis  (b) MRI breast on 05/31/18 shows additional non mass like enhancement throughout upper outer right breast that extends 5cm inferomedially from biopsy proven malignancy.    (1) genetics testing 06/06/2018--at [facility]--negative 11 gene panel   (2) neoadjuvant chemotherapy consisting of doxorubicin and cyclophosphamide in dose dense fashion x4 started 06/03/2018, to be  followed by paclitaxel and carboplatin weekly x12 starting 08/02/2018 (a) Echo on 05/23/2018 shows an EF of 60-65%.  (3) definitive surgery to follow (patient is planning on bilateral mastectomies)  (4) adjuvant radiation to follow surgery   PLAN: Darlene Grant is doing well today.  Her labs are stable.  She is going to undergo breast ultrasound since her breast feels heavier.  This will be on 08/08/2018.    She will proceed with chemotherapy today with Paclitaxel and Carboplatin.  She and I reviewed common adverse effects today.  Hopefully she will tolerate this one better than previous.  I sent Inez Catalina a message to see if she can get in touch with Maeryn to get her baseline arm measurements prior to surgery.    Leandra and I spent a good portion of the appointment today talking through her reconstruction and the radiation, the risks, etc. I suggested she go to second to nature to look at prosthetics to get an idea of how they will function and feel.    Darlene Grant will return weekly for labs and treatment.  She will see Dr. Jana Hakim next week as well to discuss how she is tolerating treatment. She knows to call for any questions or concerns prior to her  next appointment with [place].    A total of (30) minutes of face-to-face time was spent with this patient with greater than 50% of that time in counseling and care-coordination.   Wilber Bihari, NP  08/02/18 11:44 AM Medical Oncology and Hematology [facility] [address] [place], [place] [address] Tel. [phone]    Fax. [phone]

## 2018-08-02 NOTE — Telephone Encounter (Signed)
No los °

## 2018-08-05 ENCOUNTER — Other Ambulatory Visit: Payer: Self-pay | Admitting: Adult Health

## 2018-08-05 ENCOUNTER — Other Ambulatory Visit: Payer: Self-pay | Admitting: Oncology

## 2018-08-05 DIAGNOSIS — C50411 Malignant neoplasm of upper-outer quadrant of right female breast: Secondary | ICD-10-CM

## 2018-08-05 DIAGNOSIS — Z171 Estrogen receptor negative status [ER-]: Secondary | ICD-10-CM

## 2018-08-06 ENCOUNTER — Other Ambulatory Visit: Payer: Self-pay | Admitting: Oncology

## 2018-08-07 ENCOUNTER — Telehealth: Payer: Self-pay

## 2018-08-07 NOTE — Telephone Encounter (Signed)
Nurse returned patient's call.  Left voicemail for patient to return call.  Re: plan for inclement weather.

## 2018-08-08 ENCOUNTER — Other Ambulatory Visit: Payer: Self-pay | Admitting: Oncology

## 2018-08-08 ENCOUNTER — Ambulatory Visit
Admission: RE | Admit: 2018-08-08 | Discharge: 2018-08-08 | Disposition: A | Discharge status: Home / Self Care | Payer: BLUE CROSS/BLUE SHIELD | Source: Ambulatory Visit | Attending: Oncology | Admitting: Oncology

## 2018-08-08 DIAGNOSIS — C50411 Malignant neoplasm of upper-outer quadrant of right female breast: Secondary | ICD-10-CM

## 2018-08-08 DIAGNOSIS — N6311 Unspecified lump in the right breast, upper outer quadrant: Secondary | ICD-10-CM | POA: Diagnosis not present

## 2018-08-08 DIAGNOSIS — Z171 Estrogen receptor negative status [ER-]: Secondary | ICD-10-CM

## 2018-08-08 NOTE — Progress Notes (Signed)
[facility] [place]  [phone]) [phone] [phone]    ID: Darlene Grant DOB: 08-Jul-1975  MR#: [id number]  TIR#:[id number]  Patient Care Team: , Virgie Dad, MD as PCP - General (Oncology) Rolm Bookbinder, MD as Consulting Physician (General Surgery) , Virgie Dad, MD as Consulting Physician (Oncology) Gery Pray, MD as Consulting Physician (Radiation Oncology) Emily Filbert, MD as Consulting Physician (Obstetrics and Gynecology) Lavonna Monarch, MD as Consulting Physician (Dermatology) OTHER MD:   CHIEF COMPLAINT: Triple negative breast cancer  CURRENT TREATMENT: Neoadjuvant chemotherapy   HISTORY OF CURRENT ILLNESS: From the original intake note:  Darlene Grant had routine screening mammography on 04/19/2018 showing a possible abnormality in the right breast. She underwent unilateral right diagnostic mammography with tomography and right breast ultrasonography at [facility] on 05/02/2018 showing: Breast Density Category C: There was an oval circumscribed mass in the posterior aspect of the upper outer right breast.  On exam this measured 3.5 cm, was firm, irregular and centered on 10-11 o'clock 5 cm from the nipple.  By ultrasonography there was a 3.7 x 1.7 x 1.5 cm elongated, irregular, hypoechoic mass in the 10:30 o'clock position of the right breast, 5 cm from the nipple.  Ultrasound of the right axilla showed 7 abnormal right axillary lymph nodes highly suspicious for metastatic nodes.  Accordingly on 05/03/2018 she proceeded to biopsy of the right breast area in question and one suspicious lymph node. The pathology from this procedure showed ([id number]): Right Breast: invasive mammary carcinoma, grade 3, E-cadherin positive (and therefore ductal) lymphovascular invasion present. Lymph node: Invasive mammary carcinoma Prognostic indicators significant for: estrogen receptor, 0% negative and progesterone receptor, 0%  negative. Proliferation marker Ki67 at 75%. HER2 negative (1+).  The patient's subsequent history is as detailed below.   INTERVAL HISTORY: Darlene Grant returns today for follow-up and treatment of her triple negative breast cancer.  She continues on neoadjuvant chemotherapy consisting of weekly paclitaxel and carboplatin x12. Today is day 1 cycle 2. She states that this treatment is easier to recover from. She is eating more consistently and she is drinking more water and tea than before. She had a "gigantic" hot flash the night of chemo with diaphoresis, but that was a singular event; she has had no flashes since this one. She noticed that she isn't as cold as she used to be. She had some bony aches. There is no neuropathy and no mouth sores. She did not tolerate the benadryl well; she was extremely fatigued and felt like she couldn't keep up with her mind, which worries her since she drives herself here and back.   Since her last visit here, she underwent a right breast ultrasound on 08/08/2018 showing significant improvement in the appearance of RIGHT axillary adenopathy. Mass in the 11 o'clock is barely detectable sonographically and is no longer palpable.   REVIEW OF SYSTEMS: Darlene Grant said she had more bowel movements than normal, but that she has not had diarrhea. She has had issues with acid reflux; she had Pepcid in her IV, without relief. She I unable to take omeprazole because it makes her nauseous with vomiting. She states that her family is tolerating her diagnosis well. At the moment, she is leaning towards not having reconstructive surgery. She has returned to mass. The patient denies unusual headaches, visual changes, nausea, vomiting, or dizziness. There has been no unusual cough, phlegm production, or pleurisy. This been no change in bowel or bladder habits. The patient denies unexplained fatigue or unexplained  weight loss, bleeding, rash, or fever. A detailed review of systems was  otherwise noncontributory.   She had questions about whether or not her husband should get genetics testing due to some ovarian and breast cancer on his side for the sake of their children.    PAST MEDICAL HISTORY: Past Medical History:  Diagnosis Date  . Abnormal Pap smear    ASCUS 03/13/11  . Anemia   . Breast pain, left   . Cancer ([place])   . Candida vaginitis   . Complication of anesthesia    pt states that all narcotics make her angry and she would prefer to avoid them.  . Rectal itching   . Vaginal Pap smear, abnormal     PAST SURGICAL HISTORY: Past Surgical History:  Procedure Laterality Date  . BREAST BIOPSY Right 8/16  . CESAREAN SECTION N/A 11/21/2013   Procedure: CESAREAN SECTION;  Surgeon: Alwyn Pea, MD;  Location: [facility];  Service: Obstetrics;  Laterality: N/A;  . CESAREAN SECTION N/A 06/23/2016   Procedure: CESAREAN SECTION;  Surgeon: Emily Filbert, MD;  Location: [facility];  Service: Obstetrics;  Laterality: N/A;  . HAND SURGERY  04/2012   ligament repair  . PILONIDAL CYST EXCISION  2000  . PORTACATH PLACEMENT N/A 05/28/2018   Procedure: INSERTION PORT-A-CATH WITH ULTRASOUND;  Surgeon: Rolm Bookbinder, MD;  Location: [facility];  Service: General;  Laterality: N/A;  . WISDOM TOOTH EXTRACTION  AGE 29    FAMILY HISTORY: Family History  Problem Relation Age of Onset  . Cancer Mother 39       breast, premenopausal  . Breast cancer Mother   . Cancer Maternal Grandmother        skin, stomach  . Cancer Maternal Grandfather        lung   As of November 2019 her father is 53 years old with a history of Parkinson's. Patients' mother is 63 years old with a history of breast cancer, diagnosed age 61, treated with radical mastectomy. The patient has no brothers and two sisters. Besides the patient's mother, the patient has no other family medical history of breast or ovarian cancer. Her maternal grandmother had stomach cancer and her maternal  grandfather had lung cancer, --he was a smoker.    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  Her husband, [name], an Chief Financial Officer. She has two children, [name], age 5, and [name], age 10 months.  The patient is not a smoker. She does drink alcohol and drinks an estimated one to two drinks per week.     ADVANCED DIRECTIVES: [name], her husband, is her healthcare department   HEALTH MAINTENANCE: Social History   Tobacco Use  . Smoking status: Never Smoker  . Smokeless tobacco: Never Used  Substance Use Topics  . Alcohol use: Yes    Alcohol/week: 0.0 standard drinks    Comment: occassionally  . Drug use: No    Colonoscopy: no  PAP: 12/2017  Bone density: no   Allergies  Allergen Reactions  . Augmentin [Amoxicillin-Pot Clavulanate]     diarrhea  . Hydrocodone Itching  .  Keflex [Cephalexin] Diarrhea    Current Outpatient Medications  Medication Sig Dispense Refill  . ciprofloxacin (CIPRO) 500 MG tablet Take one tablet by mouth 2 times daily for 5 days after each chemo cycle, starting on day 8 of chemo 40 tablet 1  . cyclobenzaprine (FLEXERIL) 10 MG tablet Take 0.5-1 tablets (5-10 mg total) by mouth 3 (three) times daily as needed for muscle spasms. 30 tablet 1  . dexamethasone (DECADRON) 4 MG tablet Take 2 tablets by mouth once a day on the day after chemotherapy and then take 2 tablets two times a day for 2 days. Take with food. 30 tablet 1  . famotidine (PEPCID) 20 MG tablet Take 1 tablet (20 mg total) by mouth 2 (two) times daily.    Marland Kitchen lidocaine-prilocaine (EMLA) cream Apply to affected area once 30 g 3  . loratadine (CLARITIN) 10 MG tablet Take 10 mg by mouth daily as needed for allergies.    Marland Kitchen LORazepam  (ATIVAN) 0.5 MG tablet Take 1 tablet (0.5 mg total) by mouth at bedtime as needed (Nausea or vomiting). 30 tablet 0  . polyethylene glycol (MIRALAX) 0.34 gm/ml SOLN     . Probiotic Product (PROBIOTIC DAILY PO) Take 1 tablet by mouth.    . prochlorperazine (COMPAZINE) 10 MG tablet Take 1 tablet (10 mg total) by mouth every 6 (six) hours as needed (Nausea or vomiting). 30 tablet 1  . sucralfate (CARAFATE) 1 g tablet Take 1 tablet (1 g total) by mouth 4 (four) times daily -  with meals and at bedtime. 40 tablet 0   No current facility-administered medications for this visit.      OBJECTIVE: Young white woman in no acute distress Vitals:   08/09/18 1206  BP: 133/89  Pulse: 76  Resp: 18  Temp: 99.2 F (37.3 C)  SpO2: 100%     Body mass index is 23.9 kg/m.   Wt Readings from Last 3 Encounters:  08/09/18 140 lb 4.8 oz (63.6 kg)  08/02/18 140 lb 8 oz (63.7 kg)  07/23/18 141 lb 11.2 oz (64.3 kg)  ECOG FS:1  Sclerae unicteric, EOMs intact Oropharynx clear and moist No cervical or supraclavicular adenopathy Lungs no rales or rhonchi Heart regular rate and rhythm Abd soft, nontender, positive bowel sounds MSK no focal spinal tenderness, no upper extremity lymphedema Neuro: nonfocal, well oriented, appropriate affect Breasts: Deferred   LAB RESULTS:  CMP     Component Value Date/Time   NA 140 08/02/2018 1100   K 4.1 08/02/2018 1100   CL 104 08/02/2018 1100   CO2 27 08/02/2018 1100   GLUCOSE 94 08/02/2018 1100   BUN 15 08/02/2018 1100   CREATININE 0.76 08/02/2018 1100   CREATININE 0.76 05/15/2018 0827   CREATININE 0.78 01/15/2018 1033   CALCIUM 9.5 08/02/2018 1100   PROT 6.8 08/02/2018 1100   ALBUMIN 4.4 08/02/2018 1100   AST 12 (L) 08/02/2018 1100   AST 10 (L) 05/15/2018 0827   ALT 9 08/02/2018 1100   ALT 9 05/15/2018 0827   ALKPHOS 86 08/02/2018 1100   BILITOT 0.5 08/02/2018 1100   BILITOT 1.1 05/15/2018 0827   GFRNONAA >60 08/02/2018 1100   GFRNONAA >60 05/15/2018  0827   GFRNONAA 95 01/15/2018 1033   GFRAA >60 08/02/2018 1100   GFRAA >60 05/15/2018 0827   GFRAA 110 01/15/2018 1033    No results found for: TOTALPROTELP, ALBUMINELP, A1GS, A2GS, BETS, BETA2SER, GAMS, MSPIKE, SPEI  No results found for: KPAFRELGTCHN, LAMBDASER, KAPLAMBRATIO  Lab Results  Component Value Date   WBC 3.6 (L) 08/09/2018   NEUTROABS 2.5 08/09/2018   HGB 9.0 (L) 08/09/2018   HCT 26.5 (L) 08/09/2018   MCV 95.3 08/09/2018   PLT 252 08/09/2018    _0 @  No results found for: LABCA2  No components found for: OHYWVP710  No results for input(s): INR in the last 168 hours.  No results found for: LABCA2  No results found for: GYI948  No results found for: NIO270  No results found for: JJK093  No results found for: CA2729  No components found for: HGQUANT  No results found for: CEA1 / No results found for: CEA1   No results found for: AFPTUMOR  No results found for: CHROMOGRNA  No results found for: PSA1  Appointment on 08/09/2018  Component Date Value Ref Range Status  . WBC 08/09/2018 3.6* 4.0 - 10.5 K/uL Final  . RBC 08/09/2018 2.78* 3.87 - 5.11 MIL/uL Final  . Hemoglobin 08/09/2018 9.0* 12.0 - 15.0 g/dL Final  . HCT 08/09/2018 26.5* 36.0 - 46.0 % Final  . MCV 08/09/2018 95.3  80.0 - 100.0 fL Final  . MCH 08/09/2018 32.4  26.0 - 34.0 pg Final  . MCHC 08/09/2018 34.0  30.0 - 36.0 g/dL Final  . RDW 08/09/2018 16.2* 11.5 - 15.5 % Final  . Platelets 08/09/2018 252  150 - 400 K/uL Final  . nRBC 08/09/2018 0.0  0.0 - 0.2 % Final  . Neutrophils Relative % 08/09/2018 69  % Final  . Neutro Abs 08/09/2018 2.5  1.7 - 7.7 K/uL Final  . Lymphocytes Relative 08/09/2018 18  % Final  . Lymphs Abs 08/09/2018 0.7  0.7 - 4.0 K/uL Final  . Monocytes Relative 08/09/2018 10  % Final  . Monocytes Absolute 08/09/2018 0.4  0.1 - 1.0 K/uL Final  . Eosinophils Relative 08/09/2018 1  % Final  . Eosinophils Absolute 08/09/2018 0.0  0.0 - 0.5 K/uL Final  .  Basophils Relative 08/09/2018 1  % Final  . Basophils Absolute 08/09/2018 0.0  0.0 - 0.1 K/uL Final  . Immature Granulocytes 08/09/2018 1  % Final  . Abs Immature Granulocytes 08/09/2018 0.04  0.00 - 0.07 K/uL Final   Performed at [facility], [place] [address] [place], [place] [address]    (this displays the last labs from the last 3 days)  No results found for: TOTALPROTELP, ALBUMINELP, A1GS, A2GS, BETS, BETA2SER, GAMS, MSPIKE, SPEI (this displays SPEP labs)  No results found for: KPAFRELGTCHN, LAMBDASER, KAPLAMBRATIO (kappa/lambda light chains)  No results found for: HGBA, HGBA2QUANT, HGBFQUANT, HGBSQUAN (Hemoglobinopathy evaluation)   No results found for: LDH  Lab Results  Component Value Date   IRON 64 07/15/2018   TIBC 265 07/15/2018   IRONPCTSAT 24 07/15/2018   (Iron and TIBC)  Lab Results  Component Value Date   FERRITIN 92 07/15/2018    Urinalysis    Component Value Date/Time   COLORURINE YELLOW 12/18/2016 [place] 12/18/2016 1607   LABSPEC 1.010 12/18/2016 1607   PHURINE 7.0 12/18/2016 1607   GLUCOSEU NEGATIVE 12/18/2016 1607   HGBUR NEGATIVE 12/18/2016 1607   HGBUR negative 01/13/2010 0940   BILIRUBINUR NEGATIVE 12/18/2016 1607   BILIRUBINUR neg 05/17/2015 [place] 12/18/2016 1607   PROTEINUR NEGATIVE 12/18/2016 1607   UROBILINOGEN 0.2 05/17/2015 1034   UROBILINOGEN 0.2 01/13/2010 0940   NITRITE NEGATIVE 12/18/2016 [place] 12/18/2016 1607    STUDIES:  US Breast Ltd Uni Right  Inc Axilla  Result Date: 08/08/2018 CLINICAL DATA:  Patient returns for evaluation of the RIGHT breast. The patient is undergoing neoadjuvant chemotherapy for treatment of RIGHT breast cancer. The area of concern is a palpable mass in the UPPER OUTER QUADRANT of the RIGHT breast originally measuring 3.7 x 1.7 x 1.5 centimeters in the 10-11 o'clock location. EXAM: ULTRASOUND OF THE RIGHT BREAST  COMPARISON:  05/28/2018 and earlier FINDINGS: On physical exam, I palpate no abnormality in the UPPER portion of the RIGHT breast. Targeted ultrasound is performed, showing vague area of acoustic shadowing/distortion in the 11 o'clock location of the RIGHT breast 3 centimeters from the nipple, measuring 1.4 centimeters. No discrete mass is identified however. Evaluation of the RIGHT axilla shows lymph nodes with normal morphology. The most abnormal lymph node has a cortical thickening of 3 millimeters. IMPRESSION: Significant improvement in the appearance of RIGHT axillary adenopathy. Mass in the 11 o'clock is barely detectable sonographically and is no longer palpable. RECOMMENDATION: Treatment plan for known malignancy. I have discussed the findings and recommendations with the patient. Results were also provided in writing at the conclusion of the visit. If applicable, a reminder letter will be sent to the patient regarding the next appointment. BI-RADS CATEGORY  6: Known biopsy-proven malignancy. Electronically Signed   By: Nolon Nations M.D.   On: 08/08/2018 10:07     ELIGIBLE FOR AVAILABLE RESEARCH PROTOCOL: Upbeat   ASSESSMENT: 43 y.o. [facility] woman, status post right breast upper outer quadrant and right axillary lymph node biopsy 05/03/2018 for a clinical T2 N2, stage IIIC invasive ductal carcinoma, triple negative, with an MIB-1 of 75%  (a) staging studies with CT scans of the chest abdomen and pelvis on 05/29/18 show no distant metastases; bone scan shows questionable focus of tracer accumulation on the proximal to mid right femoral diaphysis  (b) MRI breast on 05/31/18 shows additional non mass like enhancement throughout upper outer right breast that extends 5cm inferomedially from biopsy proven malignancy.    (1) genetics testing 06/06/2018--at [facility]--negative 11 gene panel   (2) neoadjuvant chemotherapy consisting of doxorubicin and cyclophosphamide in dose dense  fashion x4 started 06/03/2018, to be  followed by paclitaxel and carboplatin weekly x12 starting 08/02/2018 (a) Echo on 05/23/2018 shows an EF of 60-65%.  (3) definitive surgery to follow (patient is planning on bilateral mastectomies)  (4) adjuvant radiation to follow surgery   PLAN: Deidre Ala is tolerating her chemotherapy well and the plan is to continue paclitaxel weekly x12 as planned  It is very encouraging that her ultrasound shows the tumor to be almost completely gone.  We are hoping for a complete pathologic response at the time of surgery.  At this point she is still considering bilateral mastectomies.  We reviewed again the fact that there is no survival advantage to bilateral mastectomies as compared to lumpectomy plus radiation  She had many questions including whether her husband should be tested genetically and I have passed that question onto our genetics counselors.  I have dropped her Benadryl dose as requested.  She is having reflux problems but does not tolerate omeprazole so she is taking Pepcid at bedtime  She will see [place] again in 2 weeks.  She knows to call for any other issue that may develop before the next visit.  , Virgie Dad, MD  08/09/18 12:38 PM Medical Oncology and Hematology [facility] [address] [place], [place] [address] Tel. [phone]    Fax. [phone]  I, [employer]  Handy am acting as a Education administrator for Chauncey Cruel, MD.   I, Lurline Del MD, have reviewed the above documentation for accuracy and completeness, and I agree with the above.

## 2018-08-09 ENCOUNTER — Inpatient Hospital Stay: Payer: BLUE CROSS/BLUE SHIELD

## 2018-08-09 ENCOUNTER — Inpatient Hospital Stay (HOSPITAL_BASED_OUTPATIENT_CLINIC_OR_DEPARTMENT_OTHER): Payer: BLUE CROSS/BLUE SHIELD | Admitting: Oncology

## 2018-08-09 ENCOUNTER — Ambulatory Visit: Payer: BLUE CROSS/BLUE SHIELD | Admitting: Oncology

## 2018-08-09 ENCOUNTER — Ambulatory Visit: Payer: BLUE CROSS/BLUE SHIELD | Admitting: Adult Health

## 2018-08-09 DIAGNOSIS — Z171 Estrogen receptor negative status [ER-]: Secondary | ICD-10-CM

## 2018-08-09 DIAGNOSIS — K219 Gastro-esophageal reflux disease without esophagitis: Secondary | ICD-10-CM | POA: Diagnosis not present

## 2018-08-09 DIAGNOSIS — Z5111 Encounter for antineoplastic chemotherapy: Secondary | ICD-10-CM | POA: Diagnosis not present

## 2018-08-09 DIAGNOSIS — C50411 Malignant neoplasm of upper-outer quadrant of right female breast: Secondary | ICD-10-CM

## 2018-08-09 DIAGNOSIS — Z95828 Presence of other vascular implants and grafts: Secondary | ICD-10-CM

## 2018-08-09 LAB — COMPREHENSIVE METABOLIC PANEL
ALT: 11 U/L (ref 0–44)
AST: 15 U/L (ref 15–41)
Albumin: 4.3 g/dL (ref 3.5–5.0)
Alkaline Phosphatase: 72 U/L (ref 38–126)
Anion gap: 11 (ref 5–15)
BILIRUBIN TOTAL: 0.6 mg/dL (ref 0.3–1.2)
BUN: 13 mg/dL (ref 6–20)
CO2: 24 mmol/L (ref 22–32)
Calcium: 9.3 mg/dL (ref 8.9–10.3)
Chloride: 103 mmol/L (ref 98–111)
Creatinine, Ser: 0.71 mg/dL (ref 0.44–1.00)
GFR calc Af Amer: >60 mL/min (ref >60)
GFR calc non Af Amer: >60 mL/min (ref >60)
Glucose, Bld: 120 mg/dL — ABNORMAL HIGH (ref 70–99)
Potassium: 4.1 mmol/L (ref 3.5–5.1)
Sodium: 138 mmol/L (ref 135–145)
Total Protein: 6.8 g/dL (ref 6.5–8.1)

## 2018-08-09 LAB — CBC WITH DIFFERENTIAL/PLATELET
ABS IMMATURE GRANULOCYTES: 0.04 10*3/uL (ref 0.00–0.07)
BASOS PCT: 1 %
Basophils Absolute: 0 10*3/uL (ref 0.0–0.1)
Eosinophils Absolute: 0 10*3/uL (ref 0.0–0.5)
Eosinophils Relative: 1 %
HCT: 26.5 % — ABNORMAL LOW (ref 36.0–46.0)
Hemoglobin: 9 g/dL — ABNORMAL LOW (ref 12.0–15.0)
Immature Granulocytes: 1 %
Lymphocytes Relative: 18 %
Lymphs Abs: 0.7 10*3/uL (ref 0.7–4.0)
MCH: 32.4 pg (ref 26.0–34.0)
MCHC: 34 g/dL (ref 30.0–36.0)
MCV: 95.3 fL (ref 80.0–100.0)
Monocytes Absolute: 0.4 10*3/uL (ref 0.1–1.0)
Monocytes Relative: 10 %
Neutro Abs: 2.5 10*3/uL (ref 1.7–7.7)
Neutrophils Relative %: 69 %
Platelets: 252 10*3/uL (ref 150–400)
RBC: 2.78 MIL/uL — ABNORMAL LOW (ref 3.87–5.11)
RDW: 16.2 % — ABNORMAL HIGH (ref 11.5–15.5)
WBC: 3.6 10*3/uL — ABNORMAL LOW (ref 4.0–10.5)
nRBC: 0 % (ref 0.0–0.2)

## 2018-08-09 MED ORDER — ACETAMINOPHEN 325 MG PO TABS
650.0000 mg | ORAL_TABLET | Freq: Once | ORAL | Status: AC
  Administered 2018-08-09: 650 mg via ORAL

## 2018-08-09 MED ORDER — SODIUM CHLORIDE 0.9% FLUSH
10.0000 mL | INTRAVENOUS | Status: DC | PRN
  Administered 2018-08-09: 10 mL
  Filled 2018-08-09: qty 10

## 2018-08-09 MED ORDER — DIPHENHYDRAMINE HCL 25 MG PO CAPS
12.5000 mg | ORAL_CAPSULE | Freq: Once | ORAL | Status: AC
  Administered 2018-08-09: 25 mg via ORAL

## 2018-08-09 MED ORDER — SODIUM CHLORIDE 0.9 % IV SOLN
243.0000 mg | Freq: Once | INTRAVENOUS | Status: AC
  Administered 2018-08-09: 240 mg via INTRAVENOUS
  Filled 2018-08-09: qty 24

## 2018-08-09 MED ORDER — SODIUM CHLORIDE 0.9 % IV SOLN
80.0000 mg/m2 | Freq: Once | INTRAVENOUS | Status: AC
  Administered 2018-08-09: 138 mg via INTRAVENOUS
  Filled 2018-08-09: qty 23

## 2018-08-09 MED ORDER — FAMOTIDINE IN NACL 20-0.9 MG/50ML-% IV SOLN: 20.0000 mg | Freq: Once | INTRAVENOUS | Status: DC

## 2018-08-09 MED ORDER — PALONOSETRON HCL INJECTION 0.25 MG/5ML
INTRAVENOUS | Status: AC
  Filled 2018-08-09: qty 5

## 2018-08-09 MED ORDER — DIPHENHYDRAMINE HCL 25 MG PO CAPS
ORAL_CAPSULE | ORAL | Status: AC
  Filled 2018-08-09: qty 1

## 2018-08-09 MED ORDER — SODIUM CHLORIDE 0.9 % IV SOLN
Freq: Once | INTRAVENOUS | Status: AC
  Administered 2018-08-09: 13:00:00 via INTRAVENOUS
  Filled 2018-08-09: qty 250

## 2018-08-09 MED ORDER — SODIUM CHLORIDE 0.9 % IV SOLN
20.0000 mg | Freq: Once | INTRAVENOUS | Status: AC
  Administered 2018-08-09: 20 mg via INTRAVENOUS
  Filled 2018-08-09: qty 2

## 2018-08-09 MED ORDER — ACETAMINOPHEN 325 MG PO TABS
ORAL_TABLET | ORAL | Status: AC
  Filled 2018-08-09: qty 2

## 2018-08-09 MED ORDER — SODIUM CHLORIDE 0.9 % IV SOLN
12.0000 mg | Freq: Once | INTRAVENOUS | Status: AC
  Administered 2018-08-09: 12 mg via INTRAVENOUS
  Filled 2018-08-09: qty 1.2

## 2018-08-09 MED ORDER — HEPARIN SOD (PORK) LOCK FLUSH 100 UNIT/ML IV SOLN
500.0000 [IU] | Freq: Once | INTRAVENOUS | Status: AC | PRN
  Administered 2018-08-09: 500 [IU]
  Filled 2018-08-09: qty 5

## 2018-08-09 MED ORDER — PALONOSETRON HCL INJECTION 0.25 MG/5ML
0.2500 mg | Freq: Once | INTRAVENOUS | Status: AC
  Administered 2018-08-09: 0.25 mg via INTRAVENOUS

## 2018-08-09 MED ORDER — FAMOTIDINE IN NACL 20-0.9 MG/50ML-% IV SOLN
INTRAVENOUS | Status: AC
  Filled 2018-08-09: qty 50

## 2018-08-09 MED ORDER — LORAZEPAM 0.5 MG PO TABS: 0.5000 mg | ORAL_TABLET | Freq: Every evening | ORAL | 0 refills | Status: DC | PRN

## 2018-08-09 NOTE — Patient Instructions (Signed)
[facility] Discharge Instructions for Patients Receiving Chemotherapy  Today you received the following chemotherapy agents Paclitaxel (TAXOL) & Carboplatin (PARAPLATIN).  To help prevent nausea and vomiting after your treatment, we encourage you to take your nausea medication as prescribed.   If you develop nausea and vomiting that is not controlled by your nausea medication, call the clinic.   BELOW ARE SYMPTOMS THAT SHOULD BE REPORTED IMMEDIATELY:  *FEVER GREATER THAN 100.5 F  *CHILLS WITH OR WITHOUT FEVER  NAUSEA AND VOMITING THAT IS NOT CONTROLLED WITH YOUR NAUSEA MEDICATION  *UNUSUAL SHORTNESS OF BREATH  *UNUSUAL BRUISING OR BLEEDING  TENDERNESS IN MOUTH AND THROAT WITH OR WITHOUT PRESENCE OF ULCERS  *URINARY PROBLEMS  *BOWEL PROBLEMS  UNUSUAL RASH Items with * indicate a potential emergency and should be followed up as soon as possible.  Feel free to call the clinic should you have any questions or concerns. The clinic phone number is [phone].  Please show the CHEMO ALERT CARD at check-in to the Emergency Department and triage nurse.   Paclitaxel injection What is this medicine? PACLITAXEL (PAK li TAX el) is a chemotherapy drug. It targets fast dividing cells, like cancer cells, and causes these cells to die. This medicine is used to treat ovarian cancer, breast cancer, lung cancer, Kaposi's sarcoma, and other cancers. This medicine may be used for other purposes; ask your health care provider or pharmacist if you have questions. COMMON BRAND NAME(S): Onxol, Taxol What should I tell my health care provider before I take this medicine? They need to know if you have any of these conditions: -history of irregular heartbeat -liver disease -low blood counts, like low white cell, platelet, or red cell counts -lung or breathing disease, like asthma -tingling of the fingers or toes, or other nerve disorder -an unusual or allergic reaction to  paclitaxel, alcohol, polyoxyethylated castor oil, other chemotherapy, other medicines, foods, dyes, or preservatives -pregnant or trying to get pregnant -breast-feeding How should I use this medicine? This drug is given as an infusion into a vein. It is administered in a hospital or clinic by a specially trained health care professional. Talk to your pediatrician regarding the use of this medicine in children. Special care may be needed. Overdosage: If you think you have taken too much of this medicine contact a poison control center or emergency room at once. NOTE: This medicine is only for you. Do not share this medicine with others. What if I miss a dose? It is important not to miss your dose. Call your doctor or health care professional if you are unable to keep an appointment. What may interact with this medicine? Do not take this medicine with any of the following medications: -disulfiram -metronidazole This medicine may also interact with the following medications: -antiviral medicines for hepatitis, HIV or AIDS -certain antibiotics like erythromycin and clarithromycin -certain medicines for fungal infections like ketoconazole and itraconazole -certain medicines for seizures like carbamazepine, phenobarbital, phenytoin -gemfibrozil -nefazodone -rifampin -St. John's wort This list may not describe all possible interactions. Give your health care provider a list of all the medicines, herbs, non-prescription drugs, or dietary supplements you use. Also tell them if you smoke, drink alcohol, or use illegal drugs. Some items may interact with your medicine. What should I watch for while using this medicine? Your condition will be monitored carefully while you are receiving this medicine. You will need important blood work done while you are taking this medicine. This medicine can cause serious allergic   reactions. To reduce your risk you will need to take other medicine(s) before treatment  with this medicine. If you experience allergic reactions like skin rash, itching or hives, swelling of the face, lips, or tongue, tell your doctor or health care professional right away. In some cases, you may be given additional medicines to help with side effects. Follow all directions for their use. This drug may make you feel generally unwell. This is not uncommon, as chemotherapy can affect healthy cells as well as cancer cells. Report any side effects. Continue your course of treatment even though you feel ill unless your doctor tells you to stop. Call your doctor or health care professional for advice if you get a fever, chills or sore throat, or other symptoms of a cold or flu. Do not treat yourself. This drug decreases your body's ability to fight infections. Try to avoid being around people who are sick. This medicine may increase your risk to bruise or bleed. Call your doctor or health care professional if you notice any unusual bleeding. Be careful brushing and flossing your teeth or using a toothpick because you may get an infection or bleed more easily. If you have any dental work done, tell your dentist you are receiving this medicine. Avoid taking products that contain aspirin, acetaminophen, ibuprofen, naproxen, or ketoprofen unless instructed by your doctor. These medicines may hide a fever. Do not become pregnant while taking this medicine. Women should inform their doctor if they wish to become pregnant or think they might be pregnant. There is a potential for serious side effects to an unborn child. Talk to your health care professional or pharmacist for more information. Do not breast-feed an infant while taking this medicine. Men are advised not to father a child while receiving this medicine. This product may contain alcohol. Ask your pharmacist or healthcare provider if this medicine contains alcohol. Be sure to tell all healthcare providers you are taking this medicine. Certain  medicines, like metronidazole and disulfiram, can cause an unpleasant reaction when taken with alcohol. The reaction includes flushing, headache, nausea, vomiting, sweating, and increased thirst. The reaction can last from 30 minutes to several hours. What side effects may I notice from receiving this medicine? Side effects that you should report to your doctor or health care professional as soon as possible: -allergic reactions like skin rash, itching or hives, swelling of the face, lips, or tongue -breathing problems -changes in vision -fast, irregular heartbeat -high or low blood pressure -mouth sores -pain, tingling, numbness in the hands or feet -signs of decreased platelets or bleeding - bruising, pinpoint red spots on the skin, black, tarry stools, blood in the urine -signs of decreased red blood cells - unusually weak or tired, feeling faint or lightheaded, falls -signs of infection - fever or chills, cough, sore throat, pain or difficulty passing urine -signs and symptoms of liver injury like dark yellow or brown urine; general ill feeling or flu-like symptoms; light-colored stools; loss of appetite; nausea; right upper belly pain; unusually weak or tired; yellowing of the eyes or skin -swelling of the ankles, feet, hands -unusually slow heartbeat Side effects that usually do not require medical attention (report to your doctor or health care professional if they continue or are bothersome): -diarrhea -hair loss -loss of appetite -muscle or joint pain -nausea, vomiting -pain, redness, or irritation at site where injected -tiredness This list may not describe all possible side effects. Call your doctor for medical advice about side effects. You may   report side effects to FDA at 1-800-FDA-1088. Where should I keep my medicine? This drug is given in a hospital or clinic and will not be stored at home. NOTE: This sheet is a summary. It may not cover all possible information. If you  have questions about this medicine, talk to your doctor, pharmacist, or health care provider.  2019 [employer] Standard (2017-02-06 13:14:55)   Carboplatin injection What is this medicine? CARBOPLATIN (KAR boe pla tin) is a chemotherapy drug. It targets fast dividing cells, like cancer cells, and causes these cells to die. This medicine is used to treat ovarian cancer and many other cancers. This medicine may be used for other purposes; ask your health care provider or pharmacist if you have questions. COMMON BRAND NAME(S): Paraplatin What should I tell my health care provider before I take this medicine? They need to know if you have any of these conditions: -blood disorders -hearing problems -kidney disease -recent or ongoing radiation therapy -an unusual or allergic reaction to carboplatin, cisplatin, other chemotherapy, other medicines, foods, dyes, or preservatives -pregnant or trying to get pregnant -breast-feeding How should I use this medicine? This drug is usually given as an infusion into a vein. It is administered in a hospital or clinic by a specially trained health care professional. Talk to your pediatrician regarding the use of this medicine in children. Special care may be needed. Overdosage: If you think you have taken too much of this medicine contact a poison control center or emergency room at once. NOTE: This medicine is only for you. Do not share this medicine with others. What if I miss a dose? It is important not to miss a dose. Call your doctor or health care professional if you are unable to keep an appointment. What may interact with this medicine? -medicines for seizures -medicines to increase blood counts like filgrastim, pegfilgrastim, sargramostim -some antibiotics like amikacin, gentamicin, neomycin, streptomycin, tobramycin -vaccines Talk to your doctor or health care professional before taking any of these  medicines: -acetaminophen -aspirin -ibuprofen -ketoprofen -naproxen This list may not describe all possible interactions. Give your health care provider a list of all the medicines, herbs, non-prescription drugs, or dietary supplements you use. Also tell them if you smoke, drink alcohol, or use illegal drugs. Some items may interact with your medicine. What should I watch for while using this medicine? Your condition will be monitored carefully while you are receiving this medicine. You will need important blood work done while you are taking this medicine. This drug may make you feel generally unwell. This is not uncommon, as chemotherapy can affect healthy cells as well as cancer cells. Report any side effects. Continue your course of treatment even though you feel ill unless your doctor tells you to stop. In some cases, you may be given additional medicines to help with side effects. Follow all directions for their use. Call your doctor or health care professional for advice if you get a fever, chills or sore throat, or other symptoms of a cold or flu. Do not treat yourself. This drug decreases your body's ability to fight infections. Try to avoid being around people who are sick. This medicine may increase your risk to bruise or bleed. Call your doctor or health care professional if you notice any unusual bleeding. Be careful brushing and flossing your teeth or using a toothpick because you may get an infection or bleed more easily. If you have any dental work done, tell your dentist you are receiving this   medicine. Avoid taking products that contain aspirin, acetaminophen, ibuprofen, naproxen, or ketoprofen unless instructed by your doctor. These medicines may hide a fever. Do not become pregnant while taking this medicine. Women should inform their doctor if they wish to become pregnant or think they might be pregnant. There is a potential for serious side effects to an unborn child. Talk to  your health care professional or pharmacist for more information. Do not breast-feed an infant while taking this medicine. What side effects may I notice from receiving this medicine? Side effects that you should report to your doctor or health care professional as soon as possible: -allergic reactions like skin rash, itching or hives, swelling of the face, lips, or tongue -signs of infection - fever or chills, cough, sore throat, pain or difficulty passing urine -signs of decreased platelets or bleeding - bruising, pinpoint red spots on the skin, black, tarry stools, nosebleeds -signs of decreased red blood cells - unusually weak or tired, fainting spells, lightheadedness -breathing problems -changes in hearing -changes in vision -chest pain -high blood pressure -low blood counts - This drug may decrease the number of white blood cells, red blood cells and platelets. You may be at increased risk for infections and bleeding. -nausea and vomiting -pain, swelling, redness or irritation at the injection site -pain, tingling, numbness in the hands or feet -problems with balance, talking, walking -trouble passing urine or change in the amount of urine Side effects that usually do not require medical attention (report to your doctor or health care professional if they continue or are bothersome): -hair loss -loss of appetite -metallic taste in the mouth or changes in taste This list may not describe all possible side effects. Call your doctor for medical advice about side effects. You may report side effects to FDA at 1-800-FDA-1088. Where should I keep my medicine? This drug is given in a hospital or clinic and will not be stored at home. NOTE: This sheet is a summary. It may not cover all possible information. If you have questions about this medicine, talk to your doctor, pharmacist, or health care provider.  2019 [employer] Standard (2007-09-10 14:38:05)    

## 2018-08-12 ENCOUNTER — Telehealth: Payer: Self-pay | Admitting: Oncology

## 2018-08-12 NOTE — Telephone Encounter (Signed)
No los °

## 2018-08-16 ENCOUNTER — Inpatient Hospital Stay: Payer: BLUE CROSS/BLUE SHIELD

## 2018-08-16 VITALS — BP 114/76 | HR 82 | Temp 98.5°F | Resp 18

## 2018-08-16 DIAGNOSIS — Z171 Estrogen receptor negative status [ER-]: Secondary | ICD-10-CM

## 2018-08-16 DIAGNOSIS — C50411 Malignant neoplasm of upper-outer quadrant of right female breast: Secondary | ICD-10-CM | POA: Diagnosis not present

## 2018-08-16 DIAGNOSIS — Z5111 Encounter for antineoplastic chemotherapy: Secondary | ICD-10-CM | POA: Diagnosis not present

## 2018-08-16 DIAGNOSIS — K219 Gastro-esophageal reflux disease without esophagitis: Secondary | ICD-10-CM | POA: Diagnosis not present

## 2018-08-16 DIAGNOSIS — Z95828 Presence of other vascular implants and grafts: Secondary | ICD-10-CM

## 2018-08-16 LAB — CBC WITH DIFFERENTIAL/PLATELET
Abs Immature Granulocytes: 0.01 10*3/uL (ref 0.00–0.07)
Basophils Absolute: 0 10*3/uL (ref 0.0–0.1)
Basophils Relative: 0 %
Eosinophils Absolute: 0 10*3/uL (ref 0.0–0.5)
Eosinophils Relative: 0 %
HCT: 26.5 % — ABNORMAL LOW (ref 36.0–46.0)
Hemoglobin: 8.8 g/dL — ABNORMAL LOW (ref 12.0–15.0)
Immature Granulocytes: 0 %
Lymphocytes Relative: 22 %
Lymphs Abs: 0.6 10*3/uL — ABNORMAL LOW (ref 0.7–4.0)
MCH: 32.5 pg (ref 26.0–34.0)
MCHC: 33.2 g/dL (ref 30.0–36.0)
MCV: 97.8 fL (ref 80.0–100.0)
Monocytes Absolute: 0.3 10*3/uL (ref 0.1–1.0)
Monocytes Relative: 13 %
NRBC: 0 % (ref 0.0–0.2)
Neutro Abs: 1.8 10*3/uL (ref 1.7–7.7)
Neutrophils Relative %: 65 %
Platelets: 202 10*3/uL (ref 150–400)
RBC: 2.71 MIL/uL — AB (ref 3.87–5.11)
RDW: 16.4 % — ABNORMAL HIGH (ref 11.5–15.5)
WBC: 2.7 10*3/uL — AB (ref 4.0–10.5)

## 2018-08-16 LAB — COMPREHENSIVE METABOLIC PANEL
ALBUMIN: 4.1 g/dL (ref 3.5–5.0)
ALT: 10 U/L (ref 0–44)
AST: 15 U/L (ref 15–41)
Alkaline Phosphatase: 67 U/L (ref 38–126)
Anion gap: 9 (ref 5–15)
BUN: 13 mg/dL (ref 6–20)
CO2: 25 mmol/L (ref 22–32)
Calcium: 9.2 mg/dL (ref 8.9–10.3)
Chloride: 105 mmol/L (ref 98–111)
Creatinine, Ser: 0.7 mg/dL (ref 0.44–1.00)
GFR calc Af Amer: >60 mL/min (ref >60)
GFR calc non Af Amer: >60 mL/min (ref >60)
Glucose, Bld: 91 mg/dL (ref 70–99)
Potassium: 4.3 mmol/L (ref 3.5–5.1)
Sodium: 139 mmol/L (ref 135–145)
Total Bilirubin: 0.7 mg/dL (ref 0.3–1.2)
Total Protein: 6.5 g/dL (ref 6.5–8.1)

## 2018-08-16 MED ORDER — SODIUM CHLORIDE 0.9% FLUSH
10.0000 mL | INTRAVENOUS | Status: DC | PRN
  Administered 2018-08-16: 10 mL
  Filled 2018-08-16: qty 10

## 2018-08-16 MED ORDER — SODIUM CHLORIDE 0.9 % IV SOLN
12.0000 mg | Freq: Once | INTRAVENOUS | Status: AC
  Administered 2018-08-16: 12 mg via INTRAVENOUS
  Filled 2018-08-16: qty 1.2

## 2018-08-16 MED ORDER — PALONOSETRON HCL INJECTION 0.25 MG/5ML
0.2500 mg | Freq: Once | INTRAVENOUS | Status: AC
  Administered 2018-08-16: 0.25 mg via INTRAVENOUS

## 2018-08-16 MED ORDER — ACETAMINOPHEN 325 MG PO TABS
ORAL_TABLET | ORAL | Status: AC
  Filled 2018-08-16: qty 2

## 2018-08-16 MED ORDER — DIPHENHYDRAMINE HCL 12.5 MG/5ML PO ELIX
12.5000 mg | ORAL_SOLUTION | Freq: Once | ORAL | Status: AC
  Administered 2018-08-16: 12.5 mg via ORAL

## 2018-08-16 MED ORDER — SODIUM CHLORIDE 0.9 % IV SOLN
80.0000 mg/m2 | Freq: Once | INTRAVENOUS | Status: AC
  Administered 2018-08-16: 138 mg via INTRAVENOUS
  Filled 2018-08-16: qty 23

## 2018-08-16 MED ORDER — FAMOTIDINE IN NACL 20-0.9 MG/50ML-% IV SOLN: 20.0000 mg | Freq: Once | INTRAVENOUS | Status: DC

## 2018-08-16 MED ORDER — PALONOSETRON HCL INJECTION 0.25 MG/5ML
INTRAVENOUS | Status: AC
  Filled 2018-08-16: qty 5

## 2018-08-16 MED ORDER — SODIUM CHLORIDE 0.9 % IV SOLN
20.0000 mg | Freq: Once | INTRAVENOUS | Status: AC
  Administered 2018-08-16: 20 mg via INTRAVENOUS
  Filled 2018-08-16: qty 2

## 2018-08-16 MED ORDER — ACETAMINOPHEN 325 MG PO TABS
650.0000 mg | ORAL_TABLET | Freq: Once | ORAL | Status: AC
  Administered 2018-08-16: 650 mg via ORAL

## 2018-08-16 MED ORDER — DIPHENHYDRAMINE HCL 12.5 MG/5ML PO ELIX
ORAL_SOLUTION | ORAL | Status: AC
  Filled 2018-08-16: qty 5

## 2018-08-16 MED ORDER — HEPARIN SOD (PORK) LOCK FLUSH 100 UNIT/ML IV SOLN
500.0000 [IU] | Freq: Once | INTRAVENOUS | Status: AC | PRN
  Administered 2018-08-16: 500 [IU]
  Filled 2018-08-16: qty 5

## 2018-08-16 MED ORDER — SODIUM CHLORIDE 0.9 % IV SOLN
Freq: Once | INTRAVENOUS | Status: AC
  Administered 2018-08-16: 13:00:00 via INTRAVENOUS
  Filled 2018-08-16: qty 250

## 2018-08-16 MED ORDER — SODIUM CHLORIDE 0.9 % IV SOLN
243.0000 mg | Freq: Once | INTRAVENOUS | Status: AC
  Administered 2018-08-16: 240 mg via INTRAVENOUS
  Filled 2018-08-16: qty 24

## 2018-08-16 NOTE — Patient Instructions (Signed)
[facility] Discharge Instructions for Patients Receiving Chemotherapy  Today you received the following chemotherapy agents Paclitaxel (TAXOL) & Carboplatin (PARAPLATIN).  To help prevent nausea and vomiting after your treatment, we encourage you to take your nausea medication as prescribed.  If you develop nausea and vomiting that is not controlled by your nausea medication, call the clinic.   BELOW ARE SYMPTOMS THAT SHOULD BE REPORTED IMMEDIATELY:  *FEVER GREATER THAN 100.5 F  *CHILLS WITH OR WITHOUT FEVER  NAUSEA AND VOMITING THAT IS NOT CONTROLLED WITH YOUR NAUSEA MEDICATION  *UNUSUAL SHORTNESS OF BREATH  *UNUSUAL BRUISING OR BLEEDING  TENDERNESS IN MOUTH AND THROAT WITH OR WITHOUT PRESENCE OF ULCERS  *URINARY PROBLEMS  *BOWEL PROBLEMS  UNUSUAL RASH Items with * indicate a potential emergency and should be followed up as soon as possible.  Feel free to call the clinic should you have any questions or concerns. The clinic phone number is [phone].  Please show the CHEMO ALERT CARD at check-in to the Emergency Department and triage nurse.   

## 2018-08-23 ENCOUNTER — Inpatient Hospital Stay: Payer: BLUE CROSS/BLUE SHIELD | Attending: Oncology

## 2018-08-23 ENCOUNTER — Inpatient Hospital Stay (HOSPITAL_BASED_OUTPATIENT_CLINIC_OR_DEPARTMENT_OTHER): Payer: BLUE CROSS/BLUE SHIELD | Admitting: Adult Health

## 2018-08-23 ENCOUNTER — Inpatient Hospital Stay: Payer: BLUE CROSS/BLUE SHIELD

## 2018-08-23 ENCOUNTER — Encounter: Payer: Self-pay | Admitting: Adult Health

## 2018-08-23 VITALS — BP 106/72 | HR 71 | Temp 98.9°F | Resp 18 | Ht 64.25 in | Wt 142.5 lb

## 2018-08-23 DIAGNOSIS — C50411 Malignant neoplasm of upper-outer quadrant of right female breast: Secondary | ICD-10-CM

## 2018-08-23 DIAGNOSIS — Z5111 Encounter for antineoplastic chemotherapy: Secondary | ICD-10-CM | POA: Insufficient documentation

## 2018-08-23 DIAGNOSIS — C773 Secondary and unspecified malignant neoplasm of axilla and upper limb lymph nodes: Secondary | ICD-10-CM | POA: Diagnosis not present

## 2018-08-23 DIAGNOSIS — Z5189 Encounter for other specified aftercare: Secondary | ICD-10-CM | POA: Diagnosis not present

## 2018-08-23 DIAGNOSIS — Z171 Estrogen receptor negative status [ER-]: Secondary | ICD-10-CM

## 2018-08-23 DIAGNOSIS — Z95828 Presence of other vascular implants and grafts: Secondary | ICD-10-CM

## 2018-08-23 LAB — COMPREHENSIVE METABOLIC PANEL
ALT: 15 U/L (ref 0–44)
AST: 16 U/L (ref 15–41)
Albumin: 4.3 g/dL (ref 3.5–5.0)
Alkaline Phosphatase: 61 U/L (ref 38–126)
Anion gap: 4 — ABNORMAL LOW (ref 5–15)
BUN: 13 mg/dL (ref 6–20)
CO2: 28 mmol/L (ref 22–32)
Calcium: 9.2 mg/dL (ref 8.9–10.3)
Chloride: 105 mmol/L (ref 98–111)
Creatinine, Ser: 0.62 mg/dL (ref 0.44–1.00)
GFR calc Af Amer: >60 mL/min (ref >60)
GFR calc non Af Amer: >60 mL/min (ref >60)
Glucose, Bld: 104 mg/dL — ABNORMAL HIGH (ref 70–99)
POTASSIUM: 4.2 mmol/L (ref 3.5–5.1)
SODIUM: 137 mmol/L (ref 135–145)
Total Bilirubin: 0.9 mg/dL (ref 0.3–1.2)
Total Protein: 6.7 g/dL (ref 6.5–8.1)

## 2018-08-23 LAB — CBC WITH DIFFERENTIAL/PLATELET
Abs Immature Granulocytes: 0.02 10*3/uL (ref 0.00–0.07)
BASOS ABS: 0 10*3/uL (ref 0.0–0.1)
Basophils Relative: 0 %
Eosinophils Absolute: 0 10*3/uL (ref 0.0–0.5)
Eosinophils Relative: 1 %
HCT: 26.7 % — ABNORMAL LOW (ref 36.0–46.0)
Hemoglobin: 9 g/dL — ABNORMAL LOW (ref 12.0–15.0)
Immature Granulocytes: 1 %
Lymphocytes Relative: 23 %
Lymphs Abs: 0.6 10*3/uL — ABNORMAL LOW (ref 0.7–4.0)
MCH: 33.5 pg (ref 26.0–34.0)
MCHC: 33.7 g/dL (ref 30.0–36.0)
MCV: 99.3 fL (ref 80.0–100.0)
Monocytes Absolute: 0.3 10*3/uL (ref 0.1–1.0)
Monocytes Relative: 11 %
Neutro Abs: 1.7 10*3/uL (ref 1.7–7.7)
Neutrophils Relative %: 64 %
Platelets: 189 10*3/uL (ref 150–400)
RBC: 2.69 MIL/uL — ABNORMAL LOW (ref 3.87–5.11)
RDW: 15.9 % — ABNORMAL HIGH (ref 11.5–15.5)
WBC: 2.7 10*3/uL — ABNORMAL LOW (ref 4.0–10.5)
nRBC: 0 % (ref 0.0–0.2)

## 2018-08-23 MED ORDER — ACETAMINOPHEN 325 MG PO TABS
ORAL_TABLET | ORAL | Status: AC
  Filled 2018-08-23: qty 2

## 2018-08-23 MED ORDER — DIPHENHYDRAMINE HCL 12.5 MG/5ML PO ELIX
12.5000 mg | ORAL_SOLUTION | Freq: Once | ORAL | Status: AC
  Administered 2018-08-23: 12.5 mg via ORAL

## 2018-08-23 MED ORDER — PALONOSETRON HCL INJECTION 0.25 MG/5ML
0.2500 mg | Freq: Once | INTRAVENOUS | Status: AC
  Administered 2018-08-23: 0.25 mg via INTRAVENOUS

## 2018-08-23 MED ORDER — SODIUM CHLORIDE 0.9% FLUSH
10.0000 mL | INTRAVENOUS | Status: DC | PRN
  Administered 2018-08-23: 10 mL
  Filled 2018-08-23: qty 10

## 2018-08-23 MED ORDER — HEPARIN SOD (PORK) LOCK FLUSH 100 UNIT/ML IV SOLN
500.0000 [IU] | Freq: Once | INTRAVENOUS | Status: AC | PRN
  Administered 2018-08-23: 500 [IU]
  Filled 2018-08-23: qty 5

## 2018-08-23 MED ORDER — FAMOTIDINE IN NACL 20-0.9 MG/50ML-% IV SOLN: 20.0000 mg | Freq: Once | INTRAVENOUS | Status: DC

## 2018-08-23 MED ORDER — SODIUM CHLORIDE 0.9 % IV SOLN
20.0000 mg | Freq: Once | INTRAVENOUS | Status: AC
  Administered 2018-08-23: 20 mg via INTRAVENOUS
  Filled 2018-08-23: qty 2

## 2018-08-23 MED ORDER — SODIUM CHLORIDE 0.9 % IV SOLN
12.0000 mg | Freq: Once | INTRAVENOUS | Status: AC
  Administered 2018-08-23: 12 mg via INTRAVENOUS
  Filled 2018-08-23: qty 1.2

## 2018-08-23 MED ORDER — SODIUM CHLORIDE 0.9 % IV SOLN
243.0000 mg | Freq: Once | INTRAVENOUS | Status: AC
  Administered 2018-08-23: 240 mg via INTRAVENOUS
  Filled 2018-08-23: qty 24

## 2018-08-23 MED ORDER — PALONOSETRON HCL INJECTION 0.25 MG/5ML
INTRAVENOUS | Status: AC
  Filled 2018-08-23: qty 5

## 2018-08-23 MED ORDER — DIPHENHYDRAMINE HCL 12.5 MG/5ML PO ELIX
ORAL_SOLUTION | ORAL | Status: AC
  Filled 2018-08-23: qty 5

## 2018-08-23 MED ORDER — SODIUM CHLORIDE 0.9 % IV SOLN
Freq: Once | INTRAVENOUS | Status: AC
  Administered 2018-08-23: 12:00:00 via INTRAVENOUS
  Filled 2018-08-23: qty 250

## 2018-08-23 MED ORDER — SODIUM CHLORIDE 0.9 % IV SOLN
80.0000 mg/m2 | Freq: Once | INTRAVENOUS | Status: AC
  Administered 2018-08-23: 138 mg via INTRAVENOUS
  Filled 2018-08-23: qty 23

## 2018-08-23 MED ORDER — ACETAMINOPHEN 325 MG PO TABS
650.0000 mg | ORAL_TABLET | Freq: Once | ORAL | Status: AC
  Administered 2018-08-23: 650 mg via ORAL

## 2018-08-23 NOTE — Progress Notes (Signed)
[place]  [phone] [phone]    ID: Darlene Grant DOB: Mar 18, 1976  MR#: [id number]  ELF#:[id number]  Patient Care Team: Magrinat, Virgie Dad, MD as PCP - General (Oncology) Rolm Bookbinder, MD as Consulting Physician (General Surgery) Magrinat, Virgie Dad, MD as Consulting Physician (Oncology) Gery Pray, MD as Consulting Physician (Radiation Oncology) Emily Filbert, MD as Consulting Physician (Obstetrics and Gynecology) Lavonna Monarch, MD as Consulting Physician (Dermatology) OTHER MD:   CHIEF COMPLAINT: Triple negative breast cancer  CURRENT TREATMENT: Neoadjuvant chemotherapy   HISTORY OF CURRENT ILLNESS: From the original intake note:  Darlene Grant had routine screening mammography on 04/19/2018 showing a possible abnormality in the right breast. She underwent unilateral right diagnostic mammography with tomography and right breast ultrasonography at [facility] on 05/02/2018 showing: Breast Density Category C: There was an oval circumscribed mass in the posterior aspect of the upper outer right breast.  On exam this measured 3.5 cm, was firm, irregular and centered on 10-11 o'clock 5 cm from the nipple.  By ultrasonography there was a 3.7 x 1.7 x 1.5 cm elongated, irregular, hypoechoic mass in the 10:30 o'clock position of the right breast, 5 cm from the nipple.  Ultrasound of the right axilla showed 7 abnormal right axillary lymph nodes highly suspicious for metastatic nodes.  Accordingly on 05/03/2018 she proceeded to biopsy of the right breast area in question and one suspicious lymph node. The pathology from this procedure showed ([id number]): Right Breast: invasive mammary carcinoma, grade 3, E-cadherin positive (and therefore ductal) lymphovascular invasion present. Lymph node: Invasive mammary carcinoma Prognostic indicators significant for: estrogen receptor, 0% negative and progesterone receptor, 0%  negative. Proliferation marker Ki67 at 75%. HER2 negative (1+).  The patient's subsequent history is as detailed below.   INTERVAL HISTORY: Darlene Grant returns today for follow-up and treatment of her triple negative breast cancer.  She continues on neoadjuvant chemotherapy consisting of weekly paclitaxel and carboplatin x12. Today is day 1 cycle 4. She is doing well today.  REVIEW OF SYSTEMS: Darlene Grant notes that her sense of taste is going.  She denies any peripheral neuropathy and continues to ice her fingertips and toes with every treatment.  Darlene Grant is exhausted.  She says that she is not cooking dinner as much as she used to do.  She isn't able to exercise as much as she had been, which is distressing because this is something she enjoys.  She sometimes cannot sleep, which is challenging.    Darlene Grant denies any other issues.  She is without fevers or chills.  She denies nausea, vomiting, bowel or bladder changes.  She is without cough, chest pain, palpitations, or shortness of breath.  A detailed ROS was otherwise non contributory.     PAST MEDICAL HISTORY: Past Medical History:  Diagnosis Date  . Abnormal Pap smear    ASCUS 03/13/11  . Anemia   . Breast pain, left   . Cancer ([place])   . Candida vaginitis   . Complication of anesthesia    pt states that all narcotics make her angry and she would prefer to avoid them.  . Rectal itching   . Vaginal Pap smear, abnormal     PAST SURGICAL HISTORY: Past Surgical History:  Procedure Laterality Date  . BREAST BIOPSY Right 8/16  . CESAREAN SECTION N/A 11/21/2013   Procedure: CESAREAN SECTION;  Surgeon: Alwyn Pea, MD;  Location: [facility];  Service: Obstetrics;  Laterality: N/A;  . CESAREAN SECTION N/A 06/23/2016  Procedure: CESAREAN SECTION;  Surgeon: Emily Filbert, MD;  Location: [facility];  Service: Obstetrics;  Laterality: N/A;  . HAND SURGERY  04/2012   ligament repair  . PILONIDAL CYST EXCISION  2000  . PORTACATH PLACEMENT  N/A 05/28/2018   Procedure: INSERTION PORT-A-CATH WITH ULTRASOUND;  Surgeon: Rolm Bookbinder, MD;  Location: [facility];  Service: General;  Laterality: N/A;  . WISDOM TOOTH EXTRACTION  AGE 59    FAMILY HISTORY: Family History  Problem Relation Age of Onset  . Cancer Mother 64       breast, premenopausal  . Breast cancer Mother   . Cancer Maternal Grandmother        skin, stomach  . Cancer Maternal Grandfather        lung   As of November 2019 her father is 10 years old with a history of Parkinson's. Patients' mother is 74 years old with a history of breast cancer, diagnosed age 56, treated with radical mastectomy. The patient has no brothers and two sisters. Besides the patient's mother, the patient has no other family medical history of breast or ovarian cancer. Her maternal grandmother had stomach cancer and her maternal grandfather had lung cancer, --he was a smoker.    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  Her husband, Darlene Grant, an Chief Financial Officer. She has two children, Darlene Grant, age 72, and Darlene Grant, age 25 months.  The patient is not a smoker. She does drink alcohol and drinks an estimated one to two drinks per week.     ADVANCED DIRECTIVES: Darlene Grant, her husband, is her healthcare department   HEALTH MAINTENANCE: Social History   Tobacco Use  . Smoking status: Never Smoker  . Smokeless tobacco: Never Used  Substance Use Topics  . Alcohol use: Yes    Alcohol/week: 0.0 standard drinks    Comment: occassionally  . Drug use: No    Colonoscopy: no  PAP: 12/2017  Bone density: no   Allergies  Allergen Reactions  . Augmentin [Amoxicillin-Pot Clavulanate]     diarrhea  . Hydrocodone Itching  .  Keflex [Cephalexin] Diarrhea    Current Outpatient Medications  Medication Sig Dispense Refill  . ciprofloxacin (CIPRO) 500 MG tablet Take one tablet by mouth 2 times daily for 5 days after each chemo cycle, starting on day 8 of chemo 40 tablet 1  . cyclobenzaprine (FLEXERIL) 10 MG tablet Take 0.5-1 tablets (5-10 mg total) by mouth 3 (three) times daily as needed for muscle spasms. 30 tablet 1  . dexamethasone (DECADRON) 4 MG tablet Take 2 tablets by mouth once a day on the day after chemotherapy and then take 2 tablets two times a day for 2 days. Take with food. 30 tablet 1  . famotidine (PEPCID) 20 MG tablet Take 1 tablet (20 mg total) by mouth 2 (two) times daily.    Marland Kitchen lidocaine-prilocaine (EMLA) cream Apply to affected area once 30 g 3  . loratadine (CLARITIN) 10 MG tablet Take 10 mg by mouth daily as needed for allergies.    Marland Kitchen LORazepam (ATIVAN) 0.5 MG tablet Take 1 tablet (0.5 mg total)  by mouth at bedtime as needed (Nausea or vomiting). 30 tablet 0  . polyethylene glycol (MIRALAX) 0.34 gm/ml SOLN     . Probiotic Product (PROBIOTIC DAILY PO) Take 1 tablet by mouth.    . prochlorperazine (COMPAZINE) 10 MG tablet Take 1 tablet (10 mg total) by mouth every 6 (six) hours as needed (Nausea or vomiting). 30 tablet 1  . sucralfate (CARAFATE) 1 g tablet Take 1 tablet (1 g total) by mouth 4 (four) times daily -  with meals and at bedtime. 40 tablet 0   No current facility-administered medications for this visit.      OBJECTIVE: Young white woman in no acute distress Vitals:   08/23/18 1033  BP: 106/72  Pulse: 71  Resp: 18  Temp: 98.9 F (37.2 C)  SpO2: 100%     Body mass index is 24.27 kg/m.   Wt Readings from Last 3 Encounters:  08/23/18 142 lb 8 oz (64.6 kg)  08/09/18 140 lb 4.8 oz (63.6 kg)  08/02/18 140 lb 8 oz (63.7 kg)  ECOG FS:1 GENERAL: Patient is a well appearing female in no acute distress HEENT:  Sclerae anicteric.  Oropharynx clear and moist. No ulcerations or evidence  of oropharyngeal candidiasis. Neck is supple.  NODES:  No cervical, supraclavicular, or axillary lymphadenopathy palpated.  BREAST EXAM:  Deferred. LUNGS:  Clear to auscultation bilaterally.  No wheezes or rhonchi. HEART:  Regular rate and rhythm. No murmur appreciated. ABDOMEN:  Soft, nontender.  Positive, normoactive bowel sounds. No organomegaly palpated. MSK:  No focal spinal tenderness to palpation. Full range of motion bilaterally in the upper extremities. EXTREMITIES:  No peripheral edema.   SKIN:  Clear with no obvious rashes or skin changes. No nail dyscrasia. NEURO:  Nonfocal. Well oriented.  Appropriate affect.    LAB RESULTS:  CMP     Component Value Date/Time   NA 139 08/16/2018 1155   K 4.3 08/16/2018 1155   CL 105 08/16/2018 1155   CO2 25 08/16/2018 1155   GLUCOSE 91 08/16/2018 1155   BUN 13 08/16/2018 1155   CREATININE 0.70 08/16/2018 1155   CREATININE 0.76 05/15/2018 0827   CREATININE 0.78 01/15/2018 1033   CALCIUM 9.2 08/16/2018 1155   PROT 6.5 08/16/2018 1155   ALBUMIN 4.1 08/16/2018 1155   AST 15 08/16/2018 1155   AST 10 (L) 05/15/2018 0827   ALT 10 08/16/2018 1155   ALT 9 05/15/2018 0827   ALKPHOS 67 08/16/2018 1155   BILITOT 0.7 08/16/2018 1155   BILITOT 1.1 05/15/2018 0827   GFRNONAA >60 08/16/2018 1155   GFRNONAA >60 05/15/2018 0827   GFRNONAA 95 01/15/2018 1033   GFRAA >60 08/16/2018 1155   GFRAA >60 05/15/2018 0827   GFRAA 110 01/15/2018 1033    No results found for: TOTALPROTELP, ALBUMINELP, A1GS, A2GS, BETS, BETA2SER, GAMS, MSPIKE, SPEI  No results found for: KPAFRELGTCHN, LAMBDASER, KAPLAMBRATIO  Lab Results  Component Value Date   WBC 2.7 (L) 08/23/2018   NEUTROABS 1.7 08/23/2018   HGB 9.0 (L) 08/23/2018   HCT 26.7 (L) 08/23/2018   MCV 99.3 08/23/2018   PLT 189 08/23/2018    '@LASTCHEMISTRY'$ @  No results found for: LABCA2  No components found for: YYQMGN003  No results for input(s): INR in the last 168 hours.  No results  found for: LABCA2  No results found for: BCW888  No results found for: BVQ945  No results found for: WTU882  No results found for: CA2729  No components found for: HGQUANT  No results found for: CEA1 / No results found for: CEA1   No results found for: AFPTUMOR  No results found for: CHROMOGRNA  No results found for: PSA1  Appointment on 08/23/2018  Component Date Value Ref Range Status  . WBC 08/23/2018 2.7* 4.0 - 10.5 K/uL Final  . RBC 08/23/2018 2.69* 3.87 - 5.11 MIL/uL Final  . Hemoglobin 08/23/2018 9.0* 12.0 - 15.0 g/dL Final  . HCT 08/23/2018 26.7* 36.0 - 46.0 % Final  . MCV 08/23/2018 99.3  80.0 - 100.0 fL Final  . MCH 08/23/2018 33.5  26.0 - 34.0 pg Final  . MCHC 08/23/2018 33.7  30.0 - 36.0 g/dL Final  . RDW 08/23/2018 15.9* 11.5 - 15.5 % Final  . Platelets 08/23/2018 189  150 - 400 K/uL Final  . nRBC 08/23/2018 0.0  0.0 - 0.2 % Final  . Neutrophils Relative % 08/23/2018 64  % Final  . Neutro Abs 08/23/2018 1.7  1.7 - 7.7 K/uL Final  . Lymphocytes Relative 08/23/2018 23  % Final  . Lymphs Abs 08/23/2018 0.6* 0.7 - 4.0 K/uL Final  . Monocytes Relative 08/23/2018 11  % Final  . Monocytes Absolute 08/23/2018 0.3  0.1 - 1.0 K/uL Final  . Eosinophils Relative 08/23/2018 1  % Final  . Eosinophils Absolute 08/23/2018 0.0  0.0 - 0.5 K/uL Final  . Basophils Relative 08/23/2018 0  % Final  . Basophils Absolute 08/23/2018 0.0  0.0 - 0.1 K/uL Final  . Immature Granulocytes 08/23/2018 1  % Final  . Abs Immature Granulocytes 08/23/2018 0.02  0.00 - 0.07 K/uL Final   Performed at [facility], [place] [address] [place], [place] [address]    (this displays the last labs from the last 3 days)  No results found for: TOTALPROTELP, ALBUMINELP, A1GS, A2GS, BETS, BETA2SER, GAMS, MSPIKE, SPEI (this displays SPEP labs)  No results found for: KPAFRELGTCHN, LAMBDASER, KAPLAMBRATIO (kappa/lambda light chains)  No results found for: HGBA, HGBA2QUANT,  HGBFQUANT, HGBSQUAN (Hemoglobinopathy evaluation)   No results found for: LDH  Lab Results  Component Value Date   IRON 64 07/15/2018   TIBC 265 07/15/2018   IRONPCTSAT 24 07/15/2018   (Iron and TIBC)  Lab Results  Component Value Date   FERRITIN 92 07/15/2018    Urinalysis    Component Value Date/Time   COLORURINE YELLOW 12/18/2016 [place] 12/18/2016 1607   LABSPEC 1.010 12/18/2016 1607   PHURINE 7.0 12/18/2016 1607   GLUCOSEU NEGATIVE 12/18/2016 1607   HGBUR NEGATIVE 12/18/2016 1607   HGBUR negative 01/13/2010 0940   BILIRUBINUR NEGATIVE 12/18/2016 1607   BILIRUBINUR neg 05/17/2015 1034   KETONESUR NEGATIVE 12/18/2016 1607   PROTEINUR NEGATIVE 12/18/2016 1607   UROBILINOGEN 0.2 05/17/2015 1034   UROBILINOGEN 0.2 01/13/2010 0940   NITRITE NEGATIVE 12/18/2016 1607   LEUKOCYTESUR NEGATIVE 12/18/2016 1607    STUDIES:  US Breast Ltd Uni Right Inc Axilla  Result Date: 08/08/2018 CLINICAL DATA:  Patient returns for evaluation of the RIGHT breast. The patient is undergoing neoadjuvant chemotherapy for treatment of RIGHT breast cancer. The area of concern is a palpable mass in the UPPER OUTER QUADRANT of the RIGHT breast originally measuring 3.7 x 1.7 x 1.5 centimeters in the 10-11 o'clock location. EXAM: ULTRASOUND OF THE RIGHT BREAST COMPARISON:  05/28/2018 and earlier FINDINGS: On physical exam, I palpate no abnormality in the UPPER portion of the RIGHT breast. Targeted ultrasound is performed, showing vague area of acoustic shadowing/distortion in the 11 o'clock location  of the RIGHT breast 3 centimeters from the nipple, measuring 1.4 centimeters. No discrete mass is identified however. Evaluation of the RIGHT axilla shows lymph nodes with normal morphology. The most abnormal lymph node has a cortical thickening of 3 millimeters. IMPRESSION: Significant improvement in the appearance of RIGHT axillary adenopathy. Mass in the 11 o'clock is barely detectable  sonographically and is no longer palpable. RECOMMENDATION: Treatment plan for known malignancy. I have discussed the findings and recommendations with the patient. Results were also provided in writing at the conclusion of the visit. If applicable, a reminder letter will be sent to the patient regarding the next appointment. BI-RADS CATEGORY  6: Known biopsy-proven malignancy. Electronically Signed   By: Nolon Nations M.D.   On: 08/08/2018 10:07     ELIGIBLE FOR AVAILABLE RESEARCH PROTOCOL: Upbeat   ASSESSMENT: 43 y.o. [facility] woman, status post right breast upper outer quadrant and right axillary lymph node biopsy 05/03/2018 for a clinical T2 N2, stage IIIC invasive ductal carcinoma, triple negative, with an MIB-1 of 75%  (a) staging studies with CT scans of the chest abdomen and pelvis on 05/29/18 show no distant metastases; bone scan shows questionable focus of tracer accumulation on the proximal to mid right femoral diaphysis  (b) MRI breast on 05/31/18 shows additional non mass like enhancement throughout upper outer right breast that extends 5cm inferomedially from biopsy proven malignancy.    (1) genetics testing 06/06/2018--at [facility]--negative 11 gene panel   (2) neoadjuvant chemotherapy consisting of doxorubicin and cyclophosphamide in dose dense fashion x4 started 06/03/2018, to be  followed by paclitaxel and carboplatin weekly x12 starting 08/02/2018 (a) Echo on 05/23/2018 shows an EF of 60-65%.  (3) definitive surgery to follow (patient is planning on bilateral mastectomies)  (4) adjuvant radiation to follow surgery   PLAN: Darlene Grant is doing moderately well today.  We reviewed her labs and her chemotherapy regimen.  She is tolerating the weekly Paclitaxel and Carboplatin well and will continue this.  She has no peripheral neuropathy.  She is fatigued and unable to exercise.  She does however have two small children and stays active with keeping them.  I  encouraged her to do what she can do, and as long as she is staying active, it is certainly better than sitting all day.    Darlene Grant will return weekly for labs and her treatment.  Myself or Dr. Jana Hakim will see her with every other treatment.  She knows to call for any other issue that may develop before the next visit.  A total of (20) minutes of face-to-face time was spent with this patient with greater than 50% of that time in counseling and care-coordination.  Wilber Bihari, NP 08/23/18 10:38 AM Medical Oncology and Hematology [facility] [address] [place], [place] [address] Tel. [phone]    Fax. [phone]

## 2018-08-23 NOTE — Patient Instructions (Signed)
[facility] Discharge Instructions for Patients Receiving Chemotherapy  Today you received the following chemotherapy agents Taxol and Carboplatin.   To help prevent nausea and vomiting after your treatment, we encourage you to take your nausea medication as directed BUT NO ZOFRAN FOR 3 DAYS AFTER CHEMO.   If you develop nausea and vomiting that is not controlled by your nausea medication, call the clinic.   BELOW ARE SYMPTOMS THAT SHOULD BE REPORTED IMMEDIATELY:  *FEVER GREATER THAN 100.5 F  *CHILLS WITH OR WITHOUT FEVER  NAUSEA AND VOMITING THAT IS NOT CONTROLLED WITH YOUR NAUSEA MEDICATION  *UNUSUAL SHORTNESS OF BREATH  *UNUSUAL BRUISING OR BLEEDING  TENDERNESS IN MOUTH AND THROAT WITH OR WITHOUT PRESENCE OF ULCERS  *URINARY PROBLEMS  *BOWEL PROBLEMS  UNUSUAL RASH Items with * indicate a potential emergency and should be followed up as soon as possible.  Feel free to call the clinic you have any questions or concerns. The clinic phone number is [phone].  Please show the CHEMO ALERT CARD at check-in to the Emergency Department and triage nurse.    

## 2018-08-26 ENCOUNTER — Encounter: Payer: Self-pay | Admitting: Adult Health

## 2018-08-28 ENCOUNTER — Other Ambulatory Visit: Payer: Self-pay | Admitting: Adult Health

## 2018-08-28 DIAGNOSIS — C50411 Malignant neoplasm of upper-outer quadrant of right female breast: Secondary | ICD-10-CM

## 2018-08-28 DIAGNOSIS — Z171 Estrogen receptor negative status [ER-]: Secondary | ICD-10-CM

## 2018-08-29 ENCOUNTER — Encounter: Payer: Self-pay | Admitting: Adult Health

## 2018-08-30 ENCOUNTER — Inpatient Hospital Stay: Payer: BLUE CROSS/BLUE SHIELD

## 2018-08-30 ENCOUNTER — Other Ambulatory Visit: Payer: Self-pay

## 2018-08-30 ENCOUNTER — Telehealth: Payer: Self-pay

## 2018-08-30 ENCOUNTER — Telehealth: Payer: Self-pay | Admitting: Genetic Counselor

## 2018-08-30 ENCOUNTER — Other Ambulatory Visit: Payer: Self-pay | Admitting: Adult Health

## 2018-08-30 VITALS — BP 127/73 | HR 73 | Temp 99.2°F | Resp 18 | Ht 64.25 in | Wt 143.0 lb

## 2018-08-30 DIAGNOSIS — Z95828 Presence of other vascular implants and grafts: Secondary | ICD-10-CM

## 2018-08-30 DIAGNOSIS — T7840XA Allergy, unspecified, initial encounter: Secondary | ICD-10-CM

## 2018-08-30 DIAGNOSIS — Z5189 Encounter for other specified aftercare: Secondary | ICD-10-CM | POA: Diagnosis not present

## 2018-08-30 DIAGNOSIS — Z171 Estrogen receptor negative status [ER-]: Secondary | ICD-10-CM

## 2018-08-30 DIAGNOSIS — Z5111 Encounter for antineoplastic chemotherapy: Secondary | ICD-10-CM | POA: Diagnosis not present

## 2018-08-30 DIAGNOSIS — C50411 Malignant neoplasm of upper-outer quadrant of right female breast: Secondary | ICD-10-CM | POA: Diagnosis not present

## 2018-08-30 DIAGNOSIS — C773 Secondary and unspecified malignant neoplasm of axilla and upper limb lymph nodes: Secondary | ICD-10-CM | POA: Diagnosis not present

## 2018-08-30 LAB — CBC WITH DIFFERENTIAL/PLATELET
ABS IMMATURE GRANULOCYTES: 0.02 10*3/uL (ref 0.00–0.07)
BASOS PCT: 0 %
Basophils Absolute: 0 10*3/uL (ref 0.0–0.1)
EOS ABS: 0 10*3/uL (ref 0.0–0.5)
Eosinophils Relative: 0 %
HCT: 25.1 % — ABNORMAL LOW (ref 36.0–46.0)
Hemoglobin: 8.5 g/dL — ABNORMAL LOW (ref 12.0–15.0)
IMMATURE GRANULOCYTES: 1 %
Lymphocytes Relative: 24 %
Lymphs Abs: 0.6 10*3/uL — ABNORMAL LOW (ref 0.7–4.0)
MCH: 33.9 pg (ref 26.0–34.0)
MCHC: 33.9 g/dL (ref 30.0–36.0)
MCV: 100 fL (ref 80.0–100.0)
Monocytes Absolute: 0.3 10*3/uL (ref 0.1–1.0)
Monocytes Relative: 10 %
NEUTROS PCT: 65 %
Neutro Abs: 1.6 10*3/uL — ABNORMAL LOW (ref 1.7–7.7)
Platelets: 149 10*3/uL — ABNORMAL LOW (ref 150–400)
RBC: 2.51 MIL/uL — ABNORMAL LOW (ref 3.87–5.11)
RDW: 15.1 % (ref 11.5–15.5)
WBC: 2.5 10*3/uL — ABNORMAL LOW (ref 4.0–10.5)
nRBC: 0 % (ref 0.0–0.2)

## 2018-08-30 LAB — COMPREHENSIVE METABOLIC PANEL
ALBUMIN: 4.1 g/dL (ref 3.5–5.0)
ALT: 13 U/L (ref 0–44)
AST: 15 U/L (ref 15–41)
Alkaline Phosphatase: 64 U/L (ref 38–126)
Anion gap: 9 (ref 5–15)
BILIRUBIN TOTAL: 1 mg/dL (ref 0.3–1.2)
BUN: 12 mg/dL (ref 6–20)
CO2: 25 mmol/L (ref 22–32)
Calcium: 8.9 mg/dL (ref 8.9–10.3)
Chloride: 104 mmol/L (ref 98–111)
Creatinine, Ser: 0.68 mg/dL (ref 0.44–1.00)
GFR calc Af Amer: >60 mL/min (ref >60)
GFR calc non Af Amer: >60 mL/min (ref >60)
Glucose, Bld: 94 mg/dL (ref 70–99)
Potassium: 4 mmol/L (ref 3.5–5.1)
SODIUM: 138 mmol/L (ref 135–145)
Total Protein: 6.5 g/dL (ref 6.5–8.1)

## 2018-08-30 MED ORDER — DIPHENHYDRAMINE HCL 12.5 MG/5ML PO ELIX
12.5000 mg | ORAL_SOLUTION | Freq: Once | ORAL | Status: AC
  Administered 2018-08-30: 12.5 mg via ORAL

## 2018-08-30 MED ORDER — SODIUM CHLORIDE 0.9 % IV SOLN
243.0000 mg | Freq: Once | INTRAVENOUS | Status: AC
  Administered 2018-08-30: 240 mg via INTRAVENOUS
  Filled 2018-08-30: qty 24

## 2018-08-30 MED ORDER — PALONOSETRON HCL INJECTION 0.25 MG/5ML
0.2500 mg | Freq: Once | INTRAVENOUS | Status: AC
  Administered 2018-08-30: 0.25 mg via INTRAVENOUS

## 2018-08-30 MED ORDER — SODIUM CHLORIDE 0.9 % IV SOLN
Freq: Once | INTRAVENOUS | Status: AC
  Administered 2018-08-30: 13:00:00 via INTRAVENOUS
  Filled 2018-08-30: qty 250

## 2018-08-30 MED ORDER — HEPARIN SOD (PORK) LOCK FLUSH 100 UNIT/ML IV SOLN
500.0000 [IU] | Freq: Once | INTRAVENOUS | Status: AC | PRN
  Administered 2018-08-30: 500 [IU]
  Filled 2018-08-30: qty 5

## 2018-08-30 MED ORDER — SODIUM CHLORIDE 0.9 % IV SOLN
80.0000 mg/m2 | Freq: Once | INTRAVENOUS | Status: AC
  Administered 2018-08-30: 138 mg via INTRAVENOUS
  Filled 2018-08-30: qty 23

## 2018-08-30 MED ORDER — SODIUM CHLORIDE 0.9% FLUSH
10.0000 mL | INTRAVENOUS | Status: DC | PRN
  Administered 2018-08-30: 10 mL
  Filled 2018-08-30: qty 10

## 2018-08-30 MED ORDER — DIPHENHYDRAMINE HCL 12.5 MG/5ML PO ELIX
ORAL_SOLUTION | ORAL | Status: AC
  Filled 2018-08-30: qty 5

## 2018-08-30 MED ORDER — PALONOSETRON HCL INJECTION 0.25 MG/5ML
INTRAVENOUS | Status: AC
  Filled 2018-08-30: qty 5

## 2018-08-30 MED ORDER — SODIUM CHLORIDE 0.9 % IV SOLN: 4.0000 mg | Freq: Once | INTRAVENOUS | Status: DC

## 2018-08-30 MED ORDER — DEXAMETHASONE SODIUM PHOSPHATE 10 MG/ML IJ SOLN
4.0000 mg | Freq: Once | INTRAMUSCULAR | Status: AC
  Administered 2018-08-30: 4 mg via INTRAVENOUS

## 2018-08-30 MED ORDER — DEXAMETHASONE SODIUM PHOSPHATE 10 MG/ML IJ SOLN
INTRAMUSCULAR | Status: AC
  Filled 2018-08-30: qty 1

## 2018-08-30 MED ORDER — SODIUM CHLORIDE 0.9 % IV SOLN
20.0000 mg | Freq: Once | INTRAVENOUS | Status: AC
  Administered 2018-08-30: 20 mg via INTRAVENOUS
  Filled 2018-08-30: qty 2

## 2018-08-30 MED ORDER — ACETAMINOPHEN 325 MG PO TABS
ORAL_TABLET | ORAL | Status: AC
  Filled 2018-08-30: qty 2

## 2018-08-30 MED ORDER — ACETAMINOPHEN 325 MG PO TABS
650.0000 mg | ORAL_TABLET | Freq: Once | ORAL | Status: AC
  Administered 2018-08-30: 650 mg via ORAL

## 2018-08-30 MED ORDER — FLUTICASONE PROPIONATE 50 MCG/ACT NA SUSP: 2.0000 | Freq: Every day | NASAL | 2 refills | Status: DC

## 2018-08-30 NOTE — Patient Instructions (Signed)
[facility] Discharge Instructions for Patients Receiving Chemotherapy  Today you received the following chemotherapy agents: Paclitaxel (Taxol) and Carboplatin (Paraplatin)  To help prevent nausea and vomiting after your treatment, we encourage you to take your nausea medication as directed.    If you develop nausea and vomiting that is not controlled by your nausea medication, call the clinic.   BELOW ARE SYMPTOMS THAT SHOULD BE REPORTED IMMEDIATELY:  *FEVER GREATER THAN 100.5 F  *CHILLS WITH OR WITHOUT FEVER  NAUSEA AND VOMITING THAT IS NOT CONTROLLED WITH YOUR NAUSEA MEDICATION  *UNUSUAL SHORTNESS OF BREATH  *UNUSUAL BRUISING OR BLEEDING  TENDERNESS IN MOUTH AND THROAT WITH OR WITHOUT PRESENCE OF ULCERS  *URINARY PROBLEMS  *BOWEL PROBLEMS  UNUSUAL RASH Items with * indicate a potential emergency and should be followed up as soon as possible.  Feel free to call the clinic should you have any questions or concerns. The clinic phone number is [phone].  Please show the CHEMO ALERT CARD at check-in to the Emergency Department and triage nurse.   

## 2018-08-30 NOTE — Telephone Encounter (Signed)
LM on VM that Darlene Grant asked we call to schedule her husband in genetics.  Asked that she CB.

## 2018-08-30 NOTE — Telephone Encounter (Signed)
Patient at center today for treatment.  This nurse gave her the signed document to receive massage and cranial prothesis prescription.  Let patient know that Flonase was sent to her pharmacy in [place] as requested.

## 2018-09-05 NOTE — Progress Notes (Signed)
[place]  [phone] [phone]    ID: Darlene Grant DOB: 11-Jan-1976  MR#: [id number]  SWF#:[id number]  Patient Care Team: Kimbella Heisler, Virgie Dad, MD as PCP - General (Oncology) Rolm Bookbinder, MD as Consulting Physician (General Surgery) Colby Catanese, Virgie Dad, MD as Consulting Physician (Oncology) Gery Pray, MD as Consulting Physician (Radiation Oncology) Emily Filbert, MD as Consulting Physician (Obstetrics and Gynecology) Lavonna Monarch, MD as Consulting Physician (Dermatology) OTHER MD:   CHIEF COMPLAINT: Triple negative breast cancer  CURRENT TREATMENT: Neoadjuvant chemotherapy   HISTORY OF CURRENT ILLNESS: From the original intake note:  Darlene Grant had routine screening mammography on 04/19/2018 showing a possible abnormality in the right breast. She underwent unilateral right diagnostic mammography with tomography and right breast ultrasonography at [facility] on 05/02/2018 showing: Breast Density Category C: There was an oval circumscribed mass in the posterior aspect of the upper outer right breast.  On exam this measured 3.5 cm, was firm, irregular and centered on 10-11 o'clock 5 cm from the nipple.  By ultrasonography there was a 3.7 x 1.7 x 1.5 cm elongated, irregular, hypoechoic mass in the 10:30 o'clock position of the right breast, 5 cm from the nipple.  Ultrasound of the right axilla showed 7 abnormal right axillary lymph nodes highly suspicious for metastatic nodes.  Accordingly on 05/03/2018 she proceeded to biopsy of the right breast area in question and one suspicious lymph node. The pathology from this procedure showed ([id number]): Right Breast: invasive mammary carcinoma, grade 3, E-cadherin positive (and therefore ductal) lymphovascular invasion present. Lymph node: Invasive mammary carcinoma Prognostic indicators significant for: estrogen receptor, 0% negative and progesterone receptor, 0%  negative. Proliferation marker Ki67 at 75%. HER2 negative (1+).  The patient's subsequent history is as detailed below.   INTERVAL HISTORY: Darlene Grant returns today for follow-up and treatment of her triple negative breast cancer.   She continues on neoadjuvant chemotherapy consisting of weekly paclitaxel and carboplatin x12. Today is day 1 cycle 6. Her nausea is better than in the past. She had to adjust the dexamethazone at her last appointment due to the insomnia issues from it.   Since her last visit here, she has not undergone any additional studies.    REVIEW OF SYSTEMS: Darlene Grant reports that her and her family have been social distancing and self quarantining the last few weeks due to COVID-19 concerns. She has been completing her usual house chores. She exercised this week some as well. She notes that she was hungry this morning and that she has coffee for the first time since 05/2018. Her appetite is not completely normal, but she is able to taste more now than in the past. She has not had a menstrual cycle since 06/2018. She notes a small "ball" in her left breast that she has not noticed before prior visits. The patient denies unusual headaches, visual changes, vomiting, stiff neck, dizziness, or gait imbalance. There has been no cough, phlegm production, or pleurisy, no chest pain or pressure, and no change in bowel or bladder habits. The patient denies fever, rash, bleeding, unexplained fatigue or unexplained weight loss. A detailed review of systems was otherwise entirely negative.   PAST MEDICAL HISTORY: Past Medical History:  Diagnosis Date   Abnormal Pap smear    ASCUS 03/13/11   Anemia    Breast pain, left    Cancer (HCC)    Candida vaginitis    Complication of anesthesia    pt states that all narcotics make  her angry and she would prefer to avoid them.   Rectal itching    Vaginal Pap smear, abnormal     PAST SURGICAL HISTORY: Past Surgical History:  Procedure  Laterality Date   BREAST BIOPSY Right 8/16   CESAREAN SECTION N/A 11/21/2013   Procedure: CESAREAN SECTION;  Surgeon: Alwyn Pea, MD;  Location: [facility];  Service: Obstetrics;  Laterality: N/A;   CESAREAN SECTION N/A 06/23/2016   Procedure: CESAREAN SECTION;  Surgeon: Emily Filbert, MD;  Location: [facility];  Service: Obstetrics;  Laterality: N/A;   HAND SURGERY  04/2012   ligament repair   PILONIDAL CYST EXCISION  2000   PORTACATH PLACEMENT N/A 05/28/2018   Procedure: INSERTION PORT-A-CATH WITH ULTRASOUND;  Surgeon: Rolm Bookbinder, MD;  Location: [facility];  Service: General;  Laterality: N/A;   WISDOM TOOTH EXTRACTION  AGE 43    FAMILY HISTORY: Family History  Problem Relation Age of Onset   Cancer Mother 53       breast, premenopausal   Breast cancer Mother    Cancer Maternal Grandmother        skin, stomach   Cancer Maternal Grandfather        lung   As of November 2019 her father is 25 years old with a history of Parkinson's. Patients' mother is 21 years old with a history of breast cancer, diagnosed age 2, treated with radical mastectomy. The patient has no brothers and two sisters. Besides the patient's mother, the patient has no other family medical history of breast or ovarian cancer. Her maternal grandmother had stomach cancer and her maternal grandfather had lung cancer, --he was a smoker.    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  Her husband, [name], an Chief Financial Officer. She has two children, [name], age 34, and [name], age 63 months.  The patient is not a smoker. She does drink alcohol and drinks an estimated one to two drinks per week.     ADVANCED DIRECTIVES: [name],  her husband, is her healthcare department   HEALTH MAINTENANCE: Social History   Tobacco Use   Smoking status: Never Smoker   Smokeless tobacco: Never Used  Substance Use Topics   Alcohol use: Yes    Alcohol/week: 0.0 standard drinks    Comment: occassionally   Drug use: No    Colonoscopy: no  PAP: 12/2017  Bone density: no   Allergies  Allergen Reactions   Augmentin [Amoxicillin-Pot Clavulanate]     diarrhea   Hydrocodone Itching   Keflex [Cephalexin] Diarrhea    Current Outpatient Medications  Medication Sig Dispense Refill   ciprofloxacin (CIPRO) 500 MG tablet Take one tablet by mouth 2 times daily for 5 days after each chemo cycle, starting on day 8 of chemo 40 tablet 1   cyclobenzaprine (FLEXERIL) 10 MG tablet Take 0.5-1 tablets (5-10 mg total) by mouth 3 (three) times daily as needed for muscle spasms. 30 tablet 1   dexamethasone (DECADRON) 4 MG tablet Take 2 tablets by mouth once a day on the day after chemotherapy and then take 2 tablets two times a day  for 2 days. Take with food. 30 tablet 1   famotidine (PEPCID) 20 MG tablet Take 1 tablet (20 mg total) by mouth 2 (two) times daily.     fluticasone (FLONASE) 50 MCG/ACT nasal spray Place 2 sprays into both nostrils daily. 16 g 2   lidocaine-prilocaine (EMLA) cream Apply to affected area once 30 g 3   loratadine (CLARITIN) 10 MG tablet Take 10 mg by mouth daily as needed for allergies.     LORazepam (ATIVAN) 0.5 MG tablet Take 1 tablet (0.5 mg total) by mouth at bedtime as needed (Nausea or vomiting). 30 tablet 0   polyethylene glycol (MIRALAX) 0.34 gm/ml SOLN      Probiotic Product (PROBIOTIC DAILY PO) Take 1 tablet by mouth.     prochlorperazine (COMPAZINE) 10 MG tablet Take 1 tablet (10 mg total) by mouth every 6 (six) hours as needed (Nausea or vomiting). 30 tablet 1   sucralfate (CARAFATE) 1 g tablet Take 1 tablet (1 g total) by mouth 4 (four) times daily -  with meals and at bedtime. 40 tablet 0    No current facility-administered medications for this visit.      OBJECTIVE: Young white woman who appears well  Vitals:   09/06/18 1128  BP: 122/77  Pulse: 79  Resp: 18  Temp: 98.9 F (37.2 C)  SpO2: 100%     Body mass index is 24.29 kg/m.   Wt Readings from Last 3 Encounters:  09/06/18 142 lb 9.6 oz (64.7 kg)  08/30/18 143 lb (64.9 kg)  08/23/18 142 lb 8 oz (64.6 kg)  ECOG FS:1  Sclerae unicteric, EOMs intact Oropharynx clear and moist No cervical or supraclavicular adenopathy Lungs no rales or rhonchi Heart regular rate and rhythm Abd soft, nontender, positive bowel sounds MSK no focal spinal tenderness, no upper extremity lymphedema Neuro: nonfocal, well oriented, appropriate affect Breasts: I can still feel a mass in the inferior aspect of the right breast.  She was feeling a change in the medial aspect of the left breast.  I think what she is feeling there is a slight ligament that may be slightly irritated.  Both axillae are benign.   LAB RESULTS:  CMP     Component Value Date/Time   NA 138 08/30/2018 1157   K 4.0 08/30/2018 1157   CL 104 08/30/2018 1157   CO2 25 08/30/2018 1157   GLUCOSE 94 08/30/2018 1157   BUN 12 08/30/2018 1157   CREATININE 0.68 08/30/2018 1157   CREATININE 0.76 05/15/2018 0827   CREATININE 0.78 01/15/2018 1033   CALCIUM 8.9 08/30/2018 1157   PROT 6.5 08/30/2018 1157   ALBUMIN 4.1 08/30/2018 1157   AST 15 08/30/2018 1157   AST 10 (L) 05/15/2018 0827   ALT 13 08/30/2018 1157   ALT 9 05/15/2018 0827   ALKPHOS 64 08/30/2018 1157   BILITOT 1.0 08/30/2018 1157   BILITOT 1.1 05/15/2018 0827   GFRNONAA >60 08/30/2018 1157   GFRNONAA >60 05/15/2018 0827   GFRNONAA 95 01/15/2018 1033   GFRAA >60 08/30/2018 1157   GFRAA >60 05/15/2018 0827   GFRAA 110 01/15/2018 1033    No results found for: TOTALPROTELP, ALBUMINELP, A1GS, A2GS, BETS, BETA2SER, GAMS, MSPIKE, SPEI  No results found for: KPAFRELGTCHN, LAMBDASER, KAPLAMBRATIO  Lab  Results  Component Value Date   WBC 2.2 (L) 09/06/2018   NEUTROABS 1.4 (L) 09/06/2018   HGB 8.4 (L) 09/06/2018   HCT 25.2 (L) 09/06/2018   MCV 101.2 (H) 09/06/2018   PLT  125 (L) 09/06/2018    _0 @  No results found for: LABCA2  No components found for: [id number]  No results for input(s): INR in the last 168 hours.  No results found for: LABCA2  No results found for: FYT244  No results found for: QKM638  No results found for: TRR116  No results found for: CA2729  No components found for: HGQUANT  No results found for: CEA1 / No results found for: CEA1   No results found for: AFPTUMOR  No results found for: [place]  No results found for: PSA1  Appointment on 09/06/2018  Component Date Value Ref Range Status   WBC 09/06/2018 2.2* 4.0 - 10.5 K/uL Final   RBC 09/06/2018 2.49* 3.87 - 5.11 MIL/uL Final   Hemoglobin 09/06/2018 8.4* 12.0 - 15.0 g/dL Final   HCT 09/06/2018 25.2* 36.0 - 46.0 % Final   MCV 09/06/2018 101.2* 80.0 - 100.0 fL Final   MCH 09/06/2018 33.7  26.0 - 34.0 pg Final   MCHC 09/06/2018 33.3  30.0 - 36.0 g/dL Final   RDW 09/06/2018 14.4  11.5 - 15.5 % Final   Platelets 09/06/2018 125* 150 - 400 K/uL Final   nRBC 09/06/2018 0.0  0.0 - 0.2 % Final   Neutrophils Relative % 09/06/2018 64  % Final   Neutro Abs 09/06/2018 1.4* 1.7 - 7.7 K/uL Final   Lymphocytes Relative 09/06/2018 26  % Final   Lymphs Abs 09/06/2018 0.6* 0.7 - 4.0 K/uL Final   Monocytes Relative 09/06/2018 8  % Final   Monocytes Absolute 09/06/2018 0.2  0.1 - 1.0 K/uL Final   Eosinophils Relative 09/06/2018 1  % Final   Eosinophils Absolute 09/06/2018 0.0  0.0 - 0.5 K/uL Final   Basophils Relative 09/06/2018 1  % Final   Basophils Absolute 09/06/2018 0.0  0.0 - 0.1 K/uL Final   Immature Granulocytes 09/06/2018 0  % Final   Abs Immature Granulocytes 09/06/2018 0.00  0.00 - 0.07 K/uL Final   Performed at [facility], [place]  [address] [place], [place] [address]    (this displays the last labs from the last 3 days)  No results found for: TOTALPROTELP, ALBUMINELP, A1GS, A2GS, BETS, BETA2SER, GAMS, MSPIKE, SPEI (this displays SPEP labs)  No results found for: KPAFRELGTCHN, LAMBDASER, KAPLAMBRATIO (kappa/lambda light chains)  No results found for: HGBA, HGBA2QUANT, HGBFQUANT, HGBSQUAN (Hemoglobinopathy evaluation)   No results found for: LDH  Lab Results  Component Value Date   IRON 64 07/15/2018   TIBC 265 07/15/2018   IRONPCTSAT 24 07/15/2018   (Iron and TIBC)  Lab Results  Component Value Date   FERRITIN 92 07/15/2018    Urinalysis    Component Value Date/Time   COLORURINE YELLOW 12/18/2016 [place] 12/18/2016 1607   LABSPEC 1.010 12/18/2016 1607   PHURINE 7.0 12/18/2016 1607   GLUCOSEU NEGATIVE 12/18/2016 1607   HGBUR NEGATIVE 12/18/2016 1607   HGBUR negative 01/13/2010 0940   BILIRUBINUR NEGATIVE 12/18/2016 1607   BILIRUBINUR neg 05/17/2015 1034   KETONESUR NEGATIVE 12/18/2016 1607   PROTEINUR NEGATIVE 12/18/2016 1607   UROBILINOGEN 0.2 05/17/2015 1034   UROBILINOGEN 0.2 01/13/2010 0940   NITRITE NEGATIVE 12/18/2016 1607   LEUKOCYTESUR NEGATIVE 12/18/2016 1607    STUDIES:  US Breast Ltd Uni Right Inc Axilla  Result Date: 08/08/2018 CLINICAL DATA:  Patient returns for evaluation of the RIGHT breast. The patient is undergoing neoadjuvant chemotherapy for treatment of RIGHT breast cancer. The area of concern is a palpable  mass in the [place] of the RIGHT breast originally measuring 3.7 x 1.7 x 1.5 centimeters in the 10-11 o'clock location. EXAM: ULTRASOUND OF THE RIGHT BREAST COMPARISON:  05/28/2018 and earlier FINDINGS: On physical exam, I palpate no abnormality in the UPPER portion of the RIGHT breast. Targeted ultrasound is performed, showing vague area of acoustic shadowing/distortion in the 11 o'clock location of the RIGHT breast 3 centimeters from the  nipple, measuring 1.4 centimeters. No discrete mass is identified however. Evaluation of the RIGHT axilla shows lymph nodes with normal morphology. The most abnormal lymph node has a cortical thickening of 3 millimeters. IMPRESSION: Significant improvement in the appearance of RIGHT axillary adenopathy. Mass in the 11 o'clock is barely detectable sonographically and is no longer palpable. RECOMMENDATION: Treatment plan for known malignancy. I have discussed the findings and recommendations with the patient. Results were also provided in writing at the conclusion of the visit. If applicable, a reminder letter will be sent to the patient regarding the next appointment. BI-RADS CATEGORY  6: Known biopsy-proven malignancy. Electronically Signed   By: Nolon Nations M.D.   On: 08/08/2018 10:07     ELIGIBLE FOR AVAILABLE RESEARCH PROTOCOL: S 1418   ASSESSMENT: 43 y.o. [facility] woman, status post right breast upper outer quadrant and right axillary lymph node biopsy 05/03/2018 for a clinical T2 N2, stage IIIC invasive ductal carcinoma, triple negative, with an MIB-1 of 75%  (a) staging studies with CT scans of the chest abdomen and pelvis on 05/29/18 show no distant metastases; bone scan shows questionable focus of tracer accumulation on the proximal to mid right femoral diaphysis  (b) MRI breast on 05/31/18 shows additional non mass like enhancement throughout upper outer right breast that extends 5cm inferomedially from biopsy proven malignancy.    (1) genetics testing 06/06/2018--at [facility]--negative 11 gene panel   (2) neoadjuvant chemotherapy consisting of doxorubicin and cyclophosphamide in dose dense fashion x4 started 06/03/2018, completed 07/15/2018, followed by paclitaxel and carboplatin weekly x12 starting 08/02/2018 (a) Echo on 05/23/2018 shows an EF of 60-65%.  (3) definitive surgery to follow (patient is planning on bilateral mastectomies with no reconstruction)  (4)  adjuvant radiation to follow surgery  (5) may be a candidate for S 1418  (6) consider adjuvant capecitabine depending on final surgical results   PLAN: Darlene Grant is finally getting over the side effects of her earlier treatments, which were more intense.  She is tolerating the current carboplatin/paclitaxel quite well and certainly there has been no evidence of neuropathy to date.  She has just gone back to exercising.  The slight irregularity she is feeling in the medial aspect of her left breast, right close to the breastbone, seems to be to be musculoskeletal and not a cancer but in any case she is going to have breast MRI in about 6 more weeks and we will image that area at that time.  I think she is going to be a candidate for S14 18.  That means she would likely keep her port at the time of surgery.  She is planning on bilateral mastectomies with no reconstruction.  She understands by doing that she is foregoing the option of nipple sparing mastectomies.  She knows to call for any other issue that may develop before the next visit.  Raja Liska, Virgie Dad, MD  09/06/18 12:03 PM Medical Oncology and Hematology [facility] [address] [place], [place] [address] Tel. [phone]    Fax. [phone]  I, [employer]  Handy am acting as a Education administrator for Chauncey Cruel, MD.   I, Lurline Del MD, have reviewed the above documentation for accuracy and completeness, and I agree with the above.

## 2018-09-06 ENCOUNTER — Inpatient Hospital Stay: Payer: BLUE CROSS/BLUE SHIELD

## 2018-09-06 ENCOUNTER — Ambulatory Visit: Payer: BLUE CROSS/BLUE SHIELD | Admitting: Adult Health

## 2018-09-06 ENCOUNTER — Inpatient Hospital Stay (HOSPITAL_BASED_OUTPATIENT_CLINIC_OR_DEPARTMENT_OTHER): Payer: BLUE CROSS/BLUE SHIELD | Admitting: Oncology

## 2018-09-06 ENCOUNTER — Other Ambulatory Visit: Payer: Self-pay

## 2018-09-06 VITALS — BP 122/77 | HR 79 | Temp 98.9°F | Resp 18 | Ht 64.25 in | Wt 142.6 lb

## 2018-09-06 DIAGNOSIS — Z171 Estrogen receptor negative status [ER-]: Secondary | ICD-10-CM

## 2018-09-06 DIAGNOSIS — C773 Secondary and unspecified malignant neoplasm of axilla and upper limb lymph nodes: Secondary | ICD-10-CM

## 2018-09-06 DIAGNOSIS — C50411 Malignant neoplasm of upper-outer quadrant of right female breast: Secondary | ICD-10-CM

## 2018-09-06 DIAGNOSIS — Z5189 Encounter for other specified aftercare: Secondary | ICD-10-CM | POA: Diagnosis not present

## 2018-09-06 DIAGNOSIS — Z5111 Encounter for antineoplastic chemotherapy: Secondary | ICD-10-CM | POA: Diagnosis not present

## 2018-09-06 DIAGNOSIS — Z95828 Presence of other vascular implants and grafts: Secondary | ICD-10-CM

## 2018-09-06 LAB — COMPREHENSIVE METABOLIC PANEL
ALBUMIN: 4.2 g/dL (ref 3.5–5.0)
ALT: 13 U/L (ref 0–44)
AST: 15 U/L (ref 15–41)
Alkaline Phosphatase: 67 U/L (ref 38–126)
Anion gap: 9 (ref 5–15)
BUN: 11 mg/dL (ref 6–20)
CO2: 25 mmol/L (ref 22–32)
CREATININE: 0.7 mg/dL (ref 0.44–1.00)
Calcium: 9 mg/dL (ref 8.9–10.3)
Chloride: 105 mmol/L (ref 98–111)
GFR calc Af Amer: >60 mL/min (ref >60)
GFR calc non Af Amer: >60 mL/min (ref >60)
Glucose, Bld: 91 mg/dL (ref 70–99)
Potassium: 4.2 mmol/L (ref 3.5–5.1)
Sodium: 139 mmol/L (ref 135–145)
Total Bilirubin: 1 mg/dL (ref 0.3–1.2)
Total Protein: 6.7 g/dL (ref 6.5–8.1)

## 2018-09-06 LAB — CBC WITH DIFFERENTIAL/PLATELET
Abs Immature Granulocytes: 0 10*3/uL (ref 0.00–0.07)
Basophils Absolute: 0 10*3/uL (ref 0.0–0.1)
Basophils Relative: 1 %
Eosinophils Absolute: 0 10*3/uL (ref 0.0–0.5)
Eosinophils Relative: 1 %
HCT: 25.2 % — ABNORMAL LOW (ref 36.0–46.0)
Hemoglobin: 8.4 g/dL — ABNORMAL LOW (ref 12.0–15.0)
Immature Granulocytes: 0 %
Lymphocytes Relative: 26 %
Lymphs Abs: 0.6 10*3/uL — ABNORMAL LOW (ref 0.7–4.0)
MCH: 33.7 pg (ref 26.0–34.0)
MCHC: 33.3 g/dL (ref 30.0–36.0)
MCV: 101.2 fL — ABNORMAL HIGH (ref 80.0–100.0)
MONOS PCT: 8 %
Monocytes Absolute: 0.2 10*3/uL (ref 0.1–1.0)
Neutro Abs: 1.4 10*3/uL — ABNORMAL LOW (ref 1.7–7.7)
Neutrophils Relative %: 64 %
PLATELETS: 125 10*3/uL — AB (ref 150–400)
RBC: 2.49 MIL/uL — ABNORMAL LOW (ref 3.87–5.11)
RDW: 14.4 % (ref 11.5–15.5)
WBC: 2.2 10*3/uL — ABNORMAL LOW (ref 4.0–10.5)
nRBC: 0 % (ref 0.0–0.2)

## 2018-09-06 MED ORDER — DEXAMETHASONE SODIUM PHOSPHATE 10 MG/ML IJ SOLN
INTRAMUSCULAR | Status: AC
  Filled 2018-09-06: qty 1

## 2018-09-06 MED ORDER — PALONOSETRON HCL INJECTION 0.25 MG/5ML
0.2500 mg | Freq: Once | INTRAVENOUS | Status: AC
  Administered 2018-09-06: 0.25 mg via INTRAVENOUS

## 2018-09-06 MED ORDER — ACETAMINOPHEN 325 MG PO TABS
ORAL_TABLET | ORAL | Status: AC
  Filled 2018-09-06: qty 2

## 2018-09-06 MED ORDER — SODIUM CHLORIDE 0.9% FLUSH
10.0000 mL | INTRAVENOUS | Status: DC | PRN
  Administered 2018-09-06: 10 mL
  Filled 2018-09-06: qty 10

## 2018-09-06 MED ORDER — PALONOSETRON HCL INJECTION 0.25 MG/5ML
INTRAVENOUS | Status: AC
  Filled 2018-09-06: qty 5

## 2018-09-06 MED ORDER — SODIUM CHLORIDE 0.9 % IV SOLN
20.0000 mg | Freq: Once | INTRAVENOUS | Status: AC
  Administered 2018-09-06: 20 mg via INTRAVENOUS
  Filled 2018-09-06: qty 2

## 2018-09-06 MED ORDER — DIPHENHYDRAMINE HCL 12.5 MG/5ML PO ELIX
ORAL_SOLUTION | ORAL | Status: AC
  Filled 2018-09-06: qty 5

## 2018-09-06 MED ORDER — DEXAMETHASONE SODIUM PHOSPHATE 4 MG/ML IJ SOLN
4.0000 mg | Freq: Once | INTRAMUSCULAR | Status: DC
  Filled 2018-09-06: qty 1

## 2018-09-06 MED ORDER — SODIUM CHLORIDE 0.9 % IV SOLN
Freq: Once | INTRAVENOUS | Status: AC
  Administered 2018-09-06: 13:00:00 via INTRAVENOUS
  Filled 2018-09-06: qty 250

## 2018-09-06 MED ORDER — SODIUM CHLORIDE 0.9 % IV SOLN
243.0000 mg | Freq: Once | INTRAVENOUS | Status: AC
  Administered 2018-09-06: 240 mg via INTRAVENOUS
  Filled 2018-09-06: qty 24

## 2018-09-06 MED ORDER — SODIUM CHLORIDE 0.9 % IV SOLN
80.0000 mg/m2 | Freq: Once | INTRAVENOUS | Status: AC
  Administered 2018-09-06: 138 mg via INTRAVENOUS
  Filled 2018-09-06: qty 23

## 2018-09-06 MED ORDER — HEPARIN SOD (PORK) LOCK FLUSH 100 UNIT/ML IV SOLN
500.0000 [IU] | Freq: Once | INTRAVENOUS | Status: AC | PRN
  Administered 2018-09-06: 500 [IU]
  Filled 2018-09-06: qty 5

## 2018-09-06 MED ORDER — SODIUM CHLORIDE 0.9 % IV SOLN: 4.0000 mg | Freq: Once | INTRAVENOUS | Status: DC

## 2018-09-06 MED ORDER — ACETAMINOPHEN 325 MG PO TABS
650.0000 mg | ORAL_TABLET | Freq: Once | ORAL | Status: AC
  Administered 2018-09-06: 650 mg via ORAL

## 2018-09-06 MED ORDER — DEXAMETHASONE SODIUM PHOSPHATE 10 MG/ML IJ SOLN
4.0000 mg | Freq: Once | INTRAMUSCULAR | Status: AC
  Administered 2018-09-06: 4 mg via INTRAVENOUS

## 2018-09-06 MED ORDER — DIPHENHYDRAMINE HCL 12.5 MG/5ML PO ELIX
12.5000 mg | ORAL_SOLUTION | Freq: Once | ORAL | Status: AC
  Administered 2018-09-06: 12.5 mg via ORAL

## 2018-09-06 NOTE — Patient Instructions (Signed)
[facility] Discharge Instructions for Patients Receiving Chemotherapy  Today you received the following chemotherapy agents: Paclitaxel (Taxol) and Carboplatin (Paraplatin)  To help prevent nausea and vomiting after your treatment, we encourage you to take your nausea medication as directed.    If you develop nausea and vomiting that is not controlled by your nausea medication, call the clinic.   BELOW ARE SYMPTOMS THAT SHOULD BE REPORTED IMMEDIATELY:  *FEVER GREATER THAN 100.5 F  *CHILLS WITH OR WITHOUT FEVER  NAUSEA AND VOMITING THAT IS NOT CONTROLLED WITH YOUR NAUSEA MEDICATION  *UNUSUAL SHORTNESS OF BREATH  *UNUSUAL BRUISING OR BLEEDING  TENDERNESS IN MOUTH AND THROAT WITH OR WITHOUT PRESENCE OF ULCERS  *URINARY PROBLEMS  *BOWEL PROBLEMS  UNUSUAL RASH Items with * indicate a potential emergency and should be followed up as soon as possible.  Feel free to call the clinic should you have any questions or concerns. The clinic phone number is [phone].  Please show the CHEMO ALERT CARD at check-in to the Emergency Department and triage nurse.   

## 2018-09-06 NOTE — Progress Notes (Signed)
Okay to treat today with ANC of 1.4 per Dr. Jana Hakim.

## 2018-09-13 ENCOUNTER — Inpatient Hospital Stay: Payer: BLUE CROSS/BLUE SHIELD

## 2018-09-13 ENCOUNTER — Other Ambulatory Visit: Payer: Self-pay

## 2018-09-13 ENCOUNTER — Other Ambulatory Visit: Payer: Self-pay | Admitting: Oncology

## 2018-09-13 DIAGNOSIS — Z171 Estrogen receptor negative status [ER-]: Secondary | ICD-10-CM

## 2018-09-13 DIAGNOSIS — Z5189 Encounter for other specified aftercare: Secondary | ICD-10-CM | POA: Diagnosis not present

## 2018-09-13 DIAGNOSIS — D701 Agranulocytosis secondary to cancer chemotherapy: Secondary | ICD-10-CM

## 2018-09-13 DIAGNOSIS — C50411 Malignant neoplasm of upper-outer quadrant of right female breast: Secondary | ICD-10-CM

## 2018-09-13 DIAGNOSIS — Z5111 Encounter for antineoplastic chemotherapy: Secondary | ICD-10-CM | POA: Diagnosis not present

## 2018-09-13 DIAGNOSIS — C773 Secondary and unspecified malignant neoplasm of axilla and upper limb lymph nodes: Secondary | ICD-10-CM | POA: Diagnosis not present

## 2018-09-13 DIAGNOSIS — T451X5A Adverse effect of antineoplastic and immunosuppressive drugs, initial encounter: Secondary | ICD-10-CM

## 2018-09-13 DIAGNOSIS — Z95828 Presence of other vascular implants and grafts: Secondary | ICD-10-CM

## 2018-09-13 LAB — CBC WITH DIFFERENTIAL/PLATELET
Abs Immature Granulocytes: 0 10*3/uL (ref 0.00–0.07)
BASOS PCT: 1 %
Basophils Absolute: 0 10*3/uL (ref 0.0–0.1)
Eosinophils Absolute: 0 10*3/uL (ref 0.0–0.5)
Eosinophils Relative: 0 %
HCT: 24.8 % — ABNORMAL LOW (ref 36.0–46.0)
Hemoglobin: 8.4 g/dL — ABNORMAL LOW (ref 12.0–15.0)
Immature Granulocytes: 0 %
Lymphocytes Relative: 36 %
Lymphs Abs: 0.6 10*3/uL — ABNORMAL LOW (ref 0.7–4.0)
MCH: 34.3 pg — ABNORMAL HIGH (ref 26.0–34.0)
MCHC: 33.9 g/dL (ref 30.0–36.0)
MCV: 101.2 fL — ABNORMAL HIGH (ref 80.0–100.0)
Monocytes Absolute: 0.2 10*3/uL (ref 0.1–1.0)
Monocytes Relative: 9 %
Neutro Abs: 1 10*3/uL — ABNORMAL LOW (ref 1.7–7.7)
Neutrophils Relative %: 54 %
Platelets: 118 10*3/uL — ABNORMAL LOW (ref 150–400)
RBC: 2.45 MIL/uL — ABNORMAL LOW (ref 3.87–5.11)
RDW: 13.9 % (ref 11.5–15.5)
WBC: 1.8 10*3/uL — AB (ref 4.0–10.5)
nRBC: 0 % (ref 0.0–0.2)

## 2018-09-13 LAB — COMPREHENSIVE METABOLIC PANEL
ALK PHOS: 69 U/L (ref 38–126)
ALT: 15 U/L (ref 0–44)
AST: 15 U/L (ref 15–41)
Albumin: 4.1 g/dL (ref 3.5–5.0)
Anion gap: 10 (ref 5–15)
BUN: 10 mg/dL (ref 6–20)
CO2: 25 mmol/L (ref 22–32)
Calcium: 9.1 mg/dL (ref 8.9–10.3)
Chloride: 105 mmol/L (ref 98–111)
Creatinine, Ser: 0.72 mg/dL (ref 0.44–1.00)
GFR calc Af Amer: >60 mL/min (ref >60)
GFR calc non Af Amer: >60 mL/min (ref >60)
Glucose, Bld: 104 mg/dL — ABNORMAL HIGH (ref 70–99)
Potassium: 4 mmol/L (ref 3.5–5.1)
Sodium: 140 mmol/L (ref 135–145)
Total Bilirubin: 0.9 mg/dL (ref 0.3–1.2)
Total Protein: 6.8 g/dL (ref 6.5–8.1)

## 2018-09-13 MED ORDER — SODIUM CHLORIDE 0.9% FLUSH
10.0000 mL | INTRAVENOUS | Status: DC | PRN
  Administered 2018-09-13: 10 mL
  Filled 2018-09-13: qty 10

## 2018-09-13 MED ORDER — DIPHENHYDRAMINE HCL 12.5 MG/5ML PO ELIX
12.5000 mg | ORAL_SOLUTION | Freq: Once | ORAL | Status: AC
  Administered 2018-09-13: 12.5 mg via ORAL

## 2018-09-13 MED ORDER — SODIUM CHLORIDE 0.9 % IV SOLN
Freq: Once | INTRAVENOUS | Status: AC
  Administered 2018-09-13: 14:00:00 via INTRAVENOUS
  Filled 2018-09-13: qty 250

## 2018-09-13 MED ORDER — PALONOSETRON HCL INJECTION 0.25 MG/5ML
INTRAVENOUS | Status: AC
  Filled 2018-09-13: qty 5

## 2018-09-13 MED ORDER — SODIUM CHLORIDE 0.9 % IV SOLN
20.0000 mg | Freq: Once | INTRAVENOUS | Status: AC
  Administered 2018-09-13: 20 mg via INTRAVENOUS
  Filled 2018-09-13: qty 2

## 2018-09-13 MED ORDER — HEPARIN SOD (PORK) LOCK FLUSH 100 UNIT/ML IV SOLN
500.0000 [IU] | Freq: Once | INTRAVENOUS | Status: AC | PRN
  Administered 2018-09-13: 500 [IU]
  Filled 2018-09-13: qty 5

## 2018-09-13 MED ORDER — SODIUM CHLORIDE 0.9 % IV SOLN
80.0000 mg/m2 | Freq: Once | INTRAVENOUS | Status: AC
  Administered 2018-09-13: 138 mg via INTRAVENOUS
  Filled 2018-09-13: qty 23

## 2018-09-13 MED ORDER — DIPHENHYDRAMINE HCL 12.5 MG/5ML PO ELIX
ORAL_SOLUTION | ORAL | Status: AC
  Filled 2018-09-13: qty 5

## 2018-09-13 MED ORDER — SODIUM CHLORIDE 0.9 % IV SOLN
243.0000 mg | Freq: Once | INTRAVENOUS | Status: AC
  Administered 2018-09-13: 240 mg via INTRAVENOUS
  Filled 2018-09-13: qty 24

## 2018-09-13 MED ORDER — DEXAMETHASONE SODIUM PHOSPHATE 10 MG/ML IJ SOLN
INTRAMUSCULAR | Status: AC
  Filled 2018-09-13: qty 1

## 2018-09-13 MED ORDER — ACETAMINOPHEN 325 MG PO TABS
ORAL_TABLET | ORAL | Status: AC
  Filled 2018-09-13: qty 2

## 2018-09-13 MED ORDER — ACETAMINOPHEN 325 MG PO TABS
650.0000 mg | ORAL_TABLET | Freq: Once | ORAL | Status: AC
  Administered 2018-09-13: 650 mg via ORAL

## 2018-09-13 MED ORDER — DEXAMETHASONE SODIUM PHOSPHATE 10 MG/ML IJ SOLN
4.0000 mg | Freq: Once | INTRAMUSCULAR | Status: AC
  Administered 2018-09-13: 4 mg via INTRAVENOUS

## 2018-09-13 MED ORDER — PALONOSETRON HCL INJECTION 0.25 MG/5ML
0.2500 mg | Freq: Once | INTRAVENOUS | Status: AC
  Administered 2018-09-13: 0.25 mg via INTRAVENOUS

## 2018-09-13 NOTE — Progress Notes (Signed)
Per Dr. Jana Hakim it is ok to treat today with ANC 1.0

## 2018-09-13 NOTE — Progress Notes (Signed)
Darlene Grant's ANC today was 1.0.  We are going to give her Neulasta and let her skip next week's treatment.  We suggested switching her treatments to earlier in the week when I will be available (we have a change schedule because of the current pandemic) but this would not be feasible for her.  We will do the best we can to see her either in person or by WebEx as planned previously.

## 2018-09-13 NOTE — Patient Instructions (Signed)
[facility] Discharge Instructions for Patients Receiving Chemotherapy  Today you received the following chemotherapy agents: Paclitaxel (Taxol) and Carboplatin (Paraplatin)  To help prevent nausea and vomiting after your treatment, we encourage you to take your nausea medication as directed.    If you develop nausea and vomiting that is not controlled by your nausea medication, call the clinic.   BELOW ARE SYMPTOMS THAT SHOULD BE REPORTED IMMEDIATELY:  *FEVER GREATER THAN 100.5 F  *CHILLS WITH OR WITHOUT FEVER  NAUSEA AND VOMITING THAT IS NOT CONTROLLED WITH YOUR NAUSEA MEDICATION  *UNUSUAL SHORTNESS OF BREATH  *UNUSUAL BRUISING OR BLEEDING  TENDERNESS IN MOUTH AND THROAT WITH OR WITHOUT PRESENCE OF ULCERS  *URINARY PROBLEMS  *BOWEL PROBLEMS  UNUSUAL RASH Items with * indicate a potential emergency and should be followed up as soon as possible.  Feel free to call the clinic should you have any questions or concerns. The clinic phone number is [phone].  Please show the CHEMO ALERT CARD at check-in to the Emergency Department and triage nurse.   

## 2018-09-16 ENCOUNTER — Other Ambulatory Visit: Payer: Self-pay | Admitting: Oncology

## 2018-09-16 ENCOUNTER — Other Ambulatory Visit: Payer: Self-pay | Admitting: *Deleted

## 2018-09-16 ENCOUNTER — Inpatient Hospital Stay: Payer: BLUE CROSS/BLUE SHIELD

## 2018-09-16 ENCOUNTER — Other Ambulatory Visit: Payer: Self-pay

## 2018-09-16 VITALS — BP 112/77 | HR 80 | Temp 98.5°F | Resp 18

## 2018-09-16 DIAGNOSIS — Z171 Estrogen receptor negative status [ER-]: Secondary | ICD-10-CM | POA: Diagnosis not present

## 2018-09-16 DIAGNOSIS — Z95828 Presence of other vascular implants and grafts: Secondary | ICD-10-CM

## 2018-09-16 DIAGNOSIS — C50411 Malignant neoplasm of upper-outer quadrant of right female breast: Secondary | ICD-10-CM | POA: Diagnosis not present

## 2018-09-16 DIAGNOSIS — Z5111 Encounter for antineoplastic chemotherapy: Secondary | ICD-10-CM | POA: Diagnosis not present

## 2018-09-16 DIAGNOSIS — C773 Secondary and unspecified malignant neoplasm of axilla and upper limb lymph nodes: Secondary | ICD-10-CM | POA: Diagnosis not present

## 2018-09-16 DIAGNOSIS — Z5189 Encounter for other specified aftercare: Secondary | ICD-10-CM | POA: Diagnosis not present

## 2018-09-16 MED ORDER — TBO-FILGRASTIM 480 MCG/0.8ML ~~LOC~~ SOSY
PREFILLED_SYRINGE | SUBCUTANEOUS | Status: AC
  Filled 2018-09-16: qty 0.8

## 2018-09-16 MED ORDER — PEGFILGRASTIM-CBQV 6 MG/0.6ML ~~LOC~~ SOSY
PREFILLED_SYRINGE | SUBCUTANEOUS | Status: AC
  Filled 2018-09-16: qty 0.6

## 2018-09-16 MED ORDER — PEGFILGRASTIM-CBQV 6 MG/0.6ML ~~LOC~~ SOSY: 6.0000 mg | PREFILLED_SYRINGE | Freq: Once | SUBCUTANEOUS | Status: DC

## 2018-09-16 MED ORDER — TBO-FILGRASTIM 480 MCG/0.8ML ~~LOC~~ SOSY
480.0000 ug | PREFILLED_SYRINGE | Freq: Once | SUBCUTANEOUS | Status: AC
  Administered 2018-09-16: 480 ug via SUBCUTANEOUS

## 2018-09-17 ENCOUNTER — Ambulatory Visit: Payer: BLUE CROSS/BLUE SHIELD | Admitting: Physical Therapy

## 2018-09-20 ENCOUNTER — Inpatient Hospital Stay (HOSPITAL_BASED_OUTPATIENT_CLINIC_OR_DEPARTMENT_OTHER): Payer: BLUE CROSS/BLUE SHIELD | Admitting: Adult Health

## 2018-09-20 ENCOUNTER — Inpatient Hospital Stay: Payer: BLUE CROSS/BLUE SHIELD

## 2018-09-20 ENCOUNTER — Telehealth: Payer: Self-pay | Admitting: Adult Health

## 2018-09-20 ENCOUNTER — Encounter: Payer: Self-pay | Admitting: Adult Health

## 2018-09-20 ENCOUNTER — Inpatient Hospital Stay: Payer: BLUE CROSS/BLUE SHIELD | Attending: Oncology

## 2018-09-20 ENCOUNTER — Other Ambulatory Visit: Payer: Self-pay

## 2018-09-20 VITALS — BP 103/66 | HR 89 | Temp 98.7°F | Resp 18 | Ht 64.25 in | Wt 143.0 lb

## 2018-09-20 DIAGNOSIS — Z5111 Encounter for antineoplastic chemotherapy: Secondary | ICD-10-CM | POA: Diagnosis not present

## 2018-09-20 DIAGNOSIS — K1231 Oral mucositis (ulcerative) due to antineoplastic therapy: Secondary | ICD-10-CM | POA: Insufficient documentation

## 2018-09-20 DIAGNOSIS — Z5189 Encounter for other specified aftercare: Secondary | ICD-10-CM | POA: Insufficient documentation

## 2018-09-20 DIAGNOSIS — C50411 Malignant neoplasm of upper-outer quadrant of right female breast: Secondary | ICD-10-CM | POA: Diagnosis not present

## 2018-09-20 DIAGNOSIS — C773 Secondary and unspecified malignant neoplasm of axilla and upper limb lymph nodes: Secondary | ICD-10-CM | POA: Insufficient documentation

## 2018-09-20 DIAGNOSIS — Z95828 Presence of other vascular implants and grafts: Secondary | ICD-10-CM

## 2018-09-20 DIAGNOSIS — Z171 Estrogen receptor negative status [ER-]: Secondary | ICD-10-CM | POA: Diagnosis not present

## 2018-09-20 DIAGNOSIS — R5383 Other fatigue: Secondary | ICD-10-CM

## 2018-09-20 LAB — CBC WITH DIFFERENTIAL/PLATELET
Abs Immature Granulocytes: 0.01 10*3/uL (ref 0.00–0.07)
Basophils Absolute: 0 10*3/uL (ref 0.0–0.1)
Basophils Relative: 1 %
Eosinophils Absolute: 0 10*3/uL (ref 0.0–0.5)
Eosinophils Relative: 1 %
HCT: 24.7 % — ABNORMAL LOW (ref 36.0–46.0)
Hemoglobin: 8.3 g/dL — ABNORMAL LOW (ref 12.0–15.0)
Immature Granulocytes: 1 %
Lymphocytes Relative: 34 %
Lymphs Abs: 0.7 10*3/uL (ref 0.7–4.0)
MCH: 34.2 pg — ABNORMAL HIGH (ref 26.0–34.0)
MCHC: 33.6 g/dL (ref 30.0–36.0)
MCV: 101.6 fL — ABNORMAL HIGH (ref 80.0–100.0)
Monocytes Absolute: 0.3 10*3/uL (ref 0.1–1.0)
Monocytes Relative: 13 %
Neutro Abs: 1 10*3/uL — ABNORMAL LOW (ref 1.7–7.7)
Neutrophils Relative %: 50 %
Platelets: 116 10*3/uL — ABNORMAL LOW (ref 150–400)
RBC: 2.43 MIL/uL — ABNORMAL LOW (ref 3.87–5.11)
RDW: 14.2 % (ref 11.5–15.5)
WBC: 2 10*3/uL — ABNORMAL LOW (ref 4.0–10.5)
nRBC: 0 % (ref 0.0–0.2)

## 2018-09-20 LAB — COMPREHENSIVE METABOLIC PANEL
ALT: 12 U/L (ref 0–44)
AST: 14 U/L — ABNORMAL LOW (ref 15–41)
Albumin: 4.2 g/dL (ref 3.5–5.0)
Alkaline Phosphatase: 70 U/L (ref 38–126)
Anion gap: 9 (ref 5–15)
BUN: 11 mg/dL (ref 6–20)
CO2: 25 mmol/L (ref 22–32)
Calcium: 9 mg/dL (ref 8.9–10.3)
Chloride: 104 mmol/L (ref 98–111)
Creatinine, Ser: 0.68 mg/dL (ref 0.44–1.00)
GFR calc Af Amer: >60 mL/min (ref >60)
GFR calc non Af Amer: >60 mL/min (ref >60)
Glucose, Bld: 97 mg/dL (ref 70–99)
Potassium: 4 mmol/L (ref 3.5–5.1)
Sodium: 138 mmol/L (ref 135–145)
Total Bilirubin: 1 mg/dL (ref 0.3–1.2)
Total Protein: 6.6 g/dL (ref 6.5–8.1)

## 2018-09-20 MED ORDER — DIPHENHYDRAMINE HCL 12.5 MG/5ML PO ELIX
12.5000 mg | ORAL_SOLUTION | Freq: Once | ORAL | Status: AC
  Administered 2018-09-20: 13:00:00 12.5 mg via ORAL

## 2018-09-20 MED ORDER — ACETAMINOPHEN 325 MG PO TABS
650.0000 mg | ORAL_TABLET | Freq: Once | ORAL | Status: AC
  Administered 2018-09-20: 650 mg via ORAL

## 2018-09-20 MED ORDER — SODIUM CHLORIDE 0.9 % IV SOLN
20.0000 mg | Freq: Once | INTRAVENOUS | Status: AC
  Administered 2018-09-20: 14:00:00 20 mg via INTRAVENOUS
  Filled 2018-09-20: qty 2

## 2018-09-20 MED ORDER — DEXAMETHASONE SODIUM PHOSPHATE 10 MG/ML IJ SOLN
INTRAMUSCULAR | Status: AC
  Filled 2018-09-20: qty 1

## 2018-09-20 MED ORDER — DEXAMETHASONE SODIUM PHOSPHATE 10 MG/ML IJ SOLN
4.0000 mg | Freq: Once | INTRAMUSCULAR | Status: AC
  Administered 2018-09-20: 4 mg via INTRAVENOUS

## 2018-09-20 MED ORDER — SODIUM CHLORIDE 0.9% FLUSH
10.0000 mL | INTRAVENOUS | Status: DC | PRN
  Administered 2018-09-20: 17:00:00 10 mL
  Filled 2018-09-20: qty 10

## 2018-09-20 MED ORDER — SODIUM CHLORIDE 0.9 % IV SOLN
80.0000 mg/m2 | Freq: Once | INTRAVENOUS | Status: AC
  Administered 2018-09-20: 138 mg via INTRAVENOUS
  Filled 2018-09-20: qty 23

## 2018-09-20 MED ORDER — SODIUM CHLORIDE 0.9 % IV SOLN
243.0000 mg | Freq: Once | INTRAVENOUS | Status: AC
  Administered 2018-09-20: 240 mg via INTRAVENOUS
  Filled 2018-09-20: qty 24

## 2018-09-20 MED ORDER — FAMOTIDINE IN NACL 20-0.9 MG/50ML-% IV SOLN: 20.0000 mg | Freq: Once | INTRAVENOUS | Status: DC

## 2018-09-20 MED ORDER — DIPHENHYDRAMINE HCL 12.5 MG/5ML PO ELIX
ORAL_SOLUTION | ORAL | Status: AC
  Filled 2018-09-20: qty 5

## 2018-09-20 MED ORDER — PALONOSETRON HCL INJECTION 0.25 MG/5ML
0.2500 mg | Freq: Once | INTRAVENOUS | Status: AC
  Administered 2018-09-20: 0.25 mg via INTRAVENOUS

## 2018-09-20 MED ORDER — ACETAMINOPHEN 325 MG PO TABS
ORAL_TABLET | ORAL | Status: AC
  Filled 2018-09-20: qty 2

## 2018-09-20 MED ORDER — PALONOSETRON HCL INJECTION 0.25 MG/5ML
INTRAVENOUS | Status: AC
  Filled 2018-09-20: qty 5

## 2018-09-20 MED ORDER — SODIUM CHLORIDE 0.9 % IV SOLN
Freq: Once | INTRAVENOUS | Status: AC
  Administered 2018-09-20: 13:00:00 via INTRAVENOUS
  Filled 2018-09-20: qty 250

## 2018-09-20 MED ORDER — HEPARIN SOD (PORK) LOCK FLUSH 100 UNIT/ML IV SOLN
500.0000 [IU] | Freq: Once | INTRAVENOUS | Status: AC | PRN
  Administered 2018-09-20: 500 [IU]
  Filled 2018-09-20: qty 5

## 2018-09-20 MED ORDER — SODIUM CHLORIDE 0.9% FLUSH
10.0000 mL | INTRAVENOUS | Status: DC | PRN
  Administered 2018-09-20: 10 mL
  Filled 2018-09-20: qty 10

## 2018-09-20 NOTE — Progress Notes (Signed)
NP Mendel Ryder reviewed pt's CBC and CMP today (09/20/2018), ok to treat today despite all abnormal values.

## 2018-09-20 NOTE — Telephone Encounter (Signed)
Received call from infusion nurse re granix injections for 4/7 and 4/8. Added appointments per 4/3 los and schedule message. Confirmed w/infsuion nurse patient will be given appointments while in infusion.

## 2018-09-20 NOTE — Progress Notes (Signed)
[facility]  [phone] [phone]    ID: Darlene Grant DOB: Nov 19, 1975  MR#: [id number]  FWY#:[id number]  Patient Care Team: Magrinat, Virgie Dad, MD as PCP - General (Oncology) Rolm Bookbinder, MD as Consulting Physician (General Surgery) Magrinat, Virgie Dad, MD as Consulting Physician (Oncology) Gery Pray, MD as Consulting Physician (Radiation Oncology) Emily Filbert, MD as Consulting Physician (Obstetrics and Gynecology) Lavonna Monarch, MD as Consulting Physician (Dermatology) OTHER MD:   CHIEF COMPLAINT: Triple negative breast cancer  CURRENT TREATMENT: Neoadjuvant chemotherapy   HISTORY OF CURRENT ILLNESS: From the original intake note:  Darlene Grant had routine screening mammography on 04/19/2018 showing a possible abnormality in the right breast. She underwent unilateral right diagnostic mammography with tomography and right breast ultrasonography at [facility] on 05/02/2018 showing: Breast Density Category C: There was an oval circumscribed mass in the posterior aspect of the upper outer right breast.  On exam this measured 3.5 cm, was firm, irregular and centered on 10-11 o'clock 5 cm from the nipple.  By ultrasonography there was a 3.7 x 1.7 x 1.5 cm elongated, irregular, hypoechoic mass in the 10:30 o'clock position of the right breast, 5 cm from the nipple.  Ultrasound of the right axilla showed 7 abnormal right axillary lymph nodes highly suspicious for metastatic nodes.  Accordingly on 05/03/2018 she proceeded to biopsy of the right breast area in question and one suspicious lymph node. The pathology from this procedure showed ([id number]): Right Breast: invasive mammary carcinoma, grade 3, E-cadherin positive (and therefore ductal) lymphovascular invasion present. Lymph node: Invasive mammary carcinoma Prognostic indicators significant for: estrogen receptor, 0% negative and progesterone receptor, 0%  negative. Proliferation marker Ki67 at 75%. HER2 negative (1+).  The patient's subsequent history is as detailed below.   INTERVAL HISTORY: Darlene Grant returns today for follow-up and treatment of her triple negative breast cancer.   She continues on neoadjuvant chemotherapy consisting of weekly paclitaxel and carboplatin x12. Today is day 1 cycle 6. Her nausea is better than in the past.    REVIEW OF SYSTEMS: Darlene Grant is doing moderately well today.  She is fatigued.  She notes her fatigue is worse the Saturday and Sunday following her treatment.  She says she stays in the bed a lot on those days.  On Mondays she starts feeling better, and then Tuesdays through Thursdays she is feeling better.   Darlene Grant denies nausea, vomiting, bowel/bladder changes.  She has no peripheral neuropathy.  She has no cough, shortness of breath, chest pain, palpitations.  A detailed ROS was otherwise non contributory.     PAST MEDICAL HISTORY: Past Medical History:  Diagnosis Date  . Abnormal Pap smear    ASCUS 03/13/11  . Anemia   . Breast pain, left   . Cancer ([place])   . Candida vaginitis   . Complication of anesthesia    pt states that all narcotics make her angry and she would prefer to avoid them.  . Rectal itching   . Vaginal Pap smear, abnormal     PAST SURGICAL HISTORY: Past Surgical History:  Procedure Laterality Date  . BREAST BIOPSY Right 8/16  . CESAREAN SECTION N/A 11/21/2013   Procedure: CESAREAN SECTION;  Surgeon: Alwyn Pea, MD;  Location: [facility];  Service: Obstetrics;  Laterality: N/A;  . CESAREAN SECTION N/A 06/23/2016   Procedure: CESAREAN SECTION;  Surgeon: Emily Filbert, MD;  Location: [facility];  Service: Obstetrics;  Laterality: N/A;  . HAND SURGERY  04/2012   ligament repair  . PILONIDAL CYST EXCISION  2000  . PORTACATH PLACEMENT N/A 05/28/2018   Procedure: INSERTION PORT-A-CATH WITH ULTRASOUND;  Surgeon: Rolm Bookbinder, MD;  Location: [facility];   Service: General;  Laterality: N/A;  . WISDOM TOOTH EXTRACTION  AGE 89    FAMILY HISTORY: Family History  Problem Relation Age of Onset  . Cancer Mother 44       breast, premenopausal  . Breast cancer Mother   . Cancer Maternal Grandmother        skin, stomach  . Cancer Maternal Grandfather        lung   As of November 2019 her father is 21 years old with a history of Parkinson's. Patients' mother is 42 years old with a history of breast cancer, diagnosed age 15, treated with radical mastectomy. The patient has no brothers and two sisters. Besides the patient's mother, the patient has no other family medical history of breast or ovarian cancer. Her maternal grandmother had stomach cancer and her maternal grandfather had lung cancer, --he was a smoker.    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  Her husband, [name], an Chief Financial Officer. She has two children, [name], age 14, and [name], age 22 months.  The patient is not a smoker. She does drink alcohol and drinks an estimated one to two drinks per week.     ADVANCED DIRECTIVES: [name], her husband, is her healthcare department   HEALTH MAINTENANCE: Social History   Tobacco Use  . Smoking status: Never Smoker  . Smokeless tobacco: Never Used  Substance Use Topics  . Alcohol use: Yes    Alcohol/week: 0.0 standard drinks    Comment: occassionally  . Drug use: No    Colonoscopy: no  PAP: 12/2017  Bone density: no   Allergies  Allergen Reactions  . Augmentin [Amoxicillin-Pot Clavulanate]     diarrhea  . Hydrocodone Itching  . Keflex [Cephalexin] Diarrhea    Current Outpatient Medications  Medication Sig Dispense Refill  . ciprofloxacin (CIPRO) 500 MG tablet  Take one tablet by mouth 2 times daily for 5 days after each chemo cycle, starting on day 8 of chemo 40 tablet 1  . cyclobenzaprine (FLEXERIL) 10 MG tablet Take 0.5-1 tablets (5-10 mg total) by mouth 3 (three) times daily as needed for muscle spasms. 30 tablet 1  . dexamethasone (DECADRON) 4 MG tablet Take 2 tablets by mouth once a day on the day after chemotherapy and then take 2 tablets two times a day for 2 days. Take with food. 30 tablet 1  . famotidine (PEPCID) 20 MG tablet Take 1 tablet (20 mg total) by mouth 2 (two) times daily.    . fluticasone (FLONASE) 50 MCG/ACT nasal spray Place 2 sprays into both nostrils daily. 16 g 2  . lidocaine-prilocaine (EMLA) cream Apply to affected area once 30 g 3  . loratadine (CLARITIN) 10 MG tablet Take 10 mg by mouth daily as needed for allergies.    Marland Kitchen LORazepam (ATIVAN) 0.5 MG tablet Take 1 tablet (0.5 mg total) by mouth at bedtime as needed (Nausea  or vomiting). 30 tablet 0  . polyethylene glycol (MIRALAX) 0.34 gm/ml SOLN     . Probiotic Product (PROBIOTIC DAILY PO) Take 1 tablet by mouth.    . prochlorperazine (COMPAZINE) 10 MG tablet Take 1 tablet (10 mg total) by mouth every 6 (six) hours as needed (Nausea or vomiting). 30 tablet 1  . sucralfate (CARAFATE) 1 g tablet Take 1 tablet (1 g total) by mouth 4 (four) times daily -  with meals and at bedtime. 40 tablet 0   No current facility-administered medications for this visit.    Facility-Administered Medications Ordered in Other Visits  Medication Dose Route Frequency Provider Last Rate Last Dose  . CARBOplatin (PARAPLATIN) 240 mg in sodium chloride 0.9 % 250 mL chemo infusion  240 mg Intravenous Once Magrinat, Virgie Dad, MD      . heparin lock flush 100 unit/mL  500 Units Intracatheter Once PRN Magrinat, Virgie Dad, MD      . PACLitaxel (TAXOL) 138 mg in sodium chloride 0.9 % 250 mL chemo infusion (</= '80mg'$ /m2)  80 mg/m2 (Treatment Plan Recorded) Intravenous Once Chauncey Cruel, MD 273 mL/hr at  09/20/18 1437 138 mg at 09/20/18 1437  . sodium chloride flush (NS) 0.9 % injection 10 mL  10 mL Intracatheter PRN Magrinat, Virgie Dad, MD         OBJECTIVE:  Vitals:   09/20/18 1155  BP: 103/66  Pulse: 89  Resp: 18  Temp: 98.7 F (37.1 C)  SpO2: 100%     Body mass index is 24.36 kg/m.   Wt Readings from Last 3 Encounters:  09/20/18 143 lb (64.9 kg)  09/06/18 142 lb 9.6 oz (64.7 kg)  08/30/18 143 lb (64.9 kg)  ECOG FS:1 GENERAL: Patient is a well appearing female in no acute distress HEENT:  Sclerae anicteric.  Oropharynx clear and moist. No ulcerations or evidence of oropharyngeal candidiasis. Neck is supple.  NODES:  No cervical, supraclavicular, or axillary lymphadenopathy palpated.  BREAST EXAM:  Small mobile 0.5cm area at left breast close to chest wall LUNGS:  Clear to auscultation bilaterally.  No wheezes or rhonchi. HEART:  Regular rate and rhythm. No murmur appreciated. ABDOMEN:  Soft, nontender.  Positive, normoactive bowel sounds. No organomegaly palpated. MSK:  No focal spinal tenderness to palpation. Full range of motion bilaterally in the upper extremities. EXTREMITIES:  No peripheral edema.   SKIN:  Clear with no obvious rashes or skin changes. No nail dyscrasia. NEURO:  Nonfocal. Well oriented.  Appropriate affect.    LAB RESULTS:  CMP     Component Value Date/Time   NA 138 09/20/2018 1135   K 4.0 09/20/2018 1135   CL 104 09/20/2018 1135   CO2 25 09/20/2018 1135   GLUCOSE 97 09/20/2018 1135   BUN 11 09/20/2018 1135   CREATININE 0.68 09/20/2018 1135   CREATININE 0.76 05/15/2018 0827   CREATININE 0.78 01/15/2018 1033   CALCIUM 9.0 09/20/2018 1135   PROT 6.6 09/20/2018 1135   ALBUMIN 4.2 09/20/2018 1135   AST 14 (L) 09/20/2018 1135   AST 10 (L) 05/15/2018 0827   ALT 12 09/20/2018 1135   ALT 9 05/15/2018 0827   ALKPHOS 70 09/20/2018 1135   BILITOT 1.0 09/20/2018 1135   BILITOT 1.1 05/15/2018 0827   GFRNONAA >60 09/20/2018 1135   GFRNONAA >60  05/15/2018 0827   GFRNONAA 95 01/15/2018 1033   GFRAA >60 09/20/2018 1135   GFRAA >60 05/15/2018 0827   GFRAA 110 01/15/2018 1033    No  results found for: TOTALPROTELP, ALBUMINELP, A1GS, A2GS, BETS, BETA2SER, GAMS, MSPIKE, SPEI  No results found for: KPAFRELGTCHN, LAMBDASER, [facility]  Lab Results  Component Value Date   WBC 2.0 (L) 09/20/2018   NEUTROABS 1.0 (L) 09/20/2018   HGB 8.3 (L) 09/20/2018   HCT 24.7 (L) 09/20/2018   MCV 101.6 (H) 09/20/2018   PLT 116 (L) 09/20/2018    '@LASTCHEMISTRY'$ @  No results found for: LABCA2  No components found for: AGTXMI680  No results for input(s): INR in the last 168 hours.  No results found for: LABCA2  No results found for: HOZ224  No results found for: MGN003  No results found for: BCW888  No results found for: CA2729  No components found for: HGQUANT  No results found for: CEA1 / No results found for: CEA1   No results found for: AFPTUMOR  No results found for: CHROMOGRNA  No results found for: PSA1  Appointment on 09/20/2018  Component Date Value Ref Range Status  . Sodium 09/20/2018 138  135 - 145 mmol/L Final  . Potassium 09/20/2018 4.0  3.5 - 5.1 mmol/L Final  . Chloride 09/20/2018 104  98 - 111 mmol/L Final  . CO2 09/20/2018 25  22 - 32 mmol/L Final  . Glucose, Bld 09/20/2018 97  70 - 99 mg/dL Final  . BUN 09/20/2018 11  6 - 20 mg/dL Final  . Creatinine, Ser 09/20/2018 0.68  0.44 - 1.00 mg/dL Final  . Calcium 09/20/2018 9.0  8.9 - 10.3 mg/dL Final  . Total Protein 09/20/2018 6.6  6.5 - 8.1 g/dL Final  . Albumin 09/20/2018 4.2  3.5 - 5.0 g/dL Final  . AST 09/20/2018 14* 15 - 41 U/L Final  . ALT 09/20/2018 12  0 - 44 U/L Final  . Alkaline Phosphatase 09/20/2018 70  38 - 126 U/L Final  . Total Bilirubin 09/20/2018 1.0  0.3 - 1.2 mg/dL Final  . GFR calc non Af Amer 09/20/2018 >60  >60 mL/min Final  . GFR calc Af Amer 09/20/2018 >60  >60 mL/min Final  . Anion gap 09/20/2018 9  5 - 15 Final   Performed at  [facility], [place] [address] [place], [place] [address]  . WBC 09/20/2018 2.0* 4.0 - 10.5 K/uL Final  . RBC 09/20/2018 2.43* 3.87 - 5.11 MIL/uL Final  . Hemoglobin 09/20/2018 8.3* 12.0 - 15.0 g/dL Final  . HCT 09/20/2018 24.7* 36.0 - 46.0 % Final  . MCV 09/20/2018 101.6* 80.0 - 100.0 fL Final  . MCH 09/20/2018 34.2* 26.0 - 34.0 pg Final  . MCHC 09/20/2018 33.6  30.0 - 36.0 g/dL Final  . RDW 09/20/2018 14.2  11.5 - 15.5 % Final  . Platelets 09/20/2018 116* 150 - 400 K/uL Final  . nRBC 09/20/2018 0.0  0.0 - 0.2 % Final  . Neutrophils Relative % 09/20/2018 50  % Final  . Neutro Abs 09/20/2018 1.0* 1.7 - 7.7 K/uL Final  . Lymphocytes Relative 09/20/2018 34  % Final  . Lymphs Abs 09/20/2018 0.7  0.7 - 4.0 K/uL Final  . Monocytes Relative 09/20/2018 13  % Final  . Monocytes Absolute 09/20/2018 0.3  0.1 - 1.0 K/uL Final  . Eosinophils Relative 09/20/2018 1  % Final  . Eosinophils Absolute 09/20/2018 0.0  0.0 - 0.5 K/uL Final  . Basophils Relative 09/20/2018 1  % Final  . Basophils Absolute 09/20/2018 0.0  0.0 - 0.1 K/uL Final  . Immature Granulocytes 09/20/2018 1  % Final  . Abs Immature  Granulocytes 09/20/2018 0.01  0.00 - 0.07 K/uL Final   Performed at [facility], [place] [address], [place], [place] [address]    (this displays the last labs from the last 3 days)  No results found for: TOTALPROTELP, ALBUMINELP, A1GS, A2GS, BETS, BETA2SER, GAMS, MSPIKE, SPEI (this displays SPEP labs)  No results found for: KPAFRELGTCHN, LAMBDASER, KAPLAMBRATIO (kappa/lambda light chains)  No results found for: HGBA, HGBA2QUANT, HGBFQUANT, HGBSQUAN (Hemoglobinopathy evaluation)   No results found for: LDH  Lab Results  Component Value Date   IRON 64 07/15/2018   TIBC 265 07/15/2018   IRONPCTSAT 24 07/15/2018   (Iron and TIBC)  Lab Results  Component Value Date   FERRITIN 92 07/15/2018    Urinalysis    Component Value Date/Time    COLORURINE YELLOW 12/18/2016 [place] 12/18/2016 1607   LABSPEC 1.010 12/18/2016 1607   PHURINE 7.0 12/18/2016 1607   GLUCOSEU NEGATIVE 12/18/2016 1607   HGBUR NEGATIVE 12/18/2016 1607   HGBUR negative 01/13/2010 0940   BILIRUBINUR NEGATIVE 12/18/2016 1607   BILIRUBINUR neg 05/17/2015 [place] 12/18/2016 1607   PROTEINUR NEGATIVE 12/18/2016 1607   UROBILINOGEN 0.2 05/17/2015 1034   UROBILINOGEN 0.2 01/13/2010 0940   NITRITE NEGATIVE 12/18/2016 1607   LEUKOCYTESUR NEGATIVE 12/18/2016 1607    STUDIES:  No results found.   ELIGIBLE FOR AVAILABLE RESEARCH PROTOCOL: S 1418   ASSESSMENT: 43 y.o. [place] woman, status post right breast upper outer quadrant and right axillary lymph node biopsy 05/03/2018 for a clinical T2 N2, stage IIIC invasive ductal carcinoma, triple negative, with an MIB-1 of 75%  (a) staging studies with CT scans of the chest abdomen and pelvis on 05/29/18 show no distant metastases; bone scan shows questionable focus of tracer accumulation on the proximal to mid right femoral diaphysis  (b) MRI breast on 05/31/18 shows additional non mass like enhancement throughout upper outer right breast that extends 5cm inferomedially from biopsy proven malignancy.    (1) genetics testing 06/06/2018--at [facility]--negative 11 gene panel   (2) neoadjuvant chemotherapy consisting of doxorubicin and cyclophosphamide in dose dense fashion x4 started 06/03/2018, completed 07/15/2018, followed by paclitaxel and carboplatin weekly x12 starting 08/02/2018 (a) Echo on 05/23/2018 shows an EF of 60-65%.  (3) definitive surgery to follow (patient is planning on bilateral mastectomies with no reconstruction)  (4) adjuvant radiation to follow surgery  (5) may be a candidate for S 1418  (6) consider adjuvant capecitabine depending on final surgical results   PLAN: Darlene Grant is doing well today.  She is tolerating chemotherapy relatively  well.  Her [place] is 1 again today and she received Granix on Monday after her treatment x 1.  I suggested that we move it to Tuesday and Wed, prior to her Friday treatment and adjusted the orders accordingly.    Darlene Grant is tired from the chemotherapy.  With her [place] being 1 still, and her hemoglobin 8.3 and plt count of 116, I offered her a week long break from chemotherapy.  She would like to push through and continue if possible.  Hilary and I reviewed her fatigue.  She will exercise when she can.  She will return on Tuesday and Wednesday for Granix, and on Friday for labs and treatment.  She knows to call for any other issue that may develop before the next visit.  A total of (30) minutes of face-to-face time was spent with this patient with greater than 50% of that time in  counseling and care-coordination.  Wilber Bihari, NP 09/20/18 2:48 PM Medical Oncology and Hematology [facility] [address] [place], [place] [address] Tel. [phone]    Fax. [phone]

## 2018-09-20 NOTE — Patient Instructions (Signed)
[facility] Discharge Instructions for Patients Receiving Chemotherapy  Today you received the following chemotherapy agents Paclitaxel (TAXOL) & Carboplatin (PARAPLATIN).  To help prevent nausea and vomiting after your treatment, we encourage you to take your nausea medication as prescribed.   If you develop nausea and vomiting that is not controlled by your nausea medication, call the clinic.   BELOW ARE SYMPTOMS THAT SHOULD BE REPORTED IMMEDIATELY:  *FEVER GREATER THAN 100.5 F  *CHILLS WITH OR WITHOUT FEVER  NAUSEA AND VOMITING THAT IS NOT CONTROLLED WITH YOUR NAUSEA MEDICATION  *UNUSUAL SHORTNESS OF BREATH  *UNUSUAL BRUISING OR BLEEDING  TENDERNESS IN MOUTH AND THROAT WITH OR WITHOUT PRESENCE OF ULCERS  *URINARY PROBLEMS  *BOWEL PROBLEMS  UNUSUAL RASH Items with * indicate a potential emergency and should be followed up as soon as possible.  Feel free to call the clinic should you have any questions or concerns. The clinic phone number is [phone].  Please show the CHEMO ALERT CARD at check-in to the Emergency Department and triage nurse.  Coronavirus (COVID-19) Are you at risk?  Are you at risk for the Coronavirus (COVID-19)?  To be considered HIGH RISK for Coronavirus (COVID-19), you have to meet the following criteria:  . Traveled to [place], [place], [place], [place] or [place]; or in the [place] to [place], [place], [place], or [place]; and have fever, cough, and shortness of breath within the last 2 weeks of travel OR . Been in close contact with a person diagnosed with COVID-19 within the last 2 weeks and have fever, cough, and shortness of breath . IF YOU DO NOT MEET THESE CRITERIA, YOU ARE CONSIDERED LOW RISK FOR COVID-19.  What to do if you are HIGH RISK for COVID-19?  . If you are having a medical emergency, call 911. . Seek medical care right away. Before you go to a doctor's office, urgent care or emergency department,  call ahead and tell them about your recent travel, contact with someone diagnosed with COVID-19, and your symptoms. You should receive instructions from your physician's office regarding next steps of care.  . When you arrive at healthcare provider, tell the healthcare staff immediately you have returned from visiting [place], [place], [place], [place] or [place]; or traveled in the [place] to [place], [place], [place], or [place]; in the last two weeks or you have been in close contact with a person diagnosed with COVID-19 in the last 2 weeks.   . Tell the health care staff about your symptoms: fever, cough and shortness of breath. . After you have been seen by a medical provider, you will be either: o Tested for (COVID-19) and discharged home on quarantine except to seek medical care if symptoms worsen, and asked to  - Stay home and avoid contact with others until you get your results (4-5 days)  - Avoid travel on public transportation if possible (such as bus, train, or airplane) or o Sent to the Emergency Department by EMS for evaluation, COVID-19 testing, and possible admission depending on your condition and test results.  What to do if you are LOW RISK for COVID-19?  Reduce your risk of any infection by using the same precautions used for avoiding the common cold or flu:  . Wash your hands often with soap and warm water for at least 20 seconds.  If soap and water are not readily available, use an alcohol-based hand sanitizer with at least 60% alcohol.  .   If coughing or sneezing, cover your mouth and nose by coughing or sneezing into the elbow areas of your shirt or coat, into a tissue or into your sleeve (not your hands). . Avoid shaking hands with others and consider head nods or verbal greetings only. . Avoid touching your eyes, nose, or mouth with unwashed hands.  . Avoid close contact with people who are sick. . Avoid places or events with large numbers of people in one  location, like concerts or sporting events. . Carefully consider travel plans you have or are making. . If you are planning any travel outside or inside the US, visit the CDC's Travelers' Health webpage for the latest health notices. . If you have some symptoms but not all symptoms, continue to monitor at home and seek medical attention if your symptoms worsen. . If you are having a medical emergency, call 911.   ADDITIONAL HEALTHCARE OPTIONS FOR PATIENTS  [facility] / e-Visit: [email or web address]         [facility]: [phone]  [facility]: [phone]                   [facility]: [phone]   

## 2018-09-23 ENCOUNTER — Telehealth: Payer: Self-pay | Admitting: Physical Therapy

## 2018-09-23 ENCOUNTER — Telehealth: Payer: Self-pay | Admitting: *Deleted

## 2018-09-23 ENCOUNTER — Encounter: Payer: Self-pay | Admitting: Adult Health

## 2018-09-23 NOTE — Telephone Encounter (Signed)
Called to check in with pt.  She has an appt. The April 15th that we will cancel. 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  She wants to be called when we reopen to be scheduled for a visit at that time  Gave pt our office number and she will call if she has any questions before then.  Maudry Diego, PT 09/23/2018 @ 11:37 AM

## 2018-09-23 NOTE — Telephone Encounter (Signed)
Medical records faxed to [facility]; [place] [phone]

## 2018-09-24 ENCOUNTER — Inpatient Hospital Stay: Payer: BLUE CROSS/BLUE SHIELD

## 2018-09-24 ENCOUNTER — Encounter: Payer: Self-pay | Admitting: Adult Health

## 2018-09-24 ENCOUNTER — Other Ambulatory Visit: Payer: Self-pay

## 2018-09-24 VITALS — BP 110/74 | HR 85 | Temp 98.1°F | Resp 18

## 2018-09-24 DIAGNOSIS — C50411 Malignant neoplasm of upper-outer quadrant of right female breast: Secondary | ICD-10-CM | POA: Diagnosis not present

## 2018-09-24 DIAGNOSIS — Z171 Estrogen receptor negative status [ER-]: Secondary | ICD-10-CM | POA: Diagnosis not present

## 2018-09-24 DIAGNOSIS — C773 Secondary and unspecified malignant neoplasm of axilla and upper limb lymph nodes: Secondary | ICD-10-CM | POA: Diagnosis not present

## 2018-09-24 DIAGNOSIS — K1231 Oral mucositis (ulcerative) due to antineoplastic therapy: Secondary | ICD-10-CM | POA: Diagnosis not present

## 2018-09-24 DIAGNOSIS — Z5189 Encounter for other specified aftercare: Secondary | ICD-10-CM | POA: Diagnosis not present

## 2018-09-24 DIAGNOSIS — Z95828 Presence of other vascular implants and grafts: Secondary | ICD-10-CM

## 2018-09-24 DIAGNOSIS — Z5111 Encounter for antineoplastic chemotherapy: Secondary | ICD-10-CM | POA: Diagnosis not present

## 2018-09-24 DIAGNOSIS — R5383 Other fatigue: Secondary | ICD-10-CM | POA: Diagnosis not present

## 2018-09-24 MED ORDER — TBO-FILGRASTIM 480 MCG/0.8ML ~~LOC~~ SOSY
PREFILLED_SYRINGE | SUBCUTANEOUS | Status: AC
  Filled 2018-09-24: qty 0.8

## 2018-09-24 MED ORDER — TBO-FILGRASTIM 480 MCG/0.8ML ~~LOC~~ SOSY
480.0000 ug | PREFILLED_SYRINGE | Freq: Once | SUBCUTANEOUS | Status: AC
  Administered 2018-09-24: 15:00:00 480 ug via SUBCUTANEOUS

## 2018-09-24 NOTE — Patient Instructions (Signed)
Tbo-Filgrastim injection What is this medicine? TBO-FILGRASTIM (T B O fil GRA stim) is a granulocyte colony-stimulating factor that stimulates the growth of neutrophils, a type of white blood cell important in the body's fight against infection. It is used to reduce the incidence of fever and infection in patients with certain types of cancer who are receiving chemotherapy that affects the bone marrow. This medicine may be used for other purposes; ask your health care provider or pharmacist if you have questions. COMMON BRAND NAME(S): Granix What should I tell my health care provider before I take this medicine? They need to know if you have any of these conditions: -bone scan or tests planned -kidney disease -sickle cell anemia -an unusual or allergic reaction to tbo-filgrastim, filgrastim, pegfilgrastim, other medicines, foods, dyes, or preservatives -pregnant or trying to get pregnant -breast-feeding How should I use this medicine? This medicine is for injection under the skin. If you get this medicine at home, you will be taught how to prepare and give this medicine. Refer to the Instructions for Use that come with your medication packaging. Use exactly as directed. Take your medicine at regular intervals. Do not take your medicine more often than directed. It is important that you put your used needles and syringes in a special sharps container. Do not put them in a trash can. If you do not have a sharps container, call your pharmacist or healthcare provider to get one. Talk to your pediatrician regarding the use of this medicine in children. While this drug may be prescribed for children as young as 1 month of age for selected conditions, precautions do apply. Overdosage: If you think you have taken too much of this medicine contact a poison control center or emergency room at once. NOTE: This medicine is only for you. Do not share this medicine with others. What if I miss a dose? It is  important not to miss your dose. Call your doctor or health care professional if you miss a dose. What may interact with this medicine? This medicine may interact with the following medications: -medicines that may cause a release of neutrophils, such as lithium This list may not describe all possible interactions. Give your health care provider a list of all the medicines, herbs, non-prescription drugs, or dietary supplements you use. Also tell them if you smoke, drink alcohol, or use illegal drugs. Some items may interact with your medicine. What should I watch for while using this medicine? You may need blood work done while you are taking this medicine. What side effects may I notice from receiving this medicine? Side effects that you should report to your doctor or health care professional as soon as possible: -allergic reactions like skin rash, itching or hives, swelling of the face, lips, or tongue -back pain -blood in the urine -dark urine -dizziness -fast heartbeat -feeling faint -shortness of breath or breathing problems -signs and symptoms of infection like fever or chills; cough; or sore throat -signs and symptoms of kidney injury like trouble passing urine or change in the amount of urine -stomach or side pain, or pain at the shoulder -sweating -swelling of the legs, ankles, or abdomen -tiredness Side effects that usually do not require medical attention (report to your doctor or health care professional if they continue or are bothersome): -bone pain -diarrhea -headache -muscle pain -vomiting This list may not describe all possible side effects. Call your doctor for medical advice about side effects. You may report side effects to FDA at   1-800-FDA-1088. Where should I keep my medicine? Keep out of the reach of children. Store in a refrigerator between 2 and 8 degrees C (36 and 46 degrees F). Keep in carton to protect from light. Throw away this medicine if it is left out  of the refrigerator for more than 5 consecutive days. Throw away any unused medicine after the expiration date. NOTE: This sheet is a summary. It may not cover all possible information. If you have questions about this medicine, talk to your doctor, pharmacist, or health care provider.  2019 Elsevier/Gold Standard (2017-01-23 16:56:18)  

## 2018-09-25 ENCOUNTER — Inpatient Hospital Stay: Payer: BLUE CROSS/BLUE SHIELD

## 2018-09-25 ENCOUNTER — Other Ambulatory Visit: Payer: Self-pay

## 2018-09-25 DIAGNOSIS — C773 Secondary and unspecified malignant neoplasm of axilla and upper limb lymph nodes: Secondary | ICD-10-CM | POA: Diagnosis not present

## 2018-09-25 DIAGNOSIS — Z171 Estrogen receptor negative status [ER-]: Secondary | ICD-10-CM | POA: Diagnosis not present

## 2018-09-25 DIAGNOSIS — K1231 Oral mucositis (ulcerative) due to antineoplastic therapy: Secondary | ICD-10-CM | POA: Diagnosis not present

## 2018-09-25 DIAGNOSIS — Z5189 Encounter for other specified aftercare: Secondary | ICD-10-CM | POA: Diagnosis not present

## 2018-09-25 DIAGNOSIS — C50411 Malignant neoplasm of upper-outer quadrant of right female breast: Secondary | ICD-10-CM | POA: Diagnosis not present

## 2018-09-25 DIAGNOSIS — R5383 Other fatigue: Secondary | ICD-10-CM | POA: Diagnosis not present

## 2018-09-25 DIAGNOSIS — Z95828 Presence of other vascular implants and grafts: Secondary | ICD-10-CM

## 2018-09-25 DIAGNOSIS — Z5111 Encounter for antineoplastic chemotherapy: Secondary | ICD-10-CM | POA: Diagnosis not present

## 2018-09-25 MED ORDER — TBO-FILGRASTIM 480 MCG/0.8ML ~~LOC~~ SOSY
PREFILLED_SYRINGE | SUBCUTANEOUS | Status: AC
  Filled 2018-09-25: qty 0.8

## 2018-09-25 MED ORDER — TBO-FILGRASTIM 480 MCG/0.8ML ~~LOC~~ SOSY
480.0000 ug | PREFILLED_SYRINGE | Freq: Once | SUBCUTANEOUS | Status: AC
  Administered 2018-09-25: 14:00:00 480 ug via SUBCUTANEOUS

## 2018-09-25 NOTE — Patient Instructions (Signed)
Tbo-Filgrastim injection What is this medicine? TBO-FILGRASTIM (T B O fil GRA stim) is a granulocyte colony-stimulating factor that stimulates the growth of neutrophils, a type of white blood cell important in the body's fight against infection. It is used to reduce the incidence of fever and infection in patients with certain types of cancer who are receiving chemotherapy that affects the bone marrow. This medicine may be used for other purposes; ask your health care provider or pharmacist if you have questions. COMMON BRAND NAME(S): Granix What should I tell my health care provider before I take this medicine? They need to know if you have any of these conditions: -bone scan or tests planned -kidney disease -sickle cell anemia -an unusual or allergic reaction to tbo-filgrastim, filgrastim, pegfilgrastim, other medicines, foods, dyes, or preservatives -pregnant or trying to get pregnant -breast-feeding How should I use this medicine? This medicine is for injection under the skin. If you get this medicine at home, you will be taught how to prepare and give this medicine. Refer to the Instructions for Use that come with your medication packaging. Use exactly as directed. Take your medicine at regular intervals. Do not take your medicine more often than directed. It is important that you put your used needles and syringes in a special sharps container. Do not put them in a trash can. If you do not have a sharps container, call your pharmacist or healthcare provider to get one. Talk to your pediatrician regarding the use of this medicine in children. While this drug may be prescribed for children as young as 1 month of age for selected conditions, precautions do apply. Overdosage: If you think you have taken too much of this medicine contact a poison control center or emergency room at once. NOTE: This medicine is only for you. Do not share this medicine with others. What if I miss a dose? It is  important not to miss your dose. Call your doctor or health care professional if you miss a dose. What may interact with this medicine? This medicine may interact with the following medications: -medicines that may cause a release of neutrophils, such as lithium This list may not describe all possible interactions. Give your health care provider a list of all the medicines, herbs, non-prescription drugs, or dietary supplements you use. Also tell them if you smoke, drink alcohol, or use illegal drugs. Some items may interact with your medicine. What should I watch for while using this medicine? You may need blood work done while you are taking this medicine. What side effects may I notice from receiving this medicine? Side effects that you should report to your doctor or health care professional as soon as possible: -allergic reactions like skin rash, itching or hives, swelling of the face, lips, or tongue -back pain -blood in the urine -dark urine -dizziness -fast heartbeat -feeling faint -shortness of breath or breathing problems -signs and symptoms of infection like fever or chills; cough; or sore throat -signs and symptoms of kidney injury like trouble passing urine or change in the amount of urine -stomach or side pain, or pain at the shoulder -sweating -swelling of the legs, ankles, or abdomen -tiredness Side effects that usually do not require medical attention (report to your doctor or health care professional if they continue or are bothersome): -bone pain -diarrhea -headache -muscle pain -vomiting This list may not describe all possible side effects. Call your doctor for medical advice about side effects. You may report side effects to FDA at   1-800-FDA-1088. Where should I keep my medicine? Keep out of the reach of children. Store in a refrigerator between 2 and 8 degrees C (36 and 46 degrees F). Keep in carton to protect from light. Throw away this medicine if it is left out  of the refrigerator for more than 5 consecutive days. Throw away any unused medicine after the expiration date. NOTE: This sheet is a summary. It may not cover all possible information. If you have questions about this medicine, talk to your doctor, pharmacist, or health care provider.  2019 Elsevier/Gold Standard (2017-01-23 16:56:18)  

## 2018-09-26 ENCOUNTER — Encounter: Payer: Self-pay | Admitting: Adult Health

## 2018-09-27 ENCOUNTER — Other Ambulatory Visit: Payer: Self-pay

## 2018-09-27 ENCOUNTER — Other Ambulatory Visit: Payer: Self-pay | Admitting: Medical

## 2018-09-27 ENCOUNTER — Inpatient Hospital Stay: Payer: BLUE CROSS/BLUE SHIELD

## 2018-09-27 ENCOUNTER — Inpatient Hospital Stay (HOSPITAL_BASED_OUTPATIENT_CLINIC_OR_DEPARTMENT_OTHER): Payer: BLUE CROSS/BLUE SHIELD | Admitting: Medical

## 2018-09-27 VITALS — BP 109/72 | HR 83 | Temp 98.9°F | Resp 18

## 2018-09-27 DIAGNOSIS — K1230 Oral mucositis (ulcerative), unspecified: Secondary | ICD-10-CM

## 2018-09-27 DIAGNOSIS — K1231 Oral mucositis (ulcerative) due to antineoplastic therapy: Secondary | ICD-10-CM | POA: Diagnosis not present

## 2018-09-27 DIAGNOSIS — R5383 Other fatigue: Secondary | ICD-10-CM | POA: Diagnosis not present

## 2018-09-27 DIAGNOSIS — Z171 Estrogen receptor negative status [ER-]: Secondary | ICD-10-CM | POA: Diagnosis not present

## 2018-09-27 DIAGNOSIS — C50411 Malignant neoplasm of upper-outer quadrant of right female breast: Secondary | ICD-10-CM

## 2018-09-27 DIAGNOSIS — Z95828 Presence of other vascular implants and grafts: Secondary | ICD-10-CM

## 2018-09-27 DIAGNOSIS — C773 Secondary and unspecified malignant neoplasm of axilla and upper limb lymph nodes: Secondary | ICD-10-CM | POA: Diagnosis not present

## 2018-09-27 DIAGNOSIS — Z5189 Encounter for other specified aftercare: Secondary | ICD-10-CM | POA: Diagnosis not present

## 2018-09-27 DIAGNOSIS — Z5111 Encounter for antineoplastic chemotherapy: Secondary | ICD-10-CM | POA: Diagnosis not present

## 2018-09-27 LAB — COMPREHENSIVE METABOLIC PANEL
ALT: 13 U/L (ref 0–44)
AST: 14 U/L — ABNORMAL LOW (ref 15–41)
Albumin: 4.4 g/dL (ref 3.5–5.0)
Alkaline Phosphatase: 82 U/L (ref 38–126)
Anion gap: 12 (ref 5–15)
BUN: 9 mg/dL (ref 6–20)
CO2: 26 mmol/L (ref 22–32)
Calcium: 9.4 mg/dL (ref 8.9–10.3)
Chloride: 102 mmol/L (ref 98–111)
Creatinine, Ser: 0.71 mg/dL (ref 0.44–1.00)
GFR calc Af Amer: >60 mL/min (ref >60)
GFR calc non Af Amer: >60 mL/min (ref >60)
Glucose, Bld: 91 mg/dL (ref 70–99)
Potassium: 3.9 mmol/L (ref 3.5–5.1)
Sodium: 140 mmol/L (ref 135–145)
Total Bilirubin: 0.8 mg/dL (ref 0.3–1.2)
Total Protein: 6.9 g/dL (ref 6.5–8.1)

## 2018-09-27 LAB — CBC WITH DIFFERENTIAL/PLATELET
Abs Immature Granulocytes: 0.08 10*3/uL — ABNORMAL HIGH (ref 0.00–0.07)
Basophils Absolute: 0 10*3/uL (ref 0.0–0.1)
Basophils Relative: 0 %
Eosinophils Absolute: 0 10*3/uL (ref 0.0–0.5)
Eosinophils Relative: 0 %
HCT: 23.8 % — ABNORMAL LOW (ref 36.0–46.0)
Hemoglobin: 8.1 g/dL — ABNORMAL LOW (ref 12.0–15.0)
Immature Granulocytes: 1 %
Lymphocytes Relative: 10 %
Lymphs Abs: 0.7 10*3/uL (ref 0.7–4.0)
MCH: 34.8 pg — ABNORMAL HIGH (ref 26.0–34.0)
MCHC: 34 g/dL (ref 30.0–36.0)
MCV: 102.1 fL — ABNORMAL HIGH (ref 80.0–100.0)
Monocytes Absolute: 0.4 10*3/uL (ref 0.1–1.0)
Monocytes Relative: 6 %
Neutro Abs: 5.9 10*3/uL (ref 1.7–7.7)
Neutrophils Relative %: 83 %
Platelets: 130 10*3/uL — ABNORMAL LOW (ref 150–400)
RBC: 2.33 MIL/uL — ABNORMAL LOW (ref 3.87–5.11)
RDW: 15.1 % (ref 11.5–15.5)
WBC: 7.1 10*3/uL (ref 4.0–10.5)
nRBC: 0 % (ref 0.0–0.2)

## 2018-09-27 MED ORDER — LIDOCAINE VISCOUS HCL 2 % MT SOLN
15.0000 mL | Freq: Once | OROMUCOSAL | Status: DC
  Filled 2018-09-27: qty 15

## 2018-09-27 MED ORDER — SODIUM CHLORIDE 0.9 % IV SOLN
20.0000 mg | Freq: Once | INTRAVENOUS | Status: AC
  Administered 2018-09-27: 20 mg via INTRAVENOUS
  Filled 2018-09-27: qty 2

## 2018-09-27 MED ORDER — SODIUM CHLORIDE 0.9 % IV SOLN
Freq: Once | INTRAVENOUS | Status: AC
  Administered 2018-09-27: 13:00:00 via INTRAVENOUS
  Filled 2018-09-27: qty 250

## 2018-09-27 MED ORDER — PALONOSETRON HCL INJECTION 0.25 MG/5ML
INTRAVENOUS | Status: AC
  Filled 2018-09-27: qty 5

## 2018-09-27 MED ORDER — ACYCLOVIR 5 % EX CREA: 1.0000 "application " | TOPICAL_CREAM | CUTANEOUS | 2 refills | Status: DC

## 2018-09-27 MED ORDER — ALUM & MAG HYDROXIDE-SIMETH 200-200-20 MG/5ML PO SUSP: 30.0000 mL | Freq: Once | ORAL | Status: DC

## 2018-09-27 MED ORDER — SODIUM CHLORIDE 0.9 % IV SOLN
80.0000 mg/m2 | Freq: Once | INTRAVENOUS | Status: AC
  Administered 2018-09-27: 15:00:00 138 mg via INTRAVENOUS
  Filled 2018-09-27: qty 23

## 2018-09-27 MED ORDER — LIDOCAINE VISCOUS HCL 2 % MT SOLN: 5.0000 mL | OROMUCOSAL | 1 refills | Status: DC | PRN

## 2018-09-27 MED ORDER — ACETAMINOPHEN 325 MG PO TABS
ORAL_TABLET | ORAL | Status: AC
  Filled 2018-09-27: qty 2

## 2018-09-27 MED ORDER — SODIUM CHLORIDE 0.9% FLUSH
10.0000 mL | INTRAVENOUS | Status: DC | PRN
  Administered 2018-09-27: 10 mL
  Filled 2018-09-27: qty 10

## 2018-09-27 MED ORDER — FAMOTIDINE IN NACL 20-0.9 MG/50ML-% IV SOLN: 20.0000 mg | Freq: Once | INTRAVENOUS | Status: DC

## 2018-09-27 MED ORDER — LIDOCAINE VISCOUS HCL 2 % MT SOLN
15.0000 mL | Freq: Once | OROMUCOSAL | Status: AC
  Administered 2018-09-27: 14:00:00 15 mL via ORAL
  Filled 2018-09-27: qty 15

## 2018-09-27 MED ORDER — SODIUM CHLORIDE 0.9 % IV SOLN
243.0000 mg | Freq: Once | INTRAVENOUS | Status: AC
  Administered 2018-09-27: 240 mg via INTRAVENOUS
  Filled 2018-09-27: qty 24

## 2018-09-27 MED ORDER — ACETAMINOPHEN 325 MG PO TABS
650.0000 mg | ORAL_TABLET | Freq: Once | ORAL | Status: AC
  Administered 2018-09-27: 13:00:00 650 mg via ORAL

## 2018-09-27 MED ORDER — DEXAMETHASONE SODIUM PHOSPHATE 10 MG/ML IJ SOLN
INTRAMUSCULAR | Status: AC
  Filled 2018-09-27: qty 1

## 2018-09-27 MED ORDER — HEPARIN SOD (PORK) LOCK FLUSH 100 UNIT/ML IV SOLN
500.0000 [IU] | Freq: Once | INTRAVENOUS | Status: AC | PRN
  Administered 2018-09-27: 16:00:00 500 [IU]
  Filled 2018-09-27: qty 5

## 2018-09-27 MED ORDER — DEXAMETHASONE SODIUM PHOSPHATE 10 MG/ML IJ SOLN
4.0000 mg | Freq: Once | INTRAMUSCULAR | Status: AC
  Administered 2018-09-27: 4 mg via INTRAVENOUS

## 2018-09-27 MED ORDER — DIPHENHYDRAMINE HCL 12.5 MG/5ML PO ELIX
ORAL_SOLUTION | ORAL | Status: AC
  Filled 2018-09-27: qty 5

## 2018-09-27 MED ORDER — PALONOSETRON HCL INJECTION 0.25 MG/5ML
0.2500 mg | Freq: Once | INTRAVENOUS | Status: AC
  Administered 2018-09-27: 13:00:00 0.25 mg via INTRAVENOUS

## 2018-09-27 MED ORDER — DIPHENHYDRAMINE HCL 12.5 MG/5ML PO ELIX
12.5000 mg | ORAL_SOLUTION | Freq: Once | ORAL | Status: AC
  Administered 2018-09-27: 13:00:00 12.5 mg via ORAL

## 2018-09-27 MED ORDER — ALUM & MAG HYDROXIDE-SIMETH 200-200-20 MG/5ML PO SUSP
ORAL | Status: AC
  Filled 2018-09-27: qty 30

## 2018-09-27 MED ORDER — SODIUM CHLORIDE 0.9% FLUSH
10.0000 mL | INTRAVENOUS | Status: DC | PRN
  Administered 2018-09-27: 16:00:00 10 mL
  Filled 2018-09-27: qty 10

## 2018-09-27 NOTE — Patient Instructions (Signed)
   [facility] Discharge Instructions for Patients Receiving Chemotherapy  Today you received the following chemotherapy agents Taxol and Carboplatin   To help prevent nausea and vomiting after your treatment, we encourage you to take your nausea medication as directed.    If you develop nausea and vomiting that is not controlled by your nausea medication, call the clinic.   BELOW ARE SYMPTOMS THAT SHOULD BE REPORTED IMMEDIATELY:  *FEVER GREATER THAN 100.5 F  *CHILLS WITH OR WITHOUT FEVER  NAUSEA AND VOMITING THAT IS NOT CONTROLLED WITH YOUR NAUSEA MEDICATION  *UNUSUAL SHORTNESS OF BREATH  *UNUSUAL BRUISING OR BLEEDING  TENDERNESS IN MOUTH AND THROAT WITH OR WITHOUT PRESENCE OF ULCERS  *URINARY PROBLEMS  *BOWEL PROBLEMS  UNUSUAL RASH Items with * indicate a potential emergency and should be followed up as soon as possible.  Feel free to call the clinic should you have any questions or concerns. The clinic phone number is [phone].  Please show the CHEMO ALERT CARD at check-in to the Emergency Department and triage nurse.   

## 2018-09-30 NOTE — Progress Notes (Signed)
[facility] Progress Note   Darlene Grant [id number] [birth year] 43 y.o.  Darlene Grant is managed by Dr. Jana Hakim  Actively treated with chemotherapy/immunotherapy/hormonal therapy: yes  Current therapy: carboplatin and paclitaxel with Granix support  Last treated: 09/27/2018 (cycle 13, day 1)  Next scheduled appointment with provider: 10/04/2018  Assessment: Plan:    Mucositis due to antineoplastic therapy  Malignant neoplasm of upper-outer quadrant of right breast in female, estrogen receptor negative ([place])   Mucositis: The patient was given a prescription for viscous lidocaine and a acyclovir cream.  She was also given a GI cocktail today to swish for management of her oral pain.  ER negative malignant neoplasm of the right breast: The patient is receiving cycle 13, day 1 of carboplatin and paclitaxel with Granix support today.  She is scheduled to see Dr. Jana Hakim in follow-up on 10/04/2018.  Please see After Visit Summary for patient specific instructions.  Future Appointments  Date Time Provider [place]  10/01/2018  2:30 PM [facility] [place] [facility] None  10/02/2018 10:15 AM Tevis, Marcene Brawn R, PT [facility] None  10/02/2018 12:00 PM [facility] [place] [facility] None  10/04/2018  9:15 AM [facility] LAB 4 [facility] None  10/04/2018  9:30 AM [facility] [place] [facility] None  10/04/2018 10:00 AM Magrinat, Virgie Dad, MD [facility] None  10/04/2018 11:00 AM [facility] INFUSION [facility] None  10/08/2018  2:00 PM [facility] [place] [facility] None  10/11/2018 10:45 AM [facility] LAB 4 [facility] None  10/11/2018 11:00 AM [facility] [place] [facility] None  10/11/2018 11:30 AM Causey, Charlestine Massed, NP [facility] None  10/11/2018 12:30 PM [facility] INFUSION [facility] None  10/15/2018  1:00 PM [facility] [place] [facility]USH [facility] None  10/16/2018  1:15 PM [facility] [place] [facility] None  10/18/2018 10:45 AM [facility] LAB 5  [facility] None  10/18/2018 11:00 AM [facility] [place] [facility] None  10/18/2018 11:30 AM Causey, Charlestine Massed, NP [facility] None  10/18/2018 12:30 PM [facility] INFUSION [facility] None    No orders of the defined types were placed in this encounter.      Subjective:   Patient ID:  Darlene Grant is a 43 y.o. (DOB Feb 28, 1976) female.  Chief Complaint: No chief complaint on file.   HPI Darlene Grant is a 43 year old female with a history of a triple negative right breast cancer who is managed by Dr. Jana Hakim and is receiving cycle 13, day 1 of carboplatin and paclitaxel with Granix support.  She was seen in the infusion room today with report of mucositis.  She has multiple small areas in her lateral tongue and her left posterior mouth which are causing her pain with eating and drinking.  She denies fevers, chills, or sweats.  Medications: I have reviewed the patient's current medications.  Allergies:  Allergies  Allergen Reactions  . Augmentin [Amoxicillin-Pot Clavulanate]     diarrhea  . Hydrocodone Itching  . Keflex [Cephalexin] Diarrhea    Past Medical History:  Diagnosis Date  . Abnormal Pap smear    ASCUS 03/13/11  . Anemia   . Breast pain, left   . Cancer ([place])   . Candida vaginitis   . Complication of anesthesia    pt states that all narcotics make her angry and she would prefer to avoid them.  . Rectal itching   . Vaginal Pap smear, abnormal     Past Surgical History:  Procedure Laterality Date  . BREAST BIOPSY Right 8/16  . CESAREAN SECTION N/A 11/21/2013  Procedure: CESAREAN SECTION;  Surgeon: Alwyn Pea, MD;  Location: [facility];  Service: Obstetrics;  Laterality: N/A;  . CESAREAN SECTION N/A 06/23/2016   Procedure: CESAREAN SECTION;  Surgeon: Emily Filbert, MD;  Location: [facility];  Service: Obstetrics;  Laterality: N/A;  . HAND SURGERY  04/2012   ligament repair  . PILONIDAL CYST EXCISION  2000  . PORTACATH  PLACEMENT N/A 05/28/2018   Procedure: INSERTION PORT-A-CATH WITH ULTRASOUND;  Surgeon: Rolm Bookbinder, MD;  Location: [facility];  Service: General;  Laterality: N/A;  . WISDOM TOOTH EXTRACTION  AGE 18    Family History  Problem Relation Age of Onset  . Cancer Mother 31       breast, premenopausal  . Breast cancer Mother   . Cancer Maternal Grandmother        skin, stomach  . Cancer Maternal Grandfather        lung    Social History   Socioeconomic History  . Marital status: Married    Spouse name: Elberta Fortis  . Number of children: 2  . Years of education: Not on file  . Highest education level: Not on file  Occupational History  . Occupation: occupational therapist  Social Needs  . Financial resource strain: Not on file  . Food insecurity:    Worry: Not on file    Inability: Not on file  . Transportation needs:    Medical: Not on file    Non-medical: Not on file  Tobacco Use  . Smoking status: Never Smoker  . Smokeless tobacco: Never Used  Substance and Sexual Activity  . Alcohol use: Yes    Alcohol/week: 0.0 standard drinks    Comment: occassionally  . Drug use: No  . Sexual activity: Yes    Partners: Male    Birth control/protection: Condom  Lifestyle  . Physical activity:    Days per week: Not on file    Minutes per session: Not on file  . Stress: Not on file  Relationships  . Social connections:    Talks on phone: Not on file    Gets together: Not on file    Attends religious service: Not on file    Active member of club or organization: Not on file    Attends meetings of clubs or organizations: Not on file    Relationship status: Not on file  . Intimate partner violence:    Fear of current or ex partner: Not on file    Emotionally abused: Not on file    Physically abused: Not on file    Forced sexual activity: Not on file  Other Topics Concern  . Not on file  Social History Narrative  . Not on file    Past Medical History,  Surgical history, Social history, and Family history were reviewed and updated as appropriate.   Please see review of systems for further details on the patient's review from today.   Review of Systems:  Review of Systems  Constitutional: Positive for appetite change. Negative for chills, diaphoresis and fever.  HENT: Positive for mouth sores. Negative for sore throat and trouble swallowing.   Respiratory: Negative for cough, shortness of breath and wheezing.   Cardiovascular: Negative for chest pain and palpitations.  Gastrointestinal: Negative for constipation, diarrhea, nausea and vomiting.    Objective:   Physical Exam:  There were no vitals taken for this visit. ECOG: 1  Physical Exam Constitutional:      General: She is not  in acute distress.    Appearance: Normal appearance. She is not ill-appearing.  HENT:     Mouth/Throat:     Mouth: Mucous membranes are moist.     Comments: There are at least 2 lesions over the right lateral tongue left posterior tongue.  No erythema or exudate. Eyes:     General: No scleral icterus.       Right eye: No discharge.        Left eye: No discharge.     Conjunctiva/sclera: Conjunctivae normal.  Neurological:     Mental Status: She is alert.     Gait: Gait normal.  Psychiatric:        Mood and Affect: Mood normal.        Behavior: Behavior normal.        Thought Content: Thought content normal.        Judgment: Judgment normal.     Lab Review:     Component Value Date/Time   NA 140 09/27/2018 1159   K 3.9 09/27/2018 1159   CL 102 09/27/2018 1159   CO2 26 09/27/2018 1159   GLUCOSE 91 09/27/2018 1159   BUN 9 09/27/2018 1159   CREATININE 0.71 09/27/2018 1159   CREATININE 0.76 05/15/2018 0827   CREATININE 0.78 01/15/2018 1033   CALCIUM 9.4 09/27/2018 1159   PROT 6.9 09/27/2018 1159   ALBUMIN 4.4 09/27/2018 1159   AST 14 (L) 09/27/2018 1159   AST 10 (L) 05/15/2018 0827   ALT 13 09/27/2018 1159   ALT 9 05/15/2018 0827    ALKPHOS 82 09/27/2018 1159   BILITOT 0.8 09/27/2018 1159   BILITOT 1.1 05/15/2018 0827   GFRNONAA >60 09/27/2018 1159   GFRNONAA >60 05/15/2018 0827   GFRNONAA 95 01/15/2018 1033   GFRAA >60 09/27/2018 1159   GFRAA >60 05/15/2018 0827   GFRAA 110 01/15/2018 1033       Component Value Date/Time   WBC 7.1 09/27/2018 1159   RBC 2.33 (L) 09/27/2018 1159   HGB 8.1 (L) 09/27/2018 1159   HGB 12.1 05/15/2018 0827   HCT 23.8 (L) 09/27/2018 1159   PLT 130 (L) 09/27/2018 1159   PLT 259 05/15/2018 0827   MCV 102.1 (H) 09/27/2018 1159   MCH 34.8 (H) 09/27/2018 1159   MCHC 34.0 09/27/2018 1159   RDW 15.1 09/27/2018 1159   LYMPHSABS 0.7 09/27/2018 1159   MONOABS 0.4 09/27/2018 1159   EOSABS 0.0 09/27/2018 1159   BASOSABS 0.0 09/27/2018 1159   -------------------------------  Imaging from last 24 hours (if applicable):  Radiology interpretation: No results found.

## 2018-10-01 ENCOUNTER — Inpatient Hospital Stay: Payer: BLUE CROSS/BLUE SHIELD

## 2018-10-01 ENCOUNTER — Other Ambulatory Visit: Payer: Self-pay

## 2018-10-01 VITALS — BP 110/70 | HR 85 | Temp 98.9°F | Resp 18

## 2018-10-01 DIAGNOSIS — Z171 Estrogen receptor negative status [ER-]: Secondary | ICD-10-CM | POA: Diagnosis not present

## 2018-10-01 DIAGNOSIS — Z5189 Encounter for other specified aftercare: Secondary | ICD-10-CM | POA: Diagnosis not present

## 2018-10-01 DIAGNOSIS — K1231 Oral mucositis (ulcerative) due to antineoplastic therapy: Secondary | ICD-10-CM | POA: Diagnosis not present

## 2018-10-01 DIAGNOSIS — Z5111 Encounter for antineoplastic chemotherapy: Secondary | ICD-10-CM | POA: Diagnosis not present

## 2018-10-01 DIAGNOSIS — Z95828 Presence of other vascular implants and grafts: Secondary | ICD-10-CM

## 2018-10-01 DIAGNOSIS — R5383 Other fatigue: Secondary | ICD-10-CM | POA: Diagnosis not present

## 2018-10-01 DIAGNOSIS — C50411 Malignant neoplasm of upper-outer quadrant of right female breast: Secondary | ICD-10-CM | POA: Diagnosis not present

## 2018-10-01 DIAGNOSIS — C773 Secondary and unspecified malignant neoplasm of axilla and upper limb lymph nodes: Secondary | ICD-10-CM | POA: Diagnosis not present

## 2018-10-01 MED ORDER — TBO-FILGRASTIM 480 MCG/0.8ML ~~LOC~~ SOSY
480.0000 ug | PREFILLED_SYRINGE | Freq: Once | SUBCUTANEOUS | Status: AC
  Administered 2018-10-01: 480 ug via SUBCUTANEOUS

## 2018-10-01 MED ORDER — TBO-FILGRASTIM 480 MCG/0.8ML ~~LOC~~ SOSY
PREFILLED_SYRINGE | SUBCUTANEOUS | Status: AC
  Filled 2018-10-01: qty 0.8

## 2018-10-02 ENCOUNTER — Inpatient Hospital Stay: Payer: BLUE CROSS/BLUE SHIELD

## 2018-10-02 ENCOUNTER — Other Ambulatory Visit: Payer: Self-pay

## 2018-10-02 ENCOUNTER — Ambulatory Visit: Payer: BLUE CROSS/BLUE SHIELD | Admitting: Rehabilitation

## 2018-10-02 VITALS — BP 101/74 | HR 96 | Temp 97.1°F | Resp 16

## 2018-10-02 DIAGNOSIS — C773 Secondary and unspecified malignant neoplasm of axilla and upper limb lymph nodes: Secondary | ICD-10-CM | POA: Diagnosis not present

## 2018-10-02 DIAGNOSIS — Z5189 Encounter for other specified aftercare: Secondary | ICD-10-CM | POA: Diagnosis not present

## 2018-10-02 DIAGNOSIS — Z171 Estrogen receptor negative status [ER-]: Secondary | ICD-10-CM | POA: Diagnosis not present

## 2018-10-02 DIAGNOSIS — C50411 Malignant neoplasm of upper-outer quadrant of right female breast: Secondary | ICD-10-CM | POA: Diagnosis not present

## 2018-10-02 DIAGNOSIS — Z95828 Presence of other vascular implants and grafts: Secondary | ICD-10-CM

## 2018-10-02 DIAGNOSIS — Z5111 Encounter for antineoplastic chemotherapy: Secondary | ICD-10-CM | POA: Diagnosis not present

## 2018-10-02 DIAGNOSIS — R5383 Other fatigue: Secondary | ICD-10-CM | POA: Diagnosis not present

## 2018-10-02 DIAGNOSIS — K1231 Oral mucositis (ulcerative) due to antineoplastic therapy: Secondary | ICD-10-CM | POA: Diagnosis not present

## 2018-10-02 MED ORDER — TBO-FILGRASTIM 480 MCG/0.8ML ~~LOC~~ SOSY
PREFILLED_SYRINGE | SUBCUTANEOUS | Status: AC
  Filled 2018-10-02: qty 0.8

## 2018-10-02 MED ORDER — TBO-FILGRASTIM 480 MCG/0.8ML ~~LOC~~ SOSY
480.0000 ug | PREFILLED_SYRINGE | Freq: Once | SUBCUTANEOUS | Status: AC
  Administered 2018-10-02: 12:00:00 480 ug via SUBCUTANEOUS

## 2018-10-02 NOTE — Patient Instructions (Signed)
Tbo-Filgrastim injection What is this medicine? TBO-FILGRASTIM (T B O fil GRA stim) is a granulocyte colony-stimulating factor that stimulates the growth of neutrophils, a type of white blood cell important in the body's fight against infection. It is used to reduce the incidence of fever and infection in patients with certain types of cancer who are receiving chemotherapy that affects the bone marrow. This medicine may be used for other purposes; ask your health care provider or pharmacist if you have questions. COMMON BRAND NAME(S): Granix What should I tell my health care provider before I take this medicine? They need to know if you have any of these conditions: -bone scan or tests planned -kidney disease -sickle cell anemia -an unusual or allergic reaction to tbo-filgrastim, filgrastim, pegfilgrastim, other medicines, foods, dyes, or preservatives -pregnant or trying to get pregnant -breast-feeding How should I use this medicine? This medicine is for injection under the skin. If you get this medicine at home, you will be taught how to prepare and give this medicine. Refer to the Instructions for Use that come with your medication packaging. Use exactly as directed. Take your medicine at regular intervals. Do not take your medicine more often than directed. It is important that you put your used needles and syringes in a special sharps container. Do not put them in a trash can. If you do not have a sharps container, call your pharmacist or healthcare provider to get one. Talk to your pediatrician regarding the use of this medicine in children. While this drug may be prescribed for children as young as 1 month of age for selected conditions, precautions do apply. Overdosage: If you think you have taken too much of this medicine contact a poison control center or emergency room at once. NOTE: This medicine is only for you. Do not share this medicine with others. What if I miss a dose? It is  important not to miss your dose. Call your doctor or health care professional if you miss a dose. What may interact with this medicine? This medicine may interact with the following medications: -medicines that may cause a release of neutrophils, such as lithium This list may not describe all possible interactions. Give your health care provider a list of all the medicines, herbs, non-prescription drugs, or dietary supplements you use. Also tell them if you smoke, drink alcohol, or use illegal drugs. Some items may interact with your medicine. What should I watch for while using this medicine? You may need blood work done while you are taking this medicine. What side effects may I notice from receiving this medicine? Side effects that you should report to your doctor or health care professional as soon as possible: -allergic reactions like skin rash, itching or hives, swelling of the face, lips, or tongue -back pain -blood in the urine -dark urine -dizziness -fast heartbeat -feeling faint -shortness of breath or breathing problems -signs and symptoms of infection like fever or chills; cough; or sore throat -signs and symptoms of kidney injury like trouble passing urine or change in the amount of urine -stomach or side pain, or pain at the shoulder -sweating -swelling of the legs, ankles, or abdomen -tiredness Side effects that usually do not require medical attention (report to your doctor or health care professional if they continue or are bothersome): -bone pain -diarrhea -headache -muscle pain -vomiting This list may not describe all possible side effects. Call your doctor for medical advice about side effects. You may report side effects to FDA at   1-800-FDA-1088. Where should I keep my medicine? Keep out of the reach of children. Store in a refrigerator between 2 and 8 degrees C (36 and 46 degrees F). Keep in carton to protect from light. Throw away this medicine if it is left out  of the refrigerator for more than 5 consecutive days. Throw away any unused medicine after the expiration date. NOTE: This sheet is a summary. It may not cover all possible information. If you have questions about this medicine, talk to your doctor, pharmacist, or health care provider.  2019 Elsevier/Gold Standard (2017-01-23 16:56:18)  

## 2018-10-03 NOTE — Progress Notes (Signed)
[place]  [phone] [phone]    ID: Darlene Grant DOB: [birth year]  MR#: [id number]  TOI#:[id number]  Patient Care Team: , Virgie Dad, MD as PCP - General (Oncology) Rolm Bookbinder, MD as Consulting Physician (General Surgery) , Virgie Dad, MD as Consulting Physician (Oncology) Gery Pray, MD as Consulting Physician (Radiation Oncology) Emily Filbert, MD as Consulting Physician (Obstetrics and Gynecology) Lavonna Monarch, MD as Consulting Physician (Dermatology) Mauro Kaufmann, RN as Oncology Nurse Navigator Rockwell Germany, RN as Oncology Nurse Navigator OTHER MD:   CHIEF COMPLAINT: Triple negative breast cancer  CURRENT TREATMENT: Neoadjuvant chemotherapy   HISTORY OF CURRENT ILLNESS: .introg{From the original intake note:  Darlene Grant had routine screening mammography on 04/19/2018 showing a possible abnormality in the right breast. She underwent unilateral right diagnostic mammography with tomography and right breast ultrasonography at [facility] on 05/02/2018 showing: Breast Density Category C: There was an oval circumscribed mass in the posterior aspect of the upper outer right breast.  On exam this measured 3.5 cm, was firm, irregular and centered on 10-11 o'clock 5 cm from the nipple.  By ultrasonography there was a 3.7 x 1.7 x 1.5 cm elongated, irregular, hypoechoic mass in the 10:30 o'clock position of the right breast, 5 cm from the nipple.  Ultrasound of the right axilla showed 7 abnormal right axillary lymph nodes highly suspicious for metastatic nodes.  Accordingly on 05/03/2018 she proceeded to biopsy of the right breast area in question and one suspicious lymph node. The pathology from this procedure showed ([id number]): Right Breast: invasive mammary carcinoma, grade 3, E-cadherin positive (and therefore ductal) lymphovascular invasion present. Lymph node: Invasive mammary  carcinoma Prognostic indicators significant for: estrogen receptor, 0% negative and progesterone receptor, 0% negative. Proliferation marker Ki67 at 75%. HER2 negative (1+).  The patient's subsequent history is as detailed below.   INTERVAL HISTORY: Darlene Grant returns today for follow-up and treatment of her triple negative breast cancer.   She continues on neoadjuvant chemotherapy consisting of weekly paclitaxel and carboplatin x12. Today is day 1 cycle 10. She has some fatigue. She has some difficulty making a bowel movement immediately following treatment, which she treats by eating fiber and drinking a lot of fluids. She notes that she felt better with some extra fluids at her last treatment.   She has required Granix added 480 mcg on days 5 and 6 after each dose.  This has allowed [place] to keep her treatments on schedule and not to have to lower the dose.  Since her last visit here, she has not undergone any additional studies.     REVIEW OF SYSTEMS: Darlene Grant notes that her hair is growing. She notes that her daughter has been dealing with seasonal allergies, which is keeping Darlene Grant awake at night. Darlene Grant is staying in as much as possible. Her husband has gone to get groceries a few times, but he takes appropriate precautions to keep from spreading COVID-19. She does not have a formal exercise routine, but she continues to do chores around the house. She does like to walk. Darlene Grant has had some allergies; she gets slightly out of breath when she goes for walks outside. The patient denies unusual headaches, visual changes, nausea, vomiting, or dizziness. There has been no unusual cough, phlegm production, or pleurisy. This been no change in bowel or bladder habits. The patient denies unexplained weight loss, bleeding, rash, or fever. A detailed review of systems was otherwise noncontributory.  PAST MEDICAL HISTORY: Past Medical History:  Diagnosis Date  . Abnormal Pap smear    ASCUS 03/13/11   . Anemia   . Breast pain, left   . Cancer ([place])   . Candida vaginitis   . Complication of anesthesia    pt states that all narcotics make her angry and she would prefer to avoid them.  . Rectal itching   . Vaginal Pap smear, abnormal     PAST SURGICAL HISTORY: Past Surgical History:  Procedure Laterality Date  . BREAST BIOPSY Right 8/16  . CESAREAN SECTION N/A 11/21/2013   Procedure: CESAREAN SECTION;  Surgeon: Alwyn Pea, MD;  Location: [facility];  Service: Obstetrics;  Laterality: N/A;  . CESAREAN SECTION N/A 06/23/2016   Procedure: CESAREAN SECTION;  Surgeon: Emily Filbert, MD;  Location: [facility];  Service: Obstetrics;  Laterality: N/A;  . HAND SURGERY  04/2012   ligament repair  . PILONIDAL CYST EXCISION  2000  . PORTACATH PLACEMENT N/A 05/28/2018   Procedure: INSERTION PORT-A-CATH WITH ULTRASOUND;  Surgeon: Rolm Bookbinder, MD;  Location: [facility];  Service: General;  Laterality: N/A;  . WISDOM TOOTH EXTRACTION  AGE 25    FAMILY HISTORY: Family History  Problem Relation Age of Onset  . Cancer Mother 24       breast, premenopausal  . Breast cancer Mother   . Cancer Maternal Grandmother        skin, stomach  . Cancer Maternal Grandfather        lung   As of November 2019 her father is 60 years old with a history of Parkinson's. Patients' mother is 71 years old with a history of breast cancer, diagnosed age 70, treated with radical mastectomy. The patient has no brothers and two sisters. Besides the patient's mother, the patient has no other family medical history of breast or ovarian cancer. Her maternal grandmother had stomach cancer and her maternal grandfather had lung cancer, --he was a smoker.    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  Her husband, Darlene Grant, an Chief Financial Officer. She has two children, Darlene Grant, age 60, and Darlene Grant, age 9 months.  The patient is not a smoker. She does drink alcohol and drinks an estimated one to two drinks per week.     ADVANCED DIRECTIVES: Darlene Grant, her husband, is her healthcare department   HEALTH MAINTENANCE: Social History   Tobacco Use  . Smoking status: Never Smoker  . Smokeless tobacco: Never Used  Substance Use Topics  . Alcohol use: Yes    Alcohol/week: 0.0 standard drinks    Comment: occassionally  . Drug use: No    Colonoscopy: no  PAP: 12/2017  Bone density: no   Allergies  Allergen Reactions  . Augmentin [Amoxicillin-Pot Clavulanate]     diarrhea  . Hydrocodone Itching  . Keflex [Cephalexin] Diarrhea    Current Outpatient Medications  Medication Sig Dispense Refill  . acyclovir cream (ZOVIRAX) 5 % Apply 1 application topically every 3 (three) hours. 15 g 2  . ciprofloxacin (CIPRO) 500 MG tablet Take one tablet by mouth 2 times daily for  5 days after each chemo cycle, starting on day 8 of chemo 40 tablet 1  . cyclobenzaprine (FLEXERIL) 10 MG tablet Take 0.5-1 tablets (5-10 mg total) by mouth 3 (three) times daily as needed for muscle spasms. 30 tablet 1  . dexamethasone (DECADRON) 4 MG tablet Take 2 tablets by mouth once a day on the day after chemotherapy and then take 2 tablets two times a day for 2 days. Take with food. 30 tablet 1  . famotidine (PEPCID) 20 MG tablet Take 1 tablet (20 mg total) by mouth 2 (two) times daily.    . fluticasone (FLONASE) 50 MCG/ACT nasal spray Place 2 sprays into both nostrils daily. 16 g 2  . lidocaine (XYLOCAINE) 2 % solution Use as directed 5 mLs in the mouth or throat every 3 (three) hours as needed for mouth pain. 200 mL 1  . lidocaine-prilocaine (EMLA) cream Apply to affected area once 30 g 3  . loratadine (CLARITIN) 10 MG tablet Take 10 mg by mouth daily as needed for  allergies.    Marland Kitchen LORazepam (ATIVAN) 0.5 MG tablet Take 1 tablet (0.5 mg total) by mouth at bedtime as needed (Nausea or vomiting). 30 tablet 0  . polyethylene glycol (MIRALAX) 0.34 gm/ml SOLN     . Probiotic Product (PROBIOTIC DAILY PO) Take 1 tablet by mouth.    . prochlorperazine (COMPAZINE) 10 MG tablet Take 1 tablet (10 mg total) by mouth every 6 (six) hours as needed (Nausea or vomiting). 30 tablet 1  . sucralfate (CARAFATE) 1 g tablet Take 1 tablet (1 g total) by mouth 4 (four) times daily -  with meals and at bedtime. 40 tablet 0   No current facility-administered medications for this visit.    Facility-Administered Medications Ordered in Other Visits  Medication Dose Route Frequency Provider Last Rate Last Dose  . CARBOplatin (PARAPLATIN) 240 mg in sodium chloride 0.9 % 250 mL chemo infusion  240 mg Intravenous Once Magrinat, Virgie Dad, MD      . famotidine (PEPCID) 20 mg in sodium chloride 0.9 % 100 mL IVPB  20 mg Intravenous Once Magrinat, Virgie Dad, MD 408 mL/hr at 10/04/18 1135 20 mg at 10/04/18 1135  . heparin lock flush 100 unit/mL  500 Units Intracatheter Once PRN Magrinat, Virgie Dad, MD      . PACLitaxel (TAXOL) 138 mg in sodium chloride 0.9 % 250 mL chemo infusion (</= 34m/m2)  80 mg/m2 (Treatment Plan Recorded) Intravenous Once Magrinat, GVirgie Dad MD      . sodium chloride flush (NS) 0.9 % injection 10 mL  10 mL Intracatheter PRN Magrinat, GVirgie Dad MD         OBJECTIVE: Young white woman in no acute distress Vitals:   10/04/18 1012  BP: 119/76  Pulse: 85  Resp: 19  Temp: 98.7 F (37.1 C)  SpO2: 100%     Body mass index is 24.65 kg/m.   Wt Readings from Last 3 Encounters:  10/04/18 143 lb 9.6 oz (65.1 kg)  09/20/18 143 lb (64.9 kg)  09/06/18 142 lb 9.6 oz (64.7 kg)  ECOG FS: 0 - Asymptomatic  Sclerae unicteric, EOMs intact No cervical or supraclavicular adenopathy Lungs no rales or rhonchi Heart regular rate and rhythm Abd soft, nontender, positive bowel sounds  MSK no focal spinal tenderness, no upper extremity lymphedema Neuro: nonfocal, well oriented, appropriate affect Breasts: Deferred  LAB RESULTS:  CMP     Component Value Date/Time   NA 139 10/04/2018 1000   K  4.0 10/04/2018 1000   CL 105 10/04/2018 1000   CO2 26 10/04/2018 1000   GLUCOSE 101 (H) 10/04/2018 1000   BUN 10 10/04/2018 1000   CREATININE 0.67 10/04/2018 1000   CREATININE 0.76 05/15/2018 0827   CREATININE 0.78 01/15/2018 1033   CALCIUM 9.0 10/04/2018 1000   PROT 6.6 10/04/2018 1000   ALBUMIN 3.9 10/04/2018 1000   AST 13 (L) 10/04/2018 1000   AST 10 (L) 05/15/2018 0827   ALT 10 10/04/2018 1000   ALT 9 05/15/2018 0827   ALKPHOS 82 10/04/2018 1000   BILITOT 0.7 10/04/2018 1000   BILITOT 1.1 05/15/2018 0827   GFRNONAA >60 10/04/2018 1000   GFRNONAA >60 05/15/2018 0827   GFRNONAA 95 01/15/2018 1033   GFRAA >60 10/04/2018 1000   GFRAA >60 05/15/2018 0827   GFRAA 110 01/15/2018 1033    No results found for: TOTALPROTELP, ALBUMINELP, A1GS, A2GS, BETS, BETA2SER, GAMS, MSPIKE, SPEI  No results found for: KPAFRELGTCHN, LAMBDASER, KAPLAMBRATIO  Lab Results  Component Value Date   WBC 5.9 10/04/2018   NEUTROABS 4.8 10/04/2018   HGB 8.0 (L) 10/04/2018   HCT 24.2 (L) 10/04/2018   MCV 105.7 (H) 10/04/2018   PLT 127 (L) 10/04/2018    _0 @  No results found for: LABCA2  No components found for: [id number]  No results for input(s): INR in the last 168 hours.  No results found for: LABCA2  No results found for: CVE938  No results found for: BOF751  No results found for: WCH852  No results found for: CA2729  No components found for: HGQUANT  No results found for: CEA1 / No results found for: CEA1   No results found for: AFPTUMOR  No results found for: CHROMOGRNA  No results found for: PSA1  Appointment on 10/04/2018  Component Date Value Ref Range Status  . Sodium 10/04/2018 139  135 - 145 mmol/L Final  . Potassium 10/04/2018 4.0  3.5 -  5.1 mmol/L Final  . Chloride 10/04/2018 105  98 - 111 mmol/L Final  . CO2 10/04/2018 26  22 - 32 mmol/L Final  . Glucose, Bld 10/04/2018 101* 70 - 99 mg/dL Final  . BUN 10/04/2018 10  6 - 20 mg/dL Final  . Creatinine, Ser 10/04/2018 0.67  0.44 - 1.00 mg/dL Final  . Calcium 10/04/2018 9.0  8.9 - 10.3 mg/dL Final  . Total Protein 10/04/2018 6.6  6.5 - 8.1 g/dL Final  . Albumin 10/04/2018 3.9  3.5 - 5.0 g/dL Final  . AST 10/04/2018 13* 15 - 41 U/L Final  . ALT 10/04/2018 10  0 - 44 U/L Final  . Alkaline Phosphatase 10/04/2018 82  38 - 126 U/L Final  . Total Bilirubin 10/04/2018 0.7  0.3 - 1.2 mg/dL Final  . GFR calc non Af Amer 10/04/2018 >60  >60 mL/min Final  . GFR calc Af Amer 10/04/2018 >60  >60 mL/min Final  . Anion gap 10/04/2018 8  5 - 15 Final   Performed at [facility], [place] [address], [place], [place] [address]  . WBC 10/04/2018 5.9  4.0 - 10.5 K/uL Final  . RBC 10/04/2018 2.29* 3.87 - 5.11 MIL/uL Final  . Hemoglobin 10/04/2018 8.0* 12.0 - 15.0 g/dL Final  . HCT 10/04/2018 24.2* 36.0 - 46.0 % Final  . MCV 10/04/2018 105.7* 80.0 - 100.0 fL Final  . MCH 10/04/2018 34.9* 26.0 - 34.0 pg Final  . MCHC 10/04/2018 33.1  30.0 - 36.0 g/dL Final  . RDW 10/04/2018  15.8* 11.5 - 15.5 % Final  . Platelets 10/04/2018 127* 150 - 400 K/uL Final  . nRBC 10/04/2018 0.0  0.0 - 0.2 % Final  . Neutrophils Relative % 10/04/2018 82  % Final  . Neutro Abs 10/04/2018 4.8  1.7 - 7.7 K/uL Final  . Lymphocytes Relative 10/04/2018 11  % Final  . Lymphs Abs 10/04/2018 0.7  0.7 - 4.0 K/uL Final  . Monocytes Relative 10/04/2018 6  % Final  . Monocytes Absolute 10/04/2018 0.3  0.1 - 1.0 K/uL Final  . Eosinophils Relative 10/04/2018 0  % Final  . Eosinophils Absolute 10/04/2018 0.0  0.0 - 0.5 K/uL Final  . Basophils Relative 10/04/2018 0  % Final  . Basophils Absolute 10/04/2018 0.0  0.0 - 0.1 K/uL Final  . Immature Granulocytes 10/04/2018 1  % Final  . Abs Immature Granulocytes  10/04/2018 0.04  0.00 - 0.07 K/uL Final  . Ovalocytes 10/04/2018 PRESENT   Final   Performed at [facility], [address] [place], [place] [address]    (this displays the last labs from the last 3 days)  No results found for: TOTALPROTELP, ALBUMINELP, A1GS, A2GS, BETS, BETA2SER, GAMS, MSPIKE, SPEI (this displays SPEP labs)  No results found for: KPAFRELGTCHN, LAMBDASER, KAPLAMBRATIO (kappa/lambda light chains)  No results found for: HGBA, HGBA2QUANT, HGBFQUANT, HGBSQUAN (Hemoglobinopathy evaluation)   No results found for: LDH  Lab Results  Component Value Date   IRON 64 07/15/2018   TIBC 265 07/15/2018   IRONPCTSAT 24 07/15/2018   (Iron and TIBC)  Lab Results  Component Value Date   FERRITIN 92 07/15/2018    Urinalysis    Component Value Date/Time   COLORURINE YELLOW 12/18/2016 [place] 12/18/2016 1607   LABSPEC 1.010 12/18/2016 1607   PHURINE 7.0 12/18/2016 [place] 12/18/2016 1607   HGBUR NEGATIVE 12/18/2016 1607   HGBUR negative 01/13/2010 0940   BILIRUBINUR NEGATIVE 12/18/2016 1607   BILIRUBINUR neg 05/17/2015 [place] 12/18/2016 1607   PROTEINUR NEGATIVE 12/18/2016 1607   UROBILINOGEN 0.2 05/17/2015 1034   UROBILINOGEN 0.2 01/13/2010 0940   NITRITE NEGATIVE 12/18/2016 1607   LEUKOCYTESUR NEGATIVE 12/18/2016 1607    STUDIES:  Preop MRI to be scheduled  ELIGIBLE FOR AVAILABLE RESEARCH PROTOCOL: S 1418   ASSESSMENT: 43 y.o. [place] woman, status post right breast upper outer quadrant and right axillary lymph node biopsy 05/03/2018 for a clinical T2 N2, stage IIIC invasive ductal carcinoma, triple negative, with an MIB-1 of 75%  (a) staging studies with CT scans of the chest abdomen and pelvis on 05/29/18 show no distant metastases; bone scan shows questionable focus of tracer accumulation on the proximal to mid right femoral diaphysis  (b) MRI breast on  05/31/18 shows additional non mass like enhancement throughout upper outer right breast that extends 5cm inferomedially from biopsy proven malignancy.    (1) genetics testing reported 06/03/2018 at [facility] through the in [place] breast cancer guidelines based panel (11 genes analyzed) found no deleterious mutations and no variants of uncertain significance in ATM, BRCA1, BRCA2, CDH 1, CH EK 2, NBN, NF1, PALB 2, PT EN, STK 11, TP 53 06/06/2018  (2) neoadjuvant chemotherapy consisting of doxorubicin and cyclophosphamide in dose dense fashion x4 started 06/03/2018, completed 07/15/2018, followed by paclitaxel and carboplatin weekly x12 starting 08/02/2018 (a) Echo on 05/23/2018 shows an EF of 60-65%.  (3) definitive surgery to follow (patient is planning on bilateral mastectomies with no  reconstruction)  (4) adjuvant radiation to follow surgery  (5) may be a candidate for S 1418  (6) consider adjuvant capecitabine depending on final surgical results   PLAN: Darlene Grant will proceed to her 10th of 12 planned doses of paclitaxel today.  Since she is having no peripheral neuropathy I suspect she will be able to complete all 12 doses and that means her last treatment will be 10/18/2018  Accordingly I am scheduling her for a breast MRI the week of 10/22/2018.  She will meet with Dr. Donne Hazel shortly after that.  If the MRI shows as I expect a complete radiologic response or something close to that, then her port may be removed.  If it looks like 1 cm of residual tumor may be in place that I would leave the port in place because she may benefit from S14 18.  Today we also discussed the possibility of capecitabine adjuvantly.  I do not think she will need this, but we did talk about the possible toxicities side effects and complications of this agent if it is necessary  We are going to try a single dose of Granix between now and next week.  That may be all she needs to stay on course  Today she brought [place] a copy  of her genetics at [facility] and I have added the details to the summary above.  She knows to call for any other issues that may develop before then.  Magrinat, Virgie Dad, MD  10/04/18 11:49 AM Medical Oncology and Hematology [facility] [address] [place], [place] [address] Tel. [phone]    Fax. [phone]  I, Jacqualyn Posey am acting as a Education administrator for Chauncey Cruel, MD.   I, Lurline Del MD, have reviewed the above documentation for accuracy and completeness, and I agree with the above.

## 2018-10-04 ENCOUNTER — Inpatient Hospital Stay: Payer: BLUE CROSS/BLUE SHIELD

## 2018-10-04 ENCOUNTER — Other Ambulatory Visit: Payer: Self-pay

## 2018-10-04 ENCOUNTER — Encounter: Payer: Self-pay | Admitting: *Deleted

## 2018-10-04 ENCOUNTER — Inpatient Hospital Stay (HOSPITAL_BASED_OUTPATIENT_CLINIC_OR_DEPARTMENT_OTHER): Payer: BLUE CROSS/BLUE SHIELD | Admitting: Oncology

## 2018-10-04 VITALS — BP 119/76 | HR 85 | Temp 98.7°F | Resp 19 | Ht 64.0 in | Wt 143.6 lb

## 2018-10-04 DIAGNOSIS — Z171 Estrogen receptor negative status [ER-]: Secondary | ICD-10-CM | POA: Diagnosis not present

## 2018-10-04 DIAGNOSIS — C50411 Malignant neoplasm of upper-outer quadrant of right female breast: Secondary | ICD-10-CM

## 2018-10-04 DIAGNOSIS — C773 Secondary and unspecified malignant neoplasm of axilla and upper limb lymph nodes: Secondary | ICD-10-CM | POA: Diagnosis not present

## 2018-10-04 DIAGNOSIS — Z5189 Encounter for other specified aftercare: Secondary | ICD-10-CM | POA: Diagnosis not present

## 2018-10-04 DIAGNOSIS — R5383 Other fatigue: Secondary | ICD-10-CM | POA: Diagnosis not present

## 2018-10-04 DIAGNOSIS — Z95828 Presence of other vascular implants and grafts: Secondary | ICD-10-CM

## 2018-10-04 DIAGNOSIS — Z5111 Encounter for antineoplastic chemotherapy: Secondary | ICD-10-CM | POA: Diagnosis not present

## 2018-10-04 DIAGNOSIS — K1231 Oral mucositis (ulcerative) due to antineoplastic therapy: Secondary | ICD-10-CM | POA: Diagnosis not present

## 2018-10-04 LAB — CBC WITH DIFFERENTIAL/PLATELET
Abs Immature Granulocytes: 0.04 10*3/uL (ref 0.00–0.07)
Basophils Absolute: 0 10*3/uL (ref 0.0–0.1)
Basophils Relative: 0 %
Eosinophils Absolute: 0 10*3/uL (ref 0.0–0.5)
Eosinophils Relative: 0 %
HCT: 24.2 % — ABNORMAL LOW (ref 36.0–46.0)
Hemoglobin: 8 g/dL — ABNORMAL LOW (ref 12.0–15.0)
Immature Granulocytes: 1 %
Lymphocytes Relative: 11 %
Lymphs Abs: 0.7 10*3/uL (ref 0.7–4.0)
MCH: 34.9 pg — ABNORMAL HIGH (ref 26.0–34.0)
MCHC: 33.1 g/dL (ref 30.0–36.0)
MCV: 105.7 fL — ABNORMAL HIGH (ref 80.0–100.0)
Monocytes Absolute: 0.3 10*3/uL (ref 0.1–1.0)
Monocytes Relative: 6 %
Neutro Abs: 4.8 10*3/uL (ref 1.7–7.7)
Neutrophils Relative %: 82 %
Platelets: 127 10*3/uL — ABNORMAL LOW (ref 150–400)
RBC: 2.29 MIL/uL — ABNORMAL LOW (ref 3.87–5.11)
RDW: 15.8 % — ABNORMAL HIGH (ref 11.5–15.5)
WBC: 5.9 10*3/uL (ref 4.0–10.5)
nRBC: 0 % (ref 0.0–0.2)

## 2018-10-04 LAB — COMPREHENSIVE METABOLIC PANEL
ALT: 10 U/L (ref 0–44)
AST: 13 U/L — ABNORMAL LOW (ref 15–41)
Albumin: 3.9 g/dL (ref 3.5–5.0)
Alkaline Phosphatase: 82 U/L (ref 38–126)
Anion gap: 8 (ref 5–15)
BUN: 10 mg/dL (ref 6–20)
CO2: 26 mmol/L (ref 22–32)
Calcium: 9 mg/dL (ref 8.9–10.3)
Chloride: 105 mmol/L (ref 98–111)
Creatinine, Ser: 0.67 mg/dL (ref 0.44–1.00)
GFR calc Af Amer: >60 mL/min (ref >60)
GFR calc non Af Amer: >60 mL/min (ref >60)
Glucose, Bld: 101 mg/dL — ABNORMAL HIGH (ref 70–99)
Potassium: 4 mmol/L (ref 3.5–5.1)
Sodium: 139 mmol/L (ref 135–145)
Total Bilirubin: 0.7 mg/dL (ref 0.3–1.2)
Total Protein: 6.6 g/dL (ref 6.5–8.1)

## 2018-10-04 MED ORDER — HEPARIN SOD (PORK) LOCK FLUSH 100 UNIT/ML IV SOLN
500.0000 [IU] | Freq: Once | INTRAVENOUS | Status: AC | PRN
  Administered 2018-10-04: 15:00:00 500 [IU]
  Filled 2018-10-04: qty 5

## 2018-10-04 MED ORDER — SODIUM CHLORIDE 0.9 % IV SOLN
Freq: Once | INTRAVENOUS | Status: AC
  Administered 2018-10-04: 14:00:00 via INTRAVENOUS
  Filled 2018-10-04: qty 250

## 2018-10-04 MED ORDER — ACETAMINOPHEN 325 MG PO TABS
ORAL_TABLET | ORAL | Status: AC
  Filled 2018-10-04: qty 2

## 2018-10-04 MED ORDER — SODIUM CHLORIDE 0.9 % IV SOLN
Freq: Once | INTRAVENOUS | Status: AC
  Administered 2018-10-04: 11:00:00 via INTRAVENOUS
  Filled 2018-10-04: qty 250

## 2018-10-04 MED ORDER — FAMOTIDINE IN NACL 20-0.9 MG/50ML-% IV SOLN: 20.0000 mg | Freq: Once | INTRAVENOUS | Status: DC

## 2018-10-04 MED ORDER — SODIUM CHLORIDE 0.9% FLUSH
10.0000 mL | INTRAVENOUS | Status: DC | PRN
  Administered 2018-10-04: 10 mL
  Filled 2018-10-04: qty 10

## 2018-10-04 MED ORDER — DIPHENHYDRAMINE HCL 12.5 MG/5ML PO ELIX
12.5000 mg | ORAL_SOLUTION | Freq: Once | ORAL | Status: AC
  Administered 2018-10-04: 11:00:00 12.5 mg via ORAL

## 2018-10-04 MED ORDER — ACETAMINOPHEN 325 MG PO TABS
650.0000 mg | ORAL_TABLET | Freq: Once | ORAL | Status: AC
  Administered 2018-10-04: 650 mg via ORAL

## 2018-10-04 MED ORDER — PALONOSETRON HCL INJECTION 0.25 MG/5ML
0.2500 mg | Freq: Once | INTRAVENOUS | Status: AC
  Administered 2018-10-04: 0.25 mg via INTRAVENOUS

## 2018-10-04 MED ORDER — DEXAMETHASONE SODIUM PHOSPHATE 10 MG/ML IJ SOLN
INTRAMUSCULAR | Status: AC
  Filled 2018-10-04: qty 1

## 2018-10-04 MED ORDER — DEXAMETHASONE SODIUM PHOSPHATE 10 MG/ML IJ SOLN
4.0000 mg | Freq: Once | INTRAMUSCULAR | Status: AC
  Administered 2018-10-04: 4 mg via INTRAVENOUS

## 2018-10-04 MED ORDER — PALONOSETRON HCL INJECTION 0.25 MG/5ML
INTRAVENOUS | Status: AC
  Filled 2018-10-04: qty 5

## 2018-10-04 MED ORDER — DIPHENHYDRAMINE HCL 12.5 MG/5ML PO ELIX
ORAL_SOLUTION | ORAL | Status: AC
  Filled 2018-10-04: qty 5

## 2018-10-04 MED ORDER — SODIUM CHLORIDE 0.9 % IV SOLN
20.0000 mg | Freq: Once | INTRAVENOUS | Status: AC
  Administered 2018-10-04: 20 mg via INTRAVENOUS
  Filled 2018-10-04: qty 2

## 2018-10-04 MED ORDER — SODIUM CHLORIDE 0.9 % IV SOLN
243.0000 mg | Freq: Once | INTRAVENOUS | Status: AC
  Administered 2018-10-04: 240 mg via INTRAVENOUS
  Filled 2018-10-04: qty 24

## 2018-10-04 MED ORDER — SODIUM CHLORIDE 0.9 % IV SOLN
80.0000 mg/m2 | Freq: Once | INTRAVENOUS | Status: AC
  Administered 2018-10-04: 138 mg via INTRAVENOUS
  Filled 2018-10-04: qty 23

## 2018-10-04 NOTE — Patient Instructions (Signed)
   [facility] Discharge Instructions for Patients Receiving Chemotherapy  Today you received the following chemotherapy agents Taxol and Carboplatin   To help prevent nausea and vomiting after your treatment, we encourage you to take your nausea medication as directed.    If you develop nausea and vomiting that is not controlled by your nausea medication, call the clinic.   BELOW ARE SYMPTOMS THAT SHOULD BE REPORTED IMMEDIATELY:  *FEVER GREATER THAN 100.5 F  *CHILLS WITH OR WITHOUT FEVER  NAUSEA AND VOMITING THAT IS NOT CONTROLLED WITH YOUR NAUSEA MEDICATION  *UNUSUAL SHORTNESS OF BREATH  *UNUSUAL BRUISING OR BLEEDING  TENDERNESS IN MOUTH AND THROAT WITH OR WITHOUT PRESENCE OF ULCERS  *URINARY PROBLEMS  *BOWEL PROBLEMS  UNUSUAL RASH Items with * indicate a potential emergency and should be followed up as soon as possible.  Feel free to call the clinic should you have any questions or concerns. The clinic phone number is [phone].  Please show the CHEMO ALERT CARD at check-in to the Emergency Department and triage nurse.   

## 2018-10-08 ENCOUNTER — Encounter: Payer: Self-pay | Admitting: *Deleted

## 2018-10-08 ENCOUNTER — Encounter: Payer: Self-pay | Admitting: Oncology

## 2018-10-08 ENCOUNTER — Inpatient Hospital Stay: Payer: BLUE CROSS/BLUE SHIELD

## 2018-10-08 ENCOUNTER — Telehealth: Payer: Self-pay | Admitting: *Deleted

## 2018-10-08 NOTE — Telephone Encounter (Signed)
This RN returned VM from pt - who stated she would like to reschedule injection appointment today due to having diarrhea and " just not feeling like getting out ".  Pt is supposed to get granix today and tomorrow- noted only injection appointment is for today.  Message left to return call to this RN- as well as need for 2 injections ideally prior to treatment this Friday.  Will await return call from pt.

## 2018-10-08 NOTE — Telephone Encounter (Signed)
See other entry for this date

## 2018-10-09 ENCOUNTER — Inpatient Hospital Stay: Payer: BLUE CROSS/BLUE SHIELD

## 2018-10-09 ENCOUNTER — Other Ambulatory Visit: Payer: Self-pay

## 2018-10-09 VITALS — BP 95/67 | HR 94 | Temp 98.3°F | Resp 18

## 2018-10-09 DIAGNOSIS — Z5111 Encounter for antineoplastic chemotherapy: Secondary | ICD-10-CM | POA: Diagnosis not present

## 2018-10-09 DIAGNOSIS — K1231 Oral mucositis (ulcerative) due to antineoplastic therapy: Secondary | ICD-10-CM | POA: Diagnosis not present

## 2018-10-09 DIAGNOSIS — C50411 Malignant neoplasm of upper-outer quadrant of right female breast: Secondary | ICD-10-CM | POA: Diagnosis not present

## 2018-10-09 DIAGNOSIS — Z95828 Presence of other vascular implants and grafts: Secondary | ICD-10-CM

## 2018-10-09 DIAGNOSIS — Z5189 Encounter for other specified aftercare: Secondary | ICD-10-CM | POA: Diagnosis not present

## 2018-10-09 DIAGNOSIS — R5383 Other fatigue: Secondary | ICD-10-CM | POA: Diagnosis not present

## 2018-10-09 DIAGNOSIS — Z171 Estrogen receptor negative status [ER-]: Secondary | ICD-10-CM | POA: Diagnosis not present

## 2018-10-09 DIAGNOSIS — C773 Secondary and unspecified malignant neoplasm of axilla and upper limb lymph nodes: Secondary | ICD-10-CM | POA: Diagnosis not present

## 2018-10-09 MED ORDER — TBO-FILGRASTIM 480 MCG/0.8ML ~~LOC~~ SOSY
480.0000 ug | PREFILLED_SYRINGE | Freq: Once | SUBCUTANEOUS | Status: AC
  Administered 2018-10-09: 10:00:00 480 ug via SUBCUTANEOUS

## 2018-10-09 MED ORDER — TBO-FILGRASTIM 480 MCG/0.8ML ~~LOC~~ SOSY
PREFILLED_SYRINGE | SUBCUTANEOUS | Status: AC
  Filled 2018-10-09: qty 0.8

## 2018-10-09 NOTE — Patient Instructions (Signed)
Tbo-Filgrastim injection What is this medicine? TBO-FILGRASTIM (T B O fil GRA stim) is a granulocyte colony-stimulating factor that stimulates the growth of neutrophils, a type of white blood cell important in the body's fight against infection. It is used to reduce the incidence of fever and infection in patients with certain types of cancer who are receiving chemotherapy that affects the bone marrow. This medicine may be used for other purposes; ask your health care provider or pharmacist if you have questions. COMMON BRAND NAME(S): Granix What should I tell my health care provider before I take this medicine? They need to know if you have any of these conditions: -bone scan or tests planned -kidney disease -sickle cell anemia -an unusual or allergic reaction to tbo-filgrastim, filgrastim, pegfilgrastim, other medicines, foods, dyes, or preservatives -pregnant or trying to get pregnant -breast-feeding How should I use this medicine? This medicine is for injection under the skin. If you get this medicine at home, you will be taught how to prepare and give this medicine. Refer to the Instructions for Use that come with your medication packaging. Use exactly as directed. Take your medicine at regular intervals. Do not take your medicine more often than directed. It is important that you put your used needles and syringes in a special sharps container. Do not put them in a trash can. If you do not have a sharps container, call your pharmacist or healthcare provider to get one. Talk to your pediatrician regarding the use of this medicine in children. While this drug may be prescribed for children as young as 1 month of age for selected conditions, precautions do apply. Overdosage: If you think you have taken too much of this medicine contact a poison control center or emergency room at once. NOTE: This medicine is only for you. Do not share this medicine with others. What if I miss a dose? It is  important not to miss your dose. Call your doctor or health care professional if you miss a dose. What may interact with this medicine? This medicine may interact with the following medications: -medicines that may cause a release of neutrophils, such as lithium This list may not describe all possible interactions. Give your health care provider a list of all the medicines, herbs, non-prescription drugs, or dietary supplements you use. Also tell them if you smoke, drink alcohol, or use illegal drugs. Some items may interact with your medicine. What should I watch for while using this medicine? You may need blood work done while you are taking this medicine. What side effects may I notice from receiving this medicine? Side effects that you should report to your doctor or health care professional as soon as possible: -allergic reactions like skin rash, itching or hives, swelling of the face, lips, or tongue -back pain -blood in the urine -dark urine -dizziness -fast heartbeat -feeling faint -shortness of breath or breathing problems -signs and symptoms of infection like fever or chills; cough; or sore throat -signs and symptoms of kidney injury like trouble passing urine or change in the amount of urine -stomach or side pain, or pain at the shoulder -sweating -swelling of the legs, ankles, or abdomen -tiredness Side effects that usually do not require medical attention (report to your doctor or health care professional if they continue or are bothersome): -bone pain -diarrhea -headache -muscle pain -vomiting This list may not describe all possible side effects. Call your doctor for medical advice about side effects. You may report side effects to FDA at   1-800-FDA-1088. Where should I keep my medicine? Keep out of the reach of children. Store in a refrigerator between 2 and 8 degrees C (36 and 46 degrees F). Keep in carton to protect from light. Throw away this medicine if it is left out  of the refrigerator for more than 5 consecutive days. Throw away any unused medicine after the expiration date. NOTE: This sheet is a summary. It may not cover all possible information. If you have questions about this medicine, talk to your doctor, pharmacist, or health care provider.  2019 Elsevier/Gold Standard (2017-01-23 16:56:18)  

## 2018-10-10 ENCOUNTER — Encounter: Payer: Self-pay | Admitting: *Deleted

## 2018-10-10 NOTE — Progress Notes (Signed)
[place]  [phone] [phone]    ID: Darlene Grant DOB: [birth year]  MR#: [id number]  PJS#:[id number]  Patient Care Team: Jana Hakim Virgie Dad, MD as PCP - General (Oncology) Rolm Bookbinder, MD as Consulting Physician (General Surgery) Magrinat, Virgie Dad, MD as Consulting Physician (Oncology) Gery Pray, MD as Consulting Physician (Radiation Oncology) Emily Filbert, MD as Consulting Physician (Obstetrics and Gynecology) Lavonna Monarch, MD as Consulting Physician (Dermatology) Mauro Kaufmann, RN as Oncology Nurse Navigator Rockwell Germany, RN as Oncology Nurse Navigator OTHER MD:   CHIEF COMPLAINT: Triple negative breast cancer  CURRENT TREATMENT: Neoadjuvant chemotherapy   HISTORY OF CURRENT ILLNESS: .introg{From the original intake note:  Darlene Grant had routine screening mammography on 04/19/2018 showing a possible abnormality in the right breast. She underwent unilateral right diagnostic mammography with tomography and right breast ultrasonography at [facility] on 05/02/2018 showing: Breast Density Category C: There was an oval circumscribed mass in the posterior aspect of the upper outer right breast.  On exam this measured 3.5 cm, was firm, irregular and centered on 10-11 o'clock 5 cm from the nipple.  By ultrasonography there was a 3.7 x 1.7 x 1.5 cm elongated, irregular, hypoechoic mass in the 10:30 o'clock position of the right breast, 5 cm from the nipple.  Ultrasound of the right axilla showed 7 abnormal right axillary lymph nodes highly suspicious for metastatic nodes.  Accordingly on 05/03/2018 she proceeded to biopsy of the right breast area in question and one suspicious lymph node. The pathology from this procedure showed ([id number]): Right Breast: invasive mammary carcinoma, grade 3, E-cadherin positive (and therefore ductal) lymphovascular invasion present. Lymph node: Invasive mammary  carcinoma Prognostic indicators significant for: estrogen receptor, 0% negative and progesterone receptor, 0% negative. Proliferation marker Ki67 at 75%. HER2 negative (1+).  The patient's subsequent history is as detailed below.   INTERVAL HISTORY: Catricia returns today for follow-up and treatment of her triple negative breast cancer.   She continues on neoadjuvant chemotherapy consisting of paclitaxel and carboplatin weekly x12. Today is day 1 cycle 11. She notes that she had terrible diarrhea starting on Sunday 10/06/2018. She took one dose of imodium to treat, with positive results. Her appetite was decreased. She was sure to drink a lot of Gatorade. She has had no fever. She had a decreased appetite as well. These symptoms have resolved.  Because of cytopenias, she has been receiving intermittent Granix.  She received 2 doses after the 04/03 and oh 4/10 treatments, but after the 04/17 treatment she only received 1, says she really did not want to come here 2 times if she could get away with 1.  Her counts today :.   Results for [name], [name] (MRN [id number]) as of 10/11/2018 12:01  Ref. Range 10/11/2018 11:28  WBC Latest Ref Range: 4.0 - 10.5 K/uL 8.0  RBC Latest Ref Range: 3.87 - 5.11 MIL/uL 2.09 (L)  Hemoglobin Latest Ref Range: 12.0 - 15.0 g/dL 7.3 (L)  HCT Latest Ref Range: 36.0 - 46.0 % 21.9 (L)  MCV Latest Ref Range: 80.0 - 100.0 fL 104.8 (H)  MCH Latest Ref Range: 26.0 - 34.0 pg 34.9 (H)  MCHC Latest Ref Range: 30.0 - 36.0 g/dL 33.3  RDW Latest Ref Range: 11.5 - 15.5 % 16.2 (H)  Platelets Latest Ref Range: 150 - 400 K/uL 131 (L)  nRBC Latest Ref Range: 0.0 - 0.2 % 0.0   Since her last visit here, she has not undergone  any additional studies. She is schedule for a bilateral breast MRI on 10/22/2018.   She has a dental appointment scheduled for 11/04/2018. She needs a recent printout of her blood work for them. Blood work will be scheduled for 11/06/2018.   REVIEW  OF SYSTEMS: Stevie notes that her pubic hair is growing back, but she believes she has an ingrown hair that is red and somewhat sore when she sits down. She has treated with some hydrocortisone cream and a hot compress. Laura-Lee is curious about what her appointments and imaging will look like moving forward post chemotherapy.   Her mother in law is experiencing some medical complications, which is stressful for [facility]. Kellsie has not had a formal exercise routine recently as she has not felt up to it.   The patient denies unusual headaches, visual changes, nausea, vomiting, or dizziness. There has been no unusual cough, phlegm production, or pleurisy. This been no change in bladder habits. The patient denies unexplained fatigue or unexplained weight loss, bleeding, rash, or fever. A detailed review of systems was otherwise noncontributory.     PAST MEDICAL HISTORY: Past Medical History:  Diagnosis Date  . Abnormal Pap smear    ASCUS 03/13/11  . Anemia   . Breast pain, left   . Cancer ([place])   . Candida vaginitis   . Complication of anesthesia    pt states that all narcotics make her angry and she would prefer to avoid them.  . Rectal itching   . Vaginal Pap smear, abnormal     PAST SURGICAL HISTORY: Past Surgical History:  Procedure Laterality Date  . BREAST BIOPSY Right 8/16  . CESAREAN SECTION N/A 11/21/2013   Procedure: CESAREAN SECTION;  Surgeon: Alwyn Pea, MD;  Location: [facility];  Service: Obstetrics;  Laterality: N/A;  . CESAREAN SECTION N/A 06/23/2016   Procedure: CESAREAN SECTION;  Surgeon: Emily Filbert, MD;  Location: [facility];  Service: Obstetrics;  Laterality: N/A;  . HAND SURGERY  04/2012   ligament repair  . PILONIDAL CYST EXCISION  2000  . PORTACATH PLACEMENT N/A 05/28/2018   Procedure: INSERTION PORT-A-CATH WITH ULTRASOUND;  Surgeon: Rolm Bookbinder, MD;  Location: [facility];  Service: General;  Laterality: N/A;  . WISDOM TOOTH  EXTRACTION  AGE 43    FAMILY HISTORY: Family History  Problem Relation Age of Onset  . Cancer Mother 38       breast, premenopausal  . Breast cancer Mother   . Cancer Maternal Grandmother        skin, stomach  . Cancer Maternal Grandfather        lung   As of November 2019 her father is 67 years old with a history of Parkinson's. Patients' mother is 46 years old with a history of breast cancer, diagnosed age 87, treated with radical mastectomy. The patient has no brothers and two sisters. Besides the patient's mother, the patient has no other family medical history of breast or ovarian cancer. Her maternal grandmother had stomach cancer and her maternal grandfather had lung cancer, --he was a smoker.    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  Her husband, [name], an Chief Financial Officer. She has two children, [name], age 64, and [name], age 93 months.  The patient is not a smoker. She does drink alcohol and drinks an estimated one to two drinks per week.     ADVANCED DIRECTIVES: [name], her husband, is her healthcare department   HEALTH MAINTENANCE: Social History   Tobacco Use  . Smoking status: Never Smoker  . Smokeless tobacco: Never Used  Substance Use Topics  . Alcohol use: Yes    Alcohol/week: 0.0 standard drinks    Comment: occassionally  . Drug use: No    Colonoscopy: no  PAP: 12/2017  Bone density: no   Allergies  Allergen Reactions  . Augmentin [Amoxicillin-Pot Clavulanate]     diarrhea  . Hydrocodone Itching  . Keflex [Cephalexin] Diarrhea    Current Outpatient Medications  Medication Sig Dispense Refill  . acyclovir cream (ZOVIRAX) 5 % Apply 1 application topically every 3 (three) hours. 15 g 2   . ciprofloxacin (CIPRO) 500 MG tablet Take one tablet by mouth 2 times daily for 5 days after each chemo cycle, starting on day 8 of chemo 40 tablet 1  . cyclobenzaprine (FLEXERIL) 10 MG tablet Take 0.5-1 tablets (5-10 mg total) by mouth 3 (three) times daily as needed for muscle spasms. 30 tablet 1  . dexamethasone (DECADRON) 4 MG tablet Take 2 tablets by mouth once a day on the day after chemotherapy and then take 2 tablets two times a day for 2 days. Take with food. 30 tablet 1  . famotidine (PEPCID) 20 MG tablet Take 1 tablet (20 mg total) by mouth 2 (two) times daily.    . fluticasone (FLONASE) 50 MCG/ACT nasal spray Place 2 sprays into both nostrils daily. 16 g 2  . lidocaine (XYLOCAINE) 2 % solution Use as directed 5 mLs in the mouth or throat every 3 (three) hours as needed for mouth pain. 200 mL 1  . lidocaine-prilocaine (EMLA) cream Apply to affected area once 30 g 3  . loratadine (CLARITIN) 10 MG tablet Take 10 mg by mouth daily as needed for allergies.    Marland Kitchen LORazepam (ATIVAN) 0.5 MG tablet Take 1 tablet (0.5 mg total) by mouth at bedtime as needed (Nausea or vomiting). 30 tablet 0  . polyethylene glycol (MIRALAX) 0.34 gm/ml SOLN     . Probiotic Product (PROBIOTIC DAILY PO) Take 1 tablet by mouth.    . prochlorperazine (COMPAZINE) 10 MG tablet Take 1 tablet (10 mg total) by mouth every 6 (six) hours as needed (Nausea or vomiting). 30 tablet 1  . sucralfate (CARAFATE) 1 g tablet Take 1 tablet (1 g total) by mouth 4 (four) times daily -  with meals and at bedtime. 40 tablet 0   No current facility-administered medications for this visit.      OBJECTIVE: Young white woman who appears stated age 79:   10/11/18 1139  BP: 113/64  Pulse: 78  Resp: 18  Temp: 98.6 F (37 C)  SpO2: 100%     Body mass index is 24.84 kg/m.   Wt Readings from Last 3 Encounters:  10/11/18 144 lb 11.2 oz (65.6 kg)  10/04/18 143 lb 9.6 oz (65.1 kg)  09/20/18 143 lb (64.9 kg)  ECOG FS: 0 - Asymptomatic   Sclerae unicteric, pupils round and equal No cervical or supraclavicular adenopathy Lungs no rales or rhonchi Heart regular rate and rhythm Abd soft, nontender, positive bowel sounds MSK no focal spinal tenderness,  no upper extremity lymphedema Neuro: nonfocal, well oriented, appropriate affect Breasts: I do not palpate a mass in either breast.  Both axillae are benign  LAB RESULTS:  CMP     Component Value Date/Time   NA 139 10/11/2018 1128   K 4.0 10/11/2018 1128   CL 104 10/11/2018 1128   CO2 25 10/11/2018 1128   GLUCOSE 88 10/11/2018 1128   BUN 7 10/11/2018 1128   CREATININE 0.67 10/11/2018 1128   CREATININE 0.76 05/15/2018 0827   CREATININE 0.78 01/15/2018 1033   CALCIUM 9.0 10/11/2018 1128   PROT 6.4 (L) 10/11/2018 1128   ALBUMIN 3.9 10/11/2018 1128   AST 13 (L) 10/11/2018 1128   AST 10 (L) 05/15/2018 0827   ALT 9 10/11/2018 1128   ALT 9 05/15/2018 0827   ALKPHOS 81 10/11/2018 1128   BILITOT 0.6 10/11/2018 1128   BILITOT 1.1 05/15/2018 0827   GFRNONAA >60 10/11/2018 1128   GFRNONAA >60 05/15/2018 0827   GFRNONAA 95 01/15/2018 1033   GFRAA >60 10/11/2018 1128   GFRAA >60 05/15/2018 0827   GFRAA 110 01/15/2018 1033    No results found for: TOTALPROTELP, ALBUMINELP, A1GS, A2GS, BETS, BETA2SER, GAMS, MSPIKE, SPEI  No results found for: KPAFRELGTCHN, LAMBDASER, KAPLAMBRATIO  Lab Results  Component Value Date   WBC 8.0 10/11/2018   NEUTROABS 6.7 10/11/2018   HGB 7.3 (L) 10/11/2018   HCT 21.9 (L) 10/11/2018   MCV 104.8 (H) 10/11/2018   PLT 131 (L) 10/11/2018    _0 @  No results found for: LABCA2  No components found for: IWOEHO122  No results for input(s): INR in the last 168 hours.  No results found for: LABCA2  No results found for: QMG500  No results found for: BBC488  No results found for: QBV694  No results found for: CA2729  No components found for: HGQUANT  No results found for: CEA1 / No results found for: CEA1   No  results found for: AFPTUMOR  No results found for: CHROMOGRNA  No results found for: PSA1  Appointment on 10/11/2018  Component Date Value Ref Range Status  . Sodium 10/11/2018 139  135 - 145 mmol/L Final  . Potassium 10/11/2018 4.0  3.5 - 5.1 mmol/L Final  . Chloride 10/11/2018 104  98 - 111 mmol/L Final  . CO2 10/11/2018 25  22 - 32 mmol/L Final  . Glucose, Bld 10/11/2018 88  70 - 99 mg/dL Final  . BUN 10/11/2018 7  6 - 20 mg/dL Final  . Creatinine, Ser 10/11/2018 0.67  0.44 - 1.00 mg/dL Final  . Calcium 10/11/2018 9.0  8.9 - 10.3 mg/dL Final  . Total Protein 10/11/2018 6.4* 6.5 - 8.1 g/dL Final  . Albumin 10/11/2018 3.9  3.5 - 5.0 g/dL Final  . AST 10/11/2018 13* 15 - 41 U/L Final  . ALT 10/11/2018 9  0 - 44 U/L Final  . Alkaline Phosphatase 10/11/2018 81  38 - 126 U/L Final  . Total Bilirubin 10/11/2018 0.6  0.3 - 1.2 mg/dL Final  . GFR calc non Af Amer 10/11/2018 >60  >60 mL/min Final  . GFR calc Af Amer 10/11/2018 >60  >60 mL/min Final  . Anion gap 10/11/2018 10  5 - 15 Final   Performed at [facility], [place] [address], [place], [place] [address]  . WBC 10/11/2018 8.0  4.0 - 10.5 K/uL Final  . RBC 10/11/2018 2.09* 3.87 - 5.11 MIL/uL Final  . Hemoglobin 10/11/2018 7.3* 12.0 - 15.0 g/dL  Final  . HCT 10/11/2018 21.9* 36.0 - 46.0 % Final  . MCV 10/11/2018 104.8* 80.0 - 100.0 fL Final  . MCH 10/11/2018 34.9* 26.0 - 34.0 pg Final  . MCHC 10/11/2018 33.3  30.0 - 36.0 g/dL Final  . RDW 10/11/2018 16.2* 11.5 - 15.5 % Final  . Platelets 10/11/2018 131* 150 - 400 K/uL Final  . nRBC 10/11/2018 0.0  0.0 - 0.2 % Final  . Neutrophils Relative % 10/11/2018 83  % Final  . Neutro Abs 10/11/2018 6.7  1.7 - 7.7 K/uL Final  . Lymphocytes Relative 10/11/2018 11  % Final  . Lymphs Abs 10/11/2018 0.9  0.7 - 4.0 K/uL Final  . Monocytes Relative 10/11/2018 5  % Final  . Monocytes Absolute 10/11/2018 0.4  0.1 - 1.0 K/uL Final  . Eosinophils Relative 10/11/2018 0  % Final   . Eosinophils Absolute 10/11/2018 0.0  0.0 - 0.5 K/uL Final  . Basophils Relative 10/11/2018 0  % Final  . Basophils Absolute 10/11/2018 0.0  0.0 - 0.1 K/uL Final  . Immature Granulocytes 10/11/2018 1  % Final  . Abs Immature Granulocytes 10/11/2018 0.09* 0.00 - 0.07 K/uL Final   Performed at [facility], [address] [place], [place] [address]    (this displays the last labs from the last 3 days)  No results found for: TOTALPROTELP, ALBUMINELP, A1GS, A2GS, BETS, BETA2SER, GAMS, MSPIKE, SPEI (this displays SPEP labs)  No results found for: KPAFRELGTCHN, LAMBDASER, KAPLAMBRATIO (kappa/lambda light chains)  No results found for: HGBA, HGBA2QUANT, HGBFQUANT, HGBSQUAN (Hemoglobinopathy evaluation)   No results found for: LDH  Lab Results  Component Value Date   IRON 64 07/15/2018   TIBC 265 07/15/2018   IRONPCTSAT 24 07/15/2018   (Iron and TIBC)  Lab Results  Component Value Date   FERRITIN 92 07/15/2018    Urinalysis    Component Value Date/Time   COLORURINE YELLOW 12/18/2016 [place] 12/18/2016 1607   LABSPEC 1.010 12/18/2016 1607   PHURINE 7.0 12/18/2016 [place] 12/18/2016 1607   HGBUR NEGATIVE 12/18/2016 1607   HGBUR negative 01/13/2010 0940   BILIRUBINUR NEGATIVE 12/18/2016 1607   BILIRUBINUR neg 05/17/2015 [place] 12/18/2016 1607   PROTEINUR NEGATIVE 12/18/2016 1607   UROBILINOGEN 0.2 05/17/2015 1034   UROBILINOGEN 0.2 01/13/2010 0940   NITRITE NEGATIVE 12/18/2016 1607   LEUKOCYTESUR NEGATIVE 12/18/2016 1607    STUDIES:  Preop MRI to be scheduled  ELIGIBLE FOR AVAILABLE RESEARCH PROTOCOL: S 1418   ASSESSMENT: 43 y.o. [place] woman, status post right breast upper outer quadrant and right axillary lymph node biopsy 05/03/2018 for a clinical T2 N2, stage IIIC invasive ductal carcinoma, triple negative, with an MIB-1 of 75%  (a) staging studies with CT  scans of the chest abdomen and pelvis on 05/29/18 show no distant metastases; bone scan shows questionable focus of tracer accumulation on the proximal to mid right femoral diaphysis  (b) MRI breast on 05/31/18 shows additional non mass like enhancement throughout upper outer right breast that extends 5cm inferomedially from biopsy proven malignancy.    (1) genetics testing reported 06/03/2018 at [facility] through the in [place] breast cancer guidelines based panel (11 genes analyzed) found no deleterious mutations and no variants of uncertain significance in ATM, BRCA1, BRCA2, CDH 1, CH EK 2, NBN, NF1, PALB 2, PT EN, STK 11, TP 53 06/06/2018  (2) neoadjuvant chemotherapy consisting of doxorubicin and cyclophosphamide in dose dense fashion  x4 started 06/03/2018, completed 07/15/2018, followed by paclitaxel and carboplatin weekly x12 starting 08/02/2018 (a) Echo on 05/23/2018 shows an EF of 60-65%.  (3) definitive surgery to follow (patient is planning on bilateral mastectomies with no reconstruction)  (4) adjuvant radiation to follow surgery  (5) may be a candidate for S 1418  (6) consider adjuvant capecitabine depending on final surgical results   PLAN: Deidre Ala will receive her 11th of 12 planned doses of paclitaxel and carboplatin today.  She has no peripheral neuropathy.  She only received 1 dose of Granix last week and she has a good white cell count today so she will only need 1 dose in between August and the next cycle, which will be her last.  She is already scheduled for breast MRI and a visit with Dr. Donne Hazel.  I am going to see her again 11/01/2018 with lab work at that time.  Very likely she will have surgery in late May or early June  As far as her pelvic discomfort I suggested she try Cipro for the next few days and if she is not considerably better by Monday call her gynecologist.  She is interested in having repeat CT scans at some point.  After much bargaining we agreed she would  have 1 year after her surgery and then another one 2 years after her surgery, after which probably we would only evaluate for symptoms.  She does understand the risk of false positives and the fact that finding metastasis a few months before it becomes symptomatic does not improve survival  She knows to call for any other issue that may develop before the next visit here  Magrinat, Virgie Dad, MD  10/11/18 12:12 PM Medical Oncology and Hematology [facility] [place], [place] [address] Tel. [phone]    Fax. [phone]  I, Jacqualyn Posey am acting as a Education administrator for Chauncey Cruel, MD.   I, Lurline Del MD, have reviewed the above documentation for accuracy and completeness, and I agree with the above.

## 2018-10-11 ENCOUNTER — Inpatient Hospital Stay: Payer: BLUE CROSS/BLUE SHIELD

## 2018-10-11 ENCOUNTER — Other Ambulatory Visit: Payer: Self-pay

## 2018-10-11 ENCOUNTER — Inpatient Hospital Stay (HOSPITAL_BASED_OUTPATIENT_CLINIC_OR_DEPARTMENT_OTHER): Payer: BLUE CROSS/BLUE SHIELD | Admitting: Oncology

## 2018-10-11 VITALS — BP 113/64 | HR 78 | Temp 98.6°F | Resp 18 | Ht 64.0 in | Wt 144.7 lb

## 2018-10-11 DIAGNOSIS — C50411 Malignant neoplasm of upper-outer quadrant of right female breast: Secondary | ICD-10-CM | POA: Diagnosis not present

## 2018-10-11 DIAGNOSIS — C773 Secondary and unspecified malignant neoplasm of axilla and upper limb lymph nodes: Secondary | ICD-10-CM

## 2018-10-11 DIAGNOSIS — Z171 Estrogen receptor negative status [ER-]: Secondary | ICD-10-CM

## 2018-10-11 DIAGNOSIS — Z5111 Encounter for antineoplastic chemotherapy: Secondary | ICD-10-CM | POA: Diagnosis not present

## 2018-10-11 DIAGNOSIS — R5383 Other fatigue: Secondary | ICD-10-CM | POA: Diagnosis not present

## 2018-10-11 DIAGNOSIS — K1231 Oral mucositis (ulcerative) due to antineoplastic therapy: Secondary | ICD-10-CM | POA: Diagnosis not present

## 2018-10-11 DIAGNOSIS — Z5189 Encounter for other specified aftercare: Secondary | ICD-10-CM | POA: Diagnosis not present

## 2018-10-11 DIAGNOSIS — Z95828 Presence of other vascular implants and grafts: Secondary | ICD-10-CM

## 2018-10-11 LAB — COMPREHENSIVE METABOLIC PANEL
ALT: 9 U/L (ref 0–44)
AST: 13 U/L — ABNORMAL LOW (ref 15–41)
Albumin: 3.9 g/dL (ref 3.5–5.0)
Alkaline Phosphatase: 81 U/L (ref 38–126)
Anion gap: 10 (ref 5–15)
BUN: 7 mg/dL (ref 6–20)
CO2: 25 mmol/L (ref 22–32)
Calcium: 9 mg/dL (ref 8.9–10.3)
Chloride: 104 mmol/L (ref 98–111)
Creatinine, Ser: 0.67 mg/dL (ref 0.44–1.00)
GFR calc Af Amer: >60 mL/min (ref >60)
GFR calc non Af Amer: >60 mL/min (ref >60)
Glucose, Bld: 88 mg/dL (ref 70–99)
Potassium: 4 mmol/L (ref 3.5–5.1)
Sodium: 139 mmol/L (ref 135–145)
Total Bilirubin: 0.6 mg/dL (ref 0.3–1.2)
Total Protein: 6.4 g/dL — ABNORMAL LOW (ref 6.5–8.1)

## 2018-10-11 LAB — CBC WITH DIFFERENTIAL/PLATELET
Abs Immature Granulocytes: 0.09 10*3/uL — ABNORMAL HIGH (ref 0.00–0.07)
Basophils Absolute: 0 10*3/uL (ref 0.0–0.1)
Basophils Relative: 0 %
Eosinophils Absolute: 0 10*3/uL (ref 0.0–0.5)
Eosinophils Relative: 0 %
HCT: 21.9 % — ABNORMAL LOW (ref 36.0–46.0)
Hemoglobin: 7.3 g/dL — ABNORMAL LOW (ref 12.0–15.0)
Immature Granulocytes: 1 %
Lymphocytes Relative: 11 %
Lymphs Abs: 0.9 10*3/uL (ref 0.7–4.0)
MCH: 34.9 pg — ABNORMAL HIGH (ref 26.0–34.0)
MCHC: 33.3 g/dL (ref 30.0–36.0)
MCV: 104.8 fL — ABNORMAL HIGH (ref 80.0–100.0)
Monocytes Absolute: 0.4 10*3/uL (ref 0.1–1.0)
Monocytes Relative: 5 %
Neutro Abs: 6.7 10*3/uL (ref 1.7–7.7)
Neutrophils Relative %: 83 %
Platelets: 131 10*3/uL — ABNORMAL LOW (ref 150–400)
RBC: 2.09 MIL/uL — ABNORMAL LOW (ref 3.87–5.11)
RDW: 16.2 % — ABNORMAL HIGH (ref 11.5–15.5)
WBC: 8 10*3/uL (ref 4.0–10.5)
nRBC: 0 % (ref 0.0–0.2)

## 2018-10-11 MED ORDER — SODIUM CHLORIDE 0.9% FLUSH
10.0000 mL | INTRAVENOUS | Status: DC | PRN
  Administered 2018-10-11: 10 mL
  Filled 2018-10-11: qty 10

## 2018-10-11 MED ORDER — PALONOSETRON HCL INJECTION 0.25 MG/5ML
INTRAVENOUS | Status: AC
  Filled 2018-10-11: qty 5

## 2018-10-11 MED ORDER — ACETAMINOPHEN 325 MG PO TABS
650.0000 mg | ORAL_TABLET | Freq: Once | ORAL | Status: AC
  Administered 2018-10-11: 650 mg via ORAL

## 2018-10-11 MED ORDER — ACETAMINOPHEN 325 MG PO TABS
ORAL_TABLET | ORAL | Status: AC
  Filled 2018-10-11: qty 2

## 2018-10-11 MED ORDER — DEXAMETHASONE SODIUM PHOSPHATE 10 MG/ML IJ SOLN
4.0000 mg | Freq: Once | INTRAMUSCULAR | Status: AC
  Administered 2018-10-11: 13:00:00 4 mg via INTRAVENOUS

## 2018-10-11 MED ORDER — DIPHENHYDRAMINE HCL 12.5 MG/5ML PO ELIX
ORAL_SOLUTION | ORAL | Status: AC
  Filled 2018-10-11: qty 5

## 2018-10-11 MED ORDER — SODIUM CHLORIDE 0.9 % IV SOLN
80.0000 mg/m2 | Freq: Once | INTRAVENOUS | Status: AC
  Administered 2018-10-11: 14:00:00 138 mg via INTRAVENOUS
  Filled 2018-10-11: qty 23

## 2018-10-11 MED ORDER — HEPARIN SOD (PORK) LOCK FLUSH 100 UNIT/ML IV SOLN
500.0000 [IU] | Freq: Once | INTRAVENOUS | Status: AC | PRN
  Administered 2018-10-11: 500 [IU]
  Filled 2018-10-11: qty 5

## 2018-10-11 MED ORDER — PALONOSETRON HCL INJECTION 0.25 MG/5ML
0.2500 mg | Freq: Once | INTRAVENOUS | Status: AC
  Administered 2018-10-11: 13:00:00 0.25 mg via INTRAVENOUS

## 2018-10-11 MED ORDER — SODIUM CHLORIDE 0.9% FLUSH
10.0000 mL | INTRAVENOUS | Status: DC | PRN
  Administered 2018-10-11: 11:00:00 10 mL
  Filled 2018-10-11: qty 10

## 2018-10-11 MED ORDER — SODIUM CHLORIDE 0.9 % IV SOLN
Freq: Once | INTRAVENOUS | Status: AC
  Administered 2018-10-11: 13:00:00 via INTRAVENOUS
  Filled 2018-10-11: qty 250

## 2018-10-11 MED ORDER — FAMOTIDINE IN NACL 20-0.9 MG/50ML-% IV SOLN
20.0000 mg | Freq: Once | INTRAVENOUS | Status: AC
  Administered 2018-10-11: 20 mg via INTRAVENOUS

## 2018-10-11 MED ORDER — SODIUM CHLORIDE 0.9 % IV SOLN: 20.0000 mg | Freq: Once | INTRAVENOUS | Status: DC

## 2018-10-11 MED ORDER — FAMOTIDINE IN NACL 20-0.9 MG/50ML-% IV SOLN
INTRAVENOUS | Status: AC
  Filled 2018-10-11: qty 50

## 2018-10-11 MED ORDER — SODIUM CHLORIDE 0.9 % IV SOLN
243.0000 mg | Freq: Once | INTRAVENOUS | Status: AC
  Administered 2018-10-11: 240 mg via INTRAVENOUS
  Filled 2018-10-11: qty 24

## 2018-10-11 MED ORDER — DIPHENHYDRAMINE HCL 12.5 MG/5ML PO ELIX
12.5000 mg | ORAL_SOLUTION | Freq: Once | ORAL | Status: AC
  Administered 2018-10-11: 12.5 mg via ORAL

## 2018-10-11 MED ORDER — DEXAMETHASONE SODIUM PHOSPHATE 10 MG/ML IJ SOLN
INTRAMUSCULAR | Status: AC
  Filled 2018-10-11: qty 1

## 2018-10-11 NOTE — Addendum Note (Signed)
Addended by: Chauncey Cruel on: 10/11/2018 12:44 PM   Modules accepted: Orders

## 2018-10-11 NOTE — Patient Instructions (Signed)
   [facility] Discharge Instructions for Patients Receiving Chemotherapy  Today you received the following chemotherapy agents Taxol and Carboplatin   To help prevent nausea and vomiting after your treatment, we encourage you to take your nausea medication as directed.    If you develop nausea and vomiting that is not controlled by your nausea medication, call the clinic.   BELOW ARE SYMPTOMS THAT SHOULD BE REPORTED IMMEDIATELY:  *FEVER GREATER THAN 100.5 F  *CHILLS WITH OR WITHOUT FEVER  NAUSEA AND VOMITING THAT IS NOT CONTROLLED WITH YOUR NAUSEA MEDICATION  *UNUSUAL SHORTNESS OF BREATH  *UNUSUAL BRUISING OR BLEEDING  TENDERNESS IN MOUTH AND THROAT WITH OR WITHOUT PRESENCE OF ULCERS  *URINARY PROBLEMS  *BOWEL PROBLEMS  UNUSUAL RASH Items with * indicate a potential emergency and should be followed up as soon as possible.  Feel free to call the clinic should you have any questions or concerns. The clinic phone number is [phone].  Please show the CHEMO ALERT CARD at check-in to the Emergency Department and triage nurse.   

## 2018-10-11 NOTE — Progress Notes (Unsigned)
Okay to treat with Hgb of 7.3 per Dr. Jana Hakim

## 2018-10-14 ENCOUNTER — Telehealth: Payer: Self-pay | Admitting: Oncology

## 2018-10-14 NOTE — Telephone Encounter (Signed)
Called regarding schedule °

## 2018-10-15 ENCOUNTER — Telehealth: Payer: Self-pay | Admitting: Oncology

## 2018-10-15 ENCOUNTER — Inpatient Hospital Stay: Payer: BLUE CROSS/BLUE SHIELD

## 2018-10-15 ENCOUNTER — Other Ambulatory Visit: Payer: Self-pay

## 2018-10-15 VITALS — BP 96/75 | HR 88 | Temp 99.1°F | Resp 18

## 2018-10-15 DIAGNOSIS — K1231 Oral mucositis (ulcerative) due to antineoplastic therapy: Secondary | ICD-10-CM | POA: Diagnosis not present

## 2018-10-15 DIAGNOSIS — C50411 Malignant neoplasm of upper-outer quadrant of right female breast: Secondary | ICD-10-CM | POA: Diagnosis not present

## 2018-10-15 DIAGNOSIS — R5383 Other fatigue: Secondary | ICD-10-CM | POA: Diagnosis not present

## 2018-10-15 DIAGNOSIS — Z171 Estrogen receptor negative status [ER-]: Secondary | ICD-10-CM | POA: Diagnosis not present

## 2018-10-15 DIAGNOSIS — Z5111 Encounter for antineoplastic chemotherapy: Secondary | ICD-10-CM | POA: Diagnosis not present

## 2018-10-15 DIAGNOSIS — C773 Secondary and unspecified malignant neoplasm of axilla and upper limb lymph nodes: Secondary | ICD-10-CM | POA: Diagnosis not present

## 2018-10-15 DIAGNOSIS — Z95828 Presence of other vascular implants and grafts: Secondary | ICD-10-CM

## 2018-10-15 DIAGNOSIS — Z5189 Encounter for other specified aftercare: Secondary | ICD-10-CM | POA: Diagnosis not present

## 2018-10-15 MED ORDER — TBO-FILGRASTIM 480 MCG/0.8ML ~~LOC~~ SOSY
480.0000 ug | PREFILLED_SYRINGE | Freq: Once | SUBCUTANEOUS | Status: AC
  Administered 2018-10-15: 480 ug via SUBCUTANEOUS

## 2018-10-15 MED ORDER — TBO-FILGRASTIM 480 MCG/0.8ML ~~LOC~~ SOSY
PREFILLED_SYRINGE | SUBCUTANEOUS | Status: AC
  Filled 2018-10-15: qty 0.8

## 2018-10-15 NOTE — Telephone Encounter (Signed)
Faxed complete medical records to [facility] to [phone]. Release ID# [id number]

## 2018-10-16 ENCOUNTER — Inpatient Hospital Stay: Payer: BLUE CROSS/BLUE SHIELD

## 2018-10-18 ENCOUNTER — Encounter: Payer: Self-pay | Admitting: Adult Health

## 2018-10-18 ENCOUNTER — Inpatient Hospital Stay: Payer: BLUE CROSS/BLUE SHIELD

## 2018-10-18 ENCOUNTER — Inpatient Hospital Stay: Payer: BLUE CROSS/BLUE SHIELD | Attending: Oncology

## 2018-10-18 ENCOUNTER — Telehealth: Payer: Self-pay | Admitting: *Deleted

## 2018-10-18 ENCOUNTER — Inpatient Hospital Stay (HOSPITAL_BASED_OUTPATIENT_CLINIC_OR_DEPARTMENT_OTHER): Payer: BLUE CROSS/BLUE SHIELD | Admitting: Adult Health

## 2018-10-18 ENCOUNTER — Other Ambulatory Visit: Payer: Self-pay

## 2018-10-18 VITALS — BP 113/88 | HR 96 | Temp 98.1°F | Resp 18 | Ht 64.0 in | Wt 143.3 lb

## 2018-10-18 DIAGNOSIS — Z171 Estrogen receptor negative status [ER-]: Secondary | ICD-10-CM

## 2018-10-18 DIAGNOSIS — C50411 Malignant neoplasm of upper-outer quadrant of right female breast: Secondary | ICD-10-CM | POA: Diagnosis not present

## 2018-10-18 DIAGNOSIS — Z9221 Personal history of antineoplastic chemotherapy: Secondary | ICD-10-CM | POA: Diagnosis not present

## 2018-10-18 DIAGNOSIS — Z79899 Other long term (current) drug therapy: Secondary | ICD-10-CM | POA: Insufficient documentation

## 2018-10-18 DIAGNOSIS — Z5111 Encounter for antineoplastic chemotherapy: Secondary | ICD-10-CM | POA: Insufficient documentation

## 2018-10-18 DIAGNOSIS — Z923 Personal history of irradiation: Secondary | ICD-10-CM

## 2018-10-18 DIAGNOSIS — Z95828 Presence of other vascular implants and grafts: Secondary | ICD-10-CM

## 2018-10-18 LAB — CBC WITH DIFFERENTIAL/PLATELET
Abs Immature Granulocytes: 0.03 10*3/uL (ref 0.00–0.07)
Basophils Absolute: 0 10*3/uL (ref 0.0–0.1)
Basophils Relative: 0 %
Eosinophils Absolute: 0 10*3/uL (ref 0.0–0.5)
Eosinophils Relative: 0 %
HCT: 23.6 % — ABNORMAL LOW (ref 36.0–46.0)
Hemoglobin: 7.8 g/dL — ABNORMAL LOW (ref 12.0–15.0)
Immature Granulocytes: 1 %
Lymphocytes Relative: 13 %
Lymphs Abs: 0.6 10*3/uL — ABNORMAL LOW (ref 0.7–4.0)
MCH: 34.7 pg — ABNORMAL HIGH (ref 26.0–34.0)
MCHC: 33.1 g/dL (ref 30.0–36.0)
MCV: 104.9 fL — ABNORMAL HIGH (ref 80.0–100.0)
Monocytes Absolute: 0.3 10*3/uL (ref 0.1–1.0)
Monocytes Relative: 6 %
Neutro Abs: 3.9 10*3/uL (ref 1.7–7.7)
Neutrophils Relative %: 80 %
Platelets: 120 10*3/uL — ABNORMAL LOW (ref 150–400)
RBC: 2.25 MIL/uL — ABNORMAL LOW (ref 3.87–5.11)
RDW: 16.3 % — ABNORMAL HIGH (ref 11.5–15.5)
WBC: 4.9 10*3/uL (ref 4.0–10.5)
nRBC: 0 % (ref 0.0–0.2)

## 2018-10-18 LAB — CMP (CANCER CENTER ONLY)
ALT: 18 U/L (ref 0–44)
AST: 16 U/L (ref 15–41)
Albumin: 4 g/dL (ref 3.5–5.0)
Alkaline Phosphatase: 83 U/L (ref 38–126)
Anion gap: 9 (ref 5–15)
BUN: 12 mg/dL (ref 6–20)
CO2: 25 mmol/L (ref 22–32)
Calcium: 9 mg/dL (ref 8.9–10.3)
Chloride: 105 mmol/L (ref 98–111)
Creatinine: 0.68 mg/dL (ref 0.44–1.00)
GFR, Est AFR Am: >60 mL/min (ref >60)
GFR, Estimated: >60 mL/min (ref >60)
Glucose, Bld: 106 mg/dL — ABNORMAL HIGH (ref 70–99)
Potassium: 3.8 mmol/L (ref 3.5–5.1)
Sodium: 139 mmol/L (ref 135–145)
Total Bilirubin: 0.7 mg/dL (ref 0.3–1.2)
Total Protein: 6.7 g/dL (ref 6.5–8.1)

## 2018-10-18 MED ORDER — SODIUM CHLORIDE 0.9 % IV SOLN
243.0000 mg | Freq: Once | INTRAVENOUS | Status: AC
  Administered 2018-10-18: 15:00:00 240 mg via INTRAVENOUS
  Filled 2018-10-18: qty 24

## 2018-10-18 MED ORDER — SODIUM CHLORIDE 0.9% FLUSH
10.0000 mL | INTRAVENOUS | Status: DC | PRN
  Administered 2018-10-18: 16:00:00 10 mL
  Filled 2018-10-18: qty 10

## 2018-10-18 MED ORDER — DIPHENHYDRAMINE HCL 12.5 MG/5ML PO ELIX
12.5000 mg | ORAL_SOLUTION | Freq: Once | ORAL | Status: AC
  Administered 2018-10-18: 13:00:00 12.5 mg via ORAL

## 2018-10-18 MED ORDER — SODIUM CHLORIDE 0.9% FLUSH
10.0000 mL | INTRAVENOUS | Status: DC | PRN
  Administered 2018-10-18: 10 mL
  Filled 2018-10-18: qty 10

## 2018-10-18 MED ORDER — HEPARIN SOD (PORK) LOCK FLUSH 100 UNIT/ML IV SOLN
500.0000 [IU] | Freq: Once | INTRAVENOUS | Status: AC | PRN
  Administered 2018-10-18: 16:00:00 500 [IU]
  Filled 2018-10-18: qty 5

## 2018-10-18 MED ORDER — DEXAMETHASONE SODIUM PHOSPHATE 10 MG/ML IJ SOLN
INTRAMUSCULAR | Status: AC
  Filled 2018-10-18: qty 1

## 2018-10-18 MED ORDER — DEXAMETHASONE SODIUM PHOSPHATE 10 MG/ML IJ SOLN
4.0000 mg | Freq: Once | INTRAMUSCULAR | Status: AC
  Administered 2018-10-18: 4 mg via INTRAVENOUS

## 2018-10-18 MED ORDER — SODIUM CHLORIDE 0.9 % IV SOLN
80.0000 mg/m2 | Freq: Once | INTRAVENOUS | Status: AC
  Administered 2018-10-18: 138 mg via INTRAVENOUS
  Filled 2018-10-18: qty 23

## 2018-10-18 MED ORDER — FAMOTIDINE IN NACL 20-0.9 MG/50ML-% IV SOLN
INTRAVENOUS | Status: AC
  Filled 2018-10-18: qty 50

## 2018-10-18 MED ORDER — SODIUM CHLORIDE 0.9 % IV SOLN
Freq: Once | INTRAVENOUS | Status: AC
  Administered 2018-10-18: 13:00:00 via INTRAVENOUS
  Filled 2018-10-18: qty 250

## 2018-10-18 MED ORDER — DIPHENHYDRAMINE HCL 12.5 MG/5ML PO ELIX
ORAL_SOLUTION | ORAL | Status: AC
  Filled 2018-10-18: qty 5

## 2018-10-18 MED ORDER — PALONOSETRON HCL INJECTION 0.25 MG/5ML
0.2500 mg | Freq: Once | INTRAVENOUS | Status: AC
  Administered 2018-10-18: 0.25 mg via INTRAVENOUS

## 2018-10-18 MED ORDER — SODIUM CHLORIDE 0.9 % IV SOLN
INTRAVENOUS | Status: DC
  Filled 2018-10-18: qty 250

## 2018-10-18 MED ORDER — PALONOSETRON HCL INJECTION 0.25 MG/5ML
INTRAVENOUS | Status: AC
  Filled 2018-10-18: qty 5

## 2018-10-18 MED ORDER — FAMOTIDINE IN NACL 20-0.9 MG/50ML-% IV SOLN
20.0000 mg | Freq: Once | INTRAVENOUS | Status: AC
  Administered 2018-10-18: 13:00:00 20 mg via INTRAVENOUS

## 2018-10-18 MED ORDER — ACETAMINOPHEN 325 MG PO TABS
650.0000 mg | ORAL_TABLET | Freq: Once | ORAL | Status: AC
  Administered 2018-10-18: 13:00:00 650 mg via ORAL

## 2018-10-18 MED ORDER — ACETAMINOPHEN 325 MG PO TABS
ORAL_TABLET | ORAL | Status: AC
  Filled 2018-10-18: qty 2

## 2018-10-18 NOTE — Patient Instructions (Signed)
[facility] Discharge Instructions for Patients Receiving Chemotherapy  Today you received the following chemotherapy agents: Taxol, Carboplatin  To help prevent nausea and vomiting after your treatment, we encourage you to take your nausea medication: As directed by MD   If you develop nausea and vomiting that is not controlled by your nausea medication, call the clinic.   BELOW ARE SYMPTOMS THAT SHOULD BE REPORTED IMMEDIATELY:  *FEVER GREATER THAN 100.5 F  *CHILLS WITH OR WITHOUT FEVER  NAUSEA AND VOMITING THAT IS NOT CONTROLLED WITH YOUR NAUSEA MEDICATION  *UNUSUAL SHORTNESS OF BREATH  *UNUSUAL BRUISING OR BLEEDING  TENDERNESS IN MOUTH AND THROAT WITH OR WITHOUT PRESENCE OF ULCERS  *URINARY PROBLEMS  *BOWEL PROBLEMS  UNUSUAL RASH Items with * indicate a potential emergency and should be followed up as soon as possible.  Feel free to call the clinic should you have any questions or concerns. The clinic phone number is [phone].  Please show the CHEMO ALERT CARD at check-in to the Emergency Department and triage nurse.   

## 2018-10-18 NOTE — Progress Notes (Signed)
Per Dr. Melford Aase ok to treat with Hemoglobin 7.8.

## 2018-10-18 NOTE — Patient Instructions (Signed)

## 2018-10-18 NOTE — Patient Instructions (Signed)
Anemia  Anemia is a condition in which you do not have enough red blood cells or hemoglobin. Hemoglobin is a substance in red blood cells that carries oxygen. When you do not have enough red blood cells or hemoglobin (are anemic), your body cannot get enough oxygen and your organs may not work properly. As a result, you may feel very tired or have other problems. What are the causes? Common causes of anemia include:  Excessive bleeding. Anemia can be caused by excessive bleeding inside or outside the body, including bleeding from the intestine or from periods in women.  Poor nutrition.  Long-lasting (chronic) kidney, thyroid, and liver disease.  Bone marrow disorders.  Cancer and treatments for cancer.  HIV (human immunodeficiency virus) and AIDS (acquired immunodeficiency syndrome).  Treatments for HIV and AIDS.  Spleen problems.  Blood disorders.  Infections, medicines, and autoimmune disorders that destroy red blood cells. What are the signs or symptoms? Symptoms of this condition include:  Minor weakness.  Dizziness.  Headache.  Feeling heartbeats that are irregular or faster than normal (palpitations).  Shortness of breath, especially with exercise.  Paleness.  Cold sensitivity.  Indigestion.  Nausea.  Difficulty sleeping.  Difficulty concentrating. Symptoms may occur suddenly or develop slowly. If your anemia is mild, you may not have symptoms. How is this diagnosed? This condition is diagnosed based on:  Blood tests.  Your medical history.  A physical exam.  Bone marrow biopsy. Your health care provider may also check your stool (feces) for blood and may do additional testing to look for the cause of your bleeding. You may also have other tests, including:  Imaging tests, such as a CT scan or MRI.  Endoscopy.  Colonoscopy. How is this treated? Treatment for this condition depends on the cause. If you continue to lose a lot of blood, you may  need to be treated at a hospital. Treatment may include:  Taking supplements of iron, vitamin S31, or folic acid.  Taking a hormone medicine (erythropoietin) that can help to stimulate red blood cell growth.  Having a blood transfusion. This may be needed if you lose a lot of blood.  Making changes to your diet.  Having surgery to remove your spleen. Follow these instructions at home:  Take over-the-counter and prescription medicines only as told by your health care provider.  Take supplements only as told by your health care provider.  Follow any diet instructions that you were given.  Keep all follow-up visits as told by your health care provider. This is important. Contact a health care provider if:  You develop new bleeding anywhere in the body. Get help right away if:  You are very weak.  You are short of breath.  You have pain in your abdomen or chest.  You are dizzy or feel faint.  You have trouble concentrating.  You have bloody or black, tarry stools.  You vomit repeatedly or you vomit up blood. Summary  Anemia is a condition in which you do not have enough red blood cells or enough of a substance in your red blood cells that carries oxygen (hemoglobin).  Symptoms may occur suddenly or develop slowly.  If your anemia is mild, you may not have symptoms.  This condition is diagnosed with blood tests as well as a medical history and physical exam. Other tests may be needed.  Treatment for this condition depends on the cause of the anemia. This information is not intended to replace advice given to you by  your health care provider. Make sure you discuss any questions you have with your health care provider. Document Released: 07/13/2004 Document Revised: 07/07/2016 Document Reviewed: 07/07/2016 Elsevier Interactive Patient Education  2019 [employer].

## 2018-10-18 NOTE — Telephone Encounter (Signed)
Called pt to congratulate on final chemo today. Denies questions, concerns or needs at this time. Discussed with pt that Dr. Jana Hakim or Mendel Ryder will call with MRI results. Received verbal understanding.

## 2018-10-18 NOTE — Progress Notes (Signed)
[facility]  Telephone:(336) [phone] Fax:(336) [phone]    ID: Emmarose Klinke Beatrice-Mitchell DOB: Jan 25, 1976  MR#: [id number]  POE#:[id number]  Patient Care Team: Jana Hakim Virgie Dad, MD as PCP - General (Oncology) Rolm Bookbinder, MD as Consulting Physician (General Surgery) Magrinat, Virgie Dad, MD as Consulting Physician (Oncology) Gery Pray, MD as Consulting Physician (Radiation Oncology) Emily Filbert, MD as Consulting Physician (Obstetrics and Gynecology) Lavonna Monarch, MD as Consulting Physician (Dermatology) Mauro Kaufmann, RN as Oncology Nurse Navigator Rockwell Germany, RN as Oncology Nurse Navigator OTHER MD:   CHIEF COMPLAINT: Triple negative breast cancer  CURRENT TREATMENT: Neoadjuvant chemotherapy   HISTORY OF CURRENT ILLNESS:  Yared Susan Beatrice-Mitchell had routine screening mammography on 04/19/2018 showing a possible abnormality in the right breast. She underwent unilateral right diagnostic mammography with tomography and right breast ultrasonography at [facility] on 05/02/2018 showing: Breast Density Category C: There was an oval circumscribed mass in the posterior aspect of the upper outer right breast.  On exam this measured 3.5 cm, was firm, irregular and centered on 10-11 o'clock 5 cm from the nipple.  By ultrasonography there was a 3.7 x 1.7 x 1.5 cm elongated, irregular, hypoechoic mass in the 10:30 o'clock position of the right breast, 5 cm from the nipple.  Ultrasound of the right axilla showed 7 abnormal right axillary lymph nodes highly suspicious for metastatic nodes.  Accordingly on 05/03/2018 she proceeded to biopsy of the right breast area in question and one suspicious lymph node. The pathology from this procedure showed ([id number]): Right Breast: invasive mammary carcinoma, grade 3, E-cadherin positive (and therefore ductal) lymphovascular invasion present. Lymph node: Invasive mammary carcinoma Prognostic indicators significant  for: estrogen receptor, 0% negative and progesterone receptor, 0% negative. Proliferation marker Ki67 at 75%. HER2 negative (1+).  The patient's subsequent history is as detailed below.   INTERVAL HISTORY: Quinetta returns today for follow-up and treatment of her triple negative breast cancer.   She continues on neoadjuvant chemotherapy consisting of paclitaxel and carboplatin weekly x12. Today is day 1 cycle 12.  She is schedule for a bilateral breast MRI on 10/22/2018, with f/u with Dr. Donne Hazel the next day.  She is feeling moderately well.  She continues to deny peripheral neuropathy.     REVIEW OF SYSTEMS: Adyline is fatigued.  Her hemoglobin is 7.8 and she has been eating iron rich foods since her last treatment, which has increased it from 7.3.  She is slightly light headed on occasion.  She is eating and drinking well.  She has diarrhea about 2 days following chemotherapy and notes that she has this managed with imodium if needed.  She denies any cough, fever, chills, shortness of breath, chest pain, or palpitations.  She is without nausea, vomiting, or bladder changes.  A detailed ROS was otherwise non contributory.      PAST MEDICAL HISTORY: Past Medical History:  Diagnosis Date   Abnormal Pap smear    ASCUS 03/13/11   Anemia    Breast pain, left    Cancer (HCC)    Candida vaginitis    Complication of anesthesia    pt states that all narcotics make her angry and she would prefer to avoid them.   Rectal itching    Vaginal Pap smear, abnormal     PAST SURGICAL HISTORY: Past Surgical History:  Procedure Laterality Date   BREAST BIOPSY Right 8/16   CESAREAN SECTION N/A 11/21/2013   Procedure: CESAREAN SECTION;  Surgeon: Alwyn Pea, MD;  Location: [facility];  Service: Obstetrics;  Laterality: N/A;   CESAREAN SECTION N/A 06/23/2016   Procedure: CESAREAN SECTION;  Surgeon: Emily Filbert, MD;  Location: [facility];  Service: Obstetrics;  Laterality: N/A;    HAND SURGERY  04/2012   ligament repair   PILONIDAL CYST EXCISION  2000   PORTACATH PLACEMENT N/A 05/28/2018   Procedure: INSERTION PORT-A-CATH WITH ULTRASOUND;  Surgeon: Rolm Bookbinder, MD;  Location: [facility];  Service: General;  Laterality: N/A;   WISDOM TOOTH EXTRACTION  AGE 38    FAMILY HISTORY: Family History  Problem Relation Age of Onset   Cancer Mother 24       breast, premenopausal   Breast cancer Mother    Cancer Maternal Grandmother        skin, stomach   Cancer Maternal Grandfather        lung   As of November 2019 her father is 19 years old with a history of Parkinson's. Patients' mother is 47 years old with a history of breast cancer, diagnosed age 31, treated with radical mastectomy. The patient has no brothers and two sisters. Besides the patient's mother, the patient has no other family medical history of breast or ovarian cancer. Her maternal grandmother had stomach cancer and her maternal grandfather had lung cancer, --he was a smoker.    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  Her husband, [name], an Chief Financial Officer. She has two children, [name], age 10, and [name], age 30 months.  The patient is not a smoker. She does drink alcohol and drinks an estimated one to two drinks per week.     ADVANCED DIRECTIVES: [name], her husband, is her healthcare department   HEALTH MAINTENANCE: Social History   Tobacco Use   Smoking status: Never Smoker   Smokeless tobacco: Never Used  Substance Use Topics   Alcohol use: Yes    Alcohol/week: 0.0 standard drinks    Comment: occassionally   Drug use: No    Colonoscopy: no  PAP: 12/2017  Bone density: no   Allergies    Allergen Reactions   Augmentin [Amoxicillin-Pot Clavulanate]     diarrhea   Hydrocodone Itching   Keflex [Cephalexin] Diarrhea    Current Outpatient Medications  Medication Sig Dispense Refill   acyclovir cream (ZOVIRAX) 5 % Apply 1 application topically every 3 (three) hours. 15 g 2   ciprofloxacin (CIPRO) 500 MG tablet Take one tablet by mouth 2 times daily for 5 days after each chemo cycle, starting on day 8 of chemo 40 tablet 1   cyclobenzaprine (FLEXERIL) 10 MG tablet Take 0.5-1 tablets (5-10 mg total) by mouth 3 (three) times daily as needed for muscle spasms. 30 tablet 1   dexamethasone (DECADRON) 4 MG tablet Take 2 tablets by mouth once a day on the day after chemotherapy and then take 2 tablets two times a day for 2 days. Take with food. 30 tablet 1   famotidine (PEPCID) 20 MG tablet Take 1 tablet (20 mg total) by mouth 2 (two) times daily.     fluticasone (FLONASE) 50 MCG/ACT nasal spray Place  2 sprays into both nostrils daily. 16 g 2   lidocaine (XYLOCAINE) 2 % solution Use as directed 5 mLs in the mouth or throat every 3 (three) hours as needed for mouth pain. 200 mL 1   lidocaine-prilocaine (EMLA) cream Apply to affected area once 30 g 3   loratadine (CLARITIN) 10 MG tablet Take 10 mg by mouth daily as needed for allergies.     LORazepam (ATIVAN) 0.5 MG tablet Take 1 tablet (0.5 mg total) by mouth at bedtime as needed (Nausea or vomiting). 30 tablet 0   polyethylene glycol (MIRALAX) 0.34 gm/ml SOLN      Probiotic Product (PROBIOTIC DAILY PO) Take 1 tablet by mouth.     prochlorperazine (COMPAZINE) 10 MG tablet Take 1 tablet (10 mg total) by mouth every 6 (six) hours as needed (Nausea or vomiting). 30 tablet 1   sucralfate (CARAFATE) 1 g tablet Take 1 tablet (1 g total) by mouth 4 (four) times daily -  with meals and at bedtime. 40 tablet 0   No current facility-administered medications for this visit.    Facility-Administered Medications Ordered in Other  Visits  Medication Dose Route Frequency Provider Last Rate Last Dose   0.9 %  sodium chloride infusion   Intravenous Continuous Mykenzie Ebanks, Charlestine Massed, NP       CARBOplatin (PARAPLATIN) 240 mg in sodium chloride 0.9 % 250 mL chemo infusion  240 mg Intravenous Once Magrinat, Virgie Dad, MD 548 mL/hr at 10/18/18 1523 240 mg at 10/18/18 1523   heparin lock flush 100 unit/mL  500 Units Intracatheter Once PRN Magrinat, Virgie Dad, MD       sodium chloride flush (NS) 0.9 % injection 10 mL  10 mL Intracatheter PRN Magrinat, Virgie Dad, MD         OBJECTIVE:  Vitals:   10/18/18 1150  BP: 113/88  Pulse: 96  Resp: 18  Temp: 98.1 F (36.7 C)  SpO2: 100%     Body mass index is 24.6 kg/m.   Wt Readings from Last 3 Encounters:  10/18/18 143 lb 4.8 oz (65 kg)  10/11/18 144 lb 11.2 oz (65.6 kg)  10/04/18 143 lb 9.6 oz (65.1 kg)  ECOG FS: 1 GENERAL: Patient is a well appearing female in no acute distress HEENT:  Sclerae anicteric.  Oropharynx clear and moist. No ulcerations or evidence of oropharyngeal candidiasis. Neck is supple.  NODES:  No cervical, supraclavicular, or axillary lymphadenopathy palpated.  BREAST EXAM:  Deferred. LUNGS:  Clear to auscultation bilaterally.  No wheezes or rhonchi. HEART:  Regular rate and rhythm. No murmur appreciated. ABDOMEN:  Soft, nontender.  Positive, normoactive bowel sounds. No organomegaly palpated. MSK:  No focal spinal tenderness to palpation. Full range of motion bilaterally in the upper extremities. EXTREMITIES:  No peripheral edema.   SKIN:  Clear with no obvious rashes or skin changes. No nail dyscrasia. NEURO:  Nonfocal. Well oriented.  Appropriate affect.    LAB RESULTS:  CMP     Component Value Date/Time   NA 139 10/18/2018 1108   K 3.8 10/18/2018 1108   CL 105 10/18/2018 1108   CO2 25 10/18/2018 1108   GLUCOSE 106 (H) 10/18/2018 1108   BUN 12 10/18/2018 1108   CREATININE 0.68 10/18/2018 1108   CREATININE 0.78 01/15/2018 1033    CALCIUM 9.0 10/18/2018 1108   PROT 6.7 10/18/2018 1108   ALBUMIN 4.0 10/18/2018 1108   AST 16 10/18/2018 1108   ALT 18 10/18/2018 1108   ALKPHOS 83 10/18/2018  1108   BILITOT 0.7 10/18/2018 1108   GFRNONAA >60 10/18/2018 1108   GFRNONAA 95 01/15/2018 1033   GFRAA >60 10/18/2018 1108   GFRAA 110 01/15/2018 1033    No results found for: TOTALPROTELP, ALBUMINELP, A1GS, A2GS, BETS, BETA2SER, GAMS, MSPIKE, SPEI  No results found for: KPAFRELGTCHN, LAMBDASER, KAPLAMBRATIO  Lab Results  Component Value Date   WBC 4.9 10/18/2018   NEUTROABS 3.9 10/18/2018   HGB 7.8 (L) 10/18/2018   HCT 23.6 (L) 10/18/2018   MCV 104.9 (H) 10/18/2018   PLT 120 (L) 10/18/2018    '@LASTCHEMISTRY'$ @  No results found for: LABCA2  No components found for: XYVOPF292  No results for input(s): INR in the last 168 hours.  No results found for: LABCA2  No results found for: KMQ286  No results found for: NOT771  No results found for: HAF790  No results found for: CA2729  No components found for: HGQUANT  No results found for: CEA1 / No results found for: CEA1   No results found for: AFPTUMOR  No results found for: CHROMOGRNA  No results found for: PSA1  Appointment on 10/18/2018  Component Date Value Ref Range Status   WBC 10/18/2018 4.9  4.0 - 10.5 K/uL Final   RBC 10/18/2018 2.25* 3.87 - 5.11 MIL/uL Final   Hemoglobin 10/18/2018 7.8* 12.0 - 15.0 g/dL Final   HCT 10/18/2018 23.6* 36.0 - 46.0 % Final   MCV 10/18/2018 104.9* 80.0 - 100.0 fL Final   MCH 10/18/2018 34.7* 26.0 - 34.0 pg Final   MCHC 10/18/2018 33.1  30.0 - 36.0 g/dL Final   RDW 10/18/2018 16.3* 11.5 - 15.5 % Final   Platelets 10/18/2018 120* 150 - 400 K/uL Final   nRBC 10/18/2018 0.0  0.0 - 0.2 % Final   Neutrophils Relative % 10/18/2018 80  % Final   Neutro Abs 10/18/2018 3.9  1.7 - 7.7 K/uL Final   Lymphocytes Relative 10/18/2018 13  % Final   Lymphs Abs 10/18/2018 0.6* 0.7 - 4.0 K/uL Final   Monocytes  Relative 10/18/2018 6  % Final   Monocytes Absolute 10/18/2018 0.3  0.1 - 1.0 K/uL Final   Eosinophils Relative 10/18/2018 0  % Final   Eosinophils Absolute 10/18/2018 0.0  0.0 - 0.5 K/uL Final   Basophils Relative 10/18/2018 0  % Final   Basophils Absolute 10/18/2018 0.0  0.0 - 0.1 K/uL Final   Immature Granulocytes 10/18/2018 1  % Final   Abs Immature Granulocytes 10/18/2018 0.03  0.00 - 0.07 K/uL Final   Performed at [facility], [place] [address] [place], [place] [address]   Sodium 10/18/2018 139  135 - 145 mmol/L Final   Potassium 10/18/2018 3.8  3.5 - 5.1 mmol/L Final   Chloride 10/18/2018 105  98 - 111 mmol/L Final   CO2 10/18/2018 25  22 - 32 mmol/L Final   Glucose, Bld 10/18/2018 106* 70 - 99 mg/dL Final   BUN 10/18/2018 12  6 - 20 mg/dL Final   Creatinine 10/18/2018 0.68  0.44 - 1.00 mg/dL Final   Calcium 10/18/2018 9.0  8.9 - 10.3 mg/dL Final   Total Protein 10/18/2018 6.7  6.5 - 8.1 g/dL Final   Albumin 10/18/2018 4.0  3.5 - 5.0 g/dL Final   AST 10/18/2018 16  15 - 41 U/L Final   ALT 10/18/2018 18  0 - 44 U/L Final   Alkaline Phosphatase 10/18/2018 83  38 - 126 U/L Final   Total Bilirubin 10/18/2018 0.7  0.3 - 1.2 mg/dL Final   GFR, Est Non Af Am 10/18/2018 >60  >60 mL/min Final   GFR, Est AFR Am 10/18/2018 >60  >60 mL/min Final   Anion gap 10/18/2018 9  5 - 15 Final   Performed at [facility], [address] [place], [place] [address]    (this displays the last labs from the last 3 days)  No results found for: TOTALPROTELP, ALBUMINELP, A1GS, A2GS, BETS, BETA2SER, GAMS, MSPIKE, SPEI (this displays SPEP labs)  No results found for: KPAFRELGTCHN, LAMBDASER, KAPLAMBRATIO (kappa/lambda light chains)  No results found for: HGBA, HGBA2QUANT, HGBFQUANT, HGBSQUAN (Hemoglobinopathy evaluation)   No results found for: LDH  Lab Results  Component Value Date   IRON 64 07/15/2018   TIBC 265  07/15/2018   IRONPCTSAT 24 07/15/2018   (Iron and TIBC)  Lab Results  Component Value Date   FERRITIN 92 07/15/2018    Urinalysis    Component Value Date/Time   COLORURINE YELLOW 12/18/2016 [place] 12/18/2016 1607   LABSPEC 1.010 12/18/2016 1607   PHURINE 7.0 12/18/2016 [place] 12/18/2016 1607   HGBUR NEGATIVE 12/18/2016 1607   HGBUR negative 01/13/2010 0940   BILIRUBINUR NEGATIVE 12/18/2016 1607   BILIRUBINUR neg 05/17/2015 [place] 12/18/2016 1607   PROTEINUR NEGATIVE 12/18/2016 1607   UROBILINOGEN 0.2 05/17/2015 1034   UROBILINOGEN 0.2 01/13/2010 0940   NITRITE NEGATIVE 12/18/2016 1607   LEUKOCYTESUR NEGATIVE 12/18/2016 1607    STUDIES:  Preop MRI to be scheduled  ELIGIBLE FOR AVAILABLE RESEARCH PROTOCOL: S 1418   ASSESSMENT: 43 y.o. [place] woman, status post right breast upper outer quadrant and right axillary lymph node biopsy 05/03/2018 for a clinical T2 N2, stage IIIC invasive ductal carcinoma, triple negative, with an MIB-1 of 75%  (a) staging studies with CT scans of the chest abdomen and pelvis on 05/29/18 show no distant metastases; bone scan shows questionable focus of tracer accumulation on the proximal to mid right femoral diaphysis  (b) MRI breast on 05/31/18 shows additional non mass like enhancement throughout upper outer right breast that extends 5cm inferomedially from biopsy proven malignancy.    (1) genetics testing reported 06/03/2018 at [facility] through the in [place] breast cancer guidelines based panel (11 genes analyzed) found no deleterious mutations and no variants of uncertain significance in ATM, BRCA1, BRCA2, CDH 1, CH EK 2, NBN, NF1, PALB 2, PT EN, STK 11, TP 53 06/06/2018  (2) neoadjuvant chemotherapy consisting of doxorubicin and cyclophosphamide in dose dense fashion x4 started 06/03/2018, completed 07/15/2018, followed by paclitaxel and carboplatin weekly x12 starting  08/02/2018 (a) Echo on 05/23/2018 shows an EF of 60-65%.  (3) definitive surgery to follow (patient is planning on bilateral mastectomies with no reconstruction)  (4) adjuvant radiation to follow surgery  (5) may be a candidate for S 1418  (6) consider adjuvant capecitabine depending on final surgical results   PLAN: Deidre Ala is doing well today.  She will complete her neoadjuvant chemotherapy today by receiving her 12th cycle of Paclitaxel and Carboplatin.  I congratulated her on completing her therapy.  Hilary's hemoglobin is 7.8.  She will receive chemo today despite this.  She did not receive blood last week, and it has increased from 7.3.  She understands that she may very well need a transfusion prior to surgery since she is planning a double mastectomy.  I gave her a handout on anemia and signs and symptoms to be  aware of to call us about.  Sharlon will continue taking imodium if needed for diarrhea.  Haili will follow up with Dr. Donne Hazel on Wednesday of next week.  She will return to see Dr. Jana Hakim on 5/15.  She knows to call for any other issue that may develop before the next visit here  A total of (30) minutes of face-to-face time was spent with this patient with greater than 50% of that time in counseling and care-coordination.   Wilber Bihari, NP  10/18/18 3:24 PM Medical Oncology and Hematology [facility] [address] [place], [place] [address] Tel. [phone]    Fax. [phone]

## 2018-10-18 NOTE — Progress Notes (Signed)
OK to treat with hemoglobin of 7.8.  Darlene Bihari, NP

## 2018-10-22 ENCOUNTER — Other Ambulatory Visit: Payer: Self-pay

## 2018-10-22 ENCOUNTER — Encounter: Payer: Self-pay | Admitting: Adult Health

## 2018-10-22 ENCOUNTER — Ambulatory Visit
Admission: RE | Admit: 2018-10-22 | Discharge: 2018-10-22 | Disposition: A | Discharge status: Home / Self Care | Payer: BLUE CROSS/BLUE SHIELD | Source: Ambulatory Visit | Attending: Oncology | Admitting: Oncology

## 2018-10-22 DIAGNOSIS — Z171 Estrogen receptor negative status [ER-]: Secondary | ICD-10-CM

## 2018-10-22 DIAGNOSIS — C50411 Malignant neoplasm of upper-outer quadrant of right female breast: Secondary | ICD-10-CM | POA: Diagnosis not present

## 2018-10-22 MED ORDER — GADOBUTROL 1 MMOL/ML IV SOLN
6.0000 mL | Freq: Once | INTRAVENOUS | Status: AC | PRN
  Administered 2018-10-22: 15:00:00 6 mL via INTRAVENOUS

## 2018-10-23 ENCOUNTER — Encounter: Payer: Self-pay | Admitting: *Deleted

## 2018-10-23 ENCOUNTER — Other Ambulatory Visit: Payer: Self-pay | Admitting: Adult Health

## 2018-10-23 DIAGNOSIS — C50411 Malignant neoplasm of upper-outer quadrant of right female breast: Secondary | ICD-10-CM | POA: Diagnosis not present

## 2018-10-23 DIAGNOSIS — R790 Abnormal level of blood mineral: Secondary | ICD-10-CM

## 2018-10-23 DIAGNOSIS — Z803 Family history of malignant neoplasm of breast: Secondary | ICD-10-CM | POA: Diagnosis not present

## 2018-10-25 ENCOUNTER — Telehealth: Payer: Self-pay | Admitting: *Deleted

## 2018-10-25 ENCOUNTER — Other Ambulatory Visit: Payer: Self-pay | Admitting: Oncology

## 2018-10-25 NOTE — Telephone Encounter (Signed)
Spoke to pt regarding upcoming sx and future appt for post op with Dr. Lindi Adie. Denies questions or concerns at this time. Encourage pt to call with needs. Received verbal understanding.

## 2018-10-28 ENCOUNTER — Encounter: Payer: Self-pay | Admitting: *Deleted

## 2018-10-28 ENCOUNTER — Telehealth: Payer: Self-pay | Admitting: Hematology and Oncology

## 2018-10-28 ENCOUNTER — Telehealth: Payer: Self-pay | Admitting: *Deleted

## 2018-10-28 DIAGNOSIS — C50411 Malignant neoplasm of upper-outer quadrant of right female breast: Secondary | ICD-10-CM

## 2018-10-28 DIAGNOSIS — Z171 Estrogen receptor negative status [ER-]: Secondary | ICD-10-CM

## 2018-10-28 NOTE — Telephone Encounter (Signed)
Per 5/11 schedule message added f/u with LT 5/12 and moved 5/15 lab to 5/12. Confirmed with patient.

## 2018-10-28 NOTE — Telephone Encounter (Signed)
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 Schedule msg placed for pt to see Nel[facility] [place]  5/12. Informed pt she will receive a call with an appt date and time and that we will draw her labs as well.

## 2018-10-29 ENCOUNTER — Inpatient Hospital Stay: Payer: BLUE CROSS/BLUE SHIELD

## 2018-10-29 ENCOUNTER — Other Ambulatory Visit: Payer: Self-pay | Admitting: General Surgery

## 2018-10-29 ENCOUNTER — Inpatient Hospital Stay (HOSPITAL_BASED_OUTPATIENT_CLINIC_OR_DEPARTMENT_OTHER): Payer: BLUE CROSS/BLUE SHIELD | Admitting: Adult Health

## 2018-10-29 ENCOUNTER — Other Ambulatory Visit: Payer: Self-pay

## 2018-10-29 VITALS — BP 111/68 | HR 89 | Temp 98.5°F | Resp 20 | Ht 64.0 in | Wt 146.1 lb

## 2018-10-29 DIAGNOSIS — C50411 Malignant neoplasm of upper-outer quadrant of right female breast: Secondary | ICD-10-CM | POA: Diagnosis not present

## 2018-10-29 DIAGNOSIS — Z9221 Personal history of antineoplastic chemotherapy: Secondary | ICD-10-CM

## 2018-10-29 DIAGNOSIS — Z79899 Other long term (current) drug therapy: Secondary | ICD-10-CM | POA: Diagnosis not present

## 2018-10-29 DIAGNOSIS — Z171 Estrogen receptor negative status [ER-]: Secondary | ICD-10-CM

## 2018-10-29 DIAGNOSIS — R790 Abnormal level of blood mineral: Secondary | ICD-10-CM

## 2018-10-29 DIAGNOSIS — Z5111 Encounter for antineoplastic chemotherapy: Secondary | ICD-10-CM | POA: Diagnosis not present

## 2018-10-29 LAB — COMPREHENSIVE METABOLIC PANEL
ALT: 14 U/L (ref 0–44)
AST: 14 U/L — ABNORMAL LOW (ref 15–41)
Albumin: 4.3 g/dL (ref 3.5–5.0)
Alkaline Phosphatase: 68 U/L (ref 38–126)
Anion gap: 8 (ref 5–15)
BUN: 13 mg/dL (ref 6–20)
CO2: 27 mmol/L (ref 22–32)
Calcium: 9.6 mg/dL (ref 8.9–10.3)
Chloride: 105 mmol/L (ref 98–111)
Creatinine, Ser: 0.84 mg/dL (ref 0.44–1.00)
GFR calc Af Amer: >60 mL/min (ref >60)
GFR calc non Af Amer: >60 mL/min (ref >60)
Glucose, Bld: 94 mg/dL (ref 70–99)
Potassium: 4.4 mmol/L (ref 3.5–5.1)
Sodium: 140 mmol/L (ref 135–145)
Total Bilirubin: 0.9 mg/dL (ref 0.3–1.2)
Total Protein: 7.1 g/dL (ref 6.5–8.1)

## 2018-10-29 LAB — CBC WITH DIFFERENTIAL/PLATELET
Abs Immature Granulocytes: 0.01 10*3/uL (ref 0.00–0.07)
Basophils Absolute: 0 10*3/uL (ref 0.0–0.1)
Basophils Relative: 1 %
Eosinophils Absolute: 0 10*3/uL (ref 0.0–0.5)
Eosinophils Relative: 2 %
HCT: 23.8 % — ABNORMAL LOW (ref 36.0–46.0)
Hemoglobin: 7.7 g/dL — ABNORMAL LOW (ref 12.0–15.0)
Immature Granulocytes: 1 %
Lymphocytes Relative: 28 %
Lymphs Abs: 0.6 10*3/uL — ABNORMAL LOW (ref 0.7–4.0)
MCH: 35.3 pg — ABNORMAL HIGH (ref 26.0–34.0)
MCHC: 32.4 g/dL (ref 30.0–36.0)
MCV: 109.2 fL — ABNORMAL HIGH (ref 80.0–100.0)
Monocytes Absolute: 0.2 10*3/uL (ref 0.1–1.0)
Monocytes Relative: 10 %
Neutro Abs: 1.3 10*3/uL — ABNORMAL LOW (ref 1.7–7.7)
Neutrophils Relative %: 58 %
Platelets: 109 10*3/uL — ABNORMAL LOW (ref 150–400)
RBC: 2.18 MIL/uL — ABNORMAL LOW (ref 3.87–5.11)
RDW: 18 % — ABNORMAL HIGH (ref 11.5–15.5)
WBC: 2.2 10*3/uL — ABNORMAL LOW (ref 4.0–10.5)
nRBC: 0 % (ref 0.0–0.2)

## 2018-10-29 NOTE — Progress Notes (Signed)
[place]  [phone] [phone]    ID: Darlene Grant DOB: 1976-05-17  MR#: [id number]  HCW#:[id number]  Patient Care Team: Nicholas Lose, MD as PCP - General (Hematology and Oncology) Rolm Bookbinder, MD as Consulting Physician (General Surgery) Magrinat, Virgie Dad, MD as Consulting Physician (Oncology) Gery Pray, MD as Consulting Physician (Radiation Oncology) Emily Filbert, MD as Consulting Physician (Obstetrics and Gynecology) Lavonna Monarch, MD as Consulting Physician (Dermatology) Mauro Kaufmann, RN as Oncology Nurse Navigator Rockwell Germany, RN as Oncology Nurse Navigator OTHER MD:   CHIEF COMPLAINT: Triple negative breast cancer  CURRENT TREATMENT: Neoadjuvant chemotherapy   HISTORY OF CURRENT ILLNESS:  Darlene Grant had routine screening mammography on 04/19/2018 showing a possible abnormality in the right breast. She underwent unilateral right diagnostic mammography with tomography and right breast ultrasonography at [facility] on 05/02/2018 showing: Breast Density Category C: There was an oval circumscribed mass in the posterior aspect of the upper outer right breast.  On exam this measured 3.5 cm, was firm, irregular and centered on 10-11 o'clock 5 cm from the nipple.  By ultrasonography there was a 3.7 x 1.7 x 1.5 cm elongated, irregular, hypoechoic mass in the 10:30 o'clock position of the right breast, 5 cm from the nipple.  Ultrasound of the right axilla showed 7 abnormal right axillary lymph nodes highly suspicious for metastatic nodes.  Accordingly on 05/03/2018 she proceeded to biopsy of the right breast area in question and one suspicious lymph node. The pathology from this procedure showed ([id number]): Right Breast: invasive mammary carcinoma, grade 3, E-cadherin positive (and therefore ductal) lymphovascular invasion present. Lymph node: Invasive mammary carcinoma Prognostic indicators  significant for: estrogen receptor, 0% negative and progesterone receptor, 0% negative. Proliferation marker Ki67 at 75%. HER2 negative (1+).  The patient's subsequent history is as detailed below.   INTERVAL HISTORY: Darlene Grant returns today for follow-up and treatment of her triple negative breast cancer.   She has recently completed neoadjuvant chemotherapy and f/u breast MRI was consistent with full radiographic response to chemotherapy.  This is great news.  She is here today for evaluation of intermittent tingling in her hands.  She notes that she has had a wrist injury in her right wrist and that her increase in activity certainly could have contributed to this sensation.  She also notes that she has not had any neuropathy or numbness and tingling in the pads of her fingertips and toes.  She is doing more activity with her hands to work on improving blood flow in this area.    Darlene Grant is also getting her labs checked as she is going to have surgery with Dr. Donne Hazel on 5/28.   REVIEW OF SYSTEMS: Darlene Grant has been anemic towards the end of her chemotherapy completion with a hemoglobin of 7.3 at its lowest.  It has remained 7.8-7.9.  Darlene Grant notes that her fatigue however is improving since completing chemotherapy.  She denies any fever or chills.  She hasn't had any unusual headaches, mucositis, vision concerns, dysphagia, nausea or vomiting.  She notes her bowel concerns have resolved and her bowels are back to normal.  She notes that she has not experienced cough, shortness of breath, chest pain, or palpitations.  A detailed ROS was otherwise non contributory.     PAST MEDICAL HISTORY: Past Medical History:  Diagnosis Date  . Abnormal Pap smear    ASCUS 03/13/11  . Anemia   . Breast pain, left   . Cancer (  HCC)   . Candida vaginitis   . Complication of anesthesia    pt states that all narcotics make her angry and she would prefer to avoid them.  . Rectal itching   . Vaginal Pap smear,  abnormal     PAST SURGICAL HISTORY: Past Surgical History:  Procedure Laterality Date  . BREAST BIOPSY Right 8/16  . CESAREAN SECTION N/A 11/21/2013   Procedure: CESAREAN SECTION;  Surgeon: Alwyn Pea, MD;  Location: [facility];  Service: Obstetrics;  Laterality: N/A;  . CESAREAN SECTION N/A 06/23/2016   Procedure: CESAREAN SECTION;  Surgeon: Emily Filbert, MD;  Location: [facility];  Service: Obstetrics;  Laterality: N/A;  . HAND SURGERY  04/2012   ligament repair  . PILONIDAL CYST EXCISION  2000  . PORTACATH PLACEMENT N/A 05/28/2018   Procedure: INSERTION PORT-A-CATH WITH ULTRASOUND;  Surgeon: Rolm Bookbinder, MD;  Location: [facility];  Service: General;  Laterality: N/A;  . WISDOM TOOTH EXTRACTION  AGE 9    FAMILY HISTORY: Family History  Problem Relation Age of Onset  . Cancer Mother 74       breast, premenopausal  . Breast cancer Mother   . Cancer Maternal Grandmother        skin, stomach  . Cancer Maternal Grandfather        lung   As of November 2019 her father is 35 years old with a history of Parkinson's. Patients' mother is 52 years old with a history of breast cancer, diagnosed age 5, treated with radical mastectomy. The patient has no brothers and two sisters. Besides the patient's mother, the patient has no other family medical history of breast or ovarian cancer. Her maternal grandmother had stomach cancer and her maternal grandfather had lung cancer, --he was a smoker.    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  Her husband, [name], an Chief Financial Officer. She has two children, [name], age 66, and [name], age 10 months.  The patient is not a smoker. She does drink alcohol and drinks  an estimated one to two drinks per week.     ADVANCED DIRECTIVES: [name], her husband, is her healthcare department   HEALTH MAINTENANCE: Social History   Tobacco Use  . Smoking status: Never Smoker  . Smokeless tobacco: Never Used  Substance Use Topics  . Alcohol use: Yes    Alcohol/week: 0.0 standard drinks    Comment: occassionally  . Drug use: No    Colonoscopy: no  PAP: 12/2017  Bone density: no   Allergies  Allergen Reactions  . Augmentin [Amoxicillin-Pot Clavulanate]     diarrhea  . Hydrocodone Itching  . Keflex [Cephalexin] Diarrhea    Current Outpatient Medications  Medication Sig Dispense Refill  . acyclovir cream (ZOVIRAX) 5 % Apply 1 application topically every 3 (three) hours. 15 g 2  . ciprofloxacin (CIPRO) 500 MG tablet Take one tablet by mouth 2 times daily for 5 days after each chemo cycle, starting on day 8 of chemo 40 tablet 1  . cyclobenzaprine (FLEXERIL) 10 MG tablet Take 0.5-1 tablets (5-10 mg total) by mouth 3 (three)  times daily as needed for muscle spasms. 30 tablet 1  . dexamethasone (DECADRON) 4 MG tablet Take 2 tablets by mouth once a day on the day after chemotherapy and then take 2 tablets two times a day for 2 days. Take with food. 30 tablet 1  . famotidine (PEPCID) 20 MG tablet Take 1 tablet (20 mg total) by mouth 2 (two) times daily.    . fluticasone (FLONASE) 50 MCG/ACT nasal spray Place 2 sprays into both nostrils daily. 16 g 2  . lidocaine (XYLOCAINE) 2 % solution Use as directed 5 mLs in the mouth or throat every 3 (three) hours as needed for mouth pain. 200 mL 1  . lidocaine-prilocaine (EMLA) cream Apply to affected area once 30 g 3  . loratadine (CLARITIN) 10 MG tablet Take 10 mg by mouth daily as needed for allergies.    Marland Kitchen LORazepam (ATIVAN) 0.5 MG tablet Take 1 tablet (0.5 mg total) by mouth at bedtime as needed (Nausea or vomiting). 30 tablet 0  . polyethylene glycol (MIRALAX) 0.34 gm/ml SOLN     . Probiotic Product (PROBIOTIC DAILY  PO) Take 1 tablet by mouth.    . prochlorperazine (COMPAZINE) 10 MG tablet Take 1 tablet (10 mg total) by mouth every 6 (six) hours as needed (Nausea or vomiting). 30 tablet 1  . sucralfate (CARAFATE) 1 g tablet Take 1 tablet (1 g total) by mouth 4 (four) times daily -  with meals and at bedtime. 40 tablet 0   No current facility-administered medications for this visit.      OBJECTIVE:  Vitals:   10/29/18 1453  BP: 111/68  Pulse: 89  Resp: 20  Temp: 98.5 F (36.9 C)  SpO2: 100%     Body mass index is 25.08 kg/m.   Wt Readings from Last 3 Encounters:  10/29/18 146 lb 1.6 oz (66.3 kg)  10/18/18 143 lb 4.8 oz (65 kg)  10/11/18 144 lb 11.2 oz (65.6 kg)  ECOG FS: 1 GENERAL: Patient is a well appearing female in no acute distress HEENT:  Sclerae anicteric.  Oropharynx clear and moist. No ulcerations or evidence of oropharyngeal candidiasis. Neck is supple.  NODES:  No cervical, supraclavicular, or axillary lymphadenopathy palpated.  BREAST EXAM:  Deferred. LUNGS:  Clear to auscultation bilaterally.  No wheezes or rhonchi. HEART:  Regular rate and rhythm. No murmur appreciated. ABDOMEN:  Soft, nontender.  Positive, normoactive bowel sounds. No organomegaly palpated. MSK:  No focal spinal tenderness to palpation. Full range of motion bilaterally in the upper extremities. EXTREMITIES:  No peripheral edema.   SKIN:  Clear with no obvious rashes or skin changes. No nail dyscrasia. NEURO:  Nonfocal. Well oriented.  Appropriate affect.    LAB RESULTS:  CMP     Component Value Date/Time   NA 140 10/29/2018 1435   K 4.4 10/29/2018 1435   CL 105 10/29/2018 1435   CO2 27 10/29/2018 1435   GLUCOSE 94 10/29/2018 1435   BUN 13 10/29/2018 1435   CREATININE 0.84 10/29/2018 1435   CREATININE 0.68 10/18/2018 1108   CREATININE 0.78 01/15/2018 1033   CALCIUM 9.6 10/29/2018 1435   PROT 7.1 10/29/2018 1435   ALBUMIN 4.3 10/29/2018 1435   AST 14 (L) 10/29/2018 1435   AST 16 10/18/2018 1108    ALT 14 10/29/2018 1435   ALT 18 10/18/2018 1108   ALKPHOS 68 10/29/2018 1435   BILITOT 0.9 10/29/2018 1435   BILITOT 0.7 10/18/2018 1108   GFRNONAA >60 10/29/2018 1435  GFRNONAA >60 10/18/2018 1108   GFRNONAA 95 01/15/2018 1033   GFRAA >60 10/29/2018 1435   GFRAA >60 10/18/2018 1108   GFRAA 110 01/15/2018 1033    No results found for: TOTALPROTELP, ALBUMINELP, A1GS, A2GS, BETS, BETA2SER, GAMS, MSPIKE, SPEI  No results found for: KPAFRELGTCHN, LAMBDASER, KAPLAMBRATIO  Lab Results  Component Value Date   WBC 2.2 (L) 10/29/2018   NEUTROABS 1.3 (L) 10/29/2018   HGB 7.7 (L) 10/29/2018   HCT 23.8 (L) 10/29/2018   MCV 109.2 (H) 10/29/2018   PLT 109 (L) 10/29/2018    '@LASTCHEMISTRY'$ @  No results found for: LABCA2  No components found for: HENIDP824  No results for input(s): INR in the last 168 hours.  No results found for: LABCA2  No results found for: MPN361  No results found for: WER154  No results found for: MGQ676  No results found for: CA2729  No components found for: HGQUANT  No results found for: CEA1 / No results found for: CEA1   No results found for: AFPTUMOR  No results found for: CHROMOGRNA  No results found for: PSA1  Appointment on 10/29/2018  Component Date Value Ref Range Status  . Iron 10/29/2018 119  41 - 142 ug/dL Final  . TIBC 10/29/2018 338  236 - 444 ug/dL Final  . Saturation Ratios 10/29/2018 35  21 - 57 % Final  . UIBC 10/29/2018 218  120 - 384 ug/dL Final   Performed at [facility], [place] [address], [place], [place] [address]  . Ferritin 10/29/2018 85  11 - 307 ng/mL Final   Performed at [facility], [place] [address], [place], [place] [address]  . Sodium 10/29/2018 140  135 - 145 mmol/L Final  . Potassium 10/29/2018 4.4  3.5 - 5.1 mmol/L Final  . Chloride 10/29/2018 105  98 - 111 mmol/L Final  . CO2 10/29/2018 27  22 - 32 mmol/L Final  . Glucose, Bld 10/29/2018 94  70 - 99 mg/dL  Final  . BUN 10/29/2018 13  6 - 20 mg/dL Final  . Creatinine, Ser 10/29/2018 0.84  0.44 - 1.00 mg/dL Final  . Calcium 10/29/2018 9.6  8.9 - 10.3 mg/dL Final  . Total Protein 10/29/2018 7.1  6.5 - 8.1 g/dL Final  . Albumin 10/29/2018 4.3  3.5 - 5.0 g/dL Final  . AST 10/29/2018 14* 15 - 41 U/L Final  . ALT 10/29/2018 14  0 - 44 U/L Final  . Alkaline Phosphatase 10/29/2018 68  38 - 126 U/L Final  . Total Bilirubin 10/29/2018 0.9  0.3 - 1.2 mg/dL Final  . GFR calc non Af Amer 10/29/2018 >60  >60 mL/min Final  . GFR calc Af Amer 10/29/2018 >60  >60 mL/min Final  . Anion gap 10/29/2018 8  5 - 15 Final   Performed at [facility], [place] [address] [place], [place] [address]  . WBC 10/29/2018 2.2* 4.0 - 10.5 K/uL Final  . RBC 10/29/2018 2.18* 3.87 - 5.11 MIL/uL Final  . Hemoglobin 10/29/2018 7.7* 12.0 - 15.0 g/dL Final  . HCT 10/29/2018 23.8* 36.0 - 46.0 % Final  . MCV 10/29/2018 109.2* 80.0 - 100.0 fL Final  . MCH 10/29/2018 35.3* 26.0 - 34.0 pg Final  . MCHC 10/29/2018 32.4  30.0 - 36.0 g/dL Final  . RDW 10/29/2018 18.0* 11.5 - 15.5 % Final  . Platelets 10/29/2018 109* 150 - 400 K/uL Final  . nRBC 10/29/2018 0.0  0.0 - 0.2 %  Final  . Neutrophils Relative % 10/29/2018 58  % Final  . Neutro Abs 10/29/2018 1.3* 1.7 - 7.7 K/uL Final  . Lymphocytes Relative 10/29/2018 28  % Final  . Lymphs Abs 10/29/2018 0.6* 0.7 - 4.0 K/uL Final  . Monocytes Relative 10/29/2018 10  % Final  . Monocytes Absolute 10/29/2018 0.2  0.1 - 1.0 K/uL Final  . Eosinophils Relative 10/29/2018 2  % Final  . Eosinophils Absolute 10/29/2018 0.0  0.0 - 0.5 K/uL Final  . Basophils Relative 10/29/2018 1  % Final  . Basophils Absolute 10/29/2018 0.0  0.0 - 0.1 K/uL Final  . Immature Granulocytes 10/29/2018 1  % Final  . Abs Immature Granulocytes 10/29/2018 0.01  0.00 - 0.07 K/uL Final   Performed at [facility], [place] [address] [place], [place] [address]    (this displays  the last labs from the last 3 days)  No results found for: TOTALPROTELP, ALBUMINELP, A1GS, A2GS, BETS, BETA2SER, GAMS, MSPIKE, SPEI (this displays SPEP labs)  No results found for: KPAFRELGTCHN, LAMBDASER, KAPLAMBRATIO (kappa/lambda light chains)  No results found for: HGBA, HGBA2QUANT, HGBFQUANT, HGBSQUAN (Hemoglobinopathy evaluation)   No results found for: LDH  Lab Results  Component Value Date   IRON 119 10/29/2018   TIBC 338 10/29/2018   IRONPCTSAT 35 10/29/2018   (Iron and TIBC)  Lab Results  Component Value Date   FERRITIN 85 10/29/2018    Urinalysis    Component Value Date/Time   COLORURINE YELLOW 12/18/2016 [place] 12/18/2016 1607   LABSPEC 1.010 12/18/2016 1607   PHURINE 7.0 12/18/2016 [place] 12/18/2016 1607   HGBUR NEGATIVE 12/18/2016 1607   HGBUR negative 01/13/2010 0940   BILIRUBINUR NEGATIVE 12/18/2016 1607   BILIRUBINUR neg 05/17/2015 [place] 12/18/2016 1607   PROTEINUR NEGATIVE 12/18/2016 1607   UROBILINOGEN 0.2 05/17/2015 1034   UROBILINOGEN 0.2 01/13/2010 0940   NITRITE NEGATIVE 12/18/2016 1607   LEUKOCYTESUR NEGATIVE 12/18/2016 1607    STUDIES:  Preop MRI to be scheduled  ELIGIBLE FOR AVAILABLE RESEARCH PROTOCOL: S 1418   ASSESSMENT: 43 y.o. [place] woman, status post right breast upper outer quadrant and right axillary lymph node biopsy 05/03/2018 for a clinical T2 N2, stage IIIC invasive ductal carcinoma, triple negative, with an MIB-1 of 75%  (a) staging studies with CT scans of the chest abdomen and pelvis on 05/29/18 show no distant metastases; bone scan shows questionable focus of tracer accumulation on the proximal to mid right femoral diaphysis  (b) MRI breast on 05/31/18 shows additional non mass like enhancement throughout upper outer right breast that extends 5cm inferomedially from biopsy proven malignancy.    (1) genetics testing reported 06/03/2018 at  [facility] through the in [place] breast cancer guidelines based panel (11 genes analyzed) found no deleterious mutations and no variants of uncertain significance in ATM, BRCA1, BRCA2, CDH 1, CH EK 2, NBN, NF1, PALB 2, PT EN, STK 11, TP 53 06/06/2018  (2) neoadjuvant chemotherapy consisting of doxorubicin and cyclophosphamide in dose dense fashion x4 started 06/03/2018, completed 07/15/2018, followed by paclitaxel and carboplatin weekly x12 starting 08/02/2018 (a) Echo on 05/23/2018 shows an EF of 60-65%.  (3) definitive surgery to follow (patient is planning on bilateral mastectomies with no reconstruction)  (4) adjuvant radiation to follow surgery  (5) may be a candidate for S 1418  (6) consider adjuvant capecitabine depending on final surgical results   PLAN: Darlene Grant is doing well today.  She is feeling better since completing chemotherapy and had a tremendous response to her neoadjuvant chemotherapy.  She has bilateral mastectomies scheduled with Dr. Donne Hazel on 11/14/2018.  He is aware of her hemoglobin of 7.9 and plans to check CBC prior to her surgery and transfuse her that day if needed.    Hilary and I reviewed her intermittent numbness in her hands.  This doesn't sound like classical peripheral neuropathy from the chemotherapy, but atypical neuropathy can happen following chemotherapy completion.  It also could be coming from her wrists, particularly since she has had wrist issues in the past.  I recommended she wear braces on her wrists.  She will continue with her activities and exercises, and I recommended she take b complex vitamins.  We briefly discussed Gabapentin as pharmacological therapy, however she is not to the point of needing this and will try the above first.   We reviewed her labs in detail and I sent a copy of them to Dr. Donne Hazel.  Darlene Grant will f/u with Dr. Lindi Adie after surgery.  She knows to call for any questions or concerns prior to her next appointment with [place].    A total  of (30) minutes of face-to-face time was spent with this patient with greater than 50% of that time in counseling and care-coordination.   Wilber Bihari, NP  11/04/18 11:43 AM Medical Oncology and Hematology [facility] [address] [place], [place] [address] Tel. [phone]    Fax. [phone]

## 2018-10-30 ENCOUNTER — Telehealth: Payer: Self-pay

## 2018-10-30 LAB — IRON AND TIBC
Iron: 119 ug/dL (ref 41–142)
Saturation Ratios: 35 % (ref 21–57)
TIBC: 338 ug/dL (ref 236–444)
UIBC: 218 ug/dL (ref 120–384)

## 2018-10-30 LAB — FERRITIN: Ferritin: 85 ng/mL (ref 11–307)

## 2018-10-30 NOTE — Telephone Encounter (Signed)
-----   Message from Gardenia Phlegm, NP sent at 10/30/2018  8:55 AM EDT ----- Please give patient iron levels, which are improving ----- Message ----- From: Interface, Lab In [place] Sent: 10/30/2018   8:52 AM EDT To: Gardenia Phlegm, NP

## 2018-10-30 NOTE — Telephone Encounter (Signed)
I already did.  Thanks, Mendel Ryder

## 2018-10-30 NOTE — Telephone Encounter (Signed)
LVM asking patient to call back for iron results.

## 2018-10-30 NOTE — Telephone Encounter (Signed)
Patient returned call to nurse.  Iron and ferritin levels were given to patient.  She voiced understanding.  Patient also asked if NP will share her lab results with Dr. Donne Hazel.  This nurse will pass information to NP for follow up.

## 2018-10-31 ENCOUNTER — Encounter: Payer: Self-pay | Admitting: *Deleted

## 2018-11-01 ENCOUNTER — Telehealth: Payer: Self-pay | Admitting: *Deleted

## 2018-11-01 ENCOUNTER — Other Ambulatory Visit: Payer: BLUE CROSS/BLUE SHIELD

## 2018-11-01 ENCOUNTER — Ambulatory Visit: Payer: BLUE CROSS/BLUE SHIELD | Admitting: Oncology

## 2018-11-01 NOTE — Telephone Encounter (Signed)
MR faxed to [place], RN Case Manager; [phone]

## 2018-11-04 ENCOUNTER — Encounter: Payer: Self-pay | Admitting: Adult Health

## 2018-11-06 ENCOUNTER — Encounter (HOSPITAL_BASED_OUTPATIENT_CLINIC_OR_DEPARTMENT_OTHER): Payer: Self-pay | Admitting: *Deleted

## 2018-11-06 ENCOUNTER — Other Ambulatory Visit: Payer: Self-pay

## 2018-11-07 ENCOUNTER — Encounter: Payer: Self-pay | Admitting: Physical Therapy

## 2018-11-07 ENCOUNTER — Ambulatory Visit: Payer: BLUE CROSS/BLUE SHIELD | Attending: General Surgery | Admitting: Physical Therapy

## 2018-11-07 DIAGNOSIS — R293 Abnormal posture: Secondary | ICD-10-CM | POA: Insufficient documentation

## 2018-11-07 DIAGNOSIS — M6281 Muscle weakness (generalized): Secondary | ICD-10-CM | POA: Diagnosis not present

## 2018-11-07 NOTE — Patient Instructions (Addendum)
Www.[email or web address].com Courses Online Strength After Breast Cancer Look at the right of the page for Lymphedema Education Session   SHOULDER: Flexion - Supine (Cane)        Cancer Rehab [phone]    Hold cane in both hands. Raise arms up overhead. Do not allow back to arch. Hold _5__ seconds. Do __5-10__ times; __1-2__ times a day.   SELF ASSISTED WITH OBJECT: Shoulder Abduction / Adduction - Supine    Hold cane with both hands. Move both arms from side to side, keep elbows straight.  Hold when stretch felt for __5__ seconds. Repeat __5-10__ times; __1-2__ times a day. Once this becomes easier progress to third picture bringing affected arm towards ear by staying out to side. Same hold for _5_seconds. Repeat  _5-10_ times, _1-2_ times/day.  Shoulder Blade Stretch    Clasp fingers behind head with elbows touching in front of face. Pull elbows back while pressing shoulder blades together. Relax and hold as tolerated, can place pillow under elbow here for comfort as needed and to allow for prolonged stretch.  Repeat __5__ times. Do __1-2__ sessions per day.     SHOULDER: External Rotation - Supine (Cane)    Hold cane with both hands. Rotate arm away from body. Keep elbow on floor and next to body. _5-10__ reps per set, hold 5 seconds, _1-2__ sets per day. Add towel to keep elbow at side.  Copyright  VHI. All rights reserved.      First of all, check with your insurance company to see if provider is in [place] (for wigs and compression sleeves / gloves/gauntlets )  [place], [place] [address] [phone]  Will file some insurances --- call for appointment   Second to [facility] (for mastectomy prosthetics and garments) [place], [place] [address] [phone] Will file some insurances --- call for appointment  [facility]  [address]  [place], [place] [address] [phone] Lower extremity garments   [facility] [place], [place]  [address] [place] ( Medicaid certified lymphedema fitter) [phone] [email or web address]  [place] [place],  [address] [phone]  Other Resources: National Lymphedema Network:  www.lymphnet.org www.Klosetraining.com for patient articles and self manual lymph drainage information www.[email or web address] has informative articles.  [email or web address]

## 2018-11-07 NOTE — Therapy (Signed)
[place], [place], [address] Phone: [phone]   Fax:  [phone]  Physical Therapy Evaluation  Patient Details  Name: Darlene Grant MRN: [id number] Date of Birth: 1975/07/03 Referring Provider (PT): [place]    Encounter Date: 11/07/2018  PT End of Session - 11/07/18 1713    Visit Number  1    Number of Visits  1    PT Start Time  1500    PT Stop Time  1600    PT Time Calculation (min)  60 min    Activity Tolerance  Patient tolerated treatment well    Behavior During Therapy  [facility] for tasks assessed/performed       Past Medical History:  Diagnosis Date  . Abnormal Pap smear    ASCUS 03/13/11  . Anemia   . Breast pain, left   . Cancer ([place])   . Candida vaginitis   . Complication of anesthesia    pt states that all narcotics make her angry and she would prefer to avoid them.  . Rectal itching   . Vaginal Pap smear, abnormal     Past Surgical History:  Procedure Laterality Date  . BREAST BIOPSY Right 8/16  . CESAREAN SECTION N/A 11/21/2013   Procedure: CESAREAN SECTION;  Surgeon: Alwyn Pea, MD;  Location: [facility];  Service: Obstetrics;  Laterality: N/A;  . CESAREAN SECTION N/A 06/23/2016   Procedure: CESAREAN SECTION;  Surgeon: Emily Filbert, MD;  Location: [facility];  Service: Obstetrics;  Laterality: N/A;  . HAND SURGERY  04/2012   ligament repair  . PILONIDAL CYST EXCISION  2000  . PORTACATH PLACEMENT N/A 05/28/2018   Procedure: INSERTION PORT-A-CATH WITH ULTRASOUND;  Surgeon: Rolm Bookbinder, MD;  Location: [facility];  Service: General;  Laterality: N/A;  . WISDOM TOOTH EXTRACTION  AGE 10    There were no vitals filed for this visit.   Subjective Assessment - 11/07/18 1541    Subjective  pt is having surgery next week     Pertinent History  breast cancer diagnosed November 2019: triple negative with Ki of 75% She completed chemotherapy  10/18/2018 and is having bilateral mastectomy with right axillary node dissection next week     Patient Stated Goals  to learn what she needs to know for exercises post op and to hopefully avoid lymphedema     Currently in Pain?  No/denies         [facility] PT Assessment - 11/07/18 0001      Assessment   Medical Diagnosis  right breast cancer     Referring Provider (PT)  [place]     Onset Date/Surgical Date  04/19/18    Hand Dominance  Right      Precautions   Precautions  Other (comment)    Precaution Comments  recent chemo      Restrictions   Weight Bearing Restrictions  No      Balance Screen   Has the patient fallen in the past 6 months  No    Has the patient had a decrease in activity level because of a fear of falling?   No    Is the patient reluctant to leave their home because of a fear of falling?   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     Leisure  likes to walk and exercise regularly, well educated in exercise    pt reports she has been exercsing throughout chemo     Cognition   Overall Cognitive Status  Within Functional Limits for tasks assessed      Observation/Other Assessments   Observations  young healthy looking female      Coordination   Gross Motor Movements are Fluid and Coordinated  Yes      Posture/Postural Control   Posture/Postural Control  Postural limitations    Postural Limitations  Increased lumbar lordosis;Decreased thoracic kyphosis      ROM / Strength   AROM / PROM / Strength  AROM;Strength      AROM   AROM Assessment Site  Shoulder    Right/Left Shoulder  Right;Left    Right Shoulder Flexion  170 Degrees    Right Shoulder ABduction  180  Degrees    Right Shoulder External Rotation  90 Degrees    Left Shoulder Flexion  160 Degrees    Left Shoulder ABduction  180 Degrees    Left Shoulder External Rotation  90 Degrees      Strength   Overall Strength  Within functional limits for tasks performed    Overall Strength Comments  pt feels generally weak from chemotherapy        LYMPHEDEMA/ONCOLOGY QUESTIONNAIRE - 11/07/18 1548      Right Upper Extremity Lymphedema   10 cm Proximal to Olecranon Process  26 cm    Olecranon Process  23.5 cm    15 cm Proximal to Ulnar Styloid Process  23 cm    Just Proximal to Ulnar Styloid Process  15.5 cm    Across Hand at [employer]  19 cm    At [place] of 2nd Digit  6 cm      Left Upper Extremity Lymphedema   10 cm Proximal to Olecranon Process  26 cm    Olecranon Process  23.5 cm    15 cm Proximal to Ulnar Styloid Process  23 cm    Just Proximal to Ulnar Styloid Process  15.5 cm    Across Hand at [employer]  19 cm    At [place] of 2nd Digit  5.9 cm             Objective measurements completed on examination: See above findings.      [place] [facility] PT Treatment/Exercise - 11/07/18 0001      Self-Care   Self-Care  Other Self-Care Comments    Other Self-Care Comments   talked to pt about possible axillary cording and gave her a piece of tg soft to wear with a deep fold should whe have some symptoms in upper arm ,              PT Education - 11/07/18 1707    Education Details  post op exercises , introduction to strength ABC program and ABC class, where to get compression bra if needed, what axillary cording may present like     Person(s) Educated  Patient    Methods  Explanation;Demonstration;Handout    Comprehension  Verbalized understanding           Breast Clinic Goals - 11/07/18 1718  Patient will be able to verbalize understanding of pertinent lymphedema risk reduction practices relevant to her diagnosis specifically related to skin care.   Time   1    Period  Days    Status  Achieved      Patient will be able to return demonstrate and/or verbalize understanding of the post-op home exercise program related to regaining shoulder range of motion.   Time  1    Period  Days    Status  Achieved      Patient will be able to verbalize understanding of the importance of attending the postoperative After Breast Cancer Class for further lymphedema risk reduction education and therapeutic exercise.   Time  1    Period  Days    Status  Achieved            Plan - 11/07/18 1713    Clinical Impression Statement  43 year old female with triple negative breast cancer with good response from chemo is preparing for bilateral mastectomy with ALND next week.  She is a well educted OT and is concerned about lymphedema  Talked with pt alot about post op exercises and ways that exercise be progressed after radiation treatment to lower lymphedema risk.  Reassured pt that she could come back to PT anytime after [place] with doctor order to more treatment /education if she feels she needs it.     Personal Factors and Comorbidities  Age;Comorbidity 1    Comorbidities  recent chemo    Stability/Clinical Decision Making  Stable/Uncomplicated    Clinical Decision Making  Low    Rehab Potential  Good    PT Frequency  One time visit    PT Treatment/Interventions  ADLs/Self Care Home Management;Therapeutic exercise    PT Next Visit Plan  Reassess and develop treatment plan as indicated     Consulted and Agree with Plan of Care  Patient       Patient will benefit from skilled therapeutic intervention in order to improve the following deficits and impairments:  Decreased knowledge of precautions, Decreased knowledge of use of DME, Postural dysfunction  Visit Diagnosis: Abnormal posture - Plan: PT plan of care cert/re-cert  Muscle weakness (generalized) - Plan: PT plan of care cert/re-cert     Problem List Patient Active Problem List   Diagnosis  Date Noted  . Chemotherapy induced neutropenia ([place]) 09/13/2018  . Port-A-Cath in place 06/10/2018  . Malignant neoplasm of upper-outer quadrant of right breast in female, estrogen receptor negative ([place]) 05/09/2018  . Low iron stores 01/16/2018  . Muscle spasm 01/15/2018  . Piriformis syndrome of left side 01/15/2018  . Grade I hemorrhoids 02/23/2017  . Low lying posterior placenta, antepartum 02/01/2016  . AMA (advanced maternal age) multigravida 35+ 11/22/2015  . 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 Calcification of right breast 02/19/2015  . Ligament tear 07/05/2012   Donato Heinz. Owens Shark PT  Norwood Levo 11/07/2018, 5:21 PM  [place], [place], [address] Phone: [phone]   Fax:  [phone]  Name: Darlene Grant MRN: [id number] Date of Birth: [birth year]

## 2018-11-08 ENCOUNTER — Telehealth: Payer: Self-pay | Admitting: Hematology and Oncology

## 2018-11-08 NOTE — Telephone Encounter (Signed)
Scheduled port flush for 6/15 per sch msg. Called and spoke with patient. Confirmed date and time

## 2018-11-09 ENCOUNTER — Encounter: Payer: Self-pay | Admitting: Adult Health

## 2018-11-12 ENCOUNTER — Other Ambulatory Visit (HOSPITAL_COMMUNITY)
Admission: RE | Admit: 2018-11-12 | Discharge: 2018-11-12 | Disposition: A | Discharge status: Home / Self Care | Payer: BLUE CROSS/BLUE SHIELD | Source: Ambulatory Visit | Attending: General Surgery | Admitting: General Surgery

## 2018-11-12 DIAGNOSIS — Z1159 Encounter for screening for other viral diseases: Secondary | ICD-10-CM | POA: Diagnosis not present

## 2018-11-12 LAB — SARS CORONAVIRUS 2 BY RT PCR (HOSPITAL ORDER, PERFORMED IN ~~LOC~~ HOSPITAL LAB): SARS Coronavirus 2: NEGATIVE

## 2018-11-13 ENCOUNTER — Ambulatory Visit (HOSPITAL_COMMUNITY)
Admission: RE | Admit: 2018-11-13 | Discharge: 2018-11-13 | Disposition: A | Discharge status: Home / Self Care | Payer: BLUE CROSS/BLUE SHIELD | Source: Ambulatory Visit | Attending: Adult Health | Admitting: Adult Health

## 2018-11-13 ENCOUNTER — Other Ambulatory Visit: Payer: Self-pay | Admitting: Adult Health

## 2018-11-13 ENCOUNTER — Other Ambulatory Visit: Payer: Self-pay

## 2018-11-13 DIAGNOSIS — R0781 Pleurodynia: Secondary | ICD-10-CM

## 2018-11-13 DIAGNOSIS — Z171 Estrogen receptor negative status [ER-]: Secondary | ICD-10-CM

## 2018-11-13 DIAGNOSIS — C50411 Malignant neoplasm of upper-outer quadrant of right female breast: Secondary | ICD-10-CM | POA: Insufficient documentation

## 2018-11-13 NOTE — Progress Notes (Signed)
Xray ordered based on patient symptoms of lower rib pain x 7-8 weeks that isn't improving.

## 2018-11-14 ENCOUNTER — Other Ambulatory Visit: Payer: Self-pay

## 2018-11-14 ENCOUNTER — Encounter (HOSPITAL_BASED_OUTPATIENT_CLINIC_OR_DEPARTMENT_OTHER)
Admission: RE | Disposition: A | Discharge status: Home / Self Care | Payer: Self-pay | Source: Home / Self Care | Attending: General Surgery

## 2018-11-14 ENCOUNTER — Ambulatory Visit (HOSPITAL_BASED_OUTPATIENT_CLINIC_OR_DEPARTMENT_OTHER)
Admission: RE | Admit: 2018-11-14 | Discharge: 2018-11-14 | Disposition: A | Discharge status: Home / Self Care | Payer: BLUE CROSS/BLUE SHIELD | Attending: General Surgery | Admitting: General Surgery

## 2018-11-14 ENCOUNTER — Ambulatory Visit (HOSPITAL_BASED_OUTPATIENT_CLINIC_OR_DEPARTMENT_OTHER): Payer: BLUE CROSS/BLUE SHIELD | Admitting: Certified Registered"

## 2018-11-14 ENCOUNTER — Encounter (HOSPITAL_BASED_OUTPATIENT_CLINIC_OR_DEPARTMENT_OTHER): Payer: Self-pay | Admitting: Emergency Medicine

## 2018-11-14 DIAGNOSIS — N6012 Diffuse cystic mastopathy of left breast: Secondary | ICD-10-CM | POA: Insufficient documentation

## 2018-11-14 DIAGNOSIS — G8918 Other acute postprocedural pain: Secondary | ICD-10-CM | POA: Diagnosis not present

## 2018-11-14 DIAGNOSIS — Z803 Family history of malignant neoplasm of breast: Secondary | ICD-10-CM | POA: Insufficient documentation

## 2018-11-14 DIAGNOSIS — Z881 Allergy status to other antibiotic agents status: Secondary | ICD-10-CM | POA: Insufficient documentation

## 2018-11-14 DIAGNOSIS — Z885 Allergy status to narcotic agent status: Secondary | ICD-10-CM | POA: Diagnosis not present

## 2018-11-14 DIAGNOSIS — Z7951 Long term (current) use of inhaled steroids: Secondary | ICD-10-CM | POA: Diagnosis not present

## 2018-11-14 DIAGNOSIS — Z171 Estrogen receptor negative status [ER-]: Secondary | ICD-10-CM | POA: Insufficient documentation

## 2018-11-14 DIAGNOSIS — Z79899 Other long term (current) drug therapy: Secondary | ICD-10-CM | POA: Insufficient documentation

## 2018-11-14 DIAGNOSIS — Z880 Allergy status to penicillin: Secondary | ICD-10-CM | POA: Diagnosis not present

## 2018-11-14 DIAGNOSIS — C50411 Malignant neoplasm of upper-outer quadrant of right female breast: Secondary | ICD-10-CM | POA: Insufficient documentation

## 2018-11-14 DIAGNOSIS — R921 Mammographic calcification found on diagnostic imaging of breast: Secondary | ICD-10-CM | POA: Diagnosis not present

## 2018-11-14 DIAGNOSIS — Z853 Personal history of malignant neoplasm of breast: Secondary | ICD-10-CM

## 2018-11-14 HISTORY — PX: MASTECTOMY WITH AXILLARY LYMPH NODE DISSECTION: SHX5661

## 2018-11-14 LAB — CBC
HCT: 25.8 % — ABNORMAL LOW (ref 36.0–46.0)
Hemoglobin: 8.8 g/dL — ABNORMAL LOW (ref 12.0–15.0)
MCH: 36.8 pg — ABNORMAL HIGH (ref 26.0–34.0)
MCHC: 34.1 g/dL (ref 30.0–36.0)
MCV: 107.9 fL — ABNORMAL HIGH (ref 80.0–100.0)
Platelets: 93 10*3/uL — ABNORMAL LOW (ref 150–400)
RBC: 2.39 MIL/uL — ABNORMAL LOW (ref 3.87–5.11)
RDW: 16.3 % — ABNORMAL HIGH (ref 11.5–15.5)
WBC: 2 10*3/uL — ABNORMAL LOW (ref 4.0–10.5)
nRBC: 0 % (ref 0.0–0.2)

## 2018-11-14 LAB — TYPE AND SCREEN
ABO/RH(D): A POS
Antibody Screen: NEGATIVE

## 2018-11-14 LAB — POCT PREGNANCY, URINE: Preg Test, Ur: NEGATIVE

## 2018-11-14 LAB — ABO/RH: ABO/RH(D): A POS

## 2018-11-14 SURGERY — MASTECTOMY WITH AXILLARY LYMPH NODE DISSECTION
Anesthesia: General | Site: Breast | Laterality: Bilateral

## 2018-11-14 MED ORDER — FENTANYL CITRATE (PF) 100 MCG/2ML IJ SOLN
INTRAMUSCULAR | Status: AC
  Filled 2018-11-14: qty 2

## 2018-11-14 MED ORDER — DEXAMETHASONE SODIUM PHOSPHATE 4 MG/ML IJ SOLN
INTRAMUSCULAR | Status: DC | PRN
  Administered 2018-11-14: 10 mg via INTRAVENOUS

## 2018-11-14 MED ORDER — KETOROLAC TROMETHAMINE 15 MG/ML IJ SOLN
15.0000 mg | INTRAMUSCULAR | Status: DC
  Administered 2018-11-14: 15 mg via INTRAVENOUS

## 2018-11-14 MED ORDER — MIDAZOLAM HCL 2 MG/2ML IJ SOLN
1.0000 mg | INTRAMUSCULAR | Status: DC | PRN
  Administered 2018-11-14 (×2): 2 mg via INTRAVENOUS

## 2018-11-14 MED ORDER — ONDANSETRON HCL 4 MG/2ML IJ SOLN
INTRAMUSCULAR | Status: AC
  Filled 2018-11-14: qty 2

## 2018-11-14 MED ORDER — GABAPENTIN 100 MG PO CAPS
ORAL_CAPSULE | ORAL | Status: AC
  Filled 2018-11-14: qty 1

## 2018-11-14 MED ORDER — LIDOCAINE 2% (20 MG/ML) 5 ML SYRINGE
INTRAMUSCULAR | Status: DC | PRN
  Administered 2018-11-14: 60 mg via INTRAVENOUS

## 2018-11-14 MED ORDER — LIDOCAINE 2% (20 MG/ML) 5 ML SYRINGE
INTRAMUSCULAR | Status: AC
  Filled 2018-11-14: qty 5

## 2018-11-14 MED ORDER — BUPIVACAINE HCL (PF) 0.25 % IJ SOLN
INTRAMUSCULAR | Status: AC
  Filled 2018-11-14: qty 30

## 2018-11-14 MED ORDER — MIDAZOLAM HCL 2 MG/2ML IJ SOLN
INTRAMUSCULAR | Status: AC
  Filled 2018-11-14: qty 2

## 2018-11-14 MED ORDER — METHOCARBAMOL 750 MG PO TABS: 750.0000 mg | ORAL_TABLET | Freq: Three times a day (TID) | ORAL | 2 refills | Status: DC | PRN

## 2018-11-14 MED ORDER — ACETAMINOPHEN 325 MG PO TABS
650.0000 mg | ORAL_TABLET | Freq: Four times a day (QID) | ORAL | Status: DC | PRN
  Administered 2018-11-14: 650 mg via ORAL
  Filled 2018-11-14: qty 2

## 2018-11-14 MED ORDER — CHLORHEXIDINE GLUCONATE CLOTH 2 % EX PADS: 6.0000 | MEDICATED_PAD | Freq: Once | CUTANEOUS | Status: DC

## 2018-11-14 MED ORDER — BUPIVACAINE LIPOSOME 1.3 % IJ SUSP
INTRAMUSCULAR | Status: DC | PRN
  Administered 2018-11-14: 10 mL via PERINEURAL

## 2018-11-14 MED ORDER — EPHEDRINE 5 MG/ML INJ
INTRAVENOUS | Status: AC
  Filled 2018-11-14: qty 10

## 2018-11-14 MED ORDER — PHENYLEPHRINE 40 MCG/ML (10ML) SYRINGE FOR IV PUSH (FOR BLOOD PRESSURE SUPPORT)
PREFILLED_SYRINGE | INTRAVENOUS | Status: AC
  Filled 2018-11-14: qty 10

## 2018-11-14 MED ORDER — ACETAMINOPHEN 500 MG PO TABS
1000.0000 mg | ORAL_TABLET | ORAL | Status: AC
  Administered 2018-11-14: 1000 mg via ORAL

## 2018-11-14 MED ORDER — TRAMADOL HCL 50 MG PO TABS: 100.0000 mg | ORAL_TABLET | Freq: Four times a day (QID) | ORAL | 0 refills | Status: DC | PRN

## 2018-11-14 MED ORDER — ACETAMINOPHEN 500 MG PO TABS
ORAL_TABLET | ORAL | Status: AC
  Filled 2018-11-14: qty 2

## 2018-11-14 MED ORDER — MEPERIDINE HCL 25 MG/ML IJ SOLN: 6.2500 mg | INTRAMUSCULAR | Status: DC | PRN

## 2018-11-14 MED ORDER — FENTANYL CITRATE (PF) 100 MCG/2ML IJ SOLN
50.0000 ug | INTRAMUSCULAR | Status: AC | PRN
  Administered 2018-11-14: 50 ug via INTRAVENOUS
  Administered 2018-11-14: 100 ug via INTRAVENOUS
  Administered 2018-11-14: 12:00:00 25 ug via INTRAVENOUS
  Administered 2018-11-14: 10:00:00 50 ug via INTRAVENOUS
  Administered 2018-11-14: 25 ug via INTRAVENOUS
  Administered 2018-11-14: 09:00:00 50 ug via INTRAVENOUS

## 2018-11-14 MED ORDER — ROCURONIUM BROMIDE 10 MG/ML (PF) SYRINGE
PREFILLED_SYRINGE | INTRAVENOUS | Status: AC
  Filled 2018-11-14: qty 10

## 2018-11-14 MED ORDER — CIPROFLOXACIN IN D5W 400 MG/200ML IV SOLN
400.0000 mg | INTRAVENOUS | Status: AC
  Administered 2018-11-14: 09:00:00 400 mg via INTRAVENOUS

## 2018-11-14 MED ORDER — DEXAMETHASONE SODIUM PHOSPHATE 10 MG/ML IJ SOLN
INTRAMUSCULAR | Status: AC
  Filled 2018-11-14: qty 1

## 2018-11-14 MED ORDER — CIPROFLOXACIN IN D5W 400 MG/200ML IV SOLN
INTRAVENOUS | Status: AC
  Filled 2018-11-14: qty 200

## 2018-11-14 MED ORDER — SCOPOLAMINE 1 MG/3DAYS TD PT72: 1.0000 | MEDICATED_PATCH | Freq: Once | TRANSDERMAL | Status: DC | PRN

## 2018-11-14 MED ORDER — LACTATED RINGERS IV SOLN
INTRAVENOUS | Status: DC
  Administered 2018-11-14 (×3): via INTRAVENOUS

## 2018-11-14 MED ORDER — BUPIVACAINE HCL (PF) 0.5 % IJ SOLN
INTRAMUSCULAR | Status: DC | PRN
  Administered 2018-11-14: 20 mL via PERINEURAL

## 2018-11-14 MED ORDER — HYDROMORPHONE HCL 1 MG/ML IJ SOLN: 0.2500 mg | INTRAMUSCULAR | Status: DC | PRN

## 2018-11-14 MED ORDER — KETOROLAC TROMETHAMINE 15 MG/ML IJ SOLN
INTRAMUSCULAR | Status: AC
  Filled 2018-11-14: qty 1

## 2018-11-14 MED ORDER — ENSURE PRE-SURGERY PO LIQD: 296.0000 mL | Freq: Once | ORAL | Status: DC

## 2018-11-14 MED ORDER — GABAPENTIN 100 MG PO CAPS
100.0000 mg | ORAL_CAPSULE | ORAL | Status: AC
  Administered 2018-11-14: 100 mg via ORAL

## 2018-11-14 MED ORDER — ONDANSETRON HCL 4 MG/2ML IJ SOLN
INTRAMUSCULAR | Status: DC | PRN
  Administered 2018-11-14: 4 mg via INTRAVENOUS

## 2018-11-14 MED ORDER — PROPOFOL 10 MG/ML IV BOLUS
INTRAVENOUS | Status: DC | PRN
  Administered 2018-11-14: 150 mg via INTRAVENOUS

## 2018-11-14 MED ORDER — OXYCODONE HCL 5 MG/5ML PO SOLN: 5.0000 mg | Freq: Once | ORAL | Status: DC | PRN

## 2018-11-14 MED ORDER — OXYCODONE HCL 5 MG PO TABS: 5.0000 mg | ORAL_TABLET | Freq: Once | ORAL | Status: DC | PRN

## 2018-11-14 MED ORDER — PROMETHAZINE HCL 25 MG/ML IJ SOLN: 6.2500 mg | INTRAMUSCULAR | Status: DC | PRN

## 2018-11-14 SURGICAL SUPPLY — 73 items
APPLIER CLIP 11 MED OPEN (CLIP)
APPLIER CLIP 9.375 MED OPEN (MISCELLANEOUS) ×6
BENZOIN TINCTURE PRP APPL 2/3 (GAUZE/BANDAGES/DRESSINGS) IMPLANT
BINDER BREAST LRG (GAUZE/BANDAGES/DRESSINGS) ×2 IMPLANT
BINDER BREAST MEDIUM (GAUZE/BANDAGES/DRESSINGS) IMPLANT
BINDER BREAST XLRG (GAUZE/BANDAGES/DRESSINGS) IMPLANT
BINDER BREAST XXLRG (GAUZE/BANDAGES/DRESSINGS) IMPLANT
BIOPATCH RED 1 DISK 7.0 (GAUZE/BANDAGES/DRESSINGS) ×6 IMPLANT
BLADE CLIPPER SURG (BLADE) IMPLANT
BLADE HEX COATED 2.75 (ELECTRODE) ×2 IMPLANT
BLADE SURG 10 STRL SS (BLADE) ×2 IMPLANT
BLADE SURG 15 STRL LF DISP TIS (BLADE) ×1 IMPLANT
BLADE SURG 15 STRL SS (BLADE) ×1
CANISTER SUCT 1200ML W/VALVE (MISCELLANEOUS) ×2 IMPLANT
CHLORAPREP W/TINT 26 (MISCELLANEOUS) ×4 IMPLANT
CLIP APPLIE 11 MED OPEN (CLIP) IMPLANT
CLIP APPLIE 9.375 MED OPEN (MISCELLANEOUS) ×3 IMPLANT
CLIP VESOCCLUDE SM WIDE 6/CT (CLIP) IMPLANT
COVER BACK TABLE REUSABLE LG (DRAPES) ×2 IMPLANT
COVER MAYO STAND REUSABLE (DRAPES) ×2 IMPLANT
COVER PROBE W GEL 5X96 (DRAPES) IMPLANT
COVER WAND RF STERILE (DRAPES) IMPLANT
DECANTER SPIKE VIAL GLASS SM (MISCELLANEOUS) IMPLANT
DERMABOND ADVANCED (GAUZE/BANDAGES/DRESSINGS) ×3
DERMABOND ADVANCED .7 DNX12 (GAUZE/BANDAGES/DRESSINGS) ×3 IMPLANT
DRAIN CHANNEL 19F RND (DRAIN) ×6 IMPLANT
DRAPE TOP ARMCOVERS (MISCELLANEOUS) ×2 IMPLANT
DRAPE U-SHAPE 76X120 STRL (DRAPES) ×2 IMPLANT
DRAPE UTILITY XL STRL (DRAPES) ×2 IMPLANT
DRSG PAD ABDOMINAL 8X10 ST (GAUZE/BANDAGES/DRESSINGS) ×2 IMPLANT
DRSG TEGADERM 4X4.75 (GAUZE/BANDAGES/DRESSINGS) ×8 IMPLANT
ELECT BLADE 4.0 EZ CLEAN MEGAD (MISCELLANEOUS)
ELECT REM PT RETURN 9FT ADLT (ELECTROSURGICAL) ×2
ELECTRODE BLDE 4.0 EZ CLN MEGD (MISCELLANEOUS) IMPLANT
ELECTRODE REM PT RTRN 9FT ADLT (ELECTROSURGICAL) ×1 IMPLANT
EVACUATOR SILICONE 100CC (DRAIN) ×6 IMPLANT
GAUZE SPONGE 4X4 12PLY STRL LF (GAUZE/BANDAGES/DRESSINGS) IMPLANT
GLOVE BIO SURGEON STRL SZ7 (GLOVE) ×2 IMPLANT
GLOVE BIOGEL PI IND STRL 7.0 (GLOVE) ×2 IMPLANT
GLOVE BIOGEL PI IND STRL 7.5 (GLOVE) ×1 IMPLANT
GLOVE BIOGEL PI INDICATOR 7.0 (GLOVE) ×2
GLOVE BIOGEL PI INDICATOR 7.5 (GLOVE) ×1
GOWN STRL REUS W/ TWL LRG LVL3 (GOWN DISPOSABLE) ×3 IMPLANT
GOWN STRL REUS W/TWL LRG LVL3 (GOWN DISPOSABLE) ×3
HEMOSTAT ARISTA ABSORB 3G PWDR (HEMOSTASIS) ×4 IMPLANT
ILLUMINATOR WAVEGUIDE N/F (MISCELLANEOUS) IMPLANT
LIGHT WAVEGUIDE WIDE FLAT (MISCELLANEOUS) ×2 IMPLANT
NDL SAFETY ECLIPSE 18X1.5 (NEEDLE) IMPLANT
NEEDLE HYPO 18GX1.5 SHARP (NEEDLE)
NEEDLE HYPO 25X1 1.5 SAFETY (NEEDLE) IMPLANT
NS IRRIG 1000ML POUR BTL (IV SOLUTION) ×2 IMPLANT
PACK BASIN DAY SURGERY FS (CUSTOM PROCEDURE TRAY) ×2 IMPLANT
PENCIL BUTTON HOLSTER BLD 10FT (ELECTRODE) ×2 IMPLANT
PIN SAFETY STERILE (MISCELLANEOUS) ×2 IMPLANT
SHEET MEDIUM DRAPE 40X70 STRL (DRAPES) ×2 IMPLANT
SLEEVE SCD COMPRESS KNEE MED (MISCELLANEOUS) ×2 IMPLANT
SPONGE LAP 18X18 RF (DISPOSABLE) ×6 IMPLANT
SPONGE LAP 4X18 RFD (DISPOSABLE) IMPLANT
STRIP CLOSURE SKIN 1/2X4 (GAUZE/BANDAGES/DRESSINGS) ×8 IMPLANT
SUT ETHILON 2 0 FS 18 (SUTURE) ×8 IMPLANT
SUT MNCRL AB 4-0 PS2 18 (SUTURE) ×2 IMPLANT
SUT SILK 2 0 SH (SUTURE) IMPLANT
SUT VIC AB 3-0 54X BRD REEL (SUTURE) IMPLANT
SUT VIC AB 3-0 BRD 54 (SUTURE)
SUT VIC AB 3-0 SH 27 (SUTURE)
SUT VIC AB 3-0 SH 27X BRD (SUTURE) IMPLANT
SUT VICRYL 3-0 CR8 SH (SUTURE) ×8 IMPLANT
SUT VICRYL AB 2 0 TIE (SUTURE) ×1 IMPLANT
SUT VICRYL AB 2 0 TIES (SUTURE) ×1
SYR CONTROL 10ML LL (SYRINGE) IMPLANT
TOWEL GREEN STERILE FF (TOWEL DISPOSABLE) ×4 IMPLANT
TUBE CONNECTING 20X1/4 (TUBING) ×4 IMPLANT
YANKAUER SUCT BULB TIP NO VENT (SUCTIONS) ×2 IMPLANT

## 2018-11-14 NOTE — Anesthesia Preprocedure Evaluation (Signed)
Anesthesia Evaluation  Patient identified by MRN, date of birth, ID band Patient awake    Reviewed: Allergy & Precautions, NPO status , Patient's Chart, lab work & pertinent test results  Airway Mallampati: II  TM Distance: >3 FB Neck ROM: Full    Dental  (+) Dental Advisory Given   Pulmonary neg pulmonary ROS,    breath sounds clear to auscultation       Cardiovascular negative cardio ROS   Rhythm:Regular Rate:Normal     Neuro/Psych negative neurological ROS     GI/Hepatic negative GI ROS, Neg liver ROS,   Endo/Other  negative endocrine ROS  Renal/GU negative Renal ROS     Musculoskeletal   Abdominal   Peds  Hematology negative hematology ROS (+) anemia ,   Anesthesia Other Findings Breast Cancer  Reproductive/Obstetrics                             Anesthesia Physical  Anesthesia Plan  ASA: III  Anesthesia Plan: General   Post-op Pain Management:  Regional for Post-op pain   Induction: Intravenous  PONV Risk Score and Plan: 3 and Dexamethasone, Ondansetron, Treatment may vary due to age or medical condition and Midazolam  Airway Management Planned: LMA and Oral ETT  Additional Equipment:   Intra-op Plan:   Post-operative Plan: Extubation in OR  Informed Consent: I have reviewed the patients History and Physical, chart, labs and discussed the procedure including the risks, benefits and alternatives for the proposed anesthesia with the patient or authorized representative who has indicated his/her understanding and acceptance.     Dental advisory given  Plan Discussed with: CRNA  Anesthesia Plan Comments:         Anesthesia Quick Evaluation

## 2018-11-14 NOTE — Discharge Instructions (Signed)
[facility], [place] [phone]  MASTECTOMY: POST OP INSTRUCTIONS Take 400 mg of ibuprofen every 8 hours or 650 mg tylenol every 6 hours for next 72 hours then as needed. Use ice several times daily also. Always review your discharge instruction sheet given to you by the facility where your surgery was performed. IF YOU HAVE DISABILITY OR FAMILY LEAVE FORMS, YOU MUST BRING THEM TO THE OFFICE FOR PROCESSING.   DO NOT GIVE THEM TO YOUR DOCTOR. A prescription for pain medication may be given to you upon discharge.  Take your pain medication as prescribed, if needed.  If narcotic pain medicine is not needed, then you may take acetaminophen (Tylenol), naprosyn (Alleve) or ibuprofen (Advil) as needed. 650mg  Tylenol taken at 4:30pm.  1. Take your usually prescribed medications unless otherwise directed. 2. If you need a refill on your pain medication, please contact your pharmacy.  They will contact our office to request authorization.  Prescriptions will not be filled after 5pm or on week-ends. 3. You should follow a light diet the first few days after arrival home, such as soup and crackers, etc.  Resume your normal diet the day after surgery. 4. Most patients will experience some swelling and bruising on the chest and underarm.  Ice packs will help.  Swelling and bruising can take several days to resolve. Wear the binder day and night until you return to the office.  5. It is common to experience some constipation if taking pain medication after surgery.  Increasing fluid intake and taking a stool softener (such as Colace) will usually help or prevent this problem from occurring.  A mild laxative (Milk of Magnesia or Miralax) should be taken according to package instructions if there are no bowel movements after 48 hours. 6. Unless discharge instructions indicate otherwise, leave your bandage dry and in place until your next appointment in 3-5 days.  You may take a limited sponge bath.  No  tube baths or showers until the drains are removed.  You may have steri-strips (small skin tapes) in place directly over the incision.  These strips should be left on the skin for 7-10 days. If you have glue it will come off in next couple week.  Any sutures will be removed at an office visit 7. DRAINS:  If you have drains in place, it is important to keep a list of the amount of drainage produced each day in your drains.  Before leaving the hospital, you should be instructed on drain care.  Call our office if you have any questions about your drains. I will remove your drains when they put out less than 30 cc or ml for 2 consecutive days. 8. ACTIVITIES:  You may resume regular (light) daily activities beginning the next day--such as daily self-care, walking, climbing stairs--gradually increasing activities as tolerated.  You may have sexual intercourse when it is comfortable.  Refrain from any heavy lifting or straining until approved by your doctor. a. You may drive when you are no longer taking prescription pain medication, you can comfortably wear a seatbelt, and you can safely maneuver your car and apply brakes. b. RETURN TO WORK:  __________________________________________________________ 9. You should see your doctor in the office for a follow-up appointment approximately 3-5 days after your surgery.  Your doctors nurse will typically make your follow-up appointment when she calls you with your pathology report.  Expect your pathology report 3-4business days after surgery. 10. OTHER INSTRUCTIONS: ______________________________________________________________________________________________ ____________________________________________________________________________________________ WHEN TO CALL YOUR DR  [place]: 1. Fever over 101.0 2. Nausea and/or vomiting 3. Extreme swelling or bruising 4. Continued bleeding from incision. 5. Increased pain, redness, or drainage from the incision. The clinic  staff is available to answer your questions during regular business hours.  Please dont hesitate to call and ask to speak to one of the nurses for clinical concerns.  If you have a medical emergency, go to the nearest emergency room or call 911.  A surgeon from [facility] is always on call at the hospital. [address] [place], [place], [place]  [address] ? [address], [place], [place]   [address] [phone] ? [phone] ? FAX [phone] Web site: [email or web address]     Information for Discharge Teaching: EXPAREL (bupivacaine liposome injectable suspension)   Your surgeon or anesthesiologist gave you EXPAREL(bupivacaine) to help control your pain after surgery.   EXPAREL is a local anesthetic that provides pain relief by numbing the tissue around the surgical site.  EXPAREL is designed to release pain medication over time and can control pain for up to 72 hours.  Depending on how you respond to EXPAREL, you may require less pain medication during your recovery.  Possible side effects:  Temporary loss of sensation or ability to move in the area where bupivacaine was injected.  Nausea, vomiting, constipation  Rarely, numbness and tingling in your mouth or lips, lightheadedness, or anxiety may occur.  Call your doctor right away if you think you may be experiencing any of these sensations, or if you have other questions regarding possible side effects.  Follow all other discharge instructions given to you by your surgeon or nurse. Eat a healthy diet and drink plenty of water or other fluids.  If you return to the hospital for any reason within 96 hours following the administration of EXPAREL, it is important for health care providers to know that you have received this anesthetic. A teal colored band has been placed on your arm with the date, time and amount of EXPAREL you have received in order to alert and inform your health care providers.  Please leave this armband in place for the full 96 hours following administration, and then you may remove the band.       Post Anesthesia Home Care Instructions  Activity: Get plenty of rest for the remainder of the day. A responsible individual must stay with you for 24 hours following the procedure.  For the next 24 hours, DO NOT: -Drive a car -Paediatric nurse -Drink alcoholic beverages -Take any medication unless instructed by your physician -Make any legal decisions or sign important papers.  Meals: Start with liquid foods such as gelatin or soup. Progress to regular foods as tolerated. Avoid greasy, spicy, heavy foods. If nausea and/or vomiting occur, drink only clear liquids until the nausea and/or vomiting subsides. Call your physician if vomiting continues.  Special Instructions/Symptoms: Your throat may feel dry or sore from the anesthesia or the breathing tube placed in your throat during surgery. If this causes discomfort, gargle with warm salt water. The discomfort should disappear within 24 hours.  If you had a scopolamine patch placed behind your ear for the management of post- operative nausea and/or vomiting:  1. The medication in the patch is effective for 72 hours, after which it should be removed.  Wrap patch in a tissue and discard in the trash. Wash hands thoroughly with soap and water. 2. You may remove the patch earlier than 72 hours if you experience  unpleasant side effects which may include dry mouth, dizziness or visual disturbances. 3. Avoid touching the patch. Wash your hands with soap and water after contact with the patch.     About my Jackson-Pratt Bulb Drain  What is a Jackson-Pratt bulb? A Jackson-Pratt is a soft, round device used to collect drainage. It is connected to a long, thin drainage catheter, which is held in place by one or two small stiches near your surgical incision site. When the bulb is squeezed, it forms a vacuum, forcing the  drainage to empty into the bulb.  Emptying the Jackson-Pratt bulb- To empty the bulb: 1. Release the plug on the top of the bulb. 2. Pour the bulb's contents into a measuring container which your nurse will provide. 3. Record the time emptied and amount of drainage. Empty the drain(s) as often as your     doctor or nurse recommends.  Date                  Time                    Amount (Drain 1)                 Amount (Drain 2)  _____________________________________________________________________  _____________________________________________________________________  _____________________________________________________________________  _____________________________________________________________________  _____________________________________________________________________  _____________________________________________________________________  _____________________________________________________________________  _____________________________________________________________________  Squeezing the Jackson-Pratt Bulb- To squeeze the bulb: 1. Make sure the plug at the top of the bulb is open. 2. Squeeze the bulb tightly in your fist. You will hear air squeezing from the bulb. 3. Replace the plug while the bulb is squeezed. 4. Use a safety pin to attach the bulb to your clothing. This will keep the catheter from     pulling at the bulb insertion site.  When to call your doctor- Call your doctor if:  Drain site becomes red, swollen or hot.  You have a fever greater than 101 degrees F.  There is oozing at the drain site.  Drain falls out (apply a guaze bandage over the drain hole and secure it with tape).  Drainage increases daily not related to activity patterns. (You will usually have more drainage when you are active than when you are resting.)  Drainage has a bad odor.

## 2018-11-14 NOTE — Anesthesia Procedure Notes (Signed)
Procedure Name: LMA Insertion Date/Time: 11/14/2018 9:23 AM Performed by: Lieutenant Diego, CRNA Pre-anesthesia Checklist: Patient identified, Emergency Drugs available, Suction available and Patient being monitored Patient Re-evaluated:Patient Re-evaluated prior to induction Oxygen Delivery Method: Circle system utilized Preoxygenation: Pre-oxygenation with 100% oxygen Induction Type: IV induction Ventilation: Mask ventilation without difficulty LMA: LMA inserted LMA Size: 4.0 Number of attempts: 1 Placement Confirmation: positive ETCO2 and breath sounds checked- equal and bilateral Tube secured with: Tape Dental Injury: Teeth and Oropharynx as per pre-operative assessment

## 2018-11-14 NOTE — OR Nursing (Signed)
Bladder scan performed per Dr. Donne Hazel in [place]. Results 59ml. Dr. Donne Hazel present and notified. Pt tolerated procedure without distress.

## 2018-11-14 NOTE — Op Note (Signed)
Preoperative diagnosis: 1.  Clinical stage III right breast cancer status post primary chemotherapy 2.  Family history of breast cancer Postoperative diagnosis: Same as above Procedure: 1.  Right modified radical mastectomy 2.  Left risk reducing mastectomy Surgeon: Dr. Serita Grammes Assistant: Christen Bame Anesthesia: General with bilateral pectoral blocks Estimated blood loss: 30 cc Complications: None Drains: 1.  219 [place] Blake drains on the right side 2.  119 [place] Blake drain on the left side Specimens: 1.  Left breast tissue marked short stitch superior, long stitch lateral 2.  Right modified radical mastectomy marked short superior and long lateral 3.  Additional right axillary lymph nodes Disposition to recovery stable condition Sponge needle count was correct at completion  Indications: This a 43 year old female who had a clinical stage III right breast cancer upon presentation.  She had over 7 abnormal lymph nodes in her right axilla with a very large primary tumor.  She has undergone primary chemotherapy.  She has had a good radiologic and clinical response.  We discussed all of the options and have elected to proceed with a right modified radical mastectomy as she desires a left risk reducing mastectomy as well.  Procedure: After informed consent was obtained the patient first underwent bilateral pectoral blocks.  She was given antibiotics.  SCDs were in place.  She was then placed under general anesthesia without complication.  She was prepped and draped in the standard sterile surgical fashion.  Surgical timeout was then performed.  I did the left risk reducing mastectomy first.  I made an elliptical incision to encompass the nipple areolar complex.  I created flaps the parasternal region, inframammary fold, clavicle, and latissimus laterally.  I then remove the breast including the fascia off of the muscle.  This was passed off the table.  Hemostasis was obtained.  I then  placed a 49 [place] Blake drain and secured this with a 2-0 nylon.  I then closed the dermis with 3-0 Vicryl.  The skin was closed with 4-0 Monocryl and eventually I placed Dermabond and Steri-Strips.  I then performed the right modified radical mastectomy.  I made a similar appearing elliptical incision to encompass the nipple areolar complex.  She had declined reconstruction preoperatively.  I then created flaps in a similar fashion.  I then remove the breast and the pectoralis fascia from the muscle and rolled this laterally.  Her axilla really did not have any enlarged palpable nodes but it was very scarred in.  I then remove the breast tissue to give me better access.  I then identified the axillary vein which had numerous lymph nodes that were stuck to it.  I carefully remove these and placed clips on the axillary vein side.  I was able to identify the long thoracic nerve and the thoracodorsal bundle and these were preserved.  I cleared her axilla of any tissue and there were no more palpable nodes present upon completion.  Hemostasis was then observed.  I did place some Arista in her axilla.  I then placed 219 [place] Blake drains on the right side and secured this with 2-0 nylon.  I then closed this with 3-0 Vicryl and 4-0 Monocryl.  Glue and Steri-Strips were applied.  She tolerated this well.  We bladder scanned her at the end of the operation there were 70 cc so we did not catheterize her.  She tolerated this well was extubated and transferred to recovery stable.

## 2018-11-14 NOTE — Anesthesia Procedure Notes (Signed)
Anesthesia Regional Block: Pectoralis block   Pre-Anesthetic Checklist: ,, timeout performed, Correct Patient, Correct Site, Correct Laterality, Correct Procedure, Correct Position, site marked, Risks and benefits discussed,  Surgical consent,  Pre-op evaluation,  At surgeon's request and post-op pain management  Laterality: Right  Prep: chloraprep       Needles:  Injection technique: Single-shot  Needle Type: Stimiplex     Needle Length: 9cm  Needle Gauge: 21     Additional Needles:   Procedures:,,,, ultrasound used (permanent image in chart),,,,  Narrative:  Start time: 11/14/2018 9:14 AM End time: 11/14/2018 9:16 AM Injection made incrementally with aspirations every 5 mL.  Performed by: Personally  Anesthesiologist: [name] Rainwater, MD

## 2018-11-14 NOTE — Progress Notes (Signed)
Assisted Dr. Miller with right, ultrasound guided, pectoralis block. Side rails up, monitors on throughout procedure. See vital signs in flow sheet. Tolerated Procedure well. 

## 2018-11-14 NOTE — Transfer of Care (Signed)
Immediate Anesthesia Transfer of Care Note  Patient: Darlene Grant  Procedure(s) Performed: RIGHT MASTECTOMY WITH LEFT AXILLARY LYMPH NODE DISSECTION AND LEFT RISK REDUCING MASTECTOMY (Bilateral Breast)  Patient Location: PACU  Anesthesia Type:General  Level of Consciousness: awake  Airway & Oxygen Therapy: Patient Spontanous Breathing and Patient connected to nasal cannula oxygen  Post-op Assessment: Report given to RN and Post -op Vital signs reviewed and stable  Post vital signs: Reviewed and stable  Last Vitals:  Vitals Value Taken Time  BP 124/84 11/14/2018 12:04 PM  Temp    Pulse 91 11/14/2018 12:06 PM  Resp 19 11/14/2018 12:06 PM  SpO2 100 % 11/14/2018 12:06 PM  Vitals shown include unvalidated device data.  Last Pain:  Vitals:   11/14/18 0716  TempSrc: Oral  PainSc: 0-No pain         Complications: No apparent anesthesia complications

## 2018-11-14 NOTE — H&P (Signed)
Darlene Grant is an 43 y.o. female.   Chief Complaint: breast cancer HPI:  35 yof I saw in December for new tnbc. she has fh of mom with breast cancer at age 58. she stopped breastfeeding 8 months ago. she has prior history of 2016 core biopsy that was benign. has gotten some mris and annual mm due to fh. she has four and a 41 year old. 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 she noted a right breast mass. she was evaluated with mm and [place]. she has c density breasts. on [place] there is a 3.7x1.7x1.5 cm uoq mass and 7 abnormal nodes present. 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 she had an mri initally with seven abnormal right axillary nodes and a 3.7 cm right uoq mass with another 7 cm nme. she now has undergone primary chemo via right sided port I placed that repeat mri shows no abnormality- a complete radiologic response. after second cycle AC she couldnt feel it. only issue with chemo is low hb which on 5/1 was 7.8. this is planned to be rechecked the 15th and preop. she is here to discuss definitive surgery   Past Medical History:  Diagnosis Date  . Abnormal Pap smear    ASCUS 03/13/11  . Anemia   . Breast pain, left   . Cancer ([place])   . Candida vaginitis   . Complication of anesthesia    pt states that all narcotics make her angry and she would prefer to avoid them.  . Rectal itching   . Vaginal Pap smear, abnormal     Past Surgical History:  Procedure Laterality Date  . BREAST BIOPSY Right 8/16  . CESAREAN SECTION N/A 11/21/2013   Procedure: CESAREAN SECTION;  Surgeon: Alwyn Pea, MD;  Location: [facility];  Service: Obstetrics;  Laterality: N/A;  . CESAREAN SECTION N/A 06/23/2016   Procedure: CESAREAN SECTION;  Surgeon: Emily Filbert, MD;  Location: [facility];  Service: Obstetrics;  Laterality: N/A;  . HAND SURGERY  04/2012   ligament repair  . PILONIDAL CYST EXCISION  2000  . PORTACATH PLACEMENT N/A  05/28/2018   Procedure: INSERTION PORT-A-CATH WITH ULTRASOUND;  Surgeon: Rolm Bookbinder, MD;  Location: [facility];  Service: General;  Laterality: N/A;  . WISDOM TOOTH EXTRACTION  AGE 73    Family History  Problem Relation Age of Onset  . Cancer Mother 35       breast, premenopausal  . Breast cancer Mother   . Cancer Maternal Grandmother        skin, stomach  . Cancer Maternal Grandfather        lung   Social History:  reports that she has never smoked. She has never used smokeless tobacco. She reports current alcohol use. She reports that she does not use drugs.  Allergies:  Allergies  Allergen Reactions  . Augmentin [Amoxicillin-Pot Clavulanate]     diarrhea  . Hydrocodone Itching  . Keflex [Cephalexin] Diarrhea    Medications Prior to Admission  Medication Sig Dispense Refill  . loratadine (CLARITIN) 10 MG tablet Take 10 mg by mouth daily as needed for allergies.    . Probiotic Product (PROBIOTIC DAILY PO) Take 1 tablet by mouth.    . cyclobenzaprine (FLEXERIL) 10 MG tablet Take 0.5-1 tablets (5-10 mg total) by mouth 3 (three) times daily as needed for muscle spasms. 30 tablet 1  .  fluticasone (FLONASE) 50 MCG/ACT nasal spray Place 2 sprays into both nostrils daily. 16 g 2  . lidocaine-prilocaine (EMLA) cream Apply to affected area once 30 g 3  . LORazepam (ATIVAN) 0.5 MG tablet Take 1 tablet (0.5 mg total) by mouth at bedtime as needed (Nausea or vomiting). 30 tablet 0  . prochlorperazine (COMPAZINE) 10 MG tablet Take 1 tablet (10 mg total) by mouth every 6 (six) hours as needed (Nausea or vomiting). 30 tablet 1    Results for orders placed or performed during the hospital encounter of 11/14/18 (from the past 48 hour(s))  Pregnancy, urine POC     Status: None   Collection Time: 11/14/18  7:08 AM  Result Value Ref Range   Preg Test, Ur NEGATIVE NEGATIVE    Comment:        THE SENSITIVITY OF THIS METHODOLOGY IS >24 mIU/mL   Type and screen [facility]  [place]     Status: None   Collection Time: 11/14/18  7:20 AM  Result Value Ref Range   ABO/RH(D) A POS    Antibody Screen NEG    Sample Expiration      11/17/2018,2359 Performed at [facility], [place] [address], [place], [place] [address]   CBC     Status: Abnormal   Collection Time: 11/14/18  7:39 AM  Result Value Ref Range   WBC 2.0 (L) 4.0 - 10.5 K/uL   RBC 2.39 (L) 3.87 - 5.11 MIL/uL   Hemoglobin 8.8 (L) 12.0 - 15.0 g/dL   HCT 25.8 (L) 36.0 - 46.0 %   MCV 107.9 (H) 80.0 - 100.0 fL   MCH 36.8 (H) 26.0 - 34.0 pg   MCHC 34.1 30.0 - 36.0 g/dL   RDW 16.3 (H) 11.5 - 15.5 %   Platelets 93 (L) 150 - 400 K/uL    Comment: REPEATED TO VERIFY PLATELET COUNT CONFIRMED BY SMEAR SPECIMEN CHECKED FOR CLOTS Immature Platelet Fraction may be clinically indicated, consider ordering this additional test [id number]    nRBC 0.0 0.0 - 0.2 %    Comment: Performed at [facility], [place] [address] [place], [place] [address]   Dg Ribs Bilateral W/chest  Result Date: 11/14/2018 CLINICAL DATA:  History of breast carcinoma with bilateral rib pain, initial encounter EXAM: BILATERAL RIBS AND CHEST - 4+ VIEW COMPARISON:  05/28/2018 FINDINGS: Cardiac shadows within normal limits. The lungs are clear. Right chest wall port is noted in satisfactory position. No pneumothorax is noted. No rib abnormality is seen. IMPRESSION: No acute abnormality noted. Electronically Signed   By: Inez Catalina M.D.   On: 11/14/2018 01:18    Review of Systems  All other systems reviewed and are negative.   Blood pressure 120/73, pulse 90, temperature 98.3 F (36.8 C), temperature source Oral, resp. rate 18, height 5\' 4"  (1.626 m), weight 65.5 kg, SpO2 100 %, unknown if currently breastfeeding. Physical Exam  General Mental Status-Alert. Head and Neck Trachea-midline. Eye Sclera/Conjunctiva - Bilateral-No scleral icterus. Breast Nipples-No Discharge. Neurologic Neurologic evaluation  reveals -alert and oriented x 3 with no impairment of recent or remote memory. Lymphatic Head & Neck General Head & Neck Lymphatics: Bilateral - Description - Normal. Axillary General Axillary Region: Bilateral - Description - Normal. Note: no [place] adenopathy  Assessment/Plan BREAST CANCER OF UPPER-OUTER QUADRANT OF RIGHT FEMALE BREAST (C50.411) Story: Right MRM, left risk reducing mastectomy does not desire reconstruction now discussed indication to do alnd and mastectomies is reasonable- certianly on right even  with complete response anything less is not really good plan oncologically. discussed surgery, drains, recovery, pathology afterwards will discuss port with oncology FAMILY HISTORY OF BREAST CANCER (Z80.3) Story: due to fh and her age, dense breasts desires risk reducing mastectomy on left and I think reasonable. we discussed mastectomy scar and performance, risk, drain, does not desire reconstruction at this time. discussed still small chance could have cancer on that side and this is not 100% preventive.  Rolm Bookbinder, MD 11/14/2018, 9:01 AM

## 2018-11-14 NOTE — Anesthesia Postprocedure Evaluation (Signed)
Anesthesia Post Note  Patient: Darlene Grant  Procedure(s) Performed: RIGHT MASTECTOMY WITH LEFT AXILLARY LYMPH NODE DISSECTION AND LEFT RISK REDUCING MASTECTOMY (Bilateral Breast)     Patient location during evaluation: PACU Anesthesia Type: General Level of consciousness: awake and alert Pain management: pain level controlled Vital Signs Assessment: post-procedure vital signs reviewed and stable Respiratory status: spontaneous breathing, nonlabored ventilation and respiratory function stable Cardiovascular status: blood pressure returned to baseline and stable Postop Assessment: no apparent nausea or vomiting Anesthetic complications: no    Last Vitals:  Vitals:   11/14/18 1315 11/14/18 1330  BP: 106/73 115/74  Pulse: 66 82  Resp: 13 16  Temp:    SpO2: 100% 100%    Last Pain:  Vitals:   11/14/18 1203  TempSrc:   PainSc: [place]

## 2018-11-14 NOTE — Interval H&P Note (Signed)
History and Physical Interval Note:  11/14/2018 9:02 AM  Darlene Grant  has presented today for surgery, with the diagnosis of RIGHT BREAST CANCER.  The various methods of treatment have been discussed with the patient and family. After consideration of risks, benefits and other options for treatment, the patient has consented to  Procedure(s): RIGHT MASTECTOMY WITH LEFT AXILLARY LYMPH NODE DISSECTION AND LEFT RISK REDUCING MASTECTOMY (Bilateral) as a surgical intervention.  The patient's history has been reviewed, patient examined, no change in status, stable for surgery.  I have reviewed the patient's chart and labs.  Questions were answered to the patient's satisfaction.     Rolm Bookbinder

## 2018-11-15 ENCOUNTER — Encounter (HOSPITAL_BASED_OUTPATIENT_CLINIC_OR_DEPARTMENT_OTHER): Payer: Self-pay | Admitting: General Surgery

## 2018-11-15 DIAGNOSIS — C50911 Malignant neoplasm of unspecified site of right female breast: Secondary | ICD-10-CM | POA: Diagnosis not present

## 2018-11-15 NOTE — Discharge Summary (Signed)
Physician Discharge Summary  Patient ID: Darlene Grant MRN: [id number] DOB/AGE: August 03, 1975 43 y.o.  Admit date: 11/14/2018 Discharge date: 11/15/2018  Admission Diagnoses: Right breast cancer Fh breast cancer  Discharge Diagnoses:  Active Problems:   History of right breast cancer   Discharged Condition: good  Hospital Course: 60 yof s/p primary chemotherapy underwent right mrm and left rrm. Did well, drains with expected output, voiding, tol diet, pain controlled, will dc home same day  Consults: None  Significant Diagnostic Studies: none  Treatments: surgery: right mrm, left rrm  Discharge Exam: Blood pressure 110/78, pulse 85, temperature 98.5 F (36.9 C), resp. rate 18, height 5\' 4"  (1.626 m), weight 65.5 kg, SpO2 100 %, unknown if currently breastfeeding. Bilateral mastectomy sites without hematoma, drains thin and expected  Disposition: home   Allergies as of 11/14/2018      Reactions   Augmentin [amoxicillin-pot Clavulanate]    diarrhea   Hydrocodone Itching   Keflex [cephalexin] Diarrhea      Medication List    TAKE these medications   cyclobenzaprine 10 MG tablet Commonly known as:  FLEXERIL Take 0.5-1 tablets (5-10 mg total) by mouth 3 (three) times daily as needed for muscle spasms.   fluticasone 50 MCG/ACT nasal spray Commonly known as:  FLONASE Place 2 sprays into both nostrils daily.   lidocaine-prilocaine cream Commonly known as:  EMLA Apply to affected area once   loratadine 10 MG tablet Commonly known as:  CLARITIN Take 10 mg by mouth daily as needed for allergies.   LORazepam 0.5 MG tablet Commonly known as:  Ativan Take 1 tablet (0.5 mg total) by mouth at bedtime as needed (Nausea or vomiting).   methocarbamol 750 MG tablet Commonly known as:  ROBAXIN Take 1 tablet (750 mg total) by mouth every 8 (eight) hours as needed (use for muscle cramps/pain).   PROBIOTIC DAILY PO Take 1 tablet by mouth.   prochlorperazine 10  MG tablet Commonly known as:  COMPAZINE Take 1 tablet (10 mg total) by mouth every 6 (six) hours as needed (Nausea or vomiting).   traMADol 50 MG tablet Commonly known as:  ULTRAM Take 2 tablets (100 mg total) by mouth every 6 (six) hours as needed.      Follow-up Information    Rolm Bookbinder, MD In 1 week.   Specialty:  General Surgery Contact information: [place] [address] [phone]           Signed: Rolm Bookbinder 11/15/2018, 7:05 AM

## 2018-11-18 ENCOUNTER — Telehealth: Payer: Self-pay | Admitting: *Deleted

## 2018-11-18 NOTE — Telephone Encounter (Signed)
Left vm to assess needs after sx and to discuss next steps. Contact information provided.

## 2018-11-25 NOTE — Assessment & Plan Note (Signed)
05/03/2018: right breast upper outer quadrant and right axillary lymph node biopsy 05/03/2018 for a clinical T2 N2, stage IIIC invasive ductal carcinoma, triple negative, with an Ki-67 75%, no deleterious genetic mutations  06/03/2018-10/18/2018: Neoadjuvant chemotherapy with dose dense Adriamycin and Cytoxan followed by Taxol and carboplatin  11/14/2018: Bilateral mastectomies: Left mastectomy: Fibrocystic changes, right mastectomy: Complete pathologic response no evidence of residual cancer, 0/13 lymph nodes negative  Pathology counseling: I discussed the final pathology report of the patient provided  a copy of this report. I discussed the margins as well as lymph node surgeries.  She had a complete pathologic response.  This indicates breast prognosis overall.  We also discussed the final staging along with previously performed ER/PR and HER-2/neu testing.  Treatment plan: Adjuvant radiation followed by surveillance.  Port can be removed. There is no advantage of Xeloda or S14 18 clinical trial because she had a pathologic complete response.

## 2018-11-28 NOTE — Progress Notes (Signed)
Location of Breast Cancer: Malignant neoplasm of upper-outer quadrant of right breast in female, estrogen receptor negative [facility])  Histology per Pathology Report: 05/03/18:  Diagnosis 1. Breast, right, needle core biopsy, upper outer quadrant, 11 o'clock - INVASIVE MAMMARY CARCINOMA, SEE COMMENT. - MAMMARY CARCINOMA IN SITU. - LYMPHOVASCULAR INVASION PRESENT. 2. Lymph node, needle/core biopsy, right axilla - INVASIVE MAMMARY CARCINOMA, SEE COMMENT. Microscopic Comment 1. The carcinoma appears grade 3.  Receptor Status: ER(negative), PR (negative), Her2-neu (negative), Ki-(75%)  Did patient present with symptoms (if so, please note symptoms) or was this found on screening mammography?: Darlene Grant is a 43 y.o. female who presented for routine screening mammogram on 04/19/2018 which showed possible mass in the right breast. Diagnostic mammogram and breast ultrasound performed on 05/02/2018. These scans showed a 3.7 cm mass with imaging features highly suspicious for malignancy centered in the 10:30 o'clock position of the right breast, corresponding to the mass felt by the patient. Also seen were seven abnormal axillary lymph nodes highly suspicious for metastatic nodes.   Biopsy of the right breast upper outer quadrant 12 o'clock position revealed invasive mammary carcinoma; triple negative, Ki-67 75%. E-cadherin is positive consistent with a ductal phenotype. The carcinoma appears Grade III. 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 Biopsy of the right axillary lymph node revealed invasive mammary carcinoma. The carcinoma is similar to part one, but no lymph node tissue. This may represent a primary site or completely replaced lymph node  Past/Anticipated interventions by surgeon, if any: 11/14/18: Procedure: 1.  Right modified radical mastectomy 2.  Left risk reducing mastectomy Surgeon: Dr. Serita Grammes    Past/Anticipated interventions by  medical oncology, if any: Chemotherapy Per L. Causey 10/29/18:  (2) neoadjuvant chemotherapy consisting of doxorubicin and cyclophosphamide in dose dense fashion x4 started 06/03/2018, completed 07/15/2018, followed by paclitaxel and carboplatin weekly x12 starting 08/02/2018 (a) Echo on 05/23/2018 shows an EF of 60-65%.  (3) definitive surgery to follow (patient is planning on bilateral mastectomies with no reconstruction)  (4) adjuvant radiation to follow surgery  (5) may be a candidate for S 1418  (6) consider adjuvant capecitabine depending on final surgical results  Lymphedema issues, if any:  Pt still has a surgical drain in place. Pt is unsure if swelling is associated with post-surgical changes or true lymphedema  Pain issues, if any:  Pain is associated with drain/swelling  SAFETY ISSUES:  Prior radiation? No  Pacemaker/ICD? No  Possible current pregnancy? No  Is the patient on methotrexate? No  Current Complaints / other details:  Pt presents today for initial consult with Dr. Sondra Come for Radiation Oncology.    Loma Sousa, RN 12/02/2018,1:16 PM

## 2018-11-30 NOTE — Progress Notes (Signed)
Patient Care Team: Nicholas Lose, MD as PCP - General (Hematology and Oncology) Rolm Bookbinder, MD as Consulting Physician (General Surgery) Magrinat, Virgie Dad, MD as Consulting Physician (Oncology) Gery Pray, MD as Consulting Physician (Radiation Oncology) Emily Filbert, MD as Consulting Physician (Obstetrics and Gynecology) Lavonna Monarch, MD as Consulting Physician (Dermatology) Mauro Kaufmann, RN as Oncology Nurse Navigator Rockwell Germany, RN as Oncology Nurse Navigator  DIAGNOSIS:    ICD-10-CM   1. Malignant neoplasm of upper-outer quadrant of right breast in female, estrogen receptor negative ([place])  C50.411    Z17.1     SUMMARY OF ONCOLOGIC HISTORY: Oncology History  Malignant neoplasm of upper-outer quadrant of right breast in female, estrogen receptor negative ([place])  05/03/2018 Initial Diagnosis   right breast upper outer quadrant and right axillary lymph node biopsy 05/03/2018 for a clinical T2 N2, stage IIIC invasive ductal carcinoma, triple negative, with an Ki-67 of 75%   06/03/2018 - 10/18/2018 Neo-Adjuvant Chemotherapy   Dose dense Adriamycin and Cytoxan followed by Taxol and carboplatin x12   11/14/2018 Surgery   Bilateral mastectomies: Left mastectomy: Fibrocystic changes, right mastectomy: Complete pathologic response no evidence of residual cancer, 0/13 lymph nodes negative      CHIEF COMPLIANT: Follow-up s/p bilateral mastectomies to review pathology  INTERVAL HISTORY: Darlene Grant is a 43 y.o. with above-mentioned history of triple negative right breast cancer. She underwent neoadjuvant chemotherapy with dose dense Adriamycin and Cytoxan and weekly Taxol. On 11/14/18 she underwent bilateral mastectomies with right axillary lymph node dissection with Dr Donne Hazel for which pathology confirmed complete response to treatment and no residual carcinoma, with 13 axillary lymph nodes negative for carcinoma. She presents to the clinic today to  review the pathology report and to transfer to my care, as she was formerly a patient of Dr. Jana Hakim.   REVIEW OF SYSTEMS:   Constitutional: Denies fevers, chills or abnormal weight loss Eyes: Denies blurriness of vision Ears, nose, mouth, throat, and face: Denies mucositis or sore throat Respiratory: Denies cough, dyspnea or wheezes Cardiovascular: Denies palpitation, chest discomfort Gastrointestinal: Denies nausea, heartburn or change in bowel habits Skin: Denies abnormal skin rashes Lymphatics: Denies new lymphadenopathy or easy bruising Neurological: Denies numbness, tingling or new weaknesses Behavioral/Psych: Mood is stable, no new changes  Extremities: No lower extremity edema Breast: Bilateral mastectomies, no reconstruction, drain in the right axilla All other systems were reviewed with the patient and are negative.  I have reviewed the past medical history, past surgical history, social history and family history with the patient and they are unchanged from previous note.  ALLERGIES:  is allergic to augmentin [amoxicillin-pot clavulanate]; hydrocodone; and keflex [cephalexin].  MEDICATIONS:  Current Outpatient Medications  Medication Sig Dispense Refill  . B Complex-C (B-COMPLEX WITH VITAMIN C) tablet Take 1 tablet by mouth daily.    . Calcium Acetate-Magnesium Carb 450-200 MG TABS Take by mouth.    . cyclobenzaprine (FLEXERIL) 10 MG tablet Take 0.5-1 tablets (5-10 mg total) by mouth 3 (three) times daily as needed for muscle spasms. 30 tablet 1  . fluticasone (FLONASE) 50 MCG/ACT nasal spray Place 2 sprays into both nostrils daily. 16 g 2  . lidocaine-prilocaine (EMLA) cream Apply to affected area once 30 g 3  . loratadine (CLARITIN) 10 MG tablet Take 10 mg by mouth daily as needed for allergies.    Marland Kitchen LORazepam (ATIVAN) 0.5 MG tablet Take 1 tablet (0.5 mg total) by mouth at bedtime as needed (Nausea or vomiting). (Patient not taking:  Reported on 12/02/2018) 30 tablet 0  .  methocarbamol (ROBAXIN) 750 MG tablet Take 1 tablet (750 mg total) by mouth every 8 (eight) hours as needed (use for muscle cramps/pain). (Patient not taking: Reported on 12/02/2018) 30 tablet 2  . Multiple Vitamins-Minerals (MULTIVITAMIN WITH MINERALS) tablet Take 1 tablet by mouth daily.    . Probiotic Product (PROBIOTIC DAILY PO) Take 1 tablet by mouth.    . prochlorperazine (COMPAZINE) 10 MG tablet Take 1 tablet (10 mg total) by mouth every 6 (six) hours as needed (Nausea or vomiting). 30 tablet 1  . traMADol (ULTRAM) 50 MG tablet Take 2 tablets (100 mg total) by mouth every 6 (six) hours as needed. (Patient not taking: Reported on 12/02/2018) 12 tablet 0   No current facility-administered medications for this visit.     PHYSICAL EXAMINATION: ECOG PERFORMANCE STATUS: 1 - Symptomatic but completely ambulatory  Vitals:   12/02/18 1439  BP: 122/65  Pulse: 78  Resp: 18  Temp: 97.8 F (36.6 C)  SpO2: 100%   Filed Weights   12/02/18 1439  Weight: 142 lb 8 oz (64.6 kg)    Physical exam not done due to COVID-19 precautions  LABORATORY DATA:  I have reviewed the data as listed CMP Latest Ref Rng & Units 10/29/2018 10/18/2018 10/11/2018  Glucose 70 - 99 mg/dL 94 106(H) 88  BUN 6 - 20 mg/dL '13 12 7  '$ Creatinine 0.44 - 1.00 mg/dL 0.84 0.68 0.67  Sodium 135 - 145 mmol/L 140 139 139  Potassium 3.5 - 5.1 mmol/L 4.4 3.8 4.0  Chloride 98 - 111 mmol/L 105 105 104  CO2 22 - 32 mmol/L '27 25 25  '$ Calcium 8.9 - 10.3 mg/dL 9.6 9.0 9.0  Total Protein 6.5 - 8.1 g/dL 7.1 6.7 6.4(L)  Total Bilirubin 0.3 - 1.2 mg/dL 0.9 0.7 0.6  Alkaline Phos 38 - 126 U/L 68 83 81  AST 15 - 41 U/L 14(L) 16 13(L)  ALT 0 - 44 U/L '14 18 9    '$ Lab Results  Component Value Date   WBC 2.0 (L) 11/14/2018   HGB 8.8 (L) 11/14/2018   HCT 25.8 (L) 11/14/2018   MCV 107.9 (H) 11/14/2018   PLT 93 (L) 11/14/2018   NEUTROABS 1.3 (L) 10/29/2018    ASSESSMENT & PLAN:  Malignant neoplasm of upper-outer quadrant of right breast  in female, estrogen receptor negative ([place]) 05/03/2018: right breast upper outer quadrant and right axillary lymph node biopsy 05/03/2018 for a clinical T2 N2, stage IIIC invasive ductal carcinoma, triple negative, with an Ki-67 75%, no deleterious genetic mutations  06/03/2018-10/18/2018: Neoadjuvant chemotherapy with dose dense Adriamycin and Cytoxan followed by Taxol and carboplatin  11/14/2018: Bilateral mastectomies: Left mastectomy: Fibrocystic changes, right mastectomy: Complete pathologic response no evidence of residual cancer, 0/13 lymph nodes negative  Pathology counseling: I discussed the final pathology report of the patient provided  a copy of this report. I discussed the margins as well as lymph node surgeries.  She had a complete pathologic response.  This indicates breast prognosis overall.  We also discussed the final staging along with previously performed ER/PR and HER-2/neu testing.  Treatment plan: Adjuvant radiation followed by surveillance.  Port can be removed. There is no advantage of Xeloda or S14 18 clinical trial because she had a pathologic complete response. We discussed at length the role of surveillance imaging with CT scans and bone scans. We will perform CT abdomen and pelvis and chest and a bone scan in December and follow-up  after that.  Labs will be drawn today to evaluate her anemia. I will request Dr. Donne Hazel to remove the port. 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  As soon as the drain comes out she will be able to move it better.  No orders of the defined types were placed in this encounter.  The patient has a good understanding of the overall plan. she agrees with it. she will call with any problems that may develop before the next visit here.  Nicholas Lose, MD 12/02/2018  Julious Oka Dorshimer am acting as scribe for Dr. Nicholas Lose.  I have reviewed the above documentation for accuracy  and completeness, and I agree with the above.

## 2018-12-01 NOTE — Progress Notes (Signed)
Radiation Oncology         ([phone]) [phone] ________________________________  Name: Darlene Grant MRN: [id number]  Date: 12/02/2018  DOB: Jun 02, 1976  Re-Evaluation Note  CC: Nicholas Lose, MD  Magrinat, Virgie Dad, MD    ICD-10-CM   1. Malignant neoplasm of upper-outer quadrant of right breast in female, estrogen receptor negative ([place])  C50.411    Z17.1     Diagnosis:   Malignant neoplasm of upper-outer quadrant of right breast in female, estrogen receptor negative ([place]) Staging form: Breast, AJCC 8th Edition - Clinical stage from 05/15/2018: Stage IIIB (cT2, cN1, cM0, G3, ER-, PR-, HER2-) -   Narrative:  The patient returns today to discuss radiation treatment options. She was seen in the multidisciplinary breast clinic on 05/15/2018.   Since consultation, she underwent genetic testing on 06/13/2018 at [facility]. Results showed no deleterious mutations or variants of uncertain significance.  She has been treated with neoadjuvant chemotherapy consisting of doxorubicin and cyclophosphamide in dose dense fashion x4 started 06/03/2018, completed 07/15/2018, followed by paclitaxel and carboplatin weekly x12 starting 08/02/2018 under Dr. Jana Hakim.  She opted to proceed with bilateral mastectomies with right axillary lymph node dissection on 11/14/2018. Pathology from the procedure revealed: no residual invasive carcinoma s/p neoadjuvant treatment in the right breast; mild fibrocystic changes in the left breast; all lymph nodes negative for carcinoma in the right axillary specimen (0/13).  She still has a surgical drain in place. On review of systems, the patient reports swelling from surgery and pain associated with her drain and the swelling. She denies any other symptoms.    Allergies:  is allergic to augmentin [amoxicillin-pot clavulanate]; hydrocodone; and keflex [cephalexin].  Meds: Current Outpatient Medications  Medication Sig Dispense Refill   B Complex-C (B-COMPLEX WITH  VITAMIN C) tablet Take 1 tablet by mouth daily.     Calcium Acetate-Magnesium Carb 450-200 MG TABS Take by mouth.     cyclobenzaprine (FLEXERIL) 10 MG tablet Take 0.5-1 tablets (5-10 mg total) by mouth 3 (three) times daily as needed for muscle spasms. 30 tablet 1   fluticasone (FLONASE) 50 MCG/ACT nasal spray Place 2 sprays into both nostrils daily. 16 g 2   lidocaine-prilocaine (EMLA) cream Apply to affected area once 30 g 3   loratadine (CLARITIN) 10 MG tablet Take 10 mg by mouth daily as needed for allergies.     Multiple Vitamins-Minerals (MULTIVITAMIN WITH MINERALS) tablet Take 1 tablet by mouth daily.     Probiotic Product (PROBIOTIC DAILY PO) Take 1 tablet by mouth.     prochlorperazine (COMPAZINE) 10 MG tablet Take 1 tablet (10 mg total) by mouth every 6 (six) hours as needed (Nausea or vomiting). 30 tablet 1   LORazepam (ATIVAN) 0.5 MG tablet Take 1 tablet (0.5 mg total) by mouth at bedtime as needed (Nausea or vomiting). (Patient not taking: Reported on 12/02/2018) 30 tablet 0   methocarbamol (ROBAXIN) 750 MG tablet Take 1 tablet (750 mg total) by mouth every 8 (eight) hours as needed (use for muscle cramps/pain). (Patient not taking: Reported on 12/02/2018) 30 tablet 2   traMADol (ULTRAM) 50 MG tablet Take 2 tablets (100 mg total) by mouth every 6 (six) hours as needed. (Patient not taking: Reported on 12/02/2018) 12 tablet 0   No current facility-administered medications for this encounter.     Physical Findings: The patient is in no acute distress. Patient is alert and oriented.  height is '5\' 4"'$  (1.626 m) and weight is 142 lb 3.2 oz (64.5 kg). Her  oral temperature is 97.8 F (36.6 C). Her blood pressure is 115/87 and her pulse is 88. Her respiration is 20 and oxygen saturation is 100%.   Lungs are clear to auscultation bilaterally. Heart has regular rate and rhythm. No palpable cervical, supraclavicular, or axillary adenopathy. Abdomen soft, non-tender, normal bowel  sounds. The left chest shows a mastectomy scar which is healing well without signs of drainage or infection.  The right chest area shows a mastectomy scar which is also healing well with no signs of drainage or infection.  Patient continues to have a JP drain present in the right lower chest wall  Lab Findings: Lab Results  Component Value Date   WBC 3.3 (L) 12/02/2018   HGB 9.4 (L) 12/02/2018   HCT 29.0 (L) 12/02/2018   MCV 113.3 (H) 12/02/2018   PLT 222 12/02/2018    Radiographic Findings: Dg Ribs Bilateral W/chest  Result Date: 11/14/2018 CLINICAL DATA:  History of breast carcinoma with bilateral rib pain, initial encounter EXAM: BILATERAL RIBS AND CHEST - 4+ VIEW COMPARISON:  05/28/2018 FINDINGS: Cardiac shadows within normal limits. The lungs are clear. Right chest wall port is noted in satisfactory position. No pneumothorax is noted. No rib abnormality is seen. IMPRESSION: No acute abnormality noted. Electronically Signed   By: Inez Catalina M.D.   On: 11/14/2018 01:18    Impression: Stage IIIB (cT2, cN1, cM0, G3, ER-, PR-, HER2-) -   (ypT0, ypN0, M0) Right IDC s/p mastectomy.  She had an excellent response to her neoadjuvant chemotherapy with no residual disease in the breast specimen or axillary region.  Of note is patient was felt to have 7 abnormal lymph nodes prior to her chemotherapy.  She did have a axillary dissection in light of this significant axillary burden.  Patient will be a good candidate for postmastectomy radiation therapy along the right side.  I would also recommend elective coverage of the high axilla/supraclavicular region given her  significant axillary involvement prior to neoadjuvant chemotherapy.  Today, I talked to the patient and  about the findings and work-up thus far.  We discussed the natural history of breast cancer and general treatment, highlighting the role of radiotherapy in the management.  We discussed the available radiation techniques, and focused on  the details of logistics and delivery.  We reviewed the anticipated acute and late sequelae associated with radiation in this setting.  The patient was encouraged to ask questions that I answered to the best of my ability.  A patient consent form was discussed and signed.  We retained a copy for our records.  The patient would like to proceed with radiation and will be scheduled for CT simulation.  Plan: Patient was originally scheduled for CT simulation later today however the patient will require additional healing prior to proceeding with planning and treatment.  She is now scheduled for CT simulation on June 29 at 10 AM,  treatments to begin in early July.  anticipate 6 weeks of postmastectomy radiation therapy on the right side.  ____________________________________ Gery Pray, MD   This document serves as a record of services personally performed by Gery Pray, MD. It was created on his behalf by Wilburn Mylar, a trained medical scribe. The creation of this record is based on the scribe's personal observations and the provider's statements to them. This document has been checked and approved by the attending provider.

## 2018-12-02 ENCOUNTER — Inpatient Hospital Stay: Payer: BC Managed Care – PPO | Attending: Oncology | Admitting: Hematology and Oncology

## 2018-12-02 ENCOUNTER — Encounter: Payer: Self-pay | Admitting: Radiation Oncology

## 2018-12-02 ENCOUNTER — Ambulatory Visit
Admission: RE | Admit: 2018-12-02 | Discharge: 2018-12-02 | Disposition: A | Discharge status: Home / Self Care | Payer: BC Managed Care – PPO | Source: Ambulatory Visit | Attending: Radiation Oncology | Admitting: Radiation Oncology

## 2018-12-02 ENCOUNTER — Ambulatory Visit
Admission: RE | Admit: 2018-12-02 | Discharge: 2018-12-02 | Disposition: A | Discharge status: Home / Self Care | Payer: BLUE CROSS/BLUE SHIELD | Source: Ambulatory Visit | Attending: Radiation Oncology | Admitting: Radiation Oncology

## 2018-12-02 ENCOUNTER — Other Ambulatory Visit: Payer: Self-pay

## 2018-12-02 ENCOUNTER — Inpatient Hospital Stay: Payer: BC Managed Care – PPO

## 2018-12-02 VITALS — BP 115/87 | HR 88 | Temp 97.8°F | Resp 20 | Ht 64.0 in | Wt 142.2 lb

## 2018-12-02 DIAGNOSIS — Z9013 Acquired absence of bilateral breasts and nipples: Secondary | ICD-10-CM | POA: Diagnosis not present

## 2018-12-02 DIAGNOSIS — Z9221 Personal history of antineoplastic chemotherapy: Secondary | ICD-10-CM | POA: Diagnosis not present

## 2018-12-02 DIAGNOSIS — Z171 Estrogen receptor negative status [ER-]: Secondary | ICD-10-CM

## 2018-12-02 DIAGNOSIS — Z853 Personal history of malignant neoplasm of breast: Secondary | ICD-10-CM | POA: Diagnosis not present

## 2018-12-02 DIAGNOSIS — C50411 Malignant neoplasm of upper-outer quadrant of right female breast: Secondary | ICD-10-CM

## 2018-12-02 DIAGNOSIS — D649 Anemia, unspecified: Secondary | ICD-10-CM | POA: Diagnosis not present

## 2018-12-02 DIAGNOSIS — Z510 Encounter for antineoplastic radiation therapy: Secondary | ICD-10-CM | POA: Diagnosis not present

## 2018-12-02 DIAGNOSIS — N6459 Other signs and symptoms in breast: Secondary | ICD-10-CM | POA: Diagnosis not present

## 2018-12-02 DIAGNOSIS — Z95828 Presence of other vascular implants and grafts: Secondary | ICD-10-CM

## 2018-12-02 DIAGNOSIS — Z452 Encounter for adjustment and management of vascular access device: Secondary | ICD-10-CM | POA: Insufficient documentation

## 2018-12-02 LAB — CBC WITH DIFFERENTIAL (CANCER CENTER ONLY)
Abs Immature Granulocytes: 0.01 10*3/uL (ref 0.00–0.07)
Basophils Absolute: 0 10*3/uL (ref 0.0–0.1)
Basophils Relative: 0 %
Eosinophils Absolute: 0.1 10*3/uL (ref 0.0–0.5)
Eosinophils Relative: 2 %
HCT: 29 % — ABNORMAL LOW (ref 36.0–46.0)
Hemoglobin: 9.4 g/dL — ABNORMAL LOW (ref 12.0–15.0)
Immature Granulocytes: 0 %
Lymphocytes Relative: 27 %
Lymphs Abs: 0.9 10*3/uL (ref 0.7–4.0)
MCH: 36.7 pg — ABNORMAL HIGH (ref 26.0–34.0)
MCHC: 32.4 g/dL (ref 30.0–36.0)
MCV: 113.3 fL — ABNORMAL HIGH (ref 80.0–100.0)
Monocytes Absolute: 0.3 10*3/uL (ref 0.1–1.0)
Monocytes Relative: 9 %
Neutro Abs: 2.1 10*3/uL (ref 1.7–7.7)
Neutrophils Relative %: 62 %
Platelet Count: 222 10*3/uL (ref 150–400)
RBC: 2.56 MIL/uL — ABNORMAL LOW (ref 3.87–5.11)
RDW: 14.6 % (ref 11.5–15.5)
WBC Count: 3.3 10*3/uL — ABNORMAL LOW (ref 4.0–10.5)
nRBC: 0 % (ref 0.0–0.2)

## 2018-12-02 LAB — CMP (CANCER CENTER ONLY)
ALT: 12 U/L (ref 0–44)
AST: 14 U/L — ABNORMAL LOW (ref 15–41)
Albumin: 4.6 g/dL (ref 3.5–5.0)
Alkaline Phosphatase: 72 U/L (ref 38–126)
Anion gap: 13 (ref 5–15)
BUN: 9 mg/dL (ref 6–20)
CO2: 24 mmol/L (ref 22–32)
Calcium: 10 mg/dL (ref 8.9–10.3)
Chloride: 104 mmol/L (ref 98–111)
Creatinine: 0.75 mg/dL (ref 0.44–1.00)
GFR, Est AFR Am: >60 mL/min (ref >60)
GFR, Estimated: >60 mL/min (ref >60)
Glucose, Bld: 89 mg/dL (ref 70–99)
Potassium: 3.9 mmol/L (ref 3.5–5.1)
Sodium: 141 mmol/L (ref 135–145)
Total Bilirubin: 1 mg/dL (ref 0.3–1.2)
Total Protein: 7.3 g/dL (ref 6.5–8.1)

## 2018-12-02 MED ORDER — SODIUM CHLORIDE 0.9% FLUSH
10.0000 mL | INTRAVENOUS | Status: DC | PRN
  Administered 2018-12-02: 10 mL
  Filled 2018-12-02: qty 10

## 2018-12-02 MED ORDER — HEPARIN SOD (PORK) LOCK FLUSH 100 UNIT/ML IV SOLN
500.0000 [IU] | Freq: Once | INTRAVENOUS | Status: AC | PRN
  Administered 2018-12-02: 500 [IU]
  Filled 2018-12-02: qty 5

## 2018-12-02 NOTE — Patient Instructions (Signed)
Coronavirus (COVID-19) Are you at risk?  Are you at risk for the Coronavirus (COVID-19)?  To be considered HIGH RISK for Coronavirus (COVID-19), you have to meet the following criteria:  . Traveled to [place], [place], [place], [place] or [place]; or in the [place] to [place], [place], [place], or [place]; and have fever, cough, and shortness of breath within the last 2 weeks of travel OR . Been in close contact with a person diagnosed with COVID-19 within the last 2 weeks and have fever, cough, and shortness of breath . IF YOU DO NOT MEET THESE CRITERIA, YOU ARE CONSIDERED LOW RISK FOR COVID-19.  What to do if you are HIGH RISK for COVID-19?  . If you are having a medical emergency, call 911. . Seek medical care right away. Before you go to a doctor's office, urgent care or emergency department, call ahead and tell them about your recent travel, contact with someone diagnosed with COVID-19, and your symptoms. You should receive instructions from your physician's office regarding next steps of care.  . When you arrive at healthcare provider, tell the healthcare staff immediately you have returned from visiting [place], [place], [place], [place] or [place]; or traveled in the [place] to [place], [place], [place], or [place]; in the last two weeks or you have been in close contact with a person diagnosed with COVID-19 in the last 2 weeks.   . Tell the health care staff about your symptoms: fever, cough and shortness of breath. . After you have been seen by a medical provider, you will be either: o Tested for (COVID-19) and discharged home on quarantine except to seek medical care if symptoms worsen, and asked to  - Stay home and avoid contact with others until you get your results (4-5 days)  - Avoid travel on public transportation if possible (such as bus, train, or airplane) or o Sent to the Emergency Department by EMS for evaluation, COVID-19 testing, and possible  admission depending on your condition and test results.  What to do if you are LOW RISK for COVID-19?  Reduce your risk of any infection by using the same precautions used for avoiding the common cold or flu:  . Wash your hands often with soap and warm water for at least 20 seconds.  If soap and water are not readily available, use an alcohol-based hand sanitizer with at least 60% alcohol.  . If coughing or sneezing, cover your mouth and nose by coughing or sneezing into the elbow areas of your shirt or coat, into a tissue or into your sleeve (not your hands). . Avoid shaking hands with others and consider head nods or verbal greetings only. . Avoid touching your eyes, nose, or mouth with unwashed hands.  . Avoid close contact with people who are sick. . Avoid places or events with large numbers of people in one location, like concerts or sporting events. . Carefully consider travel plans you have or are making. . If you are planning any travel outside or inside the [place], visit the CDC's Travelers' Health webpage for the latest health notices. . If you have some symptoms but not all symptoms, continue to monitor at home and seek medical attention if your symptoms worsen. . If you are having a medical emergency, call 911.   ADDITIONAL HEALTHCARE OPTIONS FOR PATIENTS  [facility] / e-Visit: [email or web address]         [facility]: [phone]  [facility]: [phone]                   [facility]: [phone]   

## 2018-12-03 ENCOUNTER — Telehealth: Payer: Self-pay | Admitting: Hematology and Oncology

## 2018-12-03 ENCOUNTER — Encounter: Payer: Self-pay | Admitting: *Deleted

## 2018-12-03 NOTE — Telephone Encounter (Signed)
I left a message regarding schedule  

## 2018-12-05 ENCOUNTER — Telehealth: Payer: Self-pay

## 2018-12-05 NOTE — Telephone Encounter (Signed)
LVM. Introduced self to patient. Dr. Lindi Adie asked this RN to reach out to patient about [id number] ABC trial. Explained to patient that this RN would mail her some information about the trial and that, since she cannot enroll in the trial until after her radiation treatment is complete, this RN would contact closer to that time. Provided this RN's phone number if patient had any questions or concerns.  Johney Maine RN, BSN Clinical Research  12/05/18 11:24 AM

## 2018-12-06 ENCOUNTER — Ambulatory Visit
Admission: RE | Admit: 2018-12-06 | Discharge: 2018-12-06 | Disposition: A | Discharge status: Home / Self Care | Payer: BC Managed Care – PPO | Source: Ambulatory Visit | Attending: General Surgery | Admitting: General Surgery

## 2018-12-06 ENCOUNTER — Telehealth: Payer: Self-pay | Admitting: Hematology and Oncology

## 2018-12-06 ENCOUNTER — Other Ambulatory Visit: Payer: Self-pay | Admitting: General Surgery

## 2018-12-06 DIAGNOSIS — R0602 Shortness of breath: Secondary | ICD-10-CM | POA: Diagnosis not present

## 2018-12-06 NOTE — Telephone Encounter (Signed)
Spoke with patient re 8/17 f/u.

## 2018-12-12 ENCOUNTER — Encounter: Payer: Self-pay | Admitting: Hematology and Oncology

## 2018-12-13 DIAGNOSIS — Z452 Encounter for adjustment and management of vascular access device: Secondary | ICD-10-CM | POA: Diagnosis not present

## 2018-12-16 ENCOUNTER — Other Ambulatory Visit: Payer: Self-pay

## 2018-12-16 ENCOUNTER — Ambulatory Visit
Admission: RE | Admit: 2018-12-16 | Discharge: 2018-12-16 | Disposition: A | Discharge status: Home / Self Care | Payer: BC Managed Care – PPO | Source: Ambulatory Visit | Attending: Radiation Oncology | Admitting: Radiation Oncology

## 2018-12-16 DIAGNOSIS — Z510 Encounter for antineoplastic radiation therapy: Secondary | ICD-10-CM | POA: Diagnosis not present

## 2018-12-16 DIAGNOSIS — Z171 Estrogen receptor negative status [ER-]: Secondary | ICD-10-CM | POA: Diagnosis not present

## 2018-12-16 DIAGNOSIS — C50411 Malignant neoplasm of upper-outer quadrant of right female breast: Secondary | ICD-10-CM | POA: Diagnosis not present

## 2018-12-16 DIAGNOSIS — Z9221 Personal history of antineoplastic chemotherapy: Secondary | ICD-10-CM | POA: Diagnosis not present

## 2018-12-16 DIAGNOSIS — Z9013 Acquired absence of bilateral breasts and nipples: Secondary | ICD-10-CM | POA: Diagnosis not present

## 2018-12-17 ENCOUNTER — Encounter: Payer: Self-pay | Admitting: Radiation Oncology

## 2018-12-18 ENCOUNTER — Ambulatory Visit: Payer: BC Managed Care – PPO | Attending: General Surgery | Admitting: Physical Therapy

## 2018-12-18 ENCOUNTER — Other Ambulatory Visit: Payer: Self-pay

## 2018-12-18 DIAGNOSIS — R293 Abnormal posture: Secondary | ICD-10-CM

## 2018-12-18 DIAGNOSIS — M25611 Stiffness of right shoulder, not elsewhere classified: Secondary | ICD-10-CM

## 2018-12-18 DIAGNOSIS — Z483 Aftercare following surgery for neoplasm: Secondary | ICD-10-CM | POA: Diagnosis not present

## 2018-12-18 DIAGNOSIS — M25511 Pain in right shoulder: Secondary | ICD-10-CM | POA: Diagnosis not present

## 2018-12-18 DIAGNOSIS — M25612 Stiffness of left shoulder, not elsewhere classified: Secondary | ICD-10-CM | POA: Insufficient documentation

## 2018-12-18 DIAGNOSIS — M6281 Muscle weakness (generalized): Secondary | ICD-10-CM

## 2018-12-18 NOTE — Patient Instructions (Signed)
1. Decompression Exercise     Cancer Rehab [id number] ° ° ° °Lie on back on firm surface, knees bent, feet flat, arms turned up, out to sides, backs of hands down. Time _5-15__ minutes. °Surface: floor  ° °2. Shoulder Press ° ° ° °Start in Decompression Exercise position. Press shoulders downward towards supporting surface. Hold __2-3__ seconds while counting out loud. °Repeat _3-5___ times. Do _1-2___ times per day. ° ° °3. Head Press ° ° ° °Bring cervical spine (neck) into neutral position (by either tucking the chin towards the chest or tilting the chin upward). Feel weight on back of head. Press head downward into supporting surface.    Hold _2-3__ seconds. °Repeat _3-5__ times. Do _1-2__ times per day. °  °4. Leg Lengthener ° ° ° °Straighten one leg. Pull toes AND forefoot toward knee, extend heel. Lengthen leg by pulling pelvis away from ribs. Hold _2-3__ seconds. Relax. Repeat __4-6__ times. Do other leg.  °Surface: floor  ° °5. Leg Press ° ° ° °Straighten one leg down to floor keeping leg aligned with hip. Pull toes AND forefoot toward knee; extend heel.  Press entire leg downward (as if pressing leg into sandy beach). DO NOT BEND KNEE. Hold _2-3__ seconds. Do __4-6__ times. Repeat with other leg.  ° °SHOULDER: Flexion - Supine (Cane)        Cancer Rehab [phone] ° ° ° °Hold cane in both hands. Raise arms up overhead. Do not allow back to arch. Hold _5__ seconds. Do __5-10__ times; __1-2__ times a day. ° ° °SELF ASSISTED WITH OBJECT: Shoulder Abduction / Adduction - Supine ° ° ° °Hold cane with both hands. Move both arms from side to side, keep elbows straight.  Hold when stretch felt for __5__ seconds. Repeat __5-10__ times; __1-2__ times a day. Once this becomes easier progress to third picture bringing affected arm towards ear by staying out to side. Same hold for _5_seconds. Repeat  _5-10_ times, _1-2_ times/day. ° °Shoulder Blade Stretch ° ° ° °Clasp fingers behind head with elbows touching in front of  face. Pull elbows back while pressing shoulder blades together. Relax and hold as tolerated, can place pillow under elbow here for comfort as needed and to allow for prolonged stretch.  °Repeat __5__ times. Do __1-2__ sessions per day. ° ° ° ° °SHOULDER: External Rotation - Supine (Cane) ° ° ° °Hold cane with both hands. Rotate arm away from body. Keep elbow on floor and next to body. _5-10__ reps per set, hold 5 seconds, _1-2__ sets per day. °Add towel to keep elbow at side. ° °Copyright © [employer]. All rights reserved.  ° ° ° ° ° °

## 2018-12-18 NOTE — Therapy (Signed)
[place], [place], [address] Phone: [phone]   Fax:  [phone]  Physical Therapy Re-Evaluation  Patient Details  Name: Darlene Grant MRN: [id number] Date of Birth: [birth year] Referring Provider (PT): [place]    Encounter Date: 12/18/2018  PT End of Session - 12/18/18 1725    Visit Number  2    Number of Visits  9    Date for PT Re-Evaluation  01/20/19    PT Start Time  1530    PT Stop Time  1615    PT Time Calculation (min)  45 min    Activity Tolerance  Patient tolerated treatment well    Behavior During Therapy  [facility] for tasks assessed/performed       Past Medical History:  Diagnosis Date  . Abnormal Pap smear    ASCUS 03/13/11  . Anemia   . Breast pain, left   . Cancer ([place])   . Candida vaginitis   . Complication of anesthesia    pt states that all narcotics make her angry and she would prefer to avoid them.  . Rectal itching   . Vaginal Pap smear, abnormal     Past Surgical History:  Procedure Laterality Date  . BREAST BIOPSY Right 8/16  . CESAREAN SECTION N/A 11/21/2013   Procedure: CESAREAN SECTION;  Surgeon: Alwyn Pea, MD;  Location: [facility];  Service: Obstetrics;  Laterality: N/A;  . CESAREAN SECTION N/A 06/23/2016   Procedure: CESAREAN SECTION;  Surgeon: Emily Filbert, MD;  Location: [place];  Service: Obstetrics;  Laterality: N/A;  . HAND SURGERY  04/2012   ligament repair  . MASTECTOMY WITH AXILLARY LYMPH NODE DISSECTION Bilateral 11/14/2018   Procedure: RIGHT MASTECTOMY WITH LEFT AXILLARY LYMPH NODE DISSECTION AND LEFT RISK REDUCING MASTECTOMY;  Surgeon: Rolm Bookbinder, MD;  Location: [place];  Service: General;  Laterality: Bilateral;  . PILONIDAL CYST EXCISION  2000  . PORTACATH PLACEMENT N/A 05/28/2018   Procedure: INSERTION PORT-A-CATH WITH ULTRASOUND;  Surgeon: Rolm Bookbinder, MD;  Location: [place];   Service: General;  Laterality: N/A;  . WISDOM TOOTH EXTRACTION  AGE 43    There were no vitals filed for this visit.   Subjective Assessment - 12/18/18 1719    Subjective  pt reports she will have radiation starting the week of the 14th. She says the pulling from the cording has been better yesterday and today and has been helped by wearing the compression sleeve    Pertinent History  breast cancer diagnosed November 2019: triple negative with Ki of 75% She completed chemotherapy 10/18/2018. 11/14/2018: she underwent bilateral mastectomy with 0/13 nodes positive on the right    Currently in Pain?  Yes    Pain Score  --   did not rate   Pain Location  Axilla    Pain Orientation  Right    Pain Descriptors / Indicators  Aching;Tightness    Pain Type  Surgical pain    Pain Radiating Towards  down into right arm    Pain Onset  More than a month ago    Aggravating Factors   bringing her arm out to the side    Pain Relieving Factors  compression sleeve helps    Effect of Pain on Daily Activities  cannot reach overhead         [facility] PT Assessment - 12/18/18 0001      Assessment   Medical Diagnosis  right breast  cancer     Referring Provider (PT)  [place]     Onset Date/Surgical Date  04/19/18    Hand Dominance  Right      Precautions   Precautions  Other (comment)    Precaution Comments  recent chemo      Restrictions   Weight Bearing Restrictions  No      Home Environment   Living Environment  Private residence    Living Arrangements  Spouse/significant other;Children   41 and 63 year old   Available Help at Discharge  Family      Prior Function   Level of Independence  Independent    Vocation  Unemployed   taking care of small childen   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     Leisure  likes to walk and exercise regularly, well educated in exercise    pt reports she has been exercsing throughout chemo     Cognition   Overall Cognitive Status  Within Functional Limits for  tasks assessed      Observation/Other Assessments   Observations  pt with healing incisions on both sides of chest with steristrips intact     Quick DASH   9.09      Coordination   Gross Motor Movements are Fluid and Coordinated  Yes      Posture/Postural Control   Posture/Postural Control  Postural limitations    Postural Limitations  Increased lumbar lordosis;Decreased thoracic kyphosis      AROM   Right Shoulder Flexion  137 Degrees    Right Shoulder ABduction  108 Degrees    Right Shoulder External Rotation  90 Degrees    Left Shoulder Flexion  --    Left Shoulder ABduction  --    Left Shoulder External Rotation  --      Strength   Overall Strength  Deficits    Overall Strength Comments  right shoulder 3-/5 due to pain and pulling from axillary cording       Palpation   Palpation comment  pt has visible thick cord from axilla to lateral right chest incision with shoulder abudciton.  In sidelying on left side, several guitar string cords are felt in axilla Pt also feel pulling into right medial upper ar              [name] - 12/18/18 0001    Open a tight or new jar  No difficulty    Do heavy household chores (wash walls, wash floors)  No difficulty    Carry a shopping bag or briefcase  No difficulty    Wash your back  No difficulty    Use a knife to cut food  No difficulty    Recreational activities in which you take some force or impact through your arm, shoulder, or hand (golf, hammering, tennis)  Mild difficulty    During the past week, to what extent has your arm, shoulder or hand problem interfered with your normal social activities with family, friends, neighbors, or groups?  Not at all    During the past week, to what extent has your arm, shoulder or hand problem limited your work or other regular daily activities  Slightly    Arm, shoulder, or hand pain.  Mild    Tingling (pins and needles) in your arm, shoulder, or hand  None    Difficulty Sleeping  Mild  difficulty    DASH Score  9.09 %  Objective measurements completed on examination: See above findings.      [facility] Adult PT Treatment/Exercise - 12/18/18 0001      Exercises   Exercises  Other Exercises    Other Exercises   instructed in Meeks deompression exercises and supine dowel rod stretches for right shoulder within pain limit       Manual Therapy   Manual Therapy  Edema management;Manual Lymphatic Drainage (MLD);Passive ROM    Edema Management  provided chip pack to wear under bra strap at anterior axilla and at areas of cording in axilla              Manual Lymphatic Drainage (MLD)  in supine, short neck, superficial and deep abdominals. right inguinal nodes, left axillary nodes , avoided anterior chest around incisions, right anterior and posterior shoulder and down right arm to hand with return along pathways, the to left sidelying for posterior interaxillary anastamosis and back    Passive ROM  gentle PROM to right shoulder to available end range in shoulder flexion, abudciton and rotation,              PT Education - 12/18/18 1724    Education Details  Meeks decompression and supine dowel stretches    Person(s) Educated  Patient    Methods  Explanation;Demonstration;Handout    Comprehension  Verbalized understanding;Returned demonstration          PT Long Term Goals - 12/18/18 1733      PT LONG TERM GOAL #1   Title  Pt will have 150 degrees of painfree right shoulder abduction so that she can perform her household chores easier    Baseline  108 with pain    Time  4    Period  Weeks    Status  New      PT LONG TERM GOAL #2   Title  Pt will report 50% decrease in right shoulder tightness and pulling from axillary cording    Time  4    Period  Weeks    Status  New      PT LONG TERM GOAL #3   Title  Pt will be independent in a home exercise program for shoulder ROM and strength    Time  4    Period  Weeks    Status  New              Plan - 12/18/18 1725    Clinical Impression Statement  Pt returns to PT after bilateral mastectomy with 13 nodes removed.  She is having some diffiuclty with axillary cording with resultant pain and decreased ROM. She will be starting radiation in 2 weeks. She will benefit from continued PT for manual work and exercise    Personal Factors and Comorbidities  Comorbidity 1    Comorbidities  recent chemo    Examination-Activity Limitations  Bathing;Reach Overhead;Caring for Others;Lift;Hygiene/Grooming    Stability/Clinical Decision Making  Evolving/Moderate complexity    Clinical Decision Making  Moderate    Rehab Potential  Good    PT Frequency  2x / week    PT Duration  4 weeks    PT Treatment/Interventions  ADLs/Self Care Home Management;Therapeutic exercise    PT Next Visit Plan  MLD, manual work for axillary cording, progress exercises.       Patient will benefit from skilled therapeutic intervention in order to improve the following deficits and impairments:  Decreased knowledge of precautions, Decreased knowledge of use of DME, Postural dysfunction, Decreased  range of motion, Increased fascial restricitons, Impaired UE functional use, Increased muscle spasms, Impaired perceived functional ability, Pain, Decreased scar mobility, Decreased strength, Increased edema  Visit Diagnosis: 1. Aftercare following surgery for neoplasm   2. Stiffness of right shoulder joint   3. Acute pain of right shoulder   4. Abnormal posture   5. Muscle weakness (generalized)        Problem List Patient Active Problem List   Diagnosis Date Noted  . History of right breast cancer 11/14/2018  . Chemotherapy induced neutropenia ([place]) 09/13/2018  . Port-A-Cath in place 06/10/2018  . Malignant neoplasm of upper-outer quadrant of right breast in female, estrogen receptor negative ([place]) 05/09/2018  . Low iron stores 01/16/2018  . Muscle spasm 01/15/2018  . Piriformis syndrome of left side  01/15/2018  . Grade I hemorrhoids 02/23/2017  . Low lying posterior placenta, antepartum 02/01/2016  . AMA (advanced maternal age) multigravida 35+ 11/22/2015  . 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 Calcification of right breast 02/19/2015  . Ligament tear 07/05/2012   Donato Heinz. Owens Shark PT  Norwood Levo 12/18/2018, 5:41 PM  [place], [place], [address] Phone: [phone]   Fax:  [phone]  Name: Darlene Grant MRN: [id number] Date of Birth: 1976-02-20

## 2018-12-19 DIAGNOSIS — Z9013 Acquired absence of bilateral breasts and nipples: Secondary | ICD-10-CM | POA: Insufficient documentation

## 2018-12-19 DIAGNOSIS — Z9221 Personal history of antineoplastic chemotherapy: Secondary | ICD-10-CM | POA: Insufficient documentation

## 2018-12-19 DIAGNOSIS — C50411 Malignant neoplasm of upper-outer quadrant of right female breast: Secondary | ICD-10-CM | POA: Diagnosis not present

## 2018-12-19 DIAGNOSIS — Z510 Encounter for antineoplastic radiation therapy: Secondary | ICD-10-CM | POA: Insufficient documentation

## 2018-12-19 DIAGNOSIS — Z171 Estrogen receptor negative status [ER-]: Secondary | ICD-10-CM | POA: Insufficient documentation

## 2018-12-22 ENCOUNTER — Encounter: Payer: Self-pay | Admitting: Hematology and Oncology

## 2018-12-23 ENCOUNTER — Ambulatory Visit: Payer: BC Managed Care – PPO | Admitting: Radiation Oncology

## 2018-12-23 ENCOUNTER — Other Ambulatory Visit: Payer: Self-pay

## 2018-12-23 ENCOUNTER — Ambulatory Visit: Payer: BC Managed Care – PPO | Admitting: Physical Therapy

## 2018-12-23 ENCOUNTER — Encounter: Payer: Self-pay | Admitting: Physical Therapy

## 2018-12-23 DIAGNOSIS — Z483 Aftercare following surgery for neoplasm: Secondary | ICD-10-CM

## 2018-12-23 DIAGNOSIS — R293 Abnormal posture: Secondary | ICD-10-CM | POA: Diagnosis not present

## 2018-12-23 DIAGNOSIS — M25511 Pain in right shoulder: Secondary | ICD-10-CM | POA: Diagnosis not present

## 2018-12-23 DIAGNOSIS — M25611 Stiffness of right shoulder, not elsewhere classified: Secondary | ICD-10-CM

## 2018-12-23 DIAGNOSIS — M6281 Muscle weakness (generalized): Secondary | ICD-10-CM | POA: Diagnosis not present

## 2018-12-23 DIAGNOSIS — M25612 Stiffness of left shoulder, not elsewhere classified: Secondary | ICD-10-CM | POA: Diagnosis not present

## 2018-12-23 NOTE — Patient Instructions (Signed)
CHEST: Doorway, Unilateral - Standing    Standing in doorway, place RIGHT hand on wall with elbow bent at shoulder height. UNLIKE THE PICTURE, TURN YOUR TRUNK TO THE LEFT AS YOU KEEP YOUR FEET PLANTED. Hold _5__ seconds. _5__ reps per set, __2_ sets per day, _7_ days per week  Copyright  VHI. All rights reserved.  CHEST: Doorway, Bilateral - Standing    Standing in doorway, place hands on wall with elbows bent at shoulder height. Lean forward. Hold _10__ seconds. _3__ reps per set, __2_ sets per day, _7__ days per week  Copyright  VHI. All rights reserved.

## 2018-12-23 NOTE — Therapy (Signed)
[place], [place], [address] Phone: [phone]   Fax:  [phone]  Physical Therapy Treatment  Patient Details  Name: FLORENA KOZMA MRN: [id number] Date of Birth: 1976-06-01 Referring Provider (PT): [place]    Encounter Date: 12/23/2018  PT End of Session - 12/23/18 1649    Visit Number  3    Number of Visits  9    Date for PT Re-Evaluation  01/20/19    PT Start Time  1528    PT Stop Time  1625    PT Time Calculation (min)  57 min    Activity Tolerance  Patient tolerated treatment well    Behavior During Therapy  [facility] for tasks assessed/performed       Past Medical History:  Diagnosis Date  . Abnormal Pap smear    ASCUS 03/13/11  . Anemia   . Breast pain, left   . Cancer ([place])   . Candida vaginitis   . Complication of anesthesia    pt states that all narcotics make her angry and she would prefer to avoid them.  . Rectal itching   . Vaginal Pap smear, abnormal     Past Surgical History:  Procedure Laterality Date  . BREAST BIOPSY Right 8/16  . CESAREAN SECTION N/A 11/21/2013   Procedure: CESAREAN SECTION;  Surgeon: Alwyn Pea, MD;  Location: [facility];  Service: Obstetrics;  Laterality: N/A;  . CESAREAN SECTION N/A 06/23/2016   Procedure: CESAREAN SECTION;  Surgeon: Emily Filbert, MD;  Location: [place];  Service: Obstetrics;  Laterality: N/A;  . HAND SURGERY  04/2012   ligament repair  . MASTECTOMY WITH AXILLARY LYMPH NODE DISSECTION Bilateral 11/14/2018   Procedure: RIGHT MASTECTOMY WITH LEFT AXILLARY LYMPH NODE DISSECTION AND LEFT RISK REDUCING MASTECTOMY;  Surgeon: Rolm Bookbinder, MD;  Location: [place];  Service: General;  Laterality: Bilateral;  . PILONIDAL CYST EXCISION  2000  . PORTACATH PLACEMENT N/A 05/28/2018   Procedure: INSERTION PORT-A-CATH WITH ULTRASOUND;  Surgeon: Rolm Bookbinder, MD;  Location: [place];   Service: General;  Laterality: N/A;  . WISDOM TOOTH EXTRACTION  AGE 8    There were no vitals filed for this visit.  Subjective Assessment - 12/23/18 1535    Subjective  Noticing cording more in my right forearm compared to upper arm. I feel more tired since last session. ROM is greatly improved.    Pertinent History  breast cancer diagnosed November 2019: triple negative with Ki of 75% She completed chemotherapy 10/18/2018. 11/14/2018: she underwent bilateral mastectomy with 0/13 nodes positive on the right    Patient Stated Goals  to learn what she needs to know for exercises post op and to hopefully avoid lymphedema     Currently in Pain?  Yes    Pain Score  2     Pain Location  Axilla    Pain Orientation  Right    Pain Descriptors / Indicators  Sharp    Pain Type  Surgical pain                       [facility] Adult PT Treatment/Exercise - 12/23/18 0001      Manual Therapy   Manual Therapy  Edema management;Manual Lymphatic Drainage (MLD);Passive ROM;Neural Stretch;Myofascial release    Myofascial Release  To right medial upper arm and axilla in supine to pt tolerance    Manual Lymphatic Drainage (MLD)  In supine: short  neck, left axillary and right inguinal nodes; anterior inter-axillary pathway and right axillo-inguinal pathway. Right UE from dorsal hand to shoulder redirecting along pathways.    Passive ROM  Passive ROM right shoulder focused on flexion and abduction with ER for prep for radiation.    Neural Stretch  To right UE in supine to pt tolerance; unable to extend hand much due to pain             PT Education - 12/23/18 1649    Education Details  Doorway stretches for pecs    Person(s) Educated  Patient    Methods  Explanation;Demonstration;Handout    Comprehension  Returned demonstration;Verbalized understanding          PT Long Term Goals - 12/18/18 1733      PT LONG TERM GOAL #1   Title  Pt will have 150 degrees of painfree right shoulder  abduction so that she can perform her household chores easier    Baseline  108 with pain    Time  4    Period  Weeks    Status  New      PT LONG TERM GOAL #2   Title  Pt will report 50% decrease in right shoulder tightness and pulling from axillary cording    Time  4    Period  Weeks    Status  New      PT LONG TERM GOAL #3   Title  Pt will be independent in a home exercise program for shoulder ROM and strength    Time  4    Period  Weeks    Status  New            Plan - 12/23/18 1650    Clinical Impression Statement  Patieitn with visible cording present at beginning of session but it had reduced significantly after manual therapy. She had pain at end ROM but was able to breathe and tolerated treatment. She reported feeling looser after treatment.    PT Frequency  2x / week    PT Duration  4 weeks    PT Treatment/Interventions  ADLs/Self Care Home Management;Therapeutic exercise;Manual lymph drainage;Manual techniques    PT Next Visit Plan  MLD, manual work for axillary cording, progress exercises.    PT Home Exercise Plan  Doorway stretches    Consulted and Agree with Plan of Care  Patient       Patient will benefit from skilled therapeutic intervention in order to improve the following deficits and impairments:  Decreased knowledge of precautions, Decreased knowledge of use of DME, Postural dysfunction, Decreased range of motion, Increased fascial restricitons, Impaired UE functional use, Increased muscle spasms, Impaired perceived functional ability, Pain, Decreased scar mobility, Decreased strength, Increased edema  Visit Diagnosis: 1. Aftercare following surgery for neoplasm   2. Stiffness of right shoulder joint   3. Acute pain of right shoulder   4. Abnormal posture   5. Muscle weakness (generalized)        Problem List Patient Active Problem List   Diagnosis Date Noted  . History of right breast cancer 11/14/2018  . Chemotherapy induced neutropenia ([place])  09/13/2018  . Port-A-Cath in place 06/10/2018  . Malignant neoplasm of upper-outer quadrant of right breast in female, estrogen receptor negative ([place]) 05/09/2018  . Low iron stores 01/16/2018  . Muscle spasm 01/15/2018  . Piriformis syndrome of left side 01/15/2018  . Grade I hemorrhoids 02/23/2017  . Low lying posterior placenta, antepartum 02/01/2016  .  AMA (advanced maternal age) multigravida 35+ 11/22/2015  . 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 Calcification of right breast 02/19/2015  . Ligament tear 07/05/2012   Annia Friendly, PT 12/23/18 4:56 PM  [place], [place], [address] Phone: [phone]   Fax:  [phone]  Name: JOSCELIN FRAY MRN: [id number] Date of Birth: Sep 12, 1975

## 2018-12-24 ENCOUNTER — Ambulatory Visit: Payer: BC Managed Care – PPO

## 2018-12-25 ENCOUNTER — Ambulatory Visit: Payer: BC Managed Care – PPO

## 2018-12-25 ENCOUNTER — Ambulatory Visit: Payer: BC Managed Care – PPO | Admitting: Physical Therapy

## 2018-12-26 ENCOUNTER — Ambulatory Visit: Payer: BC Managed Care – PPO | Admitting: Physical Therapy

## 2018-12-26 ENCOUNTER — Ambulatory Visit: Payer: BC Managed Care – PPO

## 2018-12-27 ENCOUNTER — Ambulatory Visit: Payer: BC Managed Care – PPO

## 2018-12-27 ENCOUNTER — Other Ambulatory Visit: Payer: Self-pay

## 2018-12-27 DIAGNOSIS — M25511 Pain in right shoulder: Secondary | ICD-10-CM

## 2018-12-27 DIAGNOSIS — M6281 Muscle weakness (generalized): Secondary | ICD-10-CM | POA: Diagnosis not present

## 2018-12-27 DIAGNOSIS — M25611 Stiffness of right shoulder, not elsewhere classified: Secondary | ICD-10-CM

## 2018-12-27 DIAGNOSIS — R293 Abnormal posture: Secondary | ICD-10-CM | POA: Diagnosis not present

## 2018-12-27 DIAGNOSIS — Z483 Aftercare following surgery for neoplasm: Secondary | ICD-10-CM

## 2018-12-27 DIAGNOSIS — M25612 Stiffness of left shoulder, not elsewhere classified: Secondary | ICD-10-CM | POA: Diagnosis not present

## 2018-12-27 NOTE — Therapy (Signed)
[place], [place], [address] Phone: [phone]   Fax:  [phone]  Physical Therapy Treatment  Patient Details  Name: Darlene Grant MRN: [id number] Date of Birth: November 25, 1975 Referring Provider (PT): [place]    Encounter Date: 12/27/2018  PT End of Session - 12/27/18 1058    Visit Number  4    Number of Visits  9    Date for PT Re-Evaluation  01/20/19    PT Start Time  1601    PT Stop Time  1052    PT Time Calculation (min)  50 min    Activity Tolerance  Patient tolerated treatment well    Behavior During Therapy  [facility] for tasks assessed/performed       Past Medical History:  Diagnosis Date  . Abnormal Pap smear    ASCUS 03/13/11  . Anemia   . Breast pain, left   . Cancer ([place])   . Candida vaginitis   . Complication of anesthesia    pt states that all narcotics make her angry and she would prefer to avoid them.  . Rectal itching   . Vaginal Pap smear, abnormal     Past Surgical History:  Procedure Laterality Date  . BREAST BIOPSY Right 8/16  . CESAREAN SECTION N/A 11/21/2013   Procedure: CESAREAN SECTION;  Surgeon: Alwyn Pea, MD;  Location: [facility];  Service: Obstetrics;  Laterality: N/A;  . CESAREAN SECTION N/A 06/23/2016   Procedure: CESAREAN SECTION;  Surgeon: Emily Filbert, MD;  Location: [place];  Service: Obstetrics;  Laterality: N/A;  . HAND SURGERY  04/2012   ligament repair  . MASTECTOMY WITH AXILLARY LYMPH NODE DISSECTION Bilateral 11/14/2018   Procedure: RIGHT MASTECTOMY WITH LEFT AXILLARY LYMPH NODE DISSECTION AND LEFT RISK REDUCING MASTECTOMY;  Surgeon: Rolm Bookbinder, MD;  Location: [place];  Service: General;  Laterality: Bilateral;  . PILONIDAL CYST EXCISION  2000  . PORTACATH PLACEMENT N/A 05/28/2018   Procedure: INSERTION PORT-A-CATH WITH ULTRASOUND;  Surgeon: Rolm Bookbinder, MD;  Location: [place];   Service: General;  Laterality: N/A;  . WISDOM TOOTH EXTRACTION  AGE 73    There were no vitals filed for this visit.  Subjective Assessment - 12/27/18 1006    Subjective  The cording is improving already and I could tell a difference after my last appointment, the tightness had eased off some.    Pertinent History  breast cancer diagnosed November 2019: triple negative with Ki of 75% She completed chemotherapy 10/18/2018. 11/14/2018: she underwent bilateral mastectomy with 0/13 nodes positive on the right    Patient Stated Goals  to learn what she needs to know for exercises post op and to hopefully avoid lymphedema     Currently in Pain?  No/denies                       [facility] Adult PT Treatment/Exercise - 12/27/18 0001      Shoulder Exercises: Pulleys   Flexion  1 minute    Flexion Limitations  Pt returned therapist demo    ABduction  2 minutes    ABduction Limitations  Pt returned therapist demo and brief VCs to decrease scapualr compensation      Shoulder Exercises: Therapy Ball   Flexion  Both;10 reps   forward lean into end of stretch   Flexion Limitations  pt returned therapist demo and demonstrated good technique    ABduction  Right;5 reps    ABduction Limitations  returning therapist demo      Manual Therapy   Myofascial Release  To right medial upper arm and axilla in supine to pt tolerance    Manual Lymphatic Drainage (MLD)  In supine: short neck, left axillary and right inguinal nodes; anterior inter-axillary pathway and right axillo-inguinal pathway. Right UE from dorsal hand to shoulder redirecting along pathways.    Passive ROM  Passive ROM right shoulder focused on flexion and abduction with ER for prep for radiation.    Neural Stretch  To right UE in supine to pt tolerance; this was improved today                  PT Long Term Goals - 12/18/18 1733      PT LONG TERM GOAL #1   Title  Pt will have 150 degrees of painfree right shoulder  abduction so that she can perform her household chores easier    Baseline  108 with pain    Time  4    Period  Weeks    Status  New      PT LONG TERM GOAL #2   Title  Pt will report 50% decrease in right shoulder tightness and pulling from axillary cording    Time  4    Period  Weeks    Status  New      PT LONG TERM GOAL #3   Title  Pt will be independent in a home exercise program for shoulder ROM and strength    Time  4    Period  Weeks    Status  New            Plan - 12/27/18 1058    Clinical Impression Statement  Pt with much improvement from last session. Cording no longer visible except just still very tight at end ROM, especially at Rt axilla. Her P/ROM was much improved by end of session. Progressed her to include pulleys and roll ball up wall for AA/ROM exercises as well which she tolerated very well and with no c/o pain or discomfort.    Personal Factors and Comorbidities  Comorbidity 1    Comorbidities  recent chemo    Examination-Activity Limitations  Bathing;Reach Overhead;Caring for Others;Lift;Hygiene/Grooming    Stability/Clinical Decision Making  Evolving/Moderate complexity    Rehab Potential  Good    PT Frequency  2x / week    PT Duration  4 weeks    PT Treatment/Interventions  ADLs/Self Care Home Management;Therapeutic exercise;Manual lymph drainage;Manual techniques    PT Next Visit Plan  Cont MLD, manual work for axillary cording, instruct in supine scapular series next    Consulted and Agree with Plan of Care  Patient       Patient will benefit from skilled therapeutic intervention in order to improve the following deficits and impairments:  Decreased knowledge of precautions, Decreased knowledge of use of DME, Postural dysfunction, Decreased range of motion, Increased fascial restricitons, Impaired UE functional use, Increased muscle spasms, Impaired perceived functional ability, Pain, Decreased scar mobility, Decreased strength, Increased  edema  Visit Diagnosis: 1. Aftercare following surgery for neoplasm   2. Stiffness of right shoulder joint   3. Acute pain of right shoulder   4. Abnormal posture   5. Muscle weakness (generalized)        Problem List Patient Active Problem List   Diagnosis Date Noted  . History of right breast cancer 11/14/2018  . Chemotherapy  induced neutropenia ([place]) 09/13/2018  . Port-A-Cath in place 06/10/2018  . Malignant neoplasm of upper-outer quadrant of right breast in female, estrogen receptor negative ([place]) 05/09/2018  . Low iron stores 01/16/2018  . Muscle spasm 01/15/2018  . Piriformis syndrome of left side 01/15/2018  . Grade I hemorrhoids 02/23/2017  . Low lying posterior placenta, antepartum 02/01/2016  . AMA (advanced maternal age) multigravida 35+ 11/22/2015  . 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 Calcification of right breast 02/19/2015  . Ligament tear 07/05/2012    Otelia Limes, PTA 12/27/2018, 11:01 AM  [place], [place], [address] Phone: [phone]   Fax:  [phone]  Name: Darlene Grant MRN: [id number] Date of Birth: 1976/05/07

## 2018-12-30 ENCOUNTER — Ambulatory Visit
Admission: RE | Admit: 2018-12-30 | Discharge: 2018-12-30 | Disposition: A | Discharge status: Home / Self Care | Payer: BC Managed Care – PPO | Source: Ambulatory Visit | Attending: Radiation Oncology | Admitting: Radiation Oncology

## 2018-12-30 ENCOUNTER — Other Ambulatory Visit: Payer: Self-pay

## 2018-12-30 ENCOUNTER — Ambulatory Visit: Payer: BC Managed Care – PPO

## 2018-12-30 DIAGNOSIS — Z171 Estrogen receptor negative status [ER-]: Secondary | ICD-10-CM

## 2018-12-30 DIAGNOSIS — Z9221 Personal history of antineoplastic chemotherapy: Secondary | ICD-10-CM | POA: Diagnosis not present

## 2018-12-30 DIAGNOSIS — C50411 Malignant neoplasm of upper-outer quadrant of right female breast: Secondary | ICD-10-CM | POA: Diagnosis not present

## 2018-12-30 DIAGNOSIS — Z510 Encounter for antineoplastic radiation therapy: Secondary | ICD-10-CM | POA: Diagnosis not present

## 2018-12-30 DIAGNOSIS — Z9013 Acquired absence of bilateral breasts and nipples: Secondary | ICD-10-CM | POA: Diagnosis not present

## 2018-12-30 MED ORDER — RADIAPLEXRX EX GEL
Freq: Once | CUTANEOUS | Status: AC
  Administered 2018-12-30: 17:00:00 via TOPICAL

## 2018-12-30 MED ORDER — ALRA NON-METALLIC DEODORANT (RAD-ONC)
1.0000 "application " | Freq: Once | TOPICAL | Status: AC
  Administered 2018-12-30: 1 via TOPICAL

## 2018-12-30 NOTE — Progress Notes (Signed)
  Radiation Oncology         [phone] ________________________________  Name: Darlene Grant MRN: [id number]  Date: 12/30/2018  DOB: 1976/04/19  Simulation Verification Note    ICD-10-CM   1. Malignant neoplasm of upper-outer quadrant of right breast in female, estrogen receptor negative (HCC)  C50.411 hyaluronate sodium (RADIAPLEXRX) gel   Y70.6 non-metallic deodorant (ALRA) 1 application    Status: outpatient  NARRATIVE: The patient was brought to the treatment unit and placed in the planned treatment position. The clinical setup was verified. Then port films were obtained and uploaded to the radiation oncology medical record software.  The treatment beams were carefully compared against the planned radiation fields. The position location and shape of the radiation fields was reviewed. They targeted volume of tissue appears to be appropriately covered by the radiation beams. Organs at risk appear to be excluded as planned.  Based on my personal review, I approved the simulation verification. The patient's treatment will proceed as planned.  -----------------------------------  Blair Promise, PhD, MD  This document serves as a record of services personally performed by Gery Pray, MD. It was created on his behalf by Mary-Margaret Loma Messing, a trained medical scribe. The creation of this record is based on the scribe's personal observations and the provider's statements to them. This document has been checked and approved by the attending provider.

## 2018-12-31 ENCOUNTER — Encounter: Payer: Self-pay | Admitting: Physical Therapy

## 2018-12-31 ENCOUNTER — Ambulatory Visit
Admission: RE | Admit: 2018-12-31 | Discharge: 2018-12-31 | Disposition: A | Discharge status: Home / Self Care | Payer: BC Managed Care – PPO | Source: Ambulatory Visit | Attending: Radiation Oncology | Admitting: Radiation Oncology

## 2018-12-31 ENCOUNTER — Ambulatory Visit: Payer: BC Managed Care – PPO

## 2018-12-31 ENCOUNTER — Other Ambulatory Visit: Payer: Self-pay

## 2018-12-31 ENCOUNTER — Ambulatory Visit: Payer: BC Managed Care – PPO | Admitting: Physical Therapy

## 2018-12-31 DIAGNOSIS — M25612 Stiffness of left shoulder, not elsewhere classified: Secondary | ICD-10-CM | POA: Diagnosis not present

## 2018-12-31 DIAGNOSIS — Z9221 Personal history of antineoplastic chemotherapy: Secondary | ICD-10-CM | POA: Diagnosis not present

## 2018-12-31 DIAGNOSIS — Z171 Estrogen receptor negative status [ER-]: Secondary | ICD-10-CM

## 2018-12-31 DIAGNOSIS — Z483 Aftercare following surgery for neoplasm: Secondary | ICD-10-CM

## 2018-12-31 DIAGNOSIS — C50411 Malignant neoplasm of upper-outer quadrant of right female breast: Secondary | ICD-10-CM | POA: Diagnosis not present

## 2018-12-31 DIAGNOSIS — Z9013 Acquired absence of bilateral breasts and nipples: Secondary | ICD-10-CM | POA: Diagnosis not present

## 2018-12-31 DIAGNOSIS — R293 Abnormal posture: Secondary | ICD-10-CM

## 2018-12-31 DIAGNOSIS — M6281 Muscle weakness (generalized): Secondary | ICD-10-CM

## 2018-12-31 DIAGNOSIS — M25611 Stiffness of right shoulder, not elsewhere classified: Secondary | ICD-10-CM | POA: Diagnosis not present

## 2018-12-31 DIAGNOSIS — M25511 Pain in right shoulder: Secondary | ICD-10-CM

## 2018-12-31 DIAGNOSIS — Z510 Encounter for antineoplastic radiation therapy: Secondary | ICD-10-CM | POA: Diagnosis not present

## 2018-12-31 MED ORDER — SONAFINE EX EMUL
1.0000 "application " | Freq: Two times a day (BID) | CUTANEOUS | Status: DC
  Administered 2018-12-31: 1 via TOPICAL

## 2018-12-31 NOTE — Patient Instructions (Signed)

## 2018-12-31 NOTE — Therapy (Signed)
[place], [place], [address] Phone: [phone]   Fax:  [phone]  Physical Therapy Treatment  Patient Details  Name: Darlene Grant MRN: [id number] Date of Birth: 21-Jan-1976 Referring Provider (PT): [place]    Encounter Date: 12/31/2018  PT End of Session - 12/31/18 1020    Visit Number  5    Number of Visits  9    Date for PT Re-Evaluation  01/20/19    PT Start Time  0934    PT Stop Time  1017    PT Time Calculation (min)  43 min    Activity Tolerance  Patient tolerated treatment well    Behavior During Therapy  [facility] for tasks assessed/performed       Past Medical History:  Diagnosis Date  . Abnormal Pap smear    ASCUS 03/13/11  . Anemia   . Breast pain, left   . Cancer ([place])   . Candida vaginitis   . Complication of anesthesia    pt states that all narcotics make her angry and she would prefer to avoid them.  . Rectal itching   . Vaginal Pap smear, abnormal     Past Surgical History:  Procedure Laterality Date  . BREAST BIOPSY Right 8/16  . CESAREAN SECTION N/A 11/21/2013   Procedure: CESAREAN SECTION;  Surgeon: Alwyn Pea, MD;  Location: [facility];  Service: Obstetrics;  Laterality: N/A;  . CESAREAN SECTION N/A 06/23/2016   Procedure: CESAREAN SECTION;  Surgeon: Emily Filbert, MD;  Location: [place];  Service: Obstetrics;  Laterality: N/A;  . HAND SURGERY  04/2012   ligament repair  . MASTECTOMY WITH AXILLARY LYMPH NODE DISSECTION Bilateral 11/14/2018   Procedure: RIGHT MASTECTOMY WITH LEFT AXILLARY LYMPH NODE DISSECTION AND LEFT RISK REDUCING MASTECTOMY;  Surgeon: Rolm Bookbinder, MD;  Location: [place];  Service: General;  Laterality: Bilateral;  . PILONIDAL CYST EXCISION  2000  . PORTACATH PLACEMENT N/A 05/28/2018   Procedure: INSERTION PORT-A-CATH WITH ULTRASOUND;  Surgeon: Rolm Bookbinder, MD;  Location: [place];   Service: General;  Laterality: N/A;  . WISDOM TOOTH EXTRACTION  AGE 37    There were no vitals filed for this visit.  Subjective Assessment - 12/31/18 0936    Subjective  I am still having some tightness. I did my first radiation today.    Pertinent History  breast cancer diagnosed November 2019: triple negative with Ki of 75% She completed chemotherapy 10/18/2018. 11/14/2018: she underwent bilateral mastectomy with 0/13 nodes positive on the right    Patient Stated Goals  to learn what she needs to know for exercises post op and to hopefully avoid lymphedema     Currently in Pain?  No/denies    Pain Score  0-No pain                       [facility] Adult PT Treatment/Exercise - 12/31/18 0001      Shoulder Exercises: Supine   Horizontal ABduction  Strengthening;Both;10 reps;Theraband   pt returned therapist demo   Theraband Level (Shoulder Horizontal ABduction)  Level 1 (Yellow)    External Rotation  Strengthening;Both;10 reps;Theraband   pt returned therapist demo   Theraband Level (Shoulder External Rotation)  Level 1 (Yellow)    Flexion  Strengthening;Both;10 reps;Theraband   narrow and wide grip, pt returned therapist demo   Theraband Level (Shoulder Flexion)  Level 1 (Yellow)    Diagonals  Strengthening;Both;10  reps;Theraband   pt returned therapist demo   Theraband Level (Shoulder Diagonals)  Level 1 (Yellow)      Manual Therapy   Myofascial Release  to cording in right axilla    Passive ROM  to R shoulder in direction of flexion, abduction and D2    Neural Stretch  To right UE in supine to pt tolerance; this was improved today                  PT Long Term Goals - 12/18/18 1733      PT LONG TERM GOAL #1   Title  Pt will have 150 degrees of painfree right shoulder abduction so that she can perform her household chores easier    Baseline  108 with pain    Time  4    Period  Weeks    Status  New      PT LONG TERM GOAL #2   Title  Pt will report  50% decrease in right shoulder tightness and pulling from axillary cording    Time  4    Period  Weeks    Status  New      PT LONG TERM GOAL #3   Title  Pt will be independent in a home exercise program for shoulder ROM and strength    Time  4    Period  Weeks    Status  New            Plan - 12/31/18 1020    Clinical Impression Statement  Pt demonstrates great ROM today but still has tightness from cording. Instructed pt in supine scapular series today using yellow theraband and pt felt the exercises were challenging. She did not have any increased pain or discomfort. Performed myofascial to right axilla to decrease cording.    Rehab Potential  Good    PT Frequency  2x / week    PT Duration  4 weeks    PT Treatment/Interventions  ADLs/Self Care Home Management;Therapeutic exercise;Manual lymph drainage;Manual techniques    PT Next Visit Plan  Cont MLD, manual work for axillary cording, assess indep with supine scapular series next    PT Home Exercise Plan  Doorway stretches, supine scap series    Consulted and Agree with Plan of Care  Patient       Patient will benefit from skilled therapeutic intervention in order to improve the following deficits and impairments:  Decreased knowledge of precautions, Decreased knowledge of use of DME, Postural dysfunction, Decreased range of motion, Increased fascial restricitons, Impaired UE functional use, Increased muscle spasms, Impaired perceived functional ability, Pain, Decreased scar mobility, Decreased strength, Increased edema  Visit Diagnosis: 1. Aftercare following surgery for neoplasm   2. Stiffness of right shoulder joint   3. Acute pain of right shoulder   4. Abnormal posture   5. Muscle weakness (generalized)        Problem List Patient Active Problem List   Diagnosis Date Noted  . History of right breast cancer 11/14/2018  . Chemotherapy induced neutropenia ([place]) 09/13/2018  . Port-A-Cath in place 06/10/2018  .  Malignant neoplasm of upper-outer quadrant of right breast in female, estrogen receptor negative ([place]) 05/09/2018  . Low iron stores 01/16/2018  . Muscle spasm 01/15/2018  . Piriformis syndrome of left side 01/15/2018  . Grade I hemorrhoids 02/23/2017  . Low lying posterior placenta, antepartum 02/01/2016  . AMA (advanced maternal age) multigravida 35+ 11/22/2015  . 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  Calcification of right breast 02/19/2015  . Ligament tear 07/05/2012    Allyson Sabal [facility] 12/31/2018, 10:23 AM  [place], [place], [address] Phone: [phone]   Fax:  [phone]  Name: Darlene Grant MRN: [id number] Date of Birth: 26-Sep-1975  Manus Gunning, PT 12/31/18 10:23 AM

## 2019-01-01 ENCOUNTER — Ambulatory Visit: Payer: BC Managed Care – PPO

## 2019-01-01 ENCOUNTER — Ambulatory Visit
Admission: RE | Admit: 2019-01-01 | Discharge: 2019-01-01 | Disposition: A | Discharge status: Home / Self Care | Payer: BC Managed Care – PPO | Source: Ambulatory Visit | Attending: Radiation Oncology | Admitting: Radiation Oncology

## 2019-01-01 ENCOUNTER — Other Ambulatory Visit: Payer: Self-pay

## 2019-01-01 DIAGNOSIS — Z510 Encounter for antineoplastic radiation therapy: Secondary | ICD-10-CM | POA: Diagnosis not present

## 2019-01-01 DIAGNOSIS — Z9221 Personal history of antineoplastic chemotherapy: Secondary | ICD-10-CM | POA: Diagnosis not present

## 2019-01-01 DIAGNOSIS — Z9013 Acquired absence of bilateral breasts and nipples: Secondary | ICD-10-CM | POA: Diagnosis not present

## 2019-01-01 DIAGNOSIS — C50411 Malignant neoplasm of upper-outer quadrant of right female breast: Secondary | ICD-10-CM | POA: Diagnosis not present

## 2019-01-01 DIAGNOSIS — Z171 Estrogen receptor negative status [ER-]: Secondary | ICD-10-CM | POA: Diagnosis not present

## 2019-01-02 ENCOUNTER — Ambulatory Visit: Payer: BC Managed Care – PPO

## 2019-01-02 ENCOUNTER — Ambulatory Visit
Admission: RE | Admit: 2019-01-02 | Discharge: 2019-01-02 | Disposition: A | Discharge status: Home / Self Care | Payer: BC Managed Care – PPO | Source: Ambulatory Visit | Attending: Radiation Oncology | Admitting: Radiation Oncology

## 2019-01-02 ENCOUNTER — Other Ambulatory Visit: Payer: Self-pay

## 2019-01-02 DIAGNOSIS — M6281 Muscle weakness (generalized): Secondary | ICD-10-CM

## 2019-01-02 DIAGNOSIS — M25511 Pain in right shoulder: Secondary | ICD-10-CM | POA: Diagnosis not present

## 2019-01-02 DIAGNOSIS — Z483 Aftercare following surgery for neoplasm: Secondary | ICD-10-CM | POA: Diagnosis not present

## 2019-01-02 DIAGNOSIS — Z510 Encounter for antineoplastic radiation therapy: Secondary | ICD-10-CM | POA: Diagnosis not present

## 2019-01-02 DIAGNOSIS — Z171 Estrogen receptor negative status [ER-]: Secondary | ICD-10-CM | POA: Diagnosis not present

## 2019-01-02 DIAGNOSIS — C50411 Malignant neoplasm of upper-outer quadrant of right female breast: Secondary | ICD-10-CM | POA: Diagnosis not present

## 2019-01-02 DIAGNOSIS — R293 Abnormal posture: Secondary | ICD-10-CM | POA: Diagnosis not present

## 2019-01-02 DIAGNOSIS — M25611 Stiffness of right shoulder, not elsewhere classified: Secondary | ICD-10-CM

## 2019-01-02 DIAGNOSIS — M25612 Stiffness of left shoulder, not elsewhere classified: Secondary | ICD-10-CM | POA: Diagnosis not present

## 2019-01-02 DIAGNOSIS — Z9221 Personal history of antineoplastic chemotherapy: Secondary | ICD-10-CM | POA: Diagnosis not present

## 2019-01-02 DIAGNOSIS — Z9013 Acquired absence of bilateral breasts and nipples: Secondary | ICD-10-CM | POA: Diagnosis not present

## 2019-01-02 NOTE — Therapy (Signed)
[place], [place], [address] Phone: [phone]   Fax:  [phone]  Physical Therapy Treatment  Patient Details  Name: Darlene Grant MRN: [id number] Date of Birth: February 25, 1976 Referring Provider (PT): [place]    Encounter Date: 01/02/2019  PT End of Session - 01/02/19 0954    Visit Number  6    Number of Visits  9    Date for PT Re-Evaluation  01/20/19    PT Start Time  0908   started late due to pt scheduling more appts   PT Stop Time  0954    PT Time Calculation (min)  46 min    Activity Tolerance  Patient tolerated treatment well    Behavior During Therapy  [facility] for tasks assessed/performed       Past Medical History:  Diagnosis Date  . Abnormal Pap smear    ASCUS 03/13/11  . Anemia   . Breast pain, left   . Cancer ([place])   . Candida vaginitis   . Complication of anesthesia    pt states that all narcotics make her angry and she would prefer to avoid them.  . Rectal itching   . Vaginal Pap smear, abnormal     Past Surgical History:  Procedure Laterality Date  . BREAST BIOPSY Right 8/16  . CESAREAN SECTION N/A 11/21/2013   Procedure: CESAREAN SECTION;  Surgeon: Alwyn Pea, MD;  Location: [place] [facility];  Service: Obstetrics;  Laterality: N/A;  . CESAREAN SECTION N/A 06/23/2016   Procedure: CESAREAN SECTION;  Surgeon: Emily Filbert, MD;  Location: [place];  Service: Obstetrics;  Laterality: N/A;  . HAND SURGERY  04/2012   ligament repair  . MASTECTOMY WITH AXILLARY LYMPH NODE DISSECTION Bilateral 11/14/2018   Procedure: RIGHT MASTECTOMY WITH LEFT AXILLARY LYMPH NODE DISSECTION AND LEFT RISK REDUCING MASTECTOMY;  Surgeon: Rolm Bookbinder, MD;  Location: [place];  Service: General;  Laterality: Bilateral;  . PILONIDAL CYST EXCISION  2000  . PORTACATH PLACEMENT N/A 05/28/2018   Procedure: INSERTION PORT-A-CATH WITH ULTRASOUND;  Surgeon: Rolm Bookbinder, MD;  Location: [place];  Service: General;  Laterality: N/A;  . WISDOM TOOTH EXTRACTION  AGE 35    There were no vitals filed for this visit.  Subjective Assessment - 01/02/19 0910    Subjective  I've been doing the supine scapular series she gave me but theraband always irritates my neck so I switched to holding soup cans and that's helped some. My skin is starting to get red, but still doing okay.    Pertinent History  breast cancer diagnosed November 2019: triple negative with Ki of 75% She completed chemotherapy 10/18/2018. 11/14/2018: she underwent bilateral mastectomy with 0/13 nodes positive on the right    Patient Stated Goals  to learn what she needs to know for exercises post op and to hopefully avoid lymphedema     Currently in Pain?  No/denies                       [facility] Adult PT Treatment/Exercise - 01/02/19 0001      Manual Therapy   Myofascial Release  to cording in right axilla but not at chest due to redness beginning from radiation    Passive ROM  to Rt shoulder in direction of flexion, abduction and D2    Neural Stretch  To right UE in supine to pt tolerance; this was improved today  PT Education - 01/02/19 0958    Education Details  Verbally instructed pt to try supine over towel roll along T-spine for bil abduction into "snow angels"    Person(s) Educated  Patient    Methods  Explanation;Demonstration    Comprehension  Verbalized understanding          PT Long Term Goals - 12/18/18 1733      PT LONG TERM GOAL #1   Title  Pt will have 150 degrees of painfree right shoulder abduction so that she can perform her household chores easier    Baseline  108 with pain    Time  4    Period  Weeks    Status  New      PT LONG TERM GOAL #2   Title  Pt will report 50% decrease in right shoulder tightness and pulling from axillary cording    Time  4    Period  Weeks    Status  New      PT LONG TERM GOAL #3    Title  Pt will be independent in a home exercise program for shoulder ROM and strength    Time  4    Period  Weeks    Status  New            Plan - 01/02/19 0955    Clinical Impression Statement  Pt did not have any questions reagrding supine scapular series so focused on manual therapy today working to imporve end ROM. Avoided irradiated tissue of Rt chest wall as pt reports this is beginning to become red. She tolerates this well and reports good benefit after session by loosening of tissue and improved end Rt shoulder Rt ROM.    Personal Factors and Comorbidities  Comorbidity 1    Comorbidities  recent chemo    Examination-Activity Limitations  Bathing;Reach Overhead;Caring for Others;Lift;Hygiene/Grooming    Stability/Clinical Decision Making  Evolving/Moderate complexity    Rehab Potential  Good    PT Duration  4 weeks    PT Treatment/Interventions  ADLs/Self Care Home Management;Therapeutic exercise;Manual lymph drainage;Manual techniques    PT Next Visit Plan  Remeasure A/ROM, Cont MLD prn, manual work for axillary cording,    PT Home Exercise Plan  Doorway stretches, supine scap series; supine over towel roll along T-spine for bil abduction    Consulted and Agree with Plan of Care  Patient       Patient will benefit from skilled therapeutic intervention in order to improve the following deficits and impairments:  Decreased knowledge of precautions, Decreased knowledge of use of DME, Postural dysfunction, Decreased range of motion, Increased fascial restricitons, Impaired UE functional use, Increased muscle spasms, Impaired perceived functional ability, Pain, Decreased scar mobility, Decreased strength, Increased edema  Visit Diagnosis: 1. Aftercare following surgery for neoplasm   2. Stiffness of right shoulder joint   3. Acute pain of right shoulder   4. Abnormal posture   5. Muscle weakness (generalized)        Problem List Patient Active Problem List    Diagnosis Date Noted  . History of right breast cancer 11/14/2018  . Chemotherapy induced neutropenia ([place]) 09/13/2018  . Port-A-Cath in place 06/10/2018  . Malignant neoplasm of upper-outer quadrant of right breast in female, estrogen receptor negative ([place]) 05/09/2018  . Low iron stores 01/16/2018  . Muscle spasm 01/15/2018  . Piriformis syndrome of left side 01/15/2018  . Grade I hemorrhoids 02/23/2017  . Low lying posterior placenta, antepartum 02/01/2016  .  AMA (advanced maternal age) multigravida 35+ 11/22/2015  . 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 Calcification of right breast 02/19/2015  . Ligament tear 07/05/2012    Otelia Limes, PTA 01/02/2019, 9:59 AM  [place], [place], [address] Phone: [phone]   Fax:  [phone]  Name: Darlene Grant MRN: [id number] Date of Birth: 1975-12-12

## 2019-01-03 ENCOUNTER — Ambulatory Visit: Payer: BC Managed Care – PPO

## 2019-01-03 ENCOUNTER — Other Ambulatory Visit: Payer: Self-pay

## 2019-01-03 ENCOUNTER — Ambulatory Visit
Admission: RE | Admit: 2019-01-03 | Discharge: 2019-01-03 | Disposition: A | Discharge status: Home / Self Care | Payer: BC Managed Care – PPO | Source: Ambulatory Visit | Attending: Radiation Oncology | Admitting: Radiation Oncology

## 2019-01-03 DIAGNOSIS — Z9013 Acquired absence of bilateral breasts and nipples: Secondary | ICD-10-CM | POA: Diagnosis not present

## 2019-01-03 DIAGNOSIS — Z9221 Personal history of antineoplastic chemotherapy: Secondary | ICD-10-CM | POA: Diagnosis not present

## 2019-01-03 DIAGNOSIS — Z510 Encounter for antineoplastic radiation therapy: Secondary | ICD-10-CM | POA: Diagnosis not present

## 2019-01-03 DIAGNOSIS — Z171 Estrogen receptor negative status [ER-]: Secondary | ICD-10-CM | POA: Diagnosis not present

## 2019-01-03 DIAGNOSIS — C50411 Malignant neoplasm of upper-outer quadrant of right female breast: Secondary | ICD-10-CM | POA: Diagnosis not present

## 2019-01-06 ENCOUNTER — Ambulatory Visit
Admission: RE | Admit: 2019-01-06 | Discharge: 2019-01-06 | Disposition: A | Discharge status: Home / Self Care | Payer: BC Managed Care – PPO | Source: Ambulatory Visit | Attending: Radiation Oncology | Admitting: Radiation Oncology

## 2019-01-06 ENCOUNTER — Other Ambulatory Visit: Payer: Self-pay

## 2019-01-06 ENCOUNTER — Ambulatory Visit: Payer: BC Managed Care – PPO

## 2019-01-06 DIAGNOSIS — Z9221 Personal history of antineoplastic chemotherapy: Secondary | ICD-10-CM | POA: Diagnosis not present

## 2019-01-06 DIAGNOSIS — Z510 Encounter for antineoplastic radiation therapy: Secondary | ICD-10-CM | POA: Diagnosis not present

## 2019-01-06 DIAGNOSIS — C50411 Malignant neoplasm of upper-outer quadrant of right female breast: Secondary | ICD-10-CM | POA: Diagnosis not present

## 2019-01-06 DIAGNOSIS — Z9013 Acquired absence of bilateral breasts and nipples: Secondary | ICD-10-CM | POA: Diagnosis not present

## 2019-01-06 DIAGNOSIS — Z171 Estrogen receptor negative status [ER-]: Secondary | ICD-10-CM | POA: Diagnosis not present

## 2019-01-07 ENCOUNTER — Encounter: Payer: Self-pay | Admitting: Physical Therapy

## 2019-01-07 ENCOUNTER — Ambulatory Visit: Payer: BC Managed Care – PPO

## 2019-01-07 ENCOUNTER — Ambulatory Visit: Payer: BC Managed Care – PPO | Admitting: Physical Therapy

## 2019-01-07 ENCOUNTER — Other Ambulatory Visit: Payer: Self-pay

## 2019-01-07 ENCOUNTER — Ambulatory Visit
Admission: RE | Admit: 2019-01-07 | Discharge: 2019-01-07 | Disposition: A | Discharge status: Home / Self Care | Payer: BC Managed Care – PPO | Source: Ambulatory Visit | Attending: Radiation Oncology | Admitting: Radiation Oncology

## 2019-01-07 DIAGNOSIS — M25611 Stiffness of right shoulder, not elsewhere classified: Secondary | ICD-10-CM

## 2019-01-07 DIAGNOSIS — M6281 Muscle weakness (generalized): Secondary | ICD-10-CM | POA: Diagnosis not present

## 2019-01-07 DIAGNOSIS — M25612 Stiffness of left shoulder, not elsewhere classified: Secondary | ICD-10-CM | POA: Diagnosis not present

## 2019-01-07 DIAGNOSIS — M25511 Pain in right shoulder: Secondary | ICD-10-CM | POA: Diagnosis not present

## 2019-01-07 DIAGNOSIS — Z9013 Acquired absence of bilateral breasts and nipples: Secondary | ICD-10-CM | POA: Diagnosis not present

## 2019-01-07 DIAGNOSIS — Z510 Encounter for antineoplastic radiation therapy: Secondary | ICD-10-CM | POA: Diagnosis not present

## 2019-01-07 DIAGNOSIS — R293 Abnormal posture: Secondary | ICD-10-CM

## 2019-01-07 DIAGNOSIS — Z483 Aftercare following surgery for neoplasm: Secondary | ICD-10-CM | POA: Diagnosis not present

## 2019-01-07 DIAGNOSIS — Z171 Estrogen receptor negative status [ER-]: Secondary | ICD-10-CM | POA: Diagnosis not present

## 2019-01-07 DIAGNOSIS — Z9221 Personal history of antineoplastic chemotherapy: Secondary | ICD-10-CM | POA: Diagnosis not present

## 2019-01-07 DIAGNOSIS — C50411 Malignant neoplasm of upper-outer quadrant of right female breast: Secondary | ICD-10-CM | POA: Diagnosis not present

## 2019-01-07 NOTE — Therapy (Signed)
[place], [place], [address] Phone: [phone]   Fax:  [phone]  Physical Therapy Treatment  Patient Details  Name: Darlene Grant MRN: [id number] Date of Birth: August 31, 1975 Referring Provider (PT): [place]    Encounter Date: 01/07/2019  PT End of Session - 01/07/19 1023    Visit Number  7    Number of Visits  9    Date for PT Re-Evaluation  01/20/19    PT Start Time  0932    PT Stop Time  1015    PT Time Calculation (min)  43 min    Activity Tolerance  Patient tolerated treatment well    Behavior During Therapy  [facility] for tasks assessed/performed       Past Medical History:  Diagnosis Date  . Abnormal Pap smear    ASCUS 03/13/11  . Anemia   . Breast pain, left   . Cancer ([place])   . Candida vaginitis   . Complication of anesthesia    pt states that all narcotics make her angry and she would prefer to avoid them.  . Rectal itching   . Vaginal Pap smear, abnormal     Past Surgical History:  Procedure Laterality Date  . BREAST BIOPSY Right 8/16  . CESAREAN SECTION N/A 11/21/2013   Procedure: CESAREAN SECTION;  Surgeon: Alwyn Pea, MD;  Location: [place] [facility];  Service: Obstetrics;  Laterality: N/A;  . CESAREAN SECTION N/A 06/23/2016   Procedure: CESAREAN SECTION;  Surgeon: Emily Filbert, MD;  Location: [place];  Service: Obstetrics;  Laterality: N/A;  . HAND SURGERY  04/2012   ligament repair  . MASTECTOMY WITH AXILLARY LYMPH NODE DISSECTION Bilateral 11/14/2018   Procedure: RIGHT MASTECTOMY WITH LEFT AXILLARY LYMPH NODE DISSECTION AND LEFT RISK REDUCING MASTECTOMY;  Surgeon: Rolm Bookbinder, MD;  Location: [place];  Service: General;  Laterality: Bilateral;  . PILONIDAL CYST EXCISION  2000  . PORTACATH PLACEMENT N/A 05/28/2018   Procedure: INSERTION PORT-A-CATH WITH ULTRASOUND;  Surgeon: Rolm Bookbinder, MD;  Location: [place];   Service: General;  Laterality: N/A;  . WISDOM TOOTH EXTRACTION  AGE 43    There were no vitals filed for this visit.  Subjective Assessment - 01/07/19 0933    Subjective  My shoulder is really tight. I am doing the exercises to stretch my pec on both sides. I have been lying over a rolled up hand towel. I am doing all the exercises in standing.    Pertinent History  breast cancer diagnosed November 2019: triple negative with Ki of 75% She completed chemotherapy 10/18/2018. 11/14/2018: she underwent bilateral mastectomy with 0/13 nodes positive on the right    Patient Stated Goals  to learn what she needs to know for exercises post op and to hopefully avoid lymphedema     Currently in Pain?  No/denies    Pain Score  0-No pain         [facility] PT Assessment - 01/07/19 0001      AROM   Right Shoulder Flexion  163 Degrees    Right Shoulder ABduction  165 Degrees                   [facility] Adult PT Treatment/Exercise - 01/07/19 0001      Manual Therapy   Myofascial Release  to cording in R axilla extending in to chest where pt has increased tightness. No redness today and pt did not have  any discomfort with this.     Passive ROM  to Rt shoulder in direction of flexion, abduction and D2                  PT Long Term Goals - 12/18/18 1733      PT LONG TERM GOAL #1   Title  Pt will have 150 degrees of painfree right shoulder abduction so that she can perform her household chores easier    Baseline  108 with pain    Time  4    Period  Weeks    Status  New      PT LONG TERM GOAL #2   Title  Pt will report 50% decrease in right shoulder tightness and pulling from axillary cording    Time  4    Period  Weeks    Status  New      PT LONG TERM GOAL #3   Title  Pt will be independent in a home exercise program for shoulder ROM and strength    Time  4    Period  Weeks    Status  New            Plan - 01/07/19 1023    Clinical Impression Statement  Remeasured  AROM at beginning of session today. Pt demonstrates gains in AROM since last time it was measured. She is still having a lot of tightness in her right pec. She reports being unable to tolerate foam roll stretch at this point but has been lying over a rolled up towel with more success. Focused on decreased cording in right axilla into chest where pt feels tightness. Her skin was not red today and pt did not have any discomfort. She reported feeling loosening of tissue by end of session.    Stability/Clinical Decision Making  Evolving/Moderate complexity    Rehab Potential  Good    PT Frequency  2x / week    PT Duration  4 weeks    PT Treatment/Interventions  ADLs/Self Care Home Management;Therapeutic exercise;Manual lymph drainage;Manual techniques    PT Next Visit Plan  PROM to R shoulder at end range, Cont MLD prn, manual work for axillary cording,    PT Home Exercise Plan  Doorway stretches, supine scap series; supine over towel roll along T-spine for bil abduction    Consulted and Agree with Plan of Care  Patient       Patient will benefit from skilled therapeutic intervention in order to improve the following deficits and impairments:  Decreased knowledge of precautions, Decreased knowledge of use of DME, Postural dysfunction, Decreased range of motion, Increased fascial restricitons, Impaired UE functional use, Increased muscle spasms, Impaired perceived functional ability, Pain, Decreased scar mobility, Decreased strength, Increased edema  Visit Diagnosis: 1. Stiffness of right shoulder joint   2. Acute pain of right shoulder   3. Aftercare following surgery for neoplasm   4. Abnormal posture        Problem List Patient Active Problem List   Diagnosis Date Noted  . History of right breast cancer 11/14/2018  . Chemotherapy induced neutropenia ([place]) 09/13/2018  . Port-A-Cath in place 06/10/2018  . Malignant neoplasm of upper-outer quadrant of right breast in female, estrogen  receptor negative ([place]) 05/09/2018  . Low iron stores 01/16/2018  . Muscle spasm 01/15/2018  . Piriformis syndrome of left side 01/15/2018  . Grade I hemorrhoids 02/23/2017  . Low lying posterior placenta, antepartum 02/01/2016  . AMA (advanced maternal age)  multigravida 35+ 11/22/2015  . 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 Calcification of right breast 02/19/2015  . Ligament tear 07/05/2012    Allyson Sabal [facility] 01/07/2019, 10:26 AM  [place], [place], [address] Phone: [phone]   Fax:  [phone]  Name: TANIAYA RUDDER MRN: [id number] Date of Birth: [birth year]  Manus Gunning, PT 01/07/19 10:26 AM

## 2019-01-08 ENCOUNTER — Other Ambulatory Visit: Payer: Self-pay

## 2019-01-08 ENCOUNTER — Ambulatory Visit: Payer: BC Managed Care – PPO

## 2019-01-08 ENCOUNTER — Ambulatory Visit
Admission: RE | Admit: 2019-01-08 | Discharge: 2019-01-08 | Disposition: A | Discharge status: Home / Self Care | Payer: BC Managed Care – PPO | Source: Ambulatory Visit | Attending: Radiation Oncology | Admitting: Radiation Oncology

## 2019-01-08 DIAGNOSIS — Z171 Estrogen receptor negative status [ER-]: Secondary | ICD-10-CM | POA: Diagnosis not present

## 2019-01-08 DIAGNOSIS — Z510 Encounter for antineoplastic radiation therapy: Secondary | ICD-10-CM | POA: Diagnosis not present

## 2019-01-08 DIAGNOSIS — C50411 Malignant neoplasm of upper-outer quadrant of right female breast: Secondary | ICD-10-CM | POA: Diagnosis not present

## 2019-01-08 DIAGNOSIS — Z9013 Acquired absence of bilateral breasts and nipples: Secondary | ICD-10-CM | POA: Diagnosis not present

## 2019-01-08 DIAGNOSIS — Z9221 Personal history of antineoplastic chemotherapy: Secondary | ICD-10-CM | POA: Diagnosis not present

## 2019-01-09 ENCOUNTER — Ambulatory Visit: Payer: BC Managed Care – PPO

## 2019-01-09 ENCOUNTER — Ambulatory Visit
Admission: RE | Admit: 2019-01-09 | Discharge: 2019-01-09 | Disposition: A | Discharge status: Home / Self Care | Payer: BC Managed Care – PPO | Source: Ambulatory Visit | Attending: Radiation Oncology | Admitting: Radiation Oncology

## 2019-01-09 ENCOUNTER — Other Ambulatory Visit: Payer: Self-pay

## 2019-01-09 DIAGNOSIS — C50411 Malignant neoplasm of upper-outer quadrant of right female breast: Secondary | ICD-10-CM | POA: Diagnosis not present

## 2019-01-09 DIAGNOSIS — Z9221 Personal history of antineoplastic chemotherapy: Secondary | ICD-10-CM | POA: Diagnosis not present

## 2019-01-09 DIAGNOSIS — Z510 Encounter for antineoplastic radiation therapy: Secondary | ICD-10-CM | POA: Diagnosis not present

## 2019-01-09 DIAGNOSIS — Z483 Aftercare following surgery for neoplasm: Secondary | ICD-10-CM | POA: Diagnosis not present

## 2019-01-09 DIAGNOSIS — M6281 Muscle weakness (generalized): Secondary | ICD-10-CM | POA: Diagnosis not present

## 2019-01-09 DIAGNOSIS — M25511 Pain in right shoulder: Secondary | ICD-10-CM | POA: Diagnosis not present

## 2019-01-09 DIAGNOSIS — Z9013 Acquired absence of bilateral breasts and nipples: Secondary | ICD-10-CM | POA: Diagnosis not present

## 2019-01-09 DIAGNOSIS — M25611 Stiffness of right shoulder, not elsewhere classified: Secondary | ICD-10-CM | POA: Diagnosis not present

## 2019-01-09 DIAGNOSIS — M25612 Stiffness of left shoulder, not elsewhere classified: Secondary | ICD-10-CM | POA: Diagnosis not present

## 2019-01-09 DIAGNOSIS — R293 Abnormal posture: Secondary | ICD-10-CM | POA: Diagnosis not present

## 2019-01-09 DIAGNOSIS — Z171 Estrogen receptor negative status [ER-]: Secondary | ICD-10-CM | POA: Diagnosis not present

## 2019-01-09 NOTE — Therapy (Signed)
[place], [place], [address] Phone: [phone]   Fax:  [phone]  Physical Therapy Treatment  Patient Details  Name: Darlene Grant MRN: [id number] Date of Birth: 1975-09-20 Referring Provider (PT): [place]    Encounter Date: 01/09/2019  PT End of Session - 01/09/19 0958    Visit Number  8    Number of Visits  9    Date for PT Re-Evaluation  01/20/19    PT Start Time  0905    PT Stop Time  0955    PT Time Calculation (min)  50 min    Activity Tolerance  Patient tolerated treatment well    Behavior During Therapy  [facility] for tasks assessed/performed       Past Medical History:  Diagnosis Date  . Abnormal Pap smear    ASCUS 03/13/11  . Anemia   . Breast pain, left   . Cancer ([place])   . Candida vaginitis   . Complication of anesthesia    pt states that all narcotics make her angry and she would prefer to avoid them.  . Rectal itching   . Vaginal Pap smear, abnormal     Past Surgical History:  Procedure Laterality Date  . BREAST BIOPSY Right 8/16  . CESAREAN SECTION N/A 11/21/2013   Procedure: CESAREAN SECTION;  Surgeon: Alwyn Pea, MD;  Location: [facility];  Service: Obstetrics;  Laterality: N/A;  . CESAREAN SECTION N/A 06/23/2016   Procedure: CESAREAN SECTION;  Surgeon: Emily Filbert, MD;  Location: [facility];  Service: Obstetrics;  Laterality: N/A;  . HAND SURGERY  04/2012   ligament repair  . MASTECTOMY WITH AXILLARY LYMPH NODE DISSECTION Bilateral 11/14/2018   Procedure: RIGHT MASTECTOMY WITH LEFT AXILLARY LYMPH NODE DISSECTION AND LEFT RISK REDUCING MASTECTOMY;  Surgeon: Rolm Bookbinder, MD;  Location: [facility];  Service: General;  Laterality: Bilateral;  . PILONIDAL CYST EXCISION  2000  . PORTACATH PLACEMENT N/A 05/28/2018   Procedure: INSERTION PORT-A-CATH WITH ULTRASOUND;  Surgeon: Rolm Bookbinder, MD;  Location: [facility];   Service: General;  Laterality: N/A;  . WISDOM TOOTH EXTRACTION  AGE 43    There were no vitals filed for this visit.  Subjective Assessment - 01/09/19 0910    Subjective  I've been able to do the foam roll in the last day or 2 but only for short period of time but it stetches that spot that feels the tightest.    Pertinent History  breast cancer diagnosed November 2019: triple negative with Ki of 75% She completed chemotherapy 10/18/2018. 11/14/2018: she underwent bilateral mastectomy with 0/13 nodes positive on the right    Patient Stated Goals  to learn what she needs to know for exercises post op and to hopefully avoid lymphedema     Currently in Pain?  No/denies                       [facility] Adult PT Treatment/Exercise - 01/09/19 0001      Manual Therapy   Myofascial Release  to cording in Rt axilla extending in to chest where pt has increased tightness but this seems improved from when this therapist saw her last    Manual Lymphatic Drainage (MLD)  In supine: prolonged short neck due to pt noticing increased swelling here from radiation, 5 diaphragmatic breaths, left axillary and right inguinal nodes; anterior inter-axillary pathway and right axillo-inguinal pathway. Right UE from dorsal hand  to shoulder redirecting along pathways.    Passive ROM  to Rt shoulder in direction of flexion, abduction and D2                  PT Long Term Goals - 12/18/18 1733      PT LONG TERM GOAL #1   Title  Pt will have 150 degrees of painfree right shoulder abduction so that she can perform her household chores easier    Baseline  108 with pain    Time  4    Period  Weeks    Status  New      PT LONG TERM GOAL #2   Title  Pt will report 50% decrease in right shoulder tightness and pulling from axillary cording    Time  4    Period  Weeks    Status  New      PT LONG TERM GOAL #3   Title  Pt will be independent in a home exercise program for shoulder ROM and strength     Time  4    Period  Weeks    Status  New            Plan - 01/09/19 0907    Clinical Impression Statement  Pt reports starting to feel some increased tightness from radiation but has been working to stretch thru this. Encouraged pt to continue doing so as long as possible throughout radiation but to decr end ROM if she starts to feel pulling at skin. She verbalized understanding this. Cont with manual therapy focusing on end ROM and decreasing myofascial tightness at end ROM at axilla to incision. Pt reports feeling less tightness at end of session.    Personal Factors and Comorbidities  Comorbidity 1    Comorbidities  recent chemo    Examination-Activity Limitations  Bathing;Reach Overhead;Caring for Others;Lift;Hygiene/Grooming    Stability/Clinical Decision Making  Evolving/Moderate complexity    Rehab Potential  Good    PT Frequency  2x / week    PT Duration  4 weeks    PT Treatment/Interventions  ADLs/Self Care Home Management;Therapeutic exercise;Manual lymph drainage;Manual techniques    PT Next Visit Plan  Renewal needed in next 1-2 sessions if pt is to cont therapy; cont PROM to Rt shoulder at end range, Cont MLD prn, manual work for axillary cording    PT Home Exercise Plan  Doorway stretches, supine scap series; supine over towel roll or foam as tolerated along T-spine for bil abduction    Consulted and Agree with Plan of Care  Patient       Patient will benefit from skilled therapeutic intervention in order to improve the following deficits and impairments:  Decreased knowledge of precautions, Decreased knowledge of use of DME, Postural dysfunction, Decreased range of motion, Increased fascial restricitons, Impaired UE functional use, Increased muscle spasms, Impaired perceived functional ability, Pain, Decreased scar mobility, Decreased strength, Increased edema  Visit Diagnosis: 1. Stiffness of right shoulder joint   2. Acute pain of right shoulder   3. Aftercare  following surgery for neoplasm   4. Abnormal posture   5. Muscle weakness (generalized)        Problem List Patient Active Problem List   Diagnosis Date Noted  . History of right breast cancer 11/14/2018  . Chemotherapy induced neutropenia ([place]) 09/13/2018  . Port-A-Cath in place 06/10/2018  . Malignant neoplasm of upper-outer quadrant of right breast in female, estrogen receptor negative ([place]) 05/09/2018  . Low iron  stores 01/16/2018  . Muscle spasm 01/15/2018  . Piriformis syndrome of left side 01/15/2018  . Grade I hemorrhoids 02/23/2017  . Low lying posterior placenta, antepartum 02/01/2016  . AMA (advanced maternal age) multigravida 35+ 11/22/2015  . 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 Calcification of right breast 02/19/2015  . Ligament tear 07/05/2012    Otelia Limes, PTA 01/09/2019, 10:05 AM  [place], [place], [address] Phone: [phone]   Fax:  [phone]  Name: Darlene Grant MRN: [id number] Date of Birth: 1976-03-04

## 2019-01-10 ENCOUNTER — Ambulatory Visit: Payer: BC Managed Care – PPO

## 2019-01-10 ENCOUNTER — Ambulatory Visit
Admission: RE | Admit: 2019-01-10 | Discharge: 2019-01-10 | Disposition: A | Discharge status: Home / Self Care | Payer: BC Managed Care – PPO | Source: Ambulatory Visit | Attending: Radiation Oncology | Admitting: Radiation Oncology

## 2019-01-10 ENCOUNTER — Other Ambulatory Visit: Payer: Self-pay

## 2019-01-10 DIAGNOSIS — Z171 Estrogen receptor negative status [ER-]: Secondary | ICD-10-CM | POA: Diagnosis not present

## 2019-01-10 DIAGNOSIS — C50411 Malignant neoplasm of upper-outer quadrant of right female breast: Secondary | ICD-10-CM | POA: Diagnosis not present

## 2019-01-10 DIAGNOSIS — Z510 Encounter for antineoplastic radiation therapy: Secondary | ICD-10-CM | POA: Diagnosis not present

## 2019-01-10 DIAGNOSIS — Z9013 Acquired absence of bilateral breasts and nipples: Secondary | ICD-10-CM | POA: Diagnosis not present

## 2019-01-10 DIAGNOSIS — Z9221 Personal history of antineoplastic chemotherapy: Secondary | ICD-10-CM | POA: Diagnosis not present

## 2019-01-13 ENCOUNTER — Other Ambulatory Visit: Payer: Self-pay | Admitting: Obstetrics & Gynecology

## 2019-01-13 ENCOUNTER — Other Ambulatory Visit: Payer: Self-pay | Admitting: *Deleted

## 2019-01-13 ENCOUNTER — Ambulatory Visit: Payer: BC Managed Care – PPO

## 2019-01-13 ENCOUNTER — Other Ambulatory Visit: Payer: Self-pay

## 2019-01-13 ENCOUNTER — Ambulatory Visit
Admission: RE | Admit: 2019-01-13 | Discharge: 2019-01-13 | Disposition: A | Discharge status: Home / Self Care | Payer: BC Managed Care – PPO | Source: Ambulatory Visit | Attending: Radiation Oncology | Admitting: Radiation Oncology

## 2019-01-13 DIAGNOSIS — M25511 Pain in right shoulder: Secondary | ICD-10-CM

## 2019-01-13 DIAGNOSIS — Z483 Aftercare following surgery for neoplasm: Secondary | ICD-10-CM

## 2019-01-13 DIAGNOSIS — Z171 Estrogen receptor negative status [ER-]: Secondary | ICD-10-CM

## 2019-01-13 DIAGNOSIS — R293 Abnormal posture: Secondary | ICD-10-CM

## 2019-01-13 DIAGNOSIS — Z510 Encounter for antineoplastic radiation therapy: Secondary | ICD-10-CM | POA: Diagnosis not present

## 2019-01-13 DIAGNOSIS — C50411 Malignant neoplasm of upper-outer quadrant of right female breast: Secondary | ICD-10-CM | POA: Diagnosis not present

## 2019-01-13 DIAGNOSIS — M25612 Stiffness of left shoulder, not elsewhere classified: Secondary | ICD-10-CM | POA: Diagnosis not present

## 2019-01-13 DIAGNOSIS — Z0000 Encounter for general adult medical examination without abnormal findings: Secondary | ICD-10-CM

## 2019-01-13 DIAGNOSIS — Z9013 Acquired absence of bilateral breasts and nipples: Secondary | ICD-10-CM | POA: Diagnosis not present

## 2019-01-13 DIAGNOSIS — M6281 Muscle weakness (generalized): Secondary | ICD-10-CM | POA: Diagnosis not present

## 2019-01-13 DIAGNOSIS — M25611 Stiffness of right shoulder, not elsewhere classified: Secondary | ICD-10-CM

## 2019-01-13 DIAGNOSIS — Z9221 Personal history of antineoplastic chemotherapy: Secondary | ICD-10-CM | POA: Diagnosis not present

## 2019-01-13 NOTE — Progress Notes (Signed)
Lipids and TSH ordered

## 2019-01-13 NOTE — Therapy (Signed)
[place], [place], [address] Phone: [phone]   Fax:  [phone]  Physical Therapy Treatment  Patient Details  Name: Darlene Grant MRN: [id number] Date of Birth: 17-Jan-1976 Referring Provider (PT): [place]    Encounter Date: 01/13/2019  PT End of Session - 01/13/19 0956    Visit Number  9    Number of Visits  9    Date for PT Re-Evaluation  01/20/19    PT Start Time  0909   pt arrived late from radiation   PT Stop Time  0956    PT Time Calculation (min)  47 min    Activity Tolerance  Patient tolerated treatment well    Behavior During Therapy  [facility] for tasks assessed/performed       Past Medical History:  Diagnosis Date  . Abnormal Pap smear    ASCUS 03/13/11  . Anemia   . Breast pain, left   . Cancer ([place])   . Candida vaginitis   . Complication of anesthesia    pt states that all narcotics make her angry and she would prefer to avoid them.  . Rectal itching   . Vaginal Pap smear, abnormal     Past Surgical History:  Procedure Laterality Date  . BREAST BIOPSY Right 8/16  . CESAREAN SECTION N/A 11/21/2013   Procedure: CESAREAN SECTION;  Surgeon: Alwyn Pea, MD;  Location: [facility];  Service: Obstetrics;  Laterality: N/A;  . CESAREAN SECTION N/A 06/23/2016   Procedure: CESAREAN SECTION;  Surgeon: Emily Filbert, MD;  Location: [facility];  Service: Obstetrics;  Laterality: N/A;  . HAND SURGERY  04/2012   ligament repair  . MASTECTOMY WITH AXILLARY LYMPH NODE DISSECTION Bilateral 11/14/2018   Procedure: RIGHT MASTECTOMY WITH LEFT AXILLARY LYMPH NODE DISSECTION AND LEFT RISK REDUCING MASTECTOMY;  Surgeon: Rolm Bookbinder, MD;  Location: [facility];  Service: General;  Laterality: Bilateral;  . PILONIDAL CYST EXCISION  2000  . PORTACATH PLACEMENT N/A 05/28/2018   Procedure: INSERTION PORT-A-CATH WITH ULTRASOUND;  Surgeon: Rolm Bookbinder, MD;   Location: [facility];  Service: General;  Laterality: N/A;  . WISDOM TOOTH EXTRACTION  AGE 40    There were no vitals filed for this visit.  Subjective Assessment - 01/13/19 0912    Subjective  I've been sleeping with a wrist brace bc since doing the neural tension stretch we talked about last time, an old injury of mine flared up a little but it feels better now since doing that.    Pertinent History  breast cancer diagnosed November 2019: triple negative with Ki of 75% She completed chemotherapy 10/18/2018. 11/14/2018: she underwent bilateral mastectomy with 0/13 nodes positive on the right    Patient Stated Goals  to learn what she needs to know for exercises post op and to hopefully avoid lymphedema     Currently in Pain?  No/denies                       [facility] Adult PT Treatment/Exercise - 01/13/19 0001      Manual Therapy   Myofascial Release  to cording in Rt axilla extending in to chest where pt has increased tightness    Manual Lymphatic Drainage (MLD)  In supine: prolonged short neck due to pt noticing increased swelling here from radiation, 5 diaphragmatic breaths, left axillary and right inguinal nodes; anterior inter-axillary pathway and right axillo-inguinal pathway.  Passive ROM  to Rt shoulder in direction of flexion, abduction and D2                  PT Long Term Goals - 12/18/18 1733      PT LONG TERM GOAL #1   Title  Pt will have 150 degrees of painfree right shoulder abduction so that she can perform her household chores easier    Baseline  108 with pain    Time  4    Period  Weeks    Status  New      PT LONG TERM GOAL #2   Title  Pt will report 50% decrease in right shoulder tightness and pulling from axillary cording    Time  4    Period  Weeks    Status  New      PT LONG TERM GOAL #3   Title  Pt will be independent in a home exercise program for shoulder ROM and strength    Time  4    Period  Weeks    Status  New             Plan - 01/13/19 0957    Clinical Impression Statement  Pt continues with reports of tightness at her end ROM and pectoralis is very tight into axilla. So continued with manual therapy focusing on end ROM and myofascial release. She reports good relief from this tightness by end of session. Pt would like to continue therapy so will confer with PT and if agreeable have pt renewed at next session.    Personal Factors and Comorbidities  Comorbidity 1    Comorbidities  recent chemo    Examination-Activity Limitations  Bathing;Reach Overhead;Caring for Others;Lift;Hygiene/Grooming    Stability/Clinical Decision Making  Evolving/Moderate complexity    Rehab Potential  Good    PT Frequency  2x / week    PT Duration  4 weeks    PT Treatment/Interventions  ADLs/Self Care Home Management;Therapeutic exercise;Manual lymph drainage;Manual techniques    PT Next Visit Plan  Renewal next session if PT agreeable/reassess ROM and goals for this. Cont P/ROM to Rt shoulder at end range, cont MLD prn and manual work for axillary cording    Consulted and Agree with Plan of Care  Patient       Patient will benefit from skilled therapeutic intervention in order to improve the following deficits and impairments:  Decreased knowledge of precautions, Decreased knowledge of use of DME, Postural dysfunction, Decreased range of motion, Increased fascial restricitons, Impaired UE functional use, Increased muscle spasms, Impaired perceived functional ability, Pain, Decreased scar mobility, Decreased strength, Increased edema  Visit Diagnosis: 1. Stiffness of right shoulder joint   2. Acute pain of right shoulder   3. Aftercare following surgery for neoplasm   4. Abnormal posture   5. Muscle weakness (generalized)        Problem List Patient Active Problem List   Diagnosis Date Noted  . History of right breast cancer 11/14/2018  . Chemotherapy induced neutropenia ([place]) 09/13/2018  . Port-A-Cath  in place 06/10/2018  . Malignant neoplasm of upper-outer quadrant of right breast in female, estrogen receptor negative ([place]) 05/09/2018  . Low iron stores 01/16/2018  . Muscle spasm 01/15/2018  . Piriformis syndrome of left side 01/15/2018  . Grade I hemorrhoids 02/23/2017  . Low lying posterior placenta, antepartum 02/01/2016  . AMA (advanced maternal age) multigravida 35+ 11/22/2015  . 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 Calcification of  right breast 02/19/2015  . Ligament tear 07/05/2012    Otelia Limes, PTA 01/13/2019, 10:03 AM  [place], [place], [address] Phone: [phone]   Fax:  [phone]  Name: LAURAJEAN HOSEK MRN: [id number] Date of Birth: 1976/01/04

## 2019-01-14 ENCOUNTER — Ambulatory Visit: Payer: BC Managed Care – PPO

## 2019-01-14 ENCOUNTER — Other Ambulatory Visit: Payer: Self-pay

## 2019-01-14 ENCOUNTER — Ambulatory Visit
Admission: RE | Admit: 2019-01-14 | Discharge: 2019-01-14 | Disposition: A | Discharge status: Home / Self Care | Payer: BC Managed Care – PPO | Source: Ambulatory Visit | Attending: Radiation Oncology | Admitting: Radiation Oncology

## 2019-01-14 DIAGNOSIS — Z171 Estrogen receptor negative status [ER-]: Secondary | ICD-10-CM | POA: Diagnosis not present

## 2019-01-14 DIAGNOSIS — Z9013 Acquired absence of bilateral breasts and nipples: Secondary | ICD-10-CM | POA: Diagnosis not present

## 2019-01-14 DIAGNOSIS — Z9221 Personal history of antineoplastic chemotherapy: Secondary | ICD-10-CM | POA: Diagnosis not present

## 2019-01-14 DIAGNOSIS — Z510 Encounter for antineoplastic radiation therapy: Secondary | ICD-10-CM | POA: Diagnosis not present

## 2019-01-14 DIAGNOSIS — C50411 Malignant neoplasm of upper-outer quadrant of right female breast: Secondary | ICD-10-CM | POA: Diagnosis not present

## 2019-01-14 MED ORDER — RADIAPLEXRX EX GEL
Freq: Once | CUTANEOUS | Status: AC
  Administered 2019-01-14: 17:00:00 via TOPICAL

## 2019-01-14 MED ORDER — SONAFINE EX EMUL
1.0000 "application " | Freq: Two times a day (BID) | CUTANEOUS | Status: DC
  Administered 2019-01-14: 1 via TOPICAL

## 2019-01-15 ENCOUNTER — Other Ambulatory Visit: Payer: Self-pay

## 2019-01-15 ENCOUNTER — Ambulatory Visit
Admission: RE | Admit: 2019-01-15 | Discharge: 2019-01-15 | Disposition: A | Discharge status: Home / Self Care | Payer: BC Managed Care – PPO | Source: Ambulatory Visit | Attending: Radiation Oncology | Admitting: Radiation Oncology

## 2019-01-15 ENCOUNTER — Ambulatory Visit: Payer: BC Managed Care – PPO

## 2019-01-15 ENCOUNTER — Other Ambulatory Visit: Payer: Self-pay | Admitting: *Deleted

## 2019-01-15 DIAGNOSIS — R293 Abnormal posture: Secondary | ICD-10-CM | POA: Diagnosis not present

## 2019-01-15 DIAGNOSIS — Z483 Aftercare following surgery for neoplasm: Secondary | ICD-10-CM

## 2019-01-15 DIAGNOSIS — M25612 Stiffness of left shoulder, not elsewhere classified: Secondary | ICD-10-CM | POA: Diagnosis not present

## 2019-01-15 DIAGNOSIS — M25611 Stiffness of right shoulder, not elsewhere classified: Secondary | ICD-10-CM | POA: Diagnosis not present

## 2019-01-15 DIAGNOSIS — Z9013 Acquired absence of bilateral breasts and nipples: Secondary | ICD-10-CM | POA: Diagnosis not present

## 2019-01-15 DIAGNOSIS — M6281 Muscle weakness (generalized): Secondary | ICD-10-CM | POA: Diagnosis not present

## 2019-01-15 DIAGNOSIS — C50411 Malignant neoplasm of upper-outer quadrant of right female breast: Secondary | ICD-10-CM | POA: Diagnosis not present

## 2019-01-15 DIAGNOSIS — M25511 Pain in right shoulder: Secondary | ICD-10-CM

## 2019-01-15 DIAGNOSIS — Z9221 Personal history of antineoplastic chemotherapy: Secondary | ICD-10-CM | POA: Diagnosis not present

## 2019-01-15 DIAGNOSIS — Z171 Estrogen receptor negative status [ER-]: Secondary | ICD-10-CM | POA: Diagnosis not present

## 2019-01-15 DIAGNOSIS — Z510 Encounter for antineoplastic radiation therapy: Secondary | ICD-10-CM | POA: Diagnosis not present

## 2019-01-15 NOTE — Addendum Note (Signed)
Addended by: Shan Levans R on: 01/15/2019 03:07 PM   Modules accepted: Orders

## 2019-01-15 NOTE — Therapy (Signed)
[place], [place], [address] Phone: [phone]   Fax:  [phone]  Physical Therapy Treatment  Patient Details  Name: Darlene Grant MRN: [id number] Date of Birth: 1976/05/05 Referring Provider (PT): [place]    Encounter Date: 01/15/2019  PT End of Session - 01/15/19 0957    Visit Number  10    Number of Visits  21    Date for PT Re-Evaluation  02/26/19    PT Start Time  0902    PT Stop Time  0953    PT Time Calculation (min)  51 min    Activity Tolerance  Patient tolerated treatment well    Behavior During Therapy  [facility] for tasks assessed/performed       Past Medical History:  Diagnosis Date  . Abnormal Pap smear    ASCUS 03/13/11  . Anemia   . Breast pain, left   . Cancer ([place])   . Candida vaginitis   . Complication of anesthesia    pt states that all narcotics make her angry and she would prefer to avoid them.  . Rectal itching   . Vaginal Pap smear, abnormal     Past Surgical History:  Procedure Laterality Date  . BREAST BIOPSY Right 8/16  . CESAREAN SECTION N/A 11/21/2013   Procedure: CESAREAN SECTION;  Surgeon: Alwyn Pea, MD;  Location: [facility];  Service: Obstetrics;  Laterality: N/A;  . CESAREAN SECTION N/A 06/23/2016   Procedure: CESAREAN SECTION;  Surgeon: Emily Filbert, MD;  Location: [facility];  Service: Obstetrics;  Laterality: N/A;  . HAND SURGERY  04/2012   ligament repair  . MASTECTOMY WITH AXILLARY LYMPH NODE DISSECTION Bilateral 11/14/2018   Procedure: RIGHT MASTECTOMY WITH LEFT AXILLARY LYMPH NODE DISSECTION AND LEFT RISK REDUCING MASTECTOMY;  Surgeon: Rolm Bookbinder, MD;  Location: [facility];  Service: General;  Laterality: Bilateral;  . PILONIDAL CYST EXCISION  2000  . PORTACATH PLACEMENT N/A 05/28/2018   Procedure: INSERTION PORT-A-CATH WITH ULTRASOUND;  Surgeon: Rolm Bookbinder, MD;  Location: [facility];   Service: General;  Laterality: N/A;  . WISDOM TOOTH EXTRACTION  AGE 43    There were no vitals filed for this visit.  Subjective Assessment - 01/15/19 0904    Subjective  My Rt shoulder is tight but I feel good.    Pertinent History  breast cancer diagnosed November 2019: triple negative with Ki of 75% She completed chemotherapy 10/18/2018. 11/14/2018: she underwent bilateral mastectomy with 0/13 nodes positive on the right    Patient Stated Goals  to learn what she needs to know for exercises post op and to hopefully avoid lymphedema     Currently in Pain?  No/denies         [facility] PT Assessment - 01/15/19 0001      AROM   Right Shoulder Flexion  164 Degrees    Right Shoulder ABduction  167 Degrees    Left Shoulder Flexion  161 Degrees   reports tightness at end ROM   Left Shoulder ABduction  176 Degrees   also reports tightness at end ROM                  [facility] Adult PT Treatment/Exercise - 01/15/19 0001      Manual Therapy   Myofascial Release  to cording in Rt axilla extending in to chest where pt has increased tightness    Manual Lymphatic Drainage (MLD)  In supine: prolonged  short neck due to pt noticing increased swelling here from radiation, 5 diaphragmatic breaths, left axillary and right inguinal nodes; anterior inter-axillary pathway and right axillo-inguinal pathway.     Passive ROM  to Rt shoulder in direction of flexion, abduction and D2                  PT Long Term Goals - 01/15/19 0908      PT LONG TERM GOAL #1   Title  Pt will have 150 degrees of painfree right shoulder abduction so that she can perform her household chores easier    Baseline  108 with pain; 164 degrees without pain-01/15/19    Status  Achieved      PT LONG TERM GOAL #2   Title  Pt will report 50% decrease in right shoulder tightness and pulling from axillary cording    Baseline  75% reported at this time-01/15/19    Status  Achieved      PT LONG TERM GOAL #3   Title   Pt will be independent in a home exercise program for shoulder ROM and strength    Status  On-going      PT LONG TERM GOAL #4   Title  Pt to report less tightness with OH reaching by at least 50% at Lt axilla.    Status  New            Plan - 01/15/19 1000    Clinical Impression Statement  Renewal done this session. Pt tolerated manual therapy well and continues to report good reduction of tightness at end ROM. She is approximately half way through radiation at this time and reports her Lt shoulder end ROM has been feeling tighter with reaching [place] and knows she will need to rely on her Lt UE more until radiation is over. So would like to add Lt UE to POC to focus on decreasing her end ROM tightness. Shan Levans, PT is agreeable to this so goal was added to renewal done today. Pt will benefit from conintued therapy at this point to help her at least maintain ROM throughout duration of treatment, increase strength and improve Lt UE end ROM c/o tightness as she will rely on her Lt UE more through the duration of radiation.    Personal Factors and Comorbidities  Comorbidity 1    Comorbidities  recent chemo    Examination-Activity Limitations  Bathing;Reach Overhead;Caring for Others;Lift;Hygiene/Grooming    Stability/Clinical Decision Making  Evolving/Moderate complexity    Rehab Potential  Good    PT Frequency  2x / week    PT Duration  4 weeks    PT Treatment/Interventions  ADLs/Self Care Home Management;Therapeutic exercise;Manual lymph drainage;Manual techniques    PT Next Visit Plan  Renewed this session. Instruct pt in Strength ABC Program, then continue monitoring Rt A/ROM while undergoing radiation, and add end P/ROM stretching, assessing pt throughout for increased redness and skin fragility as pt is aware she may need to be placed on hold if she experinces skin break down.    PT Home Exercise Plan  Doorway stretches, supine scap series; supine over towel roll or foam as tolerated along  T-spine for bil abduction    Consulted and Agree with Plan of Care  Patient       Patient will benefit from skilled therapeutic intervention in order to improve the following deficits and impairments:  Decreased knowledge of precautions, Decreased knowledge of use of DME, Postural dysfunction, Decreased range of motion,  Increased fascial restricitons, Impaired UE functional use, Increased muscle spasms, Impaired perceived functional ability, Pain, Decreased scar mobility, Decreased strength, Increased edema  Visit Diagnosis: 1. Stiffness of right shoulder joint   2. Acute pain of right shoulder   3. Aftercare following surgery for neoplasm   4. Abnormal posture   5. Muscle weakness (generalized)   6. Stiffness of left shoulder, not elsewhere classified        Problem List Patient Active Problem List   Diagnosis Date Noted  . History of right breast cancer 11/14/2018  . Chemotherapy induced neutropenia ([place]) 09/13/2018  . Port-A-Cath in place 06/10/2018  . Malignant neoplasm of upper-outer quadrant of right breast in female, estrogen receptor negative ([place]) 05/09/2018  . Low iron stores 01/16/2018  . Muscle spasm 01/15/2018  . Piriformis syndrome of left side 01/15/2018  . Grade I hemorrhoids 02/23/2017  . Low lying posterior placenta, antepartum 02/01/2016  . AMA (advanced maternal age) multigravida 35+ 11/22/2015  . 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 Calcification of right breast 02/19/2015  . Ligament tear 07/05/2012    Otelia Limes, PTA 01/15/2019, 10:57 AM  [place], [place], [address] Phone: [phone]   Fax:  [phone]  Name: Darlene Grant MRN: [id number] Date of Birth: 07-28-1975

## 2019-01-16 ENCOUNTER — Ambulatory Visit: Payer: BC Managed Care – PPO

## 2019-01-16 ENCOUNTER — Other Ambulatory Visit: Payer: Self-pay

## 2019-01-16 ENCOUNTER — Ambulatory Visit
Admission: RE | Admit: 2019-01-16 | Discharge: 2019-01-16 | Disposition: A | Discharge status: Home / Self Care | Payer: BC Managed Care – PPO | Source: Ambulatory Visit | Attending: Radiation Oncology | Admitting: Radiation Oncology

## 2019-01-16 DIAGNOSIS — Z171 Estrogen receptor negative status [ER-]: Secondary | ICD-10-CM | POA: Diagnosis not present

## 2019-01-16 DIAGNOSIS — Z9013 Acquired absence of bilateral breasts and nipples: Secondary | ICD-10-CM | POA: Diagnosis not present

## 2019-01-16 DIAGNOSIS — C50411 Malignant neoplasm of upper-outer quadrant of right female breast: Secondary | ICD-10-CM | POA: Diagnosis not present

## 2019-01-16 DIAGNOSIS — Z9221 Personal history of antineoplastic chemotherapy: Secondary | ICD-10-CM | POA: Diagnosis not present

## 2019-01-16 DIAGNOSIS — Z9012 Acquired absence of left breast and nipple: Secondary | ICD-10-CM | POA: Diagnosis not present

## 2019-01-16 DIAGNOSIS — C50911 Malignant neoplasm of unspecified site of right female breast: Secondary | ICD-10-CM | POA: Diagnosis not present

## 2019-01-16 DIAGNOSIS — Z510 Encounter for antineoplastic radiation therapy: Secondary | ICD-10-CM | POA: Diagnosis not present

## 2019-01-17 ENCOUNTER — Other Ambulatory Visit: Payer: Self-pay

## 2019-01-17 ENCOUNTER — Ambulatory Visit: Payer: BC Managed Care – PPO

## 2019-01-17 ENCOUNTER — Ambulatory Visit
Admission: RE | Admit: 2019-01-17 | Discharge: 2019-01-17 | Disposition: A | Discharge status: Home / Self Care | Payer: BC Managed Care – PPO | Source: Ambulatory Visit | Attending: Radiation Oncology | Admitting: Radiation Oncology

## 2019-01-17 DIAGNOSIS — Z171 Estrogen receptor negative status [ER-]: Secondary | ICD-10-CM | POA: Diagnosis not present

## 2019-01-17 DIAGNOSIS — Z510 Encounter for antineoplastic radiation therapy: Secondary | ICD-10-CM | POA: Diagnosis not present

## 2019-01-17 DIAGNOSIS — Z9013 Acquired absence of bilateral breasts and nipples: Secondary | ICD-10-CM | POA: Diagnosis not present

## 2019-01-17 DIAGNOSIS — Z0000 Encounter for general adult medical examination without abnormal findings: Secondary | ICD-10-CM

## 2019-01-17 DIAGNOSIS — C50411 Malignant neoplasm of upper-outer quadrant of right female breast: Secondary | ICD-10-CM | POA: Diagnosis not present

## 2019-01-17 DIAGNOSIS — Z9221 Personal history of antineoplastic chemotherapy: Secondary | ICD-10-CM | POA: Diagnosis not present

## 2019-01-17 NOTE — Progress Notes (Signed)
TSH and lipid panel order placed per Dr Hulan Fray

## 2019-01-20 ENCOUNTER — Other Ambulatory Visit: Payer: Self-pay

## 2019-01-20 ENCOUNTER — Ambulatory Visit
Admission: RE | Admit: 2019-01-20 | Discharge: 2019-01-20 | Disposition: A | Discharge status: Home / Self Care | Payer: BC Managed Care – PPO | Source: Ambulatory Visit | Attending: Radiation Oncology | Admitting: Radiation Oncology

## 2019-01-20 ENCOUNTER — Ambulatory Visit: Payer: BC Managed Care – PPO

## 2019-01-20 ENCOUNTER — Ambulatory Visit: Payer: BC Managed Care – PPO | Attending: General Surgery

## 2019-01-20 DIAGNOSIS — Z9013 Acquired absence of bilateral breasts and nipples: Secondary | ICD-10-CM | POA: Insufficient documentation

## 2019-01-20 DIAGNOSIS — Z483 Aftercare following surgery for neoplasm: Secondary | ICD-10-CM | POA: Diagnosis not present

## 2019-01-20 DIAGNOSIS — C50411 Malignant neoplasm of upper-outer quadrant of right female breast: Secondary | ICD-10-CM | POA: Diagnosis not present

## 2019-01-20 DIAGNOSIS — Z171 Estrogen receptor negative status [ER-]: Secondary | ICD-10-CM | POA: Insufficient documentation

## 2019-01-20 DIAGNOSIS — Z9221 Personal history of antineoplastic chemotherapy: Secondary | ICD-10-CM | POA: Insufficient documentation

## 2019-01-20 DIAGNOSIS — M6281 Muscle weakness (generalized): Secondary | ICD-10-CM | POA: Insufficient documentation

## 2019-01-20 DIAGNOSIS — M25511 Pain in right shoulder: Secondary | ICD-10-CM | POA: Diagnosis not present

## 2019-01-20 DIAGNOSIS — Z510 Encounter for antineoplastic radiation therapy: Secondary | ICD-10-CM | POA: Insufficient documentation

## 2019-01-20 DIAGNOSIS — R293 Abnormal posture: Secondary | ICD-10-CM | POA: Insufficient documentation

## 2019-01-20 DIAGNOSIS — M25611 Stiffness of right shoulder, not elsewhere classified: Secondary | ICD-10-CM | POA: Diagnosis not present

## 2019-01-20 DIAGNOSIS — M25612 Stiffness of left shoulder, not elsewhere classified: Secondary | ICD-10-CM | POA: Diagnosis not present

## 2019-01-20 NOTE — Therapy (Signed)
[place], [place], [address] Phone: [phone]   Fax:  [phone]  Physical Therapy Treatment  Patient Details  Name: Darlene Grant MRN: [id number] Date of Birth: 1976/01/09 Referring Provider (PT): [place]    Encounter Date: 01/20/2019  PT End of Session - 01/20/19 1053    Visit Number  11    Number of Visits  21    Date for PT Re-Evaluation  02/26/19    PT Start Time  3846    PT Stop Time  1052    PT Time Calculation (min)  50 min    Activity Tolerance  Patient tolerated treatment well    Behavior During Therapy  [facility] for tasks assessed/performed       Past Medical History:  Diagnosis Date  . Abnormal Pap smear    ASCUS 03/13/11  . Anemia   . Breast pain, left   . Cancer ([place])   . Candida vaginitis   . Complication of anesthesia    pt states that all narcotics make her angry and she would prefer to avoid them.  . Rectal itching   . Vaginal Pap smear, abnormal     Past Surgical History:  Procedure Laterality Date  . BREAST BIOPSY Right 8/16  . CESAREAN SECTION N/A 11/21/2013   Procedure: CESAREAN SECTION;  Surgeon: Alwyn Pea, MD;  Location: [facility];  Service: Obstetrics;  Laterality: N/A;  . CESAREAN SECTION N/A 06/23/2016   Procedure: CESAREAN SECTION;  Surgeon: Emily Filbert, MD;  Location: [place];  Service: Obstetrics;  Laterality: N/A;  . HAND SURGERY  04/2012   ligament repair  . MASTECTOMY WITH AXILLARY LYMPH NODE DISSECTION Bilateral 11/14/2018   Procedure: RIGHT MASTECTOMY WITH LEFT AXILLARY LYMPH NODE DISSECTION AND LEFT RISK REDUCING MASTECTOMY;  Surgeon: Rolm Bookbinder, MD;  Location: [facility];  Service: General;  Laterality: Bilateral;  . PILONIDAL CYST EXCISION  2000  . PORTACATH PLACEMENT N/A 05/28/2018   Procedure: INSERTION PORT-A-CATH WITH ULTRASOUND;  Surgeon: Rolm Bookbinder, MD;  Location: [facility];   Service: General;  Laterality: N/A;  . WISDOM TOOTH EXTRACTION  AGE 43    There were no vitals filed for this visit.  Subjective Assessment - 01/20/19 1005    Subjective  My Rt shoulder is still doing pretty good but I've noticed but my Lt is getting tight I think from overuse lately.    Pertinent History  breast cancer diagnosed November 2019: triple negative with Ki of 75% She completed chemotherapy 10/18/2018. 11/14/2018: she underwent bilateral mastectomy with 0/13 nodes positive on the right    Patient Stated Goals  to learn what she needs to know for exercises post op and to hopefully avoid lymphedema     Currently in Pain?  No/denies                       [facility] Adult PT Treatment/Exercise - 01/20/19 0001      Manual Therapy   Myofascial Release  to cording in Rt axilla extending in to chest where pt has increased tightness, then same to Lt    Manual Lymphatic Drainage (MLD)  In supine: prolonged short neck due to pt noticing increased swelling here from radiation, 5 diaphragmatic breaths, left axillary and right inguinal nodes; anterior inter-axillary pathway and right axillo-inguinal pathway.     Passive ROM  to Rt shoulder in direction of flexion, abduction and D2; then same for  Lt                  PT Long Term Goals - 01/15/19 0908      PT LONG TERM GOAL #1   Title  Pt will have 150 degrees of painfree right shoulder abduction so that she can perform her household chores easier    Baseline  108 with pain; 164 degrees without pain-01/15/19    Status  Achieved      PT LONG TERM GOAL #2   Title  Pt will report 50% decrease in right shoulder tightness and pulling from axillary cording    Baseline  75% reported at this time-01/15/19    Status  Achieved      PT LONG TERM GOAL #3   Title  Pt will be independent in a home exercise program for shoulder ROM and strength    Status  On-going      PT LONG TERM GOAL #4   Title  Pt to report less tightness with  OH reaching by at least 50% at Lt axilla.    Status  New            Plan - 01/20/19 1053    Clinical Impression Statement  Continued with manaul therapy adding Lt UE for end ROM tightness. She tolerated all well today and reports good relief for both after session. Starting to develop more redness at skin from radiation so will monitor this closely.    Personal Factors and Comorbidities  Comorbidity 1    Comorbidities  recent chemo    Examination-Activity Limitations  Bathing;Reach Overhead;Caring for Others;Lift;Hygiene/Grooming    Stability/Clinical Decision Making  Evolving/Moderate complexity    Rehab Potential  Good    PT Frequency  2x / week    PT Duration  4 weeks    PT Treatment/Interventions  ADLs/Self Care Home Management;Therapeutic exercise;Manual lymph drainage;Manual techniques    PT Next Visit Plan  Cont focusing on end ROM for bil shoulders and closely monitor skin. Will need to place pt on hold if redness worsens and pt feels skin pulling.    Consulted and Agree with Plan of Care  Patient       Patient will benefit from skilled therapeutic intervention in order to improve the following deficits and impairments:  Decreased knowledge of precautions, Decreased knowledge of use of DME, Postural dysfunction, Decreased range of motion, Increased fascial restricitons, Impaired UE functional use, Increased muscle spasms, Impaired perceived functional ability, Pain, Decreased scar mobility, Decreased strength, Increased edema  Visit Diagnosis: 1. Stiffness of right shoulder joint   2. Acute pain of right shoulder   3. Aftercare following surgery for neoplasm   4. Abnormal posture   5. Muscle weakness (generalized)   6. Stiffness of left shoulder, not elsewhere classified        Problem List Patient Active Problem List   Diagnosis Date Noted  . History of right breast cancer 11/14/2018  . Chemotherapy induced neutropenia ([place]) 09/13/2018  . Port-A-Cath in place  06/10/2018  . Malignant neoplasm of upper-outer quadrant of right breast in female, estrogen receptor negative ([place]) 05/09/2018  . Low iron stores 01/16/2018  . Muscle spasm 01/15/2018  . Piriformis syndrome of left side 01/15/2018  . Grade I hemorrhoids 02/23/2017  . Low lying posterior placenta, antepartum 02/01/2016  . AMA (advanced maternal age) multigravida 35+ 11/22/2015  . 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 Calcification of right breast 02/19/2015  . Ligament tear 07/05/2012    Debbe Bales,  Luther Bradley, PTA 01/20/2019, 2:17 PM  [place], [place], [address] Phone: [phone]   Fax:  [phone]  Name: Darlene Grant MRN: [id number] Date of Birth: [birth year]

## 2019-01-21 ENCOUNTER — Other Ambulatory Visit: Payer: Self-pay

## 2019-01-21 ENCOUNTER — Ambulatory Visit: Payer: BC Managed Care – PPO

## 2019-01-21 ENCOUNTER — Ambulatory Visit
Admission: RE | Admit: 2019-01-21 | Discharge: 2019-01-21 | Disposition: A | Discharge status: Home / Self Care | Payer: BC Managed Care – PPO | Source: Ambulatory Visit | Attending: Radiation Oncology | Admitting: Radiation Oncology

## 2019-01-21 ENCOUNTER — Ambulatory Visit: Payer: BC Managed Care – PPO | Admitting: Radiation Oncology

## 2019-01-21 DIAGNOSIS — Z171 Estrogen receptor negative status [ER-]: Secondary | ICD-10-CM

## 2019-01-21 DIAGNOSIS — Z9013 Acquired absence of bilateral breasts and nipples: Secondary | ICD-10-CM | POA: Diagnosis not present

## 2019-01-21 DIAGNOSIS — Z510 Encounter for antineoplastic radiation therapy: Secondary | ICD-10-CM | POA: Diagnosis not present

## 2019-01-21 DIAGNOSIS — Z9221 Personal history of antineoplastic chemotherapy: Secondary | ICD-10-CM | POA: Diagnosis not present

## 2019-01-21 DIAGNOSIS — C50411 Malignant neoplasm of upper-outer quadrant of right female breast: Secondary | ICD-10-CM

## 2019-01-21 DIAGNOSIS — Z853 Personal history of malignant neoplasm of breast: Secondary | ICD-10-CM

## 2019-01-21 MED ORDER — RADIAPLEXRX EX GEL
Freq: Once | CUTANEOUS | Status: AC
  Administered 2019-01-21: 17:00:00 via TOPICAL

## 2019-01-21 MED ORDER — ALRA NON-METALLIC DEODORANT (RAD-ONC)
1.0000 "application " | Freq: Once | TOPICAL | Status: AC
  Administered 2019-01-21: 1 via TOPICAL

## 2019-01-21 NOTE — Progress Notes (Signed)
Simulation note The patient was brought to the treatment room for simulation for the patient's upcoming electron treatment. The patient was setup in the treatment position and the target region was delineated. The patient will receive treatment to the right  chest wall using an en face electron field. One customized block/complex treatment device has been constructed for this purpose, and this will be used on a daily basis during the patient's treatment. After appropriate set up was confirmed, skin markings were placed to allow accurate targeting of the treatment area during the patient's course of therapy.  ------------------------------------------------ -----------------------------------  Blair Promise, PhD, MD

## 2019-01-22 ENCOUNTER — Ambulatory Visit: Payer: BC Managed Care – PPO

## 2019-01-22 ENCOUNTER — Ambulatory Visit
Admission: RE | Admit: 2019-01-22 | Discharge: 2019-01-22 | Disposition: A | Discharge status: Home / Self Care | Payer: BC Managed Care – PPO | Source: Ambulatory Visit | Attending: Radiation Oncology | Admitting: Radiation Oncology

## 2019-01-22 ENCOUNTER — Other Ambulatory Visit: Payer: Self-pay

## 2019-01-22 ENCOUNTER — Inpatient Hospital Stay: Payer: BC Managed Care – PPO | Attending: Oncology

## 2019-01-22 DIAGNOSIS — Z853 Personal history of malignant neoplasm of breast: Secondary | ICD-10-CM | POA: Insufficient documentation

## 2019-01-22 DIAGNOSIS — M6281 Muscle weakness (generalized): Secondary | ICD-10-CM | POA: Diagnosis not present

## 2019-01-22 DIAGNOSIS — R293 Abnormal posture: Secondary | ICD-10-CM

## 2019-01-22 DIAGNOSIS — M25611 Stiffness of right shoulder, not elsewhere classified: Secondary | ICD-10-CM

## 2019-01-22 DIAGNOSIS — M25612 Stiffness of left shoulder, not elsewhere classified: Secondary | ICD-10-CM | POA: Diagnosis not present

## 2019-01-22 DIAGNOSIS — C50411 Malignant neoplasm of upper-outer quadrant of right female breast: Secondary | ICD-10-CM | POA: Diagnosis not present

## 2019-01-22 DIAGNOSIS — Z9013 Acquired absence of bilateral breasts and nipples: Secondary | ICD-10-CM | POA: Diagnosis not present

## 2019-01-22 DIAGNOSIS — Z510 Encounter for antineoplastic radiation therapy: Secondary | ICD-10-CM | POA: Diagnosis not present

## 2019-01-22 DIAGNOSIS — M25511 Pain in right shoulder: Secondary | ICD-10-CM

## 2019-01-22 DIAGNOSIS — Z0000 Encounter for general adult medical examination without abnormal findings: Secondary | ICD-10-CM | POA: Diagnosis not present

## 2019-01-22 DIAGNOSIS — Z9221 Personal history of antineoplastic chemotherapy: Secondary | ICD-10-CM | POA: Diagnosis not present

## 2019-01-22 DIAGNOSIS — Z171 Estrogen receptor negative status [ER-]: Secondary | ICD-10-CM

## 2019-01-22 DIAGNOSIS — Z483 Aftercare following surgery for neoplasm: Secondary | ICD-10-CM

## 2019-01-22 LAB — CBC WITH DIFFERENTIAL (CANCER CENTER ONLY)
Abs Immature Granulocytes: 0.01 10*3/uL (ref 0.00–0.07)
Basophils Absolute: 0 10*3/uL (ref 0.0–0.1)
Basophils Relative: 0 %
Eosinophils Absolute: 0 10*3/uL (ref 0.0–0.5)
Eosinophils Relative: 1 %
HCT: 36.3 % (ref 36.0–46.0)
Hemoglobin: 12.1 g/dL (ref 12.0–15.0)
Immature Granulocytes: 0 %
Lymphocytes Relative: 18 %
Lymphs Abs: 0.6 10*3/uL — ABNORMAL LOW (ref 0.7–4.0)
MCH: 33.6 pg (ref 26.0–34.0)
MCHC: 33.3 g/dL (ref 30.0–36.0)
MCV: 100.8 fL — ABNORMAL HIGH (ref 80.0–100.0)
Monocytes Absolute: 0.3 10*3/uL (ref 0.1–1.0)
Monocytes Relative: 10 %
Neutro Abs: 2.2 10*3/uL (ref 1.7–7.7)
Neutrophils Relative %: 71 %
Platelet Count: 193 10*3/uL (ref 150–400)
RBC: 3.6 MIL/uL — ABNORMAL LOW (ref 3.87–5.11)
RDW: 11.9 % (ref 11.5–15.5)
WBC Count: 3.1 10*3/uL — ABNORMAL LOW (ref 4.0–10.5)
nRBC: 0 % (ref 0.0–0.2)

## 2019-01-22 LAB — CMP (CANCER CENTER ONLY)
ALT: 12 U/L (ref 0–44)
AST: 15 U/L (ref 15–41)
Albumin: 4.8 g/dL (ref 3.5–5.0)
Alkaline Phosphatase: 70 U/L (ref 38–126)
Anion gap: 10 (ref 5–15)
BUN: 13 mg/dL (ref 6–20)
CO2: 26 mmol/L (ref 22–32)
Calcium: 10 mg/dL (ref 8.9–10.3)
Chloride: 103 mmol/L (ref 98–111)
Creatinine: 0.78 mg/dL (ref 0.44–1.00)
GFR, Est AFR Am: >60 mL/min (ref >60)
GFR, Estimated: >60 mL/min (ref >60)
Glucose, Bld: 93 mg/dL (ref 70–99)
Potassium: 4.1 mmol/L (ref 3.5–5.1)
Sodium: 139 mmol/L (ref 135–145)
Total Bilirubin: 1.6 mg/dL — ABNORMAL HIGH (ref 0.3–1.2)
Total Protein: 7.4 g/dL (ref 6.5–8.1)

## 2019-01-22 NOTE — Therapy (Signed)
[place], [place], [address] Phone: [phone]   Fax:  [phone]  Physical Therapy Treatment  Patient Details  Name: Darlene Grant MRN: [id number] Date of Birth: 04-16-1976 Referring Provider (PT): [place]    Encounter Date: 01/22/2019  PT End of Session - 01/22/19 1054    Visit Number  12    Number of Visits  21    Date for PT Re-Evaluation  02/26/19    PT Start Time  1007    PT Stop Time  1052    PT Time Calculation (min)  45 min    Activity Tolerance  Patient tolerated treatment well    Behavior During Therapy  [facility] for tasks assessed/performed       Past Medical History:  Diagnosis Date  . Abnormal Pap smear    ASCUS 03/13/11  . Anemia   . Breast pain, left   . Cancer ([place])   . Candida vaginitis   . Complication of anesthesia    pt states that all narcotics make her angry and she would prefer to avoid them.  . Rectal itching   . Vaginal Pap smear, abnormal     Past Surgical History:  Procedure Laterality Date  . BREAST BIOPSY Right 8/16  . CESAREAN SECTION N/A 11/21/2013   Procedure: CESAREAN SECTION;  Surgeon: Alwyn Pea, MD;  Location: [facility];  Service: Obstetrics;  Laterality: N/A;  . CESAREAN SECTION N/A 06/23/2016   Procedure: CESAREAN SECTION;  Surgeon: Emily Filbert, MD;  Location: [place];  Service: Obstetrics;  Laterality: N/A;  . HAND SURGERY  04/2012   ligament repair  . MASTECTOMY WITH AXILLARY LYMPH NODE DISSECTION Bilateral 11/14/2018   Procedure: RIGHT MASTECTOMY WITH LEFT AXILLARY LYMPH NODE DISSECTION AND LEFT RISK REDUCING MASTECTOMY;  Surgeon: Rolm Bookbinder, MD;  Location: [place];  Service: General;  Laterality: Bilateral;  . PILONIDAL CYST EXCISION  2000  . PORTACATH PLACEMENT N/A 05/28/2018   Procedure: INSERTION PORT-A-CATH WITH ULTRASOUND;  Surgeon: Rolm Bookbinder, MD;  Location: [facility];   Service: General;  Laterality: N/A;  . WISDOM TOOTH EXTRACTION  AGE 43    There were no vitals filed for this visit.  Subjective Assessment - 01/22/19 1012    Subjective  My skin is definitely becoming more itchy.    Pertinent History  breast cancer diagnosed November 2019: triple negative with Ki of 75% She completed chemotherapy 10/18/2018. 11/14/2018: she underwent bilateral mastectomy with 0/13 nodes positive on the right    Patient Stated Goals  to learn what she needs to know for exercises post op and to hopefully avoid lymphedema     Currently in Pain?  No/denies                       [facility] Adult PT Treatment/Exercise - 01/22/19 0001      Manual Therapy   Myofascial Release  to cording in Rt axilla extending in to chest where pt has increased tightness being mindful of irradiated tissue, pt reports no skin pulling    Manual Lymphatic Drainage (MLD)  --    Passive ROM  to Rt shoulder in direction of flexion, abduction and D2 with prolonged holds today while performing scapular depression throughout at superior aspect of shoulder                  PT Long Term Goals - 01/15/19 0240  PT LONG TERM GOAL #1   Title  Pt will have 150 degrees of painfree right shoulder abduction so that she can perform her household chores easier    Baseline  108 with pain; 164 degrees without pain-01/15/19    Status  Achieved      PT LONG TERM GOAL #2   Title  Pt will report 50% decrease in right shoulder tightness and pulling from axillary cording    Baseline  75% reported at this time-01/15/19    Status  Achieved      PT LONG TERM GOAL #3   Title  Pt will be independent in a home exercise program for shoulder ROM and strength    Status  On-going      PT LONG TERM GOAL #4   Title  Pt to report less tightness with OH reaching by at least 50% at Lt axilla.    Status  New            Plan - 01/22/19 1055    Clinical Impression Statement  Pts P/ROM of Rt shoulder  today was excellent and near end ROM. Focused on Rt shoulder today as she is reporting feeling increased tightness from radiation. Also was mindful of pt feeling pull at skin and none was reported throughout.    Personal Factors and Comorbidities  Comorbidity 1    Comorbidities  recent chemo    Examination-Activity Limitations  Bathing;Reach Overhead;Caring for Others;Lift;Hygiene/Grooming    Stability/Clinical Decision Making  Evolving/Moderate complexity    Rehab Potential  Good    PT Frequency  2x / week    PT Duration  4 weeks    PT Treatment/Interventions  ADLs/Self Care Home Management;Therapeutic exercise;Manual lymph drainage;Manual techniques    PT Next Visit Plan  Cont focusing on end ROM for bil shoulders and closely monitor skin at Rt chest wall. Will need to place pt on hold if redness worsens and pt feels skin pulling, or just stop treatment for Rt continuing with Lt prn    Consulted and Agree with Plan of Care  Patient       Patient will benefit from skilled therapeutic intervention in order to improve the following deficits and impairments:  Decreased knowledge of precautions, Decreased knowledge of use of DME, Postural dysfunction, Decreased range of motion, Increased fascial restricitons, Impaired UE functional use, Increased muscle spasms, Impaired perceived functional ability, Pain, Decreased scar mobility, Decreased strength, Increased edema  Visit Diagnosis: 1. Stiffness of right shoulder joint   2. Acute pain of right shoulder   3. Aftercare following surgery for neoplasm   4. Abnormal posture   5. Muscle weakness (generalized)   6. Stiffness of left shoulder, not elsewhere classified        Problem List Patient Active Problem List   Diagnosis Date Noted  . History of right breast cancer 11/14/2018  . Chemotherapy induced neutropenia ([place]) 09/13/2018  . Port-A-Cath in place 06/10/2018  . Malignant neoplasm of upper-outer quadrant of right breast in female,  estrogen receptor negative ([place]) 05/09/2018  . Low iron stores 01/16/2018  . Muscle spasm 01/15/2018  . Piriformis syndrome of left side 01/15/2018  . Grade I hemorrhoids 02/23/2017  . Low lying posterior placenta, antepartum 02/01/2016  . AMA (advanced maternal age) multigravida 35+ 11/22/2015  . 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 Calcification of right breast 02/19/2015  . Ligament tear 07/05/2012    Otelia Limes, PTA 01/22/2019, 10:57 AM  [place] [phone]  [place], [place], [address] Phone: [phone]   Fax:  [phone]  Name: DELANEY PERONA MRN: [id number] Date of Birth: Jul 25, 1975

## 2019-01-23 ENCOUNTER — Ambulatory Visit: Payer: BC Managed Care – PPO

## 2019-01-23 ENCOUNTER — Ambulatory Visit
Admission: RE | Admit: 2019-01-23 | Discharge: 2019-01-23 | Disposition: A | Discharge status: Home / Self Care | Payer: BC Managed Care – PPO | Source: Ambulatory Visit | Attending: Radiation Oncology | Admitting: Radiation Oncology

## 2019-01-23 DIAGNOSIS — Z171 Estrogen receptor negative status [ER-]: Secondary | ICD-10-CM | POA: Diagnosis not present

## 2019-01-23 DIAGNOSIS — Z510 Encounter for antineoplastic radiation therapy: Secondary | ICD-10-CM | POA: Diagnosis not present

## 2019-01-23 DIAGNOSIS — Z9221 Personal history of antineoplastic chemotherapy: Secondary | ICD-10-CM | POA: Diagnosis not present

## 2019-01-23 DIAGNOSIS — Z9013 Acquired absence of bilateral breasts and nipples: Secondary | ICD-10-CM | POA: Diagnosis not present

## 2019-01-23 DIAGNOSIS — C50411 Malignant neoplasm of upper-outer quadrant of right female breast: Secondary | ICD-10-CM | POA: Diagnosis not present

## 2019-01-23 LAB — LIPID PANEL
Chol/HDL Ratio: 2 ratio (ref 0.0–4.4)
Cholesterol, Total: 168 mg/dL (ref 100–199)
HDL: 83 mg/dL (ref >39)
LDL Calculated: 76 mg/dL (ref 0–99)
Triglycerides: 47 mg/dL (ref 0–149)
VLDL Cholesterol Cal: 9 mg/dL (ref 5–40)

## 2019-01-23 LAB — SPECIMEN STATUS REPORT

## 2019-01-23 LAB — TSH: TSH: 0.787 u[IU]/mL (ref 0.450–4.500)

## 2019-01-24 ENCOUNTER — Ambulatory Visit: Payer: BC Managed Care – PPO

## 2019-01-24 ENCOUNTER — Ambulatory Visit
Admission: RE | Admit: 2019-01-24 | Discharge: 2019-01-24 | Disposition: A | Discharge status: Home / Self Care | Payer: BC Managed Care – PPO | Source: Ambulatory Visit | Attending: Radiation Oncology | Admitting: Radiation Oncology

## 2019-01-24 ENCOUNTER — Other Ambulatory Visit: Payer: Self-pay

## 2019-01-24 DIAGNOSIS — Z9221 Personal history of antineoplastic chemotherapy: Secondary | ICD-10-CM | POA: Diagnosis not present

## 2019-01-24 DIAGNOSIS — Z510 Encounter for antineoplastic radiation therapy: Secondary | ICD-10-CM | POA: Diagnosis not present

## 2019-01-24 DIAGNOSIS — Z171 Estrogen receptor negative status [ER-]: Secondary | ICD-10-CM | POA: Diagnosis not present

## 2019-01-24 DIAGNOSIS — C50411 Malignant neoplasm of upper-outer quadrant of right female breast: Secondary | ICD-10-CM | POA: Diagnosis not present

## 2019-01-24 DIAGNOSIS — Z9013 Acquired absence of bilateral breasts and nipples: Secondary | ICD-10-CM | POA: Diagnosis not present

## 2019-01-27 ENCOUNTER — Ambulatory Visit (INDEPENDENT_AMBULATORY_CARE_PROVIDER_SITE_OTHER): Payer: BC Managed Care – PPO | Admitting: Obstetrics & Gynecology

## 2019-01-27 ENCOUNTER — Ambulatory Visit: Payer: BC Managed Care – PPO

## 2019-01-27 ENCOUNTER — Encounter: Payer: Self-pay | Admitting: *Deleted

## 2019-01-27 ENCOUNTER — Encounter: Payer: Self-pay | Admitting: Obstetrics & Gynecology

## 2019-01-27 ENCOUNTER — Ambulatory Visit
Admission: RE | Admit: 2019-01-27 | Discharge: 2019-01-27 | Disposition: A | Discharge status: Home / Self Care | Payer: BC Managed Care – PPO | Source: Ambulatory Visit | Attending: Radiation Oncology | Admitting: Radiation Oncology

## 2019-01-27 ENCOUNTER — Other Ambulatory Visit: Payer: Self-pay

## 2019-01-27 VITALS — BP 104/68 | HR 72 | Resp 16 | Ht 64.5 in | Wt 146.0 lb

## 2019-01-27 DIAGNOSIS — Z510 Encounter for antineoplastic radiation therapy: Secondary | ICD-10-CM | POA: Diagnosis not present

## 2019-01-27 DIAGNOSIS — N912 Amenorrhea, unspecified: Secondary | ICD-10-CM | POA: Diagnosis not present

## 2019-01-27 DIAGNOSIS — Z1151 Encounter for screening for human papillomavirus (HPV): Secondary | ICD-10-CM | POA: Diagnosis not present

## 2019-01-27 DIAGNOSIS — Z124 Encounter for screening for malignant neoplasm of cervix: Secondary | ICD-10-CM | POA: Diagnosis not present

## 2019-01-27 DIAGNOSIS — C50411 Malignant neoplasm of upper-outer quadrant of right female breast: Secondary | ICD-10-CM | POA: Diagnosis not present

## 2019-01-27 DIAGNOSIS — Z9221 Personal history of antineoplastic chemotherapy: Secondary | ICD-10-CM | POA: Diagnosis not present

## 2019-01-27 DIAGNOSIS — Z01419 Encounter for gynecological examination (general) (routine) without abnormal findings: Secondary | ICD-10-CM

## 2019-01-27 DIAGNOSIS — M25552 Pain in left hip: Secondary | ICD-10-CM

## 2019-01-27 DIAGNOSIS — Z0000 Encounter for general adult medical examination without abnormal findings: Secondary | ICD-10-CM

## 2019-01-27 DIAGNOSIS — Z9013 Acquired absence of bilateral breasts and nipples: Secondary | ICD-10-CM | POA: Diagnosis not present

## 2019-01-27 DIAGNOSIS — Z171 Estrogen receptor negative status [ER-]: Secondary | ICD-10-CM | POA: Diagnosis not present

## 2019-01-27 NOTE — Progress Notes (Signed)
Subjective:    Darlene Grant is a 43 y.o. married P2 (2 and 26 yo kids) female who presents for an annual exam. She has not had a period since 1/20, finished chemo and now with 20 of 30 RT. She has left hip tightness since RLTCS in 2018.  The patient is sexually active, uses lubricant. She plans to get an appt with a pelvic floor therapist to help with some pain with sex.  GYN screening history: last pap: was normal. The patient wears seatbelts: yes. The patient participates in regular exercise: yes. Has the patient ever been transfused or tattooed?: no. The patient reports that there is not domestic violence in her life.   Menstrual History: OB History    Gravida  2   Para  2   Term  2   Preterm      AB      Living  2     SAB      TAB      Ectopic      Multiple  0   Live Births  2           Menarche age: 50 No LMP recorded. (Menstrual status: Chemotherapy).    The following portions of the patient's history were reviewed and updated as appropriate: allergies, current medications, past family history, past medical history, past social history, past surgical history and problem list.  Review of Systems Pertinent items are noted in HPI.   Husband s/p vasectomy Married for 72 years FH- + mom with breast cancer, no gyn or colon cancer Homemaker now, previously did virtual OT  Objective:    BP 104/68   Pulse 72   Resp 16   Ht 5' 4.5" (1.638 m)   Wt 146 lb (66.2 kg)   BMI 24.67 kg/m   General Appearance:    Alert, cooperative, no distress, appears stated age  Head:    Normocephalic, without obvious abnormality, atraumatic  Eyes:    PERRL, conjunctiva/corneas clear, EOM's intact, fundi    benign, both eyes  Ears:    Normal TM's and external ear canals, both ears  Nose:   Nares normal, septum midline, mucosa normal, no drainage    or sinus tenderness  Throat:   Lips, mucosa, and tongue normal; teeth and gums normal  Neck:   Supple, symmetrical,  trachea midline, no adenopathy;    thyroid:  no enlargement/tenderness/nodules; no carotid   bruit or JVD  Back:     Symmetric, no curvature, ROM normal, no CVA tenderness  Lungs:     Clear to auscultation bilaterally, respirations unlabored  Chest Wall:    No tenderness or deformity   Heart:    Regular rate and rhythm, S1 and S2 normal, no murmur, rub   or gallop  Breast Exam:    No tenderness, masses, or nipple abnormality  Abdomen:     Soft, non-tender, bowel sounds active all four quadrants,    no masses, no organomegaly  Genitalia:    Normal female without lesion, discharge or tenderness, normal size and shape, retroverted, mobile, non-tender, normal adnexal exam      Extremities:   Extremities normal, atraumatic, no cyanosis or edema  Pulses:   2+ and symmetric all extremities  Skin:   Skin color, texture, turgor normal, no rashes or lesions  Lymph nodes:   Cervical, supraclavicular, and axillary nodes normal  Neurologic:   CNII-XII intact, normal strength, sensation and reflexes    throughout  .  Assessment:    Healthy female exam.   Left hip pain   Plan:     Thin prep Pap smear. with cotesting PT prn (order placed) Check [facility]

## 2019-01-28 ENCOUNTER — Ambulatory Visit
Admission: RE | Admit: 2019-01-28 | Discharge: 2019-01-28 | Disposition: A | Discharge status: Home / Self Care | Payer: BC Managed Care – PPO | Source: Ambulatory Visit | Attending: Radiation Oncology | Admitting: Radiation Oncology

## 2019-01-28 ENCOUNTER — Ambulatory Visit: Payer: BC Managed Care – PPO | Admitting: Radiation Oncology

## 2019-01-28 ENCOUNTER — Ambulatory Visit: Payer: BC Managed Care – PPO | Admitting: Physical Therapy

## 2019-01-28 ENCOUNTER — Ambulatory Visit: Payer: BC Managed Care – PPO

## 2019-01-28 ENCOUNTER — Other Ambulatory Visit: Payer: Self-pay

## 2019-01-28 DIAGNOSIS — C50411 Malignant neoplasm of upper-outer quadrant of right female breast: Secondary | ICD-10-CM

## 2019-01-28 DIAGNOSIS — Z171 Estrogen receptor negative status [ER-]: Secondary | ICD-10-CM | POA: Diagnosis not present

## 2019-01-28 DIAGNOSIS — Z9013 Acquired absence of bilateral breasts and nipples: Secondary | ICD-10-CM | POA: Diagnosis not present

## 2019-01-28 DIAGNOSIS — Z9221 Personal history of antineoplastic chemotherapy: Secondary | ICD-10-CM | POA: Diagnosis not present

## 2019-01-28 DIAGNOSIS — Z510 Encounter for antineoplastic radiation therapy: Secondary | ICD-10-CM | POA: Diagnosis not present

## 2019-01-28 LAB — CYTOLOGY - PAP
Diagnosis: NEGATIVE
HPV: NOT DETECTED

## 2019-01-28 LAB — FOLLICLE STIMULATING HORMONE: FSH: 52 m[IU]/mL

## 2019-01-28 MED ORDER — SONAFINE EX EMUL
1.0000 "application " | Freq: Two times a day (BID) | CUTANEOUS | Status: DC
  Administered 2019-01-28: 1 via TOPICAL

## 2019-01-28 MED ORDER — ALRA NON-METALLIC DEODORANT (RAD-ONC)
1.0000 "application " | Freq: Once | TOPICAL | Status: AC
  Administered 2019-01-28: 1 via TOPICAL

## 2019-01-28 NOTE — Progress Notes (Signed)
.  Simulation verification  The patient was brought to the treatment machine and placed in the plan treatment position.  Clinical set up was verified to ensure that the target region is appropriately covered for the patient's upcoming electron boost treatment.  The targeted volume of tissue is appropriately covered by the radiation field.  Based on my personal review, I approve the simulation verification.  The patient's treatment will proceed as planned.  ------------------------------------------------  -----------------------------------  Kristain Filo D. Dresean Beckel, PhD, MD  

## 2019-01-29 ENCOUNTER — Ambulatory Visit
Admission: RE | Admit: 2019-01-29 | Discharge: 2019-01-29 | Disposition: A | Discharge status: Home / Self Care | Payer: BC Managed Care – PPO | Source: Ambulatory Visit | Attending: Radiation Oncology | Admitting: Radiation Oncology

## 2019-01-29 ENCOUNTER — Ambulatory Visit: Payer: BC Managed Care – PPO

## 2019-01-29 ENCOUNTER — Other Ambulatory Visit: Payer: Self-pay

## 2019-01-29 DIAGNOSIS — Z171 Estrogen receptor negative status [ER-]: Secondary | ICD-10-CM | POA: Diagnosis not present

## 2019-01-29 DIAGNOSIS — Z510 Encounter for antineoplastic radiation therapy: Secondary | ICD-10-CM | POA: Diagnosis not present

## 2019-01-29 DIAGNOSIS — Z9221 Personal history of antineoplastic chemotherapy: Secondary | ICD-10-CM | POA: Diagnosis not present

## 2019-01-29 DIAGNOSIS — Z9013 Acquired absence of bilateral breasts and nipples: Secondary | ICD-10-CM | POA: Diagnosis not present

## 2019-01-29 DIAGNOSIS — C50411 Malignant neoplasm of upper-outer quadrant of right female breast: Secondary | ICD-10-CM | POA: Diagnosis not present

## 2019-01-30 ENCOUNTER — Ambulatory Visit: Payer: BC Managed Care – PPO

## 2019-01-30 ENCOUNTER — Encounter: Payer: BC Managed Care – PPO | Admitting: Physical Therapy

## 2019-01-30 ENCOUNTER — Ambulatory Visit
Admission: RE | Admit: 2019-01-30 | Discharge: 2019-01-30 | Disposition: A | Discharge status: Home / Self Care | Payer: BC Managed Care – PPO | Source: Ambulatory Visit | Attending: Radiation Oncology | Admitting: Radiation Oncology

## 2019-01-30 ENCOUNTER — Other Ambulatory Visit: Payer: Self-pay

## 2019-01-30 DIAGNOSIS — Z9221 Personal history of antineoplastic chemotherapy: Secondary | ICD-10-CM | POA: Diagnosis not present

## 2019-01-30 DIAGNOSIS — C50411 Malignant neoplasm of upper-outer quadrant of right female breast: Secondary | ICD-10-CM | POA: Diagnosis not present

## 2019-01-30 DIAGNOSIS — Z510 Encounter for antineoplastic radiation therapy: Secondary | ICD-10-CM | POA: Diagnosis not present

## 2019-01-30 DIAGNOSIS — Z171 Estrogen receptor negative status [ER-]: Secondary | ICD-10-CM | POA: Diagnosis not present

## 2019-01-30 DIAGNOSIS — Z9013 Acquired absence of bilateral breasts and nipples: Secondary | ICD-10-CM | POA: Diagnosis not present

## 2019-01-31 ENCOUNTER — Other Ambulatory Visit: Payer: Self-pay

## 2019-01-31 ENCOUNTER — Ambulatory Visit
Admission: RE | Admit: 2019-01-31 | Discharge: 2019-01-31 | Disposition: A | Discharge status: Home / Self Care | Payer: BC Managed Care – PPO | Source: Ambulatory Visit | Attending: Radiation Oncology | Admitting: Radiation Oncology

## 2019-01-31 ENCOUNTER — Ambulatory Visit: Payer: BC Managed Care – PPO

## 2019-01-31 DIAGNOSIS — Z510 Encounter for antineoplastic radiation therapy: Secondary | ICD-10-CM | POA: Diagnosis not present

## 2019-01-31 DIAGNOSIS — Z9013 Acquired absence of bilateral breasts and nipples: Secondary | ICD-10-CM | POA: Diagnosis not present

## 2019-01-31 DIAGNOSIS — Z171 Estrogen receptor negative status [ER-]: Secondary | ICD-10-CM | POA: Diagnosis not present

## 2019-01-31 DIAGNOSIS — C50411 Malignant neoplasm of upper-outer quadrant of right female breast: Secondary | ICD-10-CM | POA: Diagnosis not present

## 2019-01-31 DIAGNOSIS — Z9221 Personal history of antineoplastic chemotherapy: Secondary | ICD-10-CM | POA: Diagnosis not present

## 2019-02-03 ENCOUNTER — Ambulatory Visit: Payer: BC Managed Care – PPO | Admitting: Hematology and Oncology

## 2019-02-03 ENCOUNTER — Ambulatory Visit: Payer: BC Managed Care – PPO

## 2019-02-03 ENCOUNTER — Other Ambulatory Visit: Payer: Self-pay

## 2019-02-03 ENCOUNTER — Ambulatory Visit
Admission: RE | Admit: 2019-02-03 | Discharge: 2019-02-03 | Disposition: A | Discharge status: Home / Self Care | Payer: BC Managed Care – PPO | Source: Ambulatory Visit | Attending: Radiation Oncology | Admitting: Radiation Oncology

## 2019-02-03 ENCOUNTER — Encounter: Payer: Self-pay | Admitting: Physical Therapy

## 2019-02-03 ENCOUNTER — Ambulatory Visit: Payer: BC Managed Care – PPO | Attending: Hematology and Oncology | Admitting: Physical Therapy

## 2019-02-03 DIAGNOSIS — M25511 Pain in right shoulder: Secondary | ICD-10-CM | POA: Insufficient documentation

## 2019-02-03 DIAGNOSIS — M6281 Muscle weakness (generalized): Secondary | ICD-10-CM | POA: Insufficient documentation

## 2019-02-03 DIAGNOSIS — Z9221 Personal history of antineoplastic chemotherapy: Secondary | ICD-10-CM | POA: Diagnosis not present

## 2019-02-03 DIAGNOSIS — R293 Abnormal posture: Secondary | ICD-10-CM | POA: Diagnosis not present

## 2019-02-03 DIAGNOSIS — Z171 Estrogen receptor negative status [ER-]: Secondary | ICD-10-CM | POA: Diagnosis not present

## 2019-02-03 DIAGNOSIS — M25611 Stiffness of right shoulder, not elsewhere classified: Secondary | ICD-10-CM | POA: Diagnosis not present

## 2019-02-03 DIAGNOSIS — Z483 Aftercare following surgery for neoplasm: Secondary | ICD-10-CM | POA: Diagnosis not present

## 2019-02-03 DIAGNOSIS — M25552 Pain in left hip: Secondary | ICD-10-CM | POA: Diagnosis not present

## 2019-02-03 DIAGNOSIS — Z9013 Acquired absence of bilateral breasts and nipples: Secondary | ICD-10-CM | POA: Diagnosis not present

## 2019-02-03 DIAGNOSIS — Z510 Encounter for antineoplastic radiation therapy: Secondary | ICD-10-CM | POA: Diagnosis not present

## 2019-02-03 DIAGNOSIS — M25612 Stiffness of left shoulder, not elsewhere classified: Secondary | ICD-10-CM | POA: Diagnosis not present

## 2019-02-03 DIAGNOSIS — C50411 Malignant neoplasm of upper-outer quadrant of right female breast: Secondary | ICD-10-CM | POA: Diagnosis not present

## 2019-02-03 NOTE — Assessment & Plan Note (Deleted)
05/03/2018: right breast upper outer quadrant and right axillary lymph node biopsy 05/03/2018 for a clinical T2 N2, stage IIIC invasive ductal carcinoma,triple negative, with an Ki-67 75%, no deleterious genetic mutations  06/03/2018-10/18/2018: Neoadjuvant chemotherapy with dose dense Adriamycin and Cytoxan followed by Taxol and carboplatin  11/14/2018: Bilateral mastectomies: Left mastectomy: Fibrocystic changes, right mastectomy: Complete pathologic response no evidence of residual cancer, 0/13 lymph nodes negative  Treatment plan: Adjuvant radiation started 12/31/2018: Followed by surveillance. There is no advantage of Xeloda or S14 18 clinical trial because she had a pathologic complete response  Anemia: Resolved with hemoglobin of 12.1 on 01/22/2019. 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 We will perform CT abdomen and pelvis and chest and a bone scan in December 2020 and follow-up after that. Return to clinic for survivorship care plan visit after the scans in December.  

## 2019-02-03 NOTE — Therapy (Signed)
[facility] [address] [place], [place], [address] Phone: [phone]   Fax:  [phone]  Physical Therapy Evaluation  Patient Details  Name: Darlene Grant MRN: [id number] Date of Birth: 1975-09-27 Referring Provider (PT): Dr. Nicholas Lose; Dr. Clovia Cuff   Encounter Date: 02/03/2019  PT End of Session - 02/03/19 1247    Visit Number  1   1 pelvis; 12 breast   Number of Visits  21    Date for PT Re-Evaluation  04/28/19   breast 02/26/19   PT Start Time  1115    PT Stop Time  1200    PT Time Calculation (min)  45 min    Activity Tolerance  Patient tolerated treatment well;No increased pain    Behavior During Therapy  WFL for tasks assessed/performed       Past Medical History:  Diagnosis Date  . Abnormal Pap smear    ASCUS 03/13/11  . Anemia   . Breast pain, left   . Cancer ([place])   . Candida vaginitis   . Complication of anesthesia    pt states that all narcotics make her angry and she would prefer to avoid them.  . Rectal itching   . Vaginal Pap smear, abnormal     Past Surgical History:  Procedure Laterality Date  . BREAST BIOPSY Right 8/16  . CESAREAN SECTION N/A 11/21/2013   Procedure: CESAREAN SECTION;  Surgeon: Alwyn Pea, MD;  Location: [facility];  Service: Obstetrics;  Laterality: N/A;  . CESAREAN SECTION N/A 06/23/2016   Procedure: CESAREAN SECTION;  Surgeon: Emily Filbert, MD;  Location: [place];  Service: Obstetrics;  Laterality: N/A;  . HAND SURGERY  04/2012   ligament repair  . MASTECTOMY WITH AXILLARY LYMPH NODE DISSECTION Bilateral 11/14/2018   Procedure: RIGHT MASTECTOMY WITH LEFT AXILLARY LYMPH NODE DISSECTION AND LEFT RISK REDUCING MASTECTOMY;  Surgeon: Rolm Bookbinder, MD;  Location: [facility];  Service: General;  Laterality: Bilateral;  . PILONIDAL CYST EXCISION  2000  . PORTACATH PLACEMENT N/A 05/28/2018   Procedure: INSERTION PORT-A-CATH WITH  ULTRASOUND;  Surgeon: Rolm Bookbinder, MD;  Location: [facility];  Service: General;  Laterality: N/A;  . WISDOM TOOTH EXTRACTION  AGE 47    There were no vitals filed for this visit.   Subjective Assessment - 02/03/19 1121    Subjective  Patient reports she is having sex with her husband. Initially has discomfort initially. Patient is post menopausal. Dryness. No urinary leakage. Left hip pain began 07/24/2016 when she gave birth to her daughter.    Pertinent History  breast cancer diagnosed November 2019: triple negative with Ki of 75% She completed chemotherapy 10/18/2018. 11/14/2018: she underwent bilateral mastectomy with 0/13 nodes positive on the right    Patient Stated Goals  sleep without pain, exercise without discomfort, carry daughter on left due to radiation on the right breast    Currently in Pain?  Yes    Pain Score  1    goes to 3-4/10   Pain Location  Hip    Pain Orientation  Left    Pain Descriptors / Indicators  Tender   pulling   Pain Type  Chronic pain    Pain Onset  More than a month ago    Pain Frequency  Constant    Aggravating Factors   step on left foot and extend the left hip, laying on left side, carrying daughter on left hip  Pain Relieving Factors  lay on right side,    Multiple Pain Sites  No         [facility] PT Assessment - 02/03/19 0001      Assessment   Medical Diagnosis  M25.552 left hip pain; C50.411,Z17.1 Malignant neoplasm of upper-outer quadrant of right breast in female, estrogen receptor negative    Referring Provider (PT)  Dr. Nicholas Lose; Dr. Clovia Cuff    Onset Date/Surgical Date  06/23/16    Prior Therapy  none      Precautions   Precautions  Other (comment)    Precaution Comments  recent chemo      Restrictions   Weight Bearing Restrictions  No      Balance Screen   Has the patient fallen in the past 6 months  No    Has the patient had a decrease in activity level because of a fear of falling?   No    Is the  patient reluctant to leave their home because of a fear of falling?   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     Leisure  likes to walk and exercise regularly, well educated in exercise       Cognition   Overall Cognitive Status  Within Functional Limits for tasks assessed      Posture/Postural Control   Posture/Postural Control  Postural limitations    Postural Limitations  Increased lumbar lordosis;Decreased thoracic kyphosis      AROM   Right Shoulder Flexion  164 Degrees    Right Shoulder ABduction  167 Degrees    Left Shoulder Flexion  161 Degrees   reports tightness at end ROM   Left Shoulder ABduction  176 Degrees   also reports tightness at end ROM   Lumbar Extension  decreased by 50%    Lumbar - Right Side Bend  decreased by 50%      Strength   Right Hip Internal Rotation  4-/5    Left Hip Flexion  3+/5    Left Hip Extension  4/5    Left Hip Internal Rotation  4-/5    Left Hip ABduction  4/5      Palpation   SI assessment   left ilium posteriorly rotated; sacrum rotated left    Palpation comment  bilateral  iliopsoas tight,                 Objective measurements completed on examination: See above findings.    Pelvic Floor Special Questions - 02/03/19 0001    Currently Sexually Active  Yes    Is this Painful  --   just discomfort due to dryness   Urinary Leakage  No    Urinary urgency  No    Fecal incontinence  No       [facility] Adult PT Treatment/Exercise - 02/03/19 0001      Self-Care   Self-Care  Other Self-Care Comments    Other Self-Care Comments   education on massaging c-section scar to release scar tissue      Manual Therapy   Manual Therapy  Joint mobilization;Muscle Energy Technique    Joint  Mobilization  P-A mobilization to  L4-L5; rotational mobilizaiton to   correct sacrum    Muscle Energy Technique  correct left ilium             PT Education - 02/03/19 1243    Education Details  information on c-section scar massage to release the tissue; information on dry needling    Person(s) Educated  Patient    Methods  Explanation;Demonstration;Handout    Comprehension  Returned demonstration;Verbalized understanding       PT Short Term Goals - 02/03/19 1305      PT SHORT TERM GOAL #1   Title  independent with initial HEP for left hip    Time  4    Period  Weeks    Status  New    Target Date  03/03/19      PT SHORT TERM GOAL #2   Title  understand how to take care of vaginal health to reduce dryness and keep tissue mobility    Time  4    Period  Weeks    Status  New    Target Date  03/03/19        PT Long Term Goals - 02/03/19 1305      PT LONG TERM GOAL #1   Title  Pt will have 150 degrees of painfree right shoulder abduction so that she can perform her household chores easier    Baseline  108 with pain; 164 degrees without pain-01/15/19    Time  4    Period  Weeks    Status  Achieved      PT LONG TERM GOAL #2   Title  Pt will report 50% decrease in right shoulder tightness and pulling from axillary cording    Baseline  75% reported at this time-01/15/19    Time  4    Period  Weeks    Status  Achieved      PT LONG TERM GOAL #3   Title  Pt will be independent in a home exercise program for shoulder ROM and strength    Time  4    Period  Weeks    Status  On-going      PT LONG TERM GOAL #4   Title  Pt to report less tightness with OH reaching by at least 50% at Lt axilla.    Status  New      PT LONG TERM GOAL #5   Title  Patient able to ambulate without left hip pain due to pelvis in correct alignment    Time  12    Period  Weeks    Status  New    Target Date  04/28/19      Additional Long Term Goals   Additional Long Term Goals  Yes      PT  LONG TERM GOAL #6   Title  able to lay on left hip for sleeping with minimal to no pain    Time  12    Period  Weeks    Status  New    Target Date  04/28/19      PT LONG TERM GOAL #7   Title  able to hold her child on the left hip due to the radiation soreness on the right breast with pain minimal to none    Time  12    Period  Weeks    Status  New    Target Date  04/28/19             Plan -  02/03/19 1254    Clinical Impression Statement  Patient is a 43 year old female with left hip pain, vaginal dryness with discomfort with intercourse and right shoulder limitations due to having radiation. Patient left hip pain began when she had her child on 06/23/2016. Left hip pain averages 1-4/10 when she lays on left side, holds child on left side, and walking. Right shoulder tightness is when she has to stretch her arm out to the side and overhead due to the tissue changes from radiation. Patient left ilium is rotated posteriorly and sacrum rotated left. Tenderness located in left iliopsoas. Weakness in left hip and right shoulder. Decreased ROM of right shoulder. Patient is having increased cording in right axilla extending in to chest where patient has increased tightness being mindful of irradiated tissue. Patient will benefit from skilled therapy to improve right shoulder mobility, reduce right hip pain and education on vaginal health.    Personal Factors and Comorbidities  Comorbidity 1;Sex    Comorbidities  recent chemo    Examination-Activity Limitations  Bathing;Reach Overhead;Caring for Others;Lift;Hygiene/Grooming;Locomotion Level;Sleep    Stability/Clinical Decision Making  Evolving/Moderate complexity    Rehab Potential  Good    PT Frequency  1x / week    PT Duration  12 weeks    PT Treatment/Interventions  ADLs/Self Care Home Management;Therapeutic exercise;Manual lymph drainage;Manual techniques;Biofeedback;Electrical Stimulation;Therapeutic activities;Neuromuscular  re-education;Patient/family education;Dry needling;Passive range of motion;Spinal Manipulations    PT Next Visit Plan  Cont focusing on end ROM for bil shoulders and closely monitor skin at Rt chest wall. Will need to place pt on hold if redness worsens and pt feels skin pulling, or just stop treatment for Rt continuing with Lt prn; dry needling to left iliopsoas and left lumbar; see if pelvis in correct alignment; spinal mobs    Consulted and Agree with Plan of Care  Patient       Patient will benefit from skilled therapeutic intervention in order to improve the following deficits and impairments:  Decreased knowledge of precautions, Decreased knowledge of use of DME, Postural dysfunction, Decreased range of motion, Increased fascial restricitons, Impaired UE functional use, Increased muscle spasms, Impaired perceived functional ability, Pain, Decreased scar mobility, Decreased strength, Increased edema, Difficulty walking, Decreased activity tolerance, Decreased mobility  Visit Diagnosis: 1. Pain in left hip   2. Stiffness of right shoulder joint   3. Acute pain of right shoulder   4. Aftercare following surgery for neoplasm   5. Abnormal posture   6. Muscle weakness (generalized)   7. Stiffness of left shoulder, not elsewhere classified        Problem List Patient Active Problem List   Diagnosis Date Noted  . History of right breast cancer 11/14/2018  . Chemotherapy induced neutropenia ([place]) 09/13/2018  . Port-A-Cath in place 06/10/2018  . Malignant neoplasm of upper-outer quadrant of right breast in female, estrogen receptor negative ([place]) 05/09/2018  . Low iron stores 01/16/2018  . Muscle spasm 01/15/2018  . Piriformis syndrome of left side 01/15/2018  . Grade I hemorrhoids 02/23/2017  . Low lying posterior placenta, antepartum 02/01/2016  . AMA (advanced maternal age) multigravida 35+ 11/22/2015  . 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 Calcification of right breast  02/19/2015  . Ligament tear 07/05/2012    Earlie Counts, PT 02/03/19 1:13 PM   [facility] [address] [place], [place], [address] Phone: [phone]   Fax:  [phone]  Name: Darlene Grant MRN: [id number] Date of  Birth: 1975-09-05

## 2019-02-03 NOTE — Patient Instructions (Addendum)

## 2019-02-04 ENCOUNTER — Encounter: Payer: BC Managed Care – PPO | Admitting: Physical Therapy

## 2019-02-04 ENCOUNTER — Ambulatory Visit: Payer: BC Managed Care – PPO

## 2019-02-04 ENCOUNTER — Other Ambulatory Visit: Payer: Self-pay

## 2019-02-04 ENCOUNTER — Ambulatory Visit
Admission: RE | Admit: 2019-02-04 | Discharge: 2019-02-04 | Disposition: A | Discharge status: Home / Self Care | Payer: BC Managed Care – PPO | Source: Ambulatory Visit | Attending: Radiation Oncology | Admitting: Radiation Oncology

## 2019-02-04 DIAGNOSIS — Z9221 Personal history of antineoplastic chemotherapy: Secondary | ICD-10-CM | POA: Diagnosis not present

## 2019-02-04 DIAGNOSIS — Z171 Estrogen receptor negative status [ER-]: Secondary | ICD-10-CM | POA: Diagnosis not present

## 2019-02-04 DIAGNOSIS — C50411 Malignant neoplasm of upper-outer quadrant of right female breast: Secondary | ICD-10-CM | POA: Diagnosis not present

## 2019-02-04 DIAGNOSIS — Z9013 Acquired absence of bilateral breasts and nipples: Secondary | ICD-10-CM | POA: Diagnosis not present

## 2019-02-04 DIAGNOSIS — Z510 Encounter for antineoplastic radiation therapy: Secondary | ICD-10-CM | POA: Diagnosis not present

## 2019-02-05 ENCOUNTER — Ambulatory Visit: Payer: BC Managed Care – PPO

## 2019-02-05 ENCOUNTER — Encounter: Payer: Self-pay | Admitting: *Deleted

## 2019-02-05 ENCOUNTER — Ambulatory Visit
Admission: RE | Admit: 2019-02-05 | Discharge: 2019-02-05 | Disposition: A | Discharge status: Home / Self Care | Payer: BC Managed Care – PPO | Source: Ambulatory Visit | Attending: Radiation Oncology | Admitting: Radiation Oncology

## 2019-02-05 ENCOUNTER — Other Ambulatory Visit: Payer: Self-pay

## 2019-02-05 DIAGNOSIS — Z510 Encounter for antineoplastic radiation therapy: Secondary | ICD-10-CM | POA: Diagnosis not present

## 2019-02-05 DIAGNOSIS — C50411 Malignant neoplasm of upper-outer quadrant of right female breast: Secondary | ICD-10-CM | POA: Diagnosis not present

## 2019-02-05 DIAGNOSIS — Z9221 Personal history of antineoplastic chemotherapy: Secondary | ICD-10-CM | POA: Diagnosis not present

## 2019-02-05 DIAGNOSIS — Z9013 Acquired absence of bilateral breasts and nipples: Secondary | ICD-10-CM | POA: Diagnosis not present

## 2019-02-05 DIAGNOSIS — Z171 Estrogen receptor negative status [ER-]: Secondary | ICD-10-CM | POA: Diagnosis not present

## 2019-02-06 ENCOUNTER — Encounter: Payer: BC Managed Care – PPO | Admitting: Physical Therapy

## 2019-02-06 ENCOUNTER — Ambulatory Visit
Admission: RE | Admit: 2019-02-06 | Discharge: 2019-02-06 | Disposition: A | Discharge status: Home / Self Care | Payer: BC Managed Care – PPO | Source: Ambulatory Visit | Attending: Radiation Oncology | Admitting: Radiation Oncology

## 2019-02-06 ENCOUNTER — Other Ambulatory Visit: Payer: Self-pay

## 2019-02-06 ENCOUNTER — Ambulatory Visit: Payer: BC Managed Care – PPO

## 2019-02-06 ENCOUNTER — Other Ambulatory Visit: Payer: BC Managed Care – PPO

## 2019-02-06 DIAGNOSIS — Z171 Estrogen receptor negative status [ER-]: Secondary | ICD-10-CM | POA: Diagnosis not present

## 2019-02-06 DIAGNOSIS — Z510 Encounter for antineoplastic radiation therapy: Secondary | ICD-10-CM | POA: Diagnosis not present

## 2019-02-06 DIAGNOSIS — Z9221 Personal history of antineoplastic chemotherapy: Secondary | ICD-10-CM | POA: Diagnosis not present

## 2019-02-06 DIAGNOSIS — Z9013 Acquired absence of bilateral breasts and nipples: Secondary | ICD-10-CM | POA: Diagnosis not present

## 2019-02-06 DIAGNOSIS — C50411 Malignant neoplasm of upper-outer quadrant of right female breast: Secondary | ICD-10-CM | POA: Diagnosis not present

## 2019-02-07 ENCOUNTER — Ambulatory Visit: Payer: BC Managed Care – PPO

## 2019-02-07 ENCOUNTER — Other Ambulatory Visit: Payer: Self-pay

## 2019-02-07 ENCOUNTER — Ambulatory Visit
Admission: RE | Admit: 2019-02-07 | Discharge: 2019-02-07 | Disposition: A | Discharge status: Home / Self Care | Payer: BC Managed Care – PPO | Source: Ambulatory Visit | Attending: Radiation Oncology | Admitting: Radiation Oncology

## 2019-02-07 DIAGNOSIS — Z9221 Personal history of antineoplastic chemotherapy: Secondary | ICD-10-CM | POA: Diagnosis not present

## 2019-02-07 DIAGNOSIS — Z171 Estrogen receptor negative status [ER-]: Secondary | ICD-10-CM | POA: Diagnosis not present

## 2019-02-07 DIAGNOSIS — C50411 Malignant neoplasm of upper-outer quadrant of right female breast: Secondary | ICD-10-CM | POA: Diagnosis not present

## 2019-02-07 DIAGNOSIS — Z9013 Acquired absence of bilateral breasts and nipples: Secondary | ICD-10-CM | POA: Diagnosis not present

## 2019-02-07 DIAGNOSIS — Z510 Encounter for antineoplastic radiation therapy: Secondary | ICD-10-CM | POA: Diagnosis not present

## 2019-02-09 NOTE — Progress Notes (Signed)
Patient Care Team: Nicholas Lose, MD as PCP - General (Hematology and Oncology) Rolm Bookbinder, MD as Consulting Physician (General Surgery) Magrinat, Virgie Dad, MD as Consulting Physician (Oncology) Gery Pray, MD as Consulting Physician (Radiation Oncology) Emily Filbert, MD as Consulting Physician (Obstetrics and Gynecology) Lavonna Monarch, MD as Consulting Physician (Dermatology) Mauro Kaufmann, RN as Oncology Nurse Navigator Rockwell Germany, RN as Oncology Nurse Navigator  DIAGNOSIS:    ICD-10-CM   1. Malignant neoplasm of upper-outer quadrant of right breast in female, estrogen receptor negative ([place])  C50.411    Z17.1     SUMMARY OF ONCOLOGIC HISTORY: Oncology History  Malignant neoplasm of upper-outer quadrant of right breast in female, estrogen receptor negative ([place])  05/03/2018 Initial Diagnosis   right breast upper outer quadrant and right axillary lymph node biopsy 05/03/2018 for a clinical T2 N2, stage IIIC invasive ductal carcinoma, triple negative, with an Ki-67 of 75%   06/03/2018 - 10/18/2018 Neo-Adjuvant Chemotherapy   Dose dense Adriamycin and Cytoxan followed by Taxol and carboplatin x12   11/14/2018 Surgery   Bilateral mastectomies: Left mastectomy: Fibrocystic changes, right mastectomy: Complete pathologic response no evidence of residual cancer, 0/13 lymph nodes negative   12/31/2018 -  Radiation Therapy   Adjuvant XRT     CHIEF COMPLIANT: Follow-up after radiation to discuss further treatment.   INTERVAL HISTORY: Darlene Grant is a 43 y.o. with above-mentioned history of triple negative right breast cancer treated with neoadjuvant chemotherapy, bilateral mastectomies, and who is currently undergoing radiation treatment. She presents to the clinic today for follow-up.   REVIEW OF SYSTEMS:   Constitutional: Denies fevers, chills or abnormal weight loss Eyes: Denies blurriness of vision Ears, nose, mouth, throat, and face: Denies  mucositis or sore throat Respiratory: Denies cough, dyspnea or wheezes Cardiovascular: Denies palpitation, chest discomfort Gastrointestinal: Denies nausea, heartburn or change in bowel habits Skin: Denies abnormal skin rashes Lymphatics: Denies new lymphadenopathy or easy bruising Neurological: Denies numbness, tingling or new weaknesses Behavioral/Psych: Mood is stable, no new changes  Extremities: No lower extremity edema Breast: s/p bilateral mastectomies All other systems were reviewed with the patient and are negative.  I have reviewed the past medical history, past surgical history, social history and family history with the patient and they are unchanged from previous note.  ALLERGIES:  is allergic to augmentin [amoxicillin-pot clavulanate]; hydrocodone; and keflex [cephalexin].  MEDICATIONS:  Current Outpatient Medications  Medication Sig Dispense Refill  . B Complex-C (B-COMPLEX WITH VITAMIN C) tablet Take 1 tablet by mouth daily.    . Calcium Acetate-Magnesium Carb 450-200 MG TABS Take by mouth.    . cyclobenzaprine (FLEXERIL) 10 MG tablet Take 0.5-1 tablets (5-10 mg total) by mouth 3 (three) times daily as needed for muscle spasms. (Patient not taking: Reported on 01/27/2019) 30 tablet 1  . fluticasone (FLONASE) 50 MCG/ACT nasal spray Place 2 sprays into both nostrils daily. (Patient not taking: Reported on 01/27/2019) 16 g 2  . loratadine (CLARITIN) 10 MG tablet Take 10 mg by mouth daily as needed for allergies.    . Multiple Vitamins-Minerals (MULTIVITAMIN WITH MINERALS) tablet Take 1 tablet by mouth daily.    . Probiotic Product (PROBIOTIC DAILY PO) Take 1 tablet by mouth.     No current facility-administered medications for this visit.     PHYSICAL EXAMINATION: ECOG PERFORMANCE STATUS: 1 - Symptomatic but completely ambulatory  Vitals:   02/10/19 0946  BP: 111/73  Pulse: 74  Resp: 18  Temp: 98.2 F (36.8  C)  SpO2: 100%   Filed Weights   02/10/19 0946  Weight:  144 lb 6.4 oz (65.5 kg)    GENERAL: alert, no distress and comfortable SKIN: skin color, texture, turgor are normal, no rashes or significant lesions EYES: normal, Conjunctiva are pink and non-injected, sclera clear OROPHARYNX: no exudate, no erythema and lips, buccal mucosa, and tongue normal  NECK: supple, thyroid normal size, non-tender, without nodularity LYMPH: no palpable lymphadenopathy in the cervical, axillary or inguinal LUNGS: clear to auscultation and percussion with normal breathing effort HEART: regular rate & rhythm and no murmurs and no lower extremity edema ABDOMEN: abdomen soft, non-tender and normal bowel sounds MUSCULOSKELETAL: no cyanosis of digits and no clubbing  NEURO: alert & oriented x 3 with fluent speech, no focal motor/sensory deficits EXTREMITIES: No lower extremity edema  LABORATORY DATA:  I have reviewed the data as listed CMP Latest Ref Rng & Units 01/22/2019 12/02/2018 10/29/2018  Glucose 70 - 99 mg/dL 93 89 94  BUN 6 - 20 mg/dL _0 Creatinine 0.44 - 1.00 mg/dL 0.78 0.75 0.84  Sodium 135 - 145 mmol/L 139 141 140  Potassium 3.5 - 5.1 mmol/L 4.1 3.9 4.4  Chloride 98 - 111 mmol/L 103 104 105  CO2 22 - 32 mmol/L _1 Calcium 8.9 - 10.3 mg/dL 10.0 10.0 9.6  Total Protein 6.5 - 8.1 g/dL 7.4 7.3 7.1  Total Bilirubin 0.3 - 1.2 mg/dL 1.6(H) 1.0 0.9  Alkaline Phos 38 - 126 U/L 70 72 68  AST 15 - 41 U/L 15 14(L) 14(L)  ALT 0 - 44 U/L _2 Lab Results  Component Value Date   WBC 3.1 (L) 01/22/2019   HGB 12.1 01/22/2019   HCT 36.3 01/22/2019   MCV 100.8 (H) 01/22/2019   PLT 193 01/22/2019   NEUTROABS 2.2 01/22/2019    ASSESSMENT & PLAN:  Malignant neoplasm of upper-outer quadrant of right breast in female, estrogen receptor negative ([place]) 05/03/2018: right breast upper outer quadrant and right axillary lymph node biopsy 05/03/2018 for a clinical T2 N2, stage IIIC invasive ductal carcinoma,triple negative, with an Ki-67 75%, no  deleterious genetic mutations  06/03/2018-10/18/2018: Neoadjuvant chemotherapy with dose dense Adriamycin and Cytoxan followed by Taxol and carboplatin  11/14/2018: Bilateral mastectomies: Left mastectomy: Fibrocystic changes, right mastectomy: Complete pathologic response no evidence of residual cancer, 0/13 lymph nodes negative  Completed adjuvant radiation therapy  Treatment plan: Surveillance Patient was counseled about ABC clinical trial.  She will think about it next year.  She is an occupational therapist.  We discussed the importance of getting back to her exercises and getting stronger.  She still continues to have a lot of discomfort from the breast reconstruction along with breast swelling.  She is very anxious about not coming regularly for visits.  We will see her in December for follow-up to discuss the results of the scans.     No orders of the defined types were placed in this encounter.  The patient has a good understanding of the overall plan. she agrees with it. she will call with any problems that may develop before the next visit here.  Nicholas Lose, MD 02/10/2019  Julious Oka Dorshimer am acting as scribe for Dr. Nicholas Lose.  I have reviewed the above documentation for accuracy and completeness, and I agree with the above.

## 2019-02-10 ENCOUNTER — Ambulatory Visit: Payer: BC Managed Care – PPO

## 2019-02-10 ENCOUNTER — Inpatient Hospital Stay (HOSPITAL_BASED_OUTPATIENT_CLINIC_OR_DEPARTMENT_OTHER): Payer: BC Managed Care – PPO | Admitting: Hematology and Oncology

## 2019-02-10 ENCOUNTER — Ambulatory Visit: Payer: BC Managed Care – PPO | Admitting: Hematology and Oncology

## 2019-02-10 ENCOUNTER — Encounter: Payer: Self-pay | Admitting: Radiation Oncology

## 2019-02-10 ENCOUNTER — Ambulatory Visit
Admission: RE | Admit: 2019-02-10 | Discharge: 2019-02-10 | Disposition: A | Discharge status: Home / Self Care | Payer: BC Managed Care – PPO | Source: Ambulatory Visit | Attending: Radiation Oncology | Admitting: Radiation Oncology

## 2019-02-10 ENCOUNTER — Encounter (HOSPITAL_BASED_OUTPATIENT_CLINIC_OR_DEPARTMENT_OTHER): Payer: Self-pay | Admitting: General Surgery

## 2019-02-10 ENCOUNTER — Telehealth: Payer: Self-pay | Admitting: *Deleted

## 2019-02-10 ENCOUNTER — Other Ambulatory Visit: Payer: Self-pay

## 2019-02-10 ENCOUNTER — Encounter: Payer: Self-pay | Admitting: *Deleted

## 2019-02-10 DIAGNOSIS — Z510 Encounter for antineoplastic radiation therapy: Secondary | ICD-10-CM | POA: Diagnosis not present

## 2019-02-10 DIAGNOSIS — Z171 Estrogen receptor negative status [ER-]: Secondary | ICD-10-CM | POA: Diagnosis not present

## 2019-02-10 DIAGNOSIS — C50411 Malignant neoplasm of upper-outer quadrant of right female breast: Secondary | ICD-10-CM | POA: Diagnosis not present

## 2019-02-10 DIAGNOSIS — Z9013 Acquired absence of bilateral breasts and nipples: Secondary | ICD-10-CM | POA: Diagnosis not present

## 2019-02-10 DIAGNOSIS — Z9221 Personal history of antineoplastic chemotherapy: Secondary | ICD-10-CM | POA: Diagnosis not present

## 2019-02-10 NOTE — Telephone Encounter (Signed)
Called pt to congratulate on completion of xrt and to assess needs. Contact information provided for questions or needs.

## 2019-02-10 NOTE — Assessment & Plan Note (Signed)
05/03/2018: right breast upper outer quadrant and right axillary lymph node biopsy 05/03/2018 for a clinical T2 N2, stage IIIC invasive ductal carcinoma,triple negative, with an Ki-67 75%, no deleterious genetic mutations  06/03/2018-10/18/2018: Neoadjuvant chemotherapy with dose dense Adriamycin and Cytoxan followed by Taxol and carboplatin  11/14/2018: Bilateral mastectomies: Left mastectomy: Fibrocystic changes, right mastectomy: Complete pathologic response no evidence of residual cancer, 0/13 lymph nodes negative  Completed adjuvant radiation therapy  Treatment plan: Surveillance Patient was counseled about ABC clinical trial.  She will think about it next year.  She is an occupational therapist.  We discussed the importance of getting back to her exercises and getting stronger.  She still continues to have a lot of discomfort from the breast reconstruction along with breast swelling.  She is very anxious about not coming regularly for visits.  We will see her in December for follow-up to discuss the results of the scans.

## 2019-02-11 ENCOUNTER — Ambulatory Visit: Payer: BC Managed Care – PPO

## 2019-02-11 ENCOUNTER — Encounter: Payer: BC Managed Care – PPO | Admitting: Physical Therapy

## 2019-02-12 ENCOUNTER — Ambulatory Visit: Payer: BC Managed Care – PPO

## 2019-02-13 ENCOUNTER — Other Ambulatory Visit: Payer: Self-pay

## 2019-02-13 ENCOUNTER — Encounter: Payer: BC Managed Care – PPO | Admitting: Physical Therapy

## 2019-02-13 ENCOUNTER — Encounter: Payer: Self-pay | Admitting: Physical Therapy

## 2019-02-13 ENCOUNTER — Ambulatory Visit: Payer: BC Managed Care – PPO | Admitting: Physical Therapy

## 2019-02-13 DIAGNOSIS — M25612 Stiffness of left shoulder, not elsewhere classified: Secondary | ICD-10-CM

## 2019-02-13 DIAGNOSIS — M6281 Muscle weakness (generalized): Secondary | ICD-10-CM | POA: Diagnosis not present

## 2019-02-13 DIAGNOSIS — M25552 Pain in left hip: Secondary | ICD-10-CM

## 2019-02-13 DIAGNOSIS — Z483 Aftercare following surgery for neoplasm: Secondary | ICD-10-CM

## 2019-02-13 DIAGNOSIS — R293 Abnormal posture: Secondary | ICD-10-CM

## 2019-02-13 DIAGNOSIS — M25511 Pain in right shoulder: Secondary | ICD-10-CM

## 2019-02-13 DIAGNOSIS — M25611 Stiffness of right shoulder, not elsewhere classified: Secondary | ICD-10-CM

## 2019-02-13 NOTE — Patient Instructions (Signed)
Access Code: [email or web address]  URL: [email or web address]  Date: 02/13/2019  Prepared by: Earlie Counts   Exercises Seated Quadratus Lumborum Stretch with Arm Overhead - 2 reps                   - 1 sets - 30 sec hold - 1x daily - 7x weekly Seated Piriformis Stretch with Trunk Bend - 2 reps - 1 sets - 30 sec hold - 1x daily - 7x weekly Seated Hamstring Stretch - 2 reps - 1 sets - 30 sec hold - 1x daily - 7x weekly Patient Education Trigger Point [facility] [address] [place], [place] [address] Phone # [phone] Fax [phone]

## 2019-02-13 NOTE — Therapy (Signed)
[facility] [address], [place], [place], [address] Phone: [phone]   Fax:  [phone]  Physical Therapy Treatment  Patient Details  Name: Darlene Grant MRN: [id number] Date of Birth: [birth year] Referring Provider (PT): Dr. Nicholas Lose; Dr. Clovia Cuff   Encounter Date: 02/13/2019  PT End of Session - 02/13/19 1012    Visit Number  14    Date for PT Re-Evaluation  04/28/19    PT Start Time  0845    PT Stop Time  0925    PT Time Calculation (min)  40 min    Activity Tolerance  Patient tolerated treatment well;No increased pain    Behavior During Therapy  WFL for tasks assessed/performed       Past Medical History:  Diagnosis Date  . Abnormal Pap smear    ASCUS 03/13/11  . Anemia   . Breast pain, left   . Cancer ([place])   . Candida vaginitis   . Complication of anesthesia    pt states that all narcotics make her angry and she would prefer to avoid them.  . Rectal itching   . Vaginal Pap smear, abnormal     Past Surgical History:  Procedure Laterality Date  . BREAST BIOPSY Right 8/16  . CESAREAN SECTION N/A 11/21/2013   Procedure: CESAREAN SECTION;  Surgeon: Alwyn Pea, MD;  Location: [facility];  Service: Obstetrics;  Laterality: N/A;  . CESAREAN SECTION N/A 06/23/2016   Procedure: CESAREAN SECTION;  Surgeon: Emily Filbert, MD;  Location: [place];  Service: Obstetrics;  Laterality: N/A;  . HAND SURGERY  04/2012   ligament repair  . MASTECTOMY WITH AXILLARY LYMPH NODE DISSECTION Bilateral 11/14/2018   Procedure: RIGHT MASTECTOMY WITH RIGHT AXILLARY LYMPH NODE DISSECTION AND LEFT RISK REDUCING MASTECTOMY;  Surgeon: Rolm Bookbinder, MD;  Location: [place];  Service: General;  Laterality: Bilateral;  LATE ENTRY: Corrected laterality documentation to read right axillary lymph node dissection vs. documented laterality of left.    Marland Kitchen PILONIDAL CYST EXCISION  2000  .  PORTACATH PLACEMENT N/A 05/28/2018   Procedure: INSERTION PORT-A-CATH WITH ULTRASOUND;  Surgeon: Rolm Bookbinder, MD;  Location: [place] [facility];  Service: General;  Laterality: N/A;  . WISDOM TOOTH EXTRACTION  AGE 42    There were no vitals filed for this visit.  Subjective Assessment - 02/13/19 0850    Subjective  I am too sore in the right shoulder so not wanting to work on the shoulder. I woke up with a kink in my upper trap into the right scapular.    Pertinent History  breast cancer diagnosed November 2019: triple negative with Ki of 75% She completed chemotherapy 10/18/2018. 11/14/2018: she underwent bilateral mastectomy with 0/13 nodes positive on the right    Patient Stated Goals  sleep without pain, exercise without discomfort, carry daughter on left due to radiation on the right breast    Currently in Pain?  Yes    Pain Score  1     Pain Location  Hip    Pain Orientation  Left    Pain Descriptors / Indicators  Tender    Pain Type  Chronic pain    Pain Onset  More than a month ago    Pain Frequency  Constant    Aggravating Factors   step on left foot and extend the left hip, laying on left side, carrying daughter on left hip    Pain Relieving Factors  lay on right side    Multiple Pain Sites  Yes    Pain Score  6    Pain Location  Shoulder   upper trap   Pain Orientation  Right    Pain Descriptors / Indicators  Aching;Sore;Tightness    Pain Type  Acute pain    Pain Onset  In the past 7 days    Pain Frequency  Constant    Aggravating Factors   turning head, looking upward, driving, lifting child    Pain Relieving Factors  keep head still                       [facility] Adult PT Treatment/Exercise - 02/13/19 0001      Lumbar Exercises: Stretches   Active Hamstring Stretch  Left;Right;1 rep;30 seconds   sitting   Piriformis Stretch  Right;Left;1 rep;30 seconds   sitting   Other Lumbar Stretch Exercise  quadratus stretch in sitting and trunk  rotation in sitting      Manual Therapy   Manual Therapy  Soft tissue mobilization;Joint mobilization    Joint Mobilization  Sideglide and rotational upslide glide on the right C2-C6; P-A and rotational mobilization to L1-L5    Soft tissue mobilization  cervical paraspinals, and right upper trap and suboccipitals; lumbar paraspinals and quadratus       Trigger Point Dry Needling - 02/13/19 0001    Consent Given?  Yes    Education Handout Provided  Yes    Muscles Treated Back/Hip  Lumbar multifidi;Quadratus lumborum   on right   Lumbar multifidi Response  Twitch response elicited;Palpable increased muscle length    Quadratus Lumborum Response  Twitch response elicited;Palpable increased muscle length           PT Education - 02/13/19 0930    Education Details  Access Code: [email or web address]    Person(s) Educated  Patient    Methods  Explanation;Demonstration;Verbal cues;Handout    Comprehension  Returned demonstration;Verbalized understanding       PT Short Term Goals - 02/03/19 1305      PT SHORT TERM GOAL #1   Title  independent with initial HEP for left hip    Time  4    Period  Weeks    Status  New    Target Date  03/03/19      PT SHORT TERM GOAL #2   Title  understand how to take care of vaginal health to reduce dryness and keep tissue mobility    Time  4    Period  Weeks    Status  New    Target Date  03/03/19        PT Long Term Goals - 02/03/19 1305      PT LONG TERM GOAL #1   Title  Pt will have 150 degrees of painfree right shoulder abduction so that she can perform her household chores easier    Baseline  108 with pain; 164 degrees without pain-01/15/19    Time  4    Period  Weeks    Status  Achieved      PT LONG TERM GOAL #2   Title  Pt will report 50% decrease in right shoulder tightness and pulling from axillary cording    Baseline  75% reported at this time-01/15/19    Time  4    Period  Weeks    Status  Achieved      PT LONG TERM GOAL #3   Title  Pt will be independent in a home exercise program for shoulder ROM and strength    Time  4    Period  Weeks    Status  On-going      PT LONG TERM GOAL #4   Title  Pt to report less tightness with OH reaching by at least 50% at Lt axilla.    Status  New      PT LONG TERM GOAL #5   Title  Patient able to ambulate without left hip pain due to pelvis in correct alignment    Time  12    Period  Weeks    Status  New    Target Date  04/28/19      Additional Long Term Goals   Additional Long Term Goals  Yes      PT LONG TERM GOAL #6   Title  able to lay on left hip for sleeping with minimal to no pain    Time  12    Period  Weeks    Status  New    Target Date  04/28/19      PT LONG TERM GOAL #7   Title  able to hold her child on the left hip due to the radiation soreness on the right breast with pain minimal to none    Time  12    Period  Weeks    Status  New    Target Date  04/28/19            Plan - 02/13/19 0849    Clinical Impression Statement  Patient has improvement of the shoulder and lumbar pain after manual work. Patient just had radiation so she was not up to right shoulder ROM. Patient was able to stretch better after therapy. Patient has learned exercises to stretch while being with her kids. Patient has not met goals due to just starting therapy for her back and hip. Patient will benefit from skilled therapy to improve righ tshoulder mobility, reduce right hip pain and education on vaginal health.    Personal Factors and Comorbidities  Comorbidity 1;Sex    Comorbidities  recent chemo    Examination-Activity Limitations  Bathing;Reach Overhead;Caring for Others;Lift;Hygiene/Grooming;Locomotion Level;Sleep    Stability/Clinical Decision Making  Evolving/Moderate complexity    Rehab Potential  Good    PT Frequency  1x / week    PT Duration  12 weeks    PT Treatment/Interventions  ADLs/Self Care Home Management;Therapeutic exercise;Manual lymph drainage;Manual  techniques;Biofeedback;Electrical Stimulation;Therapeutic activities;Neuromuscular re-education;Patient/family education;Dry needling;Passive range of motion;Spinal Manipulations    PT Next Visit Plan  Cont focusing on end ROM for bil shoulders and closely monitor skin at Rt chest wall. Will need to place pt on hold if redness worsens and pt feels skin pulling, or just stop treatment for Rt continuing with Lt prn; dry needling to left iliopsoas and left lumbar; see if pelvis in correct alignment; spinal mobs    PT Home Exercise Plan  Doorway stretches, supine scap series; supine over towel roll or foam as tolerated along T-spine for bil abduction;Access Code: [email or web address]    Recommended Other Services  MD signed initial eval    Consulted and Agree with Plan of Care  Patient       Patient will benefit from skilled therapeutic intervention in order to improve the following deficits and impairments:  Decreased knowledge of precautions, Decreased knowledge of use of DME, Postural dysfunction, Decreased range of motion, Increased fascial restricitons, Impaired UE functional use, Increased  muscle spasms, Impaired perceived functional ability, Pain, Decreased scar mobility, Decreased strength, Increased edema, Difficulty walking, Decreased activity tolerance, Decreased mobility  Visit Diagnosis: Pain in left hip  Stiffness of right shoulder joint  Acute pain of right shoulder  Aftercare following surgery for neoplasm  Abnormal posture  Muscle weakness (generalized)  Stiffness of left shoulder, not elsewhere classified     Problem List Patient Active Problem List   Diagnosis Date Noted  . History of right breast cancer 11/14/2018  . Chemotherapy induced neutropenia ([place]) 09/13/2018  . Port-A-Cath in place 06/10/2018  . Malignant neoplasm of upper-outer quadrant of right breast in female, estrogen receptor negative ([place]) 05/09/2018  . Low iron stores 01/16/2018  . Muscle spasm 01/15/2018  .  Piriformis syndrome of left side 01/15/2018  . Grade I hemorrhoids 02/23/2017  . Low lying posterior placenta, antepartum 02/01/2016  . AMA (advanced maternal age) multigravida 35+ 11/22/2015  . 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 Calcification of right breast 02/19/2015  . Ligament tear 07/05/2012    Earlie Counts, PT 02/13/19 10:13 AM   [facility] [address], [place], [place], [address] Phone: [phone]   Fax:  [phone]  Name: Darlene Grant MRN: [id number] Date of Birth: 05/11/1976

## 2019-02-19 DIAGNOSIS — H3500 Unspecified background retinopathy: Secondary | ICD-10-CM | POA: Diagnosis not present

## 2019-02-20 ENCOUNTER — Encounter: Payer: Self-pay | Admitting: Hematology and Oncology

## 2019-02-20 ENCOUNTER — Ambulatory Visit: Payer: BC Managed Care – PPO

## 2019-02-20 ENCOUNTER — Encounter: Payer: Self-pay | Admitting: Physical Therapy

## 2019-02-20 ENCOUNTER — Other Ambulatory Visit: Payer: Self-pay

## 2019-02-20 ENCOUNTER — Ambulatory Visit: Payer: BC Managed Care – PPO | Attending: Hematology and Oncology | Admitting: Physical Therapy

## 2019-02-20 DIAGNOSIS — R293 Abnormal posture: Secondary | ICD-10-CM | POA: Diagnosis not present

## 2019-02-20 DIAGNOSIS — M25612 Stiffness of left shoulder, not elsewhere classified: Secondary | ICD-10-CM | POA: Diagnosis not present

## 2019-02-20 DIAGNOSIS — M25511 Pain in right shoulder: Secondary | ICD-10-CM | POA: Insufficient documentation

## 2019-02-20 DIAGNOSIS — M25611 Stiffness of right shoulder, not elsewhere classified: Secondary | ICD-10-CM | POA: Diagnosis not present

## 2019-02-20 DIAGNOSIS — M6281 Muscle weakness (generalized): Secondary | ICD-10-CM | POA: Insufficient documentation

## 2019-02-20 DIAGNOSIS — M25552 Pain in left hip: Secondary | ICD-10-CM | POA: Insufficient documentation

## 2019-02-20 DIAGNOSIS — Z483 Aftercare following surgery for neoplasm: Secondary | ICD-10-CM | POA: Diagnosis not present

## 2019-02-20 NOTE — Therapy (Signed)
[facility] [address] [place], [place], [address] Phone: [phone]   Fax:  [phone]  Physical Therapy Treatment  Patient Details  Name: Darlene Grant MRN: [id number] Date of Birth: 1975/10/06 Referring Provider (PT): Dr. Nicholas Lose; Dr. Clovia Cuff   Encounter Date: 02/20/2019  PT End of Session - 02/20/19 0908    Visit Number  15    Date for PT Re-Evaluation  04/28/19    Authorization Type  [employer]    PT Start Time  0845    PT Stop Time  1010    PT Time Calculation (min)  85 min    Activity Tolerance  Patient tolerated treatment well;No increased pain    Behavior During Therapy  WFL for tasks assessed/performed       Past Medical History:  Diagnosis Date  . Abnormal Pap smear    ASCUS 03/13/11  . Anemia   . Breast pain, left   . Cancer ([place])   . Candida vaginitis   . Complication of anesthesia    pt states that all narcotics make her angry and she would prefer to avoid them.  . Rectal itching   . Vaginal Pap smear, abnormal     Past Surgical History:  Procedure Laterality Date  . BREAST BIOPSY Right 8/16  . CESAREAN SECTION N/A 11/21/2013   Procedure: CESAREAN SECTION;  Surgeon: Alwyn Pea, MD;  Location: [facility];  Service: Obstetrics;  Laterality: N/A;  . CESAREAN SECTION N/A 06/23/2016   Procedure: CESAREAN SECTION;  Surgeon: Emily Filbert, MD;  Location: [facility];  Service: Obstetrics;  Laterality: N/A;  . HAND SURGERY  04/2012   ligament repair  . MASTECTOMY WITH AXILLARY LYMPH NODE DISSECTION Bilateral 11/14/2018   Procedure: RIGHT MASTECTOMY WITH RIGHT AXILLARY LYMPH NODE DISSECTION AND LEFT RISK REDUCING MASTECTOMY;  Surgeon: Rolm Bookbinder, MD;  Location: [facility];  Service: General;  Laterality: Bilateral;  LATE ENTRY: Corrected laterality documentation to read right axillary lymph node dissection vs. documented laterality of left.    Marland Kitchen PILONIDAL  CYST EXCISION  2000  . PORTACATH PLACEMENT N/A 05/28/2018   Procedure: INSERTION PORT-A-CATH WITH ULTRASOUND;  Surgeon: Rolm Bookbinder, MD;  Location: [facility];  Service: General;  Laterality: N/A;  . WISDOM TOOTH EXTRACTION  AGE 60    There were no vitals filed for this visit.  Subjective Assessment - 02/20/19 0849    Subjective  I feel next week I will be ready for someone to work my right shoulder. I am struggling more with left shoulder tightness. My neck feels good. the rim of the left hip is sore and lower back is sore. I felt better after the dry needling.    Pertinent History  breast cancer diagnosed November 2019: triple negative with Ki of 75% She completed chemotherapy 10/18/2018. 11/14/2018: she underwent bilateral mastectomy with 0/13 nodes positive on the right    Patient Stated Goals  sleep without pain, exercise without discomfort, carry daughter on left due to radiation on the right breast    Currently in Pain?  Yes    Pain Score  3     Pain Location  Hip    Pain Orientation  Left    Pain Descriptors / Indicators  Sore    Pain Type  Chronic pain    Pain Onset  More than a month ago    Pain Frequency  Constant    Aggravating Factors   step  on left foot and extend the left hip, laying on left side, carrying daughter on left hip    Pain Relieving Factors  lay on right side    Multiple Pain Sites  Yes    Pain Score  3    Pain Location  Shoulder    Pain Orientation  Right    Pain Descriptors / Indicators  Aching;Sore;Tightness    Pain Type  Acute pain    Pain Onset  In the past 7 days    Pain Frequency  Intermittent    Aggravating Factors   lift left arm out to side at 90 degrees; lifting daughter    Pain Relieving Factors  arm at side         [facility] PT Assessment - 02/20/19 0001      Palpation   SI assessment   pelvis in correct alignment                   [facility] Adult PT Treatment/Exercise - 02/20/19 0001      Self-Care    Self-Care  Other Self-Care Comments    Other Self-Care Comments   education on vaginal lubricants and vaginal moisturizers to help wiht vaginal dryness      Lumbar Exercises: Supine   Ab Set  10 reps;5 seconds    AB Set Limitations  with pelvic floor contraction    Clam  20 reps;1 second   10x each side   Bridge  15 reps    Bridge Limitations  keping hips leveled       Knee/Hip Exercises: Aerobic   Nustep  level 3 just legs for 5 min while assessing patient      Shoulder Exercises: Pulleys   Flexion  1 minute    Flexion Limitations  therapist distraction the left shoulder    ABduction  2 minutes    ABduction Limitations  therapist distraction of the left shoulder      Shoulder Exercises: Therapy Ball   Flexion  Both;10 reps   sitting on the mat with ball on floor   Flexion Limitations  roll ball side to side with hips flexed      Shoulder Exercises: ROM/Strengthening   Other ROM/Strengthening Exercises  UE ranger for left shoulder and right shoulder with therapist guiding the scapula and breath; flexion for right and left ; right and left large circles       Manual Therapy   Manual Therapy  Soft tissue mobilization    Soft tissue mobilization  cervical paraspinals, upper trap, interscapular, left deltoid, left pectoralis, left RTC             PT Education - 02/20/19 1009    Education Details  Access Code: [email or web address] ; information on vaginal lubricants and oisturizers    Person(s) Educated  Patient    Methods  Explanation;Demonstration;Verbal cues;Handout    Comprehension  Returned demonstration;Verbalized understanding       PT Short Term Goals - 02/20/19 0935      PT SHORT TERM GOAL #1   Title  independent with initial HEP for left hip    Time  4    Period  Weeks    Status  Achieved    Target Date  03/03/19      PT SHORT TERM GOAL #2   Title  understand how to take care of vaginal health to reduce dryness and keep tissue mobility    Time  4    Period  Weeks  Status  Achieved    Target Date  03/03/19        PT Long Term Goals - 02/03/19 1305      PT LONG TERM GOAL #1   Title  Pt will have 150 degrees of painfree right shoulder abduction so that she can perform her household chores easier    Baseline  108 with pain; 164 degrees without pain-01/15/19    Time  4    Period  Weeks    Status  Achieved      PT LONG TERM GOAL #2   Title  Pt will report 50% decrease in right shoulder tightness and pulling from axillary cording    Baseline  75% reported at this time-01/15/19    Time  4    Period  Weeks    Status  Achieved      PT LONG TERM GOAL #3   Title  Pt will be independent in a home exercise program for shoulder ROM and strength    Time  4    Period  Weeks    Status  On-going      PT LONG TERM GOAL #4   Title  Pt to report less tightness with OH reaching by at least 50% at Lt axilla.    Status  New      PT LONG TERM GOAL #5   Title  Patient able to ambulate without left hip pain due to pelvis in correct alignment    Time  12    Period  Weeks    Status  New    Target Date  04/28/19      Additional Long Term Goals   Additional Long Term Goals  Yes      PT LONG TERM GOAL #6   Title  able to lay on left hip for sleeping with minimal to no pain    Time  12    Period  Weeks    Status  New    Target Date  04/28/19      PT LONG TERM GOAL #7   Title  able to hold her child on the left hip due to the radiation soreness on the right breast with pain minimal to none    Time  12    Period  Weeks    Status  New    Target Date  04/28/19            Plan - 02/20/19 0909    Clinical Impression Statement  Patient reports walking and stairs is 50% better. Patient has tightness in left shoulder with abduction. Patient has trigger points in the interscapular area and right cervical paraspinals. Patient needed assistance with t scapular movements with shoulder ROM exercises. Patient needs tactile cues to relax the right upper trap  with right shoulder movements. Patient pelvis in correct alignment. Patient is learning core exercises. Patient will benefit from skilled therapy to improve right shoulder mobility, reduce right hip pain and education on vaginal health.    Personal Factors and Comorbidities  Comorbidity 1;Sex    Comorbidities  recent chemo    Examination-Activity Limitations  Bathing;Reach Overhead;Caring for Others;Lift;Hygiene/Grooming;Locomotion Level;Sleep    Stability/Clinical Decision Making  Evolving/Moderate complexity    Rehab Potential  Good    PT Frequency  1x / week    PT Duration  12 weeks    PT Treatment/Interventions  ADLs/Self Care Home Management;Therapeutic exercise;Manual lymph drainage;Manual techniques;Biofeedback;Electrical Stimulation;Therapeutic activities;Neuromuscular re-education;Patient/family education;Dry needling;Passive range of motion;Spinal Manipulations  PT Next Visit Plan  Cont focusing on end ROM for bil shoulders and closely monitor skin at Rt chest wall. Will need to place pt on hold if redness worsens and pt feels skin pulling, or just stop treatment for Rt continuing with Lt prn spinal mobs; core strength    PT Home Exercise Plan  ;Access Code: [email or web address]    Consulted and Agree with Plan of Care  Patient       Patient will benefit from skilled therapeutic intervention in order to improve the following deficits and impairments:  Decreased knowledge of precautions, Decreased knowledge of use of DME, Postural dysfunction, Decreased range of motion, Increased fascial restricitons, Impaired UE functional use, Increased muscle spasms, Impaired perceived functional ability, Pain, Decreased scar mobility, Decreased strength, Increased edema, Difficulty walking, Decreased activity tolerance, Decreased mobility  Visit Diagnosis: Pain in left hip  Stiffness of right shoulder joint  Acute pain of right shoulder  Aftercare following surgery for neoplasm  Abnormal  posture  Muscle weakness (generalized)  Stiffness of left shoulder, not elsewhere classified     Problem List Patient Active Problem List   Diagnosis Date Noted  . History of right breast cancer 11/14/2018  . Chemotherapy induced neutropenia ([place]) 09/13/2018  . Port-A-Cath in place 06/10/2018  . Malignant neoplasm of upper-outer quadrant of right breast in female, estrogen receptor negative ([place]) 05/09/2018  . Low iron stores 01/16/2018  . Muscle spasm 01/15/2018  . Piriformis syndrome of left side 01/15/2018  . Grade I hemorrhoids 02/23/2017  . Low lying posterior placenta, antepartum 02/01/2016  . AMA (advanced maternal age) multigravida 35+ 11/22/2015  . 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 Calcification of right breast 02/19/2015  . Ligament tear 07/05/2012    Earlie Counts, PT 02/20/19 10:12 AM   [facility] [address] [place], [place], [address] Phone: [phone]   Fax:  [phone]  Name: Darlene Grant MRN: [id number] Date of Birth: August 24, 1975

## 2019-02-20 NOTE — Patient Instructions (Addendum)
Moisturizers . They are used in the vagina to hydrate the mucous membrane that make up the vaginal canal. . Designed to keep a more normal acid balance (ph) . Once placed in the vagina, it will last between two to three days.  . Use 2-3 times per week at bedtime and last longer than 60 min. . Ingredients to avoid is glycerin and fragrance, can increase chance of infection . Should not be used just before sex due to causing irritation . Most are gels administered either in a tampon-shaped applicator or as a vaginal suppository. They are non-hormonal.   Types of Moisturizers . Samul Dada- drug store . Vitamin E vaginal suppositories- Whole foods, Amazon . Moist Again . Coconut oil- can break down condoms . Julva- (Do no use if on Tamoxifen) amazon . Yes moisturizer- amazon . NeuEve Silk , NeuEve Silver for menopausal or over 65 (if have severe vaginal atrophy or cancer treatments use NeuEve Silk for  1 month than move to The Pepsi)- [employer], MapleFlower.dk . Olive and Bee intimate cream- www.oliveandbee.com.au . Mae vaginal moisturizer- Amazon  Creams to use externally on the Vulva area  Albertson's (good for for cancer patients that had radiation to the area)- [place] or [employer].FlyingBasics.com.br  V-magic cream - amazon  Julva-amazon  Vital "V Wild Yam salve ( help moisturize and help with thinning vulvar area, does have [place] by Damiva labial moisturizer ([place],    Things to avoid in the vaginal area . Do not use things to irritate the vulvar area . No lotions just specialized creams for the vulva area- Neogyn, V-magic, No soaps; can use Aveeno or Calendula cleanser if needed. Must be gentle . No deodorants . No douches . Good to sleep without underwear to let the vaginal area to air out . No scrubbing: spread the lips to let warm water rinse over labias and pat dry   Lubrication . Used  for intercourse to reduce friction . Avoid ones that have glycerin, warming gels, tingling gels, icing or cooling gel, scented . Avoid parabens due to a preservative similar to female sex hormone . May need to be reapplied once or several times during sexual activity . Can be applied to both partners genitals prior to vaginal penetration to minimize friction or irritation . Prevent irritation and mucosal tears that cause post coital pain and increased the risk of vaginal and urinary tract infections . Oil-based lubricants cannot be used with condoms due to breaking them down.  Least likely to irritate vaginal tissue.  . Plant based-lubes are safe . Silicone-based lubrication are thicker and last long and used for post-menopausal women  Vaginal Lubricators Here is a list of some suggested lubricators you can use for intercourse. Use the most hypoallergenic product.  You can place on you or your partner.   Slippery Stuff  Sylk or Sliquid Natural H2O ( good  if frequent UTI's)  Blossom Organics (www.blossom-organics.com)  Luvena   Coconut oil  PJur Woman Nude- water based lubricant, amazon  Uberlube- Amazon  Aloe Vera  Yes lubricant- [employer] Platinum-Silicone, Target, Walgreens  Olive and Bee intimate cream-  www.oliveandbee.com.au  Pink - Amazon Things to avoid in lubricants are glycerin, warming gels, tingling gels, icing or cooling  gels, and scented gels.  Also avoid Vaseline. KY jelly, Replens, and Astroglide kills good bacteria(lactobacilli)  Things to avoid in the vaginal area . Do not use  things to irritate the vulvar area . No lotions- see below . No soaps; can use Aveeno or Calendula cleanser if needed. Must be gentle . No deodorants . No douches . Good to sleep without underwear to let the vaginal area to air out . No scrubbing: spread the lips to let warm water rinse over labias and pat dry  Creams that can be used on the Henry Rele[employer]um or [employer] Code: [id number]  URL: [email or web address]  Date: 02/20/2019  Prepared by: Earlie Counts   Exercises Seated Quadratus Lumborum Stretch with Arm Overhead - 2 reps                   - 1 sets - 30 sec hold - 1x daily - 7x weekly Seated Piriformis Stretch with Trunk Bend - 2 reps - 1 sets - 30 sec hold - 1x daily - 7x weekly Seated Hamstring Stretch - 2 reps - 1 sets - 30 sec hold - 1x daily - 7x weekly Supine Bridge - 15 reps - 1 sets - 1x daily - 7x weekly Supine Transversus Abdominis Bracing - Hands on Stomach - 10 reps - 1 sets - 5 sec hold - 1x daily - 7x weekly Bent Knee Fallouts - 10 reps - 1 sets - 1x daily - 7x weekly Patient Education Trigger [facility] [address] [place], [place] [address] Phone # [phone] Fax [phone]

## 2019-02-20 NOTE — Progress Notes (Signed)
  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 She reported moderate fatigue throughout treatment. She also reported some pain and tightness to the mastectomy site and underarm. She experienced increasing skin hyperpigmentation with some peeling, but without moist desquamation.  Plan: The patient will follow-up with radiation oncology in 1 month.  ________________________________________________ -----------------------------------  Blair Promise, PhD, MD

## 2019-02-26 ENCOUNTER — Other Ambulatory Visit: Payer: Self-pay

## 2019-02-26 ENCOUNTER — Encounter: Payer: Self-pay | Admitting: Physical Therapy

## 2019-02-26 ENCOUNTER — Ambulatory Visit: Payer: BC Managed Care – PPO | Admitting: Physical Therapy

## 2019-02-26 DIAGNOSIS — Z483 Aftercare following surgery for neoplasm: Secondary | ICD-10-CM | POA: Diagnosis not present

## 2019-02-26 DIAGNOSIS — M25552 Pain in left hip: Secondary | ICD-10-CM | POA: Diagnosis not present

## 2019-02-26 DIAGNOSIS — M25612 Stiffness of left shoulder, not elsewhere classified: Secondary | ICD-10-CM | POA: Diagnosis not present

## 2019-02-26 DIAGNOSIS — M25511 Pain in right shoulder: Secondary | ICD-10-CM

## 2019-02-26 DIAGNOSIS — M25611 Stiffness of right shoulder, not elsewhere classified: Secondary | ICD-10-CM | POA: Diagnosis not present

## 2019-02-26 DIAGNOSIS — M6281 Muscle weakness (generalized): Secondary | ICD-10-CM | POA: Diagnosis not present

## 2019-02-26 DIAGNOSIS — R293 Abnormal posture: Secondary | ICD-10-CM

## 2019-02-26 NOTE — Patient Instructions (Signed)
Access Code: [email or web address]  URL: [email or web address]  Date: 02/26/2019  Prepared by: Earlie Counts   Exercises Seated Quadratus Lumborum Stretch with Arm Overhead - 2 reps                   - 1 sets - 30 sec hold - 1x daily - 7x weekly Seated Piriformis Stretch with Trunk Bend - 2 reps - 1 sets - 30 sec hold - 1x daily - 7x weekly Seated Hamstring Stretch - 2 reps - 1 sets - 30 sec hold - 1x daily - 7x weekly Supine Bridge - 15 reps - 1 sets - 1x daily - 7x weekly Supine Transversus Abdominis Bracing - Hands on Stomach - 10 reps - 1 sets - 5 sec hold - 1x daily - 7x weekly Bent Knee Fallouts - 10 reps - 1 sets - 1x daily - 7x weekly Hip Abduction with Resistance Loop - 10 reps - 1 sets - 1x daily - 7x weekly Standing Hip Extension Kicks - 10 reps - 1 sets - 1x daily - 7x weekly Patient Education Trigger [facility] [address], [place], [place] [address] Phone # [phone] Fax [phone]

## 2019-02-26 NOTE — Therapy (Signed)
[facility] [address], [place], [place], [address] Phone: [phone]   Fax:  [phone]  Physical Therapy Treatment  Patient Details  Name: Darlene Grant MRN: [id number] Date of Birth: 17-Mar-1976 Referring Provider (PT): Dr. Nicholas Lose; Dr. Clovia Cuff   Encounter Date: 02/26/2019  PT End of Session - 02/26/19 1014    Visit Number  16    Date for PT Re-Evaluation  04/28/19    Authorization Type  BCBS    PT Start Time  0945    PT Stop Time  1108    PT Time Calculation (min)  83 min    Activity Tolerance  Patient tolerated treatment well;No increased pain    Behavior During Therapy  WFL for tasks assessed/performed       Past Medical History:  Diagnosis Date  . Abnormal Pap smear    ASCUS 03/13/11  . Anemia   . Breast pain, left   . Cancer ([place])   . Candida vaginitis   . Complication of anesthesia    pt states that all narcotics make her angry and she would prefer to avoid them.  . Rectal itching   . Vaginal Pap smear, abnormal     Past Surgical History:  Procedure Laterality Date  . BREAST BIOPSY Right 8/16  . CESAREAN SECTION N/A 11/21/2013   Procedure: CESAREAN SECTION;  Surgeon: Alwyn Pea, MD;  Location: [facility];  Service: Obstetrics;  Laterality: N/A;  . CESAREAN SECTION N/A 06/23/2016   Procedure: CESAREAN SECTION;  Surgeon: Emily Filbert, MD;  Location: [facility];  Service: Obstetrics;  Laterality: N/A;  . HAND SURGERY  04/2012   ligament repair  . MASTECTOMY WITH AXILLARY LYMPH NODE DISSECTION Bilateral 11/14/2018   Procedure: RIGHT MASTECTOMY WITH RIGHT AXILLARY LYMPH NODE DISSECTION AND LEFT RISK REDUCING MASTECTOMY;  Surgeon: Rolm Bookbinder, MD;  Location: [facility];  Service: General;  Laterality: Bilateral;  LATE ENTRY: Corrected laterality documentation to read right axillary lymph node dissection vs. documented laterality of left.    Marland Kitchen PILONIDAL  CYST EXCISION  2000  . PORTACATH PLACEMENT N/A 05/28/2018   Procedure: INSERTION PORT-A-CATH WITH ULTRASOUND;  Surgeon: Rolm Bookbinder, MD;  Location: [facility];  Service: General;  Laterality: N/A;  . WISDOM TOOTH EXTRACTION  AGE 43    There were no vitals filed for this visit.  Subjective Assessment - 02/26/19 0950    Subjective  I was worn out after last visit. I started exercise over the weekend. I feel the hip has evened out. Patient reports some redness and pulling in the right chest wall. I finished my radiation on 02/10/19.    Pertinent History  breast cancer diagnosed November 2019: triple negative with Ki of 75% She completed chemotherapy 10/18/2018. 11/14/2018: she underwent bilateral mastectomy with 0/13 nodes positive on the right; radiation ended on 02/10/2019    Patient Stated Goals  sleep without pain, exercise without discomfort, carry daughter on left due to radiation on the right breast    Currently in Pain?  Yes    Pain Score  1     Pain Location  Hip    Pain Orientation  Left    Pain Descriptors / Indicators  Sore    Pain Type  Chronic pain    Pain Onset  More than a month ago    Pain Frequency  Constant    Aggravating Factors   step on left foot and extend the  left hip, laying on left side, carrying daughter on left hip    Pain Relieving Factors  lay on right side    Multiple Pain Sites  Yes    Pain Score  2    Pain Location  Shoulder    Pain Orientation  Left    Pain Descriptors / Indicators  Aching;Sore    Pain Type  Acute pain    Pain Onset  In the past 7 days    Pain Frequency  Intermittent    Aggravating Factors   raising left arm and picking up daughter    Pain Relieving Factors  arm at side                       [facility] Adult PT Treatment/Exercise - 02/26/19 0001      Lumbar Exercises: Supine   Bridge  15 reps    Bridge Limitations  keping hips leveled       Knee/Hip Exercises: Aerobic   Nustep  level 3 just legs for 5  min while assessing patient      Knee/Hip Exercises: Standing   Hip Abduction  Stengthening;Right;Left;1 set;10 reps;Knee straight    Abduction Limitations  red band around thighs    Hip Extension  Stengthening;Right;Left;1 set;10 reps;Knee straight    Extension Limitations  red band around thighs      Shoulder Exercises: Pulleys   Flexion  2 minutes    Flexion Limitations  therapist distraction the left shoulder    ABduction  2 minutes    ABduction Limitations  therapist distraction of the left shoulder      Shoulder Exercises: ROM/Strengthening   Wall Pushups  10 reps    Other ROM/Strengthening Exercises  UE ranger for left shoulder and right shoulder with therapist guiding the scapula and breath; flexion for right and left ; right and left large circles     Other ROM/Strengthening Exercises  walk ball up wall 10 times with both hands      Manual Therapy   Manual Therapy  Soft tissue mobilization    Manual therapy comments  manually stretched left ITB and iliopsoas muscle    Soft tissue mobilization  left pectiralis, intercoastalis, RTC insertion while stretching the right shoulder into abduction; left quadratus, left internal obliques, left iliopsoas             PT Education - 02/26/19 1101    Education Details  [id number]    Person(s) Educated  Patient    Methods  Explanation;Demonstration;Handout    Comprehension  Verbalized understanding;Returned demonstration       PT Short Term Goals - 02/20/19 0935      PT SHORT TERM GOAL #1   Title  independent with initial HEP for left hip    Time  4    Period  Weeks    Status  Achieved    Target Date  03/03/19      PT SHORT TERM GOAL #2   Title  understand how to take care of vaginal health to reduce dryness and keep tissue mobility    Time  4    Period  Weeks    Status  Achieved    Target Date  03/03/19        PT Long Term Goals - 02/26/19 1052      PT LONG TERM GOAL #3   Title  Pt will be independent in a home  exercise program for shoulder ROM and strength  Baseline  still learning    Time  4    Period  Weeks    Status  On-going      PT LONG TERM GOAL #4   Title  Pt to report less tightness with OH reaching by at least 50% at Lt axilla.    Baseline  still has issues    Status  On-going      PT LONG TERM GOAL #5   Title  Patient able to ambulate without left hip pain due to pelvis in correct alignment    Baseline  hip pain improved by 50%    Time  12    Period  Weeks    Status  On-going      PT LONG TERM GOAL #6   Title  able to lay on left hip for sleeping with minimal to no pain    Baseline  505 better    Time  12    Period  Weeks    Status  On-going      PT LONG TERM GOAL #7   Title  able to hold her child on the left hip due to the radiation soreness on the right breast with pain minimal to none    Baseline  50% better    Time  12    Period  Weeks    Status  On-going            Plan - 02/26/19 1015    Clinical Impression Statement  Patient has left shoulder pain with overhead reaching with tightness in the left pectoralis. Patient was able to tolerate laying on right side better today. Patient ended her radiation on 02/10/2019. Paitent is working on hip strengthening. Patient has 50% less pain in left hip with walking, stairs, laying on left hip and carrying her child. Patient will benefit from skilled therapy to improve right shoulder mobility, reduce right hip apin and education on vaginal health.    Personal Factors and Comorbidities  Comorbidity 1;Sex    Comorbidities  recent chemo    Examination-Activity Limitations  Bathing;Reach Overhead;Caring for Others;Lift;Hygiene/Grooming;Locomotion Level;Sleep    Stability/Clinical Decision Making  Evolving/Moderate complexity    Rehab Potential  Good    PT Frequency  1x / week    PT Duration  12 weeks    PT Treatment/Interventions  ADLs/Self Care Home Management;Therapeutic exercise;Manual lymph drainage;Manual  techniques;Biofeedback;Electrical Stimulation;Therapeutic activities;Neuromuscular re-education;Patient/family education;Dry needling;Passive range of motion;Spinal Manipulations    PT Next Visit Plan  Cont focusing on end ROM for bil shoulders and closely monitor skin at Rt chest wall. Will need to place pt on hold if redness worsens and pt feels skin pulling, or just stop treatment for Rt continuing with Lt prn spinal mobs; core strength    PT Home Exercise Plan  ;Access Code: [email or web address]    Consulted and Agree with Plan of Care  Patient       Patient will benefit from skilled therapeutic intervention in order to improve the following deficits and impairments:  Decreased knowledge of precautions, Decreased knowledge of use of DME, Postural dysfunction, Decreased range of motion, Increased fascial restricitons, Impaired UE functional use, Increased muscle spasms, Impaired perceived functional ability, Pain, Decreased scar mobility, Decreased strength, Increased edema, Difficulty walking, Decreased activity tolerance, Decreased mobility  Visit Diagnosis: Pain in left hip  Stiffness of right shoulder joint  Acute pain of right shoulder  Aftercare following surgery for neoplasm  Abnormal posture  Muscle weakness (generalized)  Stiffness of left shoulder, not  elsewhere classified     Problem List Patient Active Problem List   Diagnosis Date Noted  . History of right breast cancer 11/14/2018  . Chemotherapy induced neutropenia ([place]) 09/13/2018  . Port-A-Cath in place 06/10/2018  . Malignant neoplasm of upper-outer quadrant of right breast in female, estrogen receptor negative ([place]) 05/09/2018  . Low iron stores 01/16/2018  . Muscle spasm 01/15/2018  . Piriformis syndrome of left side 01/15/2018  . Grade I hemorrhoids 02/23/2017  . Low lying posterior placenta, antepartum 02/01/2016  . AMA (advanced maternal age) multigravida 35+ 11/22/2015  . Fibroadenoma of right breast 04/05/2015   . Calcification of right breast 02/19/2015  . Ligament tear 07/05/2012    Earlie Counts, PT 02/26/19 11:10 AM    [facility] [address], [place], [place], [address] Phone: [phone]   Fax:  [phone]  Name: Darlene Grant MRN: [id number] Date of Birth: 06/19/1976

## 2019-03-03 ENCOUNTER — Encounter: Payer: BC Managed Care – PPO | Admitting: Physical Therapy

## 2019-03-06 ENCOUNTER — Ambulatory Visit: Payer: BC Managed Care – PPO | Admitting: Physical Therapy

## 2019-03-06 ENCOUNTER — Other Ambulatory Visit: Payer: Self-pay

## 2019-03-06 ENCOUNTER — Encounter: Payer: Self-pay | Admitting: Physical Therapy

## 2019-03-06 DIAGNOSIS — M25611 Stiffness of right shoulder, not elsewhere classified: Secondary | ICD-10-CM

## 2019-03-06 DIAGNOSIS — M25552 Pain in left hip: Secondary | ICD-10-CM | POA: Diagnosis not present

## 2019-03-06 DIAGNOSIS — Z483 Aftercare following surgery for neoplasm: Secondary | ICD-10-CM | POA: Diagnosis not present

## 2019-03-06 DIAGNOSIS — M6281 Muscle weakness (generalized): Secondary | ICD-10-CM

## 2019-03-06 DIAGNOSIS — M25511 Pain in right shoulder: Secondary | ICD-10-CM | POA: Diagnosis not present

## 2019-03-06 DIAGNOSIS — R293 Abnormal posture: Secondary | ICD-10-CM | POA: Diagnosis not present

## 2019-03-06 DIAGNOSIS — M25612 Stiffness of left shoulder, not elsewhere classified: Secondary | ICD-10-CM | POA: Diagnosis not present

## 2019-03-06 NOTE — Therapy (Signed)
[facility] [address] [place], [place], [address] Phone: [phone]   Fax:  [phone]  Physical Therapy Treatment  Patient Details  Name: Darlene Grant MRN: [id number] Date of Birth: 22-Oct-1975 Referring Provider (PT): Dr. Nicholas Lose; Dr. Clovia Cuff   Encounter Date: 03/06/2019  PT End of Session - 03/06/19 1009    Visit Number  17    Date for PT Re-Evaluation  04/28/19    Authorization Type  BCBS    PT Start Time  0845    PT Stop Time  1010    PT Time Calculation (min)  85 min    Activity Tolerance  Patient tolerated treatment well;No increased pain    Behavior During Therapy  WFL for tasks assessed/performed       Past Medical History:  Diagnosis Date  . Abnormal Pap smear    ASCUS 03/13/11  . Anemia   . Breast pain, left   . Cancer ([place])   . Candida vaginitis   . Complication of anesthesia    pt states that all narcotics make her angry and she would prefer to avoid them.  . Rectal itching   . Vaginal Pap smear, abnormal     Past Surgical History:  Procedure Laterality Date  . BREAST BIOPSY Right 8/16  . CESAREAN SECTION N/A 11/21/2013   Procedure: CESAREAN SECTION;  Surgeon: Alwyn Pea, MD;  Location: [facility];  Service: Obstetrics;  Laterality: N/A;  . CESAREAN SECTION N/A 06/23/2016   Procedure: CESAREAN SECTION;  Surgeon: Emily Filbert, MD;  Location: [place];  Service: Obstetrics;  Laterality: N/A;  . HAND SURGERY  04/2012   ligament repair  . MASTECTOMY WITH AXILLARY LYMPH NODE DISSECTION Bilateral 11/14/2018   Procedure: RIGHT MASTECTOMY WITH RIGHT AXILLARY LYMPH NODE DISSECTION AND LEFT RISK REDUCING MASTECTOMY;  Surgeon: Rolm Bookbinder, MD;  Location: [place];  Service: General;  Laterality: Bilateral;  LATE ENTRY: Corrected laterality documentation to read right axillary lymph node dissection vs. documented laterality of left.    Marland Kitchen PILONIDAL  CYST EXCISION  2000  . PORTACATH PLACEMENT N/A 05/28/2018   Procedure: INSERTION PORT-A-CATH WITH ULTRASOUND;  Surgeon: Rolm Bookbinder, MD;  Location: [place];  Service: General;  Laterality: N/A;  . WISDOM TOOTH EXTRACTION  AGE 70    There were no vitals filed for this visit.  Subjective Assessment - 03/06/19 0858    Subjective  I felt better after last visit but the last few days I have had increased left shoulder and left hip pain. My shoulder has popped 10times since last visit. I feel like I need to get the scapula moving.    Pertinent History  breast cancer diagnosed November 2019: triple negative with Ki of 75% She completed chemotherapy 10/18/2018. 11/14/2018: she underwent bilateral mastectomy with 0/13 nodes positive on the right; radiation ended on 02/10/2019    Patient Stated Goals  sleep without pain, exercise without discomfort, carry daughter on left due to radiation on the right breast    Currently in Pain?  Yes    Pain Score  3     Pain Location  Hip    Pain Orientation  Left    Pain Descriptors / Indicators  Sore    Pain Type  Chronic pain    Pain Onset  More than a month ago    Pain Frequency  Constant    Aggravating Factors   step on left foot and  extend the left hip, laying on left side, carrying daughter on left hip    Pain Relieving Factors  stretching, walking, ice    Multiple Pain Sites  Yes    Pain Score  3    Pain Location  Shoulder    Pain Orientation  Left    Pain Descriptors / Indicators  Aching;Sore    Pain Type  Acute pain    Pain Onset  More than a month ago    Pain Frequency  Constant    Aggravating Factors   raising left arm and picking up daughter    Pain Relieving Factors  when I feel it pop         [facility] PT Assessment - 03/06/19 0001      Observation/Other Assessments   Skin Integrity  skin in the right chest and axilla area is healing well and scars looked good                   [facility] Adult PT  Treatment/Exercise - 03/06/19 0001      Lumbar Exercises: Stretches   Quad Stretch  Right;Left;1 rep;30 seconds   standing   Other Lumbar Stretch Exercise  stand with left foot behind right and abduct left arm then switch    Other Lumbar Stretch Exercise  doorway stretch with one leg ahead hold 15 sec then switch 2 times      Knee/Hip Exercises: Standing   Forward Lunges  Right;Left;1 set;5 reps    Forward Lunges Limitations  wth bil. shoulder flexion    Hip Extension  Stengthening;Right;Left;1 set;10 reps;Knee bent   leaning on mat with scapula retraction and Tacile cues    Other Standing Knee Exercises  wall push ups with core engaged    Other Standing Knee Exercises  dead lift with scapula squeeze and working hamstring; lean forward with rowing 10x wiht 3# in each hand;       Shoulder Exercises: ROM/Strengthening   Other ROM/Strengthening Exercises  UE ranger for left shoulder and right shoulder with therapist guiding the scapula and breath; flexion for right and left ; right and left large circles       Manual Therapy   Manual Therapy  Soft tissue mobilization;Scapular mobilization    Soft tissue mobilization  left iliopsoas, left quadratus, left upper trap, left rhomboid, left levator scaputle, left subscapularis, left axilla    Scapular Mobilization  left scapula lift in sidely       Trigger Point Dry Needling - 03/06/19 0001    Consent Given?  Yes    Education Handout Provided  Previously provided    Muscles Treated Head and Neck  Upper trapezius;Levator scapulae   left   Muscles Treated Upper Quadrant  Rhomboids;Subscapularis   left   Muscles Treated Back/Hip  Iliopsoas;Quadratus lumborum   left   Upper Trapezius Response  Twitch reponse elicited;Palpable increased muscle length    Levator Scapulae Response  Twitch response elicited;Palpable increased muscle length    Rhomboids Response  Twitch response elicited;Palpable increased muscle length    Subscapularis Response   Twitch response elicited;Palpable increased muscle length    Iliopsoas Response  Twitch response elicited;Palpable increased muscle length    Quadratus Lumborum Response  Twitch response elicited;Palpable increased muscle length           PT Education - 03/06/19 1008    Education Details  video taped her exercises to know how to do them correctly    Person(s) Educated  Patient  Methods  Explanation;Demonstration;Other (comment)   video on her phone   Comprehension  Verbalized understanding;Returned demonstration       PT Short Term Goals - 02/20/19 0935      PT SHORT TERM GOAL #1   Title  independent with initial HEP for left hip    Time  4    Period  Weeks    Status  Achieved    Target Date  03/03/19      PT SHORT TERM GOAL #2   Title  understand how to take care of vaginal health to reduce dryness and keep tissue mobility    Time  4    Period  Weeks    Status  Achieved    Target Date  03/03/19        PT Long Term Goals - 03/06/19 1014      PT LONG TERM GOAL #1   Title  Pt will have 150 degrees of painfree right shoulder abduction so that she can perform her household chores easier    Baseline  108 with pain; 164 degrees without pain-01/15/19    Time  4    Period  Weeks    Status  Achieved      PT LONG TERM GOAL #2   Title  Pt will report 50% decrease in right shoulder tightness and pulling from axillary cording    Baseline  75% reported at this time-01/15/19    Time  4    Period  Weeks    Status  Achieved      PT LONG TERM GOAL #3   Title  Pt will be independent in a home exercise program for shoulder ROM and strength    Baseline  still learning    Time  4    Period  Weeks    Status  On-going      PT LONG TERM GOAL #4   Title  Pt to report less tightness with OH reaching by at least 50% at Lt axilla.    Baseline  still has issues    Status  On-going      PT LONG TERM GOAL #5   Title  Patient able to ambulate without left hip pain due to pelvis in  correct alignment    Baseline  hip pain improved by 50%    Time  12    Period  Weeks    Status  On-going      PT LONG TERM GOAL #6   Title  able to lay on left hip for sleeping with minimal to no pain    Time  12    Period  Weeks    Status  On-going      PT LONG TERM GOAL #7   Title  able to hold her child on the left hip due to the radiation soreness on the right breast with pain minimal to none    Baseline  50% better    Time  12    Period  Weeks    Status  On-going            Plan - 03/06/19 0903    Clinical Impression Statement  Patient had tightness in the left scapula muscles and along the axilla causing dysfunctiona scapula humeral rythm. After manual work patient was able to move left shoulder with less pain and greater ease. Patient is now working the core while working on her shoulder with tactile cues to keep spinal nuetral and correct alignment. Patient  has no urinary leakage. Patient reports improve vaginal dryness and uses lubrication with intercourse. Patient has full left shoulder ROM after dry needling. Patient right chest area is healing well and scars are healing well.  Patient will benefit from skilled therapy to improve right hsoulder mobility, reduce right hip pain and improve overall strength.    Personal Factors and Comorbidities  Comorbidity 1;Sex    Comorbidities  recent chemo    Examination-Activity Limitations  Bathing;Reach Overhead;Caring for Others;Lift;Hygiene/Grooming;Locomotion Level;Sleep    Stability/Clinical Decision Making  Evolving/Moderate complexity    Rehab Potential  Good    PT Frequency  1x / week    PT Duration  12 weeks    PT Treatment/Interventions  ADLs/Self Care Home Management;Therapeutic exercise;Manual lymph drainage;Manual techniques;Biofeedback;Electrical Stimulation;Therapeutic activities;Neuromuscular re-education;Patient/family education;Dry needling;Passive range of motion;Spinal Manipulations    PT Next Visit Plan  work on  shoulder ROM, hip and core strength    PT Home Exercise Plan  ;Access Code: [email or web address]    Consulted and Agree with Plan of Care  Patient       Patient will benefit from skilled therapeutic intervention in order to improve the following deficits and impairments:  Decreased knowledge of precautions, Decreased knowledge of use of DME, Postural dysfunction, Decreased range of motion, Increased fascial restricitons, Impaired UE functional use, Increased muscle spasms, Impaired perceived functional ability, Pain, Decreased scar mobility, Decreased strength, Increased edema, Difficulty walking, Decreased activity tolerance, Decreased mobility  Visit Diagnosis: Pain in left hip  Stiffness of right shoulder joint  Acute pain of right shoulder  Aftercare following surgery for neoplasm  Abnormal posture  Muscle weakness (generalized)  Stiffness of left shoulder, not elsewhere classified     Problem List Patient Active Problem List   Diagnosis Date Noted  . History of right breast cancer 11/14/2018  . Chemotherapy induced neutropenia ([place]) 09/13/2018  . Port-A-Cath in place 06/10/2018  . Malignant neoplasm of upper-outer quadrant of right breast in female, estrogen receptor negative ([place]) 05/09/2018  . Low iron stores 01/16/2018  . Muscle spasm 01/15/2018  . Piriformis syndrome of left side 01/15/2018  . Grade I hemorrhoids 02/23/2017  . Low lying posterior placenta, antepartum 02/01/2016  . AMA (advanced maternal age) multigravida 35+ 11/22/2015  . 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 Calcification of right breast 02/19/2015  . Ligament tear 07/05/2012    Earlie Counts, PT 03/06/19 10:16 AM   [facility] [address] [place], [place], [address] Phone: [phone]   Fax:  [phone]  Name: Darlene Grant MRN: [id number] Date of Birth: 1976/03/31

## 2019-03-09 ENCOUNTER — Encounter: Payer: Self-pay | Admitting: Physical Therapy

## 2019-03-13 ENCOUNTER — Encounter: Payer: Self-pay | Admitting: Physical Therapy

## 2019-03-13 ENCOUNTER — Other Ambulatory Visit: Payer: Self-pay

## 2019-03-13 ENCOUNTER — Ambulatory Visit: Payer: BC Managed Care – PPO | Admitting: Physical Therapy

## 2019-03-13 ENCOUNTER — Ambulatory Visit: Payer: Self-pay | Admitting: Radiation Oncology

## 2019-03-13 DIAGNOSIS — M25611 Stiffness of right shoulder, not elsewhere classified: Secondary | ICD-10-CM | POA: Diagnosis not present

## 2019-03-13 DIAGNOSIS — M25511 Pain in right shoulder: Secondary | ICD-10-CM

## 2019-03-13 DIAGNOSIS — Z483 Aftercare following surgery for neoplasm: Secondary | ICD-10-CM

## 2019-03-13 DIAGNOSIS — M25612 Stiffness of left shoulder, not elsewhere classified: Secondary | ICD-10-CM

## 2019-03-13 DIAGNOSIS — M6281 Muscle weakness (generalized): Secondary | ICD-10-CM | POA: Diagnosis not present

## 2019-03-13 DIAGNOSIS — R293 Abnormal posture: Secondary | ICD-10-CM

## 2019-03-13 DIAGNOSIS — M25552 Pain in left hip: Secondary | ICD-10-CM

## 2019-03-13 NOTE — Therapy (Signed)
[facility] [address] [place], [place], [address] Phone: [phone]   Fax:  [phone]  Physical Therapy Treatment  Patient Details  Name: Darlene Grant MRN: [id number] Date of Birth: 06/11/1976 Referring Provider (PT): Dr. Nicholas Lose; Dr. Clovia Cuff   Encounter Date: 03/13/2019  PT End of Session - 03/13/19 1007    Visit Number  18    Date for PT Re-Evaluation  04/28/19    Authorization Type  BCBS    PT Start Time  0845    PT Stop Time  1008    PT Time Calculation (min)  83 min    Activity Tolerance  Patient tolerated treatment well;No increased pain    Behavior During Therapy  WFL for tasks assessed/performed       Past Medical History:  Diagnosis Date  . Abnormal Pap smear    ASCUS 03/13/11  . Anemia   . Breast pain, left   . Cancer ([place])   . Candida vaginitis   . Complication of anesthesia    pt states that all narcotics make her angry and she would prefer to avoid them.  . Rectal itching   . Vaginal Pap smear, abnormal     Past Surgical History:  Procedure Laterality Date  . BREAST BIOPSY Right 8/16  . CESAREAN SECTION N/A 11/21/2013   Procedure: CESAREAN SECTION;  Surgeon: Alwyn Pea, MD;  Location: [facility];  Service: Obstetrics;  Laterality: N/A;  . CESAREAN SECTION N/A 06/23/2016   Procedure: CESAREAN SECTION;  Surgeon: Emily Filbert, MD;  Location: [place];  Service: Obstetrics;  Laterality: N/A;  . HAND SURGERY  04/2012   ligament repair  . MASTECTOMY WITH AXILLARY LYMPH NODE DISSECTION Bilateral 11/14/2018   Procedure: RIGHT MASTECTOMY WITH RIGHT AXILLARY LYMPH NODE DISSECTION AND LEFT RISK REDUCING MASTECTOMY;  Surgeon: Rolm Bookbinder, MD;  Location: [place];  Service: General;  Laterality: Bilateral;  LATE ENTRY: Corrected laterality documentation to read right axillary lymph node dissection vs. documented laterality of left.    Marland Kitchen PILONIDAL  CYST EXCISION  2000  . PORTACATH PLACEMENT N/A 05/28/2018   Procedure: INSERTION PORT-A-CATH WITH ULTRASOUND;  Surgeon: Rolm Bookbinder, MD;  Location: [place];  Service: General;  Laterality: N/A;  . WISDOM TOOTH EXTRACTION  AGE 43    There were no vitals filed for this visit.  Subjective Assessment - 03/13/19 0858    Subjective  I am tired after therapy. My shoulder felt looser. I am able to do more this week. I have exercised more cardio. I have some swelling in the right axilla and right arm. I am wearing compression. Laying on left side is 75% better. I am not carrying my daughter on the left side.    Pertinent History  breast cancer diagnosed November 2019: triple negative with Ki of 75% She completed chemotherapy 10/18/2018. 11/14/2018: she underwent bilateral mastectomy with 0/13 nodes positive on the right; radiation ended on 02/10/2019    Patient Stated Goals  sleep without pain, exercise without discomfort, carry daughter on left due to radiation on the right breast    Currently in Pain?  Yes    Pain Score  2     Pain Location  Hip    Pain Orientation  Left    Pain Descriptors / Indicators  Sore    Pain Type  Chronic pain    Pain Onset  More than a month ago    Pain  Frequency  Constant    Aggravating Factors   step on left foot and extend the left hip    Pain Relieving Factors  stretching, walking, ice    Multiple Pain Sites  Yes    Pain Score  3    Pain Location  Shoulder    Pain Orientation  Left    Pain Descriptors / Indicators  Aching;Sore    Pain Type  Acute pain    Pain Onset  More than a month ago    Pain Frequency  Constant    Aggravating Factors   raising left arm and picking up daughter    Pain Relieving Factors  when I feel it pop                       [facility] Adult PT Treatment/Exercise - 03/13/19 0001      Lumbar Exercises: Stretches   Piriformis Stretch  Right;Left;30 seconds;2 reps    Piriformis Stretch Limitations  then  bring leg to opposite side for trunk rotation      Lumbar Exercises: Supine   Bent Knee Raise  20 reps;1 second   each side   Bent Knee Raise Limitations  tactile cues to keep opposite hip down and contract abdominals      Knee/Hip Exercises: Aerobic   Nustep  level 10 just legs for while assessing patient      Knee/Hip Exercises: Prone   Straight Leg Raises  Strengthening;Left;1 set   30 reps   Other Prone Exercises  extend left hip then abduct 15 times      Shoulder Exercises: Supine   Flexion  Strengthening;Right;Left;10 reps   whiel on foam roll   Other Supine Exercises  lay on foam roll with stretching pectoralis      Shoulder Exercises: Seated   Other Seated Exercises  sit with hands on green physioball and roll forward and side to side to stretch shoulders and back      Manual Therapy   Manual Therapy  Soft tissue mobilization;Joint mobilization    Manual therapy comments  manually stretched left hip rotators    Joint Mobilization  left shoulder grade 3 for inferior, anterior, and posterior glide; anterior left hip glide grade 3 in prone    Soft tissue mobilization  transverse friction massage to left RTC tendon, soft tissue work to bil. pectoralis while on foam roll; using the addaday to massage the left lumbar and thoracic paraspinals, quadratus lumborum, rhomboids, RTC muscle belly, upper trap and levator scapula       Trigger Point Dry Needling - 03/13/19 0001    Consent Given?  Yes    Education Handout Provided  Previously provided    Muscles Treated Head and Neck  Upper trapezius;Levator scapulae   left   Muscles Treated Upper Quadrant  Rhomboids;Subscapularis;Infraspinatus   left   Muscles Treated Back/Hip  Lumbar multifidi;Quadratus lumborum   left   Upper Trapezius Response  Twitch reponse elicited;Palpable increased muscle length    Levator Scapulae Response  Twitch response elicited;Palpable increased muscle length    Rhomboids Response  Twitch response  elicited;Palpable increased muscle length    Infraspinatus Response  Twitch response elicited;Palpable increased muscle length    Subscapularis Response  Twitch response elicited;Palpable increased muscle length    Lumbar multifidi Response  Twitch response elicited;Palpable increased muscle length    Quadratus Lumborum Response  Twitch response elicited;Palpable increased muscle length  PT Short Term Goals - 02/20/19 0935      PT SHORT TERM GOAL #1   Title  independent with initial HEP for left hip    Time  4    Period  Weeks    Status  Achieved    Target Date  03/03/19      PT SHORT TERM GOAL #2   Title  understand how to take care of vaginal health to reduce dryness and keep tissue mobility    Time  4    Period  Weeks    Status  Achieved    Target Date  03/03/19        PT Long Term Goals - 03/13/19 1014      PT LONG TERM GOAL #1   Title  Pt will have 150 degrees of painfree right shoulder abduction so that she can perform her household chores easier    Baseline  108 with pain; 164 degrees without pain-01/15/19    Time  4    Period  Weeks    Status  Achieved      PT LONG TERM GOAL #2   Title  Pt will report 50% decrease in right shoulder tightness and pulling from axillary cording    Baseline  75% reported at this time-01/15/19    Time  4    Period  Weeks    Status  Achieved      PT LONG TERM GOAL #3   Title  Pt will be independent in a home exercise program for shoulder ROM and strength    Baseline  still learning    Time  4    Period  Weeks    Status  On-going      PT LONG TERM GOAL #4   Title  Pt to report less tightness with OH reaching by at least 50% at Lt axilla.    Baseline  still has issues    Status  On-going      PT LONG TERM GOAL #5   Title  Patient able to ambulate without left hip pain due to pelvis in correct alignment    Baseline  hip pain improved by 75%    Time  12    Period  Weeks    Status  On-going      PT LONG TERM  GOAL #6   Title  able to lay on left hip for sleeping with minimal to no pain    Baseline  75% better    Time  12    Period  Weeks    Status  On-going      PT LONG TERM GOAL #7   Title  able to hold her child on the left hip due to the radiation soreness on the right breast with pain minimal to none    Baseline  50% better    Time  12    Period  Weeks    Status  On-going            Plan - 03/13/19 0944    Comorbidities  recent chemo       Patient will benefit from skilled therapeutic intervention in order to improve the following deficits and impairments:  Decreased knowledge of precautions, Decreased knowledge of use of DME, Postural dysfunction, Decreased range of motion, Increased fascial restricitons, Impaired UE functional use, Increased muscle spasms, Impaired perceived functional ability, Pain, Decreased scar mobility, Decreased strength, Increased edema, Difficulty walking, Decreased activity tolerance, Decreased mobility  Visit Diagnosis:  Pain in left hip  Stiffness of right shoulder joint  Acute pain of right shoulder  Aftercare following surgery for neoplasm  Abnormal posture  Muscle weakness (generalized)  Stiffness of left shoulder, not elsewhere classified     Problem List Patient Active Problem List   Diagnosis Date Noted  . History of right breast cancer 11/14/2018  . Chemotherapy induced neutropenia ([place]) 09/13/2018  . Port-A-Cath in place 06/10/2018  . Malignant neoplasm of upper-outer quadrant of right breast in female, estrogen receptor negative ([place]) 05/09/2018  . Low iron stores 01/16/2018  . Muscle spasm 01/15/2018  . Piriformis syndrome of left side 01/15/2018  . Grade I hemorrhoids 02/23/2017  . Low lying posterior placenta, antepartum 02/01/2016  . AMA (advanced maternal age) multigravida 35+ 11/22/2015  . 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 Calcification of right breast 02/19/2015  . Ligament tear 07/05/2012    Earlie Counts, PT 03/13/19 10:16 AM   [facility] [address] [place], [place], [address] Phone: [phone]   Fax:  [phone]  Name: Darlene Grant MRN: [id number] Date of Birth: 09/10/1975

## 2019-03-17 ENCOUNTER — Encounter: Payer: Self-pay | Admitting: Radiation Oncology

## 2019-03-17 ENCOUNTER — Ambulatory Visit
Admission: RE | Admit: 2019-03-17 | Discharge: 2019-03-17 | Disposition: A | Discharge status: Home / Self Care | Payer: BC Managed Care – PPO | Source: Ambulatory Visit | Attending: Radiation Oncology | Admitting: Radiation Oncology

## 2019-03-17 ENCOUNTER — Other Ambulatory Visit: Payer: Self-pay

## 2019-03-17 VITALS — BP 107/75 | HR 63 | Temp 98.7°F | Resp 16 | Ht 64.0 in | Wt 147.2 lb

## 2019-03-17 DIAGNOSIS — C50411 Malignant neoplasm of upper-outer quadrant of right female breast: Secondary | ICD-10-CM

## 2019-03-17 DIAGNOSIS — Z923 Personal history of irradiation: Secondary | ICD-10-CM | POA: Diagnosis not present

## 2019-03-17 DIAGNOSIS — Z171 Estrogen receptor negative status [ER-]: Secondary | ICD-10-CM

## 2019-03-17 DIAGNOSIS — Z853 Personal history of malignant neoplasm of breast: Secondary | ICD-10-CM | POA: Insufficient documentation

## 2019-03-17 DIAGNOSIS — Z79899 Other long term (current) drug therapy: Secondary | ICD-10-CM | POA: Insufficient documentation

## 2019-03-17 NOTE — Progress Notes (Signed)
Pt presents today for f/u with Dr. Sondra Come. Pt reports continued swelling in bilateral neck and RIGHT axilla. Pt denies c/o pain. Pt is attending PT for LEFT shoulder and LEFT hip. Pt reports using moisturizer mixed with vitamin E oil or argan oil on breast. Radiation field still with slight hyperpigmentation. Axilla is swollen and hyperpigmented.   BP 107/75 (BP Location: Left Arm, Patient Position: Sitting)   Pulse 63   Temp 98.7 F (37.1 C) (Temporal)   Resp 16   Ht 5\' 4"  (1.626 m)   Wt 147 lb 3.2 oz (66.8 kg)   SpO2 96%   BMI 25.27 kg/m   Wt Readings from Last 3 Encounters:  03/17/19 147 lb 3.2 oz (66.8 kg)  02/10/19 144 lb 6.4 oz (65.5 kg)  01/27/19 146 lb (66.2 kg)   Loma Sousa, RN BSN

## 2019-03-17 NOTE — Progress Notes (Signed)
Radiation Oncology         [phone] ________________________________  Name: Darlene Grant MRN: [id number]  Date: 03/17/2019  DOB: 10-24-1975  Follow-Up Visit Note  CC: Nicholas Lose, MD  Donella Stade, PA-C    ICD-10-CM   1. Malignant neoplasm of upper-outer quadrant of right breast in female, estrogen receptor negative ([place])  C50.411    Z17.1     Diagnosis:   Stage IIIB (ypT0, ypN0, M0) Right breast UOQ IDC, s/p mastectomy  Interval Since Last Radiation:  5 weeks  12/31/2018 through 02/10/2019 Site Technique Total Dose Dose per Fx Completed Fx Beam Energies  Thorax: CW_Rt 3D 50/50 2 25/25 6X, 10X  Thorax: CW_Rt_axilla 3D 45/45 1.8 25/25 6X, 10X  Thorax: CW_Rt_Boost_mast scar Electron 10/10 2 5/5 6E   Narrative:  The patient returns today for routine follow-up. She continues to attend physical therapy for her left shoulder and left hip. She endorses using moisturizer mixed with vitamin E or argan oil for her breast.   On review of systems, she reports continued swelling to her neck bilaterally and her right axilla. She denies any pain.   ALLERGIES:  is allergic to augmentin [amoxicillin-pot clavulanate]; hydrocodone; and keflex [cephalexin].  Meds: Current Outpatient Medications  Medication Sig Dispense Refill  . B Complex-C (B-COMPLEX WITH VITAMIN C) tablet Take 1 tablet by mouth daily.    . Calcium Acetate-Magnesium Carb 450-200 MG TABS Take by mouth.    . loratadine (CLARITIN) 10 MG tablet Take 10 mg by mouth daily as needed for allergies.    . Multiple Vitamins-Minerals (MULTIVITAMIN WITH MINERALS) tablet Take 1 tablet by mouth daily.    . Probiotic Product (PROBIOTIC DAILY PO) Take 1 tablet by mouth.    . cyclobenzaprine (FLEXERIL) 10 MG tablet Take 0.5-1 tablets (5-10 mg total) by mouth 3 (three) times daily as needed for muscle spasms. (Patient not taking: Reported on 01/27/2019) 30 tablet 1  . fluticasone (FLONASE) 50 MCG/ACT nasal spray Place 2  sprays into both nostrils daily. (Patient not taking: Reported on 01/27/2019) 16 g 2   No current facility-administered medications for this encounter.     Physical Findings: The patient is in no acute distress. Patient is alert and oriented.  height is 5\' 4"  (1.626 m) and weight is 147 lb 3.2 oz (66.8 kg). Her temporal temperature is 98.7 F (37.1 C). Her blood pressure is 107/75 and her pulse is 63. Her respiration is 16 and oxygen saturation is 96%. .  No significant changes. Lungs are clear to auscultation bilaterally. Heart has regular rate and rhythm. No palpable cervical, supraclavicular, or axillary adenopathy. Abdomen soft, non-tender, normal bowel sounds. The left chest wall area shows a mastectomy scar which is healed well.  The right chest wall area shows a mastectomy scar with some mild radiation changes.  The patient skin is healed well.  No palpable or visible signs of recurrence.  Patient has some mild edema in the axillary region.  I do not appreciate any significant edema in the neck area.  The submandibular glands are soft, mildly enlarged.  Lab Findings: Lab Results  Component Value Date   WBC 3.1 (L) 01/22/2019   HGB 12.1 01/22/2019   HCT 36.3 01/22/2019   MCV 100.8 (H) 01/22/2019   PLT 193 01/22/2019    Radiographic Findings: No results found.  Impression:  The patient is recovering from the effects of radiation.  No evidence of recurrence on clinical exam today.  Patient would like to pursue  physical therapy for the right axillary edema.  Will place order for this evaluation today.  Plan: Routine follow-up in radiation oncology in March 2021.  Patient will follow-up with medical oncology in December.  Physical therapy evaluation as above.  ____________________________________ Gery Pray, MD   This document serves as a record of services personally performed by Gery Pray, MD. It was created on his behalf by Wilburn Mylar, a trained medical scribe. The  creation of this record is based on the scribe's personal observations and the provider's statements to them. This document has been checked and approved by the attending provider.

## 2019-03-17 NOTE — Patient Instructions (Signed)
Coronavirus (COVID-19) Are you at risk?  Are you at risk for the Coronavirus (COVID-19)?  To be considered HIGH RISK for Coronavirus (COVID-19), you have to meet the following criteria:  . Traveled to [place], [place], [place], [place] or [place]; or in the [place] to [place], [place], [place], or [place]; and have fever, cough, and shortness of breath within the last 2 weeks of travel OR . Been in close contact with a person diagnosed with COVID-19 within the last 2 weeks and have fever, cough, and shortness of breath . IF YOU DO NOT MEET THESE CRITERIA, YOU ARE CONSIDERED LOW RISK FOR COVID-19.  What to do if you are HIGH RISK for COVID-19?  . If you are having a medical emergency, call 911. . Seek medical care right away. Before you go to a doctor's office, urgent care or emergency department, call ahead and tell them about your recent travel, contact with someone diagnosed with COVID-19, and your symptoms. You should receive instructions from your physician's office regarding next steps of care.  . When you arrive at healthcare provider, tell the healthcare staff immediately you have returned from visiting [place], [place], [place], [place] or [place]; or traveled in the [place] to [place], [place], [place], or [place]; in the last two weeks or you have been in close contact with a person diagnosed with COVID-19 in the last 2 weeks.   . Tell the health care staff about your symptoms: fever, cough and shortness of breath. . After you have been seen by a medical provider, you will be either: o Tested for (COVID-19) and discharged home on quarantine except to seek medical care if symptoms worsen, and asked to  - Stay home and avoid contact with others until you get your results (4-5 days)  - Avoid travel on public transportation if possible (such as bus, train, or airplane) or o Sent to the Emergency Department by EMS for evaluation, COVID-19 testing, and possible  admission depending on your condition and test results.  What to do if you are LOW RISK for COVID-19?  Reduce your risk of any infection by using the same precautions used for avoiding the common cold or flu:  . Wash your hands often with soap and warm water for at least 20 seconds.  If soap and water are not readily available, use an alcohol-based hand sanitizer with at least 60% alcohol.  . If coughing or sneezing, cover your mouth and nose by coughing or sneezing into the elbow areas of your shirt or coat, into a tissue or into your sleeve (not your hands). . Avoid shaking hands with others and consider head nods or verbal greetings only. . Avoid touching your eyes, nose, or mouth with unwashed hands.  . Avoid close contact with people who are sick. . Avoid places or events with large numbers of people in one location, like concerts or sporting events. . Carefully consider travel plans you have or are making. . If you are planning any travel outside or inside the [place], visit the CDC's Travelers' Health webpage for the latest health notices. . If you have some symptoms but not all symptoms, continue to monitor at home and seek medical attention if your symptoms worsen. . If you are having a medical emergency, call 911.   ADDITIONAL HEALTHCARE OPTIONS FOR PATIENTS  [facility] / e-Visit: [email or web address]         [facility]: [phone]  [facility]: [phone]                   [facility]: [phone]   

## 2019-03-20 ENCOUNTER — Encounter: Payer: Self-pay | Admitting: Physical Therapy

## 2019-03-20 ENCOUNTER — Other Ambulatory Visit: Payer: Self-pay

## 2019-03-20 ENCOUNTER — Ambulatory Visit: Payer: BC Managed Care – PPO | Attending: Hematology and Oncology | Admitting: Physical Therapy

## 2019-03-20 DIAGNOSIS — M25612 Stiffness of left shoulder, not elsewhere classified: Secondary | ICD-10-CM | POA: Diagnosis not present

## 2019-03-20 DIAGNOSIS — M25552 Pain in left hip: Secondary | ICD-10-CM | POA: Insufficient documentation

## 2019-03-20 DIAGNOSIS — M25511 Pain in right shoulder: Secondary | ICD-10-CM | POA: Diagnosis not present

## 2019-03-20 DIAGNOSIS — R293 Abnormal posture: Secondary | ICD-10-CM | POA: Insufficient documentation

## 2019-03-20 DIAGNOSIS — M6281 Muscle weakness (generalized): Secondary | ICD-10-CM

## 2019-03-20 DIAGNOSIS — Z483 Aftercare following surgery for neoplasm: Secondary | ICD-10-CM | POA: Diagnosis not present

## 2019-03-20 DIAGNOSIS — M25611 Stiffness of right shoulder, not elsewhere classified: Secondary | ICD-10-CM

## 2019-03-20 NOTE — Therapy (Signed)
[facility] [address] [place], [place], [address] Phone: [phone]   Fax:  [phone]  Physical Therapy Treatment  Patient Details  Name: Darlene Grant MRN: [id number] Date of Birth: 1975/10/07 Referring Provider (PT): Dr. Nicholas Lose; Dr. Clovia Cuff   Encounter Date: 03/20/2019  PT End of Session - 03/20/19 1005    Visit Number  19    Date for PT Re-Evaluation  04/28/19    Authorization Type  [employer]    PT Start Time  0845    PT Stop Time  1004    PT Time Calculation (min)  79 min    Activity Tolerance  Patient tolerated treatment well;No increased pain    Behavior During Therapy  WFL for tasks assessed/performed       Past Medical History:  Diagnosis Date  . Abnormal Pap smear    ASCUS 03/13/11  . Anemia   . Breast pain, left   . Cancer ([place])   . Candida vaginitis   . Complication of anesthesia    pt states that all narcotics make her angry and she would prefer to avoid them.  . Rectal itching   . Vaginal Pap smear, abnormal     Past Surgical History:  Procedure Laterality Date  . BREAST BIOPSY Right 8/16  . CESAREAN SECTION N/A 11/21/2013   Procedure: CESAREAN SECTION;  Surgeon: Alwyn Pea, MD;  Location: [facility];  Service: Obstetrics;  Laterality: N/A;  . CESAREAN SECTION N/A 06/23/2016   Procedure: CESAREAN SECTION;  Surgeon: Emily Filbert, MD;  Location: [facility];  Service: Obstetrics;  Laterality: N/A;  . HAND SURGERY  04/2012   ligament repair  . MASTECTOMY WITH AXILLARY LYMPH NODE DISSECTION Bilateral 11/14/2018   Procedure: RIGHT MASTECTOMY WITH RIGHT AXILLARY LYMPH NODE DISSECTION AND LEFT RISK REDUCING MASTECTOMY;  Surgeon: Rolm Bookbinder, MD;  Location: [facility];  Service: General;  Laterality: Bilateral;  LATE ENTRY: Corrected laterality documentation to read right axillary lymph node dissection vs. documented laterality of left.    [facility] PILONIDAL  CYST EXCISION  2000  . PORTACATH PLACEMENT N/A 05/28/2018   Procedure: INSERTION PORT-A-CATH WITH ULTRASOUND;  Surgeon: Rolm Bookbinder, MD;  Location: [facility];  Service: General;  Laterality: N/A;  . WISDOM TOOTH EXTRACTION  AGE 43    There were no vitals filed for this visit.  Subjective Assessment - 03/20/19 0855    Subjective  I felt tired after last visit. It really helped my left shoulder. I have had a good week. I saw Dr. Sondra Come this week and said the swelling in the right axilla area is still swollen and I will go back to cancer center for lymphedema.    Pertinent History  breast cancer diagnosed November 2019: triple negative with Ki of 75% She completed chemotherapy 10/18/2018. 11/14/2018: she underwent bilateral mastectomy with 0/13 nodes positive on the right; radiation ended on 02/10/2019    Patient Stated Goals  sleep without pain, exercise without discomfort, carry daughter on left due to radiation on the right breast    Currently in Pain?  Yes    Pain Score  3     Pain Location  Hip    Pain Orientation  Left    Pain Descriptors / Indicators  Sore    Pain Type  Chronic pain    Pain Onset  More than a month ago    Pain Frequency  Constant    Aggravating Factors  step on left foot and extend the left hip    Pain Relieving Factors  stretching, walking, ice    Multiple Pain Sites  Yes    Pain Score  3    Pain Location  Shoulder    Pain Orientation  Left    Pain Descriptors / Indicators  Aching    Pain Type  Acute pain    Pain Onset  More than a month ago    Pain Frequency  Constant    Aggravating Factors   raising left arm and picking up daughter    Pain Relieving Factors  when I feel it pop                       [facility] Adult PT Treatment/Exercise - 03/20/19 0001      Knee/Hip Exercises: Aerobic   Stationary Bike  8 minutes level 2 while assessing patient      Knee/Hip Exercises: Standing   Forward Lunges  Right;Left;1 set;10 reps     Forward Lunges Limitations  wth bil. shoulder flexion; then single leg stance bringing opposite leg back with shoulder going forward,     Hip ADduction Limitations  walking sideways with red band around the knees    Wall Squat  1 set;10 reps;5 seconds    Wall Squat Limitations  red banc around the knees    SLS  stand on one leg and bend forward and come back up 8 times per leg    Gait Training  plie with shoudler abduction,     Other Standing Knee Exercises  wall push ups with core engaged    Other Standing Knee Exercises  dead lift with scapula squeeze and working hamstring; lean forward with rowing 10x wiht 3# in each hand;      Shoulder Exercises: Standing   Other Standing Exercises  stand with left arm on wall for side plank and rotate trunk then do on right      Manual Therapy   Manual Therapy  Joint mobilization;Soft tissue mobilization;Myofascial release    Joint Mobilization  gapping of lumbar vertebrae in sidely and thoracic lumbar junction, side glide of lumbar in sideglide    Soft tissue mobilization  bil. upper trap, interscapular area, left RTC muscles, left quadratus, and left gluteal and piriformis    Myofascial Release  tissue rolling of the interscapular       Trigger Point Dry Needling - 03/20/19 0001    Consent Given?  Yes    Education Handout Provided  Previously provided    Muscles Treated Head and Neck  Upper trapezius   bil   Muscles Treated Upper Quadrant  Rhomboids;Subscapularis;Infraspinatus    Muscles Treated Back/Hip  Quadratus lumborum    Upper Trapezius Response  Twitch reponse elicited;Palpable increased muscle length   bil   Rhomboids Response  Twitch response elicited;Palpable increased muscle length   bil   Infraspinatus Response  Twitch response elicited;Palpable increased muscle length   left   Subscapularis Response  Twitch response elicited;Palpable increased muscle length   bil.    Quadratus Lumborum Response  Twitch response  elicited;Palpable increased muscle length   left            PT Short Term Goals - 02/20/19 0935      PT SHORT TERM GOAL #1   Title  independent with initial HEP for left hip    Time  4    Period  Weeks    Status  Achieved    Target Date  03/03/19      PT SHORT TERM GOAL #2   Title  understand how to take care of vaginal health to reduce dryness and keep tissue mobility    Time  4    Period  Weeks    Status  Achieved    Target Date  03/03/19        PT Long Term Goals - 03/13/19 1014      PT LONG TERM GOAL #1   Title  Pt will have 150 degrees of painfree right shoulder abduction so that she can perform her household chores easier    Baseline  108 with pain; 164 degrees without pain-01/15/19    Time  4    Period  Weeks    Status  Achieved      PT LONG TERM GOAL #2   Title  Pt will report 50% decrease in right shoulder tightness and pulling from axillary cording    Baseline  75% reported at this time-01/15/19    Time  4    Period  Weeks    Status  Achieved      PT LONG TERM GOAL #3   Title  Pt will be independent in a home exercise program for shoulder ROM and strength    Baseline  still learning    Time  4    Period  Weeks    Status  On-going      PT LONG TERM GOAL #4   Title  Pt to report less tightness with OH reaching by at least 50% at Lt axilla.    Baseline  still has issues    Status  On-going      PT LONG TERM GOAL #5   Title  Patient able to ambulate without left hip pain due to pelvis in correct alignment    Baseline  hip pain improved by 75%    Time  12    Period  Weeks    Status  On-going      PT LONG TERM GOAL #6   Title  able to lay on left hip for sleeping with minimal to no pain    Baseline  75% better    Time  12    Period  Weeks    Status  On-going      PT LONG TERM GOAL #7   Title  able to hold her child on the left hip due to the radiation soreness on the right breast with pain minimal to none    Baseline  50% better    Time  12     Period  Weeks    Status  On-going            Plan - 03/20/19 1006    Clinical Impression Statement  Patient is building up her endurance. Left shoulder and hip pain is 3/10. Pateint has full left shoulder flexion and abduction. Patient has less tightness in the left shoulder girdle. Patient is doing exercises that incorporate hip, core and shoulder strength together. Patient is starting weightbearing exercises in the upper extremity leaning on the wall. Patient will be going to the cancer center for lymphedema treatment. Patient will be on vacation next week. Patient will benefit from skilled therapy to improve reight shoulder mobility, reduce right hip pain and improve overall strength.    Personal Factors and Comorbidities  Comorbidity 1;Sex    Comorbidities  recent chemo    Examination-Activity Limitations  Bathing;Reach  Overhead;Caring for Others;Lift;Hygiene/Grooming;Locomotion Level;Sleep    Stability/Clinical Decision Making  Evolving/Moderate complexity    Rehab Potential  Good    PT Frequency  1x / week    PT Duration  12 weeks    PT Treatment/Interventions  ADLs/Self Care Home Management;Therapeutic exercise;Manual lymph drainage;Manual techniques;Biofeedback;Electrical Stimulation;Therapeutic activities;Neuromuscular re-education;Patient/family education;Dry needling;Passive range of motion;Spinal Manipulations    PT Next Visit Plan  work on shoulder ROM, hip and core strength; dry needling    PT Home Exercise Plan  Access Code: [email or web address]    Consulted and Agree with Plan of Care  Patient       Patient will benefit from skilled therapeutic intervention in order to improve the following deficits and impairments:  Decreased knowledge of precautions, Decreased knowledge of use of DME, Postural dysfunction, Decreased range of motion, Increased fascial restricitons, Impaired UE functional use, Increased muscle spasms, Impaired perceived functional ability, Pain, Decreased scar  mobility, Decreased strength, Increased edema, Difficulty walking, Decreased activity tolerance, Decreased mobility  Visit Diagnosis: Pain in left hip  Stiffness of right shoulder joint  Acute pain of right shoulder  Aftercare following surgery for neoplasm  Abnormal posture  Muscle weakness (generalized)  Stiffness of left shoulder, not elsewhere classified     Problem List Patient Active Problem List   Diagnosis Date Noted  . History of right breast cancer 11/14/2018  . Chemotherapy induced neutropenia ([place]) 09/13/2018  . Port-A-Cath in place 06/10/2018  . Malignant neoplasm of upper-outer quadrant of right breast in female, estrogen receptor negative ([place]) 05/09/2018  . Low iron stores 01/16/2018  . Muscle spasm 01/15/2018  . Piriformis syndrome of left side 01/15/2018  . Grade I hemorrhoids 02/23/2017  . Low lying posterior placenta, antepartum 02/01/2016  . AMA (advanced maternal age) multigravida 35+ 11/22/2015  . 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 Calcification of right breast 02/19/2015  . Ligament tear 07/05/2012    Earlie Counts, PT 03/20/19 10:12 AM   [facility] [address] [place], [place], [address] Phone: [phone]   Fax:  [phone]  Name: Darlene Grant MRN: [id number] Date of Birth: [birth year]

## 2019-03-27 ENCOUNTER — Encounter: Payer: BC Managed Care – PPO | Admitting: Physical Therapy

## 2019-03-31 ENCOUNTER — Other Ambulatory Visit: Payer: Self-pay

## 2019-03-31 ENCOUNTER — Ambulatory Visit: Payer: BC Managed Care – PPO | Attending: General Surgery | Admitting: Physical Therapy

## 2019-03-31 DIAGNOSIS — M25511 Pain in right shoulder: Secondary | ICD-10-CM | POA: Diagnosis not present

## 2019-03-31 DIAGNOSIS — Z483 Aftercare following surgery for neoplasm: Secondary | ICD-10-CM | POA: Diagnosis not present

## 2019-03-31 DIAGNOSIS — M25552 Pain in left hip: Secondary | ICD-10-CM | POA: Diagnosis not present

## 2019-03-31 DIAGNOSIS — L599 Disorder of the skin and subcutaneous tissue related to radiation, unspecified: Secondary | ICD-10-CM | POA: Diagnosis not present

## 2019-03-31 DIAGNOSIS — M6281 Muscle weakness (generalized): Secondary | ICD-10-CM | POA: Insufficient documentation

## 2019-03-31 DIAGNOSIS — R293 Abnormal posture: Secondary | ICD-10-CM | POA: Diagnosis not present

## 2019-03-31 DIAGNOSIS — M25612 Stiffness of left shoulder, not elsewhere classified: Secondary | ICD-10-CM

## 2019-03-31 DIAGNOSIS — M25611 Stiffness of right shoulder, not elsewhere classified: Secondary | ICD-10-CM | POA: Diagnosis not present

## 2019-03-31 NOTE — Therapy (Signed)
[place], [place], [address] Phone: [phone]   Fax:  [phone]  Physical Therapy Treatment    Progress Note Reporting Period 11/07/2018  to 03/31/2019   See note below for Objective Data and Assessment of Progress/Goals.        Patient Details  Name: Darlene Grant MRN: [id number] Date of Birth: 07/14/1975 Referring Provider (PT): Dr. Nicholas Lose; Dr. Clovia Cuff   Encounter Date: 03/31/2019  PT End of Session - 03/31/19 1233    Visit Number  20    Number of Visits  29    Date for PT Re-Evaluation  04/28/19   04/28/2019 for ortho, 05/01/2019 for cancer rehab   Authorization Type  BC[employer]    PT Start Time  1000    PT Stop Time  1045    PT Time Calculation (min)  45 min    Activity Tolerance  Patient tolerated treatment well;No increased pain    Behavior During Therapy  WFL for tasks assessed/performed       Past Medical History:  Diagnosis Date  . Abnormal Pap smear    ASCUS 03/13/11  . Anemia   . Breast pain, left   . Cancer ([place])   . Candida vaginitis   . Complication of anesthesia    pt states that all narcotics make her angry and she would prefer to avoid them.  . Rectal itching   . Vaginal Pap smear, abnormal     Past Surgical History:  Procedure Laterality Date  . BREAST BIOPSY Right 8/16  . CESAREAN SECTION N/A 11/21/2013   Procedure: CESAREAN SECTION;  Surgeon: Alwyn Pea, MD;  Location: [facility];  Service: Obstetrics;  Laterality: N/A;  . CESAREAN SECTION N/A 06/23/2016   Procedure: CESAREAN SECTION;  Surgeon: Emily Filbert, MD;  Location: [place];  Service: Obstetrics;  Laterality: N/A;  . HAND SURGERY  04/2012   ligament repair  . MASTECTOMY WITH AXILLARY LYMPH NODE DISSECTION Bilateral 11/14/2018   Procedure: RIGHT MASTECTOMY WITH RIGHT AXILLARY LYMPH NODE DISSECTION AND LEFT RISK REDUCING MASTECTOMY;  Surgeon: Rolm Bookbinder, MD;  Location:  [place];  Service: General;  Laterality: Bilateral;  LATE ENTRY: Corrected laterality documentation to read right axillary lymph node dissection vs. documented laterality of left.    Marland Kitchen PILONIDAL CYST EXCISION  2000  . PORTACATH PLACEMENT N/A 05/28/2018   Procedure: INSERTION PORT-A-CATH WITH ULTRASOUND;  Surgeon: Rolm Bookbinder, MD;  Location: [place];  Service: General;  Laterality: N/A;  . WISDOM TOOTH EXTRACTION  AGE 62    There were no vitals filed for this visit.  Subjective Assessment - 03/31/19 1218    Subjective  Pt reports she is getting much relief from treatamen from [facility] Salts , but wants to focus today on the swelling has developed in her right axilla and upper chest and back after radiation    Pertinent History  breast cancer diagnosed November 2019: triple negative with Ki of 75% She completed chemotherapy 10/18/2018. 11/14/2018: she underwent bilateral mastectomy with 0/13 nodes positive on the right; radiation ended on 02/10/2019    Patient Stated Goals  sleep without pain, exercise without discomfort, carry daughter on left due to radiation on the right breast    Currently in Pain?  Yes    Pain Score  --   did not rate, but gingerly touches right axilla   Pain Location  Axilla    Pain Orientation  Right  Pain Descriptors / Indicators  Sore    Pain Type  Chronic pain    Pain Radiating Towards  across right chest and upper lateral trunk at radiation field    Pain Onset  More than a month ago    Pain Frequency  Intermittent    Aggravating Factors   using her right arm    Pain Relieving Factors  resting         [facility] PT Assessment - 03/31/19 0001      Observation/Other Assessments   Skin Integrity  Pt had darkened skin in right axilla and radiation field with some light areas at skin folds. She appears to have fullness in the is area with dimpling around a full area near pec muscle.  The skin appears to be fragile, but healing.   Her mastectomy scars are mobile .         LYMPHEDEMA/ONCOLOGY QUESTIONNAIRE - 03/31/19 1227      Type   Cancer Type  breast       Surgeries   Mastectomy Date  11/14/18    Number Lymph Nodes Removed  13      Treatment   Past Chemotherapy Treatment  Yes    Past Radiation Treatment  Yes    Date  --   completed 02/12/2019   Body Site  right upper quadrant       What other symptoms do you have   Are you Having Heaviness or Tightness  Yes    Are you having Pain  Yes    Are you having pitting edema  No    Is it Hard or Difficult finding clothes that fit  No    Do you have infections  No    Is there Decreased scar mobility  No    Stemmer Sign  No      Right Upper Extremity Lymphedema   10 cm Proximal to Olecranon Process  27.5 cm    Olecranon Process  23.5 cm    15 cm Proximal to Ulnar Styloid Process  23.8 cm    Just Proximal to Ulnar Styloid Process  16.5 cm    Across Hand at [employer]  18.8 cm    At [place] of 2nd Digit  6 cm    Other  unable to measure upper chest and axilla       Left Upper Extremity Lymphedema   10 cm Proximal to Olecranon Process  27.5 cm    Olecranon Process  23.5 cm    15 cm Proximal to Ulnar Styloid Process  23 cm    Just Proximal to Ulnar Styloid Process  15.5 cm    Across Hand at [employer]  19 cm    At [place] of 2nd Digit  5.9 cm                [facility] Adult PT Treatment/Exercise - 03/31/19 0001      Manual Therapy   Manual Therapy  Manual Lymphatic Drainage (MLD)    Edema Management  gave pt a chip pack to wear inside tank top at axilla and showed pt the information about swell spot from compression guru     Manual Lymphatic Drainage (MLD)  briefly, in supine and sidelying while explaining anatomy and physiology of lymph system: diaphragmaitic breaths, right inguinal nodes, right axillo-inguinal anatamosis, left axillay nodes, anterior interaxillay anastamoisis, right chest and lateral trunk and posterior interaxillay anastamosis  being careful not to overstretch radiated skin  PT Short Term Goals - 03/31/19 1246      PT SHORT TERM GOAL #1   Title  independent with initial HEP for left hip    Time  4    Period  Weeks    Status  Achieved      PT SHORT TERM GOAL #2   Title  understand how to take care of vaginal health to reduce dryness and keep tissue mobility    Period  Weeks    Status  Achieved        PT Long Term Goals - 03/31/19 1246      PT LONG TERM GOAL #1   Title  Pt will have 150 degrees of painfree right shoulder abduction so that she can perform her household chores easier    Baseline  108 with pain; 164 degrees without pain-01/15/19    Period  Weeks    Status  Achieved      PT LONG TERM GOAL #2   Title  Pt will report 50% decrease in right shoulder tightness and pulling from axillary cording    Baseline  75% reported at this time-01/15/19    Time  4    Period  Weeks    Status  Achieved      PT LONG TERM GOAL #3   Title  Pt will be independent in a home exercise program for shoulder ROM and strength    Baseline  still learning    Period  Weeks    Status  On-going      PT LONG TERM GOAL #4   Title  Pt to report less tightness with OH reaching by at least 50% at Lt axilla.    Baseline  still has issues    Time  4    Period  Weeks    Status  On-going      PT LONG TERM GOAL #5   Title  Patient able to ambulate without left hip pain due to pelvis in correct alignment    Baseline  hip pain improved by 75%    Time  12    Period  Weeks    Status  On-going      Additional Long Term Goals   Additional Long Term Goals  Yes      PT LONG TERM GOAL #6   Title  able to lay on left hip for sleeping with minimal to no pain    Baseline  75% better    Period  Weeks    Status  On-going      PT LONG TERM GOAL #7   Title  able to hold her child on the left hip due to the radiation soreness on the right breast with pain minimal to none    Baseline  50% better    Time   12    Period  Weeks    Status  On-going      PT LONG TERM GOAL #8   Title  Pt will report that the discomfort in her right axilla and radiation area is decreased by 50% and that she will be able to manage it at home    Time  4    Period  Weeks    Status  New            Plan - 03/31/19 1235    Clinical Impression Statement  Pt returns to cancer rehab for assessment and treatments of the fullness she has in her right upper quadrant as  she heals from radiation therapy.  Her skin in her axilla appears to be fragile and still healing and she does have visible fullness in her right chest, upper lateral and posterior chest and back.  She received manual lymph drainage today and a temporary chip pack to wear inside her tank top if tolerated. She will continue to receive her ortho PTfor hip and shoulder pain  at [facility] location and come her for manual lymph drainage and to learn how to manage the swelling at home.  Both arms measure the same so she does not appear to have exremity lymphedema at this time.    Personal Factors and Comorbidities  Comorbidity 1;Sex    Comorbidities  recent chemo and radiation    Examination-Activity Limitations  Bathing;Reach Overhead;Caring for Others;Lift;Hygiene/Grooming;Locomotion Level;Sleep    Rehab Potential  Good    PT Frequency  1x / week    PT Treatment/Interventions  ADLs/Self Care Home Management;Therapeutic exercise;Manual techniques;Biofeedback;Electrical Stimulation;Therapeutic activities;Neuromuscular re-education;Patient/family education;Dry needling;Passive range of motion;Spinal Manipulations;Manual lymph drainage;Taping;Compression bandaging;Orthotic Fit/Training    PT Next Visit Plan  work on shoulder ROM, hip and core strength; dry needling, for ortho, manual lymph drainage and check on chip packs for cancer rehab    Consulted and Agree with Plan of Care  Patient       Patient will benefit from skilled therapeutic intervention in order to  improve the following deficits and impairments:  Decreased knowledge of precautions, Decreased knowledge of use of DME, Postural dysfunction, Decreased range of motion, Increased fascial restricitons, Impaired UE functional use, Increased muscle spasms, Impaired perceived functional ability, Pain, Decreased scar mobility, Decreased strength, Increased edema, Difficulty walking, Decreased activity tolerance, Decreased mobility  Visit Diagnosis: Pain in left hip - Plan: PT plan of care cert/re-cert  Stiffness of right shoulder joint - Plan: PT plan of care cert/re-cert  Acute pain of right shoulder - Plan: PT plan of care cert/re-cert  Aftercare following surgery for neoplasm - Plan: PT plan of care cert/re-cert  Abnormal posture - Plan: PT plan of care cert/re-cert  Muscle weakness (generalized) - Plan: PT plan of care cert/re-cert  Stiffness of left shoulder, not elsewhere classified - Plan: PT plan of care cert/re-cert  Disorder of the skin and subcutaneous tissue related to radiation, unspecified - Plan: PT plan of care cert/re-cert     Problem List Patient Active Problem List   Diagnosis Date Noted  . History of right breast cancer 11/14/2018  . Chemotherapy induced neutropenia ([place]) 09/13/2018  . Port-A-Cath in place 06/10/2018  . Malignant neoplasm of upper-outer quadrant of right breast in female, estrogen receptor negative ([place]) 05/09/2018  . Low iron stores 01/16/2018  . Muscle spasm 01/15/2018  . Piriformis syndrome of left side 01/15/2018  . Grade I hemorrhoids 02/23/2017  . Low lying posterior placenta, antepartum 02/01/2016  . AMA (advanced maternal age) multigravida 35+ 11/22/2015  . 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 Calcification of right breast 02/19/2015  . Ligament tear 07/05/2012   Donato Heinz. Owens Shark PT  Norwood Levo 03/31/2019, 12:52 PM  [place], [place],  [address] Phone: [phone]   Fax:  [phone]  Name: NIANG DOMM MRN: [id number] Date of Birth: [birth year]

## 2019-04-03 ENCOUNTER — Other Ambulatory Visit: Payer: Self-pay

## 2019-04-03 ENCOUNTER — Ambulatory Visit: Payer: BC Managed Care – PPO | Admitting: Physical Therapy

## 2019-04-03 ENCOUNTER — Encounter: Payer: Self-pay | Admitting: Physical Therapy

## 2019-04-03 DIAGNOSIS — M6281 Muscle weakness (generalized): Secondary | ICD-10-CM

## 2019-04-03 DIAGNOSIS — M25552 Pain in left hip: Secondary | ICD-10-CM | POA: Diagnosis not present

## 2019-04-03 DIAGNOSIS — M25612 Stiffness of left shoulder, not elsewhere classified: Secondary | ICD-10-CM

## 2019-04-03 DIAGNOSIS — M25511 Pain in right shoulder: Secondary | ICD-10-CM | POA: Diagnosis not present

## 2019-04-03 DIAGNOSIS — Z483 Aftercare following surgery for neoplasm: Secondary | ICD-10-CM

## 2019-04-03 DIAGNOSIS — R293 Abnormal posture: Secondary | ICD-10-CM | POA: Diagnosis not present

## 2019-04-03 DIAGNOSIS — M25611 Stiffness of right shoulder, not elsewhere classified: Secondary | ICD-10-CM | POA: Diagnosis not present

## 2019-04-03 NOTE — Therapy (Signed)
[facility] [address], [place], [place], [address] Phone: [phone]   Fax:  [phone]  Physical Therapy Treatment  Patient Details  Name: Darlene Grant MRN: [id number] Date of Birth: 1976/04/02 Referring Provider (PT): Dr. Nicholas Lose; Dr. Clovia Cuff   Encounter Date: 04/03/2019  PT End of Session - 04/03/19 0901    Visit Number  22    Date for PT Re-Evaluation  04/28/19   04/28/19 for orth, 05/01/19 for breast   Authorization Type  BCBS    PT Start Time  0845    PT Stop Time  1008    PT Time Calculation (min)  83 min    Activity Tolerance  Patient tolerated treatment well;No increased pain    Behavior During Therapy  WFL for tasks assessed/performed       Past Medical History:  Diagnosis Date  . Abnormal Pap smear    ASCUS 03/13/11  . Anemia   . Breast pain, left   . Cancer ([place])   . Candida vaginitis   . Complication of anesthesia    pt states that all narcotics make her angry and she would prefer to avoid them.  . Rectal itching   . Vaginal Pap smear, abnormal     Past Surgical History:  Procedure Laterality Date  . BREAST BIOPSY Right 8/16  . CESAREAN SECTION N/A 11/21/2013   Procedure: CESAREAN SECTION;  Surgeon: Alwyn Pea, MD;  Location: [facility];  Service: Obstetrics;  Laterality: N/A;  . CESAREAN SECTION N/A 06/23/2016   Procedure: CESAREAN SECTION;  Surgeon: Emily Filbert, MD;  Location: [place];  Service: Obstetrics;  Laterality: N/A;  . HAND SURGERY  04/2012   ligament repair  . MASTECTOMY WITH AXILLARY LYMPH NODE DISSECTION Bilateral 11/14/2018   Procedure: RIGHT MASTECTOMY WITH RIGHT AXILLARY LYMPH NODE DISSECTION AND LEFT RISK REDUCING MASTECTOMY;  Surgeon: Rolm Bookbinder, MD;  Location: [facility];  Service: General;  Laterality: Bilateral;  LATE ENTRY: Corrected laterality documentation to read right axillary lymph node dissection vs.  documented laterality of left.    Marland Kitchen PILONIDAL CYST EXCISION  2000  . PORTACATH PLACEMENT N/A 05/28/2018   Procedure: INSERTION PORT-A-CATH WITH ULTRASOUND;  Surgeon: Rolm Bookbinder, MD;  Location: [place] [facility];  Service: General;  Laterality: N/A;  . WISDOM TOOTH EXTRACTION  AGE 43    There were no vitals filed for this visit.  Subjective Assessment - 04/03/19 0854    Subjective  left shoulder is 85% better. I have trouble reaching behind me. Left hip and low back hurt more last week and I feel it is the quadratus. My skin got irritated with the chip pack. Patient ordered swell spot.    Pertinent History  breast cancer diagnosed November 2019: triple negative with Ki of 75% She completed chemotherapy 10/18/2018. 11/14/2018: she underwent bilateral mastectomy with 0/13 nodes positive on the right; radiation ended on 02/10/2019    Patient Stated Goals  sleep without pain, exercise without discomfort, carry daughter on left due to radiation on the right breast    Currently in Pain?  Yes    Pain Score  2     Pain Location  Shoulder    Pain Orientation  Left    Pain Descriptors / Indicators  Aching;Sore;Tightness    Pain Type  Chronic pain    Pain Onset  More than a month ago    Pain Frequency  Intermittent    Aggravating Factors  reaching back and external rotation of the of the left shoulder, overhead movements    Pain Relieving Factors  arm at side    Multiple Pain Sites  Yes    Pain Score  2    Pain Location  Shoulder    Pain Orientation  Left    Pain Descriptors / Indicators  Sore;Aching;Tightness    Pain Type  Chronic pain    Pain Onset  More than a month ago    Pain Frequency  Intermittent    Aggravating Factors   reach behind her, overhead movements    Pain Relieving Factors  when I feel it pop         [facility] PT Assessment - 04/03/19 0001      AROM   Left Shoulder Extension  65 Degrees    Left Shoulder Flexion  170 Degrees    Left Shoulder ABduction  176  Degrees    Left Shoulder Internal Rotation  --   reach to T6     Strength   Left Hip Flexion  4/5    Left Hip Extension  4/5    Left Hip Internal Rotation  5/5    Left Hip ABduction  4+/5      Palpation   SI assessment   pelvis in correct alignment                   [facility] Adult PT Treatment/Exercise - 04/03/19 0001      Lumbar Exercises: Standing   Other Standing Lumbar Exercises  tall knee holding the green plyball going from kneeling to tall kneel    Other Standing Lumbar Exercises  hold onto corner of wall and rotate trunk to stretch side; stand on towel and move hips side to side for rotation      Knee/Hip Exercises: Aerobic   Stationary Bike  8 minutes level 3 -4  while assessing patient      Knee/Hip Exercises: Standing   Gait Training  half knee with holding the green plyoball 10x each direction      Shoulder Exercises: Standing   Other Standing Exercises  stand with back against the wall and wrap arms together and lean to the side    Other Standing Exercises  stand with hands on high mat and move to the side to stretch lateral trunk then bring leg behind her  on the side leanning away from      Manual Therapy   Manual Therapy  Soft tissue mobilization;Myofascial release    Soft tissue mobilization  using the addaday on the lumbar paraspinals, left gluteal and pirifomris; soft tisue work to the left upper trap, interscapular, left RTC    Myofascial Release  tissue rolling of the interscapular       Trigger Point Dry Needling - 04/03/19 0001    Consent Given?  Yes    Education Handout Provided  Previously provided    Muscles Treated Head and Neck  Upper trapezius;Levator scapulae    Muscles Treated Upper Quadrant  Infraspinatus;Teres major;Subscapularis    Muscles Treated Back/Hip  Lumbar multifidi    Upper Trapezius Response  Twitch reponse elicited;Palpable increased muscle length   bil   Infraspinatus Response  Twitch response elicited;Palpable  increased muscle length   left   Subscapularis Response  Twitch response elicited;Palpable increased muscle length   bil.    Teres major Response  Twitch response elicited;Palpable increased muscle length    Lumbar multifidi Response  Twitch response elicited;Palpable increased  muscle length             PT Short Term Goals - 03/31/19 1246      PT SHORT TERM GOAL #1   Title  independent with initial HEP for left hip    Time  4    Period  Weeks    Status  Achieved      PT SHORT TERM GOAL #2   Title  understand how to take care of vaginal health to reduce dryness and keep tissue mobility    Period  Weeks    Status  Achieved        PT Long Term Goals - 04/03/19 1008      PT LONG TERM GOAL #1   Title  Pt will have 150 degrees of painfree right shoulder abduction so that she can perform her household chores easier    Time  4    Period  Weeks    Status  Achieved      PT LONG TERM GOAL #4   Title  Pt to report less tightness with OH reaching by at least 50% at Lt axilla.    Time  4    Period  Weeks    Status  Achieved      PT LONG TERM GOAL #5   Title  Patient able to ambulate without left hip pain due to pelvis in correct alignment    Baseline  hip pain improved by 75%, pelvis in correct alginment    Time  12    Period  Weeks    Status  On-going      PT LONG TERM GOAL #6   Title  able to lay on left hip for sleeping with minimal to no pain    Baseline  75% better    Time  12    Period  Weeks    Status  On-going      PT LONG TERM GOAL #7   Title  able to hold her child on the left hip due to the radiation soreness on the right breast with pain minimal to none    Baseline  50% better    Time  12    Period  Weeks    Status  On-going            Plan - 04/03/19 0902    Clinical Impression Statement  Patient has full left shoulder A/ROM after manual therapy. Patient has increased strength of left hip. Patient pelvis in correct alignment. Patient left  shoulder pain is 80% better. Patient was able to reach behind her back with left arm with greater ease after manual work. Patient will benefit from skilled therapy to improve strength and function.    Personal Factors and Comorbidities  Comorbidity 1;Sex    Comorbidities  recent chemo and radiation    Examination-Activity Limitations  Bathing;Reach Overhead;Caring for Others;Lift;Hygiene/Grooming;Locomotion Level;Sleep    Stability/Clinical Decision Making  Evolving/Moderate complexity    Rehab Potential  Good    PT Frequency  1x / week    PT Duration  12 weeks    PT Treatment/Interventions  ADLs/Self Care Home Management;Therapeutic exercise;Manual techniques;Biofeedback;Electrical Stimulation;Therapeutic activities;Neuromuscular re-education;Patient/family education;Dry needling;Passive range of motion;Spinal Manipulations;Manual lymph drainage;Taping;Compression bandaging;Orthotic Fit/Training    PT Next Visit Plan  work on shoulder ROM, hip and core strength; dry needling, for ortho, manual lymph drainage and check on chip packs for cancer rehab    PT Home Exercise Plan  Access Code: [email or web address]    Consulted  and Agree with Plan of Care  Patient       Patient will benefit from skilled therapeutic intervention in order to improve the following deficits and impairments:  Decreased knowledge of precautions, Decreased knowledge of use of DME, Postural dysfunction, Decreased range of motion, Increased fascial restricitons, Impaired UE functional use, Increased muscle spasms, Impaired perceived functional ability, Pain, Decreased scar mobility, Decreased strength, Increased edema, Difficulty walking, Decreased activity tolerance, Decreased mobility  Visit Diagnosis: Pain in left hip  Stiffness of right shoulder joint  Acute pain of right shoulder  Aftercare following surgery for neoplasm  Abnormal posture  Muscle weakness (generalized)  Stiffness of left shoulder, not elsewhere  classified     Problem List Patient Active Problem List   Diagnosis Date Noted  . History of right breast cancer 11/14/2018  . Chemotherapy induced neutropenia ([place]) 09/13/2018  . Port-A-Cath in place 06/10/2018  . Malignant neoplasm of upper-outer quadrant of right breast in female, estrogen receptor negative ([place]) 05/09/2018  . Low iron stores 01/16/2018  . Muscle spasm 01/15/2018  . Piriformis syndrome of left side 01/15/2018  . Grade I hemorrhoids 02/23/2017  . Low lying posterior placenta, antepartum 02/01/2016  . AMA (advanced maternal age) multigravida 35+ 11/22/2015  . 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 Calcification of right breast 02/19/2015  . Ligament tear 07/05/2012    Earlie Counts, PT 04/03/19 10:11 AM    [facility] [address] [place], [place], [address] Phone: [phone]   Fax:  [phone]  Name: JANNAE PERRIGO MRN: [id number] Date of Birth: December 20, 1975

## 2019-04-07 ENCOUNTER — Ambulatory Visit: Payer: BC Managed Care – PPO | Admitting: Physical Therapy

## 2019-04-07 ENCOUNTER — Encounter: Payer: Self-pay | Admitting: Physical Therapy

## 2019-04-07 ENCOUNTER — Other Ambulatory Visit: Payer: Self-pay

## 2019-04-07 DIAGNOSIS — R293 Abnormal posture: Secondary | ICD-10-CM | POA: Diagnosis not present

## 2019-04-07 DIAGNOSIS — L599 Disorder of the skin and subcutaneous tissue related to radiation, unspecified: Secondary | ICD-10-CM

## 2019-04-07 DIAGNOSIS — Z483 Aftercare following surgery for neoplasm: Secondary | ICD-10-CM | POA: Diagnosis not present

## 2019-04-07 DIAGNOSIS — M25511 Pain in right shoulder: Secondary | ICD-10-CM | POA: Diagnosis not present

## 2019-04-07 DIAGNOSIS — M25552 Pain in left hip: Secondary | ICD-10-CM | POA: Diagnosis not present

## 2019-04-07 DIAGNOSIS — M6281 Muscle weakness (generalized): Secondary | ICD-10-CM | POA: Diagnosis not present

## 2019-04-07 DIAGNOSIS — M25611 Stiffness of right shoulder, not elsewhere classified: Secondary | ICD-10-CM | POA: Diagnosis not present

## 2019-04-07 DIAGNOSIS — M25612 Stiffness of left shoulder, not elsewhere classified: Secondary | ICD-10-CM | POA: Diagnosis not present

## 2019-04-07 NOTE — Therapy (Signed)
[place], [place], [address] Phone: [phone]   Fax:  [phone]  Physical Therapy Treatment  Patient Details  Name: Darlene Grant MRN: [id number] Date of Birth: 1975/08/28 Referring Provider (PT): Dr. Nicholas Lose; Dr. Clovia Cuff   Encounter Date: 04/07/2019  PT End of Session - 04/07/19 1244    Visit Number  23    Number of Visits  29    Date for PT Re-Evaluation  04/28/19   11/9 for otho, 11/12 for lymph   PT Start Time  0800    PT Stop Time  0845    PT Time Calculation (min)  45 min    Activity Tolerance  Patient tolerated treatment well    Behavior During Therapy  [facility] for tasks assessed/performed       Past Medical History:  Diagnosis Date  . Abnormal Pap smear    ASCUS 03/13/11  . Anemia   . Breast pain, left   . Cancer ([place])   . Candida vaginitis   . Complication of anesthesia    pt states that all narcotics make her angry and she would prefer to avoid them.  . Rectal itching   . Vaginal Pap smear, abnormal     Past Surgical History:  Procedure Laterality Date  . BREAST BIOPSY Right 8/16  . CESAREAN SECTION N/A 11/21/2013   Procedure: CESAREAN SECTION;  Surgeon: Alwyn Pea, MD;  Location: [facility];  Service: Obstetrics;  Laterality: N/A;  . CESAREAN SECTION N/A 06/23/2016   Procedure: CESAREAN SECTION;  Surgeon: Emily Filbert, MD;  Location: [place];  Service: Obstetrics;  Laterality: N/A;  . HAND SURGERY  04/2012   ligament repair  . MASTECTOMY WITH AXILLARY LYMPH NODE DISSECTION Bilateral 11/14/2018   Procedure: RIGHT MASTECTOMY WITH RIGHT AXILLARY LYMPH NODE DISSECTION AND LEFT RISK REDUCING MASTECTOMY;  Surgeon: Rolm Bookbinder, MD;  Location: [place];  Service: General;  Laterality: Bilateral;  LATE ENTRY: Corrected laterality documentation to read right axillary lymph node dissection vs. documented laterality of left.    Marland Kitchen  PILONIDAL CYST EXCISION  2000  . PORTACATH PLACEMENT N/A 05/28/2018   Procedure: INSERTION PORT-A-CATH WITH ULTRASOUND;  Surgeon: Rolm Bookbinder, MD;  Location: [facility];  Service: General;  Laterality: N/A;  . WISDOM TOOTH EXTRACTION  AGE 43    There were no vitals filed for this visit.  Subjective Assessment - 04/07/19 1235    Subjective  Pt report she is having some fullness in her right shoulder and lateral chest    Pertinent History  breast cancer diagnosed November 2019: triple negative with Ki of 75% She completed chemotherapy 10/18/2018. 11/14/2018: she underwent bilateral mastectomy with 0/13 nodes positive on the right; radiation ended on 02/10/2019    Patient Stated Goals  sleep without pain, exercise without discomfort, carry daughter on left due to radiation on the right breast    Currently in Pain?  Yes    Pain Score  2     Pain Location  Chest    Pain Orientation  Right    Pain Descriptors / Indicators  --   fullness   Pain Onset  More than a month ago    Pain Frequency  Intermittent                       [facility] Adult PT Treatment/Exercise - 04/07/19 0001      Exercises   Exercises  Neck;Shoulder;Other Exercises    Other Exercises   deep breathing including  3 second "box breathing" with manual cues at ribs for better expansion       Shoulder Exercises: Stretch   Corner Stretch  3 reps    Corner Stretch Limitations  with right hand down for pec minor stretch       Manual Therapy   Joint Mobilization  stretch to at coracoid process for pec minor stretch     Myofascial Release  to right lateral chest     Manual Lymphatic Drainage (MLD)  in supine for short neck and adominal scooping, right inguinal nodes and right axillo-inguinal anastamosis, left axillary nodes and anterior interaxillary anastamosis and chest, then to left sidelying for  posterior interaxillay anastamosis being careful not to overstretch radiated skin                 PT Short Term Goals - 03/31/19 1246      PT SHORT TERM GOAL #1   Title  independent with initial HEP for left hip    Time  4    Period  Weeks    Status  Achieved      PT SHORT TERM GOAL #2   Title  understand how to take care of vaginal health to reduce dryness and keep tissue mobility    Period  Weeks    Status  Achieved        PT Long Term Goals - 04/03/19 1008      PT LONG TERM GOAL #1   Title  Pt will have 150 degrees of painfree right shoulder abduction so that she can perform her household chores easier    Time  4    Period  Weeks    Status  Achieved      PT LONG TERM GOAL #4   Title  Pt to report less tightness with OH reaching by at least 50% at Lt axilla.    Time  4    Period  Weeks    Status  Achieved      PT LONG TERM GOAL #5   Title  Patient able to ambulate without left hip pain due to pelvis in correct alignment    Baseline  hip pain improved by 75%, pelvis in correct alginment    Time  12    Period  Weeks    Status  On-going      PT LONG TERM GOAL #6   Title  able to lay on left hip for sleeping with minimal to no pain    Baseline  75% better    Time  12    Period  Weeks    Status  On-going      PT LONG TERM GOAL #7   Title  able to hold her child on the left hip due to the radiation soreness on the right breast with pain minimal to none    Baseline  50% better    Time  12    Period  Weeks    Status  On-going            Plan - 04/07/19 1245    Clinical Impression Statement  Pt reported she felt much better and looser after session.  She has ordered a swell spot and more close fitting camisoles to hold it in place.  She received relief from discomfort after treatment today    Comorbidities  recent chemo and radiation    Examination-Activity Limitations  Bathing;Reach Overhead;Caring for Others;Lift;Hygiene/Grooming;Locomotion Level;Sleep    PT Treatment/Interventions  ADLs/Self Care Home Management;Therapeutic  exercise;Manual techniques;Biofeedback;Electrical Stimulation;Therapeutic activities;Neuromuscular re-education;Patient/family education;Dry needling;Passive range of motion;Spinal Manipulations;Manual lymph drainage;Taping;Compression bandaging;Orthotic Fit/Training    PT Next Visit Plan  work on shoulder ROM, hip and core strength; dry needling, for ortho, manual lymph drainage and check on chip packs for cancer rehab    PT Home Exercise Plan  Access Code: [email or web address]       Patient will benefit from skilled therapeutic intervention in order to improve the following deficits and impairments:  Decreased knowledge of precautions, Decreased knowledge of use of DME, Postural dysfunction, Decreased range of motion, Increased fascial restricitons, Impaired UE functional use, Increased muscle spasms, Impaired perceived functional ability, Pain, Decreased scar mobility, Decreased strength, Increased edema, Difficulty walking, Decreased activity tolerance, Decreased mobility  Visit Diagnosis: Aftercare following surgery for neoplasm  Disorder of the skin and subcutaneous tissue related to radiation, unspecified     Problem List Patient Active Problem List   Diagnosis Date Noted  . History of right breast cancer 11/14/2018  . Chemotherapy induced neutropenia ([place]) 09/13/2018  . Port-A-Cath in place 06/10/2018  . Malignant neoplasm of upper-outer quadrant of right breast in female, estrogen receptor negative ([place]) 05/09/2018  . Low iron stores 01/16/2018  . Muscle spasm 01/15/2018  . Piriformis syndrome of left side 01/15/2018  . Grade I hemorrhoids 02/23/2017  . Low lying posterior placenta, antepartum 02/01/2016  . AMA (advanced maternal age) multigravida 35+ 11/22/2015  . 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 Calcification of right breast 02/19/2015  . Ligament tear 07/05/2012  Donato Heinz. Owens Shark PT   Norwood Levo 04/07/2019, 12:47 PM  [place], [place], [address] Phone: [phone]   Fax:  [phone]  Name: Darlene Grant MRN: [id number] Date of Birth: 17-Jun-1976

## 2019-04-07 NOTE — Patient Instructions (Addendum)
[email or web address] Lymphatic flow series with Shoosh Lettick Crotzer  Medbridge for pec minor stretches

## 2019-04-10 ENCOUNTER — Encounter: Payer: Self-pay | Admitting: Physical Therapy

## 2019-04-10 ENCOUNTER — Other Ambulatory Visit: Payer: Self-pay

## 2019-04-10 ENCOUNTER — Ambulatory Visit: Payer: BC Managed Care – PPO | Admitting: Physical Therapy

## 2019-04-10 DIAGNOSIS — M25611 Stiffness of right shoulder, not elsewhere classified: Secondary | ICD-10-CM

## 2019-04-10 DIAGNOSIS — Z483 Aftercare following surgery for neoplasm: Secondary | ICD-10-CM | POA: Diagnosis not present

## 2019-04-10 DIAGNOSIS — M6281 Muscle weakness (generalized): Secondary | ICD-10-CM | POA: Diagnosis not present

## 2019-04-10 DIAGNOSIS — M25552 Pain in left hip: Secondary | ICD-10-CM | POA: Diagnosis not present

## 2019-04-10 DIAGNOSIS — M25612 Stiffness of left shoulder, not elsewhere classified: Secondary | ICD-10-CM

## 2019-04-10 DIAGNOSIS — R293 Abnormal posture: Secondary | ICD-10-CM | POA: Diagnosis not present

## 2019-04-10 DIAGNOSIS — M25511 Pain in right shoulder: Secondary | ICD-10-CM

## 2019-04-10 NOTE — Therapy (Signed)
[facility] [address] [place], [place], [address] Phone: [phone]   Fax:  [phone]  Physical Therapy Treatment  Patient Details  Name: Darlene Grant MRN: [id number] Date of Birth: Dec 03, 1975 Referring Provider (PT): Dr. Nicholas Lose; Dr. Clovia Cuff   Encounter Date: 04/10/2019  PT End of Session - 04/10/19 1012    Visit Number  24    Date for PT Re-Evaluation  04/28/19   11/9 for ortho, 11/12 for breast   Authorization Type  [employer]    PT Start Time  0845    PT Stop Time  1005    PT Time Calculation (min)  80 min    Activity Tolerance  Patient tolerated treatment well;No increased pain    Behavior During Therapy  WFL for tasks assessed/performed       Past Medical History:  Diagnosis Date  . Abnormal Pap smear    ASCUS 03/13/11  . Anemia   . Breast pain, left   . Cancer ([place])   . Candida vaginitis   . Complication of anesthesia    pt states that all narcotics make her angry and she would prefer to avoid them.  . Rectal itching   . Vaginal Pap smear, abnormal     Past Surgical History:  Procedure Laterality Date  . BREAST BIOPSY Right 8/16  . CESAREAN SECTION N/A 11/21/2013   Procedure: CESAREAN SECTION;  Surgeon: Alwyn Pea, MD;  Location: [facility];  Service: Obstetrics;  Laterality: N/A;  . CESAREAN SECTION N/A 06/23/2016   Procedure: CESAREAN SECTION;  Surgeon: Emily Filbert, MD;  Location: [facility];  Service: Obstetrics;  Laterality: N/A;  . HAND SURGERY  04/2012   ligament repair  . MASTECTOMY WITH AXILLARY LYMPH NODE DISSECTION Bilateral 11/14/2018   Procedure: RIGHT MASTECTOMY WITH RIGHT AXILLARY LYMPH NODE DISSECTION AND LEFT RISK REDUCING MASTECTOMY;  Surgeon: Rolm Bookbinder, MD;  Location: [place];  Service: General;  Laterality: Bilateral;  LATE ENTRY: Corrected laterality documentation to read right axillary lymph node dissection vs. documented  laterality of left.    Marland Kitchen PILONIDAL CYST EXCISION  2000  . PORTACATH PLACEMENT N/A 05/28/2018   Procedure: INSERTION PORT-A-CATH WITH ULTRASOUND;  Surgeon: Rolm Bookbinder, MD;  Location: [place];  Service: General;  Laterality: N/A;  . WISDOM TOOTH EXTRACTION  AGE 35    There were no vitals filed for this visit.  Subjective Assessment - 04/10/19 0852    Subjective  I want to know what to use the theracane to work on my trigger points. The left hip pain is more intermittent.    Pertinent History  breast cancer diagnosed November 2019: triple negative with Ki of 75% She completed chemotherapy 10/18/2018. 11/14/2018: she underwent bilateral mastectomy with 0/13 nodes positive on the right; radiation ended on 02/10/2019    Patient Stated Goals  sleep without pain, exercise without discomfort, carry daughter on left due to radiation on the right breast    Currently in Pain?  Yes    Pain Score  1     Pain Location  Hip    Pain Orientation  Left    Pain Descriptors / Indicators  Tightness    Pain Type  Chronic pain    Pain Onset  More than a month ago    Pain Frequency  Intermittent    Aggravating Factors   pull in belly to engate her abdominals and bending to the right side  Pain Relieving Factors  movement, stretching    Multiple Pain Sites  Yes    Pain Score  2    Pain Location  Shoulder    Pain Orientation  Left    Pain Descriptors / Indicators  Aching;Sore;Tightness    Pain Type  Chronic pain    Pain Onset  More than a month ago    Pain Frequency  Intermittent    Aggravating Factors   external rotation, flexion    Pain Relieving Factors  yoga, movement continously         [facility] PT Assessment - 04/10/19 0001      Assessment   Medical Diagnosis  M25.552 left hip pain; C50.411,Z17.1 Malignant neoplasm of upper-outer quadrant of right breast in female, estrogen receptor negative    Referring Provider (PT)  Dr. Nicholas Lose; Dr. Clovia Cuff    Onset Date/Surgical  Date  06/23/16      AROM   Left Shoulder Flexion  175 Degrees    Left Shoulder ABduction  176 Degrees      Palpation   SI assessment   pelvis in correct alignment                   [facility] Adult PT Treatment/Exercise - 04/10/19 0001      Self-Care   Self-Care  Other Self-Care Comments    Other Self-Care Comments   education on using the theracane to the trigger points in her upper back and low back, using  a tennis ball to massage teh iliopsoas, mobilizing the thoracic spine,       Lumbar Exercises: Stretches   Passive Hamstring Stretch  Right;Left;1 rep;60 seconds    Lower Trunk Rotation  2 reps;60 seconds   bil. sides    Lower Trunk Rotation Limitations  therapist working on movement of L3-L5    Piriformis Stretch  Right;Left;1 rep;60 seconds   pigeon pose   Other Lumbar Stretch Exercise  yoga moves in lunge with trunk rotation, then with hip external rotation    Other Lumbar Stretch Exercise  sitting hip adductor stretch holding 1 min.       Knee/Hip Exercises: Aerobic   Stationary Bike  8 minutes level 3 -4  while assessing patient      Knee/Hip Exercises: Standing   Functional Squat  1 set;10 reps   holding green plyoball   Gait Training  half knee with holding the green plyoball 10x each direction      Shoulder Exercises: Supine   External Rotation  AAROM;Left;15 reps    Flexion  AAROM;Left;15 reps    ABduction  AAROM;Left;15 reps    Diagonals  AAROM;Strengthening;Left;15 reps   15x a/arom; 15 with resistance   Diagonals Limitations  started A/AROM then ended with resistance manually, both PNF directions      Manual Therapy   Manual Therapy  Soft tissue mobilization;Joint mobilization;Myofascial release    Joint Mobilization  P-A and rotational mobilizaiton to T3-L5 grade III; grade III left shoulder mobilizaiotn for inferior and posterior glide    Soft tissue mobilization  bil. upper trap and interscapular area to reduce trigger points, left quadratus     Myofascial Release  tissue rolling of the interscapular area       Trigger Point Dry Needling - 04/10/19 0001    Consent Given?  Yes    Education Handout Provided  Previously provided    Muscles Treated Head and Neck  Upper trapezius    Muscles Treated Upper Quadrant  Subscapularis    Muscles Treated Back/Hip  Lumbar multifidi   Left L3   Other Dry Needling  bil. sides T1-T3    Upper Trapezius Response  Twitch reponse elicited;Palpable increased muscle length   bil   Subscapularis Response  Twitch response elicited;Palpable increased muscle length   bil.    Lumbar multifidi Response  Twitch response elicited;Palpable increased muscle length           PT Education - 04/10/19 1018    Education Details  how to use the theracane for trigger points    Person(s) Educated  Patient    Methods  Explanation;Demonstration    Comprehension  Verbalized understanding;Returned demonstration       PT Short Term Goals - 03/31/19 1246      PT SHORT TERM GOAL #1   Title  independent with initial HEP for left hip    Time  4    Period  Weeks    Status  Achieved      PT SHORT TERM GOAL #2   Title  understand how to take care of vaginal health to reduce dryness and keep tissue mobility    Period  Weeks    Status  Achieved        PT Long Term Goals - 04/10/19 1011      PT LONG TERM GOAL #5   Title  Patient able to ambulate without left hip pain due to pelvis in correct alignment    Baseline  hip pain improved by 75%, pelvis in correct alginment    Time  12    Period  Weeks    Status  On-going      PT LONG TERM GOAL #6   Title  able to lay on left hip for sleeping with minimal to no pain    Baseline  75% better    Time  12    Period  Weeks    Status  On-going      PT LONG TERM GOAL #7   Title  able to hold her child on the left hip due to the radiation soreness on the right breast with pain minimal to none    Baseline  50% better    Time  12    Period  Weeks    Status   On-going            Plan - 04/10/19 1013    Clinical Impression Statement  Patient had full left shoulder ROM with minimal pain at endrange. Patient pelvis in correct alignment. Patient has learned yoga moves to further stretch her left hip and thoracic spine to reduce her pain. Patient is doing a lymphatci yoga for her lymph system and stretch. Patient is able to ambulate with less pain. Patient will benefit from skilled therapy to reduce pain and improve function to left shoulder and left hip.    Personal Factors and Comorbidities  Comorbidity 1;Sex    Comorbidities  recent chemo and radiation    Examination-Activity Limitations  Bathing;Reach Overhead;Caring for Others;Lift;Hygiene/Grooming;Locomotion Level;Sleep    Stability/Clinical Decision Making  Evolving/Moderate complexity    Rehab Potential  Good    PT Frequency  1x / week    PT Duration  12 weeks    PT Treatment/Interventions  ADLs/Self Care Home Management;Therapeutic exercise;Manual techniques;Biofeedback;Electrical Stimulation;Therapeutic activities;Neuromuscular re-education;Patient/family education;Dry needling;Passive range of motion;Spinal Manipulations;Manual lymph drainage;Taping;Compression bandaging;Orthotic Fit/Training    PT Next Visit Plan  work on shoulder ROM, hip and core strength; dry needling, for ortho,  manual lymph drainage and check on chip packs for cancer rehab    PT Home Exercise Plan  Access Code: [email or web address]    Consulted and Agree with Plan of Care  Patient       Patient will benefit from skilled therapeutic intervention in order to improve the following deficits and impairments:  Decreased knowledge of precautions, Decreased knowledge of use of DME, Postural dysfunction, Decreased range of motion, Increased fascial restricitons, Impaired UE functional use, Increased muscle spasms, Impaired perceived functional ability, Pain, Decreased scar mobility, Decreased strength, Increased edema, Difficulty walking,  Decreased activity tolerance, Decreased mobility  Visit Diagnosis: Aftercare following surgery for neoplasm  Pain in left hip  Stiffness of right shoulder joint  Acute pain of right shoulder  Muscle weakness (generalized)  Stiffness of left shoulder, not elsewhere classified     Problem List Patient Active Problem List   Diagnosis Date Noted  . History of right breast cancer 11/14/2018  . Chemotherapy induced neutropenia ([place]) 09/13/2018  . Port-A-Cath in place 06/10/2018  . Malignant neoplasm of upper-outer quadrant of right breast in female, estrogen receptor negative ([place]) 05/09/2018  . Low iron stores 01/16/2018  . Muscle spasm 01/15/2018  . Piriformis syndrome of left side 01/15/2018  . Grade I hemorrhoids 02/23/2017  . Low lying posterior placenta, antepartum 02/01/2016  . AMA (advanced maternal age) multigravida 35+ 11/22/2015  . 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 Calcification of right breast 02/19/2015  . Ligament tear 07/05/2012    Earlie Counts, PT 04/10/19 10:20 AM   [facility] [address] [place], [place], [address] Phone: [phone]   Fax:  [phone]  Name: Darlene Grant MRN: [id number] Date of Birth: Sep 19, 1975

## 2019-04-11 ENCOUNTER — Telehealth: Payer: Self-pay | Admitting: *Deleted

## 2019-04-11 NOTE — Telephone Encounter (Signed)
CALLED PATIENT TO INFORM OF FU WITH DR. KINARD ON 04/21/19 @  3 PM, LVM FOR A RETURN CALL

## 2019-04-14 ENCOUNTER — Telehealth: Payer: Self-pay

## 2019-04-14 ENCOUNTER — Ambulatory Visit: Payer: BC Managed Care – PPO | Admitting: Physical Therapy

## 2019-04-14 ENCOUNTER — Other Ambulatory Visit: Payer: Self-pay | Admitting: *Deleted

## 2019-04-14 ENCOUNTER — Encounter: Payer: Self-pay | Admitting: Physical Therapy

## 2019-04-14 ENCOUNTER — Other Ambulatory Visit: Payer: Self-pay

## 2019-04-14 DIAGNOSIS — Z483 Aftercare following surgery for neoplasm: Secondary | ICD-10-CM

## 2019-04-14 DIAGNOSIS — M25611 Stiffness of right shoulder, not elsewhere classified: Secondary | ICD-10-CM | POA: Diagnosis not present

## 2019-04-14 DIAGNOSIS — Z171 Estrogen receptor negative status [ER-]: Secondary | ICD-10-CM

## 2019-04-14 DIAGNOSIS — M6281 Muscle weakness (generalized): Secondary | ICD-10-CM | POA: Diagnosis not present

## 2019-04-14 DIAGNOSIS — M25511 Pain in right shoulder: Secondary | ICD-10-CM | POA: Diagnosis not present

## 2019-04-14 DIAGNOSIS — M542 Cervicalgia: Secondary | ICD-10-CM

## 2019-04-14 DIAGNOSIS — R293 Abnormal posture: Secondary | ICD-10-CM | POA: Diagnosis not present

## 2019-04-14 DIAGNOSIS — M25552 Pain in left hip: Secondary | ICD-10-CM | POA: Diagnosis not present

## 2019-04-14 DIAGNOSIS — L599 Disorder of the skin and subcutaneous tissue related to radiation, unspecified: Secondary | ICD-10-CM | POA: Diagnosis not present

## 2019-04-14 DIAGNOSIS — M25612 Stiffness of left shoulder, not elsewhere classified: Secondary | ICD-10-CM | POA: Diagnosis not present

## 2019-04-14 DIAGNOSIS — C50411 Malignant neoplasm of upper-outer quadrant of right female breast: Secondary | ICD-10-CM

## 2019-04-14 NOTE — Therapy (Signed)
[place], [place], [address] Phone: [phone]   Fax:  [phone]  Physical Therapy Treatment  Patient Details  Name: Darlene Grant MRN: [id number] Date of Birth: 1975/07/19 Referring Provider (PT): Dr. Nicholas Lose; Dr. Clovia Cuff   Encounter Date: 04/14/2019  PT End of Session - 04/14/19 0958    Visit Number  25    Number of Visits  29    Date for PT Re-Evaluation  04/28/19   05/01/2019 for lymphedema   PT Start Time  0803    PT Stop Time  [id number]    PT Time Calculation (min)  52 min    Activity Tolerance  Patient tolerated treatment well       Past Medical History:  Diagnosis Date  . Abnormal Pap smear    ASCUS 03/13/11  . Anemia   . Breast pain, left   . Cancer ([place])   . Candida vaginitis   . Complication of anesthesia    pt states that all narcotics make her angry and she would prefer to avoid them.  . Rectal itching   . Vaginal Pap smear, abnormal     Past Surgical History:  Procedure Laterality Date  . BREAST BIOPSY Right 8/16  . CESAREAN SECTION N/A 11/21/2013   Procedure: CESAREAN SECTION;  Surgeon: Alwyn Pea, MD;  Location: [facility];  Service: Obstetrics;  Laterality: N/A;  . CESAREAN SECTION N/A 06/23/2016   Procedure: CESAREAN SECTION;  Surgeon: Emily Filbert, MD;  Location: [facility];  Service: Obstetrics;  Laterality: N/A;  . HAND SURGERY  04/2012   ligament repair  . MASTECTOMY WITH AXILLARY LYMPH NODE DISSECTION Bilateral 11/14/2018   Procedure: RIGHT MASTECTOMY WITH RIGHT AXILLARY LYMPH NODE DISSECTION AND LEFT RISK REDUCING MASTECTOMY;  Surgeon: Rolm Bookbinder, MD;  Location: [facility];  Service: General;  Laterality: Bilateral;  LATE ENTRY: Corrected laterality documentation to read right axillary lymph node dissection vs. documented laterality of left.    Marland Kitchen PILONIDAL CYST EXCISION  2000  . PORTACATH PLACEMENT N/A 05/28/2018    Procedure: INSERTION PORT-A-CATH WITH ULTRASOUND;  Surgeon: Rolm Bookbinder, MD;  Location: [facility];  Service: General;  Laterality: N/A;  . WISDOM TOOTH EXTRACTION  AGE 43    There were no vitals filed for this visit.  Subjective Assessment - 04/14/19 0952    Subjective  Pt reports she is having some fullness and achiness in her neck since radiation Dr. Sondra Come told her she could have CT scan if this persisted and she wants to have this done.  She has been wearing her swell spot to posterior axilla in a compression cami and feels that it is better.  She has some fullness in anterior shoulder and will wear the swell spot there    Pertinent History  breast cancer diagnosed November 2019: triple negative with Ki of 75% She completed chemotherapy 10/18/2018. 11/14/2018: she underwent bilateral mastectomy with 0/13 nodes positive on the right; radiation ended on 02/10/2019    Currently in Pain?  Yes    Pain Score  1     Pain Location  Neck    Pain Orientation  Right;Left    Pain Descriptors / Indicators  Aching    Pain Type  Chronic pain    Pain Onset  More than a month ago    Aggravating Factors   not able to say    Pain Relieving Factors  can't say  [facility] Adult PT Treatment/Exercise - 04/14/19 0001      Exercises   Exercises  Neck;Shoulder    Other Exercises   deep breathing with neck and upper thoracic mobility includeing 5 reps of rotation to each side    good rib excursion ant, post and  laterally with deep breath     Shoulder Exercises: Supine   Other Supine Exercises  AROM and stretching of right shoulder and trunk       Manual Therapy   Manual Lymphatic Drainage (MLD)  in supine for short neck and adominal scooping, right inguinal nodes and right axillo-inguinal anastamosis, left axillary nodes and anterior interaxillary anastamosis and chest, then to left sidelying for  posterior interaxillay anastamosis being careful not to  overstretch radiated skin  Extra time spent on anterior chest and neck with skin stretch toward posterior neck                PT Short Term Goals - 03/31/19 1246      PT SHORT TERM GOAL #1   Title  independent with initial HEP for left hip    Time  4    Period  Weeks    Status  Achieved      PT SHORT TERM GOAL #2   Title  understand how to take care of vaginal health to reduce dryness and keep tissue mobility    Period  Weeks    Status  Achieved        PT Long Term Goals - 04/10/19 1011      PT LONG TERM GOAL #5   Title  Patient able to ambulate without left hip pain due to pelvis in correct alignment    Baseline  hip pain improved by 75%, pelvis in correct alginment    Time  12    Period  Weeks    Status  On-going      PT LONG TERM GOAL #6   Title  able to lay on left hip for sleeping with minimal to no pain    Baseline  75% better    Time  12    Period  Weeks    Status  On-going      PT LONG TERM GOAL #7   Title  able to hold her child on the left hip due to the radiation soreness on the right breast with pain minimal to none    Baseline  50% better    Time  12    Period  Weeks    Status  On-going            Plan - 04/14/19 0959    Clinical Impression Statement  Pt concerned about fullness and ache she has had in her neck.  Added more time to anteior chest and neck to MLD sequene today to see if that will help decongest this area and help with symptoms.  Continued with neck and upper thoracic ROM and right shoudler ROM during session also.  Visible and palpable fullness in right anterior shoulder that seemed to soften after session    Examination-Activity Limitations  Bathing;Reach Overhead;Caring for Others;Lift;Hygiene/Grooming;Locomotion Level;Sleep    Stability/Clinical Decision Making  Evolving/Moderate complexity    Rehab Potential  Good    PT Frequency  1x / week    PT Duration  12 weeks    PT Treatment/Interventions  ADLs/Self Care Home  Management;Therapeutic exercise;Manual techniques;Biofeedback;Electrical Stimulation;Therapeutic activities;Neuromuscular re-education;Patient/family education;Dry needling;Passive range of motion;Spinal Manipulations;Manual lymph drainage;Taping;Compression bandaging;Orthotic Fit/Training  PT Next Visit Plan  work on shoulder ROM, hip and core strength; dry needling, for ortho, manual lymph drainage and check on chip packs for cancer rehab       Patient will benefit from skilled therapeutic intervention in order to improve the following deficits and impairments:  Decreased knowledge of precautions, Decreased knowledge of use of DME, Postural dysfunction, Decreased range of motion, Increased fascial restricitons, Impaired UE functional use, Increased muscle spasms, Impaired perceived functional ability, Pain, Decreased scar mobility, Decreased strength, Increased edema, Difficulty walking, Decreased activity tolerance, Decreased mobility  Visit Diagnosis: Aftercare following surgery for neoplasm  Stiffness of right shoulder joint  Abnormal posture     Problem List Patient Active Problem List   Diagnosis Date Noted  . History of right breast cancer 11/14/2018  . Chemotherapy induced neutropenia ([place]) 09/13/2018  . Port-A-Cath in place 06/10/2018  . Malignant neoplasm of upper-outer quadrant of right breast in female, estrogen receptor negative ([place]) 05/09/2018  . Low iron stores 01/16/2018  . Muscle spasm 01/15/2018  . Piriformis syndrome of left side 01/15/2018  . Grade I hemorrhoids 02/23/2017  . Low lying posterior placenta, antepartum 02/01/2016  . AMA (advanced maternal age) multigravida 35+ 11/22/2015  . 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 Calcification of right breast 02/19/2015  . Ligament tear 07/05/2012   Donato Heinz. Owens Shark PT  Norwood Levo 04/14/2019, 10:04 AM  [place], [place], [address] Phone: [phone]   Fax:  [phone]  Name: ASHAUNTE LONER MRN: [id number] Date of Birth: 07-02-1975

## 2019-04-14 NOTE — Telephone Encounter (Signed)
Attempted return of pt's VM. Pt adamant that she only wants Dr. Sondra Come to order CT scan of head/neck and states "he told me that he would just order it. I don't know why I need to see him first. I'm going to try to get my regular oncologist to order it".   This RN left detailed VM that Dr. Sondra Come wanted to physically examine pt to see if swelling had enlarged prior to ordering CT scan. This RN's direct number left on VM for any further questions/concerns. Loma Sousa, RN BSN

## 2019-04-17 ENCOUNTER — Other Ambulatory Visit: Payer: Self-pay

## 2019-04-17 ENCOUNTER — Encounter: Payer: Self-pay | Admitting: Physical Therapy

## 2019-04-17 ENCOUNTER — Ambulatory Visit: Payer: BC Managed Care – PPO | Admitting: Physical Therapy

## 2019-04-17 DIAGNOSIS — M6281 Muscle weakness (generalized): Secondary | ICD-10-CM | POA: Diagnosis not present

## 2019-04-17 DIAGNOSIS — Z483 Aftercare following surgery for neoplasm: Secondary | ICD-10-CM | POA: Diagnosis not present

## 2019-04-17 DIAGNOSIS — R293 Abnormal posture: Secondary | ICD-10-CM | POA: Diagnosis not present

## 2019-04-17 DIAGNOSIS — M25552 Pain in left hip: Secondary | ICD-10-CM

## 2019-04-17 DIAGNOSIS — M25611 Stiffness of right shoulder, not elsewhere classified: Secondary | ICD-10-CM | POA: Diagnosis not present

## 2019-04-17 DIAGNOSIS — M25612 Stiffness of left shoulder, not elsewhere classified: Secondary | ICD-10-CM | POA: Diagnosis not present

## 2019-04-17 DIAGNOSIS — M25511 Pain in right shoulder: Secondary | ICD-10-CM | POA: Diagnosis not present

## 2019-04-17 NOTE — Patient Instructions (Addendum)
[facility] [address], [place], [place] [address] Phone # [phone] Fax [phone]

## 2019-04-17 NOTE — Therapy (Signed)
[facility] [address] [place], [place], [address] Phone: [phone]   Fax:  [phone]  Physical Therapy Treatment  Patient Details  Name: Darlene Grant MRN: [id number] Date of Birth: 31-Mar-1976 Referring Provider (PT): Dr. Nicholas Lose; Dr. Clovia Cuff   Encounter Date: 04/17/2019  PT End of Session - 04/17/19 0956    Visit Number  26    Date for PT Re-Evaluation  04/28/19   05/01/2019 for lympedema   Authorization Type  [employer]    PT Start Time  0845    PT Stop Time  0955    PT Time Calculation (min)  70 min    Activity Tolerance  Patient tolerated treatment well    Behavior During Therapy  [facility] for tasks assessed/performed       Past Medical History:  Diagnosis Date  . Abnormal Pap smear    ASCUS 03/13/11  . Anemia   . Breast pain, left   . Cancer ([place])   . Candida vaginitis   . Complication of anesthesia    pt states that all narcotics make her angry and she would prefer to avoid them.  . Rectal itching   . Vaginal Pap smear, abnormal     Past Surgical History:  Procedure Laterality Date  . BREAST BIOPSY Right 8/16  . CESAREAN SECTION N/A 11/21/2013   Procedure: CESAREAN SECTION;  Surgeon: Alwyn Pea, MD;  Location: [facility];  Service: Obstetrics;  Laterality: N/A;  . CESAREAN SECTION N/A 06/23/2016   Procedure: CESAREAN SECTION;  Surgeon: Emily Filbert, MD;  Location: [facility];  Service: Obstetrics;  Laterality: N/A;  . HAND SURGERY  04/2012   ligament repair  . MASTECTOMY WITH AXILLARY LYMPH NODE DISSECTION Bilateral 11/14/2018   Procedure: RIGHT MASTECTOMY WITH RIGHT AXILLARY LYMPH NODE DISSECTION AND LEFT RISK REDUCING MASTECTOMY;  Surgeon: Rolm Bookbinder, MD;  Location: [facility];  Service: General;  Laterality: Bilateral;  LATE ENTRY: Corrected laterality documentation to read right axillary lymph node dissection vs. documented laterality of left.    Marland Kitchen  PILONIDAL CYST EXCISION  2000  . PORTACATH PLACEMENT N/A 05/28/2018   Procedure: INSERTION PORT-A-CATH WITH ULTRASOUND;  Surgeon: Rolm Bookbinder, MD;  Location: [facility];  Service: General;  Laterality: N/A;  . WISDOM TOOTH EXTRACTION  AGE 43    There were no vitals filed for this visit.  Subjective Assessment - 04/17/19 0849    Subjective  I was very sore till Sunday and Monday after last visit with you. I have a CT scan next Tuesday due to feeling of swelling in neck.    Pertinent History  breast cancer diagnosed November 2019: triple negative with Ki of 75% She completed chemotherapy 10/18/2018. 11/14/2018: she underwent bilateral mastectomy with 0/13 nodes positive on the right; radiation ended on 02/10/2019    Patient Stated Goals  sleep without pain, exercise without discomfort, carry daughter on left due to radiation on the right breast    Currently in Pain?  Yes    Pain Score  1     Pain Location  Neck    Pain Orientation  Right;Left    Pain Descriptors / Indicators  Aching    Pain Type  Chronic pain    Pain Onset  More than a month ago    Pain Frequency  Constant    Aggravating Factors   not able to say    Pain Relieving Factors  can't say  Multiple Pain Sites  Yes    Pain Score  1    Pain Location  Shoulder    Pain Orientation  Left    Pain Descriptors / Indicators  Aching;Sore;Tightness    Pain Type  Chronic pain    Pain Onset  More than a month ago    Pain Frequency  Intermittent    Aggravating Factors   external rotation, flexion, reaching backward    Pain Relieving Factors  yoga, movement continously         [facility] PT Assessment - 04/17/19 0001      Strength   Right Hip Internal Rotation  4-/5    Left Hip Flexion  4/5    Left Hip Extension  4/5    Left Hip Internal Rotation  5/5    Left Hip ABduction  4+/5                   [facility] Adult PT Treatment/Exercise - 04/17/19 0001      Self-Care   Self-Care  Other Self-Care Comments     Other Self-Care Comments   information on meditation apps to reduce stress and anxiety      Lumbar Exercises: Stretches   Lower Trunk Rotation  60 seconds   stretch spine with lumbar roll   Hip Flexor Stretch  Right;Left;60 seconds   prone on foam roll   Quad Stretch  Right;Left;1 rep;30 seconds   sidely with therapist stretch   Quad Stretch Limitations  used foam  roll under leg and strap    ITB Stretch  Right;Left;60 seconds   using foam roll   Piriformis Stretch  Right;Left;1 rep;60 seconds   foam roller     Lumbar Exercises: Supine   Pelvic Tilt  20 reps    Pelvic Tilt Limitations  with disc under hips   then do diagonals and circles CC/CW   Bridge  15 reps   feet on foam roll   Bridge Limitations  right arm to 90 degrees and left full flexion    Other Supine Lumbar Exercises  supine with knees and hips flexed rotate side to side and hold 10 sec working the abdominals and stretch lumbar      Knee/Hip Exercises: Aerobic   Stationary Bike  10 minutes level 3 -4  while assessing patient      Knee/Hip Exercises: Standing   Functional Squat  1 set;10 reps;5 seconds    Functional Squat Limitations  with bil. shoulder flexion, chair pose      Shoulder Exercises: Standing   Other Standing Exercises  tricep push up on mid level mat 10x monitoring for pain             PT Education - 04/17/19 0954    Education Details  information on meditation apps       PT Short Term Goals - 03/31/19 1246      PT SHORT TERM GOAL #1   Title  independent with initial HEP for left hip    Time  4    Period  Weeks    Status  Achieved      PT SHORT TERM GOAL #2   Title  understand how to take care of vaginal health to reduce dryness and keep tissue mobility    Period  Weeks    Status  Achieved        PT Long Term Goals - 04/10/19 1011      PT LONG TERM GOAL #5  Title  Patient able to ambulate without left hip pain due to pelvis in correct alignment    Baseline  hip pain  improved by 75%, pelvis in correct alginment    Time  12    Period  Weeks    Status  On-going      PT LONG TERM GOAL #6   Title  able to lay on left hip for sleeping with minimal to no pain    Baseline  75% better    Time  12    Period  Weeks    Status  On-going      PT LONG TERM GOAL #7   Title  able to hold her child on the left hip due to the radiation soreness on the right breast with pain minimal to none    Baseline  50% better    Time  12    Period  Weeks    Status  On-going            Plan - 04/17/19 0957    Clinical Impression Statement  Patient was able to fully rotate to the right after repetitive  rotation on her side. Patient exercises incorporated stretches with strength of the upper and lower body. Patient is going for a CT scan next week due to upper neck pain and swollen feeling. Patient still has crepitus feeling in left shoulder at endrange. Patient will benefit from skilled therapy to improve ROM and strength while reducing pain.    Comorbidities  recent chemo and radiation    Examination-Activity Limitations  Bathing;Reach Overhead;Caring for Others;Lift;Hygiene/Grooming;Locomotion Level;Sleep    Stability/Clinical Decision Making  Evolving/Moderate complexity    Rehab Potential  Good    PT Frequency  1x / week    PT Duration  12 weeks    PT Treatment/Interventions  ADLs/Self Care Home Management;Therapeutic exercise;Manual techniques;Biofeedback;Electrical Stimulation;Therapeutic activities;Neuromuscular re-education;Patient/family education;Dry needling;Passive range of motion;Spinal Manipulations;Manual lymph drainage;Taping;Compression bandaging;Orthotic Fit/Training    PT Next Visit Plan  work on shoulder ROM, hip and core strength; dry needling, for ortho, manual lymph drainage and check on chip packs for cancer rehab    PT Home Exercise Plan  Access Code: [email or web address]    Consulted and Agree with Plan of Care  Patient       Patient will benefit from  skilled therapeutic intervention in order to improve the following deficits and impairments:  Decreased knowledge of precautions, Decreased knowledge of use of DME, Postural dysfunction, Decreased range of motion, Increased fascial restricitons, Impaired UE functional use, Increased muscle spasms, Impaired perceived functional ability, Pain, Decreased scar mobility, Decreased strength, Increased edema, Difficulty walking, Decreased activity tolerance, Decreased mobility  Visit Diagnosis: Aftercare following surgery for neoplasm  Pain in left hip  Muscle weakness (generalized)  Stiffness of left shoulder, not elsewhere classified     Problem List Patient Active Problem List   Diagnosis Date Noted  . History of right breast cancer 11/14/2018  . Chemotherapy induced neutropenia ([place]) 09/13/2018  . Port-A-Cath in place 06/10/2018  . Malignant neoplasm of upper-outer quadrant of right breast in female, estrogen receptor negative ([place]) 05/09/2018  . Low iron stores 01/16/2018  . Muscle spasm 01/15/2018  . Piriformis syndrome of left side 01/15/2018  . Grade I hemorrhoids 02/23/2017  . Low lying posterior placenta, antepartum 02/01/2016  . AMA (advanced maternal age) multigravida 35+ 11/22/2015  . 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 Calcification of right breast 02/19/2015  . Ligament tear 07/05/2012    Earlie Counts, PT  04/17/19 10:03 AM   [facility] [address], [place], [place], [address] Phone: [phone]   Fax:  [phone]  Name: LEIGHA MCCARY MRN: [id number] Date of Birth: 1975/08/05

## 2019-04-18 ENCOUNTER — Encounter: Payer: Self-pay | Admitting: General Practice

## 2019-04-18 NOTE — Progress Notes (Signed)
[place] Spiritual Care Note  Referred by breast navigator Dawn Stuart/RN for spiritual and emotional support for anticipatory stress related to upcoming scans. Darlene Grant and I are acquainted through church activities 20 years ago, and she welcomed the opportunity to reconnect in this capacity. We talked about challenges of parenting young children (currently 2 and 5) on top of chemo, surgery, and radiation; normalized the experience of the "emotional whiplash" (my term) of reaching the (an) end of treatment and then wondering, "What on earth just happened to me in this last year?" [name] found this perspective very relatable. She reports strong faith and prayer life through this process, but also a desire to connect with others who can relate personally.  Will place [facility] Guide and counseling referrals per pt request for normalization of feelings and opportunity to process the enormity/sigificance of life changes in the past 10 months. I also plan to f/u by phone for further Spiritual Care conversation, empathic listening, and pastoral reflection.   [place], [place], [facility] Pager [phone] Voicemail [phone]

## 2019-04-21 ENCOUNTER — Encounter: Payer: Self-pay | Admitting: Physical Therapy

## 2019-04-21 ENCOUNTER — Other Ambulatory Visit: Payer: Self-pay

## 2019-04-21 ENCOUNTER — Ambulatory Visit
Admission: RE | Admit: 2019-04-21 | Payer: BC Managed Care – PPO | Source: Ambulatory Visit | Admitting: Radiation Oncology

## 2019-04-21 ENCOUNTER — Ambulatory Visit: Payer: BC Managed Care – PPO | Attending: General Surgery | Admitting: Physical Therapy

## 2019-04-21 DIAGNOSIS — M6281 Muscle weakness (generalized): Secondary | ICD-10-CM | POA: Diagnosis not present

## 2019-04-21 DIAGNOSIS — M25511 Pain in right shoulder: Secondary | ICD-10-CM | POA: Diagnosis not present

## 2019-04-21 DIAGNOSIS — L599 Disorder of the skin and subcutaneous tissue related to radiation, unspecified: Secondary | ICD-10-CM | POA: Insufficient documentation

## 2019-04-21 DIAGNOSIS — M25611 Stiffness of right shoulder, not elsewhere classified: Secondary | ICD-10-CM | POA: Diagnosis not present

## 2019-04-21 DIAGNOSIS — I972 Postmastectomy lymphedema syndrome: Secondary | ICD-10-CM | POA: Diagnosis not present

## 2019-04-21 DIAGNOSIS — Z483 Aftercare following surgery for neoplasm: Secondary | ICD-10-CM

## 2019-04-21 DIAGNOSIS — R293 Abnormal posture: Secondary | ICD-10-CM | POA: Insufficient documentation

## 2019-04-21 NOTE — Therapy (Signed)
[place], [place], [address] Phone: [phone]   Fax:  [phone]  Physical Therapy Treatment  Patient Details  Name: Darlene Grant MRN: [id number] Date of Birth: [birth year] Referring Provider (PT): Dr. Nicholas Lose; Dr. Clovia Cuff   Encounter Date: 04/21/2019  PT End of Session - 04/21/19 1118    Visit Number  27    Number of Visits  29    Date for PT Re-Evaluation  04/28/19   11/12/ for lymphedema   PT Start Time  1000    PT Stop Time  1045    PT Time Calculation (min)  45 min    Activity Tolerance  Patient tolerated treatment well    Behavior During Therapy  [facility] for tasks assessed/performed       Past Medical History:  Diagnosis Date  . Abnormal Pap smear    ASCUS 03/13/11  . Anemia   . Breast pain, left   . Cancer ([place])   . Candida vaginitis   . Complication of anesthesia    pt states that all narcotics make her angry and she would prefer to avoid them.  . Rectal itching   . Vaginal Pap smear, abnormal     Past Surgical History:  Procedure Laterality Date  . BREAST BIOPSY Right 8/16  . CESAREAN SECTION N/A 11/21/2013   Procedure: CESAREAN SECTION;  Surgeon: Alwyn Pea, MD;  Location: [facility];  Service: Obstetrics;  Laterality: N/A;  . CESAREAN SECTION N/A 06/23/2016   Procedure: CESAREAN SECTION;  Surgeon: Emily Filbert, MD;  Location: [place];  Service: Obstetrics;  Laterality: N/A;  . HAND SURGERY  04/2012   ligament repair  . MASTECTOMY WITH AXILLARY LYMPH NODE DISSECTION Bilateral 11/14/2018   Procedure: RIGHT MASTECTOMY WITH RIGHT AXILLARY LYMPH NODE DISSECTION AND LEFT RISK REDUCING MASTECTOMY;  Surgeon: Rolm Bookbinder, MD;  Location: [place];  Service: General;  Laterality: Bilateral;  LATE ENTRY: Corrected laterality documentation to read right axillary lymph node dissection vs. documented laterality of left.    Marland Kitchen PILONIDAL CYST  EXCISION  2000  . PORTACATH PLACEMENT N/A 05/28/2018   Procedure: INSERTION PORT-A-CATH WITH ULTRASOUND;  Surgeon: Rolm Bookbinder, MD;  Location: [place];  Service: General;  Laterality: N/A;  . WISDOM TOOTH EXTRACTION  AGE 22    There were no vitals filed for this visit.  Subjective Assessment - 04/21/19 1006    Subjective  Pt is having the scan to her head and neck in the morning. She is having alot anxiety about it and and also about her family situation at home. Pt is using the resources from [facility] and being proactive about getting help for herself.    Pertinent History  breast cancer diagnosed November 2019: triple negative with Ki of 75% She completed chemotherapy 10/18/2018. 11/14/2018: she underwent bilateral mastectomy with 0/13 nodes positive on the right; radiation ended on 02/10/2019    Patient Stated Goals  sleep without pain, exercise without discomfort, carry daughter on left due to radiation on the right breast    Currently in Pain?  Yes    Pain Score  1     Pain Location  Neck    Pain Orientation  Right;Left    Pain Descriptors / Indicators  Aching    Pain Type  Chronic pain                       [facility] Adult  PT Treatment/Exercise - 04/21/19 0001      Exercises   Exercises  Neck;Shoulder    Other Exercises   deep breathing with neck and upper thoracic mobility includeing 5 reps of rotation to each side    good rib excursion ant, post and  laterally with deep breath     Lumbar Exercises: Supine   Other Supine Lumbar Exercises  purple ball under sacrum for anterior/ posterior and lateral pelvic tilts       Shoulder Exercises: Supine   Other Supine Exercises  purple ball under thoracic spine with folded towel under head for scapular retraction and protraction alternating shoiulder flexion and extension and horizontal abduction       Manual Therapy   Manual Lymphatic Drainage (MLD)  in supine for short neck and adominal scooping, right  inguinal nodes and right axillo-inguinal anastamosis, left axillary nodes and anterior interaxillary anastamosis and chest, then to left sidelying for  posterior interaxillay anastamosis being careful not to overstretch radiated skin  Extra time spent on anterior chest and neck with skin stretch toward posterior neck                PT Short Term Goals - 03/31/19 1246      PT SHORT TERM GOAL #1   Title  independent with initial HEP for left hip    Time  4    Period  Weeks    Status  Achieved      PT SHORT TERM GOAL #2   Title  understand how to take care of vaginal health to reduce dryness and keep tissue mobility    Period  Weeks    Status  Achieved        PT Long Term Goals - 04/10/19 1011      PT LONG TERM GOAL #5   Title  Patient able to ambulate without left hip pain due to pelvis in correct alignment    Baseline  hip pain improved by 75%, pelvis in correct alginment    Time  12    Period  Weeks    Status  On-going      PT LONG TERM GOAL #6   Title  able to lay on left hip for sleeping with minimal to no pain    Baseline  75% better    Time  12    Period  Weeks    Status  On-going      PT LONG TERM GOAL #7   Title  able to hold her child on the left hip due to the radiation soreness on the right breast with pain minimal to none    Baseline  50% better    Time  12    Period  Weeks    Status  On-going            Plan - 04/21/19 1120    Clinical Impression Statement  Pt continues to have darkening of skin and fullness in right lateral chest with tightness in ribs and shoulder post radiaton.  She reports that she feels better after session and would benefit from continued PT to help manage her symptoms as use of swell spot and compression shirt are helping, but not resolving her symptoms    Comorbidities  recent chemo and radiation    Examination-Activity Limitations  Bathing;Reach Overhead;Caring for Others;Lift;Hygiene/Grooming;Locomotion Level;Sleep     PT Treatment/Interventions  ADLs/Self Care Home Management;Therapeutic exercise;Manual techniques;Biofeedback;Electrical Stimulation;Therapeutic activities;Neuromuscular re-education;Patient/family education;Dry needling;Passive range of motion;Spinal Manipulations;Manual lymph drainage;Taping;Compression bandaging;Orthotic  Fit/Training    PT Next Visit Plan  work on shoulder ROM, hip and core strength; dry needling, for ortho, manual lymph drainage and reassess goals and add closed chain UE exercise for lymph    Consulted and Agree with Plan of Care  Patient       Patient will benefit from skilled therapeutic intervention in order to improve the following deficits and impairments:  Decreased knowledge of precautions, Decreased knowledge of use of DME, Postural dysfunction, Decreased range of motion, Increased fascial restricitons, Impaired UE functional use, Increased muscle spasms, Impaired perceived functional ability, Pain, Decreased scar mobility, Decreased strength, Increased edema, Difficulty walking, Decreased activity tolerance, Decreased mobility  Visit Diagnosis: Aftercare following surgery for neoplasm  Stiffness of right shoulder joint  Abnormal posture  Acute pain of right shoulder  Disorder of the skin and subcutaneous tissue related to radiation, unspecified     Problem List Patient Active Problem List   Diagnosis Date Noted  . History of right breast cancer 11/14/2018  . Chemotherapy induced neutropenia ([place]) 09/13/2018  . Port-A-Cath in place 06/10/2018  . Malignant neoplasm of upper-outer quadrant of right breast in female, estrogen receptor negative ([place]) 05/09/2018  . Low iron stores 01/16/2018  . Muscle spasm 01/15/2018  . Piriformis syndrome of left side 01/15/2018  . Grade I hemorrhoids 02/23/2017  . Low lying posterior placenta, antepartum 02/01/2016  . AMA (advanced maternal age) multigravida 35+ 11/22/2015  . 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  Calcification of right breast 02/19/2015  . Ligament tear 07/05/2012   Donato Heinz. Owens Shark PT  Norwood Levo 04/21/2019, 11:27 AM  [place], [place], [address] Phone: [phone]   Fax:  [phone]  Name: Darlene Grant MRN: [id number] Date of Birth: 04/30/1976

## 2019-04-21 NOTE — Progress Notes (Signed)
Called patient to schedule a counseling appointment, but she stated that she was unable to talk at that time. Counselor will call patient back later today to schedule the appt.  Art Buff [place] Counseling Intern Voicemail:  [phone]

## 2019-04-22 ENCOUNTER — Other Ambulatory Visit: Payer: Self-pay | Admitting: Hematology and Oncology

## 2019-04-22 ENCOUNTER — Telehealth: Payer: Self-pay | Admitting: *Deleted

## 2019-04-22 ENCOUNTER — Ambulatory Visit (INDEPENDENT_AMBULATORY_CARE_PROVIDER_SITE_OTHER): Payer: BC Managed Care – PPO

## 2019-04-22 ENCOUNTER — Telehealth: Payer: Self-pay | Admitting: Hematology and Oncology

## 2019-04-22 DIAGNOSIS — Z171 Estrogen receptor negative status [ER-]: Secondary | ICD-10-CM

## 2019-04-22 DIAGNOSIS — C50411 Malignant neoplasm of upper-outer quadrant of right female breast: Secondary | ICD-10-CM | POA: Diagnosis not present

## 2019-04-22 DIAGNOSIS — M542 Cervicalgia: Secondary | ICD-10-CM

## 2019-04-22 DIAGNOSIS — C50919 Malignant neoplasm of unspecified site of unspecified female breast: Secondary | ICD-10-CM | POA: Diagnosis not present

## 2019-04-22 DIAGNOSIS — R221 Localized swelling, mass and lump, neck: Secondary | ICD-10-CM | POA: Diagnosis not present

## 2019-04-22 DIAGNOSIS — R220 Localized swelling, mass and lump, head: Secondary | ICD-10-CM | POA: Diagnosis not present

## 2019-04-22 MED ORDER — IOHEXOL 300 MG/ML  SOLN
100.0000 mL | Freq: Once | INTRAMUSCULAR | Status: AC | PRN
  Administered 2019-04-22: 80 mL via INTRAVENOUS

## 2019-04-22 NOTE — Telephone Encounter (Signed)
"  Eliezer Lofts, [facility] [phone]).  Patient here for CT head with and without contrast and soft tissue neck without.  We do not do soft tissue without.   She is experiencing swelling and pain to her neck.  Does the doctor want soft tissue with contrast?"  Verbal order received and read back from Dr. Lindi Adie for soft tissue neck with contrast.  Order given to [facility] with this call.

## 2019-04-22 NOTE — Telephone Encounter (Signed)
Left message for the patient that the CT of the head and neck did not show any evidence of metastatic disease.

## 2019-04-23 ENCOUNTER — Encounter: Payer: Self-pay | Admitting: Hematology and Oncology

## 2019-04-24 ENCOUNTER — Ambulatory Visit: Payer: BC Managed Care – PPO | Attending: Hematology and Oncology | Admitting: Physical Therapy

## 2019-04-24 ENCOUNTER — Encounter: Payer: Self-pay | Admitting: Physical Therapy

## 2019-04-24 ENCOUNTER — Other Ambulatory Visit: Payer: Self-pay

## 2019-04-24 DIAGNOSIS — M25552 Pain in left hip: Secondary | ICD-10-CM | POA: Insufficient documentation

## 2019-04-24 DIAGNOSIS — Z483 Aftercare following surgery for neoplasm: Secondary | ICD-10-CM | POA: Insufficient documentation

## 2019-04-24 DIAGNOSIS — M25612 Stiffness of left shoulder, not elsewhere classified: Secondary | ICD-10-CM | POA: Diagnosis not present

## 2019-04-24 DIAGNOSIS — M25611 Stiffness of right shoulder, not elsewhere classified: Secondary | ICD-10-CM | POA: Insufficient documentation

## 2019-04-24 DIAGNOSIS — M6281 Muscle weakness (generalized): Secondary | ICD-10-CM | POA: Diagnosis not present

## 2019-04-24 NOTE — Therapy (Signed)
[facility] [address] [place], [place], [address] Phone: [phone]   Fax:  [phone]  Physical Therapy Treatment  Patient Details  Name: Darlene Grant MRN: [id number] Date of Birth: [birth year] Referring Provider (PT): Dr. Nicholas Lose; Dr. Clovia Cuff   Encounter Date: 04/24/2019  PT End of Session - 04/24/19 0901    Visit Number  28    Date for PT Re-Evaluation  06/19/19    Authorization Type  [employer]    PT Start Time  0845    PT Stop Time  1000    PT Time Calculation (min)  75 min    Activity Tolerance  Patient tolerated treatment well    Behavior During Therapy  [facility] for tasks assessed/performed       Past Medical History:  Diagnosis Date  . Abnormal Pap smear    ASCUS 03/13/11  . Anemia   . Breast pain, left   . Cancer ([place])   . Candida vaginitis   . Complication of anesthesia    pt states that all narcotics make her angry and she would prefer to avoid them.  . Rectal itching   . Vaginal Pap smear, abnormal     Past Surgical History:  Procedure Laterality Date  . BREAST BIOPSY Right 8/16  . CESAREAN SECTION N/A 11/21/2013   Procedure: CESAREAN SECTION;  Surgeon: Alwyn Pea, MD;  Location: [facility];  Service: Obstetrics;  Laterality: N/A;  . CESAREAN SECTION N/A 06/23/2016   Procedure: CESAREAN SECTION;  Surgeon: Emily Filbert, MD;  Location: [place];  Service: Obstetrics;  Laterality: N/A;  . HAND SURGERY  04/2012   ligament repair  . MASTECTOMY WITH AXILLARY LYMPH NODE DISSECTION Bilateral 11/14/2018   Procedure: RIGHT MASTECTOMY WITH RIGHT AXILLARY LYMPH NODE DISSECTION AND LEFT RISK REDUCING MASTECTOMY;  Surgeon: Rolm Bookbinder, MD;  Location: [place];  Service: General;  Laterality: Bilateral;  LATE ENTRY: Corrected laterality documentation to read right axillary lymph node dissection vs. documented laterality of left.    Marland Kitchen PILONIDAL CYST EXCISION  2000   . PORTACATH PLACEMENT N/A 05/28/2018   Procedure: INSERTION PORT-A-CATH WITH ULTRASOUND;  Surgeon: Rolm Bookbinder, MD;  Location: [place];  Service: General;  Laterality: N/A;  . WISDOM TOOTH EXTRACTION  AGE 70    There were no vitals filed for this visit.  Subjective Assessment - 04/24/19 0854    Subjective  The scan came back negative. Darlene Grant is doing MLD for the swelling in her neck.    Pertinent History  breast cancer diagnosed November 2019: triple negative with Ki of 75% She completed chemotherapy 10/18/2018. 11/14/2018: she underwent bilateral mastectomy with 0/13 nodes positive on the right; radiation ended on 02/10/2019    Patient Stated Goals  sleep without pain, exercise without discomfort, carry daughter on left due to radiation on the right breast    Currently in Pain?  Yes    Pain Score  2     Pain Location  Shoulder    Pain Orientation  Left    Pain Descriptors / Indicators  Aching    Pain Type  Chronic pain    Pain Onset  More than a month ago    Pain Frequency  Intermittent    Aggravating Factors   bring left arm behind her, bring left arm overhead, taking shirt off    Pain Relieving Factors  arm at side    Multiple Pain Sites  Yes  Pain Score  1    Pain Location  Hip    Pain Orientation  Left    Pain Descriptors / Indicators  Aching;Sore;Tightness    Pain Type  Chronic pain    Pain Onset  More than a month ago    Pain Frequency  Constant    Aggravating Factors   picking up daughter, being on the nustep,    Pain Relieving Factors  yoga, movement continuously,         [facility] PT Assessment - 04/24/19 0001      Assessment   Medical Diagnosis  M25.552 left hip pain; C50.411,Z17.1 Malignant neoplasm of upper-outer quadrant of right breast in female, estrogen receptor negative    Referring Provider (PT)  Dr. Nicholas Lose; Dr. Clovia Cuff    Onset Date/Surgical Date  06/23/16    Hand Dominance  Right    Prior Therapy  none      Precautions    Precautions  Other (comment)    Precaution Comments  recent chemo      Restrictions   Weight Bearing Restrictions  No      Balance Screen   Has the patient fallen in the past 6 months  No    Has the patient had a decrease in activity level because of a fear of falling?   No    Is the patient reluctant to leave their home because of a fear of falling?   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     Leisure  likes to walk and exercise regularly, well educated in exercise       Cognition   Overall Cognitive Status  Within Functional Limits for tasks assessed      Posture/Postural Control   Posture/Postural Control  No significant limitations      ROM / Strength   AROM / PROM / Strength  AROM;PROM;Strength      AROM   Overall AROM Comments  lumbar ROM is full    Left Shoulder Extension  65 Degrees    Left Shoulder Flexion  160 Degrees    Left Shoulder ABduction  170 Degrees    Left Shoulder Internal Rotation  --   reach to the T7 with left   Lumbar Extension  full    Lumbar - Right Side Bend  full      PROM   Overall PROM Comments  full motion of left hip      Strength   Right Hip Internal Rotation  5/5    Left Hip Flexion  4+/5    Left Hip Extension  4+/5    Left Hip Internal Rotation  5/5    Left Hip ABduction  4+/5      Palpation   SI assessment   left ilium is rotated posteriorly    Palpation comment  tenderness located in left inguinal area                   [facility] Adult PT Treatment/Exercise - 04/24/19 0001      Knee/Hip Exercises: Aerobic   Nustep  level 3 just legs for while assessing patient for 14 minutes  Shoulder Exercises: Supine   Other Supine Exercises  supine with left shoulder extended and  therapist place fingers along the scapula to mobilize from the thoracic spine with retraction      Shoulder Exercises: Stretch   Other Shoulder Stretches  thread the needle stretch to bil. sides for interscapular stretch    Other Shoulder Stretches  lay on right side and bring left arm forward and back with therapist guiding the rib cage and stretch the pectoralis then did to right       Manual Therapy   Manual Therapy  Soft tissue mobilization;Joint mobilization;Muscle Energy Technique    Joint Mobilization  sideglide to T12-L5 from left to right grade III    Soft tissue mobilization  anterior left shoulder with extension ; lumbar paraspinals, quadratus lumborum, left gluteus medius and minimus    Muscle Energy Technique  correct left ilium       Trigger Point Dry Needling - 04/24/19 0001    Consent Given?  Yes    Education Handout Provided  Previously provided    Muscles Treated Back/Hip  Lumbar multifidi;Quadratus lumborum;Gluteus medius;Gluteus minimus   left   Gluteus Minimus Response  Twitch response elicited;Palpable increased muscle length    Gluteus Medius Response  Twitch response elicited;Palpable increased muscle length    Lumbar multifidi Response  Twitch response elicited;Palpable increased muscle length    Quadratus Lumborum Response  Twitch response elicited;Palpable increased muscle length             PT Short Term Goals - 03/31/19 1246      PT SHORT TERM GOAL #1   Title  independent with initial HEP for left hip    Time  4    Period  Weeks    Status  Achieved      PT SHORT TERM GOAL #2   Title  understand how to take care of vaginal health to reduce dryness and keep tissue mobility    Period  Weeks    Status  Achieved        PT Long Term Goals - 04/24/19 1000      PT LONG TERM GOAL #5   Title  Patient able to ambulate without left hip pain due to pelvis in correct alignment    Baseline  hip pain improved by 80%, pelvis not able in correct  alignment      PT LONG TERM GOAL #6   Title  able to lay on left hip for sleeping with minimal to no pain    Baseline  90% better    Time  12    Period  Weeks    Status  On-going      PT LONG TERM GOAL #7   Title  able to hold her child on the left hip due to the radiation soreness on the right breast with pain minimal to none    Baseline  90% better    Time  12    Period  Weeks    Status  On-going            Plan - 04/24/19 1013    Clinical Impression Statement  Patient has difficulty with reaching back with the left arm due to tightness in the anterior left shoulder. Patient has difficulty with taking off her pullover top due to pain in anterior left shoulder. Patient has tightness in the interscapula area and restrictions of the left scapula. Patient left ilium is rotated posteriorly. Patient had decreased mobility of T12-L5.  Tightness in the left lumbar multifidi, quadratus and gluteus minimus and medius. Patient pelvis was in correct alignment after therapy. Patient overall pain has decreased by 85%. Patient had some tightness in the mid thoracic area. Patient is not having issues with intercourse and using creams for vaginal dryness. Patient would benefit from skilled therapy to imporve her function as we improve her mobilty of the left hip and shoulder.    Personal Factors and Comorbidities  Comorbidity 1;Sex    Comorbidities  recent chemo and radiation    Examination-Activity Limitations  Bathing;Reach Overhead;Caring for Others;Lift;Hygiene/Grooming;Locomotion Level;Sleep    Stability/Clinical Decision Making  Evolving/Moderate complexity    Rehab Potential  Good    PT Frequency  1x / week    PT Duration  8 weeks    PT Treatment/Interventions  ADLs/Self Care Home Management;Therapeutic exercise;Manual techniques;Biofeedback;Electrical Stimulation;Therapeutic activities;Neuromuscular re-education;Patient/family education;Dry needling;Passive range of motion;Spinal  Manipulations;Manual lymph drainage;Taping;Compression bandaging;Orthotic Fit/Training;Iontophoresis 4mg /ml Dexamethasone    PT Next Visit Plan  work on shoulder ROM, hip and core strength; dry needling, for ortho, manual lymph drainage and reassess goals and add closed chain UE exercise for lymph    PT Home Exercise Plan  Access Code: [email or web address]    Recommended Other Services  sent renewal on 04/24/2019    Consulted and Agree with Plan of Care  Patient       Patient will benefit from skilled therapeutic intervention in order to improve the following deficits and impairments:  Decreased knowledge of precautions, Decreased knowledge of use of DME, Postural dysfunction, Decreased range of motion, Increased fascial restricitons, Impaired UE functional use, Increased muscle spasms, Impaired perceived functional ability, Pain, Decreased scar mobility, Decreased strength, Increased edema, Difficulty walking, Decreased activity tolerance, Decreased mobility  Visit Diagnosis: Aftercare following surgery for neoplasm - Plan: PT plan of care cert/re-cert, PT plan of care cert/re-cert  Muscle weakness (generalized) - Plan: PT plan of care cert/re-cert, PT plan of care cert/re-cert  Pain in left hip - Plan: PT plan of care cert/re-cert, PT plan of care cert/re-cert  Stiffness of left shoulder, not elsewhere classified - Plan: PT plan of care cert/re-cert, PT plan of care cert/re-cert  Stiffness of right shoulder joint - Plan: PT plan of care cert/re-cert, PT plan of care cert/re-cert     Problem List Patient Active Problem List   Diagnosis Date Noted  . History of right breast cancer 11/14/2018  . Chemotherapy induced neutropenia ([place]) 09/13/2018  . Port-A-Cath in place 06/10/2018  . Malignant neoplasm of upper-outer quadrant of right breast in female, estrogen receptor negative ([place]) 05/09/2018  . Low iron stores 01/16/2018  . Muscle spasm 01/15/2018  . Piriformis syndrome of left side 01/15/2018   . Grade I hemorrhoids 02/23/2017  . Low lying posterior placenta, antepartum 02/01/2016  . AMA (advanced maternal age) multigravida 35+ 11/22/2015  . 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 Calcification of right breast 02/19/2015  . Ligament tear 07/05/2012    Earlie Counts, PT 04/24/19 10:24 AM   [facility] [address], [place], [place], [address] Phone: [phone]   Fax:  [phone]  Name: Darlene Grant MRN: [id number] Date of Birth: 1976-05-30

## 2019-04-28 ENCOUNTER — Other Ambulatory Visit: Payer: Self-pay

## 2019-04-28 ENCOUNTER — Ambulatory Visit: Payer: BC Managed Care – PPO | Admitting: Physical Therapy

## 2019-04-28 ENCOUNTER — Encounter: Payer: Self-pay | Admitting: Physical Therapy

## 2019-04-28 DIAGNOSIS — Z483 Aftercare following surgery for neoplasm: Secondary | ICD-10-CM

## 2019-04-28 DIAGNOSIS — M25511 Pain in right shoulder: Secondary | ICD-10-CM | POA: Diagnosis not present

## 2019-04-28 DIAGNOSIS — Z171 Estrogen receptor negative status [ER-]: Secondary | ICD-10-CM

## 2019-04-28 DIAGNOSIS — R293 Abnormal posture: Secondary | ICD-10-CM | POA: Diagnosis not present

## 2019-04-28 DIAGNOSIS — M25611 Stiffness of right shoulder, not elsewhere classified: Secondary | ICD-10-CM | POA: Diagnosis not present

## 2019-04-28 DIAGNOSIS — M6281 Muscle weakness (generalized): Secondary | ICD-10-CM | POA: Diagnosis not present

## 2019-04-28 DIAGNOSIS — I972 Postmastectomy lymphedema syndrome: Secondary | ICD-10-CM | POA: Diagnosis not present

## 2019-04-28 DIAGNOSIS — L599 Disorder of the skin and subcutaneous tissue related to radiation, unspecified: Secondary | ICD-10-CM

## 2019-04-28 DIAGNOSIS — C50411 Malignant neoplasm of upper-outer quadrant of right female breast: Secondary | ICD-10-CM

## 2019-04-28 NOTE — Progress Notes (Signed)
Left VM for patient to schedule a counseling appointment.  Also sent pt an e-mail with a list of available times and dates.  Art Buff [place] Counseling Intern Voicemail:  [phone]

## 2019-04-28 NOTE — Progress Notes (Signed)
RN successfully faxed lab orders to [phone], [place] per patient request.

## 2019-04-28 NOTE — Therapy (Signed)
[place], [place], [address] Phone: [phone]   Fax:  [phone]  Physical Therapy Treatment  Patient Details  Name: Darlene Grant MRN: [id number] Date of Birth: 11-03-1975 Referring Provider (PT): Dr. Nicholas Lose; Dr. Clovia Cuff   Encounter Date: 04/28/2019  PT End of Session - 04/28/19 1210    Visit Number  29    Number of Visits  45    Date for PT Re-Evaluation  06/19/19    PT Start Time  1000    PT Stop Time  1055    PT Time Calculation (min)  55 min    Activity Tolerance  Patient tolerated treatment well    Behavior During Therapy  [facility] for tasks assessed/performed       Past Medical History:  Diagnosis Date  . Abnormal Pap smear    ASCUS 03/13/11  . Anemia   . Breast pain, left   . Cancer ([place])   . Candida vaginitis   . Complication of anesthesia    pt states that all narcotics make her angry and she would prefer to avoid them.  . Rectal itching   . Vaginal Pap smear, abnormal     Past Surgical History:  Procedure Laterality Date  . BREAST BIOPSY Right 8/16  . CESAREAN SECTION N/A 11/21/2013   Procedure: CESAREAN SECTION;  Surgeon: Alwyn Pea, MD;  Location: [facility];  Service: Obstetrics;  Laterality: N/A;  . CESAREAN SECTION N/A 06/23/2016   Procedure: CESAREAN SECTION;  Surgeon: Emily Filbert, MD;  Location: [facility];  Service: Obstetrics;  Laterality: N/A;  . HAND SURGERY  04/2012   ligament repair  . MASTECTOMY WITH AXILLARY LYMPH NODE DISSECTION Bilateral 11/14/2018   Procedure: RIGHT MASTECTOMY WITH RIGHT AXILLARY LYMPH NODE DISSECTION AND LEFT RISK REDUCING MASTECTOMY;  Surgeon: Rolm Bookbinder, MD;  Location: [facility];  Service: General;  Laterality: Bilateral;  LATE ENTRY: Corrected laterality documentation to read right axillary lymph node dissection vs. documented laterality of left.    Marland Kitchen PILONIDAL CYST EXCISION  2000  .  PORTACATH PLACEMENT N/A 05/28/2018   Procedure: INSERTION PORT-A-CATH WITH ULTRASOUND;  Surgeon: Rolm Bookbinder, MD;  Location: [facility];  Service: General;  Laterality: N/A;  . WISDOM TOOTH EXTRACTION  AGE 42    There were no vitals filed for this visit.  Subjective Assessment - 04/28/19 1152    Subjective  Pt felt relief after the rib mobilizations that [place] did last session. She was able to get outside this weekend with her kids and do some run/walking    Pertinent History  breast cancer diagnosed November 2019: triple negative with Ki of 75% She completed chemotherapy 10/18/2018. 11/14/2018: she underwent bilateral mastectomy with 0/13 nodes positive on the right; radiation ended on 02/10/2019    Patient Stated Goals  sleep without pain, exercise without discomfort, carry daughter on left due to radiation on the right breast    Currently in Pain?  No/denies   some pain in right lower ribs over the weekend        [facility] PT Assessment - 04/28/19 0001      Assessment   Medical Diagnosis  M25.552 left hip pain; C50.411,Z17.1 Malignant neoplasm of upper-outer quadrant of right breast in female, estrogen receptor negative    Referring Provider (PT)  Dr. Nicholas Lose; Dr. Clovia Cuff    Onset Date/Surgical Date  06/23/16   bilateral mastecomies 11/14/2018     Prior  Function   Level of Independence  Independent                   [facility] Adult PT Treatment/Exercise - 04/28/19 0001      Shoulder Exercises: Stretch   Other Shoulder Stretches  thread the needle stretch to bil. sides for interscapular stretch    Other Shoulder Stretches  from quaduped, arm across chest  for backward thoracic rotation stretch       Manual Therapy   Manual therapy comments  vibratory stretch to right lower intercostals with exhalation. Pt felt relief     Manual Lymphatic Drainage (MLD)  in supine for short neck and adominal scooping, right inguinal nodes and right axillo-inguinal  anastamosis, left axillary nodes and anterior interaxillary anastamosis and chest, then to left sidelying for  posterior interaxillay anastamosis being careful not to overstretch radiated skin  Extra time spent on anterior chest and neck with skin stretch toward posterior neck                PT Short Term Goals - 03/31/19 1246      PT SHORT TERM GOAL #1   Title  independent with initial HEP for left hip    Time  4    Period  Weeks    Status  Achieved      PT SHORT TERM GOAL #2   Title  understand how to take care of vaginal health to reduce dryness and keep tissue mobility    Period  Weeks    Status  Achieved        PT Long Term Goals - 04/28/19 1207      PT LONG TERM GOAL #8   Title  Pt will report that the discomfort in her right axilla and radiation area is decreased by 50% and that she will be able to manage it at home    Time  8    Period  Weeks    Status  On-going            Plan - 04/28/19 1203    Clinical Impression Statement  Pt continues to struggle with tightness and fullness in left upper quadrant especilly in pec area, axilla and ribs.  Her CT scan showed increase fluid in left axillary and subclavian area. She will benefit from continued manual lymph drainage to help with these  symptoms    Personal Factors and Comorbidities  Other    Comorbidities  recent chemo and radiation    Examination-Activity Limitations  Bathing;Reach Overhead;Caring for Others;Lift;Hygiene/Grooming;Locomotion Level;Sleep    Stability/Clinical Decision Making  Evolving/Moderate complexity    Clinical Decision Making  Moderate    Rehab Potential  Good    PT Frequency  1x / week    PT Duration  8 weeks    PT Treatment/Interventions  ADLs/Self Care Home Management;Therapeutic exercise;Manual techniques;Biofeedback;Electrical Stimulation;Therapeutic activities;Neuromuscular re-education;Patient/family education;Dry needling;Passive range of motion;Spinal Manipulations;Manual  lymph drainage;Taping;Compression bandaging;Orthotic Fit/Training;Iontophoresis 4mg /ml Dexamethasone    PT Next Visit Plan  work on shoulder ROM, hip and core strength; dry needling, for ortho, manual lymph drainage, soft tissue work   and add closed chain UE exercise for lymph    Recommended Other Services  sent renewal for lymph on 04/28/2019 to coincide with dates for ortho recert., visits added for count    Consulted and Agree with Plan of Care  Patient       Patient will benefit from skilled therapeutic intervention in order to improve the following deficits and  impairments:  Decreased knowledge of precautions, Decreased knowledge of use of DME, Postural dysfunction, Decreased range of motion, Increased fascial restricitons, Impaired UE functional use, Increased muscle spasms, Impaired perceived functional ability, Pain, Decreased scar mobility, Decreased strength, Increased edema, Difficulty walking, Decreased activity tolerance, Decreased mobility  Visit Diagnosis: Aftercare following surgery for neoplasm - Plan: PT plan of care cert/re-cert  Muscle weakness (generalized) - Plan: PT plan of care cert/re-cert  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 History of right breast cancer 11/14/2018  . Chemotherapy induced neutropenia ([place]) 09/13/2018  . Port-A-Cath in place 06/10/2018  . Malignant neoplasm of upper-outer quadrant of right breast in female, estrogen receptor negative ([place]) 05/09/2018  . Low iron stores 01/16/2018  . Muscle spasm 01/15/2018  . Piriformis syndrome of left side 01/15/2018  . Grade I hemorrhoids 02/23/2017  . Low lying posterior placenta, antepartum 02/01/2016  . AMA (advanced maternal age) multigravida 35+ 11/22/2015  . Fibroadenoma of right breast  04/05/2015  . Calcification of right breast 02/19/2015  . Ligament tear 07/05/2012   Donato Heinz. Owens Shark PT  Norwood Levo 04/28/2019, 12:13 PM  [place], [place], [address] Phone: [phone]   Fax:  [phone]  Name: Darlene Grant MRN: [id number] Date of Birth: [birth year]

## 2019-05-01 ENCOUNTER — Encounter: Payer: Self-pay | Admitting: Hematology and Oncology

## 2019-05-01 ENCOUNTER — Other Ambulatory Visit: Payer: Self-pay

## 2019-05-01 ENCOUNTER — Ambulatory Visit: Payer: BC Managed Care – PPO | Admitting: Physical Therapy

## 2019-05-01 ENCOUNTER — Encounter: Payer: BC Managed Care – PPO | Admitting: Physical Therapy

## 2019-05-01 ENCOUNTER — Encounter: Payer: Self-pay | Admitting: Physical Therapy

## 2019-05-01 DIAGNOSIS — M25552 Pain in left hip: Secondary | ICD-10-CM | POA: Diagnosis not present

## 2019-05-01 DIAGNOSIS — M6281 Muscle weakness (generalized): Secondary | ICD-10-CM | POA: Diagnosis not present

## 2019-05-01 DIAGNOSIS — M25612 Stiffness of left shoulder, not elsewhere classified: Secondary | ICD-10-CM | POA: Diagnosis not present

## 2019-05-01 DIAGNOSIS — Z483 Aftercare following surgery for neoplasm: Secondary | ICD-10-CM | POA: Diagnosis not present

## 2019-05-01 DIAGNOSIS — M25611 Stiffness of right shoulder, not elsewhere classified: Secondary | ICD-10-CM | POA: Diagnosis not present

## 2019-05-01 NOTE — Therapy (Signed)
[facility] [address] [place], [place], [address] Phone: [phone]   Fax:  [phone]  Physical Therapy Treatment  Patient Details  Name: Darlene Grant MRN: [id number] Date of Birth: 1975/07/05 Referring Provider (PT): Dr. Nicholas Lose; Dr. Clovia Cuff   Encounter Date: 05/01/2019  PT End of Session - 05/01/19 0953    Visit Number  30    Date for PT Re-Evaluation  06/19/19    Authorization Type  [employer]    PT Start Time  [id number]    PT Stop Time  1016    PT Time Calculation (min)  38 min    Activity Tolerance  Patient tolerated treatment well;No increased pain    Behavior During Therapy  W[facility] for tasks assessed/performed       Past Medical History:  Diagnosis Date  . Abnormal Pap smear    ASCUS 03/13/11  . Anemia   . Breast pain, left   . Cancer ([place])   . Candida vaginitis   . Complication of anesthesia    pt states that all narcotics make her angry and she would prefer to avoid them.  . Rectal itching   . Vaginal Pap smear, abnormal     Past Surgical History:  Procedure Laterality Date  . BREAST BIOPSY Right 8/16  . CESAREAN SECTION N/A 11/21/2013   Procedure: CESAREAN SECTION;  Surgeon: Alwyn Pea, MD;  Location: [facility];  Service: Obstetrics;  Laterality: N/A;  . CESAREAN SECTION N/A 06/23/2016   Procedure: CESAREAN SECTION;  Surgeon: Emily Filbert, MD;  Location: [facility];  Service: Obstetrics;  Laterality: N/A;  . HAND SURGERY  04/2012   ligament repair  . MASTECTOMY WITH AXILLARY LYMPH NODE DISSECTION Bilateral 11/14/2018   Procedure: RIGHT MASTECTOMY WITH RIGHT AXILLARY LYMPH NODE DISSECTION AND LEFT RISK REDUCING MASTECTOMY;  Surgeon: Rolm Bookbinder, MD;  Location: [place];  Service: General;  Laterality: Bilateral;  LATE ENTRY: Corrected laterality documentation to read right axillary lymph node dissection vs. documented laterality of left.    Marland Kitchen PILONIDAL  CYST EXCISION  2000  . PORTACATH PLACEMENT N/A 05/28/2018   Procedure: INSERTION PORT-A-CATH WITH ULTRASOUND;  Surgeon: Rolm Bookbinder, MD;  Location: [place];  Service: General;  Laterality: N/A;  . WISDOM TOOTH EXTRACTION  AGE 44    There were no vitals filed for this visit.  Subjective Assessment - 05/01/19 0942    Subjective  I am sore from the rib mobilization. The left shoulder is doing better. I have been doing more weightbearing exercise on the left shoulder.    Pertinent History  breast cancer diagnosed November 2019: triple negative with Ki of 75% She completed chemotherapy 10/18/2018. 11/14/2018: she underwent bilateral mastectomy with 0/13 nodes positive on the right; radiation ended on 02/10/2019    Patient Stated Goals  sleep without pain, exercise without discomfort, carry daughter on left due to radiation on the right breast    Currently in Pain?  Yes    Pain Score  1     Pain Location  Shoulder    Pain Orientation  Left    Pain Descriptors / Indicators  Aching    Pain Type  Chronic pain    Pain Onset  More than a month ago    Pain Frequency  Intermittent    Aggravating Factors   bring left arm behind her, bring left arm overhead, taking shirt off    Pain Relieving Factors  arm  at side    Effect of Pain on Daily Activities  reaching overhead    Multiple Pain Sites  Yes    Pain Score  2    Pain Location  Hip    Pain Orientation  Left    Pain Descriptors / Indicators  Aching;Sore;Tightness    Pain Type  Chronic pain    Pain Onset  More than a month ago    Pain Frequency  Constant    Aggravating Factors   picking up daughter, being on the nustep    Pain Relieving Factors  yoga, movement continuously                       [facility] Adult PT Treatment/Exercise - 05/01/19 0001      Knee/Hip Exercises: Aerobic   Nustep  level 3 just legs for while assessing patient for 7 minutes      Shoulder Exercises: Sidelying   Other Sidelying  Exercises  stretch the left quadratus in left sidely and leaning on the left shoulder    Other Sidelying Exercises  side plank with lifting up hips to work the left shoulder and left obliques; A/AROM of left ilium with diagonals and up and down      Manual Therapy   Manual Therapy  Soft tissue mobilization;Myofascial release    Soft tissue mobilization  left diaphgram, left quadratus, left iliopsoas    Myofascial Release  tissue rolling of the skin off the muscles on the left side       Trigger Point Dry Needling - 05/01/19 0001    Consent Given?  Yes    Education Handout Provided  Previously provided    Muscles Treated Back/Hip  Iliopsoas;Quadratus lumborum   left   Iliopsoas Response  Twitch response elicited;Palpable increased muscle length    Quadratus Lumborum Response  Twitch response elicited;Palpable increased muscle length             PT Short Term Goals - 03/31/19 1246      PT SHORT TERM GOAL #1   Title  independent with initial HEP for left hip    Time  4    Period  Weeks    Status  Achieved      PT SHORT TERM GOAL #2   Title  understand how to take care of vaginal health to reduce dryness and keep tissue mobility    Period  Weeks    Status  Achieved        PT Long Term Goals - 04/28/19 1207      PT LONG TERM GOAL #8   Title  Pt will report that the discomfort in her right axilla and radiation area is decreased by 50% and that she will be able to manage it at home    Time  8    Period  Weeks    Status  On-going            Plan - 05/01/19 0949    Clinical Impression Statement  Patient had a trigger point in the left quadratus and iliopsoas. Patient had less pain after manual therapy. Patient is able to reach better with her left shoulder. Patient may benefit from iontophoresis to anterior left shoulder. Patient is exercising her hips better and increased her activity. Patient will benefit from skilled therapy to improve function and decrease pain.     Personal Factors and Comorbidities  Other    Comorbidities  recent chemo and radiation  Examination-Activity Limitations  Bathing;Reach Overhead;Caring for Others;Lift;Hygiene/Grooming;Locomotion Level;Sleep    Stability/Clinical Decision Making  Evolving/Moderate complexity    Rehab Potential  Good    PT Frequency  1x / week    PT Duration  8 weeks    PT Treatment/Interventions  ADLs/Self Care Home Management;Therapeutic exercise;Manual techniques;Biofeedback;Electrical Stimulation;Therapeutic activities;Neuromuscular re-education;Patient/family education;Dry needling;Passive range of motion;Spinal Manipulations;Manual lymph drainage;Taping;Compression bandaging;Orthotic Fit/Training;Iontophoresis 4mg /ml Dexamethasone    PT Next Visit Plan  work on shoulder ROM, hip and core strength; dry needling, for ortho, manual lymph drainage, soft tissue work   and add closed chain UE exercise for lymph; iont left shoulder    PT Home Exercise Plan  Access Code: [email or web address]    Recommended Other Services  MD signed othro renewal    Consulted and Agree with Plan of Care  Patient       Patient will benefit from skilled therapeutic intervention in order to improve the following deficits and impairments:  Decreased knowledge of precautions, Decreased knowledge of use of DME, Postural dysfunction, Decreased range of motion, Increased fascial restricitons, Impaired UE functional use, Increased muscle spasms, Impaired perceived functional ability, Pain, Decreased scar mobility, Decreased strength, Increased edema, Difficulty walking, Decreased activity tolerance, Decreased mobility  Visit Diagnosis: Aftercare following surgery for neoplasm  Muscle weakness (generalized)  Stiffness of left shoulder, not elsewhere classified  Pain in left hip     Problem List Patient Active Problem List   Diagnosis Date Noted  . History of right breast cancer 11/14/2018  . Chemotherapy induced neutropenia ([place])  09/13/2018  . Port-A-Cath in place 06/10/2018  . Malignant neoplasm of upper-outer quadrant of right breast in female, estrogen receptor negative ([place]) 05/09/2018  . Low iron stores 01/16/2018  . Muscle spasm 01/15/2018  . Piriformis syndrome of left side 01/15/2018  . Grade I hemorrhoids 02/23/2017  . Low lying posterior placenta, antepartum 02/01/2016  . AMA (advanced maternal age) multigravida 35+ 11/22/2015  . 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 Calcification of right breast 02/19/2015  . Ligament tear 07/05/2012    Earlie Counts, PT 05/01/19 10:19 AM   [facility] [address] [place], [place], [address] Phone: [phone]   Fax:  [phone]  Name: Darlene Grant MRN: [id number] Date of Birth: 04/02/1976

## 2019-05-05 ENCOUNTER — Ambulatory Visit: Payer: BC Managed Care – PPO

## 2019-05-05 ENCOUNTER — Encounter: Payer: Self-pay | Admitting: General Practice

## 2019-05-05 NOTE — Progress Notes (Signed)
[facility] Spiritual Care Note  Because it's so hard to talk on the phone with small children at home, I sent Gerrianne a handwritten card of encouragement.   [place], [place], [facility] Pager [phone] Voicemail [phone]

## 2019-05-06 ENCOUNTER — Other Ambulatory Visit: Payer: Self-pay | Admitting: *Deleted

## 2019-05-06 ENCOUNTER — Ambulatory Visit (INDEPENDENT_AMBULATORY_CARE_PROVIDER_SITE_OTHER): Payer: BC Managed Care – PPO

## 2019-05-06 ENCOUNTER — Other Ambulatory Visit: Payer: Self-pay

## 2019-05-06 ENCOUNTER — Telehealth: Payer: Self-pay | Admitting: Hematology and Oncology

## 2019-05-06 DIAGNOSIS — C50919 Malignant neoplasm of unspecified site of unspecified female breast: Secondary | ICD-10-CM | POA: Diagnosis not present

## 2019-05-06 DIAGNOSIS — Z171 Estrogen receptor negative status [ER-]: Secondary | ICD-10-CM | POA: Diagnosis not present

## 2019-05-06 DIAGNOSIS — C50411 Malignant neoplasm of upper-outer quadrant of right female breast: Secondary | ICD-10-CM

## 2019-05-06 MED ORDER — IOHEXOL 300 MG/ML  SOLN
100.0000 mL | Freq: Once | INTRAMUSCULAR | Status: AC | PRN
  Administered 2019-05-06: 09:00:00 100 mL via INTRAVENOUS

## 2019-05-06 NOTE — Telephone Encounter (Signed)
I left a voicemail for the patient that the CT chest abdomen pelvis did not show any evidence of distant metastatic disease.  There was thickening and soft tissue irregularity under the axilla and I would like to get an ultrasound and biopsy if necessary.  I will request her navigators to help me with setting up these.

## 2019-05-08 ENCOUNTER — Encounter: Payer: BC Managed Care – PPO | Admitting: Physical Therapy

## 2019-05-08 ENCOUNTER — Encounter: Payer: Self-pay | Admitting: Hematology and Oncology

## 2019-05-12 ENCOUNTER — Encounter: Payer: Self-pay | Admitting: Hematology and Oncology

## 2019-05-13 ENCOUNTER — Ambulatory Visit: Payer: BC Managed Care – PPO

## 2019-05-14 ENCOUNTER — Ambulatory Visit
Admission: RE | Admit: 2019-05-14 | Discharge: 2019-05-14 | Disposition: A | Discharge status: Home / Self Care | Payer: BC Managed Care – PPO | Source: Ambulatory Visit | Attending: Hematology and Oncology | Admitting: Hematology and Oncology

## 2019-05-14 ENCOUNTER — Encounter: Payer: BC Managed Care – PPO | Admitting: Physical Therapy

## 2019-05-14 ENCOUNTER — Other Ambulatory Visit: Payer: Self-pay

## 2019-05-14 DIAGNOSIS — R590 Localized enlarged lymph nodes: Secondary | ICD-10-CM | POA: Diagnosis not present

## 2019-05-14 DIAGNOSIS — Z171 Estrogen receptor negative status [ER-]: Secondary | ICD-10-CM

## 2019-05-14 DIAGNOSIS — C50411 Malignant neoplasm of upper-outer quadrant of right female breast: Secondary | ICD-10-CM

## 2019-05-14 DIAGNOSIS — Z853 Personal history of malignant neoplasm of breast: Secondary | ICD-10-CM | POA: Diagnosis not present

## 2019-05-14 DIAGNOSIS — M7989 Other specified soft tissue disorders: Secondary | ICD-10-CM | POA: Diagnosis not present

## 2019-05-14 DIAGNOSIS — N6489 Other specified disorders of breast: Secondary | ICD-10-CM | POA: Diagnosis not present

## 2019-05-19 ENCOUNTER — Encounter: Payer: BC Managed Care – PPO | Admitting: Physical Therapy

## 2019-05-20 ENCOUNTER — Other Ambulatory Visit: Payer: Self-pay

## 2019-05-20 ENCOUNTER — Encounter (HOSPITAL_COMMUNITY)
Admission: RE | Admit: 2019-05-20 | Discharge: 2019-05-20 | Disposition: A | Discharge status: Home / Self Care | Payer: BC Managed Care – PPO | Source: Ambulatory Visit | Attending: Hematology and Oncology | Admitting: Hematology and Oncology

## 2019-05-20 ENCOUNTER — Inpatient Hospital Stay: Payer: BC Managed Care – PPO

## 2019-05-20 ENCOUNTER — Other Ambulatory Visit (HOSPITAL_COMMUNITY): Payer: BC Managed Care – PPO

## 2019-05-20 DIAGNOSIS — C50411 Malignant neoplasm of upper-outer quadrant of right female breast: Secondary | ICD-10-CM | POA: Diagnosis not present

## 2019-05-20 DIAGNOSIS — C50911 Malignant neoplasm of unspecified site of right female breast: Secondary | ICD-10-CM | POA: Diagnosis not present

## 2019-05-20 DIAGNOSIS — Z171 Estrogen receptor negative status [ER-]: Secondary | ICD-10-CM | POA: Insufficient documentation

## 2019-05-20 MED ORDER — TECHNETIUM TC 99M MEDRONATE IV KIT
20.3000 | PACK | Freq: Once | INTRAVENOUS | Status: AC
  Administered 2019-05-20: 10:00:00 20.3 via INTRAVENOUS

## 2019-05-20 NOTE — Progress Notes (Signed)
Patient Care Team: Nicholas Lose, MD as PCP - General (Hematology and Oncology) Rolm Bookbinder, MD as Consulting Physician (General Surgery) Magrinat, Virgie Dad, MD as Consulting Physician (Oncology) Gery Pray, MD as Consulting Physician (Radiation Oncology) Emily Filbert, MD as Consulting Physician (Obstetrics and Gynecology) Lavonna Monarch, MD as Consulting Physician (Dermatology) Mauro Kaufmann, RN as Oncology Nurse Navigator Rockwell Germany, RN as Oncology Nurse Navigator  DIAGNOSIS:    ICD-10-CM   1. Malignant neoplasm of upper-outer quadrant of right breast in female, estrogen receptor negative ([place])  C50.411    Z17.1     SUMMARY OF ONCOLOGIC HISTORY: Oncology History  Malignant neoplasm of upper-outer quadrant of right breast in female, estrogen receptor negative ([place])  05/03/2018 Initial Diagnosis   right breast upper outer quadrant and right axillary lymph node biopsy 05/03/2018 for a clinical T2 N2, stage IIIC invasive ductal carcinoma, triple negative, with an Ki-67 of 75%   06/03/2018 - 10/18/2018 Neo-Adjuvant Chemotherapy   Dose dense Adriamycin and Cytoxan followed by Taxol and carboplatin x12   11/14/2018 Surgery   Bilateral mastectomies: Left mastectomy: Fibrocystic changes, right mastectomy: Complete pathologic response no evidence of residual cancer, 0/13 lymph nodes negative   12/31/2018 -  Radiation Therapy   Adjuvant XRT     CHIEF COMPLIANT: Follow-up of triple negative right breast cancer to review scans  INTERVAL HISTORY: Darlene Grant is a 43 y.o. with above-mentioned history of triple negative right breast cancer treated with neoadjuvant chemotherapy, bilateral mastectomies, radiation, and who is currently on surveillance. CT CAP on 05/06/19 showed no evidence of distant metastatic disease and irregular soft tissue in the right axilla. [place] on 05/14/19 showed soft tissue in the right axilla measuring 3.3cm likely representing  post-surgical scarring. Right axilla biopsy on 05/14/19 showed no evidence of malignancy. She presents to the clinic today for follow-up and to review her scans.   REVIEW OF SYSTEMS:   Constitutional: Denies fevers, chills or abnormal weight loss Eyes: Denies blurriness of vision Ears, nose, mouth, throat, and face: Denies mucositis or sore throat Respiratory: Denies cough, dyspnea or wheezes Cardiovascular: Denies palpitation, chest discomfort Gastrointestinal: Denies nausea, heartburn or change in bowel habits Skin: Denies abnormal skin rashes Lymphatics: Denies new lymphadenopathy or easy bruising Neurological: Denies numbness, tingling or new weaknesses Behavioral/Psych: Mood is stable, no new changes  Extremities: No lower extremity edema Breast: denies any pain or lumps or nodules in either breasts All other systems were reviewed with the patient and are negative.  I have reviewed the past medical history, past surgical history, social history and family history with the patient and they are unchanged from previous note.  ALLERGIES:  is allergic to augmentin [amoxicillin-pot clavulanate]; hydrocodone; and keflex [cephalexin].  MEDICATIONS:  Current Outpatient Medications  Medication Sig Dispense Refill   B Complex-C (B-COMPLEX WITH VITAMIN C) tablet Take 1 tablet by mouth daily.     Calcium Acetate-Magnesium Carb 450-200 MG TABS Take by mouth.     cyclobenzaprine (FLEXERIL) 10 MG tablet Take 0.5-1 tablets (5-10 mg total) by mouth 3 (three) times daily as needed for muscle spasms. (Patient not taking: Reported on 01/27/2019) 30 tablet 1   fluticasone (FLONASE) 50 MCG/ACT nasal spray Place 2 sprays into both nostrils daily. (Patient not taking: Reported on 01/27/2019) 16 g 2   loratadine (CLARITIN) 10 MG tablet Take 10 mg by mouth daily as needed for allergies.     Multiple Vitamins-Minerals (MULTIVITAMIN WITH MINERALS) tablet Take 1 tablet by mouth daily.  Probiotic Product  (PROBIOTIC DAILY PO) Take 1 tablet by mouth.     No current facility-administered medications for this visit.     PHYSICAL EXAMINATION: ECOG PERFORMANCE STATUS: 1 - Symptomatic but completely ambulatory  Vitals:   05/21/19 0930  BP: 115/77  Pulse: 78  Resp: 18  Temp: 98 F (36.7 C)  SpO2: 100%   Filed Weights   05/21/19 0930  Weight: 148 lb 12.8 oz (67.5 kg)    GENERAL: alert, no distress and comfortable SKIN: skin color, texture, turgor are normal, no rashes or significant lesions EYES: normal, Conjunctiva are pink and non-injected, sclera clear OROPHARYNX: no exudate, no erythema and lips, buccal mucosa, and tongue normal  NECK: supple, thyroid normal size, non-tender, without nodularity LYMPH: no palpable lymphadenopathy in the cervical, axillary or inguinal LUNGS: clear to auscultation and percussion with normal breathing effort HEART: regular rate & rhythm and no murmurs and no lower extremity edema ABDOMEN: abdomen soft, non-tender and normal bowel sounds MUSCULOSKELETAL: no cyanosis of digits and no clubbing  NEURO: alert & oriented x 3 with fluent speech, no focal motor/sensory deficits EXTREMITIES: No lower extremity edema  LABORATORY DATA:  I have reviewed the data as listed CMP Latest Ref Rng & Units 01/22/2019 12/02/2018 10/29/2018  Glucose 70 - 99 mg/dL 93 89 94  BUN 6 - 20 mg/dL '13 9 13  '$ Creatinine 0.44 - 1.00 mg/dL 0.78 0.75 0.84  Sodium 135 - 145 mmol/L 139 141 140  Potassium 3.5 - 5.1 mmol/L 4.1 3.9 4.4  Chloride 98 - 111 mmol/L 103 104 105  CO2 22 - 32 mmol/L '26 24 27  '$ Calcium 8.9 - 10.3 mg/dL 10.0 10.0 9.6  Total Protein 6.5 - 8.1 g/dL 7.4 7.3 7.1  Total Bilirubin 0.3 - 1.2 mg/dL 1.6(H) 1.0 0.9  Alkaline Phos 38 - 126 U/L 70 72 68  AST 15 - 41 U/L 15 14(L) 14(L)  ALT 0 - 44 U/L '12 12 14    '$ Lab Results  Component Value Date   WBC 3.1 (L) 01/22/2019   HGB 12.1 01/22/2019   HCT 36.3 01/22/2019   MCV 100.8 (H) 01/22/2019   PLT 193 01/22/2019    NEUTROABS 2.2 01/22/2019    ASSESSMENT & PLAN:  Malignant neoplasm of upper-outer quadrant of right breast in female, estrogen receptor negative ([place]) 05/03/2018:right breast upper outer quadrant and right axillary lymph node biopsy 05/03/2018 for a clinical T2 N2, stage IIIC invasive ductal carcinoma,triple negative, with anKi-6775%, no deleterious genetic mutations  06/03/2018-10/18/2018: Neoadjuvant chemotherapy with dose dense Adriamycin and Cytoxan followed by Taxol and carboplatin  11/14/2018:Bilateral mastectomies: Left mastectomy: Fibrocystic changes, right mastectomy: Complete pathologic response no evidence of residual cancer, 0/13 lymph nodes negative  Completed adjuvant radiation therapy  Treatment plan: Surveillance November 2020: CT CAP, CTneck, CT head: No evidence of metastatic disease.  Soft tissue edema in the right axilla 05/14/2019: Biopsy right axilla: Fibrosis no malignancy. Bone scan 05/20/2019: No evidence of bone metastases.  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  The patient has a good understanding of the overall plan. she agrees with it. she will call with any problems that may develop before the next visit here.  Nicholas Lose, MD 05/21/2019  Julious Oka Dorshimer, am acting as scribe for Dr. Nicholas Lose.  I have reviewed the  above documentation for accuracy and completeness, and I agree with the above.

## 2019-05-21 ENCOUNTER — Encounter: Payer: Self-pay | Admitting: Physical Therapy

## 2019-05-21 ENCOUNTER — Ambulatory Visit: Payer: BC Managed Care – PPO | Attending: General Surgery | Admitting: Physical Therapy

## 2019-05-21 ENCOUNTER — Ambulatory Visit: Payer: BC Managed Care – PPO | Admitting: Physical Therapy

## 2019-05-21 ENCOUNTER — Telehealth: Payer: Self-pay | Admitting: Hematology and Oncology

## 2019-05-21 ENCOUNTER — Other Ambulatory Visit: Payer: Self-pay

## 2019-05-21 ENCOUNTER — Inpatient Hospital Stay: Payer: BC Managed Care – PPO | Attending: Hematology and Oncology | Admitting: Hematology and Oncology

## 2019-05-21 DIAGNOSIS — Z171 Estrogen receptor negative status [ER-]: Secondary | ICD-10-CM

## 2019-05-21 DIAGNOSIS — Z483 Aftercare following surgery for neoplasm: Secondary | ICD-10-CM

## 2019-05-21 DIAGNOSIS — M25511 Pain in right shoulder: Secondary | ICD-10-CM | POA: Diagnosis not present

## 2019-05-21 DIAGNOSIS — M6281 Muscle weakness (generalized): Secondary | ICD-10-CM

## 2019-05-21 DIAGNOSIS — Z853 Personal history of malignant neoplasm of breast: Secondary | ICD-10-CM | POA: Insufficient documentation

## 2019-05-21 DIAGNOSIS — R293 Abnormal posture: Secondary | ICD-10-CM | POA: Diagnosis not present

## 2019-05-21 DIAGNOSIS — M25612 Stiffness of left shoulder, not elsewhere classified: Secondary | ICD-10-CM | POA: Diagnosis not present

## 2019-05-21 DIAGNOSIS — L599 Disorder of the skin and subcutaneous tissue related to radiation, unspecified: Secondary | ICD-10-CM | POA: Insufficient documentation

## 2019-05-21 DIAGNOSIS — M25611 Stiffness of right shoulder, not elsewhere classified: Secondary | ICD-10-CM | POA: Insufficient documentation

## 2019-05-21 DIAGNOSIS — I972 Postmastectomy lymphedema syndrome: Secondary | ICD-10-CM | POA: Diagnosis not present

## 2019-05-21 DIAGNOSIS — C50411 Malignant neoplasm of upper-outer quadrant of right female breast: Secondary | ICD-10-CM

## 2019-05-21 NOTE — Telephone Encounter (Signed)
I talk with patient regarding schedule  

## 2019-05-21 NOTE — Therapy (Signed)
[place], [place], [address] Phone: [phone]   Fax:  [phone]  Physical Therapy Treatment  Patient Details  Name: Darlene Grant MRN: [id number] Date of Birth: April 09, 1976 Referring Provider (PT): Dr. Nicholas Lose; Dr. Clovia Cuff   Encounter Date: 05/21/2019  PT End of Session - 05/21/19 1211    Visit Number  31    Number of Visits  45    Date for PT Re-Evaluation  06/19/19    PT Start Time  1030    PT Stop Time  1105   pt arrived late   PT Time Calculation (min)  35 min    Activity Tolerance  Patient tolerated treatment well    Behavior During Therapy  [facility] for tasks assessed/performed       Past Medical History:  Diagnosis Date  . Abnormal Pap smear    ASCUS 03/13/11  . Anemia   . Breast pain, left   . Cancer ([place])   . Candida vaginitis   . Complication of anesthesia    pt states that all narcotics make her angry and she would prefer to avoid them.  . Rectal itching   . Vaginal Pap smear, abnormal     Past Surgical History:  Procedure Laterality Date  . BREAST BIOPSY Right 8/16  . CESAREAN SECTION N/A 11/21/2013   Procedure: CESAREAN SECTION;  Surgeon: Alwyn Pea, MD;  Location: [facility];  Service: Obstetrics;  Laterality: N/A;  . CESAREAN SECTION N/A 06/23/2016   Procedure: CESAREAN SECTION;  Surgeon: Emily Filbert, MD;  Location: [facility];  Service: Obstetrics;  Laterality: N/A;  . HAND SURGERY  04/2012   ligament repair  . MASTECTOMY WITH AXILLARY LYMPH NODE DISSECTION Bilateral 11/14/2018   Procedure: RIGHT MASTECTOMY WITH RIGHT AXILLARY LYMPH NODE DISSECTION AND LEFT RISK REDUCING MASTECTOMY;  Surgeon: Rolm Bookbinder, MD;  Location: [facility];  Service: General;  Laterality: Bilateral;  LATE ENTRY: Corrected laterality documentation to read right axillary lymph node dissection vs. documented laterality of left.    [place] Kitchen PILONIDAL CYST EXCISION   2000  . PORTACATH PLACEMENT N/A 05/28/2018   Procedure: INSERTION PORT-A-CATH WITH ULTRASOUND;  Surgeon: Rolm Bookbinder, MD;  Location: [place] [facility];  Service: General;  Laterality: N/A;  . WISDOM TOOTH EXTRACTION  AGE 43    There were no vitals filed for this visit.  Subjective Assessment - 05/21/19 1205    Subjective  Pt had an ultrasound and biopsy that revealed  scar tissue in right axilla. All other scans had good results also    Pertinent History  breast cancer diagnosed November 2019: triple negative with Ki of 75% She completed chemotherapy 10/18/2018. 11/14/2018: she underwent bilateral mastectomy with 0/13 nodes positive on the right; radiation ended on 02/10/2019    Currently in Pain?  Yes    Pain Score  --   did not rate.   Pain Location  Shoulder    Pain Orientation  Left    Pain Descriptors / Indicators  Aching    Pain Type  Chronic pain    Pain Onset  More than a month ago                       [facility] Adult PT Treatment/Exercise - 05/21/19 0001      Manual Therapy   Manual Therapy  Soft tissue mobilization    Soft tissue mobilization  Pt in right sidelying and with  thick massage creasm and intermittent use of a few minutes of moist heat, soft tissue work and prolonged pressure at trigger point in upper traps and mid scapular muscles     Scapular Mobilization  inferior glide of left scapula for upper trap stretch                PT Short Term Goals - 03/31/19 1246      PT SHORT TERM GOAL #1   Title  independent with initial HEP for left hip    Time  4    Period  Weeks    Status  Achieved      PT SHORT TERM GOAL #2   Title  understand how to take care of vaginal health to reduce dryness and keep tissue mobility    Period  Weeks    Status  Achieved        PT Long Term Goals - 04/28/19 1207      PT LONG TERM GOAL #8   Title  Pt will report that the discomfort in her right axilla and radiation area is decreased by 50% and  that she will be able to manage it at home    Time  8    Period  Weeks    Status  On-going            Plan - 05/21/19 1212    Clinical Impression Statement  Pt continues with trigger points and pain in left shoulder and neck that were lessened by soft tissue work today.  She reports improvment when she left.    Comorbidities  recent chemo and radiation    Examination-Activity Limitations  Bathing;Reach Overhead;Caring for Others;Lift;Hygiene/Grooming;Locomotion Level;Sleep    Stability/Clinical Decision Making  Evolving/Moderate complexity    PT Frequency  1x / week    PT Duration  8 weeks    PT Treatment/Interventions  ADLs/Self Care Home Management;Therapeutic exercise;Manual techniques;Biofeedback;Electrical Stimulation;Therapeutic activities;Neuromuscular re-education;Patient/family education;Dry needling;Passive range of motion;Spinal Manipulations;Manual lymph drainage;Taping;Compression bandaging;Orthotic Fit/Training;Iontophoresis 4mg /ml Dexamethasone    PT Next Visit Plan  work on shoulder ROM, hip and core strength; dry needling, for ortho, manual lymph drainage, soft tissue work   and add closed chain UE exercise for lymph; iont left shoulder    PT Home Exercise Plan  Access Code: [email or web address]    Consulted and Agree with Plan of Care  Patient       Patient will benefit from skilled therapeutic intervention in order to improve the following deficits and impairments:  Decreased knowledge of precautions, Decreased knowledge of use of DME, Postural dysfunction, Decreased range of motion, Increased fascial restricitons, Impaired UE functional use, Increased muscle spasms, Impaired perceived functional ability, Pain, Decreased scar mobility, Decreased strength, Increased edema, Difficulty walking, Decreased activity tolerance, Decreased mobility  Visit Diagnosis: Aftercare following surgery for neoplasm  Muscle weakness (generalized)  Stiffness of left shoulder, not elsewhere  classified     Problem List Patient Active Problem List   Diagnosis Date Noted  . History of right breast cancer 11/14/2018  . Chemotherapy induced neutropenia ([place]) 09/13/2018  . Port-A-Cath in place 06/10/2018  . Malignant neoplasm of upper-outer quadrant of right breast in female, estrogen receptor negative ([place]) 05/09/2018  . Low iron stores 01/16/2018  . Muscle spasm 01/15/2018  . Piriformis syndrome of left side 01/15/2018  . Grade I hemorrhoids 02/23/2017  . Low lying posterior placenta, antepartum 02/01/2016  . AMA (advanced maternal age) multigravida 35+ 11/22/2015  . 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 Calcification  of right breast 02/19/2015  . Ligament tear 07/05/2012   Donato Heinz. Owens Shark PT  Norwood Levo 05/21/2019, 12:15 PM  [place], [place], [address] Phone: [phone]   Fax:  [phone]  Name: Darlene Grant MRN: [id number] Date of Birth: 01-Feb-1976

## 2019-05-21 NOTE — Assessment & Plan Note (Signed)
05/03/2018:right breast upper outer quadrant and right axillary lymph node biopsy 05/03/2018 for a clinical T2 N2, stage IIIC invasive ductal carcinoma,triple negative, with anKi-6775%, no deleterious genetic mutations  06/03/2018-10/18/2018: Neoadjuvant chemotherapy with dose dense Adriamycin and Cytoxan followed by Taxol and carboplatin  11/14/2018:Bilateral mastectomies: Left mastectomy: Fibrocystic changes, right mastectomy: Complete pathologic response no evidence of residual cancer, 0/13 lymph nodes negative  Completed adjuvant radiation therapy  Treatment plan: Surveillance November 2020: CT CAP, CTneck, CT head: No evidence of metastatic disease.  Soft tissue edema in the right axilla 05/14/2019: Biopsy right axilla: Fibrosis no malignancy. Bone scan 05/20/2019: Pending  Patient was counseled about ABC clinical trial.  She will think about it next year. 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

## 2019-05-22 ENCOUNTER — Encounter: Payer: BC Managed Care – PPO | Admitting: Physical Therapy

## 2019-05-26 ENCOUNTER — Ambulatory Visit: Payer: BC Managed Care – PPO | Admitting: Physical Therapy

## 2019-05-26 ENCOUNTER — Other Ambulatory Visit: Payer: Self-pay

## 2019-05-26 ENCOUNTER — Encounter: Payer: Self-pay | Admitting: Physical Therapy

## 2019-05-26 DIAGNOSIS — Z483 Aftercare following surgery for neoplasm: Secondary | ICD-10-CM | POA: Diagnosis not present

## 2019-05-26 DIAGNOSIS — M25611 Stiffness of right shoulder, not elsewhere classified: Secondary | ICD-10-CM

## 2019-05-26 DIAGNOSIS — M6281 Muscle weakness (generalized): Secondary | ICD-10-CM

## 2019-05-26 DIAGNOSIS — I972 Postmastectomy lymphedema syndrome: Secondary | ICD-10-CM

## 2019-05-26 DIAGNOSIS — M25612 Stiffness of left shoulder, not elsewhere classified: Secondary | ICD-10-CM | POA: Diagnosis not present

## 2019-05-26 DIAGNOSIS — M25511 Pain in right shoulder: Secondary | ICD-10-CM | POA: Diagnosis not present

## 2019-05-26 DIAGNOSIS — L599 Disorder of the skin and subcutaneous tissue related to radiation, unspecified: Secondary | ICD-10-CM

## 2019-05-26 DIAGNOSIS — R293 Abnormal posture: Secondary | ICD-10-CM | POA: Diagnosis not present

## 2019-05-26 NOTE — Therapy (Signed)
[place], [place], [address] Phone: [phone]   Fax:  [phone]  Physical Therapy Treatment  Patient Details  Name: Darlene Grant MRN: [id number] Date of Birth: [birth year] Referring Provider (PT): Dr. Nicholas Lose; Dr. Clovia Cuff   Encounter Date: 05/26/2019  PT End of Session - 05/26/19 1105    Visit Number  32    Number of Visits  45    Date for PT Re-Evaluation  06/19/19    PT Start Time  1000    PT Stop Time  1050    PT Time Calculation (min)  50 min    Activity Tolerance  Patient tolerated treatment well    Behavior During Therapy  [facility] for tasks assessed/performed       Past Medical History:  Diagnosis Date  . Abnormal Pap smear    ASCUS 03/13/11  . Anemia   . Breast pain, left   . Cancer ([place])   . Candida vaginitis   . Complication of anesthesia    pt states that all narcotics make her angry and she would prefer to avoid them.  . Rectal itching   . Vaginal Pap smear, abnormal     Past Surgical History:  Procedure Laterality Date  . BREAST BIOPSY Right 8/16  . CESAREAN SECTION N/A 11/21/2013   Procedure: CESAREAN SECTION;  Surgeon: Alwyn Pea, MD;  Location: [facility];  Service: Obstetrics;  Laterality: N/A;  . CESAREAN SECTION N/A 06/23/2016   Procedure: CESAREAN SECTION;  Surgeon: Emily Filbert, MD;  Location: [facility];  Service: Obstetrics;  Laterality: N/A;  . HAND SURGERY  04/2012   ligament repair  . MASTECTOMY WITH AXILLARY LYMPH NODE DISSECTION Bilateral 11/14/2018   Procedure: RIGHT MASTECTOMY WITH RIGHT AXILLARY LYMPH NODE DISSECTION AND LEFT RISK REDUCING MASTECTOMY;  Surgeon: Rolm Bookbinder, MD;  Location: [place];  Service: General;  Laterality: Bilateral;  LATE ENTRY: Corrected laterality documentation to read right axillary lymph node dissection vs. documented laterality of left.    [place] Kitchen PILONIDAL CYST EXCISION  2000  .  PORTACATH PLACEMENT N/A 05/28/2018   Procedure: INSERTION PORT-A-CATH WITH ULTRASOUND;  Surgeon: Rolm Bookbinder, MD;  Location: [place];  Service: General;  Laterality: N/A;  . WISDOM TOOTH EXTRACTION  AGE 43    There were no vitals filed for this visit.  Subjective Assessment - 05/26/19 1058    Subjective  Pt states her arm and hand have been achy at times.    Pertinent History  breast cancer diagnosed November 2019: triple negative with Ki of 75% She completed chemotherapy 10/18/2018. 11/14/2018: she underwent bilateral mastectomy with 0/13 nodes positive on the right; radiation ended on 02/10/2019    Patient Stated Goals  sleep without pain, exercise without discomfort, carry daughter on left due to radiation on the right breast    Currently in Pain?  Yes    Pain Score  --   did not rate   Pain Location  Arm    Pain Orientation  Right    Pain Descriptors / Indicators  Aching    Pain Radiating Towards  to hand    Pain Onset  More than a month ago    Pain Frequency  Intermittent            LYMPHEDEMA/ONCOLOGY QUESTIONNAIRE - 05/26/19 1018      Right Upper Extremity Lymphedema   10 cm Proximal to Olecranon Process  27.5 cm  Olecranon Process  23.5 cm    15 cm Proximal to Ulnar Styloid Process  23.5 cm    Just Proximal to Ulnar Styloid Process  16.5 cm    Across Hand at [employer]  18.8 cm    At [place] of 2nd Digit  6 cm                [facility] Adult PT Treatment/Exercise - 05/26/19 0001      Shoulder Exercises: Stretch   Other Shoulder Stretches  in tall kneeling with left knee up  away  from wall with yoga block from inside knee to wall and backwards rotation of right arm against wall for thoracic rotation.,  Pt felt that she was able to get the stretch she needed with this exercise       Manual Therapy   Manual Therapy  Edema management;Manual Lymphatic Drainage (MLD)    Edema Management  gave small dotted foam to wear inside bra at lateal  chest     Manual Lymphatic Drainage (MLD)  in supine for short neck and adominal scooping, right inguinal nodes and right axillo-inguinal anastamosis, left axillary nodes and anterior interaxillary anastamosis and chest, then to left sidelying for  posterior interaxillay anastamosis being careful not to overstretch radiated skin  Extra time spent on lateral trunk just distal to axilla                PT Short Term Goals - 03/31/19 1246      PT SHORT TERM GOAL #1   Title  independent with initial HEP for left hip    Time  4    Period  Weeks    Status  Achieved      PT SHORT TERM GOAL #2   Title  understand how to take care of vaginal health to reduce dryness and keep tissue mobility    Period  Weeks    Status  Achieved        PT Long Term Goals - 04/28/19 1207      PT LONG TERM GOAL #8   Title  Pt will report that the discomfort in her right axilla and radiation area is decreased by 50% and that she will be able to manage it at home    Time  8    Period  Weeks    Status  On-going            Plan - 05/26/19 1106    Clinical Impression Statement  Pt reports she is having some fullness inright  lateral chest and achiness in right arm  Upgraded stretches to this area and encouraged her to wear compression during exercise. Also focused on this area with MLD    Comorbidities  recent chemo and radiation    Examination-Activity Limitations  Bathing;Reach Overhead;Caring for Others;Lift;Hygiene/Grooming;Locomotion Level;Sleep    Stability/Clinical Decision Making  Evolving/Moderate complexity    Rehab Potential  Good    PT Treatment/Interventions  ADLs/Self Care Home Management;Therapeutic exercise;Manual techniques;Biofeedback;Electrical Stimulation;Therapeutic activities;Neuromuscular re-education;Patient/family education;Dry needling;Passive range of motion;Spinal Manipulations;Manual lymph drainage;Taping;Compression bandaging;Orthotic Fit/Training;Iontophoresis 4mg /ml  Dexamethasone    PT Next Visit Plan  work on shoulder ROM, hip and core strength; dry needling, for ortho, manual lymph drainage, soft tissue work   and add closed chain UE exercise for lymph; iont left shoulder    Consulted and Agree with Plan of Care  Patient       Patient will benefit from skilled therapeutic intervention in order to improve the  following deficits and impairments:  Decreased knowledge of precautions, Decreased knowledge of use of DME, Postural dysfunction, Decreased range of motion, Increased fascial restricitons, Impaired UE functional use, Increased muscle spasms, Impaired perceived functional ability, Pain, Decreased scar mobility, Decreased strength, Increased edema, Difficulty walking, Decreased activity tolerance, Decreased mobility  Visit Diagnosis: Aftercare following surgery for neoplasm  Muscle weakness (generalized)  Disorder of the skin and subcutaneous tissue related to radiation, unspecified  Postmastectomy lymphedema  Stiffness of right shoulder joint  Acute pain of right shoulder     Problem List Patient Active Problem List   Diagnosis Date Noted  . History of right breast cancer 11/14/2018  . Chemotherapy induced neutropenia ([place]) 09/13/2018  . Port-A-Cath in place 06/10/2018  . Malignant neoplasm of upper-outer quadrant of right breast in female, estrogen receptor negative ([place]) 05/09/2018  . Low iron stores 01/16/2018  . Muscle spasm 01/15/2018  . Piriformis syndrome of left side 01/15/2018  . Grade I hemorrhoids 02/23/2017  . Low lying posterior placenta, antepartum 02/01/2016  . AMA (advanced maternal age) multigravida 35+ 11/22/2015  . 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 Calcification of right breast 02/19/2015  . Ligament tear 07/05/2012   Donato Heinz. Owens Shark PT  Norwood Levo 05/26/2019, 11:08 AM  [place], [place], [address] Phone:  [phone]   Fax:  [phone]  Name: PROSPERITY ANDRASKO MRN: [id number] Date of Birth: Dec 16, 1975

## 2019-05-26 NOTE — Progress Notes (Signed)
Spoke to patient on via phone on 05/20/19 for a scheduled counseling intake phone call. Counseling Intern provided therapeutic listening and emotional support while pt discussed challenges related to adjusting to life post treatment and managing her role as a busy mother of two young children. Pt expressed graditude for her care during her cancer tx and stated that her strengths are her persistence, her faith, and her internal sense of self. Pt inquired about a coping strategy for anxious thoughts. Counseling Intern suggested that pt try thought restructuring exercise for coping with intrusive thoughts and anxiety. Pt agreed to try out the strategy. Pt requested that our next check-in call is in 2021.  Counseling intern will call patient on Monday, June 23, 2019 to inquire about interest in scheduling additional counseling appointments via phone.   Art Buff [place] Counseling Intern Voicemail:  [phone]

## 2019-05-29 ENCOUNTER — Other Ambulatory Visit: Payer: Self-pay

## 2019-05-29 ENCOUNTER — Ambulatory Visit: Payer: BC Managed Care – PPO | Attending: Hematology and Oncology | Admitting: Physical Therapy

## 2019-05-29 ENCOUNTER — Encounter: Payer: Self-pay | Admitting: Physical Therapy

## 2019-05-29 DIAGNOSIS — Z483 Aftercare following surgery for neoplasm: Secondary | ICD-10-CM

## 2019-05-29 DIAGNOSIS — M25552 Pain in left hip: Secondary | ICD-10-CM

## 2019-05-29 DIAGNOSIS — M6281 Muscle weakness (generalized): Secondary | ICD-10-CM | POA: Diagnosis not present

## 2019-05-29 DIAGNOSIS — M25612 Stiffness of left shoulder, not elsewhere classified: Secondary | ICD-10-CM

## 2019-05-29 NOTE — Therapy (Signed)
[facility] [address], [place], [place], [address] Phone: [phone]   Fax:  [phone]  Physical Therapy Treatment  Patient Details  Name: Darlene Grant MRN: [id number] Date of Birth: Oct 21, 1975 Referring Provider (PT): Dr. Nicholas Lose; Dr. Clovia Cuff   Encounter Date: 05/29/2019  PT End of Session - 05/29/19 1015    Visit Number  33    Date for PT Re-Evaluation  06/19/19    Authorization Type  [employer]    PT Start Time  0930    PT Stop Time  1010    PT Time Calculation (min)  40 min    Activity Tolerance  Patient tolerated treatment well    Behavior During Therapy  [facility] for tasks assessed/performed       Past Medical History:  Diagnosis Date  . Abnormal Pap smear    ASCUS 03/13/11  . Anemia   . Breast pain, left   . Cancer ([place])   . Candida vaginitis   . Complication of anesthesia    pt states that all narcotics make her angry and she would prefer to avoid them.  . Rectal itching   . Vaginal Pap smear, abnormal     Past Surgical History:  Procedure Laterality Date  . BREAST BIOPSY Right 8/16  . CESAREAN SECTION N/A 11/21/2013   Procedure: CESAREAN SECTION;  Surgeon: Alwyn Pea, MD;  Location: [facility];  Service: Obstetrics;  Laterality: N/A;  . CESAREAN SECTION N/A 06/23/2016   Procedure: CESAREAN SECTION;  Surgeon: Emily Filbert, MD;  Location: [place];  Service: Obstetrics;  Laterality: N/A;  . HAND SURGERY  04/2012   ligament repair  . MASTECTOMY WITH AXILLARY LYMPH NODE DISSECTION Bilateral 11/14/2018   Procedure: RIGHT MASTECTOMY WITH RIGHT AXILLARY LYMPH NODE DISSECTION AND LEFT RISK REDUCING MASTECTOMY;  Surgeon: Rolm Bookbinder, MD;  Location: [place];  Service: General;  Laterality: Bilateral;  LATE ENTRY: Corrected laterality documentation to read right axillary lymph node dissection vs. documented laterality of left.    Marland Kitchen PILONIDAL CYST EXCISION  2000   . PORTACATH PLACEMENT N/A 05/28/2018   Procedure: INSERTION PORT-A-CATH WITH ULTRASOUND;  Surgeon: Rolm Bookbinder, MD;  Location: [facility];  Service: General;  Laterality: N/A;  . WISDOM TOOTH EXTRACTION  AGE 39    There were no vitals filed for this visit.  Subjective Assessment - 05/29/19 0933    Subjective  The left hip pain is up and down. I did a cardio hit workout and the hip has been irritated.    Pertinent History  breast cancer diagnosed November 2019: triple negative with Ki of 75% She completed chemotherapy 10/18/2018. 11/14/2018: she underwent bilateral mastectomy with 0/13 nodes positive on the right; radiation ended on 02/10/2019    Patient Stated Goals  sleep without pain, exercise without discomfort, carry daughter on left due to radiation on the right breast    Currently in Pain?  Yes    Pain Score  4     Pain Location  Shoulder    Pain Orientation  Left    Pain Descriptors / Indicators  Aching    Pain Type  Chronic pain    Pain Onset  More than a month ago    Pain Frequency  Intermittent    Aggravating Factors   bring left arm behind her, bring left arm overhead, taking shirt off    Pain Relieving Factors  arm at side    Multiple  Pain Sites  No         [facility] PT Assessment - 05/29/19 0001      Palpation   SI assessment   left ilium rotated post. and was able to be corrected                   [facility] Adult PT Treatment/Exercise - 05/29/19 0001      Knee/Hip Exercises: Aerobic   Stationary Bike  6 minutes level 3 -4  while assessing patient      Shoulder Exercises: Standing   Protraction  Strengthening;Both;10 reps    Theraband Level (Shoulder Protraction)  Level 1 (Yellow)    Flexion  Right;Left;10 reps    Theraband Level (Shoulder Flexion)  Level 1 (Yellow)    Row  Strengthening;Both;10 reps    Theraband Level (Shoulder Row)  Level 1 (Yellow)    Other Standing Exercises  standing facing wall and move arms in Y motion to work  scapula      Manual Therapy   Manual Therapy  Soft tissue mobilization;Scapular mobilization;Muscle Energy Technique    Soft tissue mobilization  around the border of the scapula and levator scapulae    Scapular Mobilization  lifting of the scapula in right sidely    Muscle Energy Technique  correct left ilium             PT Education - 05/29/19 0958    Education Details  Access Code: [email or web address]    Person(s) Educated  Patient    Methods  Explanation;Demonstration;Verbal cues;Handout    Comprehension  Returned demonstration;Verbalized understanding       PT Short Term Goals - 03/31/19 1246      PT SHORT TERM GOAL #1   Title  independent with initial HEP for left hip    Time  4    Period  Weeks    Status  Achieved      PT SHORT TERM GOAL #2   Title  understand how to take care of vaginal health to reduce dryness and keep tissue mobility    Period  Weeks    Status  Achieved        PT Long Term Goals - 05/29/19 1008      PT LONG TERM GOAL #5   Title  Patient able to ambulate without left hip pain due to pelvis in correct alignment    Baseline  hip pain improved by 75%, pelvis not able in correct alignment    Time  12    Period  Weeks    Status  On-going      PT LONG TERM GOAL #6   Title  able to lay on left hip for sleeping with minimal to no pain    Baseline  75% better    Time  12    Period  Weeks    Status  On-going      PT LONG TERM GOAL #7   Title  able to hold her child on the left hip due to the radiation soreness on the right breast with pain minimal to none    Baseline  90% better    Time  12    Period  Weeks    Status  On-going            Plan - 05/29/19 0942    Clinical Impression Statement  Patient pelvis was not in correct alignment. Therapist was able to correct the left ilium to keep in alignment. Patient is able  to lay on left side with 75% less pain and walk with 75% less pain. Patient had tightness in the left shoulder blade. Patient  has learned scapula and rotator cuff strength with theraband and in standing. Patient will benefit from skilled therapy to improve scapula strength and left hip mobility and strength.    Personal Factors and Comorbidities  Other    Comorbidities  recent chemo and radiation    Examination-Activity Limitations  Bathing;Reach Overhead;Caring for Others;Lift;Hygiene/Grooming;Locomotion Level;Sleep    Stability/Clinical Decision Making  Evolving/Moderate complexity    Rehab Potential  Good    PT Frequency  1x / week    PT Duration  8 weeks    PT Treatment/Interventions  ADLs/Self Care Home Management;Therapeutic exercise;Manual techniques;Biofeedback;Electrical Stimulation;Therapeutic activities;Neuromuscular re-education;Patient/family education;Dry needling;Passive range of motion;Spinal Manipulations;Manual lymph drainage;Taping;Compression bandaging;Orthotic Fit/Training;Iontophoresis 4mg /ml Dexamethasone    PT Next Visit Plan  work on shoulder ROM, hip and core strength; dry needling, for ortho, manual lymph drainage, soft tissue work   and add closed chain UE exercise for lymph; iont left shoulder    PT Home Exercise Plan  Access Code: [email or web address]    Consulted and Agree with Plan of Care  Patient       Patient will benefit from skilled therapeutic intervention in order to improve the following deficits and impairments:  Decreased knowledge of precautions, Decreased knowledge of use of DME, Postural dysfunction, Decreased range of motion, Increased fascial restricitons, Impaired UE functional use, Increased muscle spasms, Impaired perceived functional ability, Pain, Decreased scar mobility, Decreased strength, Increased edema, Difficulty walking, Decreased activity tolerance, Decreased mobility  Visit Diagnosis: Aftercare following surgery for neoplasm  Muscle weakness (generalized)  Stiffness of left shoulder, not elsewhere classified  Pain in left hip     Problem List Patient Active  Problem List   Diagnosis Date Noted  . History of right breast cancer 11/14/2018  . Chemotherapy induced neutropenia ([place]) 09/13/2018  . Port-A-Cath in place 06/10/2018  . Malignant neoplasm of upper-outer quadrant of right breast in female, estrogen receptor negative ([place]) 05/09/2018  . Low iron stores 01/16/2018  . Muscle spasm 01/15/2018  . Piriformis syndrome of left side 01/15/2018  . Grade I hemorrhoids 02/23/2017  . Low lying posterior placenta, antepartum 02/01/2016  . AMA (advanced maternal age) multigravida 35+ 11/22/2015  . 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 Calcification of right breast 02/19/2015  . Ligament tear 07/05/2012    Earlie Counts, PT 05/29/19 10:16 AM   [facility] [address] [place], [place], [address] Phone: [phone]   Fax:  [phone]  Name: Darlene Grant MRN: [id number] Date of Birth: November 18, 1975

## 2019-05-29 NOTE — Patient Instructions (Signed)
Access Code: [email or web address]  URL: [email or web address]  Date: 05/29/2019  Prepared by: Earlie Counts   Exercises Seated Quadratus Lumborum Stretch with Arm Overhead - 2 reps                   - 1 sets - 30 sec hold - 1x daily - 7x weekly Seated Piriformis Stretch with Trunk Bend - 2 reps - 1 sets - 30 sec hold - 1x daily - 7x weekly Seated Hamstring Stretch - 2 reps - 1 sets - 30 sec hold - 1x daily - 7x weekly Supine Bridge - 15 reps - 1 sets - 1x daily - 7x weekly Supine Transversus Abdominis Bracing - Hands on Stomach - 10 reps - 1 sets - 5 sec hold - 1x daily - 7x weekly Bent Knee Fallouts - 10 reps - 1 sets - 1x daily - 7x weekly Hip Abduction with Resistance Loop - 10 reps - 1 sets - 1x daily - 7x weekly Standing Hip Extension Kicks - 10 reps - 1 sets - 1x daily - 7x weekly Standing Bilateral Shoulder Scaption Wall Slide - 10 reps - 1 sets - 1x daily - 7x weekly Shoulder Flexion with Anterior Anchored Resistance - 10 reps - 1 sets - 1x daily - 7x weekly Standing High Shoulder Row with Anchored Resistance - 10 reps - 1 sets - 1x daily - 7x weekly Standing Scapular Protraction with Resistance - 10 reps - 2 sets - 1x daily - 7x weekly Patient Education Trigger [facility] Dry Needling [facility] [address] [place], [place] [address] Phone # [phone] Fax [phone]

## 2019-06-02 ENCOUNTER — Encounter: Payer: Self-pay | Admitting: Physical Therapy

## 2019-06-02 ENCOUNTER — Ambulatory Visit: Payer: BC Managed Care – PPO | Admitting: Physical Therapy

## 2019-06-02 ENCOUNTER — Other Ambulatory Visit: Payer: Self-pay

## 2019-06-02 DIAGNOSIS — I972 Postmastectomy lymphedema syndrome: Secondary | ICD-10-CM | POA: Diagnosis not present

## 2019-06-02 DIAGNOSIS — R293 Abnormal posture: Secondary | ICD-10-CM | POA: Diagnosis not present

## 2019-06-02 DIAGNOSIS — Z483 Aftercare following surgery for neoplasm: Secondary | ICD-10-CM | POA: Diagnosis not present

## 2019-06-02 DIAGNOSIS — M6281 Muscle weakness (generalized): Secondary | ICD-10-CM | POA: Diagnosis not present

## 2019-06-02 DIAGNOSIS — M25611 Stiffness of right shoulder, not elsewhere classified: Secondary | ICD-10-CM | POA: Diagnosis not present

## 2019-06-02 DIAGNOSIS — L599 Disorder of the skin and subcutaneous tissue related to radiation, unspecified: Secondary | ICD-10-CM | POA: Diagnosis not present

## 2019-06-02 DIAGNOSIS — M25511 Pain in right shoulder: Secondary | ICD-10-CM

## 2019-06-02 DIAGNOSIS — M25612 Stiffness of left shoulder, not elsewhere classified: Secondary | ICD-10-CM | POA: Diagnosis not present

## 2019-06-02 NOTE — Therapy (Signed)
[place], [place], [address] Phone: [phone]   Fax:  [phone]  Physical Therapy Treatment  Patient Details  Name: Darlene Grant MRN: [id number] Date of Birth: 1975-12-18 Referring Provider (PT): Dr. Nicholas Lose; Dr. Clovia Cuff   Encounter Date: 06/02/2019  PT End of Session - 06/02/19 1213    Visit Number  34    Number of Visits  45    Date for PT Re-Evaluation  06/19/19    PT Start Time  1000    PT Stop Time  1045    PT Time Calculation (min)  45 min    Activity Tolerance  Patient tolerated treatment well    Behavior During Therapy  [facility] for tasks assessed/performed       Past Medical History:  Diagnosis Date  . Abnormal Pap smear    ASCUS 03/13/11  . Anemia   . Breast pain, left   . Cancer ([place])   . Candida vaginitis   . Complication of anesthesia    pt states that all narcotics make her angry and she would prefer to avoid them.  . Rectal itching   . Vaginal Pap smear, abnormal     Past Surgical History:  Procedure Laterality Date  . BREAST BIOPSY Right 8/16  . CESAREAN SECTION N/A 11/21/2013   Procedure: CESAREAN SECTION;  Surgeon: Alwyn Pea, MD;  Location: [facility];  Service: Obstetrics;  Laterality: N/A;  . CESAREAN SECTION N/A 06/23/2016   Procedure: CESAREAN SECTION;  Surgeon: Emily Filbert, MD;  Location: [facility];  Service: Obstetrics;  Laterality: N/A;  . HAND SURGERY  04/2012   ligament repair  . MASTECTOMY WITH AXILLARY LYMPH NODE DISSECTION Bilateral 11/14/2018   Procedure: RIGHT MASTECTOMY WITH RIGHT AXILLARY LYMPH NODE DISSECTION AND LEFT RISK REDUCING MASTECTOMY;  Surgeon: Rolm Bookbinder, MD;  Location: [facility];  Service: General;  Laterality: Bilateral;  LATE ENTRY: Corrected laterality documentation to read right axillary lymph node dissection vs. documented laterality of left.    Marland Kitchen PILONIDAL CYST EXCISION  2000  .  PORTACATH PLACEMENT N/A 05/28/2018   Procedure: INSERTION PORT-A-CATH WITH ULTRASOUND;  Surgeon: Rolm Bookbinder, MD;  Location: [facility];  Service: General;  Laterality: N/A;  . WISDOM TOOTH EXTRACTION  AGE 77    There were no vitals filed for this visit.  Subjective Assessment - 06/02/19 1206    Subjective  Pt said she exercised over the weekend and is feeling some fullness in her lateral right chest.  She feels that the exercises Malachy Mood gave her have been helping her left shoulder    Pertinent History  breast cancer diagnosed November 2019: triple negative with Ki of 75% She completed chemotherapy 10/18/2018. 11/14/2018: she underwent bilateral mastectomy with 0/13 nodes positive on the right; radiation ended on 02/10/2019    Patient Stated Goals  sleep without pain, exercise without discomfort, carry daughter on left due to radiation on the right breast    Currently in Pain?  Yes    Pain Score  2     Pain Location  Chest    Pain Orientation  Left    Pain Descriptors / Indicators  Aching    Pain Onset  More than a month ago    Pain Onset  More than a month ago                       [facility] Adult PT Treatment/Exercise -  06/02/19 0001      Exercises   Exercises  Shoulder      Shoulder Exercises: Standing   Row  Strengthening;5 reps;Theraband    Theraband Level (Shoulder Row)  Level 1 (Yellow)    Row Limitations  bilateral with elbows straight and unilaterally       Shoulder Exercises: Stretch   Other Shoulder Stretches  pec minor stretch at doorway       Manual Therapy   Edema Management  updated  small dotted foam to wear inside bra at lateal chest     Manual Lymphatic Drainage (MLD)  in supine for short neck and adominal scooping, right inguinal nodes and right axillo-inguinal anastamosis, left axillary nodes and anterior interaxillary anastamosis and chest, then to left sidelying for  posterior interaxillay anastamosis being careful not to  overstretch radiated skin  Extra time spent on lateral trunk just distal to axilla                PT Short Term Goals - 03/31/19 1246      PT SHORT TERM GOAL #1   Title  independent with initial HEP for left hip    Time  4    Period  Weeks    Status  Achieved      PT SHORT TERM GOAL #2   Title  understand how to take care of vaginal health to reduce dryness and keep tissue mobility    Period  Weeks    Status  Achieved        PT Long Term Goals - 05/29/19 1008      PT LONG TERM GOAL #5   Title  Patient able to ambulate without left hip pain due to pelvis in correct alignment    Baseline  hip pain improved by 75%, pelvis not able in correct alignment    Time  12    Period  Weeks    Status  On-going      PT LONG TERM GOAL #6   Title  able to lay on left hip for sleeping with minimal to no pain    Baseline  75% better    Time  12    Period  Weeks    Status  On-going      PT LONG TERM GOAL #7   Title  able to hold her child on the left hip due to the radiation soreness on the right breast with pain minimal to none    Baseline  90% better    Time  12    Period  Weeks    Status  On-going            Plan - 06/02/19 1214    Clinical Impression Statement  Pt has developed more fullness in lateral chest after exercise. She gets some relief with MLD and compression. Upgraded standing scapular exercise    Comorbidities  recent chemo and radiation    Examination-Activity Limitations  Bathing;Reach Overhead;Caring for Others;Lift;Hygiene/Grooming;Locomotion Level;Sleep    PT Frequency  1x / week    PT Duration  8 weeks    PT Treatment/Interventions  ADLs/Self Care Home Management;Therapeutic exercise;Manual techniques;Biofeedback;Electrical Stimulation;Therapeutic activities;Neuromuscular re-education;Patient/family education;Dry needling;Passive range of motion;Spinal Manipulations;Manual lymph drainage;Taping;Compression bandaging;Orthotic Fit/Training;Iontophoresis  4mg /ml Dexamethasone    PT Next Visit Plan  work on shoulder ROM, hip and core strength; dry needling, for ortho, manual lymph drainage, soft tissue work   and add closed chain UE exercise for lymph; iont left shoulder    Consulted  and Agree with Plan of Care  Patient       Patient will benefit from skilled therapeutic intervention in order to improve the following deficits and impairments:  Decreased knowledge of precautions, Decreased knowledge of use of DME, Postural dysfunction, Decreased range of motion, Increased fascial restricitons, Impaired UE functional use, Increased muscle spasms, Impaired perceived functional ability, Pain, Decreased scar mobility, Decreased strength, Increased edema, Difficulty walking, Decreased activity tolerance, Decreased mobility  Visit Diagnosis: Aftercare following surgery for neoplasm  Muscle weakness (generalized)  Acute pain of right shoulder  Postmastectomy lymphedema  Abnormal posture     Problem List Patient Active Problem List   Diagnosis Date Noted  . History of right breast cancer 11/14/2018  . Chemotherapy induced neutropenia ([place]) 09/13/2018  . Port-A-Cath in place 06/10/2018  . Malignant neoplasm of upper-outer quadrant of right breast in female, estrogen receptor negative ([place]) 05/09/2018  . Low iron stores 01/16/2018  . Muscle spasm 01/15/2018  . Piriformis syndrome of left side 01/15/2018  . Grade I hemorrhoids 02/23/2017  . Low lying posterior placenta, antepartum 02/01/2016  . AMA (advanced maternal age) multigravida 35+ 11/22/2015  . 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 Calcification of right breast 02/19/2015  . Ligament tear 07/05/2012   Donato Heinz. Owens Shark PT  Norwood Levo 06/02/2019, 12:16 PM  [place], [place], [address] Phone: [phone]   Fax:  [phone]  Name: Darlene Grant MRN: [id number] Date of  Birth: 26-Nov-1975

## 2019-06-05 ENCOUNTER — Ambulatory Visit: Payer: BC Managed Care – PPO | Admitting: Physical Therapy

## 2019-06-05 ENCOUNTER — Encounter: Payer: Self-pay | Admitting: Physical Therapy

## 2019-06-05 ENCOUNTER — Other Ambulatory Visit: Payer: Self-pay

## 2019-06-05 DIAGNOSIS — Z483 Aftercare following surgery for neoplasm: Secondary | ICD-10-CM | POA: Diagnosis not present

## 2019-06-05 DIAGNOSIS — M6281 Muscle weakness (generalized): Secondary | ICD-10-CM

## 2019-06-05 DIAGNOSIS — M25612 Stiffness of left shoulder, not elsewhere classified: Secondary | ICD-10-CM

## 2019-06-05 DIAGNOSIS — M25552 Pain in left hip: Secondary | ICD-10-CM

## 2019-06-05 NOTE — Therapy (Signed)
[facility] [address] [place], [place], [address] Phone: [phone]   Fax:  [phone]  Physical Therapy Treatment  Patient Details  Name: Darlene Grant MRN: [id number] Date of Birth: Oct 06, 1975 Referring Provider (PT): Dr. Nicholas Lose; Dr. Clovia Cuff   Encounter Date: 06/05/2019  PT End of Session - 06/05/19 0937    Visit Number  35    Date for PT Re-Evaluation  06/19/19    Authorization Type  [employer]    PT Start Time  0936    PT Stop Time  1015    PT Time Calculation (min)  39 min    Activity Tolerance  Patient tolerated treatment well    Behavior During Therapy  [facility] for tasks assessed/performed       Past Medical History:  Diagnosis Date  . Abnormal Pap smear    ASCUS 03/13/11  . Anemia   . Breast pain, left   . Cancer ([place])   . Candida vaginitis   . Complication of anesthesia    pt states that all narcotics make her angry and she would prefer to avoid them.  . Rectal itching   . Vaginal Pap smear, abnormal     Past Surgical History:  Procedure Laterality Date  . BREAST BIOPSY Right 8/16  . CESAREAN SECTION N/A 11/21/2013   Procedure: CESAREAN SECTION;  Surgeon: Alwyn Pea, MD;  Location: [facility];  Service: Obstetrics;  Laterality: N/A;  . CESAREAN SECTION N/A 06/23/2016   Procedure: CESAREAN SECTION;  Surgeon: Emily Filbert, MD;  Location: [place];  Service: Obstetrics;  Laterality: N/A;  . HAND SURGERY  04/2012   ligament repair  . MASTECTOMY WITH AXILLARY LYMPH NODE DISSECTION Bilateral 11/14/2018   Procedure: RIGHT MASTECTOMY WITH RIGHT AXILLARY LYMPH NODE DISSECTION AND LEFT RISK REDUCING MASTECTOMY;  Surgeon: Rolm Bookbinder, MD;  Location: [place];  Service: General;  Laterality: Bilateral;  LATE ENTRY: Corrected laterality documentation to read right axillary lymph node dissection vs. documented laterality of left.    Marland Kitchen PILONIDAL CYST EXCISION  2000   . PORTACATH PLACEMENT N/A 05/28/2018   Procedure: INSERTION PORT-A-CATH WITH ULTRASOUND;  Surgeon: Rolm Bookbinder, MD;  Location: [place];  Service: General;  Laterality: N/A;  . WISDOM TOOTH EXTRACTION  AGE 33    There were no vitals filed for this visit.  Subjective Assessment - 06/05/19 0939    Subjective  left hip is okay and just uncomfortable. The left shoulder is so much better. The new exercises have helped.    Pertinent History  breast cancer diagnosed November 2019: triple negative with Ki of 75% She completed chemotherapy 10/18/2018. 11/14/2018: she underwent bilateral mastectomy with 0/13 nodes positive on the right; radiation ended on 02/10/2019    Patient Stated Goals  sleep without pain, exercise without discomfort, carry daughter on left due to radiation on the right breast    Currently in Pain?  No/denies                       [facility] Adult PT Treatment/Exercise - 06/05/19 0001      Neck Exercises: Prone   Other Prone Exercise  modified PLANK  with elbows on red physioball and knees      Knee/Hip Exercises: Aerobic   Stationary Bike  6 minutes level 3 -4  while assessing patient      Knee/Hip Exercises: Prone   Hip Extension  Right;Left;1 set;10 reps  Hip Extension Limitations  on red physioball    Other Prone Exercises  prone on red physioball-hip abduction with toe tap, leg circles, hip ER      Shoulder Exercises: Supine   Other Supine Exercises  supine with feet up and ball on abdomen and alternate shoulder and hip ;       Shoulder Exercises: Prone   Other Prone Exercises  prone I, Y, T to work core and shoulder    Other Prone Exercises  prone on ball opposite shoulder and hip       Shoulder Exercises: Stretch   Other Shoulder Stretches  childs pose with red physioball and move side to side      Manual Therapy   Manual Therapy  Soft tissue mobilization    Soft tissue mobilization  along the left quadratus and gluteus  medius       Trigger Point Dry Needling - 06/05/19 0001    Consent Given?  Yes    Education Handout Provided  Previously provided    Muscles Treated Back/Hip  Quadratus lumborum   left   Gluteus Medius Response  Twitch response elicited;Palpable increased muscle length   left   Quadratus Lumborum Response  Twitch response elicited;Palpable increased muscle length   left            PT Short Term Goals - 03/31/19 1246      PT SHORT TERM GOAL #1   Title  independent with initial HEP for left hip    Time  4    Period  Weeks    Status  Achieved      PT SHORT TERM GOAL #2   Title  understand how to take care of vaginal health to reduce dryness and keep tissue mobility    Period  Weeks    Status  Achieved        PT Long Term Goals - 05/29/19 1008      PT LONG TERM GOAL #5   Title  Patient able to ambulate without left hip pain due to pelvis in correct alignment    Baseline  hip pain improved by 75%, pelvis not able in correct alignment    Time  12    Period  Weeks    Status  On-going      PT LONG TERM GOAL #6   Title  able to lay on left hip for sleeping with minimal to no pain    Baseline  75% better    Time  12    Period  Weeks    Status  On-going      PT LONG TERM GOAL #7   Title  able to hold her child on the left hip due to the radiation soreness on the right breast with pain minimal to none    Baseline  90% better    Time  12    Period  Weeks    Status  On-going            Plan - 06/05/19 1011    Clinical Impression Statement  Patient is having increased strenght in left shoulder due to less pain. Patient has learned more shoulder exercises with the physioball to challenge her core and extremities. Patient had a trigger point in the left quadratus and gluteus medius that was resolved after dry needling. Patient has 1 more visit with ortho PT to work on [employer].    Personal Factors and Comorbidities  Other    Comorbidities  recent  chemo and radiation     Examination-Activity Limitations  Bathing;Reach Overhead;Caring for Others;Lift;Hygiene/Grooming;Locomotion Level;Sleep    Stability/Clinical Decision Making  Evolving/Moderate complexity    Rehab Potential  Good    PT Frequency  1x / week    PT Duration  8 weeks    PT Treatment/Interventions  ADLs/Self Care Home Management;Therapeutic exercise;Manual techniques;Biofeedback;Electrical Stimulation;Therapeutic activities;Neuromuscular re-education;Patient/family education;Dry needling;Passive range of motion;Spinal Manipulations;Manual lymph drainage;Taping;Compression bandaging;Orthotic Fit/Training;Iontophoresis 4mg /ml Dexamethasone    PT Next Visit Plan  work on shoulder ROM, hip and core strength; dry needling, for ortho, manual lymph drainage, soft tissue work   and add closed chain UE exercise for lymph; iont left shoulder    PT Home Exercise Plan  Access Code: [email or web address]    Consulted and Agree with Plan of Care  Patient       Patient will benefit from skilled therapeutic intervention in order to improve the following deficits and impairments:  Decreased knowledge of precautions, Decreased knowledge of use of DME, Postural dysfunction, Decreased range of motion, Increased fascial restricitons, Impaired UE functional use, Increased muscle spasms, Impaired perceived functional ability, Pain, Decreased scar mobility, Decreased strength, Increased edema, Difficulty walking, Decreased activity tolerance, Decreased mobility  Visit Diagnosis: Aftercare following surgery for neoplasm  Muscle weakness (generalized)  Stiffness of left shoulder, not elsewhere classified  Pain in left hip     Problem List Patient Active Problem List   Diagnosis Date Noted  . History of right breast cancer 11/14/2018  . Chemotherapy induced neutropenia ([place]) 09/13/2018  . Port-A-Cath in place 06/10/2018  . Malignant neoplasm of upper-outer quadrant of right breast in female, estrogen receptor negative ([place])  05/09/2018  . Low iron stores 01/16/2018  . Muscle spasm 01/15/2018  . Piriformis syndrome of left side 01/15/2018  . Grade I hemorrhoids 02/23/2017  . Low lying posterior placenta, antepartum 02/01/2016  . AMA (advanced maternal age) multigravida 35+ 11/22/2015  . 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 Calcification of right breast 02/19/2015  . Ligament tear 07/05/2012    Earlie Counts, PT 06/05/19 10:14 AM   [facility] [address], [place], [place], [address] Phone: [phone]   Fax:  [phone]  Name: SHAMARIA WERKING MRN: [id number] Date of Birth: [birth year]

## 2019-06-09 ENCOUNTER — Ambulatory Visit: Payer: BC Managed Care – PPO | Admitting: Physical Therapy

## 2019-06-09 ENCOUNTER — Encounter: Payer: Self-pay | Admitting: Physical Therapy

## 2019-06-09 ENCOUNTER — Other Ambulatory Visit: Payer: Self-pay

## 2019-06-09 DIAGNOSIS — M25611 Stiffness of right shoulder, not elsewhere classified: Secondary | ICD-10-CM

## 2019-06-09 DIAGNOSIS — M6281 Muscle weakness (generalized): Secondary | ICD-10-CM | POA: Diagnosis not present

## 2019-06-09 DIAGNOSIS — M25511 Pain in right shoulder: Secondary | ICD-10-CM | POA: Diagnosis not present

## 2019-06-09 DIAGNOSIS — I972 Postmastectomy lymphedema syndrome: Secondary | ICD-10-CM | POA: Diagnosis not present

## 2019-06-09 DIAGNOSIS — R293 Abnormal posture: Secondary | ICD-10-CM | POA: Diagnosis not present

## 2019-06-09 DIAGNOSIS — Z483 Aftercare following surgery for neoplasm: Secondary | ICD-10-CM

## 2019-06-09 DIAGNOSIS — L599 Disorder of the skin and subcutaneous tissue related to radiation, unspecified: Secondary | ICD-10-CM | POA: Diagnosis not present

## 2019-06-09 DIAGNOSIS — C50911 Malignant neoplasm of unspecified site of right female breast: Secondary | ICD-10-CM | POA: Diagnosis not present

## 2019-06-09 DIAGNOSIS — M25612 Stiffness of left shoulder, not elsewhere classified: Secondary | ICD-10-CM | POA: Diagnosis not present

## 2019-06-09 NOTE — Therapy (Signed)
[place], [place], [address] Phone: [phone]   Fax:  [phone]  Physical Therapy Treatment  Patient Details  Name: Darlene Grant MRN: [id number] Date of Birth: 05-25-1976 Referring Provider (PT): Dr. Nicholas Lose; Dr. Clovia Cuff   Encounter Date: 06/09/2019  PT End of Session - 06/09/19 1239    Visit Number  36    Number of Visits  45    Date for PT Re-Evaluation  06/19/19    PT Start Time  1000    PT Stop Time  1045    PT Time Calculation (min)  45 min       Past Medical History:  Diagnosis Date  . Abnormal Pap smear    ASCUS 03/13/11  . Anemia   . Breast pain, left   . Cancer ([place])   . Candida vaginitis   . Complication of anesthesia    pt states that all narcotics make her angry and she would prefer to avoid them.  . Rectal itching   . Vaginal Pap smear, abnormal     Past Surgical History:  Procedure Laterality Date  . BREAST BIOPSY Right 8/16  . CESAREAN SECTION N/A 11/21/2013   Procedure: CESAREAN SECTION;  Surgeon: Alwyn Pea, MD;  Location: [facility];  Service: Obstetrics;  Laterality: N/A;  . CESAREAN SECTION N/A 06/23/2016   Procedure: CESAREAN SECTION;  Surgeon: Emily Filbert, MD;  Location: [facility];  Service: Obstetrics;  Laterality: N/A;  . HAND SURGERY  04/2012   ligament repair  . MASTECTOMY WITH AXILLARY LYMPH NODE DISSECTION Bilateral 11/14/2018   Procedure: RIGHT MASTECTOMY WITH RIGHT AXILLARY LYMPH NODE DISSECTION AND LEFT RISK REDUCING MASTECTOMY;  Surgeon: Rolm Bookbinder, MD;  Location: [place];  Service: General;  Laterality: Bilateral;  LATE ENTRY: Corrected laterality documentation to read right axillary lymph node dissection vs. documented laterality of left.    Marland Kitchen PILONIDAL CYST EXCISION  2000  . PORTACATH PLACEMENT N/A 05/28/2018   Procedure: INSERTION PORT-A-CATH WITH ULTRASOUND;  Surgeon: Rolm Bookbinder, MD;   Location: [facility];  Service: General;  Laterality: N/A;  . WISDOM TOOTH EXTRACTION  AGE 43    There were no vitals filed for this visit.  Subjective Assessment - 06/09/19 1236    Subjective  Pt reports she is doing well. She is increasing her exercise at home.  She still feels fullness in right posterior axilla and upper back and is going to [employer] for a new bra today    Pertinent History  breast cancer diagnosed November 2019: triple negative with Ki of 75% She completed chemotherapy 10/18/2018. 11/14/2018: she underwent bilateral mastectomy with 0/13 nodes positive on the right; radiation ended on 02/10/2019    Patient Stated Goals  sleep without pain, exercise without discomfort, carry daughter on left due to radiation on the right breast    Currently in Pain?  No/denies                       [facility] Adult PT Treatment/Exercise - 06/09/19 0001      Manual Therapy   Manual Therapy  Soft tissue mobilization;Myofascial release;Manual Lymphatic Drainage (MLD)    Joint Mobilization  to right lateral ribs in sidelying with vibration on the exhale     Soft tissue mobilization  with thick massage cream to  posterior shoulder and posterior axilla     Myofascial Release  to right lateral and  anterior chest, tightness  proximal to scar especilly visible in supine with arm overhead .     Manual Lymphatic Drainage (MLD)  in supine for short neck and adominal scooping, right inguinal nodes and right axillo-inguinal anastamosis, left axillary nodes and anterior interaxillary anastamosis and chest, then to left sidelying for  posterior interaxillay anastamosis being careful not to overstretch radiated skin  Extra time spent on lateral trunk just distal to axilla                PT Short Term Goals - 03/31/19 1246      PT SHORT TERM GOAL #1   Title  independent with initial HEP for left hip    Time  4    Period  Weeks    Status  Achieved      PT SHORT  TERM GOAL #2   Title  understand how to take care of vaginal health to reduce dryness and keep tissue mobility    Period  Weeks    Status  Achieved        PT Long Term Goals - 05/29/19 1008      PT LONG TERM GOAL #5   Title  Patient able to ambulate without left hip pain due to pelvis in correct alignment    Baseline  hip pain improved by 75%, pelvis not able in correct alignment    Time  12    Period  Weeks    Status  On-going      PT LONG TERM GOAL #6   Title  able to lay on left hip for sleeping with minimal to no pain    Baseline  75% better    Time  12    Period  Weeks    Status  On-going      PT LONG TERM GOAL #7   Title  able to hold her child on the left hip due to the radiation soreness on the right breast with pain minimal to none    Baseline  90% better    Time  12    Period  Weeks    Status  On-going            Plan - 06/09/19 1240    Clinical Impression Statement  Pt is steadliy improving, but has persistent fullness in right posterior shoulder and axilla with tightness in posterior shoulder and lats that softened up with soft tissue work    Comorbidities  recent chemo and radiation    Examination-Activity Limitations  Bathing;Reach Overhead;Caring for Others;Lift;Hygiene/Grooming;Locomotion Level;Sleep    PT Frequency  1x / week    PT Treatment/Interventions  ADLs/Self Care Home Management;Therapeutic exercise;Manual techniques;Biofeedback;Electrical Stimulation;Therapeutic activities;Neuromuscular re-education;Patient/family education;Dry needling;Passive range of motion;Spinal Manipulations;Manual lymph drainage;Taping;Compression bandaging;Orthotic Fit/Training;Iontophoresis 4mg /ml Dexamethasone    PT Next Visit Plan  work on shoulder ROM, hip and core strength; dry needling, for ortho, manual lymph drainage, soft tissue work  with myofacial release for lymph;    Consulted and Agree with Plan of Care  Patient       Patient will benefit from skilled  therapeutic intervention in order to improve the following deficits and impairments:  Decreased knowledge of precautions, Decreased knowledge of use of DME, Postural dysfunction, Decreased range of motion, Increased fascial restricitons, Impaired UE functional use, Increased muscle spasms, Impaired perceived functional ability, Pain, Decreased scar mobility, Decreased strength, Increased edema, Difficulty walking, Decreased activity tolerance, Decreased mobility  Visit Diagnosis: Aftercare following surgery for neoplasm  Muscle weakness (generalized)  Acute pain of right shoulder  Postmastectomy lymphedema  Abnormal posture  Disorder of the skin and subcutaneous tissue related to radiation, unspecified  Stiffness of right shoulder joint     Problem List Patient Active Problem List   Diagnosis Date Noted  . History of right breast cancer 11/14/2018  . Chemotherapy induced neutropenia ([place]) 09/13/2018  . Port-A-Cath in place 06/10/2018  . Malignant neoplasm of upper-outer quadrant of right breast in female, estrogen receptor negative ([place]) 05/09/2018  . Low iron stores 01/16/2018  . Muscle spasm 01/15/2018  . Piriformis syndrome of left side 01/15/2018  . Grade I hemorrhoids 02/23/2017  . Low lying posterior placenta, antepartum 02/01/2016  . AMA (advanced maternal age) multigravida 35+ 11/22/2015  . 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 Calcification of right breast 02/19/2015  . Ligament tear 07/05/2012   Donato Heinz. Owens Shark PT  Norwood Levo 06/09/2019, 12:43 PM  [place], [place], [address] Phone: [phone]   Fax:  [phone]  Name: ALLESSANDRA SWEAT MRN: [id number] Date of Birth: 09/20/1975

## 2019-06-16 ENCOUNTER — Ambulatory Visit: Payer: BC Managed Care – PPO

## 2019-06-16 ENCOUNTER — Other Ambulatory Visit: Payer: Self-pay

## 2019-06-16 DIAGNOSIS — Z483 Aftercare following surgery for neoplasm: Secondary | ICD-10-CM | POA: Diagnosis not present

## 2019-06-16 DIAGNOSIS — L599 Disorder of the skin and subcutaneous tissue related to radiation, unspecified: Secondary | ICD-10-CM

## 2019-06-16 DIAGNOSIS — M25611 Stiffness of right shoulder, not elsewhere classified: Secondary | ICD-10-CM | POA: Diagnosis not present

## 2019-06-16 DIAGNOSIS — R293 Abnormal posture: Secondary | ICD-10-CM

## 2019-06-16 DIAGNOSIS — I972 Postmastectomy lymphedema syndrome: Secondary | ICD-10-CM | POA: Diagnosis not present

## 2019-06-16 DIAGNOSIS — M25612 Stiffness of left shoulder, not elsewhere classified: Secondary | ICD-10-CM | POA: Diagnosis not present

## 2019-06-16 DIAGNOSIS — M6281 Muscle weakness (generalized): Secondary | ICD-10-CM

## 2019-06-16 DIAGNOSIS — M25511 Pain in right shoulder: Secondary | ICD-10-CM | POA: Diagnosis not present

## 2019-06-16 NOTE — Therapy (Signed)
[place], [place], [address] Phone: [phone]   Fax:  [phone]  Physical Therapy Treatment  Patient Details  Name: Darlene Grant MRN: [id number] Date of Birth: 1975/11/19 Referring Provider (PT): Dr. Nicholas Lose; Dr. Clovia Cuff   Encounter Date: 06/16/2019  PT End of Session - 06/16/19 1107    Visit Number  37    Number of Visits  45    Date for PT Re-Evaluation  06/19/19    PT Start Time  1010    PT Stop Time  1104    PT Time Calculation (min)  54 min    Activity Tolerance  Patient tolerated treatment well    Behavior During Therapy  [facility] for tasks assessed/performed       Past Medical History:  Diagnosis Date  . Abnormal Pap smear    ASCUS 03/13/11  . Anemia   . Breast pain, left   . Cancer ([place])   . Candida vaginitis   . Complication of anesthesia    pt states that all narcotics make her angry and she would prefer to avoid them.  . Rectal itching   . Vaginal Pap smear, abnormal     Past Surgical History:  Procedure Laterality Date  . BREAST BIOPSY Right 8/16  . CESAREAN SECTION N/A 11/21/2013   Procedure: CESAREAN SECTION;  Surgeon: Alwyn Pea, MD;  Location: [facility];  Service: Obstetrics;  Laterality: N/A;  . CESAREAN SECTION N/A 06/23/2016   Procedure: CESAREAN SECTION;  Surgeon: Emily Filbert, MD;  Location: [facility];  Service: Obstetrics;  Laterality: N/A;  . HAND SURGERY  04/2012   ligament repair  . MASTECTOMY WITH AXILLARY LYMPH NODE DISSECTION Bilateral 11/14/2018   Procedure: RIGHT MASTECTOMY WITH RIGHT AXILLARY LYMPH NODE DISSECTION AND LEFT RISK REDUCING MASTECTOMY;  Surgeon: Rolm Bookbinder, MD;  Location: [facility];  Service: General;  Laterality: Bilateral;  LATE ENTRY: Corrected laterality documentation to read right axillary lymph node dissection vs. documented laterality of left.    Marland Kitchen PILONIDAL CYST EXCISION  2000  .  PORTACATH PLACEMENT N/A 05/28/2018   Procedure: INSERTION PORT-A-CATH WITH ULTRASOUND;  Surgeon: Rolm Bookbinder, MD;  Location: [facility];  Service: General;  Laterality: N/A;  . WISDOM TOOTH EXTRACTION  AGE 43    There were no vitals filed for this visit.  Subjective Assessment - 06/16/19 1009    Subjective  I tried a HIIT workout on Saturday that I've been wanting to get back to since I was diagnosed. It went pretty well and I wore my compression tank top that I got last week and my swell spot and that helped thelittle bit of swwelling I felt after.    Pertinent History  breast cancer diagnosed November 2019: triple negative with Ki of 75% She completed chemotherapy 10/18/2018. 11/14/2018: she underwent bilateral mastectomy with 0/13 nodes positive on the right; radiation ended on 02/10/2019    Patient Stated Goals  sleep without pain, exercise without discomfort, carry daughter on left due to radiation on the right breast    Currently in Pain?  No/denies                       [facility] Adult PT Treatment/Exercise - 06/16/19 0001      Manual Therapy   Soft tissue mobilization  with thick massage cream to  posterior shoulder and posterior axilla     Myofascial Release  to right  lateral and anterior chest, tightness  proximal to scar especilly visible in supine with arm overhead .     Manual Lymphatic Drainage (MLD)  in supine for short neck and abdominal scooping, right inguinal nodes and right axillo-inguinal anastamosis, left axillary nodes and anterior interaxillary anastamosis and chest, then to left sidelying for  posterior interaxillay anastamosis being careful not to overstretch radiated skin  Extra time spent on lateral trunk just distal to axilla                PT Short Term Goals - 03/31/19 1246      PT SHORT TERM GOAL #1   Title  independent with initial HEP for left hip    Time  4    Period  Weeks    Status  Achieved      PT SHORT TERM  GOAL #2   Title  understand how to take care of vaginal health to reduce dryness and keep tissue mobility    Period  Weeks    Status  Achieved        PT Long Term Goals - 05/29/19 1008      PT LONG TERM GOAL #5   Title  Patient able to ambulate without left hip pain due to pelvis in correct alignment    Baseline  hip pain improved by 75%, pelvis not able in correct alignment    Time  12    Period  Weeks    Status  On-going      PT LONG TERM GOAL #6   Title  able to lay on left hip for sleeping with minimal to no pain    Baseline  75% better    Time  12    Period  Weeks    Status  On-going      PT LONG TERM GOAL #7   Title  able to hold her child on the left hip due to the radiation soreness on the right breast with pain minimal to none    Baseline  90% better    Time  12    Period  Weeks    Status  On-going            Plan - 06/16/19 1107    Clinical Impression Statement  Pt reports was able to perform one of her HIIT exercises over the weekend and felt that she did have a little increased swelling at Rt lateral trunk but was able to control this with use of her compression tank and swell spot. Continues to report improvement with tightness and swelling at Rt lateral trunk after each session.    Personal Factors and Comorbidities  Other    Comorbidities  recent chemo and radiation    Examination-Activity Limitations  Bathing;Reach Overhead;Caring for Others;Lift;Hygiene/Grooming;Locomotion Level;Sleep    Stability/Clinical Decision Making  Evolving/Moderate complexity    Rehab Potential  Good    PT Frequency  1x / week    PT Duration  8 weeks    PT Treatment/Interventions  ADLs/Self Care Home Management;Therapeutic exercise;Manual techniques;Biofeedback;Electrical Stimulation;Therapeutic activities;Neuromuscular re-education;Patient/family education;Dry needling;Passive range of motion;Spinal Manipulations;Manual lymph drainage;Taping;Compression bandaging;Orthotic  Fit/Training;Iontophoresis 4mg /ml Dexamethasone    PT Next Visit Plan  Pt will need renewal or D/C at next session. Reassess goals and cont manual therapy for Rt upper quadrant tightness and lymphedema. For ortho cont hip and core strength and dry needling.    Consulted and Agree with Plan of Care  Patient       Patient will  benefit from skilled therapeutic intervention in order to improve the following deficits and impairments:  Decreased knowledge of precautions, Decreased knowledge of use of DME, Postural dysfunction, Decreased range of motion, Increased fascial restricitons, Impaired UE functional use, Increased muscle spasms, Impaired perceived functional ability, Pain, Decreased scar mobility, Decreased strength, Increased edema, Difficulty walking, Decreased activity tolerance, Decreased mobility  Visit Diagnosis: Aftercare following surgery for neoplasm  Muscle weakness (generalized)  Acute pain of right shoulder  Postmastectomy lymphedema  Abnormal posture  Disorder of the skin and subcutaneous tissue related to radiation, unspecified  Stiffness of right shoulder joint     Problem List Patient Active Problem List   Diagnosis Date Noted  . History of right breast cancer 11/14/2018  . Chemotherapy induced neutropenia ([place]) 09/13/2018  . Port-A-Cath in place 06/10/2018  . Malignant neoplasm of upper-outer quadrant of right breast in female, estrogen receptor negative ([place]) 05/09/2018  . Low iron stores 01/16/2018  . Muscle spasm 01/15/2018  . Piriformis syndrome of left side 01/15/2018  . Grade I hemorrhoids 02/23/2017  . Low lying posterior placenta, antepartum 02/01/2016  . AMA (advanced maternal age) multigravida 35+ 11/22/2015  . 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 Calcification of right breast 02/19/2015  . Ligament tear 07/05/2012    Otelia Limes, PTA 06/16/2019, 11:35 AM  [place], [place], [address] Phone: [phone]   Fax:  [phone]  Name: QUATISHA HALABY MRN: [id number] Date of Birth: 26-Jun-1975

## 2019-06-19 ENCOUNTER — Ambulatory Visit: Payer: BC Managed Care – PPO | Admitting: Physical Therapy

## 2019-06-19 ENCOUNTER — Other Ambulatory Visit: Payer: Self-pay

## 2019-06-19 ENCOUNTER — Encounter: Payer: Self-pay | Admitting: Physical Therapy

## 2019-06-19 DIAGNOSIS — M25612 Stiffness of left shoulder, not elsewhere classified: Secondary | ICD-10-CM | POA: Diagnosis not present

## 2019-06-19 DIAGNOSIS — M6281 Muscle weakness (generalized): Secondary | ICD-10-CM | POA: Diagnosis not present

## 2019-06-19 DIAGNOSIS — M25552 Pain in left hip: Secondary | ICD-10-CM | POA: Diagnosis not present

## 2019-06-19 DIAGNOSIS — Z483 Aftercare following surgery for neoplasm: Secondary | ICD-10-CM | POA: Diagnosis not present

## 2019-06-19 NOTE — Patient Instructions (Signed)
Access Code: [email or web address]  URL: [email or web address]  Date: 06/19/2019  Prepared by: Earlie Counts   Exercises Seated Quadratus Lumborum Stretch with Arm Overhead - 2 reps                   - 1 sets - 30 sec hold - 1x daily - 7x weekly Seated Piriformis Stretch with Trunk Bend - 2 reps - 1 sets - 30 sec hold - 1x daily - 7x weekly Seated Hamstring Stretch - 2 reps - 1 sets - 30 sec hold - 1x daily - 7x weekly Supine Bridge - 15 reps - 1 sets - 1x daily - 7x weekly Supine Transversus Abdominis Bracing - Hands on Stomach - 10 reps - 1 sets - 5 sec hold - 1x daily - 7x weekly Bent Knee Fallouts - 10 reps - 1 sets - 1x daily - 7x weekly Hip Abduction with Resistance Loop - 10 reps - 1 sets - 1x daily - 7x weekly Standing Hip Extension Kicks - 10 reps - 1 sets - 1x daily - 7x weekly Standing Bilateral Shoulder Scaption Wall Slide - 10 reps - 1 sets - 1x daily - 7x weekly Shoulder Flexion with Anterior Anchored Resistance - 10 reps - 1 sets - 1x daily - 7x weekly Standing High Shoulder Row with Anchored Resistance - 10 reps - 1 sets - 1x daily - 7x weekly Standing Scapular Protraction with Resistance - 10 reps - 2 sets - 1x daily - 7x weekly Thoracic Sidebending on Swiss Ball - 10 reps - 3 sets - 1x daily - 7x weekly Bridge with Hamstring Curl on [employer] - 10 reps - 3 sets - 1x daily - 7x weekly Supine Lower Trunk Rotation with Swiss Ball - 10 reps - 3 sets - 1x daily - 7x weekly Supine Hip and Knee Flexion AROM with Swiss Ball - 10 reps - 3 sets - 1x daily - 7x weekly Supine Bridge with Pelvic Floor Contraction on Swiss Ball - 10 reps - 3 sets - 1x daily - 7x weekly Supine [employer] - 10 reps - 3 sets - 1x daily - 7x weekly Swiss Ball March - 10 reps - 3 sets - 1x daily - 7x weekly Swiss Ball Knee Extension - 10 reps - 3 sets - 1x daily - 7x weekly Alternating Shoulder Flexion Seated on Swiss Ball - 10 reps - 3 sets - 1x daily - 7x weekly Patient Education Trigger [facility]  [facility] [address] [place], [place] [address] Phone # [phone] Fax [phone]

## 2019-06-19 NOTE — Therapy (Signed)
[facility] [address] [place], [place], [address] Phone: [phone]   Fax:  [phone]  Physical Therapy Treatment  Patient Details  Name: Darlene Grant MRN: [id number] Date of Birth: [birth year] Referring Provider (PT): Dr. Nicholas Lose; Dr. Clovia Cuff   Encounter Date: 06/19/2019  PT End of Session - 06/19/19 0939    Visit Number  54    Date for PT Re-Evaluation  06/19/19    Authorization Type  BCBS    PT Start Time  0935    PT Stop Time  1015    PT Time Calculation (min)  40 min    Activity Tolerance  Patient tolerated treatment well    Behavior During Therapy  [facility] for tasks assessed/performed       Past Medical History:  Diagnosis Date  . Abnormal Pap smear    ASCUS 03/13/11  . Anemia   . Breast pain, left   . Cancer ([place])   . Candida vaginitis   . Complication of anesthesia    pt states that all narcotics make her angry and she would prefer to avoid them.  . Rectal itching   . Vaginal Pap smear, abnormal     Past Surgical History:  Procedure Laterality Date  . BREAST BIOPSY Right 8/16  . CESAREAN SECTION N/A 11/21/2013   Procedure: CESAREAN SECTION;  Surgeon: Alwyn Pea, MD;  Location: [facility];  Service: Obstetrics;  Laterality: N/A;  . CESAREAN SECTION N/A 06/23/2016   Procedure: CESAREAN SECTION;  Surgeon: Emily Filbert, MD;  Location: [facility];  Service: Obstetrics;  Laterality: N/A;  . HAND SURGERY  04/2012   ligament repair  . MASTECTOMY WITH AXILLARY LYMPH NODE DISSECTION Bilateral 11/14/2018   Procedure: RIGHT MASTECTOMY WITH RIGHT AXILLARY LYMPH NODE DISSECTION AND LEFT RISK REDUCING MASTECTOMY;  Surgeon: Rolm Bookbinder, MD;  Location: [facility];  Service: General;  Laterality: Bilateral;  LATE ENTRY: Corrected laterality documentation to read right axillary lymph node dissection vs. documented laterality of left.    Marland Kitchen PILONIDAL CYST EXCISION  2000   . PORTACATH PLACEMENT N/A 05/28/2018   Procedure: INSERTION PORT-A-CATH WITH ULTRASOUND;  Surgeon: Rolm Bookbinder, MD;  Location: [facility];  Service: General;  Laterality: N/A;  . WISDOM TOOTH EXTRACTION  AGE 74    There were no vitals filed for this visit.      [facility] PT Assessment - 06/19/19 0001      Assessment   Medical Diagnosis  M25.552 left hip pain; C50.411,Z17.1 Malignant neoplasm of upper-outer quadrant of right breast in female, estrogen receptor negative    Referring Provider (PT)  Dr. Nicholas Lose; Dr. 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     Leisure  likes to walk and exercise regularly, well educated in exercise       Cognition  Overall Cognitive Status  Within Functional Limits for tasks assessed      Posture/Postural Control   Posture/Postural Control  No significant limitations      ROM / Strength   AROM / PROM / Strength  AROM;PROM;Strength      AROM   Right Shoulder Flexion  170 Degrees    Right Shoulder ABduction  150 Degrees    Right Shoulder Internal Rotation  70 Degrees    Right Shoulder External Rotation  90 Degrees    Left Shoulder Flexion  165 Degrees    Left Shoulder ABduction  165 Degrees    Left Shoulder Internal Rotation  75 Degrees    Left Shoulder External Rotation  90 Degrees    Lumbar Extension  full    Lumbar - Right Side Bend  full      Strength   Left Hip Flexion  5/5    Left Hip Extension  5/5    Left Hip Internal Rotation  5/5     Left Hip ABduction  4+/5      Palpation   SI assessment   ASIS are equal                   [facility] Adult PT Treatment/Exercise - 06/19/19 0001      Lumbar Exercises: Stretches   Double Knee to Chest Stretch  2 reps;30 seconds    Double Knee to Chest Stretch Limitations  feet on ball      Lumbar Exercises: Supine   Bridge  10 reps    Bridge Limitations  with feet on ball    Other Supine Lumbar Exercises  bridge with bringing knees to buttocks             PT Education - 06/19/19 1020    Education Details  Access Code: [email or web address]    Person(s) Educated  Patient    Methods  Explanation;Demonstration;Verbal cues;Handout    Comprehension  Returned demonstration;Verbalized understanding       PT Short Term Goals - 03/31/19 1246      PT SHORT TERM GOAL #1   Title  independent with initial HEP for left hip    Time  4    Period  Weeks    Status  Achieved      PT SHORT TERM GOAL #2   Title  understand how to take care of vaginal health to reduce dryness and keep tissue mobility    Period  Weeks    Status  Achieved        PT Long Term Goals - 06/19/19 1021      PT LONG TERM GOAL #5   Title  Patient able to ambulate without left hip pain due to pelvis in correct alignment    Time  12    Period  Weeks    Status  Achieved      PT LONG TERM GOAL #6   Title  able to lay on left hip for sleeping with minimal to no pain    Time  12    Period  Weeks    Status  Achieved      PT LONG TERM GOAL #7   Title  able to hold her child on the left hip due to the radiation soreness on the right breast with pain minimal to none    Time  12    Period  Weeks    Status  Achieved  Plan - 06/19/19 0940    Clinical Impression Statement  Patient has full left shoulder ROM and full left hip strength. Patient pelvis in correct alignment. Patient is able to take walks with minimal to no left hip pain. Patient is able to exercise and understands how to progress  herself. Patient will still attend Cancer rehab to work on swelling in the right axilla and bilateral shoulders.    Personal Factors and Comorbidities  Other    Comorbidities  recent chemo and radiation    Examination-Activity Limitations  Bathing;Reach Overhead;Caring for Others;Lift;Hygiene/Grooming;Locomotion Level;Sleep    Stability/Clinical Decision Making  Evolving/Moderate complexity    Rehab Potential  Good    PT Frequency  --    PT Duration  --    PT Treatment/Interventions  ADLs/Self Care Home Management;Therapeutic exercise;Manual techniques;Biofeedback;Electrical Stimulation;Therapeutic activities;Neuromuscular re-education;Patient/family education;Dry needling;Passive range of motion;Spinal Manipulations;Manual lymph drainage;Taping;Compression bandaging;Orthotic Fit/Training;Iontophoresis '4mg'$ /ml Dexamethasone    PT Next Visit Plan  Discharge to HEP this visit for the orthopedic part. Patient will continue to go to Cancer rehab to address the deficits and swelling for after breast cancer care    PT Home Exercise Plan  Access Code: [email or web address]    Consulted and Agree with Plan of Care  Patient       Patient will benefit from skilled therapeutic intervention in order to improve the following deficits and impairments:  Decreased knowledge of precautions, Decreased knowledge of use of DME, Postural dysfunction, Decreased range of motion, Increased fascial restricitons, Impaired UE functional use, Increased muscle spasms, Impaired perceived functional ability, Pain, Decreased scar mobility, Decreased strength, Increased edema, Difficulty walking, Decreased activity tolerance, Decreased mobility  Visit Diagnosis: Aftercare following surgery for neoplasm  Muscle weakness (generalized)  Stiffness of left shoulder, not elsewhere classified  Pain in left hip     Problem List Patient Active Problem List   Diagnosis Date Noted  . History of right breast cancer 11/14/2018  . Chemotherapy  induced neutropenia ([place]) 09/13/2018  . Port-A-Cath in place 06/10/2018  . Malignant neoplasm of upper-outer quadrant of right breast in female, estrogen receptor negative ([place]) 05/09/2018  . Low iron stores 01/16/2018  . Muscle spasm 01/15/2018  . Piriformis syndrome of left side 01/15/2018  . Grade I hemorrhoids 02/23/2017  . Low lying posterior placenta, antepartum 02/01/2016  . AMA (advanced maternal age) multigravida 35+ 11/22/2015  . 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 Calcification of right breast 02/19/2015  . Ligament tear 07/05/2012    Earlie Counts, PT 06/19/19 10:29 AM   [facility] [address] [place], [place], [address] Phone: [phone]   Fax:  [phone]  Name: KAMONI DEPREE MRN: [id number] Date of Birth: 07/16/1975  PHYSICAL THERAPY DISCHARGE SUMMARY  Visits from Start of Care: 19  Current functional level related to goals / functional outcomes: See above.   Remaining deficits: See above.    Education / Equipment: HEP Plan: Patient agrees to discharge.  Patient goals were met. Patient is being discharged due to meeting the stated rehab goals.  Earlie Counts, PT?????

## 2019-06-23 ENCOUNTER — Other Ambulatory Visit: Payer: Self-pay

## 2019-06-23 ENCOUNTER — Ambulatory Visit: Payer: BC Managed Care – PPO | Attending: General Surgery

## 2019-06-23 ENCOUNTER — Encounter: Payer: Self-pay | Admitting: Hematology and Oncology

## 2019-06-23 DIAGNOSIS — I972 Postmastectomy lymphedema syndrome: Secondary | ICD-10-CM | POA: Diagnosis not present

## 2019-06-23 DIAGNOSIS — Z483 Aftercare following surgery for neoplasm: Secondary | ICD-10-CM | POA: Insufficient documentation

## 2019-06-23 DIAGNOSIS — M25611 Stiffness of right shoulder, not elsewhere classified: Secondary | ICD-10-CM | POA: Diagnosis not present

## 2019-06-23 DIAGNOSIS — M6281 Muscle weakness (generalized): Secondary | ICD-10-CM | POA: Diagnosis not present

## 2019-06-23 DIAGNOSIS — M25612 Stiffness of left shoulder, not elsewhere classified: Secondary | ICD-10-CM | POA: Diagnosis not present

## 2019-06-23 DIAGNOSIS — L599 Disorder of the skin and subcutaneous tissue related to radiation, unspecified: Secondary | ICD-10-CM | POA: Insufficient documentation

## 2019-06-23 NOTE — Therapy (Signed)
[place], [place], [address] Phone: [phone]   Fax:  [phone]  Physical Therapy Treatment  Patient Details  Name: Darlene Grant MRN: [id number] Date of Birth: 11/21/1975 Referring Provider (PT): Dr. Nicholas Lose; Dr. Clovia Cuff   Encounter Date: 06/23/2019  PT End of Session - 06/23/19 1239    Visit Number  39    Number of Visits  36    Date for PT Re-Evaluation  08/18/19    Authorization Type  BCBS    PT Start Time  1005    PT Stop Time  1102    PT Time Calculation (min)  57 min    Activity Tolerance  Patient tolerated treatment well    Behavior During Therapy  [facility] for tasks assessed/performed       Past Medical History:  Diagnosis Date  . Abnormal Pap smear    ASCUS 03/13/11  . Anemia   . Breast pain, left   . Cancer ([place])   . Candida vaginitis   . Complication of anesthesia    pt states that all narcotics make her angry and she would prefer to avoid them.  . Rectal itching   . Vaginal Pap smear, abnormal     Past Surgical History:  Procedure Laterality Date  . BREAST BIOPSY Right 8/16  . CESAREAN SECTION N/A 11/21/2013   Procedure: CESAREAN SECTION;  Surgeon: Alwyn Pea, MD;  Location: [facility];  Service: Obstetrics;  Laterality: N/A;  . CESAREAN SECTION N/A 06/23/2016   Procedure: CESAREAN SECTION;  Surgeon: Emily Filbert, MD;  Location: [facility];  Service: Obstetrics;  Laterality: N/A;  . HAND SURGERY  04/2012   ligament repair  . MASTECTOMY WITH AXILLARY LYMPH NODE DISSECTION Bilateral 11/14/2018   Procedure: RIGHT MASTECTOMY WITH RIGHT AXILLARY LYMPH NODE DISSECTION AND LEFT RISK REDUCING MASTECTOMY;  Surgeon: Rolm Bookbinder, MD;  Location: [facility];  Service: General;  Laterality: Bilateral;  LATE ENTRY: Corrected laterality documentation to read right axillary lymph node dissection vs. documented laterality of left.    Marland Kitchen PILONIDAL CYST  EXCISION  2000  . PORTACATH PLACEMENT N/A 05/28/2018   Procedure: INSERTION PORT-A-CATH WITH ULTRASOUND;  Surgeon: Rolm Bookbinder, MD;  Location: [facility];  Service: General;  Laterality: N/A;  . WISDOM TOOTH EXTRACTION  AGE 53    There were no vitals filed for this visit.  Subjective Assessment - 06/23/19 1014    Subjective  I'm overall feeling much better but I would like to cont PT at this time as I'm transitioning into more exercising at home which is increasing my chest tightness and causes my Rt axilla/trunk swelling to flunctuate. I also have started noticing my Lt shoulder having some tenderness at the front of my shoulder and tightness at the end of my reach after I exercised the last few times.    Pertinent History  breast cancer diagnosed November 2019: triple negative with Ki of 75% She completed chemotherapy 10/18/2018. 11/14/2018: she underwent bilateral mastectomy with 0/13 nodes positive on the right; radiation ended on 02/10/2019    Patient Stated Goals  sleep without pain, exercise without discomfort, carry daughter on left due to radiation on the right breast    Currently in Pain?  Yes    Pain Score  2     Pain Location  Shoulder    Pain Orientation  Left    Pain Descriptors / Indicators  Dull;Sore    Pain  Type  Acute pain    Pain Onset  In the past 7 days    Pain Frequency  Intermittent    Aggravating Factors   jumping jacks over the weekend    Pain Relieving Factors  rest                       [facility] Adult PT Treatment/Exercise - 06/23/19 0001      Manual Therapy   Soft tissue mobilization  to Lt anterior shoulder joint capsule, pt tender to palpation here but this seemed some improved by end of session    Myofascial Release  to right lateral and anterior chest and axilla, tightness  proximal to scar especilly visible in supine with arm overhead . ; then to Lt axilla at end P/ROM where pt reports feeling tightness today    Manual  Lymphatic Drainage (MLD)  in supine for short neck and superficial and deep abdominals, right inguinal nodes and right axillo-inguinal anastamosis, left axillary nodes and anterior interaxillary anastamosis and chest, then to left sidelying for  posterior interaxillay anastamosis being careful not to overstretch radiated skin  Extra time spent on lateral trunk just distal to axilla     Passive ROM  To bil shoulders into flexion, abduction and D2 to pts end ROM with myofascial release               PT Short Term Goals - 03/31/19 1246      PT SHORT TERM GOAL #1   Title  independent with initial HEP for left hip    Time  4    Period  Weeks    Status  Achieved      PT SHORT TERM GOAL #2   Title  understand how to take care of vaginal health to reduce dryness and keep tissue mobility    Period  Weeks    Status  Achieved        PT Long Term Goals - 06/23/19 1259      PT LONG TERM GOAL #1   Title  --    Baseline  --    Status  --            Plan - 06/23/19 1242    Clinical Impression Statement  Pt presents to PT today for continued work on her Rt lateral trunk lymphedema that is fluctuating as she is working to progress her exercise routine to pre-cancer. She has also started noticing some incresaed Lt shoulder pain/tightness and is tender to palpation at anterior sholder joint today. Pt will benefit from continued therapy at this time to help her better manage lymphedema symptoms at Rt lateral trunk as she progresses her exercise routine. Also pt will benefit from furthering her HEP to include proper technique training to decrease irritation she has been feeling at Lt shoulder as of late. Pt is agreeable to cont 1x/wk for Jan and potential 2x/month in February working towards independence without flare up of afore mentioned symptoms.    Personal Factors and Comorbidities  Other    Comorbidities  recent chemo and radiation    Examination-Activity Limitations  Bathing;Reach  Overhead;Caring for Others;Lift;Hygiene/Grooming;Locomotion Level;Sleep    Stability/Clinical Decision Making  Evolving/Moderate complexity    Rehab Potential  Good    PT Frequency  1x / week    PT Duration  8 weeks    PT Treatment/Interventions  ADLs/Self Care Home Management;Therapeutic exercise;Manual techniques;Biofeedback;Electrical Stimulation;Therapeutic activities;Neuromuscular re-education;Patient/family education;Dry needling;Passive range of motion;Spinal Manipulations;Manual  lymph drainage;Taping;Compression bandaging;Orthotic Fit/Training;Iontophoresis 4mg /ml Dexamethasone    PT Next Visit Plan  Renewal done this visit for Cancer Rehab. Instruct pt in scapular series with back against ball on wall focusing on correct technique/scapular rhythm bilaterally; continue with manual therapy to bil upper quadrants adressing Rt trunk lymph edema, bil chest tightness and myofascial release to bil axillae, also STM prn to Lt shoulder as done today.    Consulted and Agree with Plan of Care  Patient       Patient will benefit from skilled therapeutic intervention in order to improve the following deficits and impairments:  Decreased knowledge of precautions, Decreased knowledge of use of DME, Postural dysfunction, Decreased range of motion, Increased fascial restricitons, Impaired UE functional use, Increased muscle spasms, Impaired perceived functional ability, Pain, Decreased scar mobility, Decreased strength, Increased edema, Difficulty walking, Decreased activity tolerance, Decreased mobility  Visit Diagnosis: Aftercare following surgery for neoplasm  Muscle weakness (generalized)  Stiffness of left shoulder, not elsewhere classified  Postmastectomy lymphedema  Disorder of the skin and subcutaneous tissue related to radiation, unspecified  Stiffness of right shoulder joint     Problem List Patient Active Problem List   Diagnosis Date Noted  . History of right breast cancer  11/14/2018  . Chemotherapy induced neutropenia ([place]) 09/13/2018  . Port-A-Cath in place 06/10/2018  . Malignant neoplasm of upper-outer quadrant of right breast in female, estrogen receptor negative ([place]) 05/09/2018  . Low iron stores 01/16/2018  . Muscle spasm 01/15/2018  . Piriformis syndrome of left side 01/15/2018  . Grade I hemorrhoids 02/23/2017  . Low lying posterior placenta, antepartum 02/01/2016  . AMA (advanced maternal age) multigravida 35+ 11/22/2015  . 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 Calcification of right breast 02/19/2015  . Ligament tear 07/05/2012    Otelia Limes, PTA 06/23/2019, 4:33 PM  [place], [place], [address] Phone: [phone]   Fax:  [phone]  Name: RAUL BROADUS MRN: [id number] Date of Birth: 12/27/1975

## 2019-06-25 NOTE — Addendum Note (Signed)
Addended by: Kipp Laurence on: 06/25/2019 11:37 AM   Modules accepted: Orders

## 2019-06-27 NOTE — Progress Notes (Signed)
I have reviewed this chart and agree with the RN/CMA assessment and management.    Klarisa Barman C Yamil Dougher, MD, FACOG Attending Physician, Faculty [facility]  

## 2019-06-30 ENCOUNTER — Ambulatory Visit: Payer: BC Managed Care – PPO

## 2019-06-30 ENCOUNTER — Other Ambulatory Visit: Payer: Self-pay

## 2019-06-30 DIAGNOSIS — I972 Postmastectomy lymphedema syndrome: Secondary | ICD-10-CM

## 2019-06-30 DIAGNOSIS — M25612 Stiffness of left shoulder, not elsewhere classified: Secondary | ICD-10-CM

## 2019-06-30 DIAGNOSIS — M25611 Stiffness of right shoulder, not elsewhere classified: Secondary | ICD-10-CM

## 2019-06-30 DIAGNOSIS — Z483 Aftercare following surgery for neoplasm: Secondary | ICD-10-CM

## 2019-06-30 DIAGNOSIS — M6281 Muscle weakness (generalized): Secondary | ICD-10-CM

## 2019-06-30 DIAGNOSIS — L599 Disorder of the skin and subcutaneous tissue related to radiation, unspecified: Secondary | ICD-10-CM

## 2019-06-30 NOTE — Therapy (Signed)
[place], [place], [address] Phone: [phone]   Fax:  [phone]  Physical Therapy Treatment  Patient Details  Name: Darlene Grant MRN: [id number] Date of Birth: 1975/10/22 Referring Provider (PT): Dr. Nicholas Lose; Dr. Clovia Cuff   Encounter Date: 06/30/2019  PT End of Session - 06/30/19 1059    Visit Number  40    Number of Visits  21    Date for PT Re-Evaluation  08/18/19    Authorization Type  [employer]    PT Start Time  1005    PT Stop Time  1100    PT Time Calculation (min)  55 min    Activity Tolerance  Patient tolerated treatment well    Behavior During Therapy  [facility] for tasks assessed/performed       Past Medical History:  Diagnosis Date  . Abnormal Pap smear    ASCUS 03/13/11  . Anemia   . Breast pain, left   . Cancer ([place])   . Candida vaginitis   . Complication of anesthesia    pt states that all narcotics make her angry and she would prefer to avoid them.  . Rectal itching   . Vaginal Pap smear, abnormal     Past Surgical History:  Procedure Laterality Date  . BREAST BIOPSY Right 8/16  . CESAREAN SECTION N/A 11/21/2013   Procedure: CESAREAN SECTION;  Surgeon: Alwyn Pea, MD;  Location: [facility];  Service: Obstetrics;  Laterality: N/A;  . CESAREAN SECTION N/A 06/23/2016   Procedure: CESAREAN SECTION;  Surgeon: Emily Filbert, MD;  Location: [place];  Service: Obstetrics;  Laterality: N/A;  . HAND SURGERY  04/2012   ligament repair  . MASTECTOMY WITH AXILLARY LYMPH NODE DISSECTION Bilateral 11/14/2018   Procedure: RIGHT MASTECTOMY WITH RIGHT AXILLARY LYMPH NODE DISSECTION AND LEFT RISK REDUCING MASTECTOMY;  Surgeon: Rolm Bookbinder, MD;  Location: [facility];  Service: General;  Laterality: Bilateral;  LATE ENTRY: Corrected laterality documentation to read right axillary lymph node dissection vs. documented laterality of left.    Marland Kitchen PILONIDAL  CYST EXCISION  2000  . PORTACATH PLACEMENT N/A 05/28/2018   Procedure: INSERTION PORT-A-CATH WITH ULTRASOUND;  Surgeon: Rolm Bookbinder, MD;  Location: [place];  Service: General;  Laterality: N/A;  . WISDOM TOOTH EXTRACTION  AGE 44    There were no vitals filed for this visit.  Subjective Assessment - 06/30/19 1015    Subjective  I started noticing some tightness running down my arm yesterday that kind of feels like what the cording used to feel like but not as intense. I had to pick my daughter up quickly Saturday night after hse had an accident and I was rushing her to the bathroom so I guess that could have been what did it. I also had worked out for about an hour the day before so who knows.    Pertinent History  breast cancer diagnosed November 2019: triple negative with Ki of 75% She completed chemotherapy 10/18/2018. 11/14/2018: she underwent bilateral mastectomy with 0/13 nodes positive on the right; radiation ended on 02/10/2019    Patient Stated Goals  sleep without pain, exercise without discomfort, carry daughter on left due to radiation on the right breast    Currently in Pain?  No/denies   just tightness down Rt arm                      [facility] Adult  PT Treatment/Exercise - 06/30/19 0001      Manual Therapy   Myofascial Release  to right lateral and anterior chest and axilla, tightness  proximal to scar especilly visible in supine with arm overhead . ; then to Lt axilla at end P/ROM where pt reports feeling tightness today    Manual Lymphatic Drainage (MLD)  in supine for short neck and superficial and deep abdominals, right inguinal nodes and right axillo-inguinal anastamosis, left axillary nodes and anterior interaxillary anastamosis and chest, then to left sidelying for  posterior interaxillay anastamosis being careful not to overstretch radiated skin  Extra time spent on lateral trunk just distal to axilla     Passive ROM  To Rt shoulder into  flexion, abduction and D2    Neural Stretch  To Rt UE  where pt c/o tightness             PT Education - 06/30/19 1219    Education Details  Supine scapular series    Person(s) Educated  Patient    Methods  Explanation;Handout    Comprehension  Verbalized understanding       PT Short Term Goals - 03/31/19 1246      PT SHORT TERM GOAL #1   Title  independent with initial HEP for left hip    Time  4    Period  Weeks    Status  Achieved      PT SHORT TERM GOAL #2   Title  understand how to take care of vaginal health to reduce dryness and keep tissue mobility    Period  Weeks    Status  Achieved        PT Long Term Goals - 06/25/19 1130      PT LONG TERM GOAL #1   Title  Pt will have 165 degrees of painfree right shoulder abduction so that she can perform her household chores and exercises easier    Baseline  108 with pain; 164 degrees without pain-01/15/19; 150 degrees-06/19/19    Time  8    Period  Weeks    Status  Revised      PT LONG TERM GOAL #2   Title  Pt will report 75% decrease in bil shoulder and chest tightness after exercising    Baseline  Feels moderate tightness across chest and in bil shoulders after exercising at home-06/23/19    Time  8    Period  Weeks    Status  Revised      PT LONG TERM GOAL #3   Title  Pt will be independent in a home exercise program for shoulder ROM and strength    Baseline  still learning; pt is independent with HEP and is slowly getting back to her regular workout routine at home-06/23/19    Time  8    Period  Weeks    Status  On-going      PT LONG TERM GOAL #4   Title  Pt to report less tightness with OH reaching by at least 50% at Lt axilla.    Baseline  75% at this time-06/23/19    Time  8    Period  Weeks    Status  Achieved            Plan - 06/30/19 1100    Clinical Impression Statement  Pt came in reporting some cording type symptoms into Rt UE so focused on manual therapy with manual lymph drainage of Rt  UE, myofascial release,  and Rt shoulder P/ROM. Visible puckering superior to incision at Rt chest wall at beginning of session that did flatten well to almost gone by end of session. Educated pt about this during session and how to cont myofascial release over next week to decrease cording that she is feeling. Pt verbalized understanding all and reports much less tightness in UE at end of session. Reissued handout for supine scapular series per pt request, she has been instructed in this in previous episode.    Personal Factors and Comorbidities  Other    Comorbidities  recent chemo and radiation    Examination-Activity Limitations  Bathing;Reach Overhead;Caring for Others;Lift;Hygiene/Grooming;Locomotion Level;Sleep    Stability/Clinical Decision Making  Evolving/Moderate complexity    Rehab Potential  Good    PT Frequency  1x / week    PT Duration  8 weeks    PT Treatment/Interventions  ADLs/Self Care Home Management;Therapeutic exercise;Manual techniques;Biofeedback;Electrical Stimulation;Therapeutic activities;Neuromuscular re-education;Patient/family education;Dry needling;Passive range of motion;Spinal Manipulations;Manual lymph drainage;Taping;Compression bandaging;Orthotic Fit/Training;Iontophoresis 4mg /ml Dexamethasone    PT Next Visit Plan  Review supine scapular series prn assessing technique for correct scapular rhythm (issued handout today at pt request as she has been instructed in this at previous sessions); continue with manual therapy to bil upper quadrants adressing Rt trunk lymph edema, bil chest tightness and myofascial release to bil axillae, also STM prn to Lt shoulder as done today.    Consulted and Agree with Plan of Care  Patient       Patient will benefit from skilled therapeutic intervention in order to improve the following deficits and impairments:  Decreased knowledge of precautions, Decreased knowledge of use of DME, Postural dysfunction, Decreased range of motion,  Increased fascial restricitons, Impaired UE functional use, Increased muscle spasms, Impaired perceived functional ability, Pain, Decreased scar mobility, Decreased strength, Increased edema, Difficulty walking, Decreased activity tolerance, Decreased mobility  Visit Diagnosis: Aftercare following surgery for neoplasm  Muscle weakness (generalized)  Stiffness of left shoulder, not elsewhere classified  Postmastectomy lymphedema  Disorder of the skin and subcutaneous tissue related to radiation, unspecified  Stiffness of right shoulder joint     Problem List Patient Active Problem List   Diagnosis Date Noted  . History of right breast cancer 11/14/2018  . Chemotherapy induced neutropenia ([place]) 09/13/2018  . Port-A-Cath in place 06/10/2018  . Malignant neoplasm of upper-outer quadrant of right breast in female, estrogen receptor negative ([place]) 05/09/2018  . Low iron stores 01/16/2018  . Muscle spasm 01/15/2018  . Piriformis syndrome of left side 01/15/2018  . Grade I hemorrhoids 02/23/2017  . Low lying posterior placenta, antepartum 02/01/2016  . AMA (advanced maternal age) multigravida 35+ 11/22/2015  . 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 Calcification of right breast 02/19/2015  . Ligament tear 07/05/2012    Otelia Limes, PTA 06/30/2019, 12:23 PM  [place], [place], [address] Phone: [phone]   Fax:  [phone]  Name: Darlene Grant MRN: [id number] Date of Birth: May 23, 1976

## 2019-06-30 NOTE — Patient Instructions (Signed)

## 2019-07-07 ENCOUNTER — Encounter: Payer: Self-pay | Admitting: Physical Therapy

## 2019-07-07 ENCOUNTER — Other Ambulatory Visit: Payer: Self-pay

## 2019-07-07 ENCOUNTER — Ambulatory Visit: Payer: BC Managed Care – PPO | Admitting: Physical Therapy

## 2019-07-07 DIAGNOSIS — M6281 Muscle weakness (generalized): Secondary | ICD-10-CM | POA: Diagnosis not present

## 2019-07-07 DIAGNOSIS — L599 Disorder of the skin and subcutaneous tissue related to radiation, unspecified: Secondary | ICD-10-CM

## 2019-07-07 DIAGNOSIS — I972 Postmastectomy lymphedema syndrome: Secondary | ICD-10-CM | POA: Diagnosis not present

## 2019-07-07 DIAGNOSIS — M25611 Stiffness of right shoulder, not elsewhere classified: Secondary | ICD-10-CM | POA: Diagnosis not present

## 2019-07-07 DIAGNOSIS — Z483 Aftercare following surgery for neoplasm: Secondary | ICD-10-CM | POA: Diagnosis not present

## 2019-07-07 DIAGNOSIS — M25612 Stiffness of left shoulder, not elsewhere classified: Secondary | ICD-10-CM | POA: Diagnosis not present

## 2019-07-07 NOTE — Therapy (Signed)
[place], [place], [address] Phone: [phone]   Fax:  [phone]  Physical Therapy Treatment  Patient Details  Name: Darlene Grant MRN: [id number] Date of Birth: [birth year] Referring Provider (PT): Dr. Nicholas Lose; Dr. Clovia Cuff   Encounter Date: 07/07/2019  PT End of Session - 07/07/19 1512    Visit Number  41    Number of Visits  66    Date for PT Re-Evaluation  08/18/19    PT Start Time  1000    PT Stop Time  1100    PT Time Calculation (min)  60 min    Activity Tolerance  Patient tolerated treatment well    Behavior During Therapy  [facility] for tasks assessed/performed       Past Medical History:  Diagnosis Date  . Abnormal Pap smear    ASCUS 03/13/11  . Anemia   . Breast pain, left   . Cancer ([place])   . Candida vaginitis   . Complication of anesthesia    pt states that all narcotics make her angry and she would prefer to avoid them.  . Rectal itching   . Vaginal Pap smear, abnormal     Past Surgical History:  Procedure Laterality Date  . BREAST BIOPSY Right 8/16  . CESAREAN SECTION N/A 11/21/2013   Procedure: CESAREAN SECTION;  Surgeon: Alwyn Pea, MD;  Location: [facility];  Service: Obstetrics;  Laterality: N/A;  . CESAREAN SECTION N/A 06/23/2016   Procedure: CESAREAN SECTION;  Surgeon: Emily Filbert, MD;  Location: [place];  Service: Obstetrics;  Laterality: N/A;  . HAND SURGERY  04/2012   ligament repair  . MASTECTOMY WITH AXILLARY LYMPH NODE DISSECTION Bilateral 11/14/2018   Procedure: RIGHT MASTECTOMY WITH RIGHT AXILLARY LYMPH NODE DISSECTION AND LEFT RISK REDUCING MASTECTOMY;  Surgeon: Rolm Bookbinder, MD;  Location: [place] [facility];  Service: General;  Laterality: Bilateral;  LATE ENTRY: Corrected laterality documentation to read right axillary lymph node dissection vs. documented laterality of left.    Marland Kitchen PILONIDAL CYST EXCISION  2000  .  PORTACATH PLACEMENT N/A 05/28/2018   Procedure: INSERTION PORT-A-CATH WITH ULTRASOUND;  Surgeon: Rolm Bookbinder, MD;  Location: [facility];  Service: General;  Laterality: N/A;  . WISDOM TOOTH EXTRACTION  AGE 44    There were no vitals filed for this visit.  Subjective Assessment - 07/07/19 1506    Subjective  The cording is back.    Pertinent History  breast cancer diagnosed November 2019: triple negative with Ki of 75% She completed chemotherapy 10/18/2018. 11/14/2018: she underwent bilateral mastectomy with 0/13 nodes positive on the right; radiation ended on 02/10/2019    Patient Stated Goals  sleep without pain, exercise without discomfort, carry daughter on left due to radiation on the right breast    Currently in Pain?  Yes    Pain Score  --   did not rate, but reports tightness in her right axilla, lat chest                      [facility] Adult PT Treatment/Exercise - 07/07/19 0001      Exercises   Exercises  Shoulder      Shoulder Exercises: Standing   Other Standing Exercises  acitve shoulder flexion bilaterally, then 2 x 20 reps of trunk twist to left, then flexion again with noted improvement of les discomfort, then did 5 reps of trunk rot to left with  10 sec holds, with again slight improvement noted Pt instructed to use these techniques to self manage symptoms at home       Manual Therapy   Soft tissue mobilization  anound cording at lateral scar to decrease adhesions and prolonged pressue at tender spots at subscapularis muscles     Myofascial Release  to right lateral and anterior chest and axilla, tightness  proximal to scar especilly visible in supine with arm overhead . ; then to Lt axilla at end P/ROM where pt reports feeling tightness today             PT Education - 07/07/19 1511    Education Details  self evaluation of pain with shoudler movment, then trunk twists with variation and restest to see is discomfort improved     Person(s) Educated  Patient    Methods  Explanation;Demonstration    Comprehension  Verbalized understanding;Returned demonstration       PT Short Term Goals - 03/31/19 1246      PT SHORT TERM GOAL #1   Title  independent with initial HEP for left hip    Time  4    Period  Weeks    Status  Achieved      PT SHORT TERM GOAL #2   Title  understand how to take care of vaginal health to reduce dryness and keep tissue mobility    Period  Weeks    Status  Achieved        PT Long Term Goals - 06/25/19 1130      PT LONG TERM GOAL #1   Title  Pt will have 165 degrees of painfree right shoulder abduction so that she can perform her household chores and exercises easier    Baseline  108 with pain; 164 degrees without pain-01/15/19; 150 degrees-06/19/19    Time  8    Period  Weeks    Status  Revised      PT LONG TERM GOAL #2   Title  Pt will report 75% decrease in bil shoulder and chest tightness after exercising    Baseline  Feels moderate tightness across chest and in bil shoulders after exercising at home-06/23/19    Time  8    Period  Weeks    Status  Revised      PT LONG TERM GOAL #3   Title  Pt will be independent in a home exercise program for shoulder ROM and strength    Baseline  still learning; pt is independent with HEP and is slowly getting back to her regular workout routine at home-06/23/19    Time  8    Period  Weeks    Status  On-going      PT LONG TERM GOAL #4   Title  Pt to report less tightness with OH reaching by at least 50% at Lt axilla.    Baseline  75% at this time-06/23/19    Time  8    Period  Weeks    Status  Achieved            Plan - 07/07/19 1512    Clinical Impression Statement  Pt very discouraged that she has had recurrance of cording and associated aching and discomfort.  She has been wearing her compression sleeve and feels that she gets relief with manual treatment.  Upgraded some exercise that she could do at home.  Pt may be interested in  [facility]    Comorbidities  recent chemo and  radiation    Examination-Activity Limitations  Bathing;Reach Overhead;Caring for Others;Lift;Hygiene/Grooming;Locomotion Level;Sleep    Stability/Clinical Decision Making  Evolving/Moderate complexity    Rehab Potential  Good    PT Frequency  1x / week    PT Duration  8 weeks    PT Next Visit Plan  Review supine scapular series prn assessing technique for correct scapular rhythm (issued handout today at pt request as she has been instructed in this at previous sessions); continue with manual therapy to bil upper quadrants adressing Rt trunk lymph edema, bil chest tightness and myofascial release to bil axillae, also STM prn to Lt shoulder as done today.    Consulted and Agree with Plan of Care  Patient       Patient will benefit from skilled therapeutic intervention in order to improve the following deficits and impairments:  Decreased knowledge of precautions, Decreased knowledge of use of DME, Postural dysfunction, Decreased range of motion, Increased fascial restricitons, Impaired UE functional use, Increased muscle spasms, Impaired perceived functional ability, Pain, Decreased scar mobility, Decreased strength, Increased edema, Difficulty walking, Decreased activity tolerance, Decreased mobility  Visit Diagnosis: Aftercare following surgery for neoplasm  Muscle weakness (generalized)  Postmastectomy lymphedema  Disorder of the skin and subcutaneous tissue related to radiation, unspecified  Stiffness of right shoulder joint     Problem List Patient Active Problem List   Diagnosis Date Noted  . History of right breast cancer 11/14/2018  . Chemotherapy induced neutropenia ([place]) 09/13/2018  . Port-A-Cath in place 06/10/2018  . Malignant neoplasm of upper-outer quadrant of right breast in female, estrogen receptor negative ([place]) 05/09/2018  . Low iron stores 01/16/2018  . Muscle spasm 01/15/2018  . Piriformis syndrome of left side 01/15/2018   . Grade I hemorrhoids 02/23/2017  . Low lying posterior placenta, antepartum 02/01/2016  . AMA (advanced maternal age) multigravida 35+ 11/22/2015  . 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 Calcification of right breast 02/19/2015  . Ligament tear 07/05/2012   Donato Heinz. Owens Shark PT  Norwood Levo 07/07/2019, 3:15 PM  [place], [place], [address] Phone: [phone]   Fax:  [phone]  Name: Darlene Grant MRN: [id number] Date of Birth: 1976-02-28

## 2019-07-14 ENCOUNTER — Ambulatory Visit: Payer: BC Managed Care – PPO | Admitting: Physical Therapy

## 2019-07-14 ENCOUNTER — Other Ambulatory Visit: Payer: Self-pay

## 2019-07-14 ENCOUNTER — Encounter: Payer: Self-pay | Admitting: Physical Therapy

## 2019-07-14 DIAGNOSIS — M6281 Muscle weakness (generalized): Secondary | ICD-10-CM

## 2019-07-14 DIAGNOSIS — L599 Disorder of the skin and subcutaneous tissue related to radiation, unspecified: Secondary | ICD-10-CM | POA: Diagnosis not present

## 2019-07-14 DIAGNOSIS — I972 Postmastectomy lymphedema syndrome: Secondary | ICD-10-CM | POA: Diagnosis not present

## 2019-07-14 DIAGNOSIS — M25611 Stiffness of right shoulder, not elsewhere classified: Secondary | ICD-10-CM

## 2019-07-14 DIAGNOSIS — M25612 Stiffness of left shoulder, not elsewhere classified: Secondary | ICD-10-CM | POA: Diagnosis not present

## 2019-07-14 DIAGNOSIS — Z483 Aftercare following surgery for neoplasm: Secondary | ICD-10-CM

## 2019-07-14 NOTE — Therapy (Signed)
[place], [place], [address] Phone: [phone]   Fax:  [phone]  Physical Therapy Treatment  Patient Details  Name: Darlene Grant MRN: [id number] Date of Birth: [birth year] Referring Provider (PT): Dr. Nicholas Lose; Dr. Clovia Cuff   Encounter Date: 07/14/2019  PT End of Session - 07/14/19 1212    Visit Number  42    Number of Visits  29    Date for PT Re-Evaluation  08/18/19    PT Start Time  1003    PT Stop Time  1057    PT Time Calculation (min)  54 min    Activity Tolerance  Patient tolerated treatment well    Behavior During Therapy  [facility] for tasks assessed/performed       Past Medical History:  Diagnosis Date  . Abnormal Pap smear    ASCUS 03/13/11  . Anemia   . Breast pain, left   . Cancer ([place])   . Candida vaginitis   . Complication of anesthesia    pt states that all narcotics make her angry and she would prefer to avoid them.  . Rectal itching   . Vaginal Pap smear, abnormal     Past Surgical History:  Procedure Laterality Date  . BREAST BIOPSY Right 8/16  . CESAREAN SECTION N/A 11/21/2013   Procedure: CESAREAN SECTION;  Surgeon: Alwyn Pea, MD;  Location: [facility];  Service: Obstetrics;  Laterality: N/A;  . CESAREAN SECTION N/A 06/23/2016   Procedure: CESAREAN SECTION;  Surgeon: Emily Filbert, MD;  Location: [place];  Service: Obstetrics;  Laterality: N/A;  . HAND SURGERY  04/2012   ligament repair  . MASTECTOMY WITH AXILLARY LYMPH NODE DISSECTION Bilateral 11/14/2018   Procedure: RIGHT MASTECTOMY WITH RIGHT AXILLARY LYMPH NODE DISSECTION AND LEFT RISK REDUCING MASTECTOMY;  Surgeon: Rolm Bookbinder, MD;  Location: [place];  Service: General;  Laterality: Bilateral;  LATE ENTRY: Corrected laterality documentation to read right axillary lymph node dissection vs. documented laterality of left.    Marland Kitchen PILONIDAL CYST EXCISION  2000  .  PORTACATH PLACEMENT N/A 05/28/2018   Procedure: INSERTION PORT-A-CATH WITH ULTRASOUND;  Surgeon: Rolm Bookbinder, MD;  Location: [facility];  Service: General;  Laterality: N/A;  . WISDOM TOOTH EXTRACTION  AGE 44    There were no vitals filed for this visit.  Subjective Assessment - 07/14/19 1008    Subjective  Pt feels her coding is much better. She is doing better. She has increased her exercise and included some HIIT training.    Pertinent History  breast cancer diagnosed November 2019: triple negative with Ki of 75% She completed chemotherapy 10/18/2018. 11/14/2018: she underwent bilateral mastectomy with 0/13 nodes positive on the right; radiation ended on 02/10/2019    Patient Stated Goals  sleep without pain, exercise without discomfort, carry daughter on left due to radiation on the right breast    Currently in Pain?  No/denies                       [facility] Adult PT Treatment/Exercise - 07/14/19 0001      Manual Therapy   Soft tissue mobilization  with thick massage cream to pec major and  posterior axilla on the right in supien and left sidelying, then to riht sidelying for soft tissue work to left posterior axilla, posterior shoulder and upper trap    Myofascial Release  to right lateral and anterior chest  and axilla, tightness  proximal to scar especilly visible in supine with arm overhead .               PT Short Term Goals - 03/31/19 1246      PT SHORT TERM GOAL #1   Title  independent with initial HEP for left hip    Time  4    Period  Weeks    Status  Achieved      PT SHORT TERM GOAL #2   Title  understand how to take care of vaginal health to reduce dryness and keep tissue mobility    Period  Weeks    Status  Achieved        PT Long Term Goals - 06/25/19 1130      PT LONG TERM GOAL #1   Title  Pt will have 165 degrees of painfree right shoulder abduction so that she can perform her household chores and exercises easier     Baseline  108 with pain; 164 degrees without pain-01/15/19; 150 degrees-06/19/19    Time  8    Period  Weeks    Status  Revised      PT LONG TERM GOAL #2   Title  Pt will report 75% decrease in bil shoulder and chest tightness after exercising    Baseline  Feels moderate tightness across chest and in bil shoulders after exercising at home-06/23/19    Time  8    Period  Weeks    Status  Revised      PT LONG TERM GOAL #3   Title  Pt will be independent in a home exercise program for shoulder ROM and strength    Baseline  still learning; pt is independent with HEP and is slowly getting back to her regular workout routine at home-06/23/19    Time  8    Period  Weeks    Status  On-going      PT LONG TERM GOAL #4   Title  Pt to report less tightness with OH reaching by at least 50% at Lt axilla.    Baseline  75% at this time-06/23/19    Time  8    Period  Weeks    Status  Achieved            Plan - 07/14/19 1213    Clinical Impression Statement  Pt reports feeling much better at end of session with increased looseness in both shoulders    Comorbidities  recent chemo and radiation    Examination-Activity Limitations  Bathing;Reach Overhead;Caring for Others;Lift;Hygiene/Grooming;Locomotion Level;Sleep    Rehab Potential  Good    PT Frequency  1x / week    PT Duration  8 weeks    PT Treatment/Interventions  ADLs/Self Care Home Management;Therapeutic exercise;Manual techniques;Biofeedback;Electrical Stimulation;Therapeutic activities;Neuromuscular re-education;Patient/family education;Dry needling;Passive range of motion;Spinal Manipulations;Manual lymph drainage;Taping;Compression bandaging;Orthotic Fit/Training;Iontophoresis 4mg /ml Dexamethasone    PT Next Visit Plan  continue with manual therapy to bil upper quadrants adressing Rt trunk lymph edema, bil chest tightness and myofascial release to bil axillae, also STM prn to Lt shoulder as done today.    Consulted and Agree with Plan of  Care  Patient       Patient will benefit from skilled therapeutic intervention in order to improve the following deficits and impairments:  Decreased knowledge of precautions, Decreased knowledge of use of DME, Postural dysfunction, Decreased range of motion, Increased fascial restricitons, Impaired UE functional use, Increased muscle spasms, Impaired perceived functional ability,  Pain, Decreased scar mobility, Decreased strength, Increased edema, Difficulty walking, Decreased activity tolerance, Decreased mobility  Visit Diagnosis: Aftercare following surgery for neoplasm  Muscle weakness (generalized)  Postmastectomy lymphedema  Disorder of the skin and subcutaneous tissue related to radiation, unspecified  Stiffness of right shoulder joint  Stiffness of left shoulder, not elsewhere classified     Problem List Patient Active Problem List   Diagnosis Date Noted  . History of right breast cancer 11/14/2018  . Chemotherapy induced neutropenia ([place]) 09/13/2018  . Port-A-Cath in place 06/10/2018  . Malignant neoplasm of upper-outer quadrant of right breast in female, estrogen receptor negative ([place]) 05/09/2018  . Low iron stores 01/16/2018  . Muscle spasm 01/15/2018  . Piriformis syndrome of left side 01/15/2018  . Grade I hemorrhoids 02/23/2017  . Low lying posterior placenta, antepartum 02/01/2016  . AMA (advanced maternal age) multigravida 35+ 11/22/2015  . 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 Calcification of right breast 02/19/2015  . Ligament tear 07/05/2012   Donato Heinz. Owens Shark PT  Norwood Levo 07/14/2019, 12:16 PM  [place], [place], [address] Phone: [phone]   Fax:  [phone]  Name: KIERSTYNN JOE MRN: [id number] Date of Birth: 10/22/1975

## 2019-07-28 ENCOUNTER — Ambulatory Visit: Payer: BC Managed Care – PPO | Attending: General Surgery | Admitting: Physical Therapy

## 2019-07-28 ENCOUNTER — Other Ambulatory Visit: Payer: Self-pay

## 2019-07-28 ENCOUNTER — Encounter: Payer: Self-pay | Admitting: Physical Therapy

## 2019-07-28 DIAGNOSIS — R293 Abnormal posture: Secondary | ICD-10-CM | POA: Insufficient documentation

## 2019-07-28 DIAGNOSIS — Z483 Aftercare following surgery for neoplasm: Secondary | ICD-10-CM | POA: Diagnosis not present

## 2019-07-28 DIAGNOSIS — M25611 Stiffness of right shoulder, not elsewhere classified: Secondary | ICD-10-CM | POA: Diagnosis not present

## 2019-07-28 DIAGNOSIS — M6281 Muscle weakness (generalized): Secondary | ICD-10-CM | POA: Diagnosis not present

## 2019-07-28 DIAGNOSIS — L599 Disorder of the skin and subcutaneous tissue related to radiation, unspecified: Secondary | ICD-10-CM | POA: Insufficient documentation

## 2019-07-28 DIAGNOSIS — M25511 Pain in right shoulder: Secondary | ICD-10-CM | POA: Insufficient documentation

## 2019-07-28 DIAGNOSIS — I972 Postmastectomy lymphedema syndrome: Secondary | ICD-10-CM | POA: Diagnosis not present

## 2019-07-28 DIAGNOSIS — M25612 Stiffness of left shoulder, not elsewhere classified: Secondary | ICD-10-CM | POA: Insufficient documentation

## 2019-07-28 NOTE — Therapy (Signed)
[place], [place], [address] Phone: [phone]   Fax:  [phone]  Physical Therapy Treatment  Grant Details  Name: Darlene Grant MRN: [id number] Date of Birth: 01/21/1976 Referring Provider (PT): Dr. Nicholas Lose; Dr. Clovia Cuff   Encounter Date: 07/28/2019  PT End of Session - 07/28/19 1447    Visit Number  43    Number of Visits  20    PT Start Time  1007    PT Stop Time  1100    PT Time Calculation (min)  53 min       Past Medical History:  Diagnosis Date  . Abnormal Pap smear    ASCUS 03/13/11  . Anemia   . Breast pain, left   . Cancer ([place])   . Candida vaginitis   . Complication of anesthesia    pt states that all narcotics make her angry and she would prefer to avoid them.  . Rectal itching   . Vaginal Pap smear, abnormal     Past Surgical History:  Procedure Laterality Date  . BREAST BIOPSY Right 8/16  . CESAREAN SECTION N/A 11/21/2013   Procedure: CESAREAN SECTION;  Surgeon: Alwyn Pea, MD;  Location: [facility];  Service: Obstetrics;  Laterality: N/A;  . CESAREAN SECTION N/A 06/23/2016   Procedure: CESAREAN SECTION;  Surgeon: Emily Filbert, MD;  Location: [facility];  Service: Obstetrics;  Laterality: N/A;  . HAND SURGERY  04/2012   ligament repair  . MASTECTOMY WITH AXILLARY LYMPH NODE DISSECTION Bilateral 11/14/2018   Procedure: RIGHT MASTECTOMY WITH RIGHT AXILLARY LYMPH NODE DISSECTION AND LEFT RISK REDUCING MASTECTOMY;  Surgeon: Rolm Bookbinder, MD;  Location: [facility];  Service: General;  Laterality: Bilateral;  LATE ENTRY: Corrected laterality documentation to read right axillary lymph node dissection vs. documented laterality of left.    Marland Kitchen PILONIDAL CYST EXCISION  2000  . PORTACATH PLACEMENT N/A 05/28/2018   Procedure: INSERTION PORT-A-CATH WITH ULTRASOUND;  Surgeon: Rolm Bookbinder, MD;  Location: [place];   Service: General;  Laterality: N/A;  . WISDOM TOOTH EXTRACTION  AGE 44    There were no vitals filed for this visit.  Subjective Assessment - 07/28/19 1014    Subjective  Pt states she has been working out at home with weights. She is still having fullness in her neck that she had more when she exercised She feels very tight across her chest and lateral trunk.. She says she exercised 200 minutes last week with weight training twice with 3 # for 3 sets of 10    Pertinent History  breast cancer diagnosed November 2019: triple negative with Ki of 75% She completed chemotherapy 10/18/2018. 11/14/2018: she underwent bilateral mastectomy with 0/13 nodes positive on the right; radiation ended on 02/10/2019    Currently in Pain?  Yes    Pain Score  1     Pain Location  Chest    Pain Orientation  Right;Left    Pain Descriptors / Indicators  Sore    Pain Type  Acute pain   acute soreness after exercise   Pain Radiating Towards  across chest and to arms    Pain Onset  In the past 7 days    Pain Frequency  Intermittent                       [facility] Adult PT Treatment/Exercise - 07/28/19 0001      Exercises  Exercises  Shoulder;Lumbar      Lumbar Exercises: Standing   Other Standing Lumbar Exercises  vertically on blue foam roller, pt did scapular protraction and retraction, unilateral shoulder flexion and horizontal abduction for porlolonged stretch      Manual Therapy   Manual Therapy  Edema management;Soft tissue mobilization;Myofascial release;Scapular mobilization;Manual Lymphatic Drainage (MLD)    Edema Management  Provided chip pack for pt to wear at neck to try to help neck swelling feelings    Myofascial Release  to left lateral trunk  and deep pressure to trigger points at left scapular area     Scapular Mobilization  with left arm in interal rotation , able to get under scapula for mobilizaition     Manual Lymphatic Drainage (MLD)  briefly instructed in short neck, shoulder  circles, supraclavicular area to chest and also to posterior cervical area  Pt instructed and reported she knew what to do              PT Education - 07/28/19 1446    Education Details  self MLD and use of chip pack for feelings of neck fullness    Person(s) Educated  Grant    Methods  Explanation;Demonstration    Comprehension  Verbalized understanding       PT Short Term Goals - 03/31/19 1246      PT SHORT TERM GOAL #1   Title  independent with initial HEP for left hip    Time  4    Period  Weeks    Status  Achieved      PT SHORT TERM GOAL #2   Title  understand how to take care of vaginal health to reduce dryness and keep tissue mobility    Period  Weeks    Status  Achieved        PT Long Term Goals - 06/25/19 1130      PT LONG TERM GOAL #1   Title  Pt will have 165 degrees of painfree right shoulder abduction so that she can perform her household chores and exercises easier    Baseline  108 with pain; 164 degrees without pain-01/15/19; 150 degrees-06/19/19    Time  8    Period  Weeks    Status  Revised      PT LONG TERM GOAL #2   Title  Pt will report 75% decrease in bil shoulder and chest tightness after exercising    Baseline  Feels moderate tightness across chest and in bil shoulders after exercising at home-06/23/19    Time  8    Period  Weeks    Status  Revised      PT LONG TERM GOAL #3   Title  Pt will be independent in a home exercise program for shoulder ROM and strength    Baseline  still learning; pt is independent with HEP and is slowly getting back to her regular workout routine at home-06/23/19    Time  8    Period  Weeks    Status  On-going      PT LONG TERM GOAL #4   Title  Pt to report less tightness with OH reaching by at least 50% at Lt axilla.    Baseline  75% at this time-06/23/19    Time  8    Period  Weeks    Status  Achieved            Plan - 07/28/19 1447    Clinical Impression Statement  Pt is making great progress in  doing home exericse and taking care of herself. She is also participating in [facility] to help her move toward indepenence    Comorbidities  recent chemo and radiation    Examination-Activity Limitations  Bathing;Reach Overhead;Caring for Others;Lift;Hygiene/Grooming;Locomotion Level;Sleep    PT Duration  8 weeks    PT Treatment/Interventions  ADLs/Self Care Home Management;Therapeutic exercise;Manual techniques;Biofeedback;Electrical Stimulation;Therapeutic activities;Neuromuscular re-education;Grant/family education;Dry needling;Passive range of motion;Spinal Manipulations;Manual lymph drainage;Taping;Compression bandaging;Orthotic Fit/Training;Iontophoresis 4mg /ml Dexamethasone    PT Next Visit Plan  continue with manual therapy to bil upper quadrants adressing Rt trunk lymph edema, bil chest tightness and myofascial release to bil axillae, also STM prn to Lt shoulder as done today.    Consulted and Agree with Plan of Care  Grant       Grant will benefit from skilled therapeutic intervention in order to improve the following deficits and impairments:  Decreased knowledge of precautions, Decreased knowledge of use of DME, Postural dysfunction, Decreased range of motion, Increased fascial restricitons, Impaired UE functional use, Increased muscle spasms, Impaired perceived functional ability, Pain, Decreased scar mobility, Decreased strength, Increased edema, Difficulty walking, Decreased activity tolerance, Decreased mobility  Visit Diagnosis: Aftercare following surgery for neoplasm  Muscle weakness (generalized)  Postmastectomy lymphedema  Disorder of the skin and subcutaneous tissue related to radiation, unspecified  Stiffness of right shoulder joint  Stiffness of left shoulder, not elsewhere classified  Abnormal posture     Problem List Grant Active Problem List   Diagnosis Date Noted  . History of right breast cancer 11/14/2018  . Chemotherapy induced neutropenia ([place])  09/13/2018  . Port-A-Cath in place 06/10/2018  . Malignant neoplasm of upper-outer quadrant of right breast in female, estrogen receptor negative ([place]) 05/09/2018  . Low iron stores 01/16/2018  . Muscle spasm 01/15/2018  . Piriformis syndrome of left side 01/15/2018  . Grade I hemorrhoids 02/23/2017  . Low lying posterior placenta, antepartum 02/01/2016  . AMA (advanced maternal age) multigravida 35+ 11/22/2015  . 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 Calcification of right breast 02/19/2015  . Ligament tear 07/05/2012   Donato Heinz. Owens Shark PT  Norwood Levo 07/28/2019, 2:51 PM  [place], [place], [address] Phone: [phone]   Fax:  [phone]  Name: SHALEYA GORDER MRN: [id number] Date of Birth: [birth year]

## 2019-08-04 ENCOUNTER — Telehealth: Payer: Self-pay | Admitting: *Deleted

## 2019-08-04 NOTE — Telephone Encounter (Signed)
Pt reached out and c/o swelling to left neck for several weeks without improvement. Pt also still has swelling to submandibular glands. Scheduled appt with Dr. Lindi Adie on 2/17 at 1:15pm for physical exam and further workup if needed.

## 2019-08-05 NOTE — Progress Notes (Signed)
Patient Care Team: Nicholas Lose, MD as PCP - General (Hematology and Oncology) Rolm Bookbinder, MD as Consulting Physician (General Surgery) Magrinat, Virgie Dad, MD as Consulting Physician (Oncology) Gery Pray, MD as Consulting Physician (Radiation Oncology) Emily Filbert, MD as Consulting Physician (Obstetrics and Gynecology) Lavonna Monarch, MD as Consulting Physician (Dermatology) Mauro Kaufmann, RN as Oncology Nurse Navigator Rockwell Germany, RN as Oncology Nurse Navigator  DIAGNOSIS:    ICD-10-CM   1. Malignant neoplasm of upper-outer quadrant of right breast in female, estrogen receptor negative ([place])  C50.411    Z17.1     SUMMARY OF ONCOLOGIC HISTORY: Oncology History  Malignant neoplasm of upper-outer quadrant of right breast in female, estrogen receptor negative ([place])  05/03/2018 Initial Diagnosis   right breast upper outer quadrant and right axillary lymph node biopsy 05/03/2018 for a clinical T2 N2, stage IIIC invasive ductal carcinoma, triple negative, with an Ki-67 of 75%   06/03/2018 - 10/18/2018 Neo-Adjuvant Chemotherapy   Dose dense Adriamycin and Cytoxan followed by Taxol and carboplatin x12   11/14/2018 Surgery   Bilateral mastectomies: Left mastectomy: Fibrocystic changes, right mastectomy: Complete pathologic response no evidence of residual cancer, 0/13 lymph nodes negative   12/31/2018 -  Radiation Therapy   Adjuvant XRT     CHIEF COMPLIANT: Follow-up of triple negative right breast cancer, left neck swelling  INTERVAL HISTORY: Darlene Grant is a 44 y.o. with above-mentioned history of triple negative right breast cancertreated with neoadjuvant chemotherapy, bilateral mastectomies, radiation, and who is currently on surveillance. She presents to the clinic today due to recent left neck swelling for several weeks and continued submandibular gland swelling.  She noticed the swelling a few days ago it got worse and then it got better.  There  was no actual nodule that she palpated.  ALLERGIES:  is allergic to augmentin [amoxicillin-pot clavulanate]; hydrocodone; and keflex [cephalexin].  MEDICATIONS:  Current Outpatient Medications  Medication Sig Dispense Refill  . B Complex-C (B-COMPLEX WITH VITAMIN C) tablet Take 1 tablet by mouth daily.    . Calcium Acetate-Magnesium Carb 450-200 MG TABS Take by mouth.    . cyclobenzaprine (FLEXERIL) 10 MG tablet Take 0.5-1 tablets (5-10 mg total) by mouth 3 (three) times daily as needed for muscle spasms. (Patient not taking: Reported on 01/27/2019) 30 tablet 1  . fluticasone (FLONASE) 50 MCG/ACT nasal spray Place 2 sprays into both nostrils daily. (Patient not taking: Reported on 01/27/2019) 16 g 2  . loratadine (CLARITIN) 10 MG tablet Take 10 mg by mouth daily as needed for allergies.    . Multiple Vitamins-Minerals (MULTIVITAMIN WITH MINERALS) tablet Take 1 tablet by mouth daily.    . Probiotic Product (PROBIOTIC DAILY PO) Take 1 tablet by mouth.     No current facility-administered medications for this visit.    PHYSICAL EXAMINATION: ECOG PERFORMANCE STATUS: 1 - Symptomatic but completely ambulatory  There were no vitals filed for this visit. There were no vitals filed for this visit.  LABORATORY DATA:  I have reviewed the data as listed CMP Latest Ref Rng & Units 01/22/2019 12/02/2018 10/29/2018  Glucose 70 - 99 mg/dL 93 89 94  BUN 6 - 20 mg/dL '13 9 13  '$ Creatinine 0.44 - 1.00 mg/dL 0.78 0.75 0.84  Sodium 135 - 145 mmol/L 139 141 140  Potassium 3.5 - 5.1 mmol/L 4.1 3.9 4.4  Chloride 98 - 111 mmol/L 103 104 105  CO2 22 - 32 mmol/L '26 24 27  '$ Calcium 8.9 - 10.3 mg/dL  10.0 10.0 9.6  Total Protein 6.5 - 8.1 g/dL 7.4 7.3 7.1  Total Bilirubin 0.3 - 1.2 mg/dL 1.6(H) 1.0 0.9  Alkaline Phos 38 - 126 U/L 70 72 68  AST 15 - 41 U/L 15 14(L) 14(L)  ALT 0 - 44 U/L '12 12 14    '$ Lab Results  Component Value Date   WBC 3.1 (L) 01/22/2019   HGB 12.1 01/22/2019   HCT 36.3 01/22/2019   MCV  100.8 (H) 01/22/2019   PLT 193 01/22/2019   NEUTROABS 2.2 01/22/2019    ASSESSMENT & PLAN:  Malignant neoplasm of upper-outer quadrant of right breast in female, estrogen receptor negative ([place]) 05/03/2018:right breast upper outer quadrant and right axillary lymph node biopsy 05/03/2018 for a clinical T2 N2, stage IIIC invasive ductal carcinoma,triple negative, with [id number]%, no deleterious genetic mutations  06/03/2018-10/18/2018: Neoadjuvant chemotherapy with dose dense Adriamycin and Cytoxan followed by Taxol and carboplatin  11/14/2018:Bilateral mastectomies: Left mastectomy: Fibrocystic changes, right mastectomy: Complete pathologic response no evidence of residual cancer, 0/13 lymph nodes negative  Completed adjuvant radiation therapy  Treatment plan: Surveillance November 2020: CT CAP, CTneck, CT head: No evidence of metastatic disease.  Soft tissue edema in the right axilla 05/14/2019: Biopsy right axilla: Fibrosis no malignancy. Bone scan 05/20/2019: No evidence of bone metastases.  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 I reassured her that this swelling could be inflammation related.  I do not recommend obtaining any scans at this time.  Patient has appointments in June and will follow up with [place] at that time.    No orders of the defined types were placed in this encounter.  The patient has a good understanding of the overall plan. she agrees with it. she will call with any problems that may develop before the next visit here.  Total time spent: 20 mins including face to face time and time spent for planning, charting and coordination of care  Nicholas Lose, MD 08/06/2019  I, Cloyde Reams Dorshimer, am acting as scribe for Dr. Nicholas Lose.  I have reviewed the above  documentation for accuracy and completeness, and I agree with the above.

## 2019-08-06 ENCOUNTER — Other Ambulatory Visit: Payer: Self-pay

## 2019-08-06 ENCOUNTER — Inpatient Hospital Stay: Payer: BC Managed Care – PPO | Attending: Hematology and Oncology | Admitting: Hematology and Oncology

## 2019-08-06 DIAGNOSIS — Z171 Estrogen receptor negative status [ER-]: Secondary | ICD-10-CM | POA: Diagnosis not present

## 2019-08-06 DIAGNOSIS — Z853 Personal history of malignant neoplasm of breast: Secondary | ICD-10-CM | POA: Diagnosis not present

## 2019-08-06 DIAGNOSIS — C50411 Malignant neoplasm of upper-outer quadrant of right female breast: Secondary | ICD-10-CM

## 2019-08-06 NOTE — Assessment & Plan Note (Signed)
05/03/2018:right breast upper outer quadrant and right axillary lymph node biopsy 05/03/2018 for a clinical T2 N2, stage IIIC invasive ductal carcinoma,triple negative, with [id number]%, no deleterious genetic mutations  06/03/2018-10/18/2018: Neoadjuvant chemotherapy with dose dense Adriamycin and Cytoxan followed by Taxol and carboplatin  11/14/2018:Bilateral mastectomies: Left mastectomy: Fibrocystic changes, right mastectomy: Complete pathologic response no evidence of residual cancer, 0/13 lymph nodes negative  Completed adjuvant radiation therapy  Treatment plan: Surveillance November 2020: CT CAP, CTneck, CT head: No evidence of metastatic disease.  Soft tissue edema in the right axilla 05/14/2019: Biopsy right axilla: Fibrosis no malignancy. Bone scan 05/20/2019: No evidence of bone metastases.  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

## 2019-08-11 ENCOUNTER — Encounter: Payer: Self-pay | Admitting: Physical Therapy

## 2019-08-11 ENCOUNTER — Other Ambulatory Visit: Payer: Self-pay

## 2019-08-11 ENCOUNTER — Ambulatory Visit: Payer: BC Managed Care – PPO | Admitting: Physical Therapy

## 2019-08-11 DIAGNOSIS — M25612 Stiffness of left shoulder, not elsewhere classified: Secondary | ICD-10-CM

## 2019-08-11 DIAGNOSIS — Z483 Aftercare following surgery for neoplasm: Secondary | ICD-10-CM

## 2019-08-11 DIAGNOSIS — M6281 Muscle weakness (generalized): Secondary | ICD-10-CM

## 2019-08-11 DIAGNOSIS — I972 Postmastectomy lymphedema syndrome: Secondary | ICD-10-CM | POA: Diagnosis not present

## 2019-08-11 DIAGNOSIS — M25511 Pain in right shoulder: Secondary | ICD-10-CM | POA: Diagnosis not present

## 2019-08-11 DIAGNOSIS — L599 Disorder of the skin and subcutaneous tissue related to radiation, unspecified: Secondary | ICD-10-CM

## 2019-08-11 DIAGNOSIS — R293 Abnormal posture: Secondary | ICD-10-CM | POA: Diagnosis not present

## 2019-08-11 DIAGNOSIS — M25611 Stiffness of right shoulder, not elsewhere classified: Secondary | ICD-10-CM | POA: Diagnosis not present

## 2019-08-11 NOTE — Therapy (Signed)
[place], [place], [address] Phone: [phone]   Fax:  [phone]  Physical Therapy Treatment  Patient Details  Name: Darlene Grant MRN: [id number] Date of Birth: 17-Mar-1976 Referring Provider (PT): Dr. Nicholas Lose; Dr. Clovia Cuff   Encounter Date: 08/11/2019  PT End of Session - 08/11/19 1220    Visit Number  19    Number of Visits  63    Date for PT Re-Evaluation  08/18/19    PT Start Time  1005    PT Stop Time  1055    PT Time Calculation (min)  50 min    Activity Tolerance  Patient tolerated treatment well    Behavior During Therapy  [facility] for tasks assessed/performed       Past Medical History:  Diagnosis Date  . Abnormal Pap smear    ASCUS 03/13/11  . Anemia   . Breast pain, left   . Cancer ([place])   . Candida vaginitis   . Complication of anesthesia    pt states that all narcotics make her angry and she would prefer to avoid them.  . Rectal itching   . Vaginal Pap smear, abnormal     Past Surgical History:  Procedure Laterality Date  . BREAST BIOPSY Right 8/16  . CESAREAN SECTION N/A 11/21/2013   Procedure: CESAREAN SECTION;  Surgeon: Alwyn Pea, MD;  Location: [facility];  Service: Obstetrics;  Laterality: N/A;  . CESAREAN SECTION N/A 06/23/2016   Procedure: CESAREAN SECTION;  Surgeon: Emily Filbert, MD;  Location: [place];  Service: Obstetrics;  Laterality: N/A;  . HAND SURGERY  04/2012   ligament repair  . MASTECTOMY WITH AXILLARY LYMPH NODE DISSECTION Bilateral 11/14/2018   Procedure: RIGHT MASTECTOMY WITH RIGHT AXILLARY LYMPH NODE DISSECTION AND LEFT RISK REDUCING MASTECTOMY;  Surgeon: Rolm Bookbinder, MD;  Location: [facility];  Service: General;  Laterality: Bilateral;  LATE ENTRY: Corrected laterality documentation to read right axillary lymph node dissection vs. documented laterality of left.    Marland Kitchen PILONIDAL CYST EXCISION  2000  .  PORTACATH PLACEMENT N/A 05/28/2018   Procedure: INSERTION PORT-A-CATH WITH ULTRASOUND;  Surgeon: Rolm Bookbinder, MD;  Location: [facility];  Service: General;  Laterality: N/A;  . WISDOM TOOTH EXTRACTION  AGE 44    There were no vitals filed for this visit.  Subjective Assessment - 08/11/19 1009    Subjective  Pt has been having some increased swelling in her left neck and right axilla. She went to see Dr. Lindi Adie about it last week who thinks it is from inflammation    Pertinent History  breast cancer diagnosed November 2019: triple negative with Ki of 75% She completed chemotherapy 10/18/2018. 11/14/2018: she underwent bilateral mastectomy with 0/13 nodes positive on the right; radiation ended on 02/10/2019    Patient Stated Goals  sleep without pain, exercise without discomfort, carry daughter on left due to radiation on the right breast    Currently in Pain?  Yes    Pain Score  2     Pain Location  Axilla    Pain Orientation  Right;Left    Pain Descriptors / Indicators  Aching                       [facility] Adult PT Treatment/Exercise - 08/11/19 0001      Manual Therapy   Manual Therapy  Myofascial release;Manual Lymphatic Drainage (MLD)    Myofascial  Release  to left lateral trunk  and deep pressure to trigger points at left scapular area     Manual Lymphatic Drainage (MLD)  in supine for short neck and superficial and deep abdominals, right inguinal nodes and right axillo-inguinal anastamosis, left axillary nodes and anterior interaxillary anastamosis and chest, then to left sidelying for  posterior interaxillay anastamosis being careful not to overstretch radiated skin  Extra time spent on lateral trunk just distal to axilla  added stationary circles to left submental area                PT Short Term Goals - 03/31/19 1246      PT SHORT TERM GOAL #1   Title  independent with initial HEP for left hip    Time  4    Period  Weeks    Status   Achieved      PT SHORT TERM GOAL #2   Title  understand how to take care of vaginal health to reduce dryness and keep tissue mobility    Period  Weeks    Status  Achieved        PT Long Term Goals - 06/25/19 1130      PT LONG TERM GOAL #1   Title  Pt will have 165 degrees of painfree right shoulder abduction so that she can perform her household chores and exercises easier    Baseline  108 with pain; 164 degrees without pain-01/15/19; 150 degrees-06/19/19    Time  8    Period  Weeks    Status  Revised      PT LONG TERM GOAL #2   Title  Pt will report 75% decrease in bil shoulder and chest tightness after exercising    Baseline  Feels moderate tightness across chest and in bil shoulders after exercising at home-06/23/19    Time  8    Period  Weeks    Status  Revised      PT LONG TERM GOAL #3   Title  Pt will be independent in a home exercise program for shoulder ROM and strength    Baseline  still learning; pt is independent with HEP and is slowly getting back to her regular workout routine at home-06/23/19    Time  8    Period  Weeks    Status  On-going      PT LONG TERM GOAL #4   Title  Pt to report less tightness with OH reaching by at least 50% at Lt axilla.    Baseline  75% at this time-06/23/19    Time  8    Period  Weeks    Status  Achieved            Plan - 08/11/19 1221    Clinical Impression Statement  Pt had palpable congestion in right lateral trunk area and left submental that were palpabley and subjectively decreased after session today. Pt continues to benefit from PT but is trying to wean down on frequency    Personal Factors and Comorbidities  Other    Comorbidities  recent chemo and radiation    Examination-Activity Limitations  Bathing;Reach Overhead;Caring for Others;Lift;Hygiene/Grooming;Locomotion Level;Sleep    Rehab Potential  Good    PT Frequency  1x / week    PT Duration  8 weeks    PT Treatment/Interventions  ADLs/Self Care Home  Management;Therapeutic exercise;Manual techniques;Biofeedback;Electrical Stimulation;Therapeutic activities;Neuromuscular re-education;Patient/family education;Dry needling;Passive range of motion;Spinal Manipulations;Manual lymph drainage;Taping;Compression bandaging;Orthotic Fit/Training;Iontophoresis 4mg /ml Dexamethasone  PT Next Visit Plan  check goals and send renewal. continue with manual therapy to bil upper quadrants adressing Rt trunk lymph edema, bil chest tightness and myofascial release to bil axillae, also STM prn to Lt shoulder as done today.    Consulted and Agree with Plan of Care  Patient       Patient will benefit from skilled therapeutic intervention in order to improve the following deficits and impairments:  Decreased knowledge of precautions, Decreased knowledge of use of DME, Postural dysfunction, Decreased range of motion, Increased fascial restricitons, Impaired UE functional use, Increased muscle spasms, Impaired perceived functional ability, Pain, Decreased scar mobility, Decreased strength, Increased edema, Difficulty walking, Decreased activity tolerance, Decreased mobility  Visit Diagnosis: Aftercare following surgery for neoplasm  Muscle weakness (generalized)  Postmastectomy lymphedema  Disorder of the skin and subcutaneous tissue related to radiation, unspecified  Stiffness of right shoulder joint  Stiffness of left shoulder, not elsewhere classified  Abnormal posture  Acute pain of right shoulder     Problem List Patient Active Problem List   Diagnosis Date Noted  . History of right breast cancer 11/14/2018  . Chemotherapy induced neutropenia ([place]) 09/13/2018  . Port-A-Cath in place 06/10/2018  . Malignant neoplasm of upper-outer quadrant of right breast in female, estrogen receptor negative ([place]) 05/09/2018  . Low iron stores 01/16/2018  . Muscle spasm 01/15/2018  . Piriformis syndrome of left side 01/15/2018  . Grade I hemorrhoids 02/23/2017   . Low lying posterior placenta, antepartum 02/01/2016  . AMA (advanced maternal age) multigravida 35+ 11/22/2015  . 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 Calcification of right breast 02/19/2015  . Ligament tear 07/05/2012   Donato Heinz. Owens Shark PT  Norwood Levo 08/11/2019, 12:24 PM  [place], [place], [address] Phone: [phone]   Fax:  [phone]  Name: KAYLAH JERAULD MRN: [id number] Date of Birth: Oct 20, 1975

## 2019-08-25 ENCOUNTER — Ambulatory Visit: Payer: BC Managed Care – PPO | Attending: General Surgery | Admitting: Physical Therapy

## 2019-08-25 ENCOUNTER — Encounter: Payer: Self-pay | Admitting: Physical Therapy

## 2019-08-25 ENCOUNTER — Other Ambulatory Visit: Payer: Self-pay

## 2019-08-25 DIAGNOSIS — M25511 Pain in right shoulder: Secondary | ICD-10-CM | POA: Insufficient documentation

## 2019-08-25 DIAGNOSIS — I972 Postmastectomy lymphedema syndrome: Secondary | ICD-10-CM

## 2019-08-25 DIAGNOSIS — L599 Disorder of the skin and subcutaneous tissue related to radiation, unspecified: Secondary | ICD-10-CM | POA: Diagnosis not present

## 2019-08-25 DIAGNOSIS — R293 Abnormal posture: Secondary | ICD-10-CM

## 2019-08-25 DIAGNOSIS — M25611 Stiffness of right shoulder, not elsewhere classified: Secondary | ICD-10-CM | POA: Insufficient documentation

## 2019-08-25 DIAGNOSIS — Z483 Aftercare following surgery for neoplasm: Secondary | ICD-10-CM

## 2019-08-25 DIAGNOSIS — M6281 Muscle weakness (generalized): Secondary | ICD-10-CM

## 2019-08-25 NOTE — Therapy (Signed)
[place], [place], [address] Phone: [phone]   Fax:  [phone]  Physical Therapy Treatment  Patient Details  Name: Darlene Grant MRN: [id number] Date of Birth: 10/20/1975 Referring Provider (PT): Dr. Nicholas Lose; Dr. Clovia Cuff   Encounter Date: 08/25/2019  PT End of Session - 08/25/19 1320    Visit Number  45    Number of Visits  48    PT Start Time  1000    PT Stop Time  1055    PT Time Calculation (min)  55 min    Activity Tolerance  Patient tolerated treatment well    Behavior During Therapy  [facility] for tasks assessed/performed       Past Medical History:  Diagnosis Date  . Abnormal Pap smear    ASCUS 03/13/11  . Anemia   . Breast pain, left   . Cancer ([place])   . Candida vaginitis   . Complication of anesthesia    pt states that all narcotics make her angry and she would prefer to avoid them.  . Rectal itching   . Vaginal Pap smear, abnormal     Past Surgical History:  Procedure Laterality Date  . BREAST BIOPSY Right 8/16  . CESAREAN SECTION N/A 11/21/2013   Procedure: CESAREAN SECTION;  Surgeon: Alwyn Pea, MD;  Location: [facility];  Service: Obstetrics;  Laterality: N/A;  . CESAREAN SECTION N/A 06/23/2016   Procedure: CESAREAN SECTION;  Surgeon: Emily Filbert, MD;  Location: [place] [facility];  Service: Obstetrics;  Laterality: N/A;  . HAND SURGERY  04/2012   ligament repair  . MASTECTOMY WITH AXILLARY LYMPH NODE DISSECTION Bilateral 11/14/2018   Procedure: RIGHT MASTECTOMY WITH RIGHT AXILLARY LYMPH NODE DISSECTION AND LEFT RISK REDUCING MASTECTOMY;  Surgeon: Rolm Bookbinder, MD;  Location: [facility];  Service: General;  Laterality: Bilateral;  LATE ENTRY: Corrected laterality documentation to read right axillary lymph node dissection vs. documented laterality of left.    Marland Kitchen PILONIDAL CYST EXCISION  2000  . PORTACATH PLACEMENT N/A 05/28/2018   Procedure:  INSERTION PORT-A-CATH WITH ULTRASOUND;  Surgeon: Rolm Bookbinder, MD;  Location: [place];  Service: General;  Laterality: N/A;  . WISDOM TOOTH EXTRACTION  AGE 44    There were no vitals filed for this visit.  Subjective Assessment - 08/25/19 1004    Subjective  Pt feels that she has soreness in her axilla and feels some fullness in her throat.  She has been busy helping her elderly parents ( dad with Parkinson's  move to a new home) so she hasn't been able to exercise as much this week.  She feels she is ready to discharge for a while and come back after parents get settled. She feels like she knows what to do to manage her symptoms at home.    Pertinent History  breast cancer diagnosed November 2019: triple negative with Ki of 75% She completed chemotherapy 10/18/2018. 11/14/2018: she underwent bilateral mastectomy with 0/13 nodes positive on the right; radiation ended on 02/10/2019    Patient Stated Goals  sleep without pain, exercise without discomfort, carry daughter on left due to radiation on the right breast    Currently in Pain?  Yes    Pain Score  1     Pain Location  Axilla    Pain Orientation  Right    Pain Descriptors / Indicators  Sore;Aching    Pain Radiating Towards  sometimes to arm  Pain Onset  More than a month ago    Pain Frequency  Intermittent    Aggravating Factors   picking up her daughter    Pain Relieving Factors  stretching, MLD    Multiple Pain Sites  No         [facility] PT Assessment - 08/25/19 0001      Observation/Other Assessments   Observations  L-Dex score Rt UE -4.8      AROM   Right Shoulder Flexion  175 Degrees    Right Shoulder ABduction  173 Degrees        LYMPHEDEMA/ONCOLOGY QUESTIONNAIRE - 08/25/19 1045      Right Upper Extremity Lymphedema   10 cm Proximal to Olecranon Process  28 cm    Olecranon Process  24 cm    15 cm Proximal to Ulnar Styloid Process  23.7 cm    Just Proximal to Ulnar Styloid Process  16.5 cm     Across Hand at [employer]  18.8 cm    At [place] of 2nd Digit  5.9 cm      Left Upper Extremity Lymphedema   10 cm Proximal to Olecranon Process  27.5 cm    Olecranon Process  24 cm    15 cm Proximal to Ulnar Styloid Process  23.5 cm    Just Proximal to Ulnar Styloid Process  15.8 cm    Across Hand at [employer]  18.5 cm    At [place] of 2nd Digit  5.9 cm                [facility] Adult PT Treatment/Exercise - 08/25/19 0001      Manual Therapy   Manual Therapy  Edema management;Manual Lymphatic Drainage (MLD)    Edema Management  remeasured arms, took L-Dex score on [place]     Manual Lymphatic Drainage (MLD)  in supine for short neck and superficial and deep abdominals, right inguinal nodes and right axillo-inguinal anastamosis, left axillary nodes and anterior interaxillary anastamosis and chest, then to left sidelying for  posterior interaxillay anastamosis being careful not to overstretch radiated skin  Extra time spent on lateral trunk just distal to axilla  added stationary circles to left submental area                PT Short Term Goals - 03/31/19 1246      PT SHORT TERM GOAL #1   Title  independent with initial HEP for left hip    Time  4    Period  Weeks    Status  Achieved      PT SHORT TERM GOAL #2   Title  understand how to take care of vaginal health to reduce dryness and keep tissue mobility    Period  Weeks    Status  Achieved        PT Long Term Goals - 08/25/19 1008      PT LONG TERM GOAL #1   Title  Pt will have 165 degrees of painfree right shoulder abduction so that she can perform her household chores and exercises easier    Baseline  108 with pain; 164 degrees without pain-01/15/19; 150 degrees-06/19/19    Status  Achieved      PT LONG TERM GOAL #2   Title  Pt will report 75% decrease in bil shoulder and chest tightness after exercising    Status  Achieved      PT LONG TERM GOAL #3   Title  Pt will be independent in a home exercise  program for shoulder ROM and strength    Status  Achieved      PT LONG TERM GOAL #4   Title  Pt to report less tightness with OH reaching by at least 50% at Lt axilla.    Status  Achieved      PT LONG TERM GOAL #8   Title  Pt will report that the discomfort in her right axilla and radiation area is decreased by 50% and that she will be able to manage it at home    Status  Achieved            Plan - 08/25/19 1321    Clinical Impression Statement  Pt continues to struggle with feelings of fullness in right axilla and throat that are relieved temporarily with MLD and myofascial release.  She is independent in self MLD and home exericse and feels that she would like to discharge from PT for a while as she is very busy with family matters with her parents at this time knowing that she can return to PT as needed in the future. She has noteiced changes in the scar on her left chest and will try a silicone scar patch over that and talk about it with Dr. Donne Hazel at her appointment later this week. Baseline L-dex score was taken today and she will come back in 3 months to follow up on that.    Personal Factors and Comorbidities  Other    Comorbidities  recent chemo and radiation    Examination-Activity Limitations  Bathing;Reach Overhead;Caring for Others;Lift;Hygiene/Grooming;Locomotion Level;Sleep    Stability/Clinical Decision Making  Evolving/Moderate complexity    Clinical Decision Making  Moderate    Rehab Potential  Good    PT Frequency  1x / week    PT Treatment/Interventions  ADLs/Self Care Home Management;Therapeutic exercise;Manual techniques;Biofeedback;Electrical Stimulation;Therapeutic activities;Neuromuscular re-education;Patient/family education;Dry needling;Passive range of motion;Spinal Manipulations;Manual lymph drainage;Taping;Compression bandaging;Orthotic Fit/Training;Iontophoresis 57m/ml Dexamethasone    PT Next Visit Plan  Discharge this episode    Consulted and Agree  with Plan of Care  Patient       Patient will benefit from skilled therapeutic intervention in order to improve the following deficits and impairments:  Decreased knowledge of precautions, Decreased knowledge of use of DME, Postural dysfunction, Decreased range of motion, Increased fascial restricitons, Impaired UE functional use, Increased muscle spasms, Impaired perceived functional ability, Pain, Decreased scar mobility, Decreased strength, Increased edema, Difficulty walking, Decreased activity tolerance, Decreased mobility  Visit Diagnosis: Aftercare following surgery for neoplasm  Muscle weakness (generalized)  Postmastectomy lymphedema  Disorder of the skin and subcutaneous tissue related to radiation, unspecified  Stiffness of right shoulder joint  Abnormal posture  Acute pain of right shoulder     Problem List Patient Active Problem List   Diagnosis Date Noted  . History of right breast cancer 11/14/2018  . Chemotherapy induced neutropenia ([place] 09/13/2018  . Port-A-Cath in place 06/10/2018  . Malignant neoplasm of upper-outer quadrant of right breast in female, estrogen receptor negative ([place] 05/09/2018  . Low iron stores 01/16/2018  . Muscle spasm 01/15/2018  . Piriformis syndrome of left side 01/15/2018  . Grade I hemorrhoids 02/23/2017  . Low lying posterior placenta, antepartum 02/01/2016  . AMA (advanced maternal age) multigravida 35+ 11/22/2015  . 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 Calcification of right breast 02/19/2015  . Ligament tear 07/05/2012   PHYSICAL THERAPY DISCHARGE SUMMARY  Visits from Start of Care: 45 Current  functional level related to goals / functional outcomes: pt is indepenent in managing symptoms    Remaining deficits:   Intermittent soreness and fullness in right axilla and neck    Education / Equipment: Self MLD , use of compression and home exercise Plan: Patient agrees to discharge.  Patient goals were met. Patient  is being discharged due to meeting the stated rehab goals.  ?????     Maudry Diego, PT 08/25/19 1:30 PM  Norwood Levo 08/25/2019, 1:27 PM  [place], [place], [address] Phone: [phone]   Fax:  [phone]  Name: RUQAYA STRAUSS MRN: [id number] Date of Birth: September 30, 1975

## 2019-09-06 ENCOUNTER — Encounter: Payer: Self-pay | Admitting: Physical Therapy

## 2019-09-08 ENCOUNTER — Ambulatory Visit: Payer: BC Managed Care – PPO | Admitting: Physical Therapy

## 2019-09-09 ENCOUNTER — Telehealth: Payer: Self-pay | Admitting: Hematology and Oncology

## 2019-09-09 ENCOUNTER — Other Ambulatory Visit: Payer: Self-pay | Admitting: *Deleted

## 2019-09-09 DIAGNOSIS — C50411 Malignant neoplasm of upper-outer quadrant of right female breast: Secondary | ICD-10-CM

## 2019-09-09 DIAGNOSIS — Z171 Estrogen receptor negative status [ER-]: Secondary | ICD-10-CM

## 2019-09-09 NOTE — Telephone Encounter (Signed)
Scheduled appt per 3/23 sch message - unable to reach pt - left message with appt date and time

## 2019-09-11 ENCOUNTER — Inpatient Hospital Stay: Payer: BC Managed Care – PPO | Attending: Hematology and Oncology

## 2019-09-11 ENCOUNTER — Other Ambulatory Visit: Payer: Self-pay

## 2019-09-11 ENCOUNTER — Ambulatory Visit
Admission: RE | Admit: 2019-09-11 | Discharge: 2019-09-11 | Disposition: A | Discharge status: Home / Self Care | Payer: BC Managed Care – PPO | Source: Ambulatory Visit | Attending: Radiation Oncology | Admitting: Radiation Oncology

## 2019-09-11 ENCOUNTER — Inpatient Hospital Stay: Payer: BC Managed Care – PPO

## 2019-09-11 ENCOUNTER — Encounter: Payer: Self-pay | Admitting: Radiation Oncology

## 2019-09-11 VITALS — BP 104/74 | HR 75 | Temp 97.8°F | Resp 18 | Ht 64.0 in | Wt 145.2 lb

## 2019-09-11 DIAGNOSIS — Z923 Personal history of irradiation: Secondary | ICD-10-CM | POA: Diagnosis not present

## 2019-09-11 DIAGNOSIS — Z171 Estrogen receptor negative status [ER-]: Secondary | ICD-10-CM

## 2019-09-11 DIAGNOSIS — C50411 Malignant neoplasm of upper-outer quadrant of right female breast: Secondary | ICD-10-CM

## 2019-09-11 DIAGNOSIS — Z853 Personal history of malignant neoplasm of breast: Secondary | ICD-10-CM | POA: Diagnosis not present

## 2019-09-11 DIAGNOSIS — Z9013 Acquired absence of bilateral breasts and nipples: Secondary | ICD-10-CM | POA: Insufficient documentation

## 2019-09-11 DIAGNOSIS — Z9221 Personal history of antineoplastic chemotherapy: Secondary | ICD-10-CM | POA: Insufficient documentation

## 2019-09-11 DIAGNOSIS — Z08 Encounter for follow-up examination after completed treatment for malignant neoplasm: Secondary | ICD-10-CM | POA: Diagnosis not present

## 2019-09-11 LAB — CBC WITH DIFFERENTIAL (CANCER CENTER ONLY)
Abs Immature Granulocytes: 0.02 10*3/uL (ref 0.00–0.07)
Basophils Absolute: 0 10*3/uL (ref 0.0–0.1)
Basophils Relative: 0 %
Eosinophils Absolute: 0 10*3/uL (ref 0.0–0.5)
Eosinophils Relative: 1 %
HCT: 37.5 % (ref 36.0–46.0)
Hemoglobin: 12.5 g/dL (ref 12.0–15.0)
Immature Granulocytes: 1 %
Lymphocytes Relative: 21 %
Lymphs Abs: 0.8 10*3/uL (ref 0.7–4.0)
MCH: 32.8 pg (ref 26.0–34.0)
MCHC: 33.3 g/dL (ref 30.0–36.0)
MCV: 98.4 fL (ref 80.0–100.0)
Monocytes Absolute: 0.3 10*3/uL (ref 0.1–1.0)
Monocytes Relative: 7 %
Neutro Abs: 2.6 10*3/uL (ref 1.7–7.7)
Neutrophils Relative %: 70 %
Platelet Count: 217 10*3/uL (ref 150–400)
RBC: 3.81 MIL/uL — ABNORMAL LOW (ref 3.87–5.11)
RDW: 12.6 % (ref 11.5–15.5)
WBC Count: 3.7 10*3/uL — ABNORMAL LOW (ref 4.0–10.5)
nRBC: 0 % (ref 0.0–0.2)

## 2019-09-11 LAB — IRON AND TIBC
Iron: 87 ug/dL (ref 41–142)
Saturation Ratios: 24 % (ref 21–57)
TIBC: 369 ug/dL (ref 236–444)
UIBC: 282 ug/dL (ref 120–384)

## 2019-09-11 LAB — VITAMIN B12: Vitamin B-12: 329 pg/mL (ref 180–914)

## 2019-09-11 LAB — VITAMIN D 25 HYDROXY (VIT D DEFICIENCY, FRACTURES): Vit D, 25-Hydroxy: 53.76 ng/mL (ref 30–100)

## 2019-09-11 LAB — FERRITIN: Ferritin: 24 ng/mL (ref 11–307)

## 2019-09-11 NOTE — Patient Instructions (Signed)
Coronavirus (COVID-19) Are you at risk?  Are you at risk for the Coronavirus (COVID-19)?  To be considered HIGH RISK for Coronavirus (COVID-19), you have to meet the following criteria:  . Traveled to [place], [place], [place], [place] or [place]; or in the [place] to [place], [place], [place], or [place]; and have fever, cough, and shortness of breath within the last 2 weeks of travel OR . Been in close contact with a person diagnosed with COVID-19 within the last 2 weeks and have fever, cough, and shortness of breath . IF YOU DO NOT MEET THESE CRITERIA, YOU ARE CONSIDERED LOW RISK FOR COVID-19.  What to do if you are HIGH RISK for COVID-19?  . If you are having a medical emergency, call 911. . Seek medical care right away. Before you go to a doctor's office, urgent care or emergency department, call ahead and tell them about your recent travel, contact with someone diagnosed with COVID-19, and your symptoms. You should receive instructions from your physician's office regarding next steps of care.  . When you arrive at healthcare provider, tell the healthcare staff immediately you have returned from visiting [place], [place], [place], [place] or [place]; or traveled in the [place] to [place], [place], [place], or [place]; in the last two weeks or you have been in close contact with a person diagnosed with COVID-19 in the last 2 weeks.   . Tell the health care staff about your symptoms: fever, cough and shortness of breath. . After you have been seen by a medical provider, you will be either: o Tested for (COVID-19) and discharged home on quarantine except to seek medical care if symptoms worsen, and asked to  - Stay home and avoid contact with others until you get your results (4-5 days)  - Avoid travel on public transportation if possible (such as bus, train, or airplane) or o Sent to the Emergency Department by EMS for evaluation, COVID-19 testing, and possible  admission depending on your condition and test results.  What to do if you are LOW RISK for COVID-19?  Reduce your risk of any infection by using the same precautions used for avoiding the common cold or flu:  . Wash your hands often with soap and warm water for at least 20 seconds.  If soap and water are not readily available, use an alcohol-based hand sanitizer with at least 60% alcohol.  . If coughing or sneezing, cover your mouth and nose by coughing or sneezing into the elbow areas of your shirt or coat, into a tissue or into your sleeve (not your hands). . Avoid shaking hands with others and consider head nods or verbal greetings only. . Avoid touching your eyes, nose, or mouth with unwashed hands.  . Avoid close contact with people who are sick. . Avoid places or events with large numbers of people in one location, like concerts or sporting events. . Carefully consider travel plans you have or are making. . If you are planning any travel outside or inside the [place], visit the CDC's Travelers' Health webpage for the latest health notices. . If you have some symptoms but not all symptoms, continue to monitor at home and seek medical attention if your symptoms worsen. . If you are having a medical emergency, call 911.   ADDITIONAL HEALTHCARE OPTIONS FOR PATIENTS  [facility] / e-Visit: [email or web address]         [facility]: [phone]  [facility]: [phone]                   [facility]: [phone]   

## 2019-09-11 NOTE — Progress Notes (Signed)
Darlene Grant presents today for f/u with Dr. Sondra Come. Pt reports pain in RIGHT breast area, attributed to picking up daughter, rated 2/10. RIGHT XRT area is faintly hyperpigmented. Pt reports LEFT lower chest/ribs area feels like "a fascial pull".  Pt is using OTC lotion on area.   BP 104/74 (BP Location: Left Arm, Patient Position: Sitting)   Pulse 75   Temp 97.8 F (36.6 C) (Temporal)   Resp 18   Ht 5\' 4"  (1.626 m)   Wt 145 lb 4 oz (65.9 kg)   SpO2 100%   BMI 24.93 kg/m   Wt Readings from Last 3 Encounters:  09/11/19 145 lb 4 oz (65.9 kg)  08/06/19 150 lb 3.2 oz (68.1 kg)  05/21/19 148 lb 12.8 oz (67.5 kg)   Loma Sousa, RN BSN

## 2019-09-11 NOTE — Progress Notes (Signed)
Radiation Oncology         [phone] ________________________________  Name: Darlene Grant MRN: [id number]  Date: 09/11/2019  DOB: January 15, 1976  Follow-Up Visit Note  CC: Nicholas Lose, MD  Donella Stade, PA-C    ICD-10-CM   1. Malignant neoplasm of upper-outer quadrant of right breast in female, estrogen receptor negative ([place])  C50.411    Z17.1     Diagnosis:   Stage IIIB (ypT0, ypN0, M0) Right breast UOQ IDC, s/p mastectomy  Interval Since Last Radiation:  7 months 12/31/2018 through 02/10/2019 Site Technique Total Dose Dose per Fx Completed Fx Beam Energies  Thorax: CW_Rt 3D 50/50 2 25/25 6X, 10X  Thorax: CW_Rt_axilla 3D 45/45 1.8 25/25 6X, 10X  Thorax: CW_Rt_Boost_mast scar Electron 10/10 2 5/5 6E   Narrative:  The patient returns today for routine follow-up. She continues to attend physical therapy for her left shoulder and left hip. She last saw Dr. Lindi Adie for follow up on 08/06/2019.  She underwent staging scans on 04/22/2019. Head CT was negative for metastatic disease. Neck CT showed visible asymmetric soft tissue edema in the right axilla/subclavian region but no associated mass or adenopathy.   CT chest/abdomen/pelvis performed on 05/06/2019 showed: irregular soft tissue in right axilla and about the right chest wall; no evidence of distant metastatic disease. She proceeded to right breast ultrasound on 05/14/2019 to further evaluate the soft tissue, which showed: 3.3 cm soft tissue likely to represent postsurgical/post-treatment changes or scarring. They opted to proceed with biopsy of the soft tissue at that time, which fortunately was benign.  Bone scan performed on 05/20/2019 was normal.  On review of systems, she denies any pain or itching along the right chest region.  Denies any cough or breathing problems.  She is quite busy with helping her parents move taking care of her small children.  She does not have enough time to return back to work   ALLERGIES:  is allergic to augmentin [amoxicillin-pot clavulanate]; hydrocodone; and keflex [cephalexin].  Meds: Current Outpatient Medications  Medication Sig Dispense Refill  . B Complex-C (B-COMPLEX WITH VITAMIN C) tablet Take 1 tablet by mouth daily.    . Calcium Acetate-Magnesium Carb 450-200 MG TABS Take by mouth.    . loratadine (CLARITIN) 10 MG tablet Take 10 mg by mouth daily as needed for allergies.    . Multiple Vitamins-Minerals (MULTIVITAMIN WITH MINERALS) tablet Take 1 tablet by mouth daily.    . Probiotic Product (PROBIOTIC DAILY PO) Take 1 tablet by mouth.    . cyclobenzaprine (FLEXERIL) 10 MG tablet Take 0.5-1 tablets (5-10 mg total) by mouth 3 (three) times daily as needed for muscle spasms. (Patient not taking: Reported on 01/27/2019) 30 tablet 1  . fluticasone (FLONASE) 50 MCG/ACT nasal spray Place 2 sprays into both nostrils daily. (Patient not taking: Reported on 01/27/2019) 16 g 2   No current facility-administered medications for this encounter.    Physical Findings: The patient is in no acute distress. Patient is alert and oriented.  height is 5\' 4"  (1.626 m) and weight is 145 lb 4 oz (65.9 kg). Her temporal temperature is 97.8 F (36.6 C). Her blood pressure is 104/74 and her pulse is 75. Her respiration is 18 and oxygen saturation is 100%. .  No significant changes. Lungs are clear to auscultation bilaterally. Heart has regular rate and rhythm. No palpable cervical, supraclavicular, or axillary adenopathy. Abdomen soft, non-tender, normal bowel sounds. The left chest wall area shows a mastectomy scar which is  healed well.  The right chest wall area shows a mastectomy scar with some mild radiation changes.  The patient skin is healed well.  No palpable or visible signs of recurrence.  Patient has some mild edema in the axillary region.  I do not appreciate any significant edema in the neck area.   Lab Findings: Lab Results  Component Value Date   WBC 3.7 (L)  09/11/2019   HGB 12.5 09/11/2019   HCT 37.5 09/11/2019   MCV 98.4 09/11/2019   PLT 217 09/11/2019    Radiographic Findings: No results found.  Impression:   No evidence of recurrence on clinical exam today.  sHe wishes to continue follow-up with radiation oncology for the present time  Plan: Routine follow-up in radiation oncology in September.  She will follow up with Dr. Lindi Adie in June.   ____________________________________ Gery Pray, MD   This document serves as a record of services personally performed by Gery Pray, MD. It was created on his behalf by Wilburn Mylar, a trained medical scribe. The creation of this record is based on the scribe's personal observations and the provider's statements to them. This document has been checked and approved by the attending provider.

## 2019-11-23 NOTE — Progress Notes (Signed)
Patient Care Team: Nicholas Lose, MD as PCP - General (Hematology and Oncology) Rolm Bookbinder, MD as Consulting Physician (General Surgery) Magrinat, Virgie Dad, MD as Consulting Physician (Oncology) Gery Pray, MD as Consulting Physician (Radiation Oncology) Emily Filbert, MD as Consulting Physician (Obstetrics and Gynecology) Lavonna Monarch, MD as Consulting Physician (Dermatology) Mauro Kaufmann, RN as Oncology Nurse Navigator Rockwell Germany, RN as Oncology Nurse Navigator  DIAGNOSIS:    ICD-10-CM   1. Malignant neoplasm of upper-outer quadrant of right breast in female, estrogen receptor negative ([place])  C50.411    Z17.1     SUMMARY OF ONCOLOGIC HISTORY: Oncology History  Malignant neoplasm of upper-outer quadrant of right breast in female, estrogen receptor negative ([place])  05/03/2018 Initial Diagnosis   right breast upper outer quadrant and right axillary lymph node biopsy 05/03/2018 for a clinical T2 N2, stage IIIC invasive ductal carcinoma, triple negative, with an Ki-67 of 75%   06/03/2018 - 10/18/2018 Neo-Adjuvant Chemotherapy   Dose dense Adriamycin and Cytoxan followed by Taxol and carboplatin x12   11/14/2018 Surgery   Bilateral mastectomies: Left mastectomy: Fibrocystic changes, right mastectomy: Complete pathologic response no evidence of residual cancer, 0/13 lymph nodes negative   12/31/2018 -  Radiation Therapy   Adjuvant XRT     CHIEF COMPLIANT: Follow-up of triple negative right breast cancer  INTERVAL HISTORY: Darlene Grant is a 44 y.o. with above-mentioned history of triple negative right breast cancertreated with neoadjuvant chemotherapy, bilateral mastectomies, radiation, and who is currently on surveillance. She presents to the clinic today for follow-up.   ALLERGIES:  is allergic to augmentin [amoxicillin-pot clavulanate]; hydrocodone; and keflex [cephalexin].  MEDICATIONS:  Current Outpatient Medications  Medication Sig Dispense  Refill  . B Complex-C (B-COMPLEX WITH VITAMIN C) tablet Take 1 tablet by mouth daily.    . Calcium Acetate-Magnesium Carb 450-200 MG TABS Take by mouth.    . cyclobenzaprine (FLEXERIL) 10 MG tablet Take 0.5-1 tablets (5-10 mg total) by mouth 3 (three) times daily as needed for muscle spasms. (Patient not taking: Reported on 01/27/2019) 30 tablet 1  . fluticasone (FLONASE) 50 MCG/ACT nasal spray Place 2 sprays into both nostrils daily. (Patient not taking: Reported on 01/27/2019) 16 g 2  . loratadine (CLARITIN) 10 MG tablet Take 10 mg by mouth daily as needed for allergies.    . Multiple Vitamins-Minerals (MULTIVITAMIN WITH MINERALS) tablet Take 1 tablet by mouth daily.    . Probiotic Product (PROBIOTIC DAILY PO) Take 1 tablet by mouth.     No current facility-administered medications for this visit.    PHYSICAL EXAMINATION: ECOG PERFORMANCE STATUS: 1 - Symptomatic but completely ambulatory  There were no vitals filed for this visit. There were no vitals filed for this visit.  BREAST: No palpable masses or nodules in either right or left breasts. No palpable axillary supraclavicular or infraclavicular adenopathy no breast tenderness or nipple discharge. (exam performed in the presence of a chaperone)  LABORATORY DATA:  I have reviewed the data as listed CMP Latest Ref Rng & Units 01/22/2019 12/02/2018 10/29/2018  Glucose 70 - 99 mg/dL 93 89 94  BUN 6 - 20 mg/dL _0 Creatinine 0.44 - 1.00 mg/dL 0.78 0.75 0.84  Sodium 135 - 145 mmol/L 139 141 140  Potassium 3.5 - 5.1 mmol/L 4.1 3.9 4.4  Chloride 98 - 111 mmol/L 103 104 105  CO2 22 - 32 mmol/L _1 Calcium 8.9 - 10.3 mg/dL 10.0 10.0 9.6  Total Protein  6.5 - 8.1 g/dL 7.4 7.3 7.1  Total Bilirubin 0.3 - 1.2 mg/dL 1.6(H) 1.0 0.9  Alkaline Phos 38 - 126 U/L 70 72 68  AST 15 - 41 U/L 15 14(L) 14(L)  ALT 0 - 44 U/L _0 Lab Results  Component Value Date   WBC 3.7 (L) 09/11/2019   HGB 12.5 09/11/2019   HCT 37.5 09/11/2019    MCV 98.4 09/11/2019   PLT 217 09/11/2019   NEUTROABS 2.6 09/11/2019    ASSESSMENT & PLAN:  Malignant neoplasm of upper-outer quadrant of right breast in female, estrogen receptor negative ([place]) 05/03/2018:right breast upper outer quadrant and right axillary lymph node biopsy 05/03/2018 for a clinical T2 N2, stage IIIC invasive ductal carcinoma,triple negative, with anKi-6775%, no deleterious genetic mutations  06/03/2018-10/18/2018: Neoadjuvant chemotherapy with dose dense Adriamycin and Cytoxan followed by Taxol and carboplatin  11/14/2018:Bilateral mastectomies: Left mastectomy: Fibrocystic changes, right mastectomy: Complete pathologic response no evidence of residual cancer, 0/13 lymph nodes negative Completed adjuvant radiation therapy --------------------------------------------------------------------------------------------------------------------------------------------------------------- Treatment plan: Surveillance November 2020: CT CAP, CTneck, CT head: No evidence of metastatic disease. Soft tissue edema in the right axilla 05/14/2019: Biopsy right axilla: Fibrosis no malignancy. Bone scan 05/20/2019:No evidence of bone metastases.  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  She has minor keloid on the left chest wall.  I did not recommend any further interventions for that. Subjective complains of swelling on the left side of the neck: No palpable lumps or nodules  Return to clinic in March 2022 for follow-up (patient wants to be followed every 3 months x 1 of [place])  No orders of the defined types were placed in this encounter.  The patient has a good understanding of the overall plan. she agrees with it. she will call with any problems that may develop before the next visit here.  Total time spent: 20 mins including face to face  time and time spent for planning, charting and coordination of care  Nicholas Lose, MD 11/24/2019  I, Cloyde Reams Dorshimer, am acting as scribe for Dr. Nicholas Lose.  I have reviewed the above documentation for accuracy and completeness, and I agree with the above.

## 2019-11-24 ENCOUNTER — Ambulatory Visit: Payer: BC Managed Care – PPO | Attending: General Surgery | Admitting: Physical Therapy

## 2019-11-24 ENCOUNTER — Inpatient Hospital Stay: Payer: BC Managed Care – PPO | Attending: Hematology and Oncology | Admitting: Hematology and Oncology

## 2019-11-24 ENCOUNTER — Other Ambulatory Visit: Payer: Self-pay | Admitting: *Deleted

## 2019-11-24 ENCOUNTER — Other Ambulatory Visit: Payer: Self-pay

## 2019-11-24 DIAGNOSIS — L599 Disorder of the skin and subcutaneous tissue related to radiation, unspecified: Secondary | ICD-10-CM | POA: Insufficient documentation

## 2019-11-24 DIAGNOSIS — C50411 Malignant neoplasm of upper-outer quadrant of right female breast: Secondary | ICD-10-CM

## 2019-11-24 DIAGNOSIS — I972 Postmastectomy lymphedema syndrome: Secondary | ICD-10-CM | POA: Insufficient documentation

## 2019-11-24 DIAGNOSIS — Z853 Personal history of malignant neoplasm of breast: Secondary | ICD-10-CM | POA: Insufficient documentation

## 2019-11-24 DIAGNOSIS — Z171 Estrogen receptor negative status [ER-]: Secondary | ICD-10-CM

## 2019-11-24 DIAGNOSIS — M25611 Stiffness of right shoulder, not elsewhere classified: Secondary | ICD-10-CM | POA: Insufficient documentation

## 2019-11-24 DIAGNOSIS — Z483 Aftercare following surgery for neoplasm: Secondary | ICD-10-CM | POA: Insufficient documentation

## 2019-11-24 DIAGNOSIS — R293 Abnormal posture: Secondary | ICD-10-CM | POA: Insufficient documentation

## 2019-11-24 MED ORDER — TURMERIC 500 MG PO CAPS: 1.0000 | ORAL_CAPSULE | Freq: Every day | ORAL | Status: AC

## 2019-11-24 MED ORDER — VITAMIN D 125 MCG (5000 UT) PO CAPS: 5000.0000 [IU] | ORAL_CAPSULE | Freq: Every day | ORAL | Status: AC

## 2019-11-24 NOTE — Therapy (Signed)
[place], [place], [address] Phone: [phone]   Fax:  [phone]  Physical Therapy Treatment  Patient Details  Name: Darlene Grant MRN: [id number] Date of Birth: 1975/08/17 Referring Provider (PT): Dr. Nicholas Lose; Dr. Clovia Cuff   Encounter Date: 11/24/2019    Past Medical History:  Diagnosis Date   Abnormal Pap smear    ASCUS 03/13/11   Anemia    Breast pain, left    Cancer (HCC)    Candida vaginitis    Complication of anesthesia    pt states that all narcotics make her angry and she would prefer to avoid them.   Rectal itching    Vaginal Pap smear, abnormal     Past Surgical History:  Procedure Laterality Date   BREAST BIOPSY Right 8/16   CESAREAN SECTION N/A 11/21/2013   Procedure: CESAREAN SECTION;  Surgeon: Alwyn Pea, MD;  Location: [facility];  Service: Obstetrics;  Laterality: N/A;   CESAREAN SECTION N/A 06/23/2016   Procedure: CESAREAN SECTION;  Surgeon: Emily Filbert, MD;  Location: [facility];  Service: Obstetrics;  Laterality: N/A;   HAND SURGERY  04/2012   ligament repair   MASTECTOMY WITH AXILLARY LYMPH NODE DISSECTION Bilateral 11/14/2018   Procedure: RIGHT MASTECTOMY WITH RIGHT AXILLARY LYMPH NODE DISSECTION AND LEFT RISK REDUCING MASTECTOMY;  Surgeon: Rolm Bookbinder, MD;  Location: [facility];  Service: General;  Laterality: Bilateral;  LATE ENTRY: Corrected laterality documentation to read right axillary lymph node dissection vs. documented laterality of left.     PILONIDAL CYST EXCISION  2000   PORTACATH PLACEMENT N/A 05/28/2018   Procedure: INSERTION PORT-A-CATH WITH ULTRASOUND;  Surgeon: Rolm Bookbinder, MD;  Location: [facility];  Service: General;  Laterality: N/A;   WISDOM TOOTH EXTRACTION  AGE 44    There were no vitals filed for this visit.  Subjective Assessment - 11/24/19 1209    Subjective   Patient reports she is doing well and is here today for her 3 month L-Dex screening.    Pertinent History  breast cancer diagnosed November 2019: triple negative with Ki of 75% She completed chemotherapy 10/18/2018. 11/14/2018: she underwent bilateral mastectomy with 0/13 nodes positive on the right; radiation ended on 02/10/2019             L-DEX FLOWSHEETS - 11/24/19 1200      L-DEX LYMPHEDEMA SCREENING   Measurement Type  Unilateral    L-DEX MEASUREMENT EXTREMITY  Upper Extremity    POSITION   Standing    DOMINANT SIDE  Right    At Risk Side  Right    BASELINE SCORE (UNILATERAL)  -4.8    L-DEX SCORE (UNILATERAL)  13.9    VALUE CHANGE (UNILAT)  18.7    Comment  Visible edema present; pt to begin lymphedema treatment         Plan - 11/24/19 1209    Clinical Impression Statement  L-Dex screen complete. Pt with visible edema and is 18.9 points from baseline. She will begin PT next week for lymphedema treatment and will wear her compression sleeve until then.    PT Next Visit Plan  Eval fro lymphedema       Patient will benefit from skilled therapeutic intervention in order to improve the following deficits and impairments:     Visit Diagnosis: Aftercare following surgery for neoplasm  Postmastectomy lymphedema     Problem List Patient Active Problem List   Diagnosis Date  Noted   History of right breast cancer 11/14/2018   Chemotherapy induced neutropenia ([place]) 09/13/2018   Port-A-Cath in place 06/10/2018   Malignant neoplasm of upper-outer quadrant of right breast in female, estrogen receptor negative ([place]) 05/09/2018   Low iron stores 01/16/2018   Muscle spasm 01/15/2018   Piriformis syndrome of left side 01/15/2018   Grade I hemorrhoids 02/23/2017   Low lying posterior placenta, antepartum 02/01/2016   AMA (advanced maternal age) multigravida 35+ 11/22/2015   Fibroadenoma of right breast 04/05/2015   Calcification of right breast 02/19/2015    Ligament tear 07/05/2012   Annia Friendly, PT 11/24/19 1:00 PM  [place], [place], [address] Phone: [phone]   Fax:  [phone]  Name: Darlene Grant MRN: [id number] Date of Birth: 1975-12-20

## 2019-11-24 NOTE — Assessment & Plan Note (Signed)
05/03/2018:right breast upper outer quadrant and right axillary lymph node biopsy 05/03/2018 for a clinical T2 N2, stage IIIC invasive ductal carcinoma,triple negative, with anKi-6775%, no deleterious genetic mutations  06/03/2018-10/18/2018: Neoadjuvant chemotherapy with dose dense Adriamycin and Cytoxan followed by Taxol and carboplatin  11/14/2018:Bilateral mastectomies: Left mastectomy: Fibrocystic changes, right mastectomy: Complete pathologic response no evidence of residual cancer, 0/13 lymph nodes negative Completed adjuvant radiation therapy --------------------------------------------------------------------------------------------------------------------------------------------------------------- Treatment plan: Surveillance November 2020: CT CAP, CTneck, CT head: No evidence of metastatic disease. Soft tissue edema in the right axilla 05/14/2019: Biopsy right axilla: Fibrosis no malignancy. Bone scan 05/20/2019:No evidence of bone metastases.  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 Subjective complains of swelling on the left side of the neck  Return to clinic in 1 year for follow-up

## 2019-11-27 ENCOUNTER — Telehealth: Payer: Self-pay | Admitting: Hematology and Oncology

## 2019-11-27 NOTE — Telephone Encounter (Signed)
Scheduled per 06/07 los, patient hs been called and notified.

## 2019-12-02 ENCOUNTER — Ambulatory Visit: Payer: BC Managed Care – PPO | Admitting: Hematology and Oncology

## 2019-12-02 ENCOUNTER — Encounter: Payer: Self-pay | Admitting: Physical Therapy

## 2019-12-04 ENCOUNTER — Ambulatory Visit: Payer: BC Managed Care – PPO | Admitting: Physical Therapy

## 2019-12-09 ENCOUNTER — Other Ambulatory Visit: Payer: Self-pay

## 2019-12-09 ENCOUNTER — Encounter: Payer: Self-pay | Admitting: Physical Therapy

## 2019-12-09 ENCOUNTER — Ambulatory Visit: Payer: BC Managed Care – PPO | Admitting: Physical Therapy

## 2019-12-09 DIAGNOSIS — Z483 Aftercare following surgery for neoplasm: Secondary | ICD-10-CM

## 2019-12-09 DIAGNOSIS — I972 Postmastectomy lymphedema syndrome: Secondary | ICD-10-CM | POA: Diagnosis not present

## 2019-12-09 DIAGNOSIS — R293 Abnormal posture: Secondary | ICD-10-CM

## 2019-12-09 DIAGNOSIS — L599 Disorder of the skin and subcutaneous tissue related to radiation, unspecified: Secondary | ICD-10-CM | POA: Diagnosis not present

## 2019-12-09 DIAGNOSIS — M25611 Stiffness of right shoulder, not elsewhere classified: Secondary | ICD-10-CM | POA: Diagnosis not present

## 2019-12-09 NOTE — Therapy (Signed)
[place], [place], [address] Phone: [phone]   Fax:  [phone]  Physical Therapy Evaluation  Patient Details  Name: Darlene Grant MRN: [id number] Date of Birth: Aug 16, 1975 Referring Provider (PT): Dr. Nicholas Lose; Dr. Clovia Cuff   Encounter Date: 12/09/2019   PT End of Session - 12/09/19 1930    Visit Number 1    Number of Visits 9   6 so far in 2021   PT Start Time 1610    PT Stop Time 1702    PT Time Calculation (min) 52 min    Activity Tolerance Patient tolerated treatment well    Behavior During Therapy [facility] for tasks assessed/performed           Past Medical History:  Diagnosis Date   Abnormal Pap smear    ASCUS 03/13/11   Anemia    Breast pain, left    Cancer (HCC)    Candida vaginitis    Complication of anesthesia    pt states that all narcotics make her angry and she would prefer to avoid them.   Rectal itching    Vaginal Pap smear, abnormal     Past Surgical History:  Procedure Laterality Date   BREAST BIOPSY Right 8/16   CESAREAN SECTION N/A 11/21/2013   Procedure: CESAREAN SECTION;  Surgeon: Alwyn Pea, MD;  Location: [facility];  Service: Obstetrics;  Laterality: N/A;   CESAREAN SECTION N/A 06/23/2016   Procedure: CESAREAN SECTION;  Surgeon: Emily Filbert, MD;  Location: [place];  Service: Obstetrics;  Laterality: N/A;   HAND SURGERY  04/2012   ligament repair   MASTECTOMY WITH AXILLARY LYMPH NODE DISSECTION Bilateral 11/14/2018   Procedure: RIGHT MASTECTOMY WITH RIGHT AXILLARY LYMPH NODE DISSECTION AND LEFT RISK REDUCING MASTECTOMY;  Surgeon: Rolm Bookbinder, MD;  Location: [place];  Service: General;  Laterality: Bilateral;  LATE ENTRY: Corrected laterality documentation to read right axillary lymph node dissection vs. documented laterality of left.     PILONIDAL CYST EXCISION  2000   PORTACATH PLACEMENT N/A  05/28/2018   Procedure: INSERTION PORT-A-CATH WITH ULTRASOUND;  Surgeon: Rolm Bookbinder, MD;  Location: [place];  Service: General;  Laterality: N/A;   WISDOM TOOTH EXTRACTION  AGE 44    There were no vitals filed for this visit.    Subjective Assessment - 12/09/19 1812    Subjective Pt reports she has been wearing her sleeve but hasn't been able to tolerate it for a full 12 hours every day She frequently work it with her gauntlet She has been very  busy taking care of her parents as they had to move due to her father's declining health    Pertinent History breast cancer diagnosed November 2019: triple negative with Ki of 75% She completed chemotherapy 10/18/2018. 11/14/2018: she underwent bilateral mastectomy with 0/13 nodes positive on the right; radiation ended on 02/10/2019    Patient Stated Goals to not get lymphedema    Currently in Pain? No/denies              [facility] PT Assessment - 12/09/19 0001      Assessment   Referring Provider (PT) Dr. Nicholas Lose; Dr. Clovia Cuff    Onset Date/Surgical Date 05/03/18    Hand Dominance Right    Prior Therapy therapy earlier this year for hip pain and shoulder pain and stiffness       Precautions   Precautions Other (comment)  Precaution Comments risk for lymphedema      Restrictions   Weight Bearing Restrictions No      Balance Screen   Has the patient fallen in the past 6 months No    Has the patient had a decrease in activity level because of a fear of falling?  No    Is the patient reluctant to leave their home because of a fear of falling?  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, assiting parents in [place] likes to walk and  exercise regularly, well educated in exercise       Cognition   Overall Cognitive Status Within Functional Limits for tasks assessed      Observation/Other Assessments   Observations possible fullness in right forearm     Skin Integrity no open areas       Coordination   Gross Motor Movements are Fluid and Coordinated Yes      Posture/Postural Control   Posture/Postural Control Postural limitations    Postural Limitations Increased lumbar lordosis;Decreased thoracic kyphosis      ROM / Strength   AROM / PROM / Strength AROM;Strength      AROM   Overall AROM  Within functional limits for tasks performed      PROM   Overall PROM Comments tightness in right axilla with visible soft tissue tightness/possible cording       Strength   Overall Strength Within functional limits for tasks performed             LYMPHEDEMA/ONCOLOGY QUESTIONNAIRE - 12/09/19 0001      Right Upper Extremity Lymphedema   10 cm Proximal to Olecranon Process 27 cm    Olecranon Process 24.5 cm    15 cm Proximal to Ulnar Styloid Process 24.3 cm    Just Proximal to Ulnar Styloid Process 16.7 cm    Across Hand at [employer] 18.8 cm    At [place] of 2nd Digit 5.8 cm      Left Upper Extremity Lymphedema   10 cm Proximal to Olecranon Process 27.5 cm    Olecranon Process 24 cm    15 cm Proximal to Ulnar Styloid Process 22.8 cm    Just Proximal to Ulnar Styloid Process 15.5 cm    Across Hand at [employer] 18.5 cm    At [place] of 2nd Digit 5.8 cm           L-DEX FLOWSHEETS - 12/09/19 1800      L-DEX LYMPHEDEMA SCREENING   Measurement Type Unilateral    L-DEX MEASUREMENT EXTREMITY Upper Extremity    POSITION  Standing    DOMINANT SIDE Right    At Risk Side Right    BASELINE SCORE (UNILATERAL) -4.8    L-DEX SCORE (UNILATERAL) 14.5    VALUE CHANGE (UNILAT) 19.3                  Objective measurements completed on examination: See above findings.       [facility]  Treatment/Exercise - 12/09/19 0001      Manual Therapy   Edema Management instruction in self MLD to forearm with forearm  elevated and forearm pronation/supination and hand grip and relax     Manual Lymphatic Drainage (MLD) in supine for short neck and superficial and deep abdominals, right inguinal nodes and right axillo-inguinal anastamosis, left axillary nodes and anterior interaxillary anastamosis and chest, then to left sidelying for  posterior interaxillay anastamosis being careful not to overstretch radiated skin  Extra time spent on forearn                  PT Education - 12/09/19 1921    Education Details self MLD with focus on forearm with elevation and exercise    Person(s) Educated Patient    Methods Explanation;Demonstration    Comprehension Need further instruction            PT Short Term Goals - 03/31/19 1246      PT SHORT TERM GOAL #1   Title independent with initial HEP for left hip    Time 4    Period Weeks    Status Achieved      PT SHORT TERM GOAL #2   Title understand how to take care of vaginal health to reduce dryness and keep tissue mobility    Period Weeks    Status Achieved             PT Long Term Goals - 12/09/19 1942      PT LONG TERM GOAL #1   Title Pt will have reduction in L-Dex score to < 10    Baseline 14.5 on 12/09/2019    Time 4    Period Weeks    Status New      PT LONG TERM GOAL #2   Title Pt will report she is confident in her independence to use self MLD and use of compression to manage her lymphedema    Time 4    Period Weeks    Status New      PT LONG TERM GOAL #3   Title Pt will report she has retuned to gradually progressive resistive exercise for upper body to decrease lymphedema risk   Pt has stopped exercising to help her parents more   Time 4    Period Weeks    Status New                  Plan - 12/09/19 1935    Clinical Impression Statement Pt returns to PT with elevated L-Dex scores  indicating sub clinical lymphedema in her right arm. She has minimal increase to tape measurement in right forearm and elbow and possble increase in palpable bogginess in this area too. She has been trying to wear her Juzo compression sleeve the past 2 weeks, but L-dex today shows even more of an increase since 11/24/2019.  Talked about options for compression today and encouraged pt to continue to wear her sleeve as much as possilbe ( research recommends 12 hours per day)  She may benefit from short term bandaging or velco night garment for more compression to forearm Pt gave OK for demographics to be sent to [facility] to see if she has coverage for a night or velcro garment    Comorbidities chemo and radiation with cording and fascial tightness after    Examination-Activity Limitations Bathing;Reach Overhead;Caring for Others;Lift;Hygiene/Grooming;Locomotion Level;Sleep    Stability/Clinical Decision Making Evolving/Moderate complexity    Clinical Decision Making Moderate    Rehab Potential Good    PT Frequency 2x / week    PT Duration 4 weeks    PT  Treatment/Interventions ADLs/Self Care Home Management;Therapeutic exercise;Manual techniques;Therapeutic activities;Neuromuscular re-education;Patient/family education;Passive range of motion;Manual lymph drainage;Taping;Compression bandaging;Orthotic Fit/Training    PT Next Visit Plan Continue MLD consider teaching self bandaging.    Consulted and Agree with Plan of Care Patient           Patient will benefit from skilled therapeutic intervention in order to improve the following deficits and impairments:  Decreased knowledge of precautions, Decreased knowledge of use of DME, Postural dysfunction, Decreased range of motion, Increased fascial restricitons, Impaired UE functional use, Increased muscle spasms, Impaired perceived functional ability, Pain, Decreased scar mobility, Decreased strength, Increased edema, Difficulty walking, Decreased activity  tolerance, Decreased mobility  Visit Diagnosis: Postmastectomy lymphedema - Plan: PT plan of care cert/re-cert  Aftercare following surgery for neoplasm - Plan: PT plan of care cert/re-cert  Abnormal posture - Plan: PT plan of care cert/re-cert  Disorder of the skin and subcutaneous tissue related to radiation, unspecified - Plan: PT plan of care cert/re-cert     Problem List Patient Active Problem List   Diagnosis Date Noted   History of right breast cancer 11/14/2018   Chemotherapy induced neutropenia ([place]) 09/13/2018   Port-A-Cath in place 06/10/2018   Malignant neoplasm of upper-outer quadrant of right breast in female, estrogen receptor negative ([place]) 05/09/2018   Low iron stores 01/16/2018   Muscle spasm 01/15/2018   Piriformis syndrome of left side 01/15/2018   Grade I hemorrhoids 02/23/2017   Low lying posterior placenta, antepartum 02/01/2016   AMA (advanced maternal age) multigravida 35+ 11/22/2015   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 Owens Shark PT  Norwood Levo 12/09/2019, 7:49 PM  [place], [place], [address] Phone: [phone]   Fax:  [phone]  Name: Darlene Grant MRN: [id number] Date of Birth: Jun 17, 1976

## 2019-12-10 ENCOUNTER — Encounter: Payer: Self-pay | Admitting: Physical Therapy

## 2019-12-11 ENCOUNTER — Ambulatory Visit: Payer: BC Managed Care – PPO | Admitting: Physical Therapy

## 2019-12-11 ENCOUNTER — Encounter: Payer: Self-pay | Admitting: Physical Therapy

## 2019-12-11 ENCOUNTER — Other Ambulatory Visit: Payer: Self-pay

## 2019-12-11 DIAGNOSIS — R293 Abnormal posture: Secondary | ICD-10-CM | POA: Diagnosis not present

## 2019-12-11 DIAGNOSIS — L599 Disorder of the skin and subcutaneous tissue related to radiation, unspecified: Secondary | ICD-10-CM

## 2019-12-11 DIAGNOSIS — M25611 Stiffness of right shoulder, not elsewhere classified: Secondary | ICD-10-CM

## 2019-12-11 DIAGNOSIS — I972 Postmastectomy lymphedema syndrome: Secondary | ICD-10-CM

## 2019-12-11 DIAGNOSIS — Z483 Aftercare following surgery for neoplasm: Secondary | ICD-10-CM | POA: Diagnosis not present

## 2019-12-11 NOTE — Patient Instructions (Signed)
[email or web address] Self care videos Bandaging

## 2019-12-11 NOTE — Therapy (Signed)
[place], [place], [address] Phone: [phone]   Fax:  [phone]  Physical Therapy Treatment  Patient Details  Name: Darlene Grant MRN: [id number] Date of Birth: 13-Apr-1976 Referring Provider (PT): Dr. Nicholas Lose; Dr. Clovia Cuff   Encounter Date: 12/11/2019   PT End of Session - 12/11/19 1652    Visit Number 2    Date for PT Re-Evaluation 08/18/19    PT Start Time 1400    PT Stop Time 1515    PT Time Calculation (min) 75 min    Activity Tolerance Patient tolerated treatment well    Behavior During Therapy [facility] for tasks assessed/performed           Past Medical History:  Diagnosis Date  . Abnormal Pap smear    ASCUS 03/13/11  . Anemia   . Breast pain, left   . Cancer ([place])   . Candida vaginitis   . Complication of anesthesia    pt states that all narcotics make her angry and she would prefer to avoid them.  . Rectal itching   . Vaginal Pap smear, abnormal     Past Surgical History:  Procedure Laterality Date  . BREAST BIOPSY Right 8/16  . CESAREAN SECTION N/A 11/21/2013   Procedure: CESAREAN SECTION;  Surgeon: Alwyn Pea, MD;  Location: [facility];  Service: Obstetrics;  Laterality: N/A;  . CESAREAN SECTION N/A 06/23/2016   Procedure: CESAREAN SECTION;  Surgeon: Emily Filbert, MD;  Location: [place];  Service: Obstetrics;  Laterality: N/A;  . HAND SURGERY  04/2012   ligament repair  . MASTECTOMY WITH AXILLARY LYMPH NODE DISSECTION Bilateral 11/14/2018   Procedure: RIGHT MASTECTOMY WITH RIGHT AXILLARY LYMPH NODE DISSECTION AND LEFT RISK REDUCING MASTECTOMY;  Surgeon: Rolm Bookbinder, MD;  Location: [place];  Service: General;  Laterality: Bilateral;  LATE ENTRY: Corrected laterality documentation to read right axillary lymph node dissection vs. documented laterality of left.    [facility] PILONIDAL CYST EXCISION  2000  . PORTACATH PLACEMENT N/A 05/28/2018    Procedure: INSERTION PORT-A-CATH WITH ULTRASOUND;  Surgeon: Rolm Bookbinder, MD;  Location: [place];  Service: General;  Laterality: N/A;  . WISDOM TOOTH EXTRACTION  AGE 44    There were no vitals filed for this visit.                      [place] Adult PT Treatment/Exercise - 12/11/19 0001      Exercises   Other Exercises  neck, upper thoracic and scapular ROM in preparation for MLD       Manual Therapy   Manual Therapy Edema management;Manual Lymphatic Drainage (MLD)    Manual therapy comments congestion perceived at right forearm     Edema Management attempted to begin compression bandaging and applied tg soft, elastomull to fingers 1-3 and thin foam to forearm with artiflex before pt decided she would not be able to take care of her kids and do her household tasks with compression bandaging.  She would rather order a velcro compression garment as a bandaging alternative that she could more easily take on and off.  Discussed that pt will likely need a flat knit compression sleeve as well.     Manual Lymphatic Drainage (MLD) in supine for short neck and superficial and deep abdominals, right inguinal nodes and right axillo-inguinal anastamosis, left axillary nodes and anterior interaxillary anastamosis and chest, then to left sidelying for  posterior interaxillay anastamosis being careful not to overstretch radiated skin  Extra time spent on forearn                    PT Short Term Goals - 03/31/19 1246      PT SHORT TERM GOAL #1   Title independent with initial HEP for left hip    Time 4    Period Weeks    Status Achieved      PT SHORT TERM GOAL #2   Title understand how to take care of vaginal health to reduce dryness and keep tissue mobility    Period Weeks    Status Achieved             PT Long Term Goals - 12/09/19 1942      PT LONG TERM GOAL #1   Title Pt will have reduction in L-Dex score to < 10    Baseline 14.5 on  12/09/2019    Time 4    Period Weeks    Status New      PT LONG TERM GOAL #2   Title Pt will report she is confident in her independence to use self MLD and use of compression to manage her lymphedema    Time 4    Period Weeks    Status New      PT LONG TERM GOAL #3   Title Pt will report she has retuned to gradually progressive resistive exercise for upper body to decrease lymphedema risk   Pt has stopped exercising to help her parents more   Time 4    Period Weeks    Status New                 Plan - 12/11/19 1653    Clinical Impression Statement Pt with mild palpable fullness in her forearm  and tightness noted in her right axilla.  Focus today on MLD.  Attempted to do compression bandaging but pt not ready for this today.  She would like to try a velcro wrap that she could don and doff easier when she has to take care of her children at home    Comorbidities chemo and radiation with cording and fascial tightness after    Examination-Activity Limitations Bathing;Reach Overhead;Caring for Others;Lift;Hygiene/Grooming;Locomotion Level;Sleep    Stability/Clinical Decision Making Evolving/Moderate complexity    Rehab Potential Good    PT Frequency 2x / week    PT Duration 4 weeks    PT Next Visit Plan Continue MLD.  See if pt has ordered velcro wrap.  consider teaching self bandaging.           Patient will benefit from skilled therapeutic intervention in order to improve the following deficits and impairments:  Decreased knowledge of precautions, Decreased knowledge of use of DME, Postural dysfunction, Decreased range of motion, Increased fascial restricitons, Impaired UE functional use, Increased muscle spasms, Impaired perceived functional ability, Pain, Decreased scar mobility, Decreased strength, Increased edema, Difficulty walking, Decreased activity tolerance, Decreased mobility  Visit Diagnosis: Postmastectomy lymphedema  Aftercare following surgery for  neoplasm  Abnormal posture  Disorder of the skin and subcutaneous tissue related to radiation, unspecified  Stiffness of right shoulder joint     Problem List Patient Active Problem List   Diagnosis Date Noted  . History of right breast cancer 11/14/2018  . Chemotherapy induced neutropenia ([place]) 09/13/2018  . Port-A-Cath in place 06/10/2018  . Malignant neoplasm of upper-outer quadrant of right breast in female, estrogen  receptor negative ([place]) 05/09/2018  . Low iron stores 01/16/2018  . Muscle spasm 01/15/2018  . Piriformis syndrome of left side 01/15/2018  . Grade I hemorrhoids 02/23/2017  . Low lying posterior placenta, antepartum 02/01/2016  . AMA (advanced maternal age) multigravida 35+ 11/22/2015  . 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 Calcification of right breast 02/19/2015  . Ligament tear 07/05/2012   Donato Heinz. Owens Shark PT  Norwood Levo 12/11/2019, 4:56 PM  [place], [place], [address] Phone: [phone]   Fax:  [phone]  Name: Darlene Grant MRN: [id number] Date of Birth: 23-May-1976

## 2019-12-15 ENCOUNTER — Encounter: Payer: Self-pay | Admitting: Physical Therapy

## 2019-12-16 ENCOUNTER — Ambulatory Visit: Payer: BC Managed Care – PPO

## 2019-12-16 ENCOUNTER — Other Ambulatory Visit: Payer: Self-pay

## 2019-12-16 DIAGNOSIS — L599 Disorder of the skin and subcutaneous tissue related to radiation, unspecified: Secondary | ICD-10-CM

## 2019-12-16 DIAGNOSIS — I972 Postmastectomy lymphedema syndrome: Secondary | ICD-10-CM | POA: Diagnosis not present

## 2019-12-16 DIAGNOSIS — Z483 Aftercare following surgery for neoplasm: Secondary | ICD-10-CM

## 2019-12-16 DIAGNOSIS — M25611 Stiffness of right shoulder, not elsewhere classified: Secondary | ICD-10-CM | POA: Diagnosis not present

## 2019-12-16 DIAGNOSIS — R293 Abnormal posture: Secondary | ICD-10-CM

## 2019-12-16 NOTE — Therapy (Signed)
[place], [place], [address] Phone: [phone]   Fax:  [phone]  Physical Therapy Treatment  Patient Details  Name: Darlene Grant MRN: [id number] Date of Birth: December 28, 1975 Referring Provider (PT): Dr. Nicholas Lose; Dr. Clovia Cuff   Encounter Date: 12/16/2019   PT End of Session - 12/16/19 1310    Visit Number 3    Number of Visits 9   7 so far in 2021   Date for PT Re-Evaluation 01/08/20    PT Start Time 0858    PT Stop Time 1003    PT Time Calculation (min) 65 min    Activity Tolerance Patient tolerated treatment well    Behavior During Therapy [facility] for tasks assessed/performed           Past Medical History:  Diagnosis Date  . Abnormal Pap smear    ASCUS 03/13/11  . Anemia   . Breast pain, left   . Cancer ([place])   . Candida vaginitis   . Complication of anesthesia    pt states that all narcotics make her angry and she would prefer to avoid them.  . Rectal itching   . Vaginal Pap smear, abnormal     Past Surgical History:  Procedure Laterality Date  . BREAST BIOPSY Right 8/16  . CESAREAN SECTION N/A 11/21/2013   Procedure: CESAREAN SECTION;  Surgeon: Alwyn Pea, MD;  Location: [facility];  Service: Obstetrics;  Laterality: N/A;  . CESAREAN SECTION N/A 06/23/2016   Procedure: CESAREAN SECTION;  Surgeon: Emily Filbert, MD;  Location: [facility];  Service: Obstetrics;  Laterality: N/A;  . HAND SURGERY  04/2012   ligament repair  . MASTECTOMY WITH AXILLARY LYMPH NODE DISSECTION Bilateral 11/14/2018   Procedure: RIGHT MASTECTOMY WITH RIGHT AXILLARY LYMPH NODE DISSECTION AND LEFT RISK REDUCING MASTECTOMY;  Surgeon: Rolm Bookbinder, MD;  Location: [facility];  Service: General;  Laterality: Bilateral;  LATE ENTRY: Corrected laterality documentation to read right axillary lymph node dissection vs. documented laterality of left.    Marland Kitchen PILONIDAL CYST EXCISION   2000  . PORTACATH PLACEMENT N/A 05/28/2018   Procedure: INSERTION PORT-A-CATH WITH ULTRASOUND;  Surgeon: Rolm Bookbinder, MD;  Location: [facility];  Service: General;  Laterality: N/A;  . WISDOM TOOTH EXTRACTION  AGE 27    There were no vitals filed for this visit.   Subjective Assessment - 12/16/19 0908    Subjective I have a few questions about which sleeve to wear when.    Pertinent History breast cancer diagnosed November 2019: triple negative with Ki of 75% She completed chemotherapy 10/18/2018. 11/14/2018: she underwent bilateral mastectomy with 0/13 nodes positive on the right; radiation ended on 02/10/2019    Patient Stated Goals to not get lymphedema    Currently in Pain? No/denies                             [facility] Adult PT Treatment/Exercise - 12/16/19 0001      Manual Therapy   Manual Therapy Edema management;Manual Lymphatic Drainage (MLD);Compression Bandaging    Edema Management Pt brought her Juxtafit and this was a good fit. Assisted her with donning and using compression gauge card instructing with this. Pt was trying to wear closer to 30-40 mmHg but reporting this uncomfortable, so educated that even, consistent pressure more important than high, uncomfortable pressure.  She was comfortable with closer to 20-30 mmHg  pressure. Donned pts compression sleeve and gauntlet at end of session.     Manual Lymphatic Drainage (MLD) in supine for short neck and superficial and deep abdominals, right inguinal nodes and right axillo-inguinal anastamosis, left axillary nodes and anterior interaxillary anastamosis; then focused on Rt UE working from lateral upper arm, medial to lateral, and lateral again, then bil forearm and focusing here when retracing steps, then dorsum of hand retracing all steps reviewing technique with pt.     Compression Bandaging Briefly instructed pt in 6 cm short stretch compression bandage to hand so pt could have this today as she  reports wearing gauntlet is uncomfortable and not sure if she wants to try hand velcro. Will need further reinforcement of this.                     PT Short Term Goals - 03/31/19 1246      PT SHORT TERM GOAL #1   Title independent with initial HEP for left hip    Time 4    Period Weeks    Status Achieved      PT SHORT TERM GOAL #2   Title understand how to take care of vaginal health to reduce dryness and keep tissue mobility    Period Weeks    Status Achieved             PT Long Term Goals - 12/09/19 1942      PT LONG TERM GOAL #1   Title Pt will have reduction in L-Dex score to < 10    Baseline 14.5 on 12/09/2019    Time 4    Period Weeks    Status New      PT LONG TERM GOAL #2   Title Pt will report she is confident in her independence to use self MLD and use of compression to manage her lymphedema    Time 4    Period Weeks    Status New      PT LONG TERM GOAL #3   Title Pt will report she has retuned to gradually progressive resistive exercise for upper body to decrease lymphedema risk   Pt has stopped exercising to help her parents more   Time 4    Period Weeks    Status New                 Plan - 12/16/19 1312    Clinical Impression Statement Pt came in today wearing her compression sleeve and gauntlet and brought her Juxtafit. Assessed fit of this and educated her that she doesn't have to have high compression since uncomfortable as garment will be most effective the longer she can tolerate wearing it. More important is less pressure (she was comfortable with 20-30 mmHg but was trying to wear it at 30-40 mmHg but could only tolerate shorter wear time) that she can tolerate wearing longer. Also briefly instructed her in compression bandaging for hand but will need further instruction. Then focused on manual lymph drainage to Rt UE, especially at forearm, reviewing with pt throughout and answered her questions about basic MLD principles as well.  Also explained benefits of flat knit now vs circular knit, and how better containing flat knit will be more beneficial for her lymphedema at this stage. She verbalized understanding all by end of session.    Personal Factors and Comorbidities Other    Comorbidities chemo and radiation with cording and fascial tightness after    Examination-Activity Limitations  Bathing;Reach Overhead;Caring for Others;Lift;Hygiene/Grooming;Locomotion Level;Sleep    Stability/Clinical Decision Making Evolving/Moderate complexity    Rehab Potential Good    PT Frequency 2x / week    PT Duration 4 weeks    PT Treatment/Interventions ADLs/Self Care Home Management;Therapeutic exercise;Manual techniques;Therapeutic activities;Neuromuscular re-education;Patient/family education;Passive range of motion;Manual lymph drainage;Taping;Compression bandaging;Orthotic Fit/Training    PT Next Visit Plan Continue MLD. Does pt need assist with ordering Medi Mondi? Cont teaching self bandaging for hand having pt return demo.    Consulted and Agree with Plan of Care Patient           Patient will benefit from skilled therapeutic intervention in order to improve the following deficits and impairments:  Decreased knowledge of precautions, Decreased knowledge of use of DME, Postural dysfunction, Decreased range of motion, Increased fascial restricitons, Impaired UE functional use, Increased muscle spasms, Impaired perceived functional ability, Pain, Decreased scar mobility, Decreased strength, Increased edema, Difficulty walking, Decreased activity tolerance, Decreased mobility  Visit Diagnosis: Postmastectomy lymphedema  Aftercare following surgery for neoplasm  Abnormal posture  Disorder of the skin and subcutaneous tissue related to radiation, unspecified     Problem List Patient Active Problem List   Diagnosis Date Noted  . History of right breast cancer 11/14/2018  . Chemotherapy induced neutropenia ([place]) 09/13/2018    . Port-A-Cath in place 06/10/2018  . Malignant neoplasm of upper-outer quadrant of right breast in female, estrogen receptor negative ([place]) 05/09/2018  . Low iron stores 01/16/2018  . Muscle spasm 01/15/2018  . Piriformis syndrome of left side 01/15/2018  . Grade I hemorrhoids 02/23/2017  . Low lying posterior placenta, antepartum 02/01/2016  . AMA (advanced maternal age) multigravida 35+ 11/22/2015  . 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 Calcification of right breast 02/19/2015  . Ligament tear 07/05/2012    Otelia Limes, PTA 12/16/2019, 1:21 PM  [place], [place], [address] Phone: [phone]   Fax:  [phone]  Name: Darlene Grant MRN: [id number] Date of Birth: Sep 15, 1975

## 2019-12-18 ENCOUNTER — Other Ambulatory Visit: Payer: Self-pay

## 2019-12-18 ENCOUNTER — Ambulatory Visit: Payer: BC Managed Care – PPO | Attending: General Surgery

## 2019-12-18 DIAGNOSIS — R293 Abnormal posture: Secondary | ICD-10-CM | POA: Diagnosis not present

## 2019-12-18 DIAGNOSIS — I972 Postmastectomy lymphedema syndrome: Secondary | ICD-10-CM | POA: Insufficient documentation

## 2019-12-18 DIAGNOSIS — Z483 Aftercare following surgery for neoplasm: Secondary | ICD-10-CM | POA: Insufficient documentation

## 2019-12-18 DIAGNOSIS — L599 Disorder of the skin and subcutaneous tissue related to radiation, unspecified: Secondary | ICD-10-CM | POA: Diagnosis not present

## 2019-12-18 NOTE — Patient Instructions (Signed)
Deep Effective Breath ° ° °Standing, sitting, or laying down, place both hands on the belly. Take a deep breath IN, expanding the belly; then breath OUT, contracting the belly. °Repeat __5__ times. Do __2-3__ sessions per day and before your self massage. ° °http://gt2.exer.us/866  ° °Copyright © VHI. All rights reserved.  °Axilla to Axilla - Sweep ° ° °On uninvolved side make 5 circles in the armpit, then pump _5__ times from involved armpit across chest to uninvolved armpit, making a pathway. °Do _1__ time per day. ° °Copyright © VHI. All rights reserved.  °Axilla to Inguinal Nodes - Sweep ° ° °On involved side, make 5 circles at groin at panty line, then pump _5__ times from armpit along side of trunk to outer hip, making your other pathway. °Do __1_ time per day. ° °Copyright © VHI. All rights reserved.  °Arm Posterior: Elbow to Shoulder - Sweep ° ° °Pump _5__ times from back of elbow to top of shoulder. Then inner to outer upper arm _5_ times, then outer arm again _5_ times. Then back to the pathways _2-3_ times. °Do _1__ time per day. ° °Copyright © VHI. All rights reserved.  °ARM: [place] Wrist to Elbow - Sweep ° ° °Pump or stationary circles _5__ times from wrist to elbow making sure to do both sides of the forearm. Then retrace your steps to the outer arm, and the pathways _2-3_ times each. °Do _1__ time per day. ° °Copyright © VHI. All rights reserved.  °ARM: Dorsum of Hand to Shoulder - Sweep ° ° °Pump or stationary circles _5__ times on back of hand including knuckle spaces and individual fingers if needed working up towards the wrist, then retrace all your steps working back up the forearm, doing both sides; upper outer arm and back to your pathways _2-3_ times each. Then do 5 circles again at uninvolved armpit and involved groin where you started! Good job!! °Do __1_ time per day. °

## 2019-12-18 NOTE — Therapy (Signed)
[place], [place], [address] Phone: [phone]   Fax:  [phone]  Physical Therapy Treatment  Patient Details  Name: Darlene Grant MRN: [id number] Date of Birth: 01-17-1976 Referring Provider (PT): Dr. Nicholas Lose; Dr. Clovia Cuff   Encounter Date: 12/18/2019   PT End of Session - 12/18/19 1118    Visit Number 4    Number of Visits 9   8 so far in 2021   Date for PT Re-Evaluation 01/08/20    Authorization Type [employer]    PT Start Time 1008    PT Stop Time 1111    PT Time Calculation (min) 63 min    Activity Tolerance Patient tolerated treatment well    Behavior During Therapy [facility] for tasks assessed/performed           Past Medical History:  Diagnosis Date  . Abnormal Pap smear    ASCUS 03/13/11  . Anemia   . Breast pain, left   . Cancer ([place])   . Candida vaginitis   . Complication of anesthesia    pt states that all narcotics make her angry and she would prefer to avoid them.  . Rectal itching   . Vaginal Pap smear, abnormal     Past Surgical History:  Procedure Laterality Date  . BREAST BIOPSY Right 8/16  . CESAREAN SECTION N/A 11/21/2013   Procedure: CESAREAN SECTION;  Surgeon: Alwyn Pea, MD;  Location: [facility];  Service: Obstetrics;  Laterality: N/A;  . CESAREAN SECTION N/A 06/23/2016   Procedure: CESAREAN SECTION;  Surgeon: Emily Filbert, MD;  Location: [facility];  Service: Obstetrics;  Laterality: N/A;  . HAND SURGERY  04/2012   ligament repair  . MASTECTOMY WITH AXILLARY LYMPH NODE DISSECTION Bilateral 11/14/2018   Procedure: RIGHT MASTECTOMY WITH RIGHT AXILLARY LYMPH NODE DISSECTION AND LEFT RISK REDUCING MASTECTOMY;  Surgeon: Rolm Bookbinder, MD;  Location: [facility];  Service: General;  Laterality: Bilateral;  LATE ENTRY: Corrected laterality documentation to read right axillary lymph node dissection vs. documented laterality of left.    Marland Kitchen  PILONIDAL CYST EXCISION  2000  . PORTACATH PLACEMENT N/A 05/28/2018   Procedure: INSERTION PORT-A-CATH WITH ULTRASOUND;  Surgeon: Rolm Bookbinder, MD;  Location: [place] [facility];  Service: General;  Laterality: N/A;  . WISDOM TOOTH EXTRACTION  AGE 78    There were no vitals filed for this visit.   Subjective Assessment - 12/18/19 1116    Subjective I got measured for a flat knit compressio sleeve and glove Tuesday. I plan to order more once I can see if this one fits me good. I've been trying the self MLD but need more review. Wearing the Juxtafit at night and circular knit during the day and I can tell some improvement in my forearm with use of consistent compression.    Pertinent History breast cancer diagnosed November 2019: triple negative with Ki of 75% She completed chemotherapy 10/18/2018. 11/14/2018: she underwent bilateral mastectomy with 0/13 nodes positive on the right; radiation ended on 02/10/2019    Patient Stated Goals to not get lymphedema    Currently in Pain? No/denies                             [facility] Adult PT Treatment/Exercise - 12/18/19 0001      Manual Therapy   Manual Lymphatic Drainage (MLD) in supine for short neck and superficial and  deep abdominals, right inguinal nodes and right axillo-inguinal anastamosis, left axillary nodes and anterior interaxillary anastamosis; then focused on Rt UE working from lateral upper arm, medial to lateral, and lateral again, then bil forearm and focusing here when retracing steps, then dorsum of hand retracing all steps reviewing technique with pt throughout having her return demo    Compression Bandaging briefly reviewed technique with pt for hand bandage verbally as we ran out of time to perform this due to increased time spent instructing in MLD                  PT Education - 12/18/19 1118    Education Details Continued reviewing self MLD while therapist performed today and had pt return demo.     Person(s) Educated Patient    Methods Explanation;Demonstration;Handout    Comprehension Verbalized understanding;Returned demonstration;Need further instruction            PT Short Term Goals - 03/31/19 1246      PT SHORT TERM GOAL #1   Title independent with initial HEP for left hip    Time 4    Period Weeks    Status Achieved      PT SHORT TERM GOAL #2   Title understand how to take care of vaginal health to reduce dryness and keep tissue mobility    Period Weeks    Status Achieved             PT Long Term Goals - 12/09/19 1942      PT LONG TERM GOAL #1   Title Pt will have reduction in L-Dex score to < 10    Baseline 14.5 on 12/09/2019    Time 4    Period Weeks    Status New      PT LONG TERM GOAL #2   Title Pt will report she is confident in her independence to use self MLD and use of compression to manage her lymphedema    Time 4    Period Weeks    Status New      PT LONG TERM GOAL #3   Title Pt will report she has retuned to gradually progressive resistive exercise for upper body to decrease lymphedema risk   Pt has stopped exercising to help her parents more   Time 4    Period Weeks    Status New                 Plan - 12/18/19 1119    Clinical Impression Statement Overall pt is making good progress thus far. She had started practicing self MLD after last session but required further review today for technique and for review of sequencing of steps. She was able to verbalize a better understanding and return demo of both by end of session but will benefit from further review. Did not have time to sufficiently instruct/review hand bandging technique today so she will also benefit from further instruction of this. Due to more consistent compression as of late she is demonstrating less fullness, visibly and palpably so, in her forearm. She was measured for flat knit compression Tuesday showing further progress towards managing of her lymphedema  symptoms.    Personal Factors and Comorbidities Other    Comorbidities chemo and radiation with cording and fascial tightness after    Examination-Activity Limitations Bathing;Reach Overhead;Caring for Others;Lift;Hygiene/Grooming;Locomotion Level;Sleep    Stability/Clinical Decision Making Evolving/Moderate complexity    Rehab Potential Good    PT Frequency 2x /  week    PT Duration 4 weeks    PT Treatment/Interventions ADLs/Self Care Home Management;Therapeutic exercise;Manual techniques;Therapeutic activities;Neuromuscular re-education;Patient/family education;Passive range of motion;Manual lymph drainage;Taping;Compression bandaging;Orthotic Fit/Training    PT Next Visit Plan Continue MLD with review prn, and instruct, having her return demo, of hand bandage. Assess fit of flat knit once this arrives and pt brings.    Consulted and Agree with Plan of Care Patient           Patient will benefit from skilled therapeutic intervention in order to improve the following deficits and impairments:  Decreased knowledge of precautions, Decreased knowledge of use of DME, Postural dysfunction, Decreased range of motion, Increased fascial restricitons, Impaired UE functional use, Increased muscle spasms, Impaired perceived functional ability, Pain, Decreased scar mobility, Decreased strength, Increased edema, Difficulty walking, Decreased activity tolerance, Decreased mobility  Visit Diagnosis: Postmastectomy lymphedema  Aftercare following surgery for neoplasm  Abnormal posture  Disorder of the skin and subcutaneous tissue related to radiation, unspecified     Problem List Patient Active Problem List   Diagnosis Date Noted  . History of right breast cancer 11/14/2018  . Chemotherapy induced neutropenia ([place]) 09/13/2018  . Port-A-Cath in place 06/10/2018  . Malignant neoplasm of upper-outer quadrant of right breast in female, estrogen receptor negative ([place]) 05/09/2018  . Low iron stores  01/16/2018  . Muscle spasm 01/15/2018  . Piriformis syndrome of left side 01/15/2018  . Grade I hemorrhoids 02/23/2017  . Low lying posterior placenta, antepartum 02/01/2016  . AMA (advanced maternal age) multigravida 35+ 11/22/2015  . 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 Calcification of right breast 02/19/2015  . Ligament tear 07/05/2012    Otelia Limes, PTA 12/18/2019, 11:26 AM  [place], [place], [address] Phone: [phone]   Fax:  [phone]  Name: Darlene Grant MRN: [id number] Date of Birth: 20-Aug-1975

## 2019-12-23 ENCOUNTER — Ambulatory Visit: Payer: BC Managed Care – PPO | Admitting: Physical Therapy

## 2019-12-23 ENCOUNTER — Other Ambulatory Visit: Payer: Self-pay

## 2019-12-23 ENCOUNTER — Encounter: Payer: Self-pay | Admitting: Physical Therapy

## 2019-12-23 DIAGNOSIS — L599 Disorder of the skin and subcutaneous tissue related to radiation, unspecified: Secondary | ICD-10-CM

## 2019-12-23 DIAGNOSIS — R293 Abnormal posture: Secondary | ICD-10-CM | POA: Diagnosis not present

## 2019-12-23 DIAGNOSIS — Z483 Aftercare following surgery for neoplasm: Secondary | ICD-10-CM | POA: Diagnosis not present

## 2019-12-23 DIAGNOSIS — I972 Postmastectomy lymphedema syndrome: Secondary | ICD-10-CM

## 2019-12-23 NOTE — Therapy (Signed)
[place], [place], [address] Phone: [phone]   Fax:  [phone]  Physical Therapy Treatment  Patient Details  Name: Darlene Grant MRN: [id number] Date of Birth: 1976/04/24 Referring Provider (PT): Dr. Nicholas Lose; Dr. Clovia Cuff   Encounter Date: 12/23/2019   PT End of Session - 12/23/19 1730    Visit Number 5    Number of Visits 9    Date for PT Re-Evaluation 01/08/20    PT Start Time 6962    PT Stop Time 1500    PT Time Calculation (min) 55 min    Activity Tolerance Patient tolerated treatment well    Behavior During Therapy [facility] for tasks assessed/performed           Past Medical History:  Diagnosis Date  . Abnormal Pap smear    ASCUS 03/13/11  . Anemia   . Breast pain, left   . Cancer ([place])   . Candida vaginitis   . Complication of anesthesia    pt states that all narcotics make her angry and she would prefer to avoid them.  . Rectal itching   . Vaginal Pap smear, abnormal     Past Surgical History:  Procedure Laterality Date  . BREAST BIOPSY Right 8/16  . CESAREAN SECTION N/A 11/21/2013   Procedure: CESAREAN SECTION;  Surgeon: Alwyn Pea, MD;  Location: [facility];  Service: Obstetrics;  Laterality: N/A;  . CESAREAN SECTION N/A 06/23/2016   Procedure: CESAREAN SECTION;  Surgeon: Emily Filbert, MD;  Location: [place];  Service: Obstetrics;  Laterality: N/A;  . HAND SURGERY  04/2012   ligament repair  . MASTECTOMY WITH AXILLARY LYMPH NODE DISSECTION Bilateral 11/14/2018   Procedure: RIGHT MASTECTOMY WITH RIGHT AXILLARY LYMPH NODE DISSECTION AND LEFT RISK REDUCING MASTECTOMY;  Surgeon: Rolm Bookbinder, MD;  Location: [facility];  Service: General;  Laterality: Bilateral;  LATE ENTRY: Corrected laterality documentation to read right axillary lymph node dissection vs. documented laterality of left.    Marland Kitchen PILONIDAL CYST EXCISION  2000  . PORTACATH  PLACEMENT N/A 05/28/2018   Procedure: INSERTION PORT-A-CATH WITH ULTRASOUND;  Surgeon: Rolm Bookbinder, MD;  Location: [facility];  Service: General;  Laterality: N/A;  . WISDOM TOOTH EXTRACTION  AGE 44    There were no vitals filed for this visit.   Subjective Assessment - 12/23/19 1407    Subjective Pt says she has been wearing her circular knit during the day and the circaid juxtafit at night.  She decided not get the hand piece and will wrap her hand .    Pertinent History breast cancer diagnosed November 2019: triple negative with Ki of 75% She completed chemotherapy 10/18/2018. 11/14/2018: she underwent bilateral mastectomy with 0/13 nodes positive on the right; radiation ended on 02/10/2019    Currently in Pain? Yes    Pain Score 4     Pain Location Hip    Pain Orientation Left    Pain Descriptors / Indicators Sore    Pain Type Chronic pain                             [facility] Adult PT Treatment/Exercise - 12/23/19 0001      Exercises   Other Exercises  neck, upper thoracic and scapular ROM in preparation for MLD       Manual Therapy   Manual Therapy Edema management;Myofascial release;Manual Lymphatic Drainage (MLD)  Edema Management provided small dotted foam to put inside gauntlet at back of hand     Myofascial Release to anterior chest and axilla     Manual Lymphatic Drainage (MLD) in supine for short neck and superficial and deep abdominals, right inguinal nodes and right axillo-inguinal anastamosis, left axillary nodes and anterior interaxillary anastamosis; then focused on Rt UE working from lateral upper arm, medial to lateral, and lateral again, then bil forearm and focusing here when retracing steps, then dorsum of hand retracing all steps reviewing technique with pt throughout having her return demo                    PT Short Term Goals - 03/31/19 1246      PT SHORT TERM GOAL #1   Title independent with initial HEP for  left hip    Time 4    Period Weeks    Status Achieved      PT SHORT TERM GOAL #2   Title understand how to take care of vaginal health to reduce dryness and keep tissue mobility    Period Weeks    Status Achieved             PT Long Term Goals - 12/09/19 1942      PT LONG TERM GOAL #1   Title Pt will have reduction in L-Dex score to < 10    Baseline 14.5 on 12/09/2019    Time 4    Period Weeks    Status New      PT LONG TERM GOAL #2   Title Pt will report she is confident in her independence to use self MLD and use of compression to manage her lymphedema    Time 4    Period Weeks    Status New      PT LONG TERM GOAL #3   Title Pt will report she has retuned to gradually progressive resistive exercise for upper body to decrease lymphedema risk   Pt has stopped exercising to help her parents more   Time 4    Period Weeks    Status New                 Plan - 12/23/19 1738    Clinical Impression Statement Focused on tightness in chest today as well as MLD to right arm    Personal Factors and Comorbidities Other    Comorbidities chemo and radiation with cording and fascial tightness after    Examination-Activity Limitations Bathing;Reach Overhead;Caring for Others;Lift;Hygiene/Grooming;Locomotion Level;Sleep    Stability/Clinical Decision Making Evolving/Moderate complexity    Rehab Potential Good    PT Frequency 2x / week    PT Duration 4 weeks    PT Treatment/Interventions ADLs/Self Care Home Management;Therapeutic exercise;Manual techniques;Therapeutic activities;Neuromuscular re-education;Patient/family education;Passive range of motion;Manual lymph drainage;Taping;Compression bandaging;Orthotic Fit/Training    PT Next Visit Plan Continue MLD with review prn, and instruct, having her return demo, of hand bandage. Assess fit of flat knit once this arrives and pt brings.    Consulted and Agree with Plan of Care Patient           Patient will benefit from  skilled therapeutic intervention in order to improve the following deficits and impairments:  Decreased knowledge of precautions, Decreased knowledge of use of DME, Postural dysfunction, Decreased range of motion, Increased fascial restricitons, Impaired UE functional use, Increased muscle spasms, Impaired perceived functional ability, Pain, Decreased scar mobility, Decreased strength, Increased edema, Difficulty walking, Decreased activity  tolerance, Decreased mobility  Visit Diagnosis: Postmastectomy lymphedema  Abnormal posture  Disorder of the skin and subcutaneous tissue related to radiation, unspecified     Problem List Patient Active Problem List   Diagnosis Date Noted  . History of right breast cancer 11/14/2018  . Chemotherapy induced neutropenia ([place]) 09/13/2018  . Port-A-Cath in place 06/10/2018  . Malignant neoplasm of upper-outer quadrant of right breast in female, estrogen receptor negative ([place]) 05/09/2018  . Low iron stores 01/16/2018  . Muscle spasm 01/15/2018  . Piriformis syndrome of left side 01/15/2018  . Grade I hemorrhoids 02/23/2017  . Low lying posterior placenta, antepartum 02/01/2016  . AMA (advanced maternal age) multigravida 35+ 11/22/2015  . 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 Calcification of right breast 02/19/2015  . Ligament tear 07/05/2012   Donato Heinz. Owens Shark PT  Norwood Levo 12/23/2019, 5:40 PM  [place], [place], [address] Phone: [phone]   Fax:  [phone]  Name: Darlene Grant MRN: [id number] Date of Birth: 08-Mar-1976

## 2019-12-24 ENCOUNTER — Encounter: Payer: Self-pay | Admitting: Physical Therapy

## 2019-12-24 ENCOUNTER — Telehealth: Payer: Self-pay | Admitting: Hematology and Oncology

## 2019-12-24 NOTE — Telephone Encounter (Signed)
Ok with me 

## 2019-12-24 NOTE — Telephone Encounter (Signed)
Patient states that Darlene Grant is her PCP. Would like to now switch to see Dr.Alexander and wants to make a physical appointment. Please advise if this would be okay to switch, patient did not give a reason, just stated she wanted to switch.

## 2019-12-25 ENCOUNTER — Other Ambulatory Visit: Payer: Self-pay

## 2019-12-25 ENCOUNTER — Ambulatory Visit: Payer: BC Managed Care – PPO

## 2019-12-25 DIAGNOSIS — Z483 Aftercare following surgery for neoplasm: Secondary | ICD-10-CM | POA: Diagnosis not present

## 2019-12-25 DIAGNOSIS — I972 Postmastectomy lymphedema syndrome: Secondary | ICD-10-CM | POA: Diagnosis not present

## 2019-12-25 DIAGNOSIS — R293 Abnormal posture: Secondary | ICD-10-CM

## 2019-12-25 DIAGNOSIS — L599 Disorder of the skin and subcutaneous tissue related to radiation, unspecified: Secondary | ICD-10-CM

## 2019-12-25 NOTE — Therapy (Signed)
[place], [place], [address] Phone: [phone]   Fax:  [phone]  Physical Therapy Treatment  Patient Details  Name: Darlene Grant MRN: [id number] Date of Birth: 1976/01/15 Referring Provider (PT): Dr. Nicholas Lose; Dr. Clovia Cuff   Encounter Date: 12/25/2019   PT End of Session - 12/25/19 1006    Visit Number 6    Number of Visits 9    Date for PT Re-Evaluation 01/08/20    Authorization Type BCBS    PT Start Time 0904    PT Stop Time 1007    PT Time Calculation (min) 63 min    Activity Tolerance Patient tolerated treatment well    Behavior During Therapy [facility] for tasks assessed/performed           Past Medical History:  Diagnosis Date  . Abnormal Pap smear    ASCUS 03/13/11  . Anemia   . Breast pain, left   . Cancer ([place])   . Candida vaginitis   . Complication of anesthesia    pt states that all narcotics make her angry and she would prefer to avoid them.  . Rectal itching   . Vaginal Pap smear, abnormal     Past Surgical History:  Procedure Laterality Date  . BREAST BIOPSY Right 8/16  . CESAREAN SECTION N/A 11/21/2013   Procedure: CESAREAN SECTION;  Surgeon: Alwyn Pea, MD;  Location: [facility];  Service: Obstetrics;  Laterality: N/A;  . CESAREAN SECTION N/A 06/23/2016   Procedure: CESAREAN SECTION;  Surgeon: Emily Filbert, MD;  Location: [facility];  Service: Obstetrics;  Laterality: N/A;  . HAND SURGERY  04/2012   ligament repair  . MASTECTOMY WITH AXILLARY LYMPH NODE DISSECTION Bilateral 11/14/2018   Procedure: RIGHT MASTECTOMY WITH RIGHT AXILLARY LYMPH NODE DISSECTION AND LEFT RISK REDUCING MASTECTOMY;  Surgeon: Rolm Bookbinder, MD;  Location: [place];  Service: General;  Laterality: Bilateral;  LATE ENTRY: Corrected laterality documentation to read right axillary lymph node dissection vs. documented laterality of left.    Marland Kitchen PILONIDAL CYST  EXCISION  2000  . PORTACATH PLACEMENT N/A 05/28/2018   Procedure: INSERTION PORT-A-CATH WITH ULTRASOUND;  Surgeon: Rolm Bookbinder, MD;  Location: [place];  Service: General;  Laterality: N/A;  . WISDOM TOOTH EXTRACTION  AGE 44    There were no vitals filed for this visit.   Subjective Assessment - 12/25/19 0912    Subjective Darlene Grant really focused on MFR to my lateral chest last time and I continued it on myself yesterday and I could feel the tight skin release! And then the arm swelling feels better today as well.    Pertinent History breast cancer diagnosed November 2019: triple negative with Ki of 75% She completed chemotherapy 10/18/2018. 11/14/2018: she underwent bilateral mastectomy with 0/13 nodes positive on the right; radiation ended on 02/10/2019    Patient Stated Goals to not get lymphedema    Currently in Pain? Yes   and neck is sore today   Pain Score 3     Pain Location Hip    Pain Orientation Left    Pain Descriptors / Indicators Sore    Pain Type Chronic pain    Pain Onset More than a month ago    Pain Frequency Intermittent    Aggravating Factors  haven't been able to walk and stretch as much as I want    Pain Relieving Factors stretching  LYMPHEDEMA/ONCOLOGY QUESTIONNAIRE - 12/25/19 0001      Right Upper Extremity Lymphedema   10 cm Proximal to Olecranon Process 26.1 cm    Olecranon Process 23.9 cm    15 cm Proximal to Ulnar Styloid Process 23.8 cm    Just Proximal to Ulnar Styloid Process 15.8 cm    Across Hand at [employer] 18.2 cm    At [place] of 2nd Digit 5.9 cm                      [facility] Adult PT Treatment/Exercise - 12/25/19 0001      Manual Therapy   Edema Management Issued 2 thick stockinette sleeves for pt to wear under her Juxtafit (has 1 to wash and 1 to wear) and remeasured circumference    Myofascial Release to anterior chest and axilla, especially at area of cording palpable today which  improved by end of session    Manual Lymphatic Drainage (MLD) in supine for short neck and superficial and deep abdominals, right inguinal nodes and right axillo-inguinal anastamosis, left axillary nodes and anterior interaxillary anastamosis; then focused on Rt UE working from lateral upper arm, medial to lateral, and lateral again, then ant/post forearm and focusing here when retracing steps, then dorsum of hand retracing all steps reviewing technique with pt throughout having her return demo                    PT Short Term Goals - 03/31/19 1246      PT SHORT TERM GOAL #1   Title independent with initial HEP for left hip    Time 4    Period Weeks    Status Achieved      PT SHORT TERM GOAL #2   Title understand how to take care of vaginal health to reduce dryness and keep tissue mobility    Period Weeks    Status Achieved             PT Long Term Goals - 12/09/19 1942      PT LONG TERM GOAL #1   Title Pt will have reduction in L-Dex score to < 10    Baseline 14.5 on 12/09/2019    Time 4    Period Weeks    Status New      PT LONG TERM GOAL #2   Title Pt will report she is confident in her independence to use self MLD and use of compression to manage her lymphedema    Time 4    Period Weeks    Status New      PT LONG TERM GOAL #3   Title Pt will report she has retuned to gradually progressive resistive exercise for upper body to decrease lymphedema risk   Pt has stopped exercising to help her parents more   Time 4    Period Weeks    Status New                 Plan - 12/25/19 1210    Clinical Impression Statement Continued with MLD to Rt arm and working to decrease tightness at Rt axilla to chest where 1 cord was palpable. This was much decreased by end of session and pt reported feeling less tight here.    Personal Factors and Comorbidities Other    Comorbidities chemo and radiation with cording and fascial tightness after    Examination-Activity  Limitations Bathing;Reach Overhead;Caring for Others;Lift;Hygiene/Grooming;Locomotion Level;Sleep    Stability/Clinical Decision  Making Evolving/Moderate complexity    Rehab Potential Good    PT Frequency 2x / week    PT Duration 4 weeks    PT Treatment/Interventions ADLs/Self Care Home Management;Therapeutic exercise;Manual techniques;Therapeutic activities;Neuromuscular re-education;Patient/family education;Passive range of motion;Manual lymph drainage;Taping;Compression bandaging;Orthotic Fit/Training    PT Next Visit Plan Continue MLD with review prn, and instruct, having her return demo, of hand bandage. Assess fit of flat knit once this arrives and pt brings.    Consulted and Agree with Plan of Care Patient           Patient will benefit from skilled therapeutic intervention in order to improve the following deficits and impairments:  Decreased knowledge of precautions, Decreased knowledge of use of DME, Postural dysfunction, Decreased range of motion, Increased fascial restricitons, Impaired UE functional use, Increased muscle spasms, Impaired perceived functional ability, Pain, Decreased scar mobility, Decreased strength, Increased edema, Difficulty walking, Decreased activity tolerance, Decreased mobility  Visit Diagnosis: Postmastectomy lymphedema  Abnormal posture  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 History of right breast cancer 11/14/2018  . Chemotherapy induced neutropenia ([place]) 09/13/2018  . Port-A-Cath in place 06/10/2018  . Malignant neoplasm of upper-outer quadrant of right breast in female, estrogen receptor negative ([place]) 05/09/2018  . Low iron stores 01/16/2018  . Muscle spasm 01/15/2018  . Piriformis syndrome of left side 01/15/2018  . Grade I hemorrhoids 02/23/2017  . Low lying posterior placenta,  antepartum 02/01/2016  . AMA (advanced maternal age) multigravida 35+ 11/22/2015  . 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 Calcification of right breast 02/19/2015  . Ligament tear 07/05/2012    Otelia Limes, PTA 12/25/2019, 12:15 PM  [place], [place], [address] Phone: [phone]   Fax:  [phone]  Name: Darlene Grant MRN: [id number] Date of Birth: 03/25/1976

## 2019-12-26 NOTE — Telephone Encounter (Signed)
Needs to give a reason. I did a physical for her a few years ago while Luvenia Starch was out, if she just feels more comfortable with me or wants a physician instead of a PA then that's fine but I don't accept transfers "just because"

## 2019-12-26 NOTE — Telephone Encounter (Signed)
Left a vm msg for pt to return a call back to the clinic regarding switching providers. Direct call back info provided.

## 2019-12-30 ENCOUNTER — Ambulatory Visit: Payer: BC Managed Care – PPO

## 2019-12-30 ENCOUNTER — Other Ambulatory Visit: Payer: Self-pay

## 2019-12-30 DIAGNOSIS — Z483 Aftercare following surgery for neoplasm: Secondary | ICD-10-CM

## 2019-12-30 DIAGNOSIS — L599 Disorder of the skin and subcutaneous tissue related to radiation, unspecified: Secondary | ICD-10-CM | POA: Diagnosis not present

## 2019-12-30 DIAGNOSIS — R293 Abnormal posture: Secondary | ICD-10-CM

## 2019-12-30 DIAGNOSIS — I972 Postmastectomy lymphedema syndrome: Secondary | ICD-10-CM

## 2019-12-30 NOTE — Therapy (Signed)
[place], [place], [address] Phone: [phone]   Fax:  [phone]  Physical Therapy Treatment  Patient Details  Name: Darlene Grant MRN: [id number] Date of Birth: Mar 24, 1976 Referring Provider (PT): Dr. Nicholas Lose; Dr. Clovia Cuff   Encounter Date: 12/30/2019   PT End of Session - 12/30/19 1032    Visit Number 7    Number of Visits 9    Date for PT Re-Evaluation 01/08/20    Authorization Type BCBS    PT Start Time 0905    PT Stop Time 1005    PT Time Calculation (min) 60 min    Activity Tolerance Patient tolerated treatment well    Behavior During Therapy [facility] for tasks assessed/performed           Past Medical History:  Diagnosis Date  . Abnormal Pap smear    ASCUS 03/13/11  . Anemia   . Breast pain, left   . Cancer ([place])   . Candida vaginitis   . Complication of anesthesia    pt states that all narcotics make her angry and she would prefer to avoid them.  . Rectal itching   . Vaginal Pap smear, abnormal     Past Surgical History:  Procedure Laterality Date  . BREAST BIOPSY Right 8/16  . CESAREAN SECTION N/A 11/21/2013   Procedure: CESAREAN SECTION;  Surgeon: Alwyn Pea, MD;  Location: [facility];  Service: Obstetrics;  Laterality: N/A;  . CESAREAN SECTION N/A 06/23/2016   Procedure: CESAREAN SECTION;  Surgeon: Emily Filbert, MD;  Location: [facility];  Service: Obstetrics;  Laterality: N/A;  . HAND SURGERY  04/2012   ligament repair  . MASTECTOMY WITH AXILLARY LYMPH NODE DISSECTION Bilateral 11/14/2018   Procedure: RIGHT MASTECTOMY WITH RIGHT AXILLARY LYMPH NODE DISSECTION AND LEFT RISK REDUCING MASTECTOMY;  Surgeon: Rolm Bookbinder, MD;  Location: [facility];  Service: General;  Laterality: Bilateral;  LATE ENTRY: Corrected laterality documentation to read right axillary lymph node dissection vs. documented laterality of left.    Marland Kitchen PILONIDAL CYST  EXCISION  2000  . PORTACATH PLACEMENT N/A 05/28/2018   Procedure: INSERTION PORT-A-CATH WITH ULTRASOUND;  Surgeon: Rolm Bookbinder, MD;  Location: [place];  Service: General;  Laterality: N/A;  . WISDOM TOOTH EXTRACTION  AGE 44    There were no vitals filed for this visit.   Subjective Assessment - 12/30/19 0908    Subjective My forearm has a natural shape to it again! I'm so happy with the progress I've made over the past week.    Pertinent History breast cancer diagnosed November 2019: triple negative with Ki of 75% She completed chemotherapy 10/18/2018. 11/14/2018: she underwent bilateral mastectomy with 0/13 nodes positive on the right; radiation ended on 02/10/2019    Patient Stated Goals to not get lymphedema    Currently in Pain? Yes    Pain Score 4     Pain Location Hip    Pain Orientation Left    Pain Descriptors / Indicators Sore    Pain Type Chronic pain    Pain Onset More than a month ago    Pain Frequency Intermittent    Aggravating Factors  just always hurt    Pain Relieving Factors stretching                             [facility] Adult PT Treatment/Exercise - 12/30/19 0001  Manual Therapy   Myofascial Release to anterior chest and axilla, especially at area of cording palpable today which continues to improved by end of session    Manual Lymphatic Drainage (MLD) in supine for short neck and superficial and deep abdominals, right inguinal nodes and right axillo-inguinal anastamosis, left axillary nodes and anterior interaxillary anastamosis; then focused on Rt UE working from lateral upper arm, medial to lateral, and lateral again, then ant/post forearm and focusing here when retracing steps, then dorsum of hand retracing all steps reviewing technique with pt throughout having her return demo                    PT Short Term Goals - 03/31/19 1246      PT SHORT TERM GOAL #1   Title independent with initial HEP for left hip     Time 4    Period Weeks    Status Achieved      PT SHORT TERM GOAL #2   Title understand how to take care of vaginal health to reduce dryness and keep tissue mobility    Period Weeks    Status Achieved             PT Long Term Goals - 12/09/19 1942      PT LONG TERM GOAL #1   Title Pt will have reduction in L-Dex score to < 10    Baseline 14.5 on 12/09/2019    Time 4    Period Weeks    Status New      PT LONG TERM GOAL #2   Title Pt will report she is confident in her independence to use self MLD and use of compression to manage her lymphedema    Time 4    Period Weeks    Status New      PT LONG TERM GOAL #3   Title Pt will report she has retuned to gradually progressive resistive exercise for upper body to decrease lymphedema risk   Pt has stopped exercising to help her parents more   Time 4    Period Weeks    Status New                 Plan - 12/30/19 1032    Clinical Impression Statement Continued with focus on MLD to Rt UE, especially at forearm, though this is much improved today from last week. Contours of her forearm are similar again as pt has been consistent with wearing her compression sleeve and self MFR. Also continued with MFR to cording in Rt axilla and at lateral trunk where pt reports feeling tightness. She picks up her new flat knit garment after todays session. Pt is overall showing good progress with reduction of flare up of lymphedema symptoms.    Personal Factors and Comorbidities Other    Comorbidities chemo and radiation with cording and fascial tightness after    Examination-Activity Limitations Bathing;Reach Overhead;Caring for Others;Lift;Hygiene/Grooming;Locomotion Level;Sleep    Stability/Clinical Decision Making Evolving/Moderate complexity    Rehab Potential Good    PT Frequency 2x / week    PT Duration 4 weeks    PT Treatment/Interventions ADLs/Self Care Home Management;Therapeutic exercise;Manual techniques;Therapeutic  activities;Neuromuscular re-education;Patient/family education;Passive range of motion;Manual lymph drainage;Taping;Compression bandaging;Orthotic Fit/Training    PT Next Visit Plan Continue MLD to Rt UE and MFR to Rt axilla and lateral trunk. Assess fit of new flat knit at next session.    Consulted and Agree with Plan of Care Patient  Patient will benefit from skilled therapeutic intervention in order to improve the following deficits and impairments:  Decreased knowledge of precautions, Decreased knowledge of use of DME, Postural dysfunction, Decreased range of motion, Increased fascial restricitons, Impaired UE functional use, Increased muscle spasms, Impaired perceived functional ability, Pain, Decreased scar mobility, Decreased strength, Increased edema, Difficulty walking, Decreased activity tolerance, Decreased mobility  Visit Diagnosis: Postmastectomy lymphedema  Abnormal posture  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 History of right breast cancer 11/14/2018  . Chemotherapy induced neutropenia ([place]) 09/13/2018  . Port-A-Cath in place 06/10/2018  . Malignant neoplasm of upper-outer quadrant of right breast in female, estrogen receptor negative ([place]) 05/09/2018  . Low iron stores 01/16/2018  . Muscle spasm 01/15/2018  . Piriformis syndrome of left side 01/15/2018  . Grade I hemorrhoids 02/23/2017  . Low lying posterior placenta, antepartum 02/01/2016  . AMA (advanced maternal age) multigravida 35+ 11/22/2015  . 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 Calcification of right breast 02/19/2015  . Ligament tear 07/05/2012    Otelia Limes, PTA 12/30/2019, 10:37 AM  [place], [place], [address] Phone: [phone]    Fax:  [phone]  Name: Darlene Grant MRN: [id number] Date of Birth: 1975-07-04

## 2020-01-08 ENCOUNTER — Ambulatory Visit: Payer: BC Managed Care – PPO

## 2020-01-08 ENCOUNTER — Other Ambulatory Visit: Payer: Self-pay

## 2020-01-08 DIAGNOSIS — L599 Disorder of the skin and subcutaneous tissue related to radiation, unspecified: Secondary | ICD-10-CM | POA: Diagnosis not present

## 2020-01-08 DIAGNOSIS — I972 Postmastectomy lymphedema syndrome: Secondary | ICD-10-CM

## 2020-01-08 DIAGNOSIS — Z483 Aftercare following surgery for neoplasm: Secondary | ICD-10-CM | POA: Diagnosis not present

## 2020-01-08 DIAGNOSIS — R293 Abnormal posture: Secondary | ICD-10-CM | POA: Diagnosis not present

## 2020-01-08 NOTE — Therapy (Signed)
[place], [place], [address] Phone: [phone]   Fax:  [phone]  Physical Therapy Treatment  Patient Details  Name: Darlene Grant MRN: [id number] Date of Birth: 01-14-1976 Referring Provider (PT): Dr. Nicholas Lose; Dr. Clovia Cuff   Encounter Date: 01/08/2020   PT End of Session - 01/08/20 1104    Visit Number 8    Number of Visits 9    Date for PT Re-Evaluation 01/08/20    Authorization Type BCBS    PT Start Time 1006    PT Stop Time 1102    PT Time Calculation (min) 56 min    Activity Tolerance Patient tolerated treatment well    Behavior During Therapy [facility] for tasks assessed/performed           Past Medical History:  Diagnosis Date  . Abnormal Pap smear    ASCUS 03/13/11  . Anemia   . Breast pain, left   . Cancer ([place])   . Candida vaginitis   . Complication of anesthesia    pt states that all narcotics make her angry and she would prefer to avoid them.  . Rectal itching   . Vaginal Pap smear, abnormal     Past Surgical History:  Procedure Laterality Date  . BREAST BIOPSY Right 8/16  . CESAREAN SECTION N/A 11/21/2013   Procedure: CESAREAN SECTION;  Surgeon: Alwyn Pea, MD;  Location: [facility];  Service: Obstetrics;  Laterality: N/A;  . CESAREAN SECTION N/A 06/23/2016   Procedure: CESAREAN SECTION;  Surgeon: Emily Filbert, MD;  Location: [place];  Service: Obstetrics;  Laterality: N/A;  . HAND SURGERY  04/2012   ligament repair  . MASTECTOMY WITH AXILLARY LYMPH NODE DISSECTION Bilateral 11/14/2018   Procedure: RIGHT MASTECTOMY WITH RIGHT AXILLARY LYMPH NODE DISSECTION AND LEFT RISK REDUCING MASTECTOMY;  Surgeon: Rolm Bookbinder, MD;  Location: [place];  Service: General;  Laterality: Bilateral;  LATE ENTRY: Corrected laterality documentation to read right axillary lymph node dissection vs. documented laterality of left.    Marland Kitchen PILONIDAL CYST  EXCISION  2000  . PORTACATH PLACEMENT N/A 05/28/2018   Procedure: INSERTION PORT-A-CATH WITH ULTRASOUND;  Surgeon: Rolm Bookbinder, MD;  Location: [place];  Service: General;  Laterality: N/A;  . WISDOM TOOTH EXTRACTION  AGE 44    There were no vitals filed for this visit.   Subjective Assessment - 01/08/20 1010    Subjective My flat knit didn't make it off the [employer] truck so I should get it today. I also ordered a custom nighttime [employer] garment. I had to transfer my dad twice yesterday but I wore my Juxtafit yesterday and did my MLD last night and felt that helped and my arm looks good today. I do need review for MFR for my trunk.    Pertinent History breast cancer diagnosed November 2019: triple negative with Ki of 75% She completed chemotherapy 10/18/2018. 11/14/2018: she underwent bilateral mastectomy with 0/13 nodes positive on the right; radiation ended on 02/10/2019    Patient Stated Goals to not get lymphedema    Currently in Pain? Yes    Pain Score 3     Pain Location Hip    Pain Orientation Left    Pain Descriptors / Indicators Sore    Pain Type Chronic pain    Pain Onset More than a month ago    Pain Frequency Intermittent    Aggravating Factors  just always hurts  Pain Relieving Factors stretching                             [facility] Adult PT Treatment/Exercise - 01/08/20 0001      Manual Therapy   Soft tissue mobilization With coconut oil to Rt lateral trunk inferior to axilla where fascial and muscular tightness palpable today, Rt UE in passive flexion stretch throughout with rest prn    Myofascial Release to anterior chest and axilla    Manual Lymphatic Drainage (MLD) in supine for short neck and superficial and deep abdominals, right inguinal nodes and right axillo-inguinal anastamosis, left axillary nodes and anterior interaxillary anastamosis; then focused on Rt UE working from lateral upper arm, medial to lateral, and lateral again,  then ant/post forearm and focusing here when retracing steps, then dorsum of hand retracing all steps reviewing technique with pt throughout having her return demo                    PT Short Term Goals - 03/31/19 1246      PT SHORT TERM GOAL #1   Title independent with initial HEP for left hip    Time 4    Period Weeks    Status Achieved      PT SHORT TERM GOAL #2   Title understand how to take care of vaginal health to reduce dryness and keep tissue mobility    Period Weeks    Status Achieved             PT Long Term Goals - 12/09/19 1942      PT LONG TERM GOAL #1   Title Pt will have reduction in L-Dex score to < 10    Baseline 14.5 on 12/09/2019    Time 4    Period Weeks    Status New      PT LONG TERM GOAL #2   Title Pt will report she is confident in her independence to use self MLD and use of compression to manage her lymphedema    Time 4    Period Weeks    Status New      PT LONG TERM GOAL #3   Title Pt will report she has retuned to gradually progressive resistive exercise for upper body to decrease lymphedema risk   Pt has stopped exercising to help her parents more   Time 4    Period Weeks    Status New                 Plan - 01/08/20 1259    Clinical Impression Statement Continued with focus on manual lymph drainage to Rt UE, instructing pt in superfical and deep abdominals per her request, along with MFR at Rt axilla and lateral trunk and anterior chest. Her forearm continues to feel soft as pt has been doing well with managing her lymphedema sympotms thus far. She should receive her new flat knit garments today and has ordered a custom nighttime [employer] garemt. Educated pt during on ways she could continue this on herself at home, answering her questions throughout.    Personal Factors and Comorbidities Other    Comorbidities chemo and radiation with cording and fascial tightness after    Examination-Activity Limitations Bathing;Reach  Overhead;Caring for Others;Lift;Hygiene/Grooming;Locomotion Level;Sleep    Stability/Clinical Decision Making Evolving/Moderate complexity    Rehab Potential Good    PT Frequency 2x / week    PT Duration 4  weeks    PT Treatment/Interventions ADLs/Self Care Home Management;Therapeutic exercise;Manual techniques;Therapeutic activities;Neuromuscular re-education;Patient/family education;Passive range of motion;Manual lymph drainage;Taping;Compression bandaging;Orthotic Fit/Training    PT Next Visit Plan Renewal needed next session, change to 1x/wk? Continue MLD to Rt UE and MFR to Rt axilla and lateral trunk. Assess fit of new flat knit at next session. Pt plans to decr freq to 1x/wk as she is progressing well.    Consulted and Agree with Plan of Care Patient           Patient will benefit from skilled therapeutic intervention in order to improve the following deficits and impairments:  Decreased knowledge of precautions, Decreased knowledge of use of DME, Postural dysfunction, Decreased range of motion, Increased fascial restricitons, Impaired UE functional use, Increased muscle spasms, Impaired perceived functional ability, Pain, Decreased scar mobility, Decreased strength, Increased edema, Difficulty walking, Decreased activity tolerance, Decreased mobility  Visit Diagnosis: Postmastectomy lymphedema  Abnormal posture  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 History of right breast cancer 11/14/2018  . Chemotherapy induced neutropenia ([place]) 09/13/2018  . Port-A-Cath in place 06/10/2018  . Malignant neoplasm of upper-outer quadrant of right breast in female, estrogen receptor negative ([place]) 05/09/2018  . Low iron stores 01/16/2018  . Muscle spasm 01/15/2018  . Piriformis syndrome of left side 01/15/2018  . Grade I hemorrhoids  02/23/2017  . Low lying posterior placenta, antepartum 02/01/2016  . AMA (advanced maternal age) multigravida 35+ 11/22/2015  . 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 Calcification of right breast 02/19/2015  . Ligament tear 07/05/2012    Otelia Limes, PTA 01/08/2020, 1:07 PM  [place], [place], [address] Phone: [phone]   Fax:  [phone]  Name: Darlene Grant MRN: [id number] Date of Birth: 11/11/1975

## 2020-01-12 ENCOUNTER — Encounter: Payer: BC Managed Care – PPO | Admitting: Physical Therapy

## 2020-01-13 ENCOUNTER — Other Ambulatory Visit: Payer: Self-pay

## 2020-01-13 ENCOUNTER — Ambulatory Visit: Payer: BC Managed Care – PPO

## 2020-01-13 DIAGNOSIS — Z483 Aftercare following surgery for neoplasm: Secondary | ICD-10-CM

## 2020-01-13 DIAGNOSIS — R293 Abnormal posture: Secondary | ICD-10-CM

## 2020-01-13 DIAGNOSIS — L599 Disorder of the skin and subcutaneous tissue related to radiation, unspecified: Secondary | ICD-10-CM | POA: Diagnosis not present

## 2020-01-13 DIAGNOSIS — I972 Postmastectomy lymphedema syndrome: Secondary | ICD-10-CM

## 2020-01-13 NOTE — Therapy (Signed)
[place], [place], [address] Phone: [phone]   Fax:  [phone]  Physical Therapy Treatment  Patient Details  Name: Darlene Grant MRN: [id number] Date of Birth: 07-04-1975 Referring Provider (PT): Dr. Nicholas Lose; Dr. Clovia Cuff   Encounter Date: 01/13/2020   PT End of Session - 01/13/20 1001    Visit Number 9    Number of Visits 15    Date for PT Re-Evaluation 02/24/20    Authorization Type BCBS    PT Start Time 0908    PT Stop Time 1009    PT Time Calculation (min) 61 min    Activity Tolerance Patient tolerated treatment well    Behavior During Therapy [facility] for tasks assessed/performed           Past Medical History:  Diagnosis Date  . Abnormal Pap smear    ASCUS 03/13/11  . Anemia   . Breast pain, left   . Cancer ([place])   . Candida vaginitis   . Complication of anesthesia    pt states that all narcotics make her angry and she would prefer to avoid them.  . Rectal itching   . Vaginal Pap smear, abnormal     Past Surgical History:  Procedure Laterality Date  . BREAST BIOPSY Right 8/16  . CESAREAN SECTION N/A 11/21/2013   Procedure: CESAREAN SECTION;  Surgeon: Alwyn Pea, MD;  Location: [facility];  Service: Obstetrics;  Laterality: N/A;  . CESAREAN SECTION N/A 06/23/2016   Procedure: CESAREAN SECTION;  Surgeon: Emily Filbert, MD;  Location: [place];  Service: Obstetrics;  Laterality: N/A;  . HAND SURGERY  04/2012   ligament repair  . MASTECTOMY WITH AXILLARY LYMPH NODE DISSECTION Bilateral 11/14/2018   Procedure: RIGHT MASTECTOMY WITH RIGHT AXILLARY LYMPH NODE DISSECTION AND LEFT RISK REDUCING MASTECTOMY;  Surgeon: Rolm Bookbinder, MD;  Location: [place];  Service: General;  Laterality: Bilateral;  LATE ENTRY: Corrected laterality documentation to read right axillary lymph node dissection vs. documented laterality of left.    Marland Kitchen PILONIDAL CYST  EXCISION  2000  . PORTACATH PLACEMENT N/A 05/28/2018   Procedure: INSERTION PORT-A-CATH WITH ULTRASOUND;  Surgeon: Rolm Bookbinder, MD;  Location: [place];  Service: General;  Laterality: N/A;  . WISDOM TOOTH EXTRACTION  AGE 44    There were no vitals filed for this visit.   Subjective Assessment - 01/13/20 0917    Subjective My new flat knit arrived but it was too short so we are reordering, but I brought it for you to also assess.    Pertinent History breast cancer diagnosed November 2019: triple negative with Ki of 75% She completed chemotherapy 10/18/2018. 11/14/2018: she underwent bilateral mastectomy with 0/13 nodes positive on the right; radiation ended on 02/10/2019    Patient Stated Goals to not get lymphedema    Currently in Pain? Yes    Pain Score 2     Pain Location Hip    Pain Orientation Left    Pain Descriptors / Indicators Sore    Pain Type Chronic pain    Pain Onset More than a month ago    Pain Frequency Intermittent    Aggravating Factors  just always hurts    Pain Relieving Factors stretching                             [facility] Adult PT Treatment/Exercise - 01/13/20 0001  Manual Therapy   Myofascial Release to anterior chest and axilla    Manual Lymphatic Drainage (MLD) in supine for short neck and superficial and deep abdominals, right inguinal nodes and right axillo-inguinal anastamosis, left axillary nodes and anterior interaxillary anastamosis; then focused on Rt UE working from lateral upper arm, medial to lateral, and lateral again, then ant/post forearm and focusing here when retracing steps, then dorsum of hand retracing all steps reviewing technique with pt throughout having her return demo                    PT Short Term Goals - 03/31/19 1246      PT SHORT TERM GOAL #1   Title independent with initial HEP for left hip    Time 4    Period Weeks    Status Achieved      PT SHORT TERM GOAL #2   Title  understand how to take care of vaginal health to reduce dryness and keep tissue mobility    Period Weeks    Status Achieved             PT Long Term Goals - 01/13/20 0922      PT LONG TERM GOAL #1   Title Pt will have reduction in L-Dex score to < 10    Baseline 14.5 on 12/09/2019; unable to take today as couldn't get L-Dex to pair with bluetooth, will retake at next session, however pt does have less fibrosis in forearm-01/13/20    Status On-going      PT LONG TERM GOAL #2   Title Pt will report she is confident in her independence to use self MLD and use of compression to manage her lymphedema    Baseline Pt is feeling more confident with use of compression and is working towards confidence with self MLD (confident with UE sequence, wants to be independent with abdominal sequences as well)-01/13/20    Status Partially Met      PT LONG TERM GOAL #3   Title Pt will report she has returned to gradually progressive resistive exercise for upper body to decrease lymphedema risk    Baseline Pt reports though is working towards thi slowly, she does feel confident about how to do this-01/13/20    Status Achieved                 Plan - 01/13/20 1409    Clinical Impression Statement Pt brought her new flat knit compression sleeve and it is too short. Also gauntlet seemed a little loose and short at dorsum of hand. Pt was going to [facility] after appt this morning to pick up new nighttime garment and was going to have them reassess measurements for reordering new flat knit sleeve and gauntlet. Continued with MFR to Rt chest wall with varying hand placements, along with continuing MLD of Rt UE. Attempted retaking L-Dex but could not get bluetooth to pair today and ran out of time at end of session so will do this at next session. Renewal done today reducing freq to 1x/wk for up to 6 weeks but pt will only be in town for about 3 of those weeks. She will benefit from continued therapy at  this time to assure correct fitting garments arrive and to finalize any HEP.    Personal Factors and Comorbidities Other    Comorbidities chemo and radiation with cording and fascial tightness after    Examination-Activity Limitations Bathing;Reach Overhead;Caring for Others;Lift;Hygiene/Grooming;Locomotion Level;Sleep      Stability/Clinical Decision Making Evolving/Moderate complexity    Rehab Potential Good    PT Frequency 2x / week    PT Duration 4 weeks    PT Treatment/Interventions ADLs/Self Care Home Management;Therapeutic exercise;Manual techniques;Therapeutic activities;Neuromuscular re-education;Patient/family education;Passive range of motion;Manual lymph drainage;Taping;Compression bandaging;Orthotic Fit/Training    PT Next Visit Plan Renewal done today; assess new flat knit garments if they have arrived; review self MLD assessing technique including superficial and deep abdominals, and finalize HEP; cont MLD to Rt UE and MFR to to Rt axilla and lateral trunk    Consulted and Agree with Plan of Care Patient           Patient will benefit from skilled therapeutic intervention in order to improve the following deficits and impairments:  Decreased knowledge of precautions, Decreased knowledge of use of DME, Postural dysfunction, Decreased range of motion, Increased fascial restricitons, Impaired UE functional use, Increased muscle spasms, Impaired perceived functional ability, Pain, Decreased scar mobility, Decreased strength, Increased edema, Difficulty walking, Decreased activity tolerance, Decreased mobility  Visit Diagnosis: Postmastectomy lymphedema  Abnormal posture  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 History of right breast cancer 11/14/2018  . Chemotherapy induced neutropenia ([place]) 09/13/2018  . Port-A-Cath in  place 06/10/2018  . Malignant neoplasm of upper-outer quadrant of right breast in female, estrogen receptor negative ([place]) 05/09/2018  . Low iron stores 01/16/2018  . Muscle spasm 01/15/2018  . Piriformis syndrome of left side 01/15/2018  . Grade I hemorrhoids 02/23/2017  . Low lying posterior placenta, antepartum 02/01/2016  . AMA (advanced maternal age) multigravida 35+ 11/22/2015  . 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 Calcification of right breast 02/19/2015  . Ligament tear 07/05/2012    Otelia Limes, PTA 01/13/2020, 2:19 PM  [place], [place], [address] Phone: [phone]   Fax:  [phone]  Name: Darlene Grant MRN: [id number] Date of Birth: 1976/03/02

## 2020-01-13 NOTE — Addendum Note (Signed)
Addended by: Stark Bray on: 01/13/2020 02:26 PM   Modules accepted: Orders

## 2020-01-14 ENCOUNTER — Telehealth: Payer: Self-pay

## 2020-01-14 ENCOUNTER — Ambulatory Visit (INDEPENDENT_AMBULATORY_CARE_PROVIDER_SITE_OTHER): Payer: BC Managed Care – PPO | Admitting: Family Medicine

## 2020-01-14 ENCOUNTER — Encounter: Payer: Self-pay | Admitting: Family Medicine

## 2020-01-14 VITALS — BP 110/74 | HR 73 | Temp 98.5°F | Resp 16 | Ht 64.0 in | Wt 140.1 lb

## 2020-01-14 DIAGNOSIS — Z171 Estrogen receptor negative status [ER-]: Secondary | ICD-10-CM | POA: Diagnosis not present

## 2020-01-14 DIAGNOSIS — C50411 Malignant neoplasm of upper-outer quadrant of right female breast: Secondary | ICD-10-CM

## 2020-01-14 DIAGNOSIS — Z0000 Encounter for general adult medical examination without abnormal findings: Secondary | ICD-10-CM

## 2020-01-14 DIAGNOSIS — R5383 Other fatigue: Secondary | ICD-10-CM | POA: Diagnosis not present

## 2020-01-14 DIAGNOSIS — R590 Localized enlarged lymph nodes: Secondary | ICD-10-CM

## 2020-01-14 DIAGNOSIS — Z923 Personal history of irradiation: Secondary | ICD-10-CM | POA: Diagnosis not present

## 2020-01-14 DIAGNOSIS — E611 Iron deficiency: Secondary | ICD-10-CM

## 2020-01-14 DIAGNOSIS — D701 Agranulocytosis secondary to cancer chemotherapy: Secondary | ICD-10-CM

## 2020-01-14 DIAGNOSIS — Z0001 Encounter for general adult medical examination with abnormal findings: Secondary | ICD-10-CM

## 2020-01-14 DIAGNOSIS — Z13220 Encounter for screening for lipoid disorders: Secondary | ICD-10-CM

## 2020-01-14 DIAGNOSIS — T451X5A Adverse effect of antineoplastic and immunosuppressive drugs, initial encounter: Secondary | ICD-10-CM

## 2020-01-14 DIAGNOSIS — R600 Localized edema: Secondary | ICD-10-CM

## 2020-01-14 DIAGNOSIS — Z131 Encounter for screening for diabetes mellitus: Secondary | ICD-10-CM

## 2020-01-14 LAB — COMPREHENSIVE METABOLIC PANEL
ALT: 9 U/L (ref 0–35)
AST: 11 U/L (ref 0–37)
Albumin: 4.7 g/dL (ref 3.5–5.2)
Alkaline Phosphatase: 57 U/L (ref 39–117)
BUN: 15 mg/dL (ref 6–23)
CO2: 32 mEq/L (ref 19–32)
Calcium: 10 mg/dL (ref 8.4–10.5)
Chloride: 102 mEq/L (ref 96–112)
Creatinine, Ser: 0.75 mg/dL (ref 0.40–1.20)
GFR: 84.7 mL/min (ref >60.00)
Glucose, Bld: 99 mg/dL (ref 70–99)
Potassium: 4.9 mEq/L (ref 3.5–5.1)
Sodium: 139 mEq/L (ref 135–145)
Total Bilirubin: 1.1 mg/dL (ref 0.2–1.2)
Total Protein: 6.9 g/dL (ref 6.0–8.3)

## 2020-01-14 LAB — IRON: Iron: 140 ug/dL (ref 42–145)

## 2020-01-14 LAB — CBC WITH DIFFERENTIAL/PLATELET
Basophils Absolute: 0 10*3/uL (ref 0.0–0.1)
Basophils Relative: 0.3 % (ref 0.0–3.0)
Eosinophils Absolute: 0 10*3/uL (ref 0.0–0.7)
Eosinophils Relative: 0.8 % (ref 0.0–5.0)
HCT: 36.1 % (ref 36.0–46.0)
Hemoglobin: 12.5 g/dL (ref 12.0–15.0)
Lymphocytes Relative: 27.7 % (ref 12.0–46.0)
Lymphs Abs: 1 10*3/uL (ref 0.7–4.0)
MCHC: 34.5 g/dL (ref 30.0–36.0)
MCV: 96.9 fl (ref 78.0–100.0)
Monocytes Absolute: 0.3 10*3/uL (ref 0.1–1.0)
Monocytes Relative: 7.8 % (ref 3.0–12.0)
Neutro Abs: 2.3 10*3/uL (ref 1.4–7.7)
Neutrophils Relative %: 63.4 % (ref 43.0–77.0)
Platelets: 204 10*3/uL (ref 150.0–400.0)
RBC: 3.72 Mil/uL — ABNORMAL LOW (ref 3.87–5.11)
RDW: 13.2 % (ref 11.5–15.5)
WBC: 3.6 10*3/uL — ABNORMAL LOW (ref 4.0–10.5)

## 2020-01-14 LAB — LIPID PANEL
Cholesterol: 168 mg/dL (ref 0–200)
HDL: 66.7 mg/dL (ref >39.00)
LDL Cholesterol: 79 mg/dL (ref 0–99)
NonHDL: 101.34
Total CHOL/HDL Ratio: 3
Triglycerides: 112 mg/dL (ref 0.0–149.0)
VLDL: 22.4 mg/dL (ref 0.0–40.0)

## 2020-01-14 LAB — FERRITIN: Ferritin: 33.4 ng/mL (ref 10.0–291.0)

## 2020-01-14 LAB — HEMOGLOBIN A1C: Hgb A1c MFr Bld: 4.9 % (ref 4.6–6.5)

## 2020-01-14 LAB — TSH: TSH: 0.41 u[IU]/mL (ref 0.35–4.50)

## 2020-01-14 NOTE — Patient Instructions (Signed)
Health Maintenance, Female Adopting a healthy lifestyle and getting preventive care are important in promoting health and wellness. Ask your health care provider about:  The right schedule for you to have regular tests and exams.  Things you can do on your own to prevent diseases and keep yourself healthy. What should I know about diet, weight, and exercise? Eat a healthy diet   Eat a diet that includes plenty of vegetables, fruits, low-fat dairy products, and lean protein.  Do not eat a lot of foods that are high in solid fats, added sugars, or sodium. Maintain a healthy weight Body mass index (BMI) is used to identify weight problems. It estimates body fat based on height and weight. Your health care provider can help determine your BMI and help you achieve or maintain a healthy weight. Get regular exercise Get regular exercise. This is one of the most important things you can do for your health. Most adults should:  Exercise for at least 150 minutes each week. The exercise should increase your heart rate and make you sweat (moderate-intensity exercise).  Do strengthening exercises at least twice a week. This is in addition to the moderate-intensity exercise.  Spend less time sitting. Even light physical activity can be beneficial. Watch cholesterol and blood lipids Have your blood tested for lipids and cholesterol at 42 years of age, then have this test every 5 years. Have your cholesterol levels checked more often if:  Your lipid or cholesterol levels are high.  You are older than 42 years of age.  You are at high risk for heart disease. What should I know about cancer screening? Depending on your health history and family history, you may need to have cancer screening at various ages. This may include screening for:  Breast cancer.  Cervical cancer.  Colorectal cancer.  Skin cancer.  Lung cancer. What should I know about heart disease, diabetes, and high blood  pressure? Blood pressure and heart disease  High blood pressure causes heart disease and increases the risk of stroke. This is more likely to develop in people who have high blood pressure readings, are of African descent, or are overweight.  Have your blood pressure checked: ? Every 3-5 years if you are 18-39 years of age. ? Every year if you are 40 years old or older. Diabetes Have regular diabetes screenings. This checks your fasting blood sugar level. Have the screening done:  Once every three years after age 40 if you are at a normal weight and have a low risk for diabetes.  More often and at a younger age if you are overweight or have a high risk for diabetes. What should I know about preventing infection? Hepatitis B If you have a higher risk for hepatitis B, you should be screened for this virus. Talk with your health care provider to find out if you are at risk for hepatitis B infection. Hepatitis C Testing is recommended for:  Everyone born from 1945 through 1965.  Anyone with known risk factors for hepatitis C. Sexually transmitted infections (STIs)  Get screened for STIs, including gonorrhea and chlamydia, if: ? You are sexually active and are younger than 42 years of age. ? You are older than 42 years of age and your health care provider tells you that you are at risk for this type of infection. ? Your sexual activity has changed since you were last screened, and you are at increased risk for chlamydia or gonorrhea. Ask your health care provider if   you are at risk.  Ask your health care provider about whether you are at high risk for HIV. Your health care provider may recommend a prescription medicine to help prevent HIV infection. If you choose to take medicine to prevent HIV, you should first get tested for HIV. You should then be tested every 3 months for as long as you are taking the medicine. Pregnancy  If you are about to stop having your period (premenopausal) and  you may become pregnant, seek counseling before you get pregnant.  Take 400 to 800 micrograms (mcg) of folic acid every day if you become pregnant.  Ask for birth control (contraception) if you want to prevent pregnancy. Osteoporosis and menopause Osteoporosis is a disease in which the bones lose minerals and strength with aging. This can result in bone fractures. If you are 65 years old or older, or if you are at risk for osteoporosis and fractures, ask your health care provider if you should:  Be screened for bone loss.  Take a calcium or vitamin D supplement to lower your risk of fractures.  Be given hormone replacement therapy (HRT) to treat symptoms of menopause. Follow these instructions at home: Lifestyle  Do not use any products that contain nicotine or tobacco, such as cigarettes, e-cigarettes, and chewing tobacco. If you need help quitting, ask your health care provider.  Do not use street drugs.  Do not share needles.  Ask your health care provider for help if you need support or information about quitting drugs. Alcohol use  Do not drink alcohol if: ? Your health care provider tells you not to drink. ? You are pregnant, may be pregnant, or are planning to become pregnant.  If you drink alcohol: ? Limit how much you use to 0-1 drink a day. ? Limit intake if you are breastfeeding.  Be aware of how much alcohol is in your drink. In the [place] one drink equals one 12 oz bottle of beer (355 mL), one 5 oz glass of wine (148 mL), or one 1 oz glass of hard liquor (44 mL). General instructions  Schedule regular health, dental, and eye exams.  Stay current with your vaccines.  Tell your health care provider if: ? You often feel depressed. ? You have ever been abused or do not feel safe at home. Summary  Adopting a healthy lifestyle and getting preventive care are important in promoting health and wellness.  Follow your health care provider's instructions about healthy  diet, exercising, and getting tested or screened for diseases.  Follow your health care provider's instructions on monitoring your cholesterol and blood pressure. This information is not intended to replace advice given to you by your health care provider. Make sure you discuss any questions you have with your health care provider. Document Revised: 05/29/2018 Document Reviewed: 05/29/2018 Elsevier Patient Education  2020 [employer]  

## 2020-01-14 NOTE — Progress Notes (Signed)
This visit occurred during the SARS-CoV-2 public health emergency.  Safety protocols were in place, including screening questions prior to the visit, additional usage of staff PPE, and extensive cleaning of exam room while observing appropriate contact time as indicated for disinfecting solutions.    Patient ID: Darlene Grant, female  DOB: 01-29-1976, 44 y.o.   MRN: [id number] Patient Care Team    Relationship Specialty Notifications Start End  Ma Hillock, DO PCP - General Family Medicine  01/14/20   Rolm Bookbinder, MD Consulting Physician General Surgery  05/10/18   Magrinat, Virgie Dad, MD Consulting Physician Oncology  05/10/18   Gery Pray, MD Consulting Physician Radiation Oncology  05/10/18   Emily Filbert, MD Consulting Physician Obstetrics and Gynecology  05/15/18   Lavonna Monarch, MD Consulting Physician Dermatology  05/15/18   Mauro Kaufmann, RN Oncology Nurse Navigator   10/04/18   Rockwell Germany, RN Oncology Nurse Navigator   10/04/18   Renelda Loma, OD  Optometry  01/15/20     Chief Complaint  Patient presents with  . Establish Care    Prior PCP was [facility] in [place].  Pt would like CPE at todays visit. Pap 01/27/2019 Dr Hulan Fray. Mammo- hx of breast cancer- US done 04/2019.     Subjective:  Darlene Grant is a 44 y.o.  Female  present for EST/CPE . All past medical history, surgical history, allergies, family history, immunizations, medications and social history were updated in the electronic medical record today. All recent labs, ED visits and hospitalizations within the last year were reviewed. 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 She has struggled with her iron levels and chemotherapy-induced neutropenia. She follows routinely every 3 months with her oncology team. Patient has had a  bilateral mastectomy with right axillary node dissection. 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 She does work with PT/OT routinely.  Health maintenance:  Colonoscopy: screen at 79 Mammogram: completed: h/o breast cancer w right mastectomy.- follows with ONC and GYN.04/2019. Cervical cancer screening: last pap: 01/2019, 3-5 yr follow up, completed by: Dr. Illene Labrador Immunizations: tdap UTD 2016, Influenza UTD 2020 (encouraged yearly), COVID vaccines completed (pfizer) Infectious disease screening: HIV completed. Hep C declined DEXA: routine screen. Assistive device: none Oxygen LPF:XTKW Patient has a Dental home. Hospitalizations/ED visits: reviewed  Depression screen [facility] 2/9 01/14/2020  Decreased Interest 0  Down, Depressed, Hopeless 0  PHQ - 2 Score 0  Some recent data might be hidden   GAD 7 : Generalized Anxiety Score 01/15/2018  Nervous, Anxious, on Edge 0  Control/stop worrying 0  Worry too much - different things 0  Trouble relaxing 0  Restless 0  Easily annoyed or irritable 0  Afraid - awful might happen 0  Total GAD 7 Score 0  Anxiety Difficulty Not difficult at all   Immunization History  Administered Date(s) Administered  . Hepatitis A 01/13/2010, 08/10/2010  . Influenza,inj,Quad PF,6+ Mos 02/24/2015, 06/25/2016, 02/21/2017  . PPD Test 12/15/2011, 12/09/2012, 05/17/2015  . Tdap 02/24/2015   Past Medical History:  Diagnosis Date  . Abnormal Pap smear    ASCUS 03/13/11  . Anemia   . Breast cancer ([place])   . Complication of anesthesia    pt states that all narcotics make her angry and she would prefer to avoid them.  . Grade  I hemorrhoids 02/23/2017  . Piriformis syndrome of left side 01/15/2018  . Vaginal Pap smear, abnormal    Allergies  Allergen Reactions  . Augmentin [Amoxicillin-Pot Clavulanate]     diarrhea  . Hydrocodone Itching  . Keflex [Cephalexin] Diarrhea   Past Surgical History:    Procedure Laterality Date  . BREAST BIOPSY Right 8/16  . CESAREAN SECTION N/A 11/21/2013   Procedure: CESAREAN SECTION;  Surgeon: Alwyn Pea, MD;  Location: [facility];  Service: Obstetrics;  Laterality: N/A;  . CESAREAN SECTION N/A 06/23/2016   Procedure: CESAREAN SECTION;  Surgeon: Emily Filbert, MD;  Location: [place];  Service: Obstetrics;  Laterality: N/A;  . HAND SURGERY  04/2012   ligament repair  . MASTECTOMY WITH AXILLARY LYMPH NODE DISSECTION Bilateral 11/14/2018   Procedure: RIGHT MASTECTOMY WITH RIGHT AXILLARY LYMPH NODE DISSECTION AND LEFT RISK REDUCING MASTECTOMY;  Surgeon: Rolm Bookbinder, MD;  Location: [place];  Service: General;  Laterality: Bilateral;  LATE ENTRY: Corrected laterality documentation to read right axillary lymph node dissection vs. documented laterality of left.    Marland Kitchen PILONIDAL CYST EXCISION  2000  . PORTACATH PLACEMENT N/A 05/28/2018   Procedure: INSERTION PORT-A-CATH WITH ULTRASOUND;  Surgeon: Rolm Bookbinder, MD;  Location: [facility];  Service: General;  Laterality: N/A;  . WISDOM TOOTH EXTRACTION  AGE 64   Family History  Problem Relation Age of Onset  . Breast cancer Mother 97  . Hypertension Father   . Stomach cancer Maternal Grandmother   . Skin cancer Maternal Grandmother   . Depression Maternal Grandmother   . Lung cancer Maternal Grandfather        lung   Social History   Social History Narrative   Marital status/children/pets: Married. 2 children.   Education/employment: Masters degree. Employed as an Warden/ranger.   Safety:      -Wears a bicycle helmet riding a bike: Yes     -smoke alarm in the home:Yes     - wears seatbelt: Yes     - Feels safe in their relationships: Yes    Allergies as of 01/14/2020      Reactions   Augmentin [amoxicillin-pot Clavulanate]    diarrhea   Hydrocodone Itching   Keflex [cephalexin] Diarrhea      Medication List       Accurate as of January 14, 2020 11:59 PM. If you have any questions, ask your nurse or doctor.        STOP taking these medications   cyclobenzaprine 10 MG tablet Commonly known as: FLEXERIL Stopped by: Howard Pouch, DO     TAKE these medications   B-complex with vitamin C tablet Take 1 tablet by mouth daily as needed.   Calcium Acetate-Magnesium Carb 450-200 MG Tabs Take by mouth.   fluticasone 50 MCG/ACT nasal spray Commonly known as: FLONASE Place 2 sprays into both nostrils daily.   hydrocortisone 2.5 % cream Apply topically as needed.   loratadine 10 MG tablet Commonly known as: CLARITIN Take 10 mg by mouth daily as needed for allergies.   multivitamin with minerals tablet Take 1 tablet by mouth daily.   PROBIOTIC DAILY PO Take 1 tablet by mouth.   Turmeric 500 MG Caps Take 1 capsule by mouth daily.   Vitamin D 125 MCG (5000 UT) Caps Take 5,000 Units by mouth daily.       All past medical history, surgical history, allergies, family history, immunizations andmedications were updated in the EMR today  and reviewed under the history and medication portions of their EMR.      No results found.   ROS: 14 pt review of systems performed and negative (unless mentioned in an HPI)  Objective: BP 110/74 (BP Location: Left Arm, Patient Position: Sitting, Cuff Size: Normal)   Pulse 73   Temp 98.5 F (36.9 C) (Temporal)   Resp 16   Ht '5\' 4"'$  (1.626 m)   Wt 140 lb 2 oz (63.6 kg)   LMP 12/26/2019 (Exact Date)   SpO2 100%   Breastfeeding No   BMI 24.05 kg/m  Gen: Afebrile. No acute distress. Nontoxic in appearance, well-developed, well-nourished, very pleasant female HENT: AT. [place]. Bilateral TM visualized and normal in appearance, normal external auditory canal. MMM, no oral lesions, adequate dentition. Bilateral nares within normal limits. Throat without erythema, ulcerations or exudates. No cough on exam, no hoarseness on exam. Eyes:Pupils Equal Round Reactive to light, Extraocular  movements intact,  Conjunctiva without redness, discharge or icterus. Neck/lymp/endocrine: Supple, left submandibular lymphadenopathy present-2-3 enlarged lymph nodes palpated. Nontender. Hard but mobile., No thyromegaly CV: RRR no murmur, no lower extremity edema, +2/4 P posterior tibialis pulses. Chest: CTAB, no wheeze, rhonchi or crackles. Normal respiratory effort. Good air movement. Abd: Soft. Flat. NTND. BS present. No masses palpated. No hepatosplenomegaly. No rebound tenderness or guarding. Skin: No rashes, purpura or petechiae. Warm and well-perfused. Skin intact. Neuro/Msk:  Normal gait. PERLA. EOMi. Alert. Oriented x3.  Cranial nerves II through XII intact. Muscle strength 5/5 upper/lower extremity. DTRs equal bilaterally. Psych: Normal affect, dress and demeanor. Normal speech. Normal thought content and judgment.   No exam data present  Assessment/plan: Darlene Grant is a 44 y.o. female present for  Establishment of care Fatigue/Personal history of radiation exposure - TSH - Iron - Ferritin Chemotherapy induced neutropenia (HCC) - CBC w/Diff Lipid screening - Lipid panel Malignant neoplasm of upper-outer quadrant of right breast in female, estrogen receptor negative (HCC)/lymphedema Continue routine follow-ups with her oncology team and PT/OT for lymphedema. - Comp Met (CMET) - CBC w/Diff Diabetes mellitus screening - Hemoglobin A1c Iron deficiency - Iron - Ferritin LAD (lymphadenopathy), submandibular Left submandibular lymphadenopathy present. Patient states has been present for a little over a year. Nontender, hard, mobile lymphadenopathy in patient with a history of breast cancer. Will obtain soft tissue neck ultrasound to rule out malignancy concern. - US Soft Tissue Head/Neck (NON-THYROID); Future Encounter for preventative exam with abnormal findings: Patient was encouraged to exercise greater than 150 minutes a week. Patient was encouraged to  choose a diet filled with fresh fruits and vegetables, and lean meats. AVS provided to patient today for education/recommendation on gender specific health and safety maintenance. Colonoscopy: screen at 61 Mammogram: completed: h/o breast cancer w right mastectomy.- follows with ONC and GYN.04/2019. Cervical cancer screening: last pap: 01/2019, 3-5 yr follow up, completed by: Dr. Illene Labrador Immunizations: tdap UTD 2016, Influenza UTD 2020 (encouraged yearly), COVID vaccines completed (pfizer) Infectious disease screening: HIV completed. Hep C declined DEXA: routine screen.  No follow-ups on file.   Orders Placed This Encounter  Procedures  . US Soft Tissue Head/Neck (NON-THYROID)  . Hemoglobin A1c  . Lipid panel  . Comp Met (CMET)  . CBC w/Diff  . TSH  . Iron  . Ferritin   No orders of the defined types were placed in this encounter.  Referral Orders  No referral(s) requested today    New patient establishment new patient preventative visit with completed today, along with an  additional> 20 Minutes was dedicated to this patient's encounter to include pre-visit review of chart, face-to-face time with patient and post-visit work- which include documentation and prescribing medications and/or ordering test when necessary for patient's chronic conditions and new acute concerns.  Electronically signed by: Howard Pouch, DO [place]

## 2020-01-14 NOTE — Telephone Encounter (Signed)
CPE Insurance form was brought by pt on 01/14/2020 OV.  Placed on my desk to fill out when labs return.   **Copy will need to be made and placed for scan after paperwork is completed

## 2020-01-15 ENCOUNTER — Encounter: Payer: Self-pay | Admitting: Family Medicine

## 2020-01-15 DIAGNOSIS — R5383 Other fatigue: Secondary | ICD-10-CM | POA: Insufficient documentation

## 2020-01-15 DIAGNOSIS — R590 Localized enlarged lymph nodes: Secondary | ICD-10-CM | POA: Insufficient documentation

## 2020-01-15 DIAGNOSIS — R609 Edema, unspecified: Secondary | ICD-10-CM | POA: Insufficient documentation

## 2020-01-15 NOTE — Telephone Encounter (Signed)
Pt was called and given results. Copy made and placed for scan. Faxed to [facility]. Pt wanted original mailed, placed in envelope and mailed

## 2020-01-19 ENCOUNTER — Ambulatory Visit (INDEPENDENT_AMBULATORY_CARE_PROVIDER_SITE_OTHER): Payer: BC Managed Care – PPO

## 2020-01-19 ENCOUNTER — Other Ambulatory Visit: Payer: Self-pay

## 2020-01-19 DIAGNOSIS — Z853 Personal history of malignant neoplasm of breast: Secondary | ICD-10-CM | POA: Diagnosis not present

## 2020-01-19 DIAGNOSIS — R221 Localized swelling, mass and lump, neck: Secondary | ICD-10-CM | POA: Diagnosis not present

## 2020-01-19 DIAGNOSIS — C50411 Malignant neoplasm of upper-outer quadrant of right female breast: Secondary | ICD-10-CM

## 2020-01-19 DIAGNOSIS — Z171 Estrogen receptor negative status [ER-]: Secondary | ICD-10-CM

## 2020-01-19 DIAGNOSIS — R590 Localized enlarged lymph nodes: Secondary | ICD-10-CM

## 2020-01-20 ENCOUNTER — Encounter: Payer: Self-pay | Admitting: Family Medicine

## 2020-01-20 ENCOUNTER — Telehealth: Payer: Self-pay

## 2020-01-20 NOTE — Telephone Encounter (Signed)
Patient called in. She has been on cyclobenzaprine  for years and feels that she does not need an appointment to get an Rx. She is requesting a CMA to call her.

## 2020-01-20 NOTE — Telephone Encounter (Signed)
Returned call to patient and she stated that she told the nurse when she was here last week that there were two medications that she took as needed and they were Cyclobenzaprine 10 mg, 1 to 1/2 tablet three times daily as needed for muscle spasms and Hydrocortisone Cream 2.5% twice daily as needed. Patient stated she spoke with someone and was told to schedule an appointment to discuss with Dr. Raoul Pitch, but she doesn't think she need to schedule another appointment because she was just seen last week. Patient would like a refill of these meds. Please advise.

## 2020-01-20 NOTE — Telephone Encounter (Signed)
Patient was a new patient establishment visit and physical 01/14/2020. We did not cover any acute concerns in which Flexeril or hydrocortisone cream would have been discussed.  If desiring treatment for those conditions , she will need to be seen to evaluate for appropriateness- since I have never prescribed these to her before or seen her for those conditions.  Thanks

## 2020-01-21 NOTE — Telephone Encounter (Signed)
LM requesting call back.  

## 2020-01-22 NOTE — Telephone Encounter (Signed)
Patient spoke with Dorna Bloom, Practice Administrator.

## 2020-01-22 NOTE — Telephone Encounter (Signed)
Patient called, requesting to speak with you in regards to you calling her yesterday.

## 2020-01-22 NOTE — Telephone Encounter (Signed)
Spoke with radiology at [facility], [place] has not been read yet. Staff sending for expedited result.   Spoke to patient, explained [place] was not read yet and she may get result before PCP does. Advised she will receive phone call with result once PCP reviews. Patient states she did ask for specific radiologist to read/result [place] if he was available, therefore this may reason for delay.

## 2020-01-22 NOTE — Telephone Encounter (Signed)
Please inform patient the reason she has not been called with her neck ultrasound results is because radiology has not read her ultrasound as of yet.  When they release the results I will review and she will be called.  Furthermore, her results will be automatically be released in her MyChart - so she will likely see the results even before I have a chance to review them.  We still call regardless with results once received.   Can you reach out to you radiology and find out why the results have not been released.  I agree 3 days seems to be a long time to have those resulted.

## 2020-01-22 NOTE — Telephone Encounter (Signed)
I explained to patient that since she did not discuss any issues or concerns regarding these particular medications, she would need a follow up visit. Patient stated that that was "ridiculous since they are PRN meds" and that she would just ask another doctor for them.  Also stated she had an ultrasound on August 2 and "as a cancer survivor it was ridiculous that no one had called her with her results yet." Patient was advised that I would route a message to the clinic for a callback with her results and she requested that it be this morning since she's waited this long.. Patient was clearly upset at the need for an additional visit and the length of time she has waited for her ultrasound results. I explained that the provider had to review results before they could be called to the patient.

## 2020-01-27 DIAGNOSIS — L601 Onycholysis: Secondary | ICD-10-CM | POA: Diagnosis not present

## 2020-01-27 DIAGNOSIS — D225 Melanocytic nevi of trunk: Secondary | ICD-10-CM | POA: Diagnosis not present

## 2020-01-27 DIAGNOSIS — D2262 Melanocytic nevi of left upper limb, including shoulder: Secondary | ICD-10-CM | POA: Diagnosis not present

## 2020-01-27 DIAGNOSIS — D2261 Melanocytic nevi of right upper limb, including shoulder: Secondary | ICD-10-CM | POA: Diagnosis not present

## 2020-01-29 ENCOUNTER — Ambulatory Visit: Payer: BC Managed Care – PPO | Admitting: Obstetrics and Gynecology

## 2020-02-02 ENCOUNTER — Ambulatory Visit: Payer: BC Managed Care – PPO

## 2020-02-04 ENCOUNTER — Other Ambulatory Visit: Payer: Self-pay

## 2020-02-04 ENCOUNTER — Ambulatory Visit: Payer: BC Managed Care – PPO | Attending: General Surgery

## 2020-02-04 DIAGNOSIS — Z483 Aftercare following surgery for neoplasm: Secondary | ICD-10-CM | POA: Insufficient documentation

## 2020-02-04 DIAGNOSIS — R293 Abnormal posture: Secondary | ICD-10-CM | POA: Insufficient documentation

## 2020-02-04 DIAGNOSIS — I972 Postmastectomy lymphedema syndrome: Secondary | ICD-10-CM

## 2020-02-04 DIAGNOSIS — L599 Disorder of the skin and subcutaneous tissue related to radiation, unspecified: Secondary | ICD-10-CM | POA: Insufficient documentation

## 2020-02-04 NOTE — Therapy (Signed)
[place], [place], [address] Phone: [phone]   Fax:  [phone]  Physical Therapy Treatment  Patient Details  Name: Darlene Grant MRN: [id number] Date of Birth: July 30, 1975 Referring Provider (PT): Dr. Nicholas Lose; Dr. Clovia Cuff   Encounter Date: 02/04/2020   PT End of Session - 02/04/20 1016    Visit Number 10    Number of Visits 15    Date for PT Re-Evaluation 02/24/20    Authorization Type BCBS    PT Start Time 0902    PT Stop Time 1002    PT Time Calculation (min) 60 min    Activity Tolerance Patient tolerated treatment well    Behavior During Therapy [facility] for tasks assessed/performed           Past Medical History:  Diagnosis Date   Abnormal Pap smear    ASCUS 03/13/11   Anemia    Breast cancer ([place])    Complication of anesthesia    pt states that all narcotics make her angry and she would prefer to avoid them.   Grade I hemorrhoids 02/23/2017   Piriformis syndrome of left side 01/15/2018   Vaginal Pap smear, abnormal     Past Surgical History:  Procedure Laterality Date   BREAST BIOPSY Right 8/16   CESAREAN SECTION N/A 11/21/2013   Procedure: CESAREAN SECTION;  Surgeon: Alwyn Pea, MD;  Location: [facility];  Service: Obstetrics;  Laterality: N/A;   CESAREAN SECTION N/A 06/23/2016   Procedure: CESAREAN SECTION;  Surgeon: Emily Filbert, MD;  Location: [place];  Service: Obstetrics;  Laterality: N/A;   HAND SURGERY  04/2012   ligament repair   MASTECTOMY WITH AXILLARY LYMPH NODE DISSECTION Bilateral 11/14/2018   Procedure: RIGHT MASTECTOMY WITH RIGHT AXILLARY LYMPH NODE DISSECTION AND LEFT RISK REDUCING MASTECTOMY;  Surgeon: Rolm Bookbinder, MD;  Location: [facility];  Service: General;  Laterality: Bilateral;  LATE ENTRY: Corrected laterality documentation to read right axillary lymph node dissection vs. documented laterality of left.      PILONIDAL CYST EXCISION  2000   PORTACATH PLACEMENT N/A 05/28/2018   Procedure: INSERTION PORT-A-CATH WITH ULTRASOUND;  Surgeon: Rolm Bookbinder, MD;  Location: [place];  Service: General;  Laterality: N/A;   WISDOM TOOTH EXTRACTION  AGE 15    There were no vitals filed for this visit.   Subjective Assessment - 02/04/20 0914    Subjective I haven't got my new flat knit yet, but am supposed to pick that up today. I also got to the [employer] for night wear and have been wearing it but it was supposed to have finger separaters and it doesn't so my hand has been feeling fuller.    Pertinent History breast cancer diagnosed November 2019: triple negative with Ki of 75% She completed chemotherapy 10/18/2018. 11/14/2018: she underwent bilateral mastectomy with 0/13 nodes positive on the right; radiation ended on 02/10/2019    Patient Stated Goals to not get lymphedema    Currently in Pain? No/denies                  L-DEX FLOWSHEETS - 02/04/20 1000      L-DEX LYMPHEDEMA SCREENING   BASELINE SCORE (UNILATERAL) -4.8    L-DEX SCORE (UNILATERAL) 12.2    VALUE CHANGE (UNILAT) 17                     [facility] Adult PT Treatment/Exercise - 02/04/20 0001  Manual Therapy   Myofascial Release to anterior chest and axilla    Manual Lymphatic Drainage (MLD) in supine for short neck, superficial and deep abdominals, right inguinal nodes and right axillo-inguinal anastamosis, left axillary nodes and anterior interaxillary anastamosis; then focused on Rt UE working from lateral upper arm, medial to lateral, and lateral again, then ant/post forearm and focusing here when retracing steps, then dorsum of hand retracing all steps reviewing technique with pt throughout having her return demo                    PT Short Term Goals - 03/31/19 1246      PT [place] #1   Title independent with initial HEP for left hip    Time 4    Period Weeks    Status  Achieved      PT SHORT TERM GOAL #2   Title understand how to take care of vaginal health to reduce dryness and keep tissue mobility    Period Weeks    Status Achieved             PT Long Term Goals - 01/13/20 3818      PT LONG TERM GOAL #1   Title Pt will have reduction in L-Dex score to < 10    Baseline 14.5 on 12/09/2019; unable to take today as couldn't get L-Dex to pair with bluetooth, will retake at next session, however pt does have less fibrosis in forearm-01/13/20    Status On-going      PT LONG TERM GOAL #2   Title Pt will report she is confident in her independence to use self MLD and use of compression to manage her lymphedema    Baseline Pt is feeling more confident with use of compression and is working towards confidence with self MLD (confident with UE sequence, wants to be independent with abdominal sequences as well)-01/13/20    Status Partially Met      PT LONG TERM GOAL #3   Title Pt will report she has returned to gradually progressive resistive exercise for upper body to decrease lymphedema risk    Baseline Pt reports though is working towards thi slowly, she does feel confident about how to do this-01/13/20    Status Achieved                 Plan - 02/04/20 1019    Clinical Impression Statement L-Dex score retested and pt has had some reduction since last measured with a score of 12.2, but this is still showing elevated lymph fluid volume comparitively to Lt UE. She repors nighttime garment doesn't fit right at hand (was supposed to have finger separaters and does not and dorsum is too loose), and her flat knit has still not arrived (came in too short) so for now pt doesn't have best fitting garments and she has been exercising more as of late. All of this combined explains why she is seeing some reduction, but not as much as she could by now. As pt was understandably frustrated with mild results of reduction thus far with being compliant with wear of  compression and doing self MLD, spent some of session encouraging pt that once she has better fitting garments she should start seeing better results of reduction. Reviewed technique of stationary circles and pumps on forearm and dorsum of hand with pt, per her request, as she felt she wasn't doing as well as she could here. Overall pt repors feeling  better by end of sessoin and was in fact heading to [facility] to get new flat knit and return [employer] to get hand part resized and finger separaters added as should have been done initially.    Personal Factors and Comorbidities Other    Comorbidities chemo and radiation with cording and fascial tightness after    Examination-Activity Limitations Bathing;Reach Overhead;Caring for Others;Lift;Hygiene/Grooming;Locomotion Level;Sleep    Stability/Clinical Decision Making Evolving/Moderate complexity    Rehab Potential Good    PT Frequency 2x / week    PT Duration 4 weeks    PT Treatment/Interventions ADLs/Self Care Home Management;Therapeutic exercise;Manual techniques;Therapeutic activities;Neuromuscular re-education;Patient/family education;Passive range of motion;Manual lymph drainage;Taping;Compression bandaging;Orthotic Fit/Training    PT Next Visit Plan Assess new flat knit garments if they have arrived; review self MLD assessing technique including superficial and deep abdominals prn, and finalize HEP; cont MLD to Rt UE and MFR to to Rt axilla and lateral trunk    Consulted and Agree with Plan of Care Patient           Patient will benefit from skilled therapeutic intervention in order to improve the following deficits and impairments:  Decreased knowledge of precautions, Decreased knowledge of use of DME, Postural dysfunction, Decreased range of motion, Increased fascial restricitons, Impaired UE functional use, Increased muscle spasms, Impaired perceived functional ability, Pain, Decreased scar mobility, Decreased strength, Increased edema,  Difficulty walking, Decreased activity tolerance, Decreased mobility  Visit Diagnosis: Postmastectomy lymphedema  Abnormal posture  Disorder of the skin and subcutaneous tissue related to radiation, unspecified  Aftercare following surgery for neoplasm     Problem List Patient Active Problem List   Diagnosis Date Noted   lymphedema, right arm 01/15/2020   LAD (lymphadenopathy), submandibular left 01/15/2020   Other fatigue 01/15/2020   Chemotherapy induced neutropenia ([place]) 09/13/2018   Malignant neoplasm of upper-outer quadrant of right breast in female, estrogen receptor negative ([place]) 05/09/2018   Iron deficiency 01/16/2018    Otelia Limes, PTA 02/04/2020, 10:28 AM  [place], [place], [address] Phone: [phone]   Fax:  [phone]  Name: Darlene Grant MRN: [id number] Date of Birth: Apr 27, 1976

## 2020-02-09 ENCOUNTER — Encounter: Payer: BC Managed Care – PPO | Admitting: Physical Therapy

## 2020-02-11 ENCOUNTER — Ambulatory Visit: Payer: BC Managed Care – PPO

## 2020-02-11 ENCOUNTER — Other Ambulatory Visit: Payer: Self-pay

## 2020-02-11 DIAGNOSIS — R293 Abnormal posture: Secondary | ICD-10-CM

## 2020-02-11 DIAGNOSIS — I972 Postmastectomy lymphedema syndrome: Secondary | ICD-10-CM | POA: Diagnosis not present

## 2020-02-11 DIAGNOSIS — L599 Disorder of the skin and subcutaneous tissue related to radiation, unspecified: Secondary | ICD-10-CM | POA: Diagnosis not present

## 2020-02-11 DIAGNOSIS — Z483 Aftercare following surgery for neoplasm: Secondary | ICD-10-CM | POA: Diagnosis not present

## 2020-02-11 NOTE — Therapy (Signed)
[place], [place], [address] Phone: [phone]   Fax:  [phone]  Physical Therapy Treatment  Patient Details  Name: Darlene Grant MRN: [id number] Date of Birth: 1976/05/18 Referring Provider (PT): Dr. Nicholas Lose; Dr. Clovia Cuff   Encounter Date: 02/11/2020   PT End of Session - 02/11/20 1109    Visit Number 11    Number of Visits 15    Date for PT Re-Evaluation 02/24/20    Authorization Type BCBS    PT Start Time 1006    PT Stop Time 1110    PT Time Calculation (min) 64 min    Activity Tolerance Patient tolerated treatment well    Behavior During Therapy [facility] for tasks assessed/performed           Past Medical History:  Diagnosis Date  . Abnormal Pap smear    ASCUS 03/13/11  . Anemia   . Breast cancer ([place])   . Complication of anesthesia    pt states that all narcotics make her angry and she would prefer to avoid them.  . Grade I hemorrhoids 02/23/2017  . Piriformis syndrome of left side 01/15/2018  . Vaginal Pap smear, abnormal     Past Surgical History:  Procedure Laterality Date  . BREAST BIOPSY Right 8/16  . CESAREAN SECTION N/A 11/21/2013   Procedure: CESAREAN SECTION;  Surgeon: Alwyn Pea, MD;  Location: [facility];  Service: Obstetrics;  Laterality: N/A;  . CESAREAN SECTION N/A 06/23/2016   Procedure: CESAREAN SECTION;  Surgeon: Emily Filbert, MD;  Location: [facility];  Service: Obstetrics;  Laterality: N/A;  . HAND SURGERY  04/2012   ligament repair  . MASTECTOMY WITH AXILLARY LYMPH NODE DISSECTION Bilateral 11/14/2018   Procedure: RIGHT MASTECTOMY WITH RIGHT AXILLARY LYMPH NODE DISSECTION AND LEFT RISK REDUCING MASTECTOMY;  Surgeon: Rolm Bookbinder, MD;  Location: [facility];  Service: General;  Laterality: Bilateral;  LATE ENTRY: Corrected laterality documentation to read right axillary lymph node dissection vs. documented laterality of left.     Marland Kitchen PILONIDAL CYST EXCISION  2000  . PORTACATH PLACEMENT N/A 05/28/2018   Procedure: INSERTION PORT-A-CATH WITH ULTRASOUND;  Surgeon: Rolm Bookbinder, MD;  Location: [facility];  Service: General;  Laterality: N/A;  . WISDOM TOOTH EXTRACTION  AGE 35    There were no vitals filed for this visit.   Subjective Assessment - 02/11/20 1018    Subjective My flat knit came in again and was still wrong so reordering again. I gave Jamela my nighttime garment for them to add the finger separaters and she gave me a Tribute to wear until my custom nighttime comes in. The tribute makes my thumb numb though, but other than that I like the tribute.    Pertinent History breast cancer diagnosed November 2019: triple negative with Ki of 75% She completed chemotherapy 10/18/2018. 11/14/2018: she underwent bilateral mastectomy with 0/13 nodes positive on the right; radiation ended on 02/10/2019    Patient Stated Goals to not get lymphedema    Currently in Pain? No/denies                             [facility] Adult PT Treatment/Exercise - 02/11/20 0001      Manual Therapy   Edema Management Issued circle gray foam for pt to wear over her thumb in her tribute and this seemed to help some.  Assessed other garments  she has been wearing and determined fit of older flat knit is okay for now, also she was given a Tribute to temporarily wear at night until her custom fit garment comes in (gray foam for thumb was for this). Also encouraged pt okay to compression bandage hand if thumb continues to be painful/numb with triibute hand piece.     Myofascial Release to anterior chest and axilla    Manual Lymphatic Drainage (MLD) in supine for short neck, superficial and deep abdominals, right inguinal nodes and right axillo-inguinal anastamosis, left axillary nodes and anterior interaxillary anastamosis; then focused on Rt UE working from lateral upper arm, medial to lateral, and lateral again, then  ant/post forearm and focusing here when retracing steps, then dorsum of hand retracing all steps reviewing technique with pt throughout having her return demo                    PT Short Term Goals - 03/31/19 1246      PT [place] #1   Title independent with initial HEP for left hip    Time 4    Period Weeks    Status Achieved      PT SHORT TERM GOAL #2   Title understand how to take care of vaginal health to reduce dryness and keep tissue mobility    Period Weeks    Status Achieved             PT Long Term Goals - 01/13/20 7824      PT LONG TERM GOAL #1   Title Pt will have reduction in L-Dex score to < 10    Baseline 14.5 on 12/09/2019; unable to take today as couldn't get L-Dex to pair with bluetooth, will retake at next session, however pt does have less fibrosis in forearm-01/13/20    Status On-going      PT LONG TERM GOAL #2   Title Pt will report she is confident in her independence to use self MLD and use of compression to manage her lymphedema    Baseline Pt is feeling more confident with use of compression and is working towards confidence with self MLD (confident with UE sequence, wants to be independent with abdominal sequences as well)-01/13/20    Status Partially Met      PT LONG TERM GOAL #3   Title Pt will report she has returned to gradually progressive resistive exercise for upper body to decrease lymphedema risk    Baseline Pt reports though is working towards thi slowly, she does feel confident about how to do this-01/13/20    Status Achieved                 Plan - 02/11/20 1214    Clinical Impression Statement Pt brought all current compression garments she has and we assesssed and determied what was good to wear for now until new custom garments come in. She was told by fitter at [place] that due to covid she has been having trouble with alot of custom garments lately, as this clinician has noticed with other pts as well. She  seemed to feel better about garment situation for now until new garments arrive. Then focused rest of session of manual lymph drainage of Rt UE, spending time at forearm where tends to be most congested, though this was soft today. Also conintued with MFR around Rt pect insertion where area of tightness reported by pt and noted by therapist. Pt verbalizes interest  to learn arm bandage at next session.    Personal Factors and Comorbidities Other    Comorbidities chemo and radiation with cording and fascial tightness after    Examination-Activity Limitations Bathing;Reach Overhead;Caring for Others;Lift;Hygiene/Grooming;Locomotion Level;Sleep    Stability/Clinical Decision Making Evolving/Moderate complexity    Rehab Potential Good    PT Frequency 2x / week    PT Duration 4 weeks    PT Treatment/Interventions ADLs/Self Care Home Management;Therapeutic exercise;Manual techniques;Therapeutic activities;Neuromuscular re-education;Patient/family education;Passive range of motion;Manual lymph drainage;Taping;Compression bandaging;Orthotic Fit/Training    PT Next Visit Plan Instruct pt in UE compressoin bandage (issued 6 cm today, will need 10 and 12 cm) Assess new flat knit garments if they have arrived, finalize HEP prn; cont MLD to Rt UE and MFR to to Rt axilla and lateral trunk    PT Home Exercise Plan Cont daily self MLD and wear of compression garments    Consulted and Agree with Plan of Care Patient           Patient will benefit from skilled therapeutic intervention in order to improve the following deficits and impairments:  Decreased knowledge of precautions, Decreased knowledge of use of DME, Postural dysfunction, Decreased range of motion, Increased fascial restricitons, Impaired UE functional use, Increased muscle spasms, Impaired perceived functional ability, Pain, Decreased scar mobility, Decreased strength, Increased edema, Difficulty walking, Decreased activity tolerance, Decreased  mobility  Visit Diagnosis: Postmastectomy lymphedema  Abnormal posture  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 lymphedema, right arm 01/15/2020  . LAD (lymphadenopathy), submandibular left 01/15/2020  . Other fatigue 01/15/2020  . Chemotherapy induced neutropenia ([place]) 09/13/2018  . Malignant neoplasm of upper-outer quadrant of right breast in female, estrogen receptor negative ([place]) 05/09/2018  . Iron deficiency 01/16/2018    Otelia Limes, PTA 02/11/2020, 12:20 PM  [place], [place], [address] Phone: [phone]   Fax:  [phone]  Name: Darlene Grant MRN: [id number] Date of Birth: 1975-09-06

## 2020-02-16 ENCOUNTER — Encounter: Payer: BC Managed Care – PPO | Admitting: Physical Therapy

## 2020-02-18 ENCOUNTER — Other Ambulatory Visit: Payer: Self-pay

## 2020-02-18 ENCOUNTER — Ambulatory Visit: Payer: BC Managed Care – PPO | Attending: General Surgery

## 2020-02-18 DIAGNOSIS — R293 Abnormal posture: Secondary | ICD-10-CM | POA: Diagnosis not present

## 2020-02-18 DIAGNOSIS — I972 Postmastectomy lymphedema syndrome: Secondary | ICD-10-CM | POA: Diagnosis not present

## 2020-02-18 DIAGNOSIS — Z483 Aftercare following surgery for neoplasm: Secondary | ICD-10-CM | POA: Diagnosis not present

## 2020-02-18 DIAGNOSIS — L599 Disorder of the skin and subcutaneous tissue related to radiation, unspecified: Secondary | ICD-10-CM | POA: Insufficient documentation

## 2020-02-18 NOTE — Therapy (Signed)
[place], [place], [address] Phone: [phone]   Fax:  [phone]  Physical Therapy Treatment  Patient Details  Name: Darlene Grant MRN: [id number] Date of Birth: 09-09-1975 Referring Provider (PT): Dr. Nicholas Lose; Dr. Clovia Cuff   Encounter Date: 02/18/2020   PT End of Session - 02/18/20 1127    Visit Number 12    Number of Visits 15    Date for PT Re-Evaluation 02/24/20    Authorization Type BCBS    PT Start Time 1003    PT Stop Time 1058    PT Time Calculation (min) 55 min    Activity Tolerance Patient tolerated treatment well    Behavior During Therapy [facility] for tasks assessed/performed           Past Medical History:  Diagnosis Date  . Abnormal Pap smear    ASCUS 03/13/11  . Anemia   . Breast cancer ([place])   . Complication of anesthesia    pt states that all narcotics make her angry and she would prefer to avoid them.  . Grade I hemorrhoids 02/23/2017  . Piriformis syndrome of left side 01/15/2018  . Vaginal Pap smear, abnormal     Past Surgical History:  Procedure Laterality Date  . BREAST BIOPSY Right 8/16  . CESAREAN SECTION N/A 11/21/2013   Procedure: CESAREAN SECTION;  Surgeon: Alwyn Pea, MD;  Location: [facility];  Service: Obstetrics;  Laterality: N/A;  . CESAREAN SECTION N/A 06/23/2016   Procedure: CESAREAN SECTION;  Surgeon: Emily Filbert, MD;  Location: [facility];  Service: Obstetrics;  Laterality: N/A;  . HAND SURGERY  04/2012   ligament repair  . MASTECTOMY WITH AXILLARY LYMPH NODE DISSECTION Bilateral 11/14/2018   Procedure: RIGHT MASTECTOMY WITH RIGHT AXILLARY LYMPH NODE DISSECTION AND LEFT RISK REDUCING MASTECTOMY;  Surgeon: Rolm Bookbinder, MD;  Location: [facility];  Service: General;  Laterality: Bilateral;  LATE ENTRY: Corrected laterality documentation to read right axillary lymph node dissection vs. documented laterality of left.     Marland Kitchen PILONIDAL CYST EXCISION  2000  . PORTACATH PLACEMENT N/A 05/28/2018   Procedure: INSERTION PORT-A-CATH WITH ULTRASOUND;  Surgeon: Rolm Bookbinder, MD;  Location: [facility];  Service: General;  Laterality: N/A;  . WISDOM TOOTH EXTRACTION  AGE 63    There were no vitals filed for this visit.   Subjective Assessment - 02/18/20 1107    Subjective I got an ExoStrong sleeve from [facility] at [place] and absolutely love it! It fits good and the length is great. Still haven't gotten a good fitting gauntlet and can tell my hand has some increased swelling, but I've been wrapping it at night which always brings it back down. I do want to learn to wrap my arm today to have another option if the swelling gets worse and to use at night prn.    Pertinent History breast cancer diagnosed November 2019: triple negative with Ki of 75% She completed chemotherapy 10/18/2018. 11/14/2018: she underwent bilateral mastectomy with 0/13 nodes positive on the right; radiation ended on 02/10/2019    Patient Stated Goals to not get lymphedema    Currently in Pain? No/denies                             [facility] Adult PT Treatment/Exercise - 02/18/20 0001      Manual Therapy   Edema Management Assessed new  ExoStrong sleeve and this appears to be a very good fit. Did educate pt to get rubber gloves to help her adjust sleeve to counteract it sliding down during the day as this has been a problem for her.     Compression Bandaging Spent entire session today instructing pt in how to self bandage her Rt UE. Used TG soft size med, Elastomull to fingers 1-4, instructed in use of Artiflex but that she may not need to use it if only warpping at night, and then 1-6, 1-10 and 1-12 cm short stretch compression bandages issuing packaging for each so she can reordered prn.                   PT Education - 02/18/20 1127    Education Details Self compression bandaging to Rt UE    Person(s)  Educated Patient    Methods Explanation;Demonstration;Handout    Comprehension Verbalized understanding;Returned demonstration;Need further instruction            PT Short Term Goals - 03/31/19 1246      PT SHORT TERM GOAL #1   Title independent with initial HEP for left hip    Time 4    Period Weeks    Status Achieved      PT SHORT TERM GOAL #2   Title understand how to take care of vaginal health to reduce dryness and keep tissue mobility    Period Weeks    Status Achieved             PT Long Term Goals - 01/13/20 2536      PT LONG TERM GOAL #1   Title Pt will have reduction in L-Dex score to < 10    Baseline 14.5 on 12/09/2019; unable to take today as couldn't get L-Dex to pair with bluetooth, will retake at next session, however pt does have less fibrosis in forearm-01/13/20    Status On-going      PT LONG TERM GOAL #2   Title Pt will report she is confident in her independence to use self MLD and use of compression to manage her lymphedema    Baseline Pt is feeling more confident with use of compression and is working towards confidence with self MLD (confident with UE sequence, wants to be independent with abdominal sequences as well)-01/13/20    Status Partially Met      PT LONG TERM GOAL #3   Title Pt will report she has returned to gradually progressive resistive exercise for upper body to decrease lymphedema risk    Baseline Pt reports though is working towards thi slowly, she does feel confident about how to do this-01/13/20    Status Achieved                 Plan - 02/18/20 1128    Clinical Impression Statement Darlene Grant wore her new ExoStrong compression sleeve with old gauntlet to therapy today. The sleeve is excellent fit for her arm and she is very happy with this. New gauntlet has yet to arrive but she has been wrapping her hand prn to assist with current gauntlet not offering best compression/fit. She had also expressed interest at last session in  learning to self wrap her arm prn so spent majority of session instructing her in this today. She was able to return good demo after multiple practice attempts and verbalizes good understanding. Handout issued with steps. Also encouraged pt that she is doing excellent with her self care and working  towards being independent with Maintenance Phase of lymphedema treatment as she is overwhelmed at times dealing with her newer diagnosis of lymphedema, caring for her 2 young children and because her father is in assisted living and has had multiple health issues while there and she has been Writer of his care. Overall Darlene Grant is making excellent progress.    Personal Factors and Comorbidities Other    Comorbidities chemo and radiation with cording and fascial tightness after    Examination-Activity Limitations Bathing;Reach Overhead;Caring for Others;Lift;Hygiene/Grooming;Locomotion Level;Sleep    Stability/Clinical Decision Making Evolving/Moderate complexity    Rehab Potential Good    PT Frequency 2x / week    PT Duration 4 weeks    PT Treatment/Interventions ADLs/Self Care Home Management;Therapeutic exercise;Manual techniques;Therapeutic activities;Neuromuscular re-education;Patient/family education;Passive range of motion;Manual lymph drainage;Taping;Compression bandaging;Orthotic Fit/Training    PT Next Visit Plan Review self bandaging to assess technique; assess new gauntlet if this arrives. Ready for D/C in next 1-2 sessions?    PT Home Exercise Plan Cont daily self MLD and wear of compression garments; self compression bandage prn    Consulted and Agree with Plan of Care Patient           Patient will benefit from skilled therapeutic intervention in order to improve the following deficits and impairments:  Decreased knowledge of precautions, Decreased knowledge of use of DME, Postural dysfunction, Decreased range of motion, Increased fascial restricitons, Impaired UE functional use,  Increased muscle spasms, Impaired perceived functional ability, Pain, Decreased scar mobility, Decreased strength, Increased edema, Difficulty walking, Decreased activity tolerance, Decreased mobility  Visit Diagnosis: Postmastectomy lymphedema  Abnormal posture  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 lymphedema, right arm 01/15/2020  . LAD (lymphadenopathy), submandibular left 01/15/2020  . Other fatigue 01/15/2020  . Chemotherapy induced neutropenia ([place]) 09/13/2018  . Malignant neoplasm of upper-outer quadrant of right breast in female, estrogen receptor negative ([place]) 05/09/2018  . Iron deficiency 01/16/2018    Otelia Limes, PTA 02/18/2020, 11:46 AM  [place], [place], [address] Phone: [phone]   Fax:  [phone]  Name: Darlene Grant MRN: [id number] Date of Birth: 03-Apr-1976

## 2020-02-20 ENCOUNTER — Telehealth: Payer: Self-pay | Admitting: Hematology and Oncology

## 2020-02-20 NOTE — Telephone Encounter (Signed)
Scheduled appointment per 9/2 scheduling message. Left message for patient with appointment date and time.

## 2020-02-24 ENCOUNTER — Ambulatory Visit: Payer: BC Managed Care – PPO

## 2020-02-24 ENCOUNTER — Other Ambulatory Visit: Payer: Self-pay

## 2020-02-24 DIAGNOSIS — R293 Abnormal posture: Secondary | ICD-10-CM

## 2020-02-24 DIAGNOSIS — L599 Disorder of the skin and subcutaneous tissue related to radiation, unspecified: Secondary | ICD-10-CM

## 2020-02-24 DIAGNOSIS — I972 Postmastectomy lymphedema syndrome: Secondary | ICD-10-CM

## 2020-02-24 DIAGNOSIS — Z483 Aftercare following surgery for neoplasm: Secondary | ICD-10-CM

## 2020-02-24 NOTE — Therapy (Signed)
[place], [place], [address] Phone: [phone]   Fax:  [phone]  Physical Therapy Treatment  Patient Details  Name: Darlene Grant MRN: [id number] Date of Birth: 1975-07-24 Referring Provider (PT): Dr. Nicholas Lose; Dr. Clovia Cuff   Encounter Date: 02/24/2020   PT End of Session - 02/24/20 1205    Visit Number 13    Number of Visits 15    Date for PT Re-Evaluation 02/24/20    Authorization Type BCBS    PT Start Time 1105    PT Stop Time 1205    PT Time Calculation (min) 60 min    Activity Tolerance Patient tolerated treatment well    Behavior During Therapy [facility] for tasks assessed/performed           Past Medical History:  Diagnosis Date  . Abnormal Pap smear    ASCUS 03/13/11  . Anemia   . Breast cancer ([place])   . Complication of anesthesia    pt states that all narcotics make her angry and she would prefer to avoid them.  . Grade I hemorrhoids 02/23/2017  . Piriformis syndrome of left side 01/15/2018  . Vaginal Pap smear, abnormal     Past Surgical History:  Procedure Laterality Date  . BREAST BIOPSY Right 8/16  . CESAREAN SECTION N/A 11/21/2013   Procedure: CESAREAN SECTION;  Surgeon: Alwyn Pea, MD;  Location: [facility];  Service: Obstetrics;  Laterality: N/A;  . CESAREAN SECTION N/A 06/23/2016   Procedure: CESAREAN SECTION;  Surgeon: Emily Filbert, MD;  Location: [facility];  Service: Obstetrics;  Laterality: N/A;  . HAND SURGERY  04/2012   ligament repair  . MASTECTOMY WITH AXILLARY LYMPH NODE DISSECTION Bilateral 11/14/2018   Procedure: RIGHT MASTECTOMY WITH RIGHT AXILLARY LYMPH NODE DISSECTION AND LEFT RISK REDUCING MASTECTOMY;  Surgeon: Rolm Bookbinder, MD;  Location: [facility];  Service: General;  Laterality: Bilateral;  LATE ENTRY: Corrected laterality documentation to read right axillary lymph node dissection vs. documented laterality of left.     Marland Kitchen PILONIDAL CYST EXCISION  2000  . PORTACATH PLACEMENT N/A 05/28/2018   Procedure: INSERTION PORT-A-CATH WITH ULTRASOUND;  Surgeon: Rolm Bookbinder, MD;  Location: [facility];  Service: General;  Laterality: N/A;  . WISDOM TOOTH EXTRACTION  AGE 44    There were no vitals filed for this visit.   Subjective Assessment - 02/24/20 1118    Subjective I got my new custom compression sleeve and gauntlet and they fit great. I am happy with these. I brought a size medium Tribute hand piece I want you to check out. If you think it's good then I'm good with that garment as well. I feel like I'm almost there.    Pertinent History breast cancer diagnosed November 2019: triple negative with Ki of 75% She completed chemotherapy 10/18/2018. 11/14/2018: she underwent bilateral mastectomy with 0/13 nodes positive on the right; radiation ended on 02/10/2019    Patient Stated Goals to not get lymphedema    Currently in Pain? No/denies                             [facility] Adult PT Treatment/Exercise - 02/24/20 0001      Manual Therapy   Manual therapy comments Issued a few small dotted and lined peach medi foam pieces for pt to wear under gauntlet prn at dorsum of hand    Edema Management  Assesed new compression sleeve and Tribute hand piece and all are good fit.     Manual Lymphatic Drainage (MLD) in supine for short neck, superficial and deep abdominals, right inguinal nodes and right axillo-inguinal anastamosis, left axillary nodes and anterior interaxillary anastamosis; then focused on Rt UE working from lateral upper arm, medial to lateral, and lateral again, then ant/post forearm and focusing here when retracing steps, then dorsum of hand retracing all steps reviewing technique with pt throughout having her return demo                    PT Short Term Goals - 03/31/19 1246      PT [place] #1   Title independent with initial HEP for left hip    Time 4     Period Weeks    Status Achieved      PT SHORT TERM GOAL #2   Title understand how to take care of vaginal health to reduce dryness and keep tissue mobility    Period Weeks    Status Achieved             PT Long Term Goals - 01/13/20 3329      PT LONG TERM GOAL #1   Title Pt will have reduction in L-Dex score to < 10    Baseline 14.5 on 12/09/2019; unable to take today as couldn't get L-Dex to pair with bluetooth, will retake at next session, however pt does have less fibrosis in forearm-01/13/20    Status On-going      PT LONG TERM GOAL #2   Title Pt will report she is confident in her independence to use self MLD and use of compression to manage her lymphedema    Baseline Pt is feeling more confident with use of compression and is working towards confidence with self MLD (confident with UE sequence, wants to be independent with abdominal sequences as well)-01/13/20    Status Partially Met      PT LONG TERM GOAL #3   Title Pt will report she has returned to gradually progressive resistive exercise for upper body to decrease lymphedema risk    Baseline Pt reports though is working towards thi slowly, she does feel confident about how to do this-01/13/20    Status Achieved                 Plan - 02/24/20 1345    Clinical Impression Statement Pt wore her new custom flat knit compression sleeve and gauntlet, along with brought new Tribute hand piece. She reports all were comfortable and all were great fit. Issued extra peach Media small dotted and lined foam for her to use at dorsal hand prn under gauntlet as well. Pt is making excellent progress towards D/C now that she has almost all new compression garments. Just awaiting new triubte sleeve. Probable D/C next.    Personal Factors and Comorbidities Other    Comorbidities chemo and radiation with cording and fascial tightness after    Examination-Activity Limitations Bathing;Reach Overhead;Caring for  Others;Lift;Hygiene/Grooming;Locomotion Level;Sleep    Stability/Clinical Decision Making Evolving/Moderate complexity    Rehab Potential Good    PT Frequency 2x / week    PT Duration 4 weeks    PT Treatment/Interventions ADLs/Self Care Home Management;Therapeutic exercise;Manual techniques;Therapeutic activities;Neuromuscular re-education;Patient/family education;Passive range of motion;Manual lymph drainage;Taping;Compression bandaging;Orthotic Fit/Training    PT Next Visit Plan Probable D/C next so review (any) self bandaging to assess technique of fingers prn; assess tribute sleeve  PT Home Exercise Plan Cont daily self MLD and wear of compression garments; self compression bandage prn    Consulted and Agree with Plan of Care Patient           Patient will benefit from skilled therapeutic intervention in order to improve the following deficits and impairments:  Decreased knowledge of precautions, Decreased knowledge of use of DME, Postural dysfunction, Decreased range of motion, Increased fascial restricitons, Impaired UE functional use, Increased muscle spasms, Impaired perceived functional ability, Pain, Decreased scar mobility, Decreased strength, Increased edema, Difficulty walking, Decreased activity tolerance, Decreased mobility  Visit Diagnosis: Postmastectomy lymphedema  Abnormal posture  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 lymphedema, right arm 01/15/2020  . LAD (lymphadenopathy), submandibular left 01/15/2020  . Other fatigue 01/15/2020  . Chemotherapy induced neutropenia ([place]) 09/13/2018  . Malignant neoplasm of upper-outer quadrant of right breast in female, estrogen receptor negative ([place]) 05/09/2018  . Iron deficiency 01/16/2018    Otelia Limes, PTA 02/24/2020, 1:48 PM  [place], [place], [address] Phone: [phone]   Fax:  [phone]  Name: Darlene Grant MRN: [id number] Date of Birth: 1975-07-29

## 2020-02-26 ENCOUNTER — Encounter: Payer: Self-pay | Admitting: Hematology and Oncology

## 2020-03-01 ENCOUNTER — Encounter: Payer: BC Managed Care – PPO | Admitting: Physical Therapy

## 2020-03-01 ENCOUNTER — Ambulatory Visit: Payer: BC Managed Care – PPO

## 2020-03-05 ENCOUNTER — Inpatient Hospital Stay: Payer: BC Managed Care – PPO | Attending: Hematology and Oncology

## 2020-03-05 ENCOUNTER — Other Ambulatory Visit: Payer: Self-pay

## 2020-03-05 DIAGNOSIS — Z23 Encounter for immunization: Secondary | ICD-10-CM

## 2020-03-05 NOTE — Progress Notes (Signed)
   Covid-19 Vaccination Clinic  Name:  MIONNA ADVINCULA    MRN: [id number] DOB: 14-Apr-1976  03/05/2020  Ms. Beatrice-Mitchell was observed post Covid-19 immunization for 15 minutes without incident. She was provided with Vaccine Information Sheet and instruction to access the V-Safe system.   Ms. Lacewell was instructed to call 911 with any severe reactions post vaccine: Marland Kitchen Difficulty breathing  . Swelling of face and throat  . A fast heartbeat  . A bad rash all over body  . Dizziness and weakness

## 2020-03-08 ENCOUNTER — Ambulatory Visit (INDEPENDENT_AMBULATORY_CARE_PROVIDER_SITE_OTHER): Payer: BC Managed Care – PPO | Admitting: Obstetrics & Gynecology

## 2020-03-08 ENCOUNTER — Encounter: Payer: Self-pay | Admitting: *Deleted

## 2020-03-08 ENCOUNTER — Other Ambulatory Visit: Payer: Self-pay | Admitting: *Deleted

## 2020-03-08 ENCOUNTER — Other Ambulatory Visit: Payer: Self-pay

## 2020-03-08 ENCOUNTER — Encounter: Payer: Self-pay | Admitting: Obstetrics & Gynecology

## 2020-03-08 ENCOUNTER — Encounter: Payer: BC Managed Care – PPO | Admitting: Physical Therapy

## 2020-03-08 VITALS — BP 100/63 | HR 69 | Resp 16 | Ht 64.5 in | Wt 140.0 lb

## 2020-03-08 DIAGNOSIS — R5382 Chronic fatigue, unspecified: Secondary | ICD-10-CM

## 2020-03-08 DIAGNOSIS — N951 Menopausal and female climacteric states: Secondary | ICD-10-CM

## 2020-03-08 MED ORDER — CYCLOBENZAPRINE HCL 10 MG PO TABS: 10.0000 mg | ORAL_TABLET | ORAL | 2 refills | Status: DC | PRN

## 2020-03-08 MED ORDER — HYDROCORTISONE 2.5 % EX CREA: TOPICAL_CREAM | CUTANEOUS | 2 refills | Status: DC | PRN

## 2020-03-08 NOTE — Progress Notes (Signed)
Subjective:     Darlene Grant is a 44 y.o. female here for a routine exam.  Current complaints: itching; fatigue.  Would like labs checked.  Started back her period.  Does not have one every month.  No menopausal symptoms.  No vaginal dryness.  Followed by oncology for stage 3C breast cancer.  Triple negative. Finished therapy and in remission.   Gynecologic History Patient's last menstrual period was 02/22/2020. Contraception: vasectomy Last Pap: 2020. Results were: normal Last mammogram: under surveillance for breast cancer  Obstetric History OB History  Gravida Para Term Preterm AB Living  2 2 2     2   SAB TAB Ectopic Multiple Live Births        0 2    # Outcome Date GA Lbr Len/2nd Weight Sex Delivery Anes PTL Lv  2 Term 06/23/16 [redacted]w[redacted]d  8 lb 14.5 oz (4.04 kg) F CS-LTranv Spinal  LIV  1 Term 11/21/13 [redacted]w[redacted]d  9 lb 4.8 oz (4.218 kg) M CS-LTranv EPI  LIV     The following portions of the patient's history were reviewed and updated as appropriate: allergies, current medications, past family history, past medical history, past social history, past surgical history and problem list.  Review of Systems Pertinent items noted in HPI and remainder of comprehensive ROS otherwise negative.    Objective:      Vitals:   03/08/20 0954  BP: 100/63  Pulse: 69  Resp: 16  Weight: 140 lb (63.5 kg)  Height: 5' 4.5" (1.638 m)   Vitals:  WNL General appearance: alert, cooperative and no distress  HEENT: Normocephalic, without obvious abnormality, atraumatic Eyes: negative Throat: lips, mucosa, and tongue normal; teeth and gums normal  Respiratory: Clear to auscultation bilaterally  CV: Regular rate and rhythm  Breasts:  Surgically absent.  No masses on chest wall or supraclavicular/axillary  GI: Soft, non-tender; bowel sounds normal; no masses,  no organomegaly  GU: External Genitalia:  Tanner V, no lesion Urethra:  No prolapse   Vagina: Pink, normal rugae, no blood or  discharge  Cervix: No CMT, no lesion  Uterus:  Normal size and contour, non tender  Adnexa: Normal, no masses, non tender  Musculoskeletal: No edema, redness or tenderness in the calves or thighs  Skin: No lesions or rash  Lymphatic: Axillary adenopathy: none     Psychiatric: Normal mood and behavior        Assessment:    Healthy female exam.    Plan:   Pap due 2023 Referral to Dr. Madelin Rear like a new PCP Fatigue--TSH, Vit D, CBC, ferritin Will recheck [facility] now that menstruating again.  Pt to keep menstrual calendar.  Just got 3rd Covid booster.   Will get flu in a couple of weeks.

## 2020-03-09 LAB — CBC
HCT: 36.9 % (ref 35.0–45.0)
Hemoglobin: 12.2 g/dL (ref 11.7–15.5)
MCH: 33.5 pg — ABNORMAL HIGH (ref 27.0–33.0)
MCHC: 33.1 g/dL (ref 32.0–36.0)
MCV: 101.4 fL — ABNORMAL HIGH (ref 80.0–100.0)
MPV: 9.8 fL (ref 7.5–12.5)
Platelets: 211 10*3/uL (ref 140–400)
RBC: 3.64 10*6/uL — ABNORMAL LOW (ref 3.80–5.10)
RDW: 12.1 % (ref 11.0–15.0)
WBC: 3 10*3/uL — ABNORMAL LOW (ref 3.8–10.8)

## 2020-03-09 LAB — VITAMIN D 25 HYDROXY (VIT D DEFICIENCY, FRACTURES): Vit D, 25-Hydroxy: 48 ng/mL (ref 30–100)

## 2020-03-09 LAB — FOLLICLE STIMULATING HORMONE: FSH: 19.3 m[IU]/mL

## 2020-03-09 LAB — FERRITIN: Ferritin: 44 ng/mL (ref 16–232)

## 2020-03-09 LAB — TSH: TSH: 0.79 mIU/L

## 2020-03-12 ENCOUNTER — Telehealth: Payer: Self-pay | Admitting: *Deleted

## 2020-03-12 NOTE — Telephone Encounter (Signed)
CALLED PATIENT TO ALTER FU ON 03-15-20 DUE TO DR. KINARD BEING IN THE OR, RESCHEDULED FOR 04-08-20 @ 10:30 AM, PATIENT AGREED TO NEW DATE AND TIME

## 2020-03-15 ENCOUNTER — Ambulatory Visit: Payer: BC Managed Care – PPO | Admitting: Radiation Oncology

## 2020-03-17 ENCOUNTER — Other Ambulatory Visit: Payer: Self-pay

## 2020-03-17 ENCOUNTER — Ambulatory Visit: Payer: BC Managed Care – PPO

## 2020-03-17 DIAGNOSIS — L599 Disorder of the skin and subcutaneous tissue related to radiation, unspecified: Secondary | ICD-10-CM

## 2020-03-17 DIAGNOSIS — Z483 Aftercare following surgery for neoplasm: Secondary | ICD-10-CM

## 2020-03-17 DIAGNOSIS — I972 Postmastectomy lymphedema syndrome: Secondary | ICD-10-CM | POA: Diagnosis not present

## 2020-03-17 DIAGNOSIS — R293 Abnormal posture: Secondary | ICD-10-CM | POA: Diagnosis not present

## 2020-03-17 NOTE — Therapy (Signed)
[place], [place], [address] Phone: [phone]   Fax:  [phone]  Physical Therapy Treatment  Patient Details  Name: Darlene Grant MRN: [id number] Date of Birth: January 15, 1976 Referring Provider (PT): Dr. Nicholas Lose; Dr. Clovia Cuff   Encounter Date: 03/17/2020   PT End of Session - 03/17/20 1114    Visit Number 14    Number of Visits 21    Date for PT Re-Evaluation 04/28/20    Authorization Type BCBS    PT Start Time 1011    PT Stop Time 1107    PT Time Calculation (min) 56 min    Activity Tolerance Patient tolerated treatment well    Behavior During Therapy [facility] for tasks assessed/performed           Past Medical History:  Diagnosis Date  . Abnormal Pap smear    ASCUS 03/13/11  . Anemia   . Breast cancer ([place])   . Complication of anesthesia    pt states that all narcotics make her angry and she would prefer to avoid them.  . Grade I hemorrhoids 02/23/2017  . Piriformis syndrome of left side 01/15/2018  . Vaginal Pap smear, abnormal     Past Surgical History:  Procedure Laterality Date  . BREAST BIOPSY Right 8/16  . CESAREAN SECTION N/A 11/21/2013   Procedure: CESAREAN SECTION;  Surgeon: Alwyn Pea, MD;  Location: [facility];  Service: Obstetrics;  Laterality: N/A;  . CESAREAN SECTION N/A 06/23/2016   Procedure: CESAREAN SECTION;  Surgeon: Emily Filbert, MD;  Location: [facility];  Service: Obstetrics;  Laterality: N/A;  . HAND SURGERY  04/2012   ligament repair  . MASTECTOMY WITH AXILLARY LYMPH NODE DISSECTION Bilateral 11/14/2018   Procedure: RIGHT MASTECTOMY WITH RIGHT AXILLARY LYMPH NODE DISSECTION AND LEFT RISK REDUCING MASTECTOMY;  Surgeon: Rolm Bookbinder, MD;  Location: [facility];  Service: General;  Laterality: Bilateral;  LATE ENTRY: Corrected laterality documentation to read right axillary lymph node dissection vs. documented laterality of left.     Marland Kitchen PILONIDAL CYST EXCISION  2000  . PORTACATH PLACEMENT N/A 05/28/2018   Procedure: INSERTION PORT-A-CATH WITH ULTRASOUND;  Surgeon: Rolm Bookbinder, MD;  Location: [facility];  Service: General;  Laterality: N/A;  . WISDOM TOOTH EXTRACTION  AGE 89    There were no vitals filed for this visit.   Subjective Assessment - 03/17/20 1023    Subjective I kept wearing the Exostrong sleeve and gauntlet but my dorsal hand really started swelling and now I'm having trouble getting it back under control. I know we were going to D/C but I think I need to renew to get this back under control.    Pertinent History breast cancer diagnosed November 2019: triple negative with Ki of 75% She completed chemotherapy 10/18/2018. 11/14/2018: she underwent bilateral mastectomy with 0/13 nodes positive on the right; radiation ended on 02/10/2019    Patient Stated Goals to not get lymphedema    Currently in Pain? No/denies                 LYMPHEDEMA/ONCOLOGY QUESTIONNAIRE - 03/17/20 0001      Right Upper Extremity Lymphedema   10 cm Proximal to Olecranon Process 25.9 cm    Olecranon Process 23.2 cm    15 cm Proximal to Ulnar Styloid Process 24 cm    Just Proximal to Ulnar Styloid Process 15.8 cm    Across Hand at [employer] 19.7  cm    At [facility] of 2nd Digit 6.1 cm                      [facility] Adult PT Treatment/Exercise - 03/17/20 0001      Self-Care   Other Self-Care Comments  Spent beginning of session listening to pt explain what she has tried so far for new onset of Rt dorsal hand swelling. She has been doing good but did suggest she add foam to compression bandaging when she does this as this was something she was not doing before. Added this to compression bandaging doen in clinic today instructing her while therapist bandaged. Also encouraged pt to continue wearing her Tribute glove prn during day (like sitting in car rider line) for increased compression that flat knit  can't give. She has also found a Sigvaris hand nighttime glove with excellent swell spot but they sent her Lt instead of Rt. So used swell spot with bandaging today.       Manual Therapy   Manual Lymphatic Drainage (MLD) in supine for short neck, superficial and deep abdominals, right inguinal nodes and right axillo-inguinal anastamosis, left axillary nodes and anterior interaxillary anastamosis; then focused on Rt UE working from lateral upper arm, medial to lateral, and lateral again, then ant/post forearm and focusing here when retracing steps, then dorsum of hand retracing all steps reviewing technique with pt throughout having her return demo    Compression Bandaging Thick stockinette, (pt requested no artiflex), placed lined peach medi foam longitudinally fomr MTPs to wrist with dorsal swell spot from her new Sigvaris garment on top of this at dorsum of hand (trial directly on skin, pt knows to remove if any pain or tenderness begins and to not do this often), then Elastomull to fingers 1-4, 1-6, 1-10 and 1-12 cm short stetch compression bandage from hand to axilla. Some slight discoloration at fingertips after but pt reports no tingling or pain at all so encouraged her to leave on for now but remove if any changes as it's seeming she may just be very sensitive to any compression                    PT Short Term Goals - 03/31/19 1246      PT SHORT TERM GOAL #1   Title independent with initial HEP for left hip    Time 4    Period Weeks    Status Achieved      PT SHORT TERM GOAL #2   Title understand how to take care of vaginal health to reduce dryness and keep tissue mobility    Period Weeks    Status Achieved             PT Long Term Goals - 03/17/20 1132      PT LONG TERM GOAL #1   Title Pt will have reduction in L-Dex score to < 10    Baseline this goal no longer appropriate as pt has Stage 1 lymphedema and L-Dex only measures subclinical lymphedema-03/17/20    Status  Deferred      PT LONG TERM GOAL #2   Title Pt will report she is confident in her independence to use self MLD and use of compression to manage her lymphedema    Baseline Pt is feeling more confident with use of compression and is working towards confidence with self MLD (confident with UE sequence, wants to be independent with abdominal sequences as well)-01/13/20' pt  had felt independence with bandaging until new flare up of dorsal hand swelling, is indepedent with self MLD-03/17/20    Status Partially Met      PT LONG TERM GOAL #3   Title Pt will report she has returned to gradually progressive resistive exercise for upper body to decrease lymphedema risk    Baseline Pt reports though is working towards this slowly, she does feel confident about how to do this-01/13/20; pt reports independence with exercising at home and feels confident in understanding how to progress "low and slow" with lymphedema-03/17/20    Status Achieved      PT LONG TERM GOAL #4   Title Pt will demonstrate a circumferential reduction of 1 cm at hand across thumb webspace showing she is able to self manage lymphedema symptoms    Baseline 19.7 cm - 03/17/20    Time 6    Period Weeks    Status New                 Plan - 03/17/20 1116    Clinical Impression Statement Pt returns after about 3 weeks since last session feeling frustrated. She has been very compliant with trials of new compression garments that she reports continue to end up giving her pain/tingling in her thumb and has been experiencing increased swelling in the dorsum of her hand despite trials of bandaging and wear Tribute. She reports this does give her some reduction, but nothing has worked to fully reduce her hand as it was before. So today resumed compression bandaging of Rt UE adding new foam to dorsal hand (lined peach medi foam and swell spot from her new removeable Sigvaris hand garment) to help progress circumferential reduction here. Mild  discoloration noted at fingertips after bandage completed but pt reports no pain or tingling so instructed her to leave bandage on knowing this will loosen some and to continue remedial exercises to promote circulation under the abdnages, especially wrist and making a fist/moving fingers. Pt verbalized understanding and returned good demo of each. Also educated her that she may just be more sensitive to these compression changes and this may be why she has been struggling to find a garment that doesn''t cause tingling. Pt has ordered a custom glove that should be arriving soon. Plan was to D/C today but with new flare up that pt is struggling to control, despite her compliance and efforts, she will benefit from renewing for 1x/wk to continue helping her problem solve with new persistent dorsal hand swelling. Pt goes to beach next week so made renewal for up to 6 weeks but not anticipating need for all visits, but available if her kids get sick again which was another reason she missed some appointments over past few weeks.    Personal Factors and Comorbidities Other    Comorbidities chemo and radiation with cording and fascial tightness after    Examination-Activity Limitations Bathing;Reach Overhead;Caring for Others;Lift;Hygiene/Grooming;Locomotion Level;Sleep    Stability/Clinical Decision Making Evolving/Moderate complexity    Rehab Potential Good    PT Frequency 1x / week    PT Duration 6 weeks   probably will not use all visits   PT Treatment/Interventions ADLs/Self Care Home Management;Therapeutic exercise;Manual techniques;Therapeutic activities;Neuromuscular re-education;Patient/family education;Passive range of motion;Manual lymph drainage;Taping;Compression bandaging;Orthotic Fit/Training    PT Next Visit Plan Renewal this visit to help pt regain control of new onset of dorsal hand swelling; how was new foam at dorsal hand? did new custom flat knit glove arrive? continue assisting pt  with problem  solving for dorsal hand swelling prn and manual lymph drainage of Rt UE focusing on hand during sequence    PT Home Exercise Plan Cont daily self MLD and wear of compression garments; self compression bandage prn    Consulted and Agree with Plan of Care Patient           Patient will benefit from skilled therapeutic intervention in order to improve the following deficits and impairments:  Decreased knowledge of precautions, Decreased knowledge of use of DME, Postural dysfunction, Decreased range of motion, Increased fascial restricitons, Impaired UE functional use, Increased muscle spasms, Impaired perceived functional ability, Pain, Decreased scar mobility, Decreased strength, Increased edema, Difficulty walking, Decreased activity tolerance, Decreased mobility  Visit Diagnosis: Postmastectomy lymphedema  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 lymphedema, right arm 01/15/2020  . LAD (lymphadenopathy), submandibular left 01/15/2020  . Other fatigue 01/15/2020  . Chemotherapy induced neutropenia ([place]) 09/13/2018  . Malignant neoplasm of upper-outer quadrant of right breast in female, estrogen receptor negative ([place]) 05/09/2018  . Iron deficiency 01/16/2018    Otelia Limes, PTA 03/17/2020, 11:47 AM  [place], [place], [address] Phone: [phone]   Fax:  [phone]  Name: Darlene Grant MRN: [id number] Date of Birth: [birth year]

## 2020-03-29 ENCOUNTER — Ambulatory Visit: Payer: BC Managed Care – PPO | Attending: General Surgery

## 2020-03-29 ENCOUNTER — Other Ambulatory Visit: Payer: Self-pay

## 2020-03-29 DIAGNOSIS — L599 Disorder of the skin and subcutaneous tissue related to radiation, unspecified: Secondary | ICD-10-CM | POA: Insufficient documentation

## 2020-03-29 DIAGNOSIS — R293 Abnormal posture: Secondary | ICD-10-CM | POA: Diagnosis not present

## 2020-03-29 DIAGNOSIS — Z483 Aftercare following surgery for neoplasm: Secondary | ICD-10-CM | POA: Insufficient documentation

## 2020-03-29 DIAGNOSIS — I972 Postmastectomy lymphedema syndrome: Secondary | ICD-10-CM

## 2020-03-29 NOTE — Therapy (Signed)
[place], [place], [address] Phone: [phone]   Fax:  [phone]  Physical Therapy Treatment  Patient Details  Name: Darlene Grant MRN: [id number] Date of Birth: [birth year] Referring Provider (PT): Dr. Nicholas Lose; Dr. Clovia Cuff   Encounter Date: 03/29/2020   PT End of Session - 03/29/20 1107    Visit Number 15    Number of Visits 21    Date for PT Re-Evaluation 04/28/20    Authorization Type BCBS    PT Start Time 1004    PT Stop Time 1102    PT Time Calculation (min) 58 min    Activity Tolerance Patient tolerated treatment well    Behavior During Therapy [facility] for tasks assessed/performed           Past Medical History:  Diagnosis Date  . Abnormal Pap smear    ASCUS 03/13/11  . Anemia   . Breast cancer ([place])   . Complication of anesthesia    pt states that all narcotics make her angry and she would prefer to avoid them.  . Grade I hemorrhoids 02/23/2017  . Piriformis syndrome of left side 01/15/2018  . Vaginal Pap smear, abnormal     Past Surgical History:  Procedure Laterality Date  . BREAST BIOPSY Right 8/16  . CESAREAN SECTION N/A 11/21/2013   Procedure: CESAREAN SECTION;  Surgeon: Alwyn Pea, MD;  Location: [facility];  Service: Obstetrics;  Laterality: N/A;  . CESAREAN SECTION N/A 06/23/2016   Procedure: CESAREAN SECTION;  Surgeon: Emily Filbert, MD;  Location: [place] [place];  Service: Obstetrics;  Laterality: N/A;  . HAND SURGERY  04/2012   ligament repair  . MASTECTOMY WITH AXILLARY LYMPH NODE DISSECTION Bilateral 11/14/2018   Procedure: RIGHT MASTECTOMY WITH RIGHT AXILLARY LYMPH NODE DISSECTION AND LEFT RISK REDUCING MASTECTOMY;  Surgeon: Rolm Bookbinder, MD;  Location: [facility];  Service: General;  Laterality: Bilateral;  LATE ENTRY: Corrected laterality documentation to read right axillary lymph node dissection vs. documented laterality of left.     Marland Kitchen PILONIDAL CYST EXCISION  2000  . PORTACATH PLACEMENT N/A 05/28/2018   Procedure: INSERTION PORT-A-CATH WITH ULTRASOUND;  Surgeon: Rolm Bookbinder, MD;  Location: [place];  Service: General;  Laterality: N/A;  . WISDOM TOOTH EXTRACTION  AGE 9    There were no vitals filed for this visit.   Subjective Assessment - 03/29/20 1018    Subjective I bandaged my arm when at the beach and my hand is finally doing better. I feel like when it refills now it's not as bad.    Pertinent History breast cancer diagnosed November 2019: triple negative with Ki of 75% She completed chemotherapy 10/18/2018. 11/14/2018: she underwent bilateral mastectomy with 0/13 nodes positive on the right; radiation ended on 02/10/2019    Patient Stated Goals to not get lymphedema    Currently in Pain? No/denies                 LYMPHEDEMA/ONCOLOGY QUESTIONNAIRE - 03/29/20 0001      Right Upper Extremity Lymphedema   15 cm Proximal to Olecranon Process 26.4 cm    10 cm Proximal to Olecranon Process 26.2 cm    Olecranon Process 23.3 cm    15 cm Proximal to Ulnar Styloid Process 23.5 cm    10 cm Proximal to Ulnar Styloid Process 21.6 cm    Just Proximal to Ulnar Styloid Process 16.7 cm    Across Hand at  Thumb Web Space 19.2 cm    At [place] of 2nd Digit 6.1 cm                      [facility] Adult PT Treatment/Exercise - 03/29/20 0001      Manual Therapy   Edema Management Circumference measurements retaken    Manual Lymphatic Drainage (MLD) in supine for short neck, superficial and deep abdominals, right inguinal nodes and right axillo-inguinal anastamosis, left axillary nodes and anterior interaxillary anastamosis; then focused on Rt UE working from lateral upper arm, medial to lateral, and lateral again, then ant/post forearm and focusing here when retracing steps, then dorsum of hand retracing all steps    Compression Bandaging Thick stockinette, (pt requested no artiflex), placed  lined swell spot from pts Sigvaris longitudinally from MTPs to wrist, then Elastomull to fingers 1-5, 1-6, 1-10 and 1-12 cm short stetch compression bandage from hand to axilla; reviewed hand bandaging technique with pt while applying to remind her of starting with 3 circles over dorsal hand, then also reviewed "X" at elbow technique., she was able to return correct demo of this                    PT Short Term Goals - 03/31/19 1246      PT SHORT TERM GOAL #1   Title independent with initial HEP for left hip    Time 4    Period Weeks    Status Achieved      PT SHORT TERM GOAL #2   Title understand how to take care of vaginal health to reduce dryness and keep tissue mobility    Period Weeks    Status Achieved             PT Long Term Goals - 03/17/20 1132      PT LONG TERM GOAL #1   Title Pt will have reduction in L-Dex score to < 10    Baseline this goal no longer appropriate as pt has Stage 1 lymphedema and L-Dex only measures subclinical lymphedema-03/17/20    Status Deferred      PT LONG TERM GOAL #2   Title Pt will report she is confident in her independence to use self MLD and use of compression to manage her lymphedema    Baseline Pt is feeling more confident with use of compression and is working towards confidence with self MLD (confident with UE sequence, wants to be independent with abdominal sequences as well)-01/13/20' pt had felt independence with bandaging until new flare up of dorsal hand swelling, is indepedent with self MLD-03/17/20    Status Partially Met      PT LONG TERM GOAL #3   Title Pt will report she has returned to gradually progressive resistive exercise for upper body to decrease lymphedema risk    Baseline Pt reports though is working towards this slowly, she does feel confident about how to do this-01/13/20; pt reports independence with exercising at home and feels confident in understanding how to progress "low and slow" with lymphedema-03/17/20     Status Achieved      PT LONG TERM GOAL #4   Title Pt will demonstrate a circumferential reduction of 1 cm at hand across thumb webspace showing she is able to self manage lymphedema symptoms    Baseline 19.7 cm - 03/17/20    Time 6    Period Weeks    Status New  Plan - 03/29/20 1225    Clinical Impression Statement Today pt arrives to clinic in compression bandages she applied and reports did this alot while at beach last week. Her dorsal hand swelling is visibly much improved and her circumference measurements were reduced here as well. She is still struggling with getting correct fitting garments from [place] so inquired if pt wanted second opinion for garments with [employer] and pt was interested in this so with her permission will fax her demographics to [facility] for insurance verification and pt is scheduled this Friday at 1000.    Personal Factors and Comorbidities Other    Comorbidities chemo and radiation with cording and fascial tightness after    Examination-Activity Limitations Bathing;Reach Overhead;Caring for Others;Lift;Hygiene/Grooming;Locomotion Level;Sleep    Stability/Clinical Decision Making Evolving/Moderate complexity    Rehab Potential Good    PT Frequency 1x / week    PT Duration 6 weeks   probably will not need all visits   PT Treatment/Interventions ADLs/Self Care Home Management;Therapeutic exercise;Manual techniques;Therapeutic activities;Neuromuscular re-education;Patient/family education;Passive range of motion;Manual lymph drainage;Taping;Compression bandaging;Orthotic Fit/Training    PT Next Visit Plan How was being measured by fitter from [facility]? How is dorsal hand swelling and managing this?    Recommended Other Services Sent demographics to [facility] for insurance verification           Patient will benefit from skilled therapeutic intervention in order to improve the following deficits and impairments:  Decreased knowledge of  precautions, Decreased knowledge of use of DME, Postural dysfunction, Decreased range of motion, Increased fascial restricitons, Impaired UE functional use, Increased muscle spasms, Impaired perceived functional ability, Pain, Decreased scar mobility, Decreased strength, Increased edema, Difficulty walking, Decreased activity tolerance, Decreased mobility  Visit Diagnosis: Postmastectomy lymphedema  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 lymphedema, right arm 01/15/2020  . LAD (lymphadenopathy), submandibular left 01/15/2020  . Other fatigue 01/15/2020  . Chemotherapy induced neutropenia ([place]) 09/13/2018  . Malignant neoplasm of upper-outer quadrant of right breast in female, estrogen receptor negative ([place]) 05/09/2018  . Iron deficiency 01/16/2018    Otelia Limes, PTA 03/29/2020, 12:44 PM  [place], [place], [address] Phone: [phone]   Fax:  [phone]  Name: Darlene Grant MRN: [id number] Date of Birth: May 04, 1976

## 2020-03-31 ENCOUNTER — Ambulatory Visit: Payer: BC Managed Care – PPO

## 2020-04-06 ENCOUNTER — Other Ambulatory Visit: Payer: Self-pay

## 2020-04-06 ENCOUNTER — Ambulatory Visit (INDEPENDENT_AMBULATORY_CARE_PROVIDER_SITE_OTHER): Payer: BC Managed Care – PPO

## 2020-04-06 ENCOUNTER — Ambulatory Visit: Payer: BC Managed Care – PPO

## 2020-04-06 DIAGNOSIS — Z23 Encounter for immunization: Secondary | ICD-10-CM

## 2020-04-06 NOTE — Progress Notes (Signed)
Pt is here for flu shot. Injection given in left deltoid and tolerated well.

## 2020-04-08 ENCOUNTER — Ambulatory Visit: Payer: BC Managed Care – PPO | Admitting: Radiation Oncology

## 2020-04-12 ENCOUNTER — Ambulatory Visit: Payer: BC Managed Care – PPO

## 2020-04-14 ENCOUNTER — Ambulatory Visit: Payer: BC Managed Care – PPO

## 2020-04-14 ENCOUNTER — Other Ambulatory Visit: Payer: Self-pay

## 2020-04-14 DIAGNOSIS — I972 Postmastectomy lymphedema syndrome: Secondary | ICD-10-CM

## 2020-04-14 DIAGNOSIS — R293 Abnormal posture: Secondary | ICD-10-CM

## 2020-04-14 DIAGNOSIS — L599 Disorder of the skin and subcutaneous tissue related to radiation, unspecified: Secondary | ICD-10-CM | POA: Diagnosis not present

## 2020-04-14 DIAGNOSIS — Z483 Aftercare following surgery for neoplasm: Secondary | ICD-10-CM

## 2020-04-14 NOTE — Therapy (Signed)
[place], [place], [address] Phone: [phone]   Fax:  [phone]  Physical Therapy Treatment  Patient Details  Name: MIN TUNNELL MRN: [id number] Date of Birth: [birth year] Referring Provider (PT): Dr. Nicholas Lose; Dr. Clovia Cuff   Encounter Date: 04/14/2020   PT End of Session - 04/14/20 1058    Visit Number 16    Number of Visits 21    Date for PT Re-Evaluation 04/28/20    Authorization Type BCBS    PT Start Time 1002    PT Stop Time 1055   pt had to leave a few mins early for another appt   PT Time Calculation (min) 53 min    Activity Tolerance Patient tolerated treatment well    Behavior During Therapy [facility] for tasks assessed/performed           Past Medical History:  Diagnosis Date  . Abnormal Pap smear    ASCUS 03/13/11  . Anemia   . Breast cancer ([place])   . Complication of anesthesia    pt states that all narcotics make her angry and she would prefer to avoid them.  . Grade I hemorrhoids 02/23/2017  . Piriformis syndrome of left side 01/15/2018  . Vaginal Pap smear, abnormal     Past Surgical History:  Procedure Laterality Date  . BREAST BIOPSY Right 8/16  . CESAREAN SECTION N/A 11/21/2013   Procedure: CESAREAN SECTION;  Surgeon: Alwyn Pea, MD;  Location: [facility];  Service: Obstetrics;  Laterality: N/A;  . CESAREAN SECTION N/A 06/23/2016   Procedure: CESAREAN SECTION;  Surgeon: Emily Filbert, MD;  Location: [place];  Service: Obstetrics;  Laterality: N/A;  . HAND SURGERY  04/2012   ligament repair  . MASTECTOMY WITH AXILLARY LYMPH NODE DISSECTION Bilateral 11/14/2018   Procedure: RIGHT MASTECTOMY WITH RIGHT AXILLARY LYMPH NODE DISSECTION AND LEFT RISK REDUCING MASTECTOMY;  Surgeon: Rolm Bookbinder, MD;  Location: [facility];  Service: General;  Laterality: Bilateral;  LATE ENTRY: Corrected laterality documentation to read right axillary lymph  node dissection vs. documented laterality of left.    Marland Kitchen PILONIDAL CYST EXCISION  2000  . PORTACATH PLACEMENT N/A 05/28/2018   Procedure: INSERTION PORT-A-CATH WITH ULTRASOUND;  Surgeon: Rolm Bookbinder, MD;  Location: [place];  Service: General;  Laterality: N/A;  . WISDOM TOOTH EXTRACTION  AGE 13    There were no vitals filed for this visit.   Subjective Assessment - 04/14/20 1012    Subjective I've been wearing some of my old circular knit sleeve and gloves that I've had but just haven't worn and put the dorsal pad in my hand and it actually worked great. I'm meeting with the [employer] rep right after this visit today at [place].    Pertinent History breast cancer diagnosed November 2019: triple negative with Ki of 75% She completed chemotherapy 10/18/2018. 11/14/2018: she underwent bilateral mastectomy with 0/13 nodes positive on the right; radiation ended on 02/10/2019    Patient Stated Goals to not get lymphedema    Currently in Pain? No/denies                             [facility] Adult PT Treatment/Exercise - 04/14/20 0001      Manual Therapy   Manual Lymphatic Drainage (MLD) in supine for short neck, superficial and deep abdominals, right inguinal nodes and right axillo-inguinal anastamosis, left axillary  nodes and anterior interaxillary anastamosis; then focused on Rt UE working from lateral upper arm, medial to lateral, and lateral again, then ant/post forearm, then focusing on dorsum of hand reviewing technique with pt and retracing all steps                    PT Short Term Goals - 03/31/19 1246      PT SHORT TERM GOAL #1   Title independent with initial HEP for left hip    Time 4    Period Weeks    Status Achieved      PT SHORT TERM GOAL #2   Title understand how to take care of vaginal health to reduce dryness and keep tissue mobility    Period Weeks    Status Achieved             PT Long Term Goals - 03/17/20 1132       PT LONG TERM GOAL #1   Title Pt will have reduction in L-Dex score to < 10    Baseline this goal no longer appropriate as pt has Stage 1 lymphedema and L-Dex only measures subclinical lymphedema-03/17/20    Status Deferred      PT LONG TERM GOAL #2   Title Pt will report she is confident in her independence to use self MLD and use of compression to manage her lymphedema    Baseline Pt is feeling more confident with use of compression and is working towards confidence with self MLD (confident with UE sequence, wants to be independent with abdominal sequences as well)-01/13/20' pt had felt independence with bandaging until new flare up of dorsal hand swelling, is indepedent with self MLD-03/17/20    Status Partially Met      PT LONG TERM GOAL #3   Title Pt will report she has returned to gradually progressive resistive exercise for upper body to decrease lymphedema risk    Baseline Pt reports though is working towards this slowly, she does feel confident about how to do this-01/13/20; pt reports independence with exercising at home and feels confident in understanding how to progress "low and slow" with lymphedema-03/17/20    Status Achieved      PT LONG TERM GOAL #4   Title Pt will demonstrate a circumferential reduction of 1 cm at hand across thumb webspace showing she is able to self manage lymphedema symptoms    Baseline 19.7 cm - 03/17/20    Time 6    Period Weeks    Status New                 Plan - 04/14/20 1058    Clinical Impression Statement Pt is starting to feel like she is finding some solutions for compression sleeve and glove for her Rt UE. Her circular knit sleeve and glove seemed to work well with dorsal pad at hand. She was also given an elvarex glove to try which seemed to work great. She meets with a [employer] rep at [place] after session today to be measured and discuss garment options. Using [employer] won't work because of insurance. Pt is pleased with option of  meeting with rep and feels she will be ready for D/C at next session as she is now feeling more competent with her self MLD. Rt dorsal hand swelling was mcuh improved today.    Personal Factors and Comorbidities Other    Comorbidities chemo and radiation with cording and fascial tightness after  Examination-Activity Limitations Bathing;Reach Overhead;Caring for Others;Lift;Hygiene/Grooming;Locomotion Level;Sleep    Stability/Clinical Decision Making Evolving/Moderate complexity    Rehab Potential Good    PT Frequency 1x / week    PT Duration 6 weeks   probably will not need all visits   PT Treatment/Interventions ADLs/Self Care Home Management;Therapeutic exercise;Manual techniques;Therapeutic activities;Neuromuscular re-education;Patient/family education;Passive range of motion;Manual lymph drainage;Taping;Compression bandaging;Orthotic Fit/Training    PT Next Visit Plan How was meeting with [employer] rep? D/C next    PT Home Exercise Plan Cont daily self MLD and wear of compression garments; self compression bandage prn    Consulted and Agree with Plan of Care Patient           Patient will benefit from skilled therapeutic intervention in order to improve the following deficits and impairments:  Decreased knowledge of precautions, Decreased knowledge of use of DME, Postural dysfunction, Decreased range of motion, Increased fascial restricitons, Impaired UE functional use, Increased muscle spasms, Impaired perceived functional ability, Pain, Decreased scar mobility, Decreased strength, Increased edema, Difficulty walking, Decreased activity tolerance, Decreased mobility  Visit Diagnosis: Postmastectomy lymphedema  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 lymphedema, right arm 01/15/2020  . LAD (lymphadenopathy),  submandibular left 01/15/2020  . Other fatigue 01/15/2020  . Chemotherapy induced neutropenia ([place]) 09/13/2018  . Malignant neoplasm of upper-outer quadrant of right breast in female, estrogen receptor negative ([place]) 05/09/2018  . Iron deficiency 01/16/2018    Otelia Limes, PTA 04/14/2020, 11:04 AM  [place], [place], [address] Phone: [phone]   Fax:  [phone]  Name: ROTHA CASSELS MRN: [id number] Date of Birth: 1976/03/20

## 2020-04-20 ENCOUNTER — Telehealth (INDEPENDENT_AMBULATORY_CARE_PROVIDER_SITE_OTHER): Payer: BC Managed Care – PPO | Admitting: Family Medicine

## 2020-04-20 ENCOUNTER — Encounter: Payer: Self-pay | Admitting: Family Medicine

## 2020-04-20 DIAGNOSIS — D701 Agranulocytosis secondary to cancer chemotherapy: Secondary | ICD-10-CM | POA: Diagnosis not present

## 2020-04-20 DIAGNOSIS — C50411 Malignant neoplasm of upper-outer quadrant of right female breast: Secondary | ICD-10-CM | POA: Diagnosis not present

## 2020-04-20 DIAGNOSIS — R5383 Other fatigue: Secondary | ICD-10-CM

## 2020-04-20 DIAGNOSIS — Z171 Estrogen receptor negative status [ER-]: Secondary | ICD-10-CM

## 2020-04-20 DIAGNOSIS — T451X5A Adverse effect of antineoplastic and immunosuppressive drugs, initial encounter: Secondary | ICD-10-CM

## 2020-04-20 NOTE — Progress Notes (Signed)
Virtual Visit via Video   I connected with patient on 04/20/20 at 10:00 AM EDT by a video enabled telemedicine application and verified that I am speaking with the correct person using two identifiers.  Location patient: Home Location provider: [facility], Office Persons participating in the virtual visit: Patient, Provider, [place] (Sabrina M)  I discussed the limitations of evaluation and management by telemedicine and the availability of in person appointments. The patient expressed understanding and agreed to proceed.  Subjective:   HPI:   New to establish.  Previous PCP- Raoul Pitch.  Breast cancer- R sided, s/p bilateral mastectomy w/ axillary dissection, chemo, radiation.  Following w/ oncology q3 months (Dr Lindi Adie).  Fatigue- pt reports she had fatigue prior to being dx'd w/ cancer.  She has 2 young children, her dad is ill.  Late Sept had CBC done.  In late July had normal thyroid, metabolic panel.  No recent B12 or folate.  Pt reports increased irritability.  Has more stress than usual.  Is trying to exercise more regularly.  Pt is not interested in mood medication at this time.  L hip pain- pain started once spinal wore off when daughter was born nearly 4 yrs ago.  Pt had some PT for the hip but continues to have pain.  Feels it is muscular tightness.  Would like a PT recommendation but not interested at this time due to multiple appts.  ROS:   See pertinent positives and negatives per HPI.  Patient Active Problem List   Diagnosis Date Noted  . lymphedema, right arm 01/15/2020  . LAD (lymphadenopathy), submandibular left 01/15/2020  . Other fatigue 01/15/2020  . Chemotherapy induced neutropenia ([place]) 09/13/2018  . Malignant neoplasm of upper-outer quadrant of right breast in female, estrogen receptor negative ([place]) 05/09/2018  . Iron deficiency 01/16/2018    Social History   Tobacco Use  . Smoking status: Never Smoker  . Smokeless tobacco: Never Used  Substance  Use Topics  . Alcohol use: Yes    Alcohol/week: 0.0 standard drinks    Comment: occassionally    Current Outpatient Medications:  .  B Complex-C (B-COMPLEX WITH VITAMIN C) tablet, Take 1 tablet by mouth daily as needed. , Disp: , Rfl:  .  Calcium Acetate-Magnesium Carb 450-200 MG TABS, Take by mouth., Disp: , Rfl:  .  Cholecalciferol (VITAMIN D) 125 MCG (5000 UT) CAPS, Take 5,000 Units by mouth daily., Disp: 30 capsule, Rfl:  .  cyclobenzaprine (FLEXERIL) 10 MG tablet, Take 1 tablet (10 mg total) by mouth as needed for muscle spasms. 1/2 to I tab TID, Disp: 30 tablet, Rfl: 2 .  fluticasone (FLONASE) 50 MCG/ACT nasal spray, Place 2 sprays into both nostrils daily., Disp: 16 g, Rfl: 2 .  hydrocortisone 2.5 % cream, Apply topically as needed., Disp: 30 g, Rfl: 2 .  loratadine (CLARITIN) 10 MG tablet, Take 10 mg by mouth daily as needed for allergies., Disp: , Rfl:  .  Multiple Vitamins-Minerals (MULTIVITAMIN WITH MINERALS) tablet, Take 1 tablet by mouth daily., Disp: , Rfl:  .  Probiotic Product (PROBIOTIC DAILY PO), Take 1 tablet by mouth., Disp: , Rfl:  .  Turmeric 500 MG CAPS, Take 1 capsule by mouth daily., Disp: , Rfl:   Allergies  Allergen Reactions  . Augmentin [Amoxicillin-Pot Clavulanate]     diarrhea  . Hydrocodone Itching  . Keflex [Cephalexin] Diarrhea    Objective:   There were no vitals taken for this visit. AAOx3, NAD NCAT, EOMI No obvious CN  deficits Coloring WNL Pt is able to speak clearly, coherently without shortness of breath or increased work of breathing.  Thought process is linear.  Mood is appropriate.  Pt is very distracted during our visit today b/c both children are in the room  Assessment and Plan:   Fatigue- new to provider, ongoing for pt.  She reports she had fatigue prior to her breast cancer dx and subsequent treatments.  She is increasingly irritable and admits to being overwhelmed w/ parenting at times.  Discussed that stress can manifest itself  as fatigue and discussed mood tx.  She is not interested in medication at this time.  Check labs to assess for possible metabolic cause.  Will follow.  Breast cancer- following w/ Dr Lindi Adie.  Has hx of neutropenia due to chemo.  Will defer to his expertise as to whether her mildly low WBC is cause for concern.   Annye Asa, MD 04/20/2020

## 2020-04-21 ENCOUNTER — Telehealth: Payer: Self-pay | Admitting: Family Medicine

## 2020-04-21 NOTE — Telephone Encounter (Signed)
I have LM asking pt to call back to schedule a lab only visit

## 2020-04-22 ENCOUNTER — Telehealth: Payer: Self-pay

## 2020-04-22 NOTE — Telephone Encounter (Signed)
Pt had to cancel her appointment this week due to a sick child. That was to be her last scheduled visit so called her and spoke with her. She reports is overall doing well and will continue to pursue compression garment options with the rep from the [employer]. She also feels independent with managing her lymphedema symptoms at this time so would like to cancel her future appointments. She also verbalized wanting to pursue a compression pump so, with her permission, faxed her information over to [employer] today to look into a Flexitouch.

## 2020-04-27 ENCOUNTER — Ambulatory Visit: Payer: BC Managed Care – PPO

## 2020-04-27 DIAGNOSIS — I972 Postmastectomy lymphedema syndrome: Secondary | ICD-10-CM | POA: Diagnosis not present

## 2020-05-07 ENCOUNTER — Ambulatory Visit (INDEPENDENT_AMBULATORY_CARE_PROVIDER_SITE_OTHER): Payer: BC Managed Care – PPO

## 2020-05-07 ENCOUNTER — Other Ambulatory Visit: Payer: Self-pay

## 2020-05-07 DIAGNOSIS — T451X5A Adverse effect of antineoplastic and immunosuppressive drugs, initial encounter: Secondary | ICD-10-CM | POA: Diagnosis not present

## 2020-05-07 DIAGNOSIS — D701 Agranulocytosis secondary to cancer chemotherapy: Secondary | ICD-10-CM | POA: Diagnosis not present

## 2020-05-07 DIAGNOSIS — R5383 Other fatigue: Secondary | ICD-10-CM

## 2020-05-07 LAB — TSH: TSH: 0.56 u[IU]/mL (ref 0.35–4.50)

## 2020-05-07 LAB — CBC WITH DIFFERENTIAL/PLATELET
Basophils Absolute: 0 10*3/uL (ref 0.0–0.1)
Basophils Relative: 0.8 % (ref 0.0–3.0)
Eosinophils Absolute: 0 10*3/uL (ref 0.0–0.7)
Eosinophils Relative: 0.9 % (ref 0.0–5.0)
HCT: 34.9 % — ABNORMAL LOW (ref 36.0–46.0)
Hemoglobin: 11.9 g/dL — ABNORMAL LOW (ref 12.0–15.0)
Lymphocytes Relative: 23.9 % (ref 12.0–46.0)
Lymphs Abs: 0.9 10*3/uL (ref 0.7–4.0)
MCHC: 34.1 g/dL (ref 30.0–36.0)
MCV: 96.2 fl (ref 78.0–100.0)
Monocytes Absolute: 0.3 10*3/uL (ref 0.1–1.0)
Monocytes Relative: 7.8 % (ref 3.0–12.0)
Neutro Abs: 2.5 10*3/uL (ref 1.4–7.7)
Neutrophils Relative %: 66.6 % (ref 43.0–77.0)
Platelets: 227 10*3/uL (ref 150.0–400.0)
RBC: 3.63 Mil/uL — ABNORMAL LOW (ref 3.87–5.11)
RDW: 13.1 % (ref 11.5–15.5)
WBC: 3.7 10*3/uL — ABNORMAL LOW (ref 4.0–10.5)

## 2020-05-07 LAB — HEPATIC FUNCTION PANEL
ALT: 8 U/L (ref 0–35)
AST: 11 U/L (ref 0–37)
Albumin: 4.4 g/dL (ref 3.5–5.2)
Alkaline Phosphatase: 54 U/L (ref 39–117)
Bilirubin, Direct: 0.2 mg/dL (ref 0.0–0.3)
Total Bilirubin: 1.4 mg/dL — ABNORMAL HIGH (ref 0.2–1.2)
Total Protein: 6.4 g/dL (ref 6.0–8.3)

## 2020-05-07 LAB — BASIC METABOLIC PANEL
BUN: 12 mg/dL (ref 6–23)
CO2: 31 mEq/L (ref 19–32)
Calcium: 9.3 mg/dL (ref 8.4–10.5)
Chloride: 103 mEq/L (ref 96–112)
Creatinine, Ser: 0.62 mg/dL (ref 0.40–1.20)
GFR: 109.85 mL/min (ref >60.00)
Glucose, Bld: 87 mg/dL (ref 70–99)
Potassium: 3.9 mEq/L (ref 3.5–5.1)
Sodium: 140 mEq/L (ref 135–145)

## 2020-05-07 LAB — B12 AND FOLATE PANEL
Folate: >23.6 ng/mL (ref >5.9)
Vitamin B-12: 307 pg/mL (ref 211–911)

## 2020-05-07 LAB — T3, FREE: T3, Free: 2.6 pg/mL (ref 2.3–4.2)

## 2020-05-07 LAB — T4, FREE: Free T4: 0.78 ng/dL (ref 0.60–1.60)

## 2020-05-18 IMAGING — CR DG FEMUR 2+V*R*
4 series · 4 of 4 positions shown · non-contrast
Comparison: None.

CLINICAL DATA: Right leg pain and abnormal bone scan

EXAM:
RIGHT FEMUR 2 VIEWS

[t femur with hip  ap right]
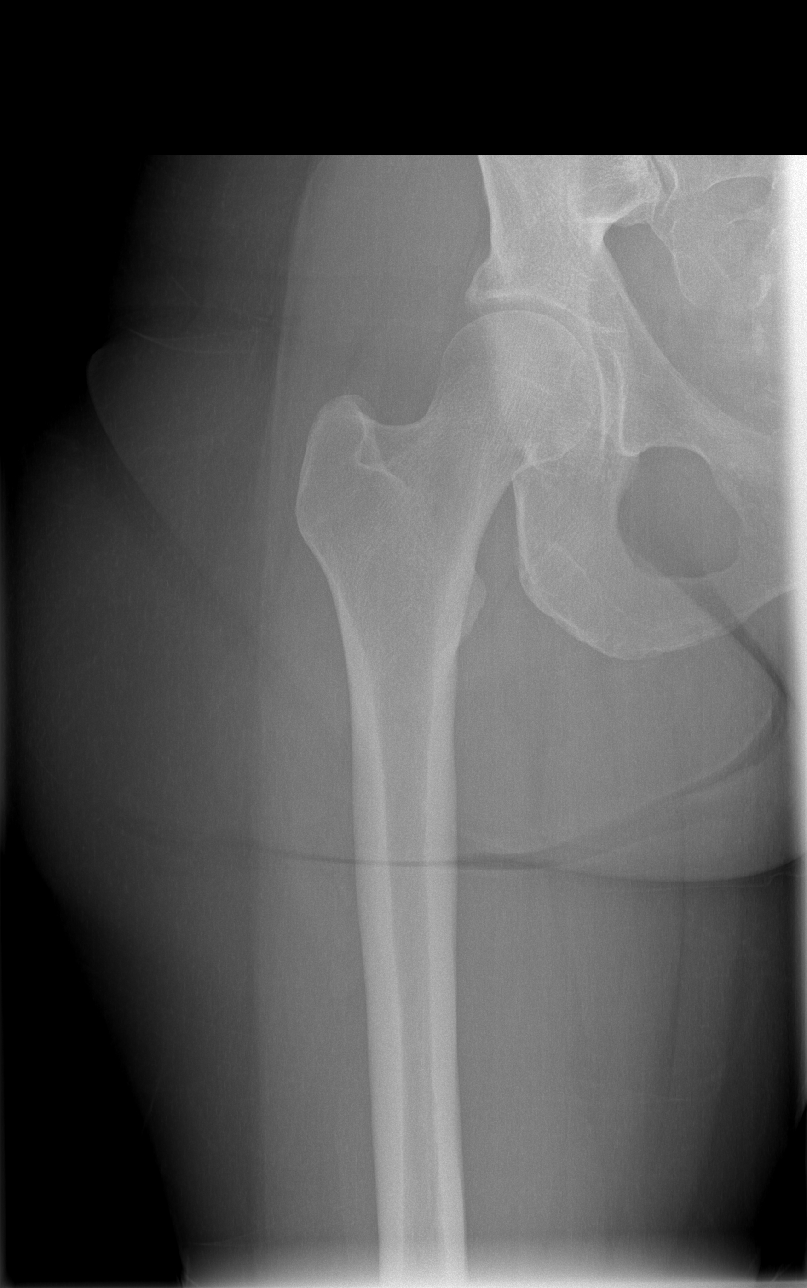

[t femur with knee ap right]
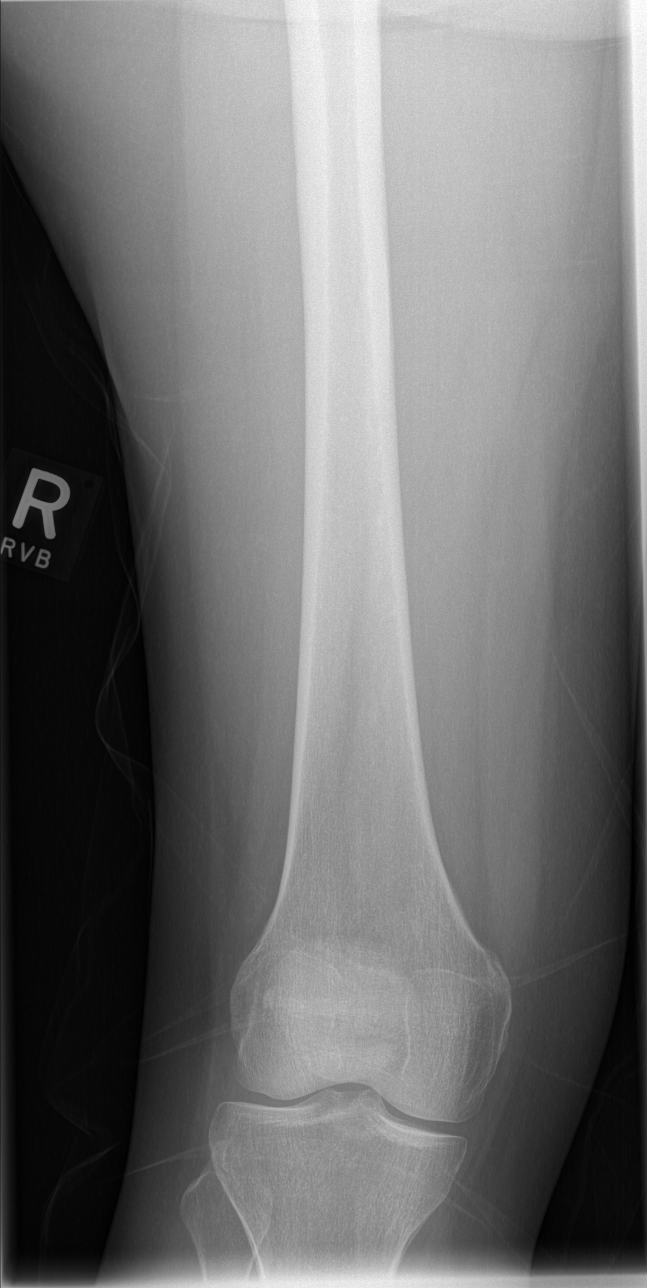

[t femur with hip lat right]
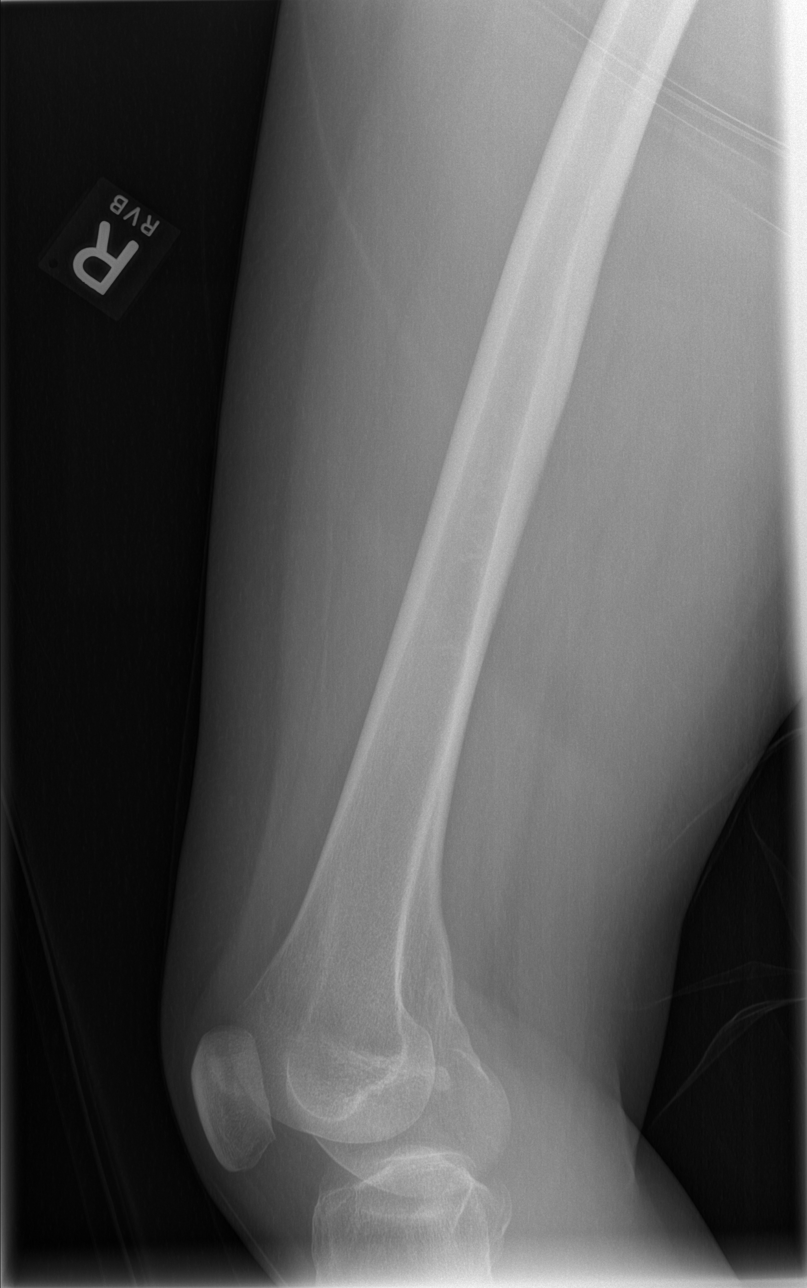

[t femur with knee lat right]
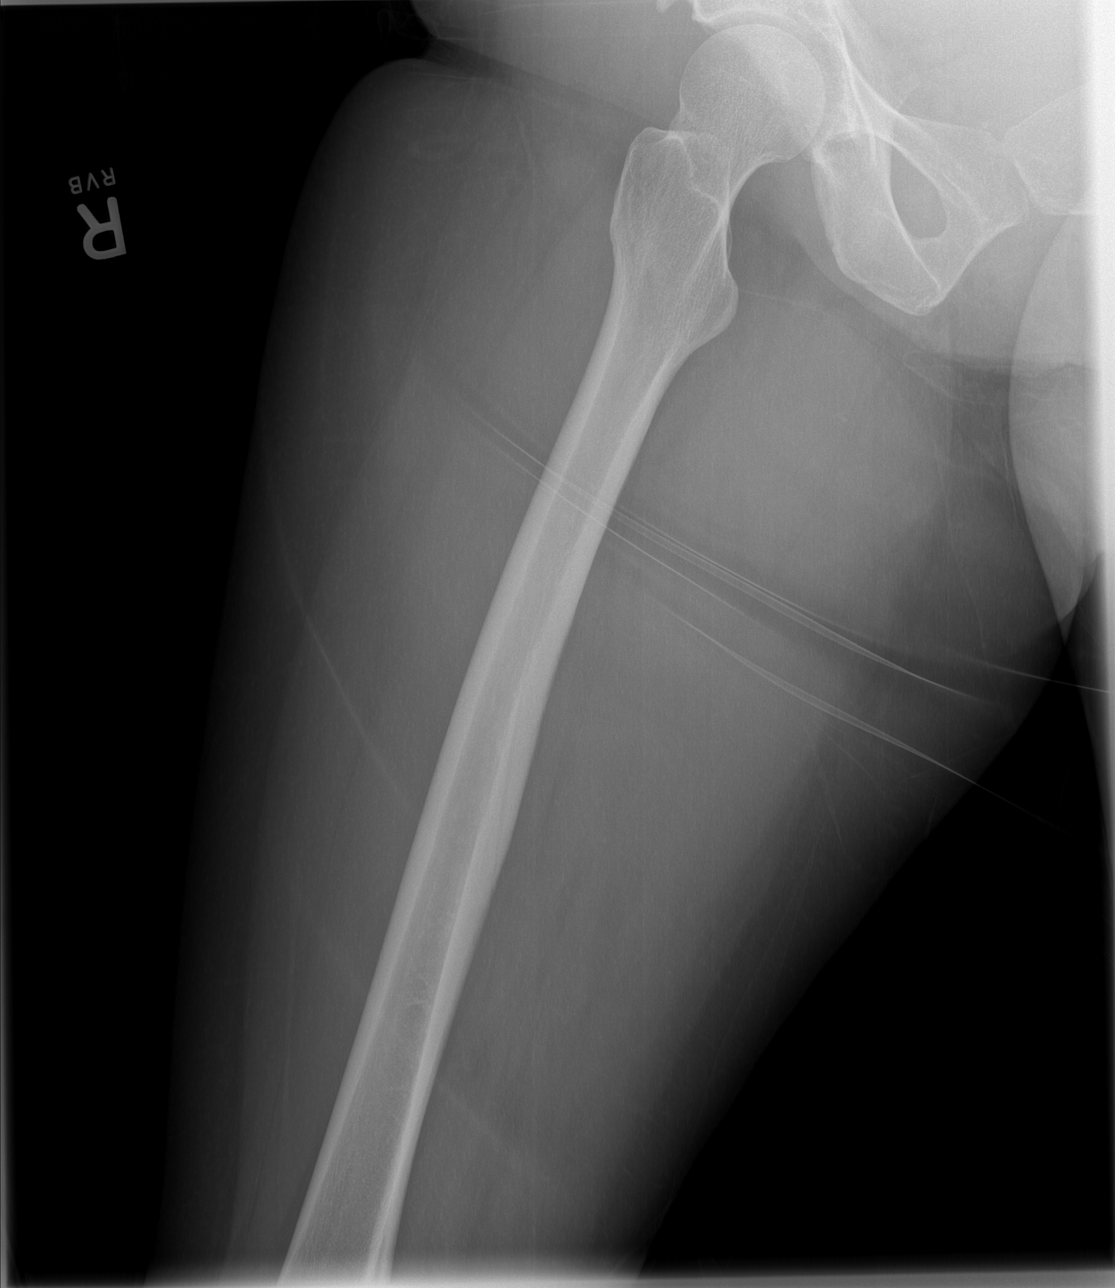

[4 of 4 positions shown; findings below may reference images not displayed]

FINDINGS: No acute fracture or dislocation is noted. No gross soft tissue
abnormality is noted. No definitive lytic or sclerotic lesion is
identified to correspond with that seen on recent bone scan.
IMPRESSION: No acute abnormality noted. No focal abnormality to correspond with
findings of recent bone scan.

## 2020-05-28 ENCOUNTER — Encounter: Payer: Self-pay | Admitting: Family Medicine

## 2020-05-28 ENCOUNTER — Other Ambulatory Visit: Payer: Self-pay

## 2020-05-28 ENCOUNTER — Telehealth (INDEPENDENT_AMBULATORY_CARE_PROVIDER_SITE_OTHER): Payer: BC Managed Care – PPO | Admitting: Family Medicine

## 2020-05-28 VITALS — BP 122/70 | Ht 64.5 in | Wt 140.0 lb

## 2020-05-28 DIAGNOSIS — J0140 Acute pansinusitis, unspecified: Secondary | ICD-10-CM | POA: Diagnosis not present

## 2020-05-28 DIAGNOSIS — C50411 Malignant neoplasm of upper-outer quadrant of right female breast: Secondary | ICD-10-CM | POA: Diagnosis not present

## 2020-05-28 MED ORDER — AZITHROMYCIN 250 MG PO TABS: ORAL_TABLET | ORAL | 0 refills | Status: DC

## 2020-05-28 MED ORDER — FLUTICASONE PROPIONATE 50 MCG/ACT NA SUSP: 2.0000 | Freq: Every day | NASAL | 6 refills | Status: DC

## 2020-05-28 NOTE — Progress Notes (Signed)
Virtual Visit via Video Note  I connected with Darlene Grant on 05/28/20 at  3:00 PM EST by a video enabled telemedicine application and verified that I am speaking with the correct person using two identifiers.  Location: Patient: home alone  Provider: home    I discussed the limitations of evaluation and management by telemedicine and the availability of in person appointments. The patient expressed understanding and agreed to proceed.  History of Present Illness: Pt is home c/o 1 week of congestion ,   + sneezing and + coughing, productive-- yellow / green  No fever  Pt is taking sudafed and robitussin max cough syrup,  + tylenol--- sudafed normally helps a lot    observations/Objective: Vitals:   05/28/20 1504  BP: 122/70  pt is in NAD  pt has sinus pressure/ headache  No sob with speaking   Assessment and Plan: 1. Acute non-recurrent pansinusitis Pt has had z pak before for sinus with good results Use flonase  She will get a covid test as well  - azithromycin (ZITHROMAX Z-PAK) 250 MG tablet; As directed  Dispense: 6 each; Refill: 0 - fluticasone (FLONASE) 50 MCG/ACT nasal spray; Place 2 sprays into both nostrils daily.  Dispense: 16 g; Refill: 6   Follow Up Instructions:    I discussed the assessment and treatment plan with the patient. The patient was provided an opportunity to ask questions and all were answered. The patient agreed with the plan and demonstrated an understanding of the instructions.   The patient was advised to call back or seek an in-person evaluation if the symptoms worsen or if the condition fails to improve as anticipated.  I provided 25 minutes of non-face-to-face time during this encounter.   Ann Held, DO

## 2020-05-31 ENCOUNTER — Other Ambulatory Visit: Payer: Self-pay | Admitting: Family Medicine

## 2020-05-31 DIAGNOSIS — R059 Cough, unspecified: Secondary | ICD-10-CM

## 2020-05-31 MED ORDER — PROMETHAZINE-DM 6.25-15 MG/5ML PO SYRP
5.0000 mL | ORAL_SOLUTION | Freq: Four times a day (QID) | ORAL | 0 refills | Status: DC | PRN
Start: 2020-05-31 — End: 2020-08-30

## 2020-05-31 NOTE — Telephone Encounter (Signed)
I have sent phenergan dm to the pharmacy for you.  Hope you are starting to feel better.  Dr Kennieth Rad

## 2020-06-02 ENCOUNTER — Encounter: Payer: Self-pay | Admitting: Family Medicine

## 2020-06-14 ENCOUNTER — Encounter: Payer: Self-pay | Admitting: Hematology and Oncology

## 2020-06-15 DIAGNOSIS — Z9012 Acquired absence of left breast and nipple: Secondary | ICD-10-CM | POA: Diagnosis not present

## 2020-06-15 DIAGNOSIS — C50911 Malignant neoplasm of unspecified site of right female breast: Secondary | ICD-10-CM | POA: Diagnosis not present

## 2020-06-16 DIAGNOSIS — C50911 Malignant neoplasm of unspecified site of right female breast: Secondary | ICD-10-CM | POA: Diagnosis not present

## 2020-06-30 ENCOUNTER — Encounter: Payer: Self-pay | Admitting: Hematology and Oncology

## 2020-06-30 DIAGNOSIS — R21 Rash and other nonspecific skin eruption: Secondary | ICD-10-CM | POA: Diagnosis not present

## 2020-06-30 DIAGNOSIS — C50411 Malignant neoplasm of upper-outer quadrant of right female breast: Secondary | ICD-10-CM

## 2020-06-30 DIAGNOSIS — Z171 Estrogen receptor negative status [ER-]: Secondary | ICD-10-CM

## 2020-07-15 ENCOUNTER — Encounter: Payer: Self-pay | Admitting: Physical Therapy

## 2020-07-16 IMAGING — US ULTRASOUND RIGHT BREAST LIMITED
1 series · 13 of 14 positions shown · non-contrast
Comparison: 05/28/2018 and earlier

CLINICAL DATA: Patient returns for evaluation of the RIGHT breast.
The patient is undergoing neoadjuvant chemotherapy for treatment of
RIGHT breast cancer. The area of concern is a palpable mass in the
UPPER OUTER QUADRANT of the RIGHT breast originally measuring 3.7 x
1.7 x 1.5 centimeters in the 10-11 o'clock location.

EXAM:
ULTRASOUND OF THE RIGHT BREAST

[Series 1: ultrasound right breast limited · 0.06mm/px · 13 of 14 slices shown]
[im 1/14]
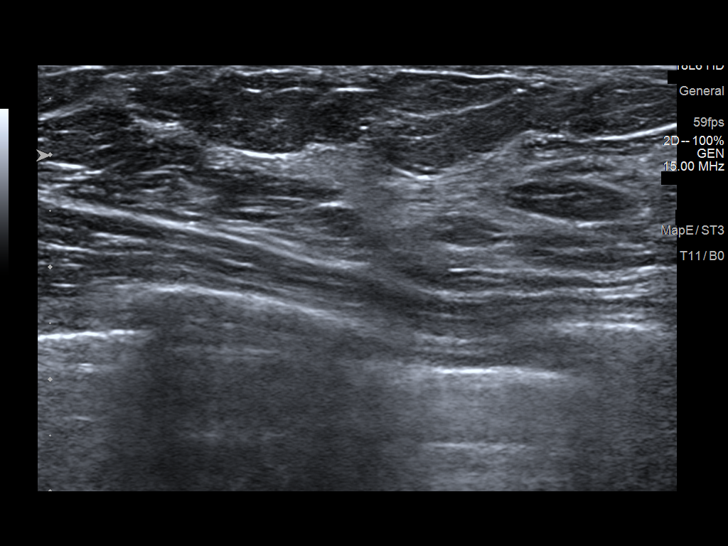
[im 2/14]
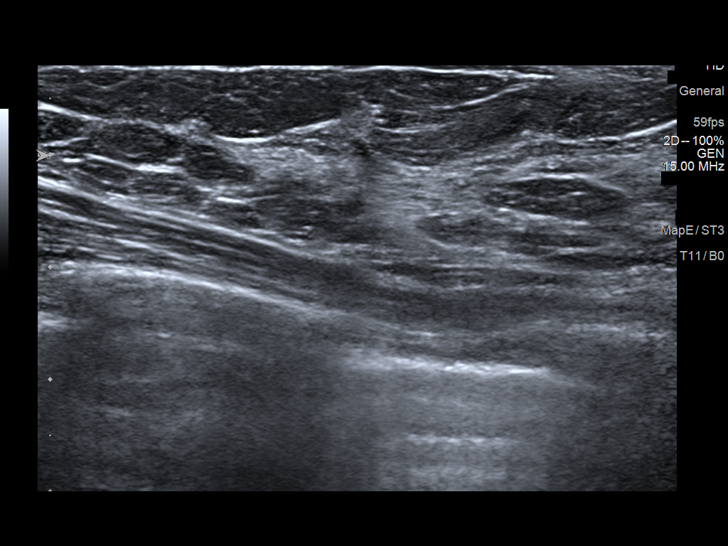
[im 3/14]
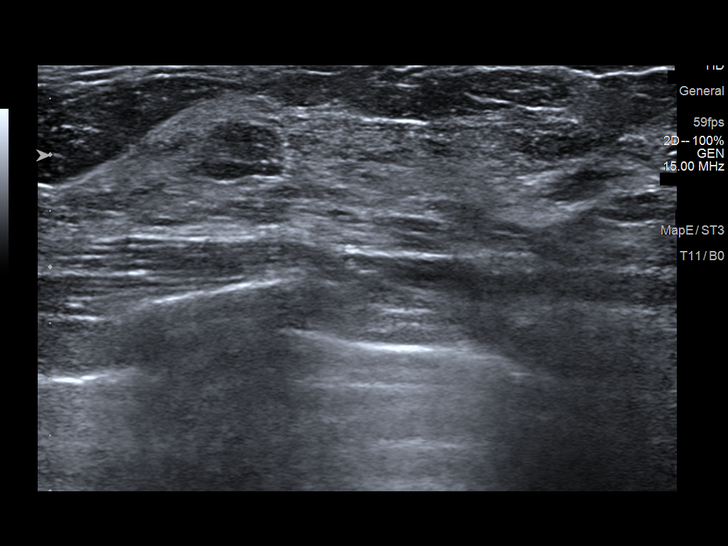
[im 4/14]
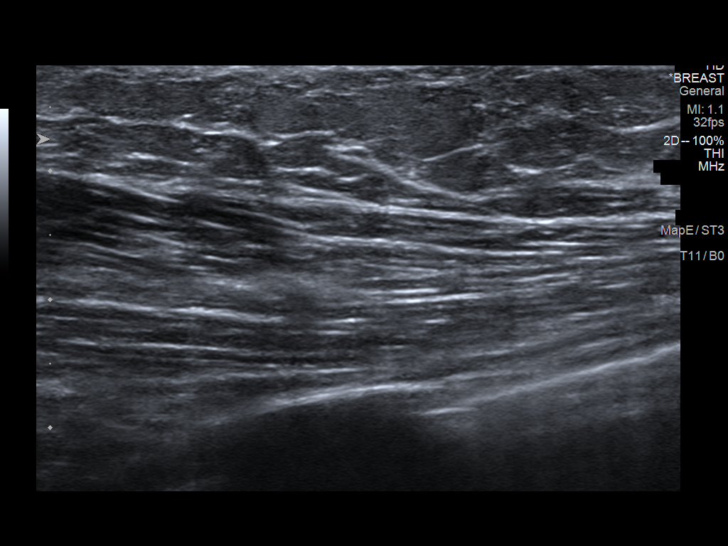
[im 5/14]
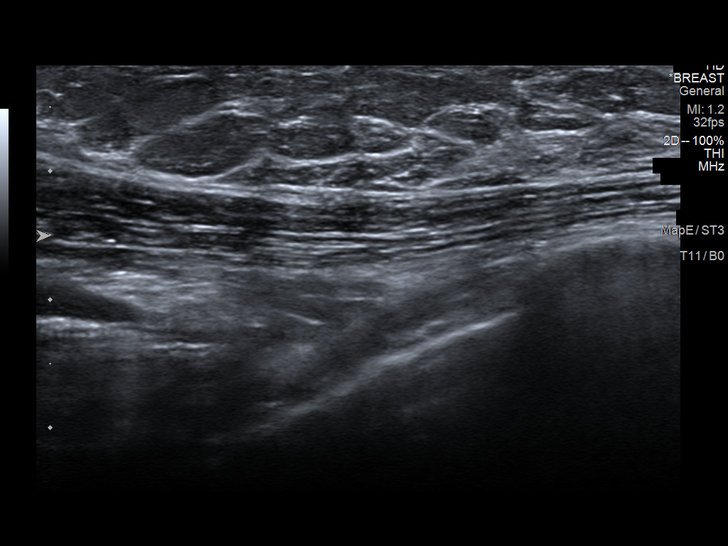
[im 6/14]
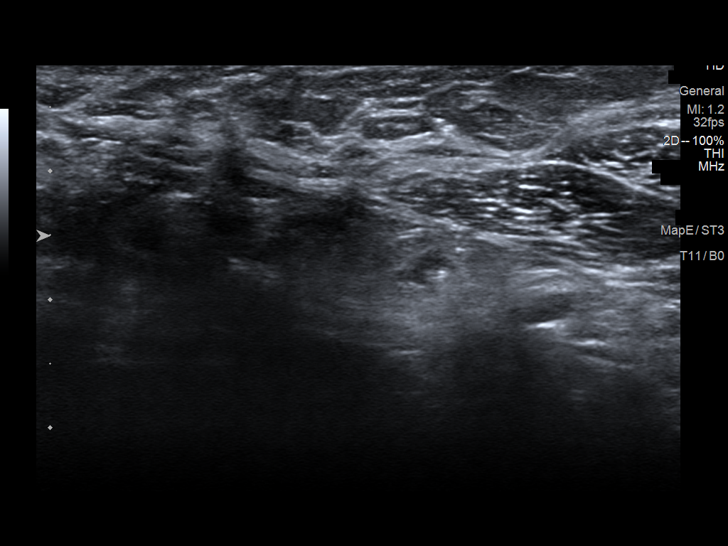
[im 8/14]
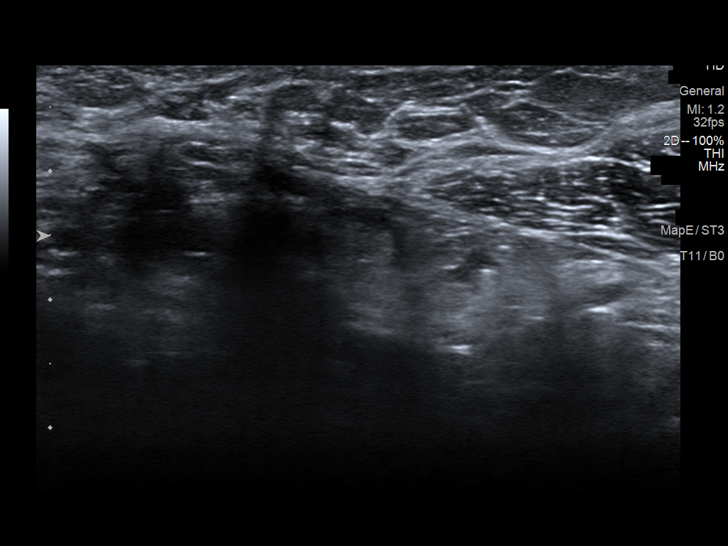
[im 9/14]
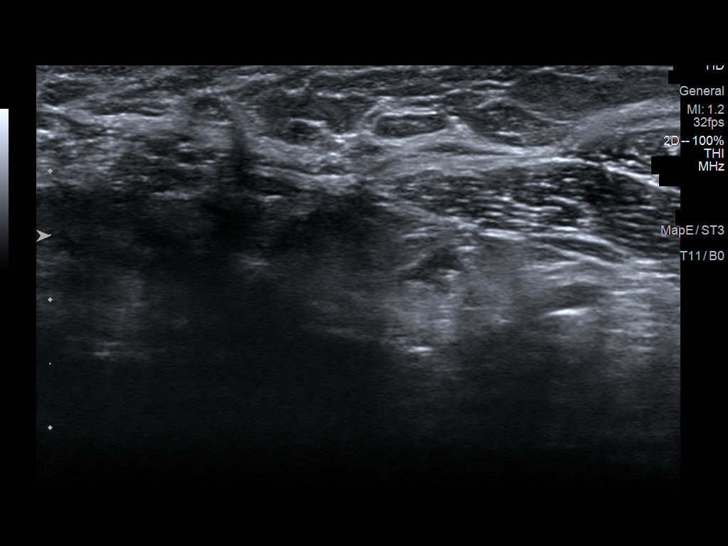
[im 10/14]
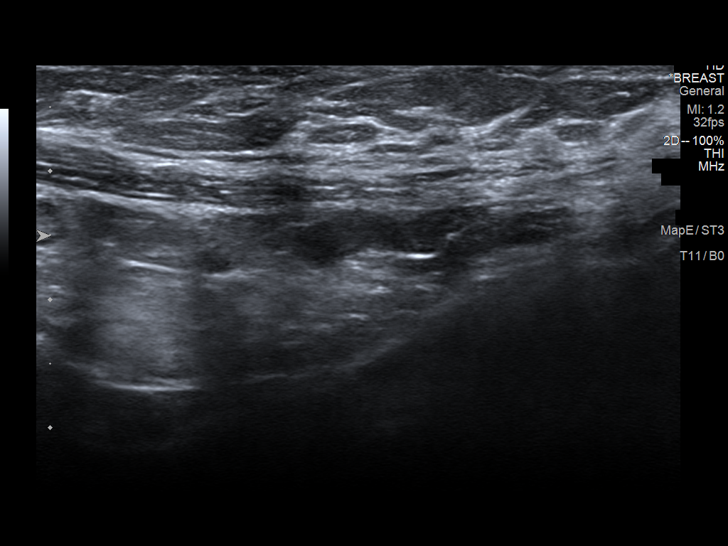
[im 11/14]
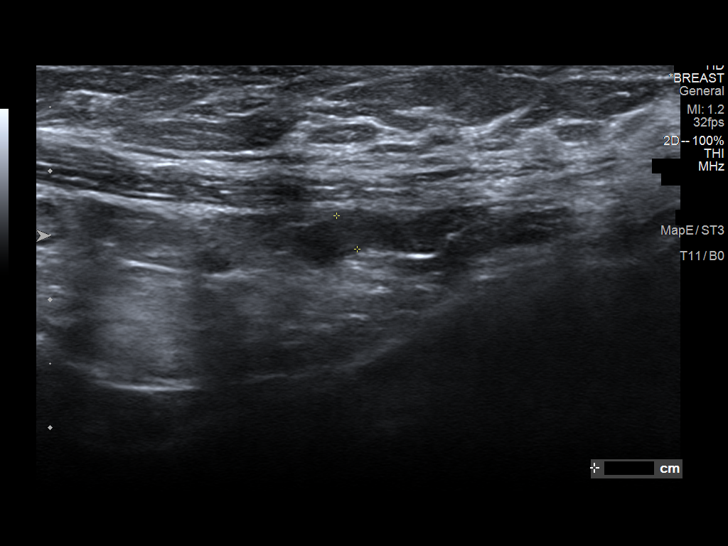
[im 12/14]
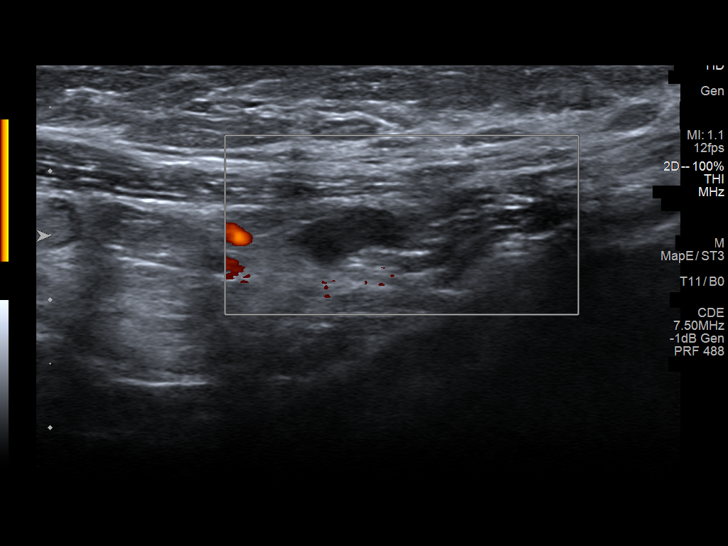
[im 13/14]
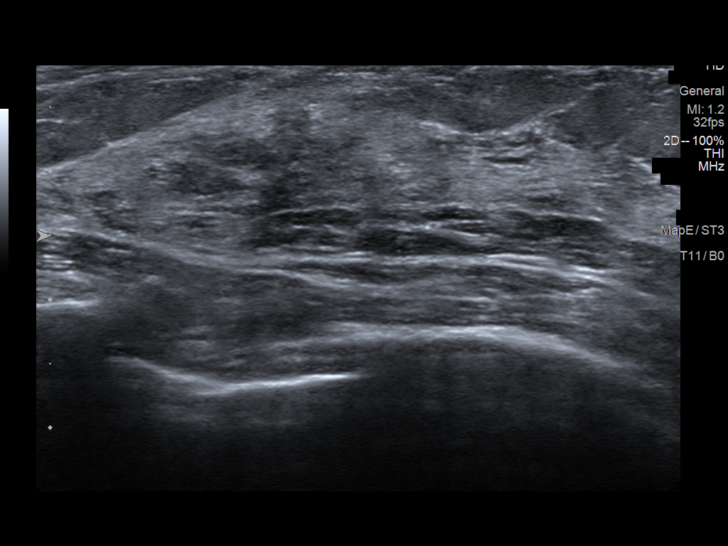
[im 14/14]
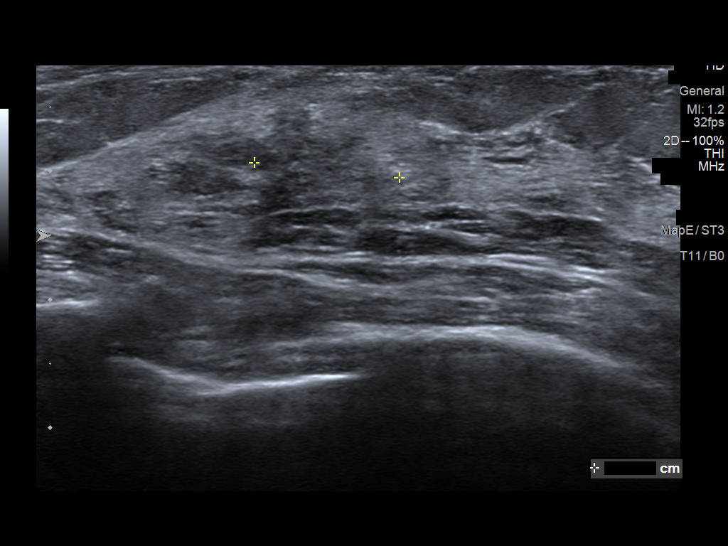

[13 of 14 positions shown; findings below may reference images not displayed]

FINDINGS: On physical exam, I palpate no abnormality in the UPPER portion of
the RIGHT breast.

Targeted ultrasound is performed, showing vague area of acoustic
shadowing/distortion in the 11 o'clock location of the RIGHT breast
3 centimeters from the nipple, measuring 1.4 centimeters. No
discrete mass is identified however. Evaluation of the RIGHT axilla
shows lymph nodes with normal morphology. The most abnormal lymph
node has a cortical thickening of 3 millimeters.
IMPRESSION: Significant improvement in the appearance of RIGHT axillary
adenopathy.

Mass in the 11 o'clock is barely detectable sonographically and is
no longer palpable.

RECOMMENDATION:
Treatment plan for known malignancy.

I have discussed the findings and recommendations with the patient.
Results were also provided in writing at the conclusion of the
visit. If applicable, a reminder letter will be sent to the patient
regarding the next appointment.

BI-RADS CATEGORY  6: Known biopsy-proven malignancy.

## 2020-08-15 ENCOUNTER — Encounter: Payer: Self-pay | Admitting: Family Medicine

## 2020-08-16 MED ORDER — FLUTICASONE PROPIONATE 50 MCG/ACT NA SUSP: 2.0000 | Freq: Every day | NASAL | 1 refills | Status: DC

## 2020-08-24 ENCOUNTER — Ambulatory Visit: Payer: BC Managed Care – PPO | Admitting: Hematology and Oncology

## 2020-08-29 NOTE — Progress Notes (Signed)
Patient Care Team: Midge Minium, MD as PCP - General (Family Medicine) Rolm Bookbinder, MD as Consulting Physician (General Surgery) Gery Pray, MD as Consulting Physician (Radiation Oncology) Emily Filbert, MD (Inactive) as Consulting Physician (Obstetrics and Gynecology) Lavonna Monarch, MD as Consulting Physician (Dermatology) Mauro Kaufmann, RN as Oncology Nurse Navigator Rockwell Germany, RN as Oncology Nurse Navigator [place], Amy V, OD (Optometry) Nicholas Lose, MD as Consulting Physician (Hematology and Oncology)  DIAGNOSIS:    ICD-10-CM   1. Malignant neoplasm of upper-outer quadrant of right breast in female, estrogen receptor negative ([place])  C50.411    Z17.1     SUMMARY OF ONCOLOGIC HISTORY: Oncology History  Malignant neoplasm of upper-outer quadrant of right breast in female, estrogen receptor negative ([place])  05/03/2018 Initial Diagnosis   right breast upper outer quadrant and right axillary lymph node biopsy 05/03/2018 for a clinical T2 N2, stage IIIC invasive ductal carcinoma, triple negative, with an Ki-67 of 75%   06/03/2018 - 10/18/2018 Neo-Adjuvant Chemotherapy   Dose dense Adriamycin and Cytoxan followed by Taxol and carboplatin x12   11/14/2018 Surgery   Bilateral mastectomies: Left mastectomy: Fibrocystic changes, right mastectomy: Complete pathologic response no evidence of residual cancer, 0/13 lymph nodes negative   12/31/2018 - 02/10/2019 Radiation Therapy   Adjuvant XRT     CHIEF COMPLIANT: Follow-up of triple negative right breast cancer  INTERVAL HISTORY: Darlene Grant is a 45 y.o. with above-mentioned history of triple negative right breast cancertreated with neoadjuvant chemotherapy, bilateral mastectomies, radiation, and who is currently on surveillance.She presents to the clinic today for follow-up.  Right arm lymphedema.  ALLERGIES:  is allergic to augmentin [amoxicillin-pot clavulanate], hydrocodone, and keflex  [cephalexin].  MEDICATIONS:  Current Outpatient Medications  Medication Sig Dispense Refill   azithromycin (ZITHROMAX Z-PAK) 250 MG tablet As directed 6 each 0   B Complex-C (B-COMPLEX WITH VITAMIN C) tablet Take 1 tablet by mouth daily as needed.      Calcium Acetate-Magnesium Carb 450-200 MG TABS Take by mouth.     Cholecalciferol (VITAMIN D) 125 MCG (5000 UT) CAPS Take 5,000 Units by mouth daily. 30 capsule    cyclobenzaprine (FLEXERIL) 10 MG tablet Take 1 tablet (10 mg total) by mouth as needed for muscle spasms. 1/2 to I tab TID 30 tablet 2   fluticasone (FLONASE) 50 MCG/ACT nasal spray Place 2 sprays into both nostrils daily. 48 g 1   hydrocortisone 2.5 % cream Apply topically as needed. 30 g 2   loratadine (CLARITIN) 10 MG tablet Take 10 mg by mouth daily as needed for allergies.     Multiple Vitamins-Minerals (MULTIVITAMIN WITH MINERALS) tablet Take 1 tablet by mouth daily.     Probiotic Product (PROBIOTIC DAILY PO) Take 1 tablet by mouth.     promethazine-dextromethorphan (PROMETHAZINE-DM) 6.25-15 MG/5ML syrup Take 5 mLs by mouth 4 (four) times daily as needed. 118 mL 0   Turmeric 500 MG CAPS Take 1 capsule by mouth daily.     No current facility-administered medications for this visit.    PHYSICAL EXAMINATION: ECOG PERFORMANCE STATUS: 1 - Symptomatic but completely ambulatory  Vitals:   08/30/20 1011  BP: 112/78  Pulse: 77  Resp: 18  Temp: 97.7 F (36.5 C)  SpO2: 100%   Filed Weights   08/30/20 1011  Weight: 143 lb 14.4 oz (65.3 kg)    BREAST: Bilateral mastectomies no palpable lumps or nodules in the chest wall or axilla. (exam performed in the presence of a chaperone)  LABORATORY DATA:  I have reviewed the data as listed CMP Latest Ref Rng & Units 05/07/2020 01/14/2020 01/22/2019  Glucose 70 - 99 mg/dL 87 99 93  BUN 6 - 23 mg/dL $Remove'12 15 13  'nqKyEIc$ Creatinine 0.40 - 1.20 mg/dL 0.62 0.75 0.78  Sodium 135 - 145 mEq/L 140 139 139  Potassium 3.5 - 5.1 mEq/L 3.9  4.9 4.1  Chloride 96 - 112 mEq/L 103 102 103  CO2 19 - 32 mEq/L 31 32 26  Calcium 8.4 - 10.5 mg/dL 9.3 10.0 10.0  Total Protein 6.0 - 8.3 g/dL 6.4 6.9 7.4  Total Bilirubin 0.2 - 1.2 mg/dL 1.4(H) 1.1 1.6(H)  Alkaline Phos 39 - 117 U/L 54 57 70  AST 0 - 37 U/L $Remo'11 11 15  'hAVKz$ ALT 0 - 35 U/L $Remo'8 9 12    'fcnbw$ Lab Results  Component Value Date   WBC 3.7 (L) 05/07/2020   HGB 11.9 (L) 05/07/2020   HCT 34.9 (L) 05/07/2020   MCV 96.2 05/07/2020   PLT 227.0 05/07/2020   NEUTROABS 2.5 05/07/2020    ASSESSMENT & PLAN:  Malignant neoplasm of upper-outer quadrant of right breast in female, estrogen receptor negative ([place]) 05/03/2018:right breast upper outer quadrant and right axillary lymph node biopsy 05/03/2018 for a clinical T2 N2, stage IIIC invasive ductal carcinoma,triple negative, with anKi-6775%, no deleterious genetic mutations  06/03/2018-10/18/2018: Neoadjuvant chemotherapy with dose dense Adriamycin and Cytoxan followed by Taxol and carboplatin  11/14/2018:Bilateral mastectomies: Left mastectomy: Fibrocystic changes, right mastectomy: Complete pathologic response no evidence of residual cancer, 0/13 lymph nodes negative Completed adjuvant radiation therapy --------------------------------------------------------------------------------------------------------------------------------------------------------------- Treatment plan: Surveillance November 2020: CT CAP, CTneck, CT head: No evidence of metastatic disease. Soft tissue edema in the right axilla 05/14/2019: Biopsy right axilla: Fibrosis no malignancy. Bone scan 05/20/2019:No evidence of bone metastases.  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 Breast/chest wall examination: Benign, no palpable lumps or nodules.  She has minor keloid on the left  chest wall. 2. no role of imaging studies because she had bilateral mastectomies.  Return to clinic in December for follow-up    No orders of the defined types were placed in this encounter.  The patient has a good understanding of the overall plan. she agrees with it. she will call with any problems that may develop before the next visit here.  Total time spent: 20 mins including face to face time and time spent for planning, charting and coordination of care  Rulon Eisenmenger, MD, MPH 08/30/2020  I, Molly Dorshimer, am acting as scribe for Dr. Nicholas Lose.  I have reviewed the above documentation for accuracy and completeness, and I agree with the above.

## 2020-08-30 ENCOUNTER — Other Ambulatory Visit: Payer: Self-pay

## 2020-08-30 ENCOUNTER — Inpatient Hospital Stay: Payer: BC Managed Care – PPO | Attending: Hematology and Oncology | Admitting: Hematology and Oncology

## 2020-08-30 DIAGNOSIS — C50411 Malignant neoplasm of upper-outer quadrant of right female breast: Secondary | ICD-10-CM | POA: Diagnosis not present

## 2020-08-30 DIAGNOSIS — Z853 Personal history of malignant neoplasm of breast: Secondary | ICD-10-CM | POA: Diagnosis not present

## 2020-08-30 DIAGNOSIS — Z171 Estrogen receptor negative status [ER-]: Secondary | ICD-10-CM | POA: Diagnosis not present

## 2020-08-30 DIAGNOSIS — Z9013 Acquired absence of bilateral breasts and nipples: Secondary | ICD-10-CM | POA: Insufficient documentation

## 2020-08-30 DIAGNOSIS — Z9221 Personal history of antineoplastic chemotherapy: Secondary | ICD-10-CM | POA: Insufficient documentation

## 2020-08-30 DIAGNOSIS — Z923 Personal history of irradiation: Secondary | ICD-10-CM | POA: Insufficient documentation

## 2020-08-30 NOTE — Assessment & Plan Note (Signed)
05/03/2018:right breast upper outer quadrant and right axillary lymph node biopsy 05/03/2018 for a clinical T2 N2, stage IIIC invasive ductal carcinoma,triple negative, with anKi-6775%, no deleterious genetic mutations  06/03/2018-10/18/2018: Neoadjuvant chemotherapy with dose dense Adriamycin and Cytoxan followed by Taxol and carboplatin  11/14/2018:Bilateral mastectomies: Left mastectomy: Fibrocystic changes, right mastectomy: Complete pathologic response no evidence of residual cancer, 0/13 lymph nodes negative Completed adjuvant radiation therapy --------------------------------------------------------------------------------------------------------------------------------------------------------------- Treatment plan: Surveillance November 2020: CT CAP, CTneck, CT head: No evidence of metastatic disease. Soft tissue edema in the right axilla 05/14/2019: Biopsy right axilla: Fibrosis no malignancy. Bone scan 05/20/2019:No evidence of bone metastases.  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 Breast/chest wall examination: Benign, no palpable lumps or nodules.  She has minor keloid on the left chest wall. 2. no role of imaging studies because she had bilateral mastectomies.  Return to clinic in 6 months for follow-up (she wants to be seen every 6 months with either surgery or [place])

## 2020-11-10 ENCOUNTER — Ambulatory Visit (INDEPENDENT_AMBULATORY_CARE_PROVIDER_SITE_OTHER): Payer: BC Managed Care – PPO | Admitting: Family Medicine

## 2020-11-10 ENCOUNTER — Encounter: Payer: Self-pay | Admitting: Family Medicine

## 2020-11-10 ENCOUNTER — Other Ambulatory Visit: Payer: Self-pay

## 2020-11-10 VITALS — BP 120/80 | HR 70 | Temp 98.0°F | Resp 20 | Ht 64.0 in | Wt 145.8 lb

## 2020-11-10 DIAGNOSIS — T451X5A Adverse effect of antineoplastic and immunosuppressive drugs, initial encounter: Secondary | ICD-10-CM | POA: Diagnosis not present

## 2020-11-10 DIAGNOSIS — D701 Agranulocytosis secondary to cancer chemotherapy: Secondary | ICD-10-CM

## 2020-11-10 DIAGNOSIS — Z0000 Encounter for general adult medical examination without abnormal findings: Secondary | ICD-10-CM | POA: Diagnosis not present

## 2020-11-10 LAB — BASIC METABOLIC PANEL
BUN: 15 mg/dL (ref 6–23)
CO2: 29 mEq/L (ref 19–32)
Calcium: 10.1 mg/dL (ref 8.4–10.5)
Chloride: 102 mEq/L (ref 96–112)
Creatinine, Ser: 0.72 mg/dL (ref 0.40–1.20)
GFR: 102.76 mL/min (ref >60.00)
Glucose, Bld: 96 mg/dL (ref 70–99)
Potassium: 3.8 mEq/L (ref 3.5–5.1)
Sodium: 139 mEq/L (ref 135–145)

## 2020-11-10 LAB — LIPID PANEL
Cholesterol: 176 mg/dL (ref 0–200)
HDL: 77.8 mg/dL (ref >39.00)
LDL Cholesterol: 90 mg/dL (ref 0–99)
NonHDL: 98.22
Total CHOL/HDL Ratio: 2
Triglycerides: 41 mg/dL (ref 0.0–149.0)
VLDL: 8.2 mg/dL (ref 0.0–40.0)

## 2020-11-10 LAB — CBC WITH DIFFERENTIAL/PLATELET
Basophils Absolute: 0 10*3/uL (ref 0.0–0.1)
Basophils Relative: 0.4 % (ref 0.0–3.0)
Eosinophils Absolute: 0 10*3/uL (ref 0.0–0.7)
Eosinophils Relative: 0.8 % (ref 0.0–5.0)
HCT: 38 % (ref 36.0–46.0)
Hemoglobin: 13.2 g/dL (ref 12.0–15.0)
Lymphocytes Relative: 25.4 % (ref 12.0–46.0)
Lymphs Abs: 1 10*3/uL (ref 0.7–4.0)
MCHC: 34.8 g/dL (ref 30.0–36.0)
MCV: 94.5 fl (ref 78.0–100.0)
Monocytes Absolute: 0.3 10*3/uL (ref 0.1–1.0)
Monocytes Relative: 6.6 % (ref 3.0–12.0)
Neutro Abs: 2.6 10*3/uL (ref 1.4–7.7)
Neutrophils Relative %: 66.8 % (ref 43.0–77.0)
Platelets: 192 10*3/uL (ref 150.0–400.0)
RBC: 4.03 Mil/uL (ref 3.87–5.11)
RDW: 13 % (ref 11.5–15.5)
WBC: 3.9 10*3/uL — ABNORMAL LOW (ref 4.0–10.5)

## 2020-11-10 LAB — HEPATIC FUNCTION PANEL
ALT: 10 U/L (ref 0–35)
AST: 12 U/L (ref 0–37)
Albumin: 4.9 g/dL (ref 3.5–5.2)
Alkaline Phosphatase: 59 U/L (ref 39–117)
Bilirubin, Direct: 0.2 mg/dL (ref 0.0–0.3)
Total Bilirubin: 1.2 mg/dL (ref 0.2–1.2)
Total Protein: 7.3 g/dL (ref 6.0–8.3)

## 2020-11-10 NOTE — Assessment & Plan Note (Signed)
Pt's PE WNL w/ exception of chronic R arm lymph edema.  UTD on pap, mammo, immunizations.  Check labs for work biometric screen.  Anticipatory guidance provided.

## 2020-11-10 NOTE — Patient Instructions (Signed)
Follow up in 1 year or as needed We'll notify you of your lab results and make any changes if needed Keep up the good work!  You look great! Call with any questions or concerns Stay Safe!  Stay Healthy! Have a great summer!!!

## 2020-11-10 NOTE — Progress Notes (Signed)
Subjective:    Patient ID: Darlene Grant, female    DOB: 09-26-1975, 45 y.o.   MRN: [id number]  HPI CPE- UTD on pap, Tdap, flu, COVID.  Reviewed past medical, surgical, family and social histories.   Patient Care Team    Relationship Specialty Notifications Start End  Midge Minium, MD PCP - General Family Medicine  04/20/20   Rolm Bookbinder, MD Consulting Physician General Surgery  05/10/18   Gery Pray, MD Consulting Physician Radiation Oncology  05/10/18   Emily Filbert, MD (Inactive) Consulting Physician Obstetrics and Gynecology  05/15/18   Lavonna Monarch, MD Consulting Physician Dermatology  05/15/18   Mauro Kaufmann, RN Oncology Nurse Navigator   10/04/18   Rockwell Germany, RN Oncology Nurse Navigator   10/04/18   Renelda Loma, OD  Optometry  01/15/20   Nicholas Lose, MD Consulting Physician Hematology and Oncology  04/20/20     Health Maintenance  Topic Date Due  . COVID-19 Vaccine (4 - Booster for Pfizer series) 06/04/2020  . INFLUENZA VACCINE  01/17/2021  . PAP SMEAR-Modifier  01/27/2024  . TETANUS/TDAP  02/23/2025  . HIV Screening  Completed  . HPV VACCINES  Aged Out  . MAMMOGRAM  Discontinued  . Hepatitis C Screening  Discontinued        Review of Systems Patient reports no vision/ hearing changes, adenopathy,fever, weight change,  persistant/recurrent hoarseness , swallowing issues, chest pain, palpitations, edema, persistant/recurrent cough, hemoptysis, dyspnea (rest/exertional/paroxysmal nocturnal), gastrointestinal bleeding (melena, rectal bleeding), abdominal pain, significant heartburn, bowel changes, GU symptoms (dysuria, hematuria, incontinence), Gyn symptoms (abnormal  bleeding, pain),  syncope, focal weakness, memory loss, numbness & tingling, skin/hair/nail changes, abnormal bruising or bleeding, anxiety, or depression.   This visit occurred during the SARS-CoV-2 public health emergency.  Safety protocols were in place, including  screening questions prior to the visit, additional usage of staff PPE, and extensive cleaning of exam room while observing appropriate contact time as indicated for disinfecting solutions.       Objective:   Physical Exam General Appearance:    Alert, cooperative, no distress, appears stated age  Head:    Normocephalic, without obvious abnormality, atraumatic  Eyes:    PERRL, conjunctiva/corneas clear, EOM's intact, both eyes  Ears:    Normal TM's and external ear canals, both ears  Nose:   Deferred due to COVID  Throat:   Neck:   Supple, symmetrical, trachea midline, no adenopathy;    Thyroid: no enlargement/tenderness/nodules  Back:     Symmetric, no curvature, ROM normal, no CVA tenderness  Lungs:     Clear to auscultation bilaterally, respirations unlabored  Chest Wall:    No tenderness or deformity   Heart:    Regular rate and rhythm, S1 and S2 normal, no murmur, rub   or gallop  Breast Exam:    Deferred to GYN  Abdomen:     Soft, non-tender, bowel sounds active all four quadrants,    no masses, no organomegaly  Genitalia:    Deferred to GYN  Rectal:    Extremities:   Extremities normal, atraumatic, no cyanosis, R arm lymph edema  Pulses:   2+ and symmetric all extremities  Skin:   Skin color, texture, turgor normal, no rashes or lesions  Lymph nodes:   Cervical, supraclavicular, and axillary nodes normal  Neurologic:   CNII-XII intact, normal strength, sensation and reflexes    throughout          Assessment & Plan:

## 2020-11-10 NOTE — Assessment & Plan Note (Signed)
Check CBC 

## 2020-11-11 ENCOUNTER — Encounter: Payer: Self-pay | Admitting: Family Medicine

## 2020-12-13 DIAGNOSIS — D2371 Other benign neoplasm of skin of right lower limb, including hip: Secondary | ICD-10-CM | POA: Diagnosis not present

## 2020-12-13 DIAGNOSIS — D1801 Hemangioma of skin and subcutaneous tissue: Secondary | ICD-10-CM | POA: Diagnosis not present

## 2020-12-13 DIAGNOSIS — D225 Melanocytic nevi of trunk: Secondary | ICD-10-CM | POA: Diagnosis not present

## 2020-12-13 DIAGNOSIS — L814 Other melanin hyperpigmentation: Secondary | ICD-10-CM | POA: Diagnosis not present

## 2020-12-15 ENCOUNTER — Encounter: Payer: Self-pay | Admitting: *Deleted

## 2020-12-16 ENCOUNTER — Encounter: Payer: Self-pay | Admitting: Family Medicine

## 2021-01-10 DIAGNOSIS — C50411 Malignant neoplasm of upper-outer quadrant of right female breast: Secondary | ICD-10-CM | POA: Diagnosis not present

## 2021-01-10 DIAGNOSIS — Z171 Estrogen receptor negative status [ER-]: Secondary | ICD-10-CM | POA: Diagnosis not present

## 2021-02-26 DIAGNOSIS — Z23 Encounter for immunization: Secondary | ICD-10-CM | POA: Diagnosis not present

## 2021-03-02 ENCOUNTER — Telehealth: Payer: Self-pay | Admitting: *Deleted

## 2021-03-02 NOTE — Telephone Encounter (Signed)
Left patient a message that her appointment on 03/14/2021 had to be moved from 10:30 to 10:50 AM with a 10:35 AM arrival due to her being scheduled as an overbook. Patient asked to return call if that will not work with her schedule.

## 2021-03-14 ENCOUNTER — Other Ambulatory Visit (HOSPITAL_COMMUNITY)
Admission: RE | Admit: 2021-03-14 | Discharge: 2021-03-14 | Disposition: A | Discharge status: Home / Self Care | Payer: BC Managed Care – PPO | Source: Ambulatory Visit | Attending: Obstetrics and Gynecology | Admitting: Obstetrics and Gynecology

## 2021-03-14 ENCOUNTER — Ambulatory Visit (INDEPENDENT_AMBULATORY_CARE_PROVIDER_SITE_OTHER): Payer: BC Managed Care – PPO | Admitting: Obstetrics and Gynecology

## 2021-03-14 ENCOUNTER — Telehealth: Payer: Self-pay | Admitting: *Deleted

## 2021-03-14 ENCOUNTER — Other Ambulatory Visit: Payer: Self-pay

## 2021-03-14 ENCOUNTER — Encounter: Payer: Self-pay | Admitting: Obstetrics and Gynecology

## 2021-03-14 VITALS — BP 115/81 | HR 76 | Temp 98.4°F | Ht 64.5 in | Wt 149.0 lb

## 2021-03-14 DIAGNOSIS — Z01419 Encounter for gynecological examination (general) (routine) without abnormal findings: Secondary | ICD-10-CM

## 2021-03-14 DIAGNOSIS — N939 Abnormal uterine and vaginal bleeding, unspecified: Secondary | ICD-10-CM | POA: Diagnosis not present

## 2021-03-14 DIAGNOSIS — Z0000 Encounter for general adult medical examination without abnormal findings: Secondary | ICD-10-CM

## 2021-03-14 MED ORDER — HYDROCORTISONE 2.5 % EX CREA: TOPICAL_CREAM | CUTANEOUS | 2 refills | Status: DC | PRN

## 2021-03-14 MED ORDER — CYCLOBENZAPRINE HCL 10 MG PO TABS: 10.0000 mg | ORAL_TABLET | ORAL | 2 refills | Status: DC | PRN

## 2021-03-14 NOTE — Telephone Encounter (Signed)
Left patient an urgent message to call the office to reschedule appointment with Dr. Gala Romney if that is who she wants to see or keep appointment with Dr. Rip Harbour.

## 2021-03-14 NOTE — Progress Notes (Addendum)
Darlene Grant is a 45 y.o. G32P2002 female here for a routine annual gynecologic exam.  Current complaints: AUB.   Denies  discharge, pelvic pain, problems with intercourse or other gynecologic concerns.    Gynecologic History Patient's last menstrual period was 02/17/2021 (approximate). Contraception: tubal ligation Last Pap: 8/20. Results were: normal Last mammogram: followed by Heme/Onc due to Stage 3 C breast cancer.   Obstetric History OB History  Gravida Para Term Preterm AB Living  2 2 2     2   SAB IAB Ectopic Multiple Live Births        0 2    # Outcome Date GA Lbr Len/2nd Weight Sex Delivery Anes PTL Lv  2 Term 06/23/16 [redacted]w[redacted]d  8 lb 14.5 oz (4.04 kg) F CS-LTranv Spinal  LIV  1 Term 11/21/13 [redacted]w[redacted]d  9 lb 4.8 oz (4.218 kg) M CS-LTranv EPI  LIV    Past Medical History:  Diagnosis Date   Abnormal Pap smear    ASCUS 03/13/11   Anemia    Breast cancer (HCC)    Complication of anesthesia    pt states that all narcotics make her angry and she would prefer to avoid them.   Grade I hemorrhoids 02/23/2017   Piriformis syndrome of left side 01/15/2018   Vaginal Pap smear, abnormal     Past Surgical History:  Procedure Laterality Date   BREAST BIOPSY Right 8/16   CESAREAN SECTION N/A 11/21/2013   Procedure: CESAREAN SECTION;  Surgeon: Alwyn Pea, MD;  Location: [facility];  Service: Obstetrics;  Laterality: N/A;   CESAREAN SECTION N/A 06/23/2016   Procedure: CESAREAN SECTION;  Surgeon: Emily Filbert, MD;  Location: [facility];  Service: Obstetrics;  Laterality: N/A;   HAND SURGERY  04/2012   ligament repair   MASTECTOMY WITH AXILLARY LYMPH NODE DISSECTION Bilateral 11/14/2018   Procedure: RIGHT MASTECTOMY WITH RIGHT AXILLARY LYMPH NODE DISSECTION AND LEFT RISK REDUCING MASTECTOMY;  Surgeon: Rolm Bookbinder, MD;  Location: [facility];  Service: General;  Laterality: Bilateral;  LATE ENTRY: Corrected laterality documentation to read right axillary  lymph node dissection vs. documented laterality of left.     PILONIDAL CYST EXCISION  2000   PORTACATH PLACEMENT N/A 05/28/2018   Procedure: INSERTION PORT-A-CATH WITH ULTRASOUND;  Surgeon: Rolm Bookbinder, MD;  Location: [facility];  Service: General;  Laterality: N/A;   WISDOM TOOTH EXTRACTION  AGE 32    Current Outpatient Medications on File Prior to Visit  Medication Sig Dispense Refill   B Complex-C (B-COMPLEX WITH VITAMIN C) tablet Take 1 tablet by mouth daily as needed.      Calcium Acetate-Magnesium Carb 450-200 MG TABS Take by mouth.     Cholecalciferol (VITAMIN D) 125 MCG (5000 UT) CAPS Take 5,000 Units by mouth daily. 30 capsule    fluticasone (FLONASE) 50 MCG/ACT nasal spray Place 2 sprays into both nostrils daily. 48 g 1   loratadine (CLARITIN) 10 MG tablet Take 10 mg by mouth daily as needed for allergies.     Multiple Vitamins-Minerals (MULTIVITAMIN WITH MINERALS) tablet Take 1 tablet by mouth daily.     Probiotic Product (PROBIOTIC DAILY PO) Take 1 tablet by mouth.     Turmeric 500 MG CAPS Take 1 capsule by mouth daily.     No current facility-administered medications on file prior to visit.    Allergies  Allergen Reactions   Amoxicillin-Pot Clavulanate Diarrhea    diarrhea diarrhea diarrhea diarrhea diarrhea   Hydrocodone  Itching   Keflex [Cephalexin] Diarrhea    Social History   Socioeconomic History   Marital status: Married    Spouse name: Elberta Fortis   Number of children: 2   Years of education: Not on file   Highest education level: Not on file  Occupational History   Occupation: occupational therapist  Tobacco Use   Smoking status: Never   Smokeless tobacco: Never  Vaping Use   Vaping Use: Never used  Substance and Sexual Activity   Alcohol use: Yes    Alcohol/week: 0.0 standard drinks    Comment: occassionally   Drug use: No   Sexual activity: Yes    Partners: Male    Birth control/protection: None    Comment: Vasectomy    Other Topics Concern   Not on file  Social History Narrative   Marital status/children/pets: Married. 2 children.   Education/employment: Masters degree. Employed as an Warden/ranger.   Safety:      -Wears a bicycle helmet riding a bike: Yes     -smoke alarm in the home:Yes     - wears seatbelt: Yes     - Feels safe in their relationships: Yes   Social Determinants of Health   Financial Resource Strain: Not on file  Food Insecurity: Not on file  Transportation Needs: Not on file  Physical Activity: Not on file  Stress: Not on file  Social Connections: Not on file  Intimate Partner Violence: Not on file    Family History  Problem Relation Age of Onset   Breast cancer Mother 27   Hypertension Father    Stomach cancer Maternal Grandmother    Skin cancer Maternal Grandmother    Depression Maternal Grandmother    Lung cancer Maternal Grandfather        lung    The following portions of the patient's history were reviewed and updated as appropriate: allergies, current medications, past family history, past medical history, past social history, past surgical history and problem list.  Review of Systems Pertinent items noted in HPI and remainder of comprehensive ROS otherwise negative.   Objective:  BP 115/81   Pulse 76   Temp 98.4 F (36.9 C)   Ht 5' 4.5" (1.638 m)   Wt 149 lb (67.6 kg)   LMP 02/17/2021 (Approximate)   BMI 25.18 kg/m   Chaperone present during exam.  CONSTITUTIONAL: Well-developed, well-nourished female in no acute distress.  HENT:  Normocephalic, atraumatic, External right and left ear normal. Oropharynx is clear and moist EYES: Conjunctivae and EOM are normal. Pupils are equal, round, and reactive to light. No scleral icterus.  NECK: Normal range of motion, supple, no masses.  Normal thyroid.  SKIN: Skin is warm and dry. No rash noted. Not diaphoretic. No erythema. No pallor. [place]: Alert and oriented to person, place, and time.  Normal reflexes, muscle tone coordination. No cranial nerve deficit noted. PSYCHIATRIC: Normal mood and affect. Normal behavior. Normal judgment and thought content. CARDIOVASCULAR: Normal heart rate noted, regular rhythm RESPIRATORY: Clear to auscultation bilaterally. Effort and breath sounds normal, no problems with respiration noted. BREASTS: Deffered ABDOMEN: Soft, normal bowel sounds, no distention noted.  No tenderness, rebound or guarding.  PELVIC: Normal appearing external genitalia; normal appearing vaginal mucosa and cervix.  No abnormal discharge noted.  Pap smear obtained.  Normal uterine size, no other palpable masses, no uterine or adnexal tenderness. MUSCULOSKELETAL: Normal range of motion. No tenderness.  No cyanosis, clubbing, or edema.  2+ distal pulses.   Assessment:  Annual gynecologic examination with pap smear AUB Plan:  Will follow up results of pap smear and manage accordingly. Will check GYN U/S Routine preventative health maintenance measures emphasized. Please refer to After Visit Summary for other counseling recommendations.    Chancy Milroy, MD, [place] Attending [place] for [facility], [place]

## 2021-03-14 NOTE — Patient Instructions (Signed)
Health Maintenance, Female Adopting a healthy lifestyle and getting preventive care are important in promoting health and wellness. Ask your health care provider about: The right schedule for you to have regular tests and exams. Things you can do on your own to prevent diseases and keep yourself healthy. What should I know about diet, weight, and exercise? Eat a healthy diet  Eat a diet that includes plenty of vegetables, fruits, low-fat dairy products, and lean protein. Do not eat a lot of foods that are high in solid fats, added sugars, or sodium. Maintain a healthy weight Body mass index (BMI) is used to identify weight problems. It estimates body fat based on height and weight. Your health care provider can help determine your BMI and help you achieve or maintain a healthy weight. Get regular exercise Get regular exercise. This is one of the most important things you can do for your health. Most adults should: Exercise for at least 150 minutes each week. The exercise should increase your heart rate and make you sweat (moderate-intensity exercise). Do strengthening exercises at least twice a week. This is in addition to the moderate-intensity exercise. Spend less time sitting. Even light physical activity can be beneficial. Watch cholesterol and blood lipids Have your blood tested for lipids and cholesterol at 43 years of age, then have this test every 5 years. Have your cholesterol levels checked more often if: Your lipid or cholesterol levels are high. You are older than 43 years of age. You are at high risk for heart disease. What should I know about cancer screening? Depending on your health history and family history, you may need to have cancer screening at various ages. This may include screening for: Breast cancer. Cervical cancer. Colorectal cancer. Skin cancer. Lung cancer. What should I know about heart disease, diabetes, and high blood pressure? Blood pressure and heart  disease High blood pressure causes heart disease and increases the risk of stroke. This is more likely to develop in people who have high blood pressure readings, are of African descent, or are overweight. Have your blood pressure checked: Every 3-5 years if you are 18-39 years of age. Every year if you are 40 years old or older. Diabetes Have regular diabetes screenings. This checks your fasting blood sugar level. Have the screening done: Once every three years after age 40 if you are at a normal weight and have a low risk for diabetes. More often and at a younger age if you are overweight or have a high risk for diabetes. What should I know about preventing infection? Hepatitis B If you have a higher risk for hepatitis B, you should be screened for this virus. Talk with your health care provider to find out if you are at risk for hepatitis B infection. Hepatitis C Testing is recommended for: Everyone born from 1945 through 1965. Anyone with known risk factors for hepatitis C. Sexually transmitted infections (STIs) Get screened for STIs, including gonorrhea and chlamydia, if: You are sexually active and are younger than 43 years of age. You are older than 43 years of age and your health care provider tells you that you are at risk for this type of infection. Your sexual activity has changed since you were last screened, and you are at increased risk for chlamydia or gonorrhea. Ask your health care provider if you are at risk. Ask your health care provider about whether you are at high risk for HIV. Your health care provider may recommend a prescription medicine   to help prevent HIV infection. If you choose to take medicine to prevent HIV, you should first get tested for HIV. You should then be tested every 3 months for as long as you are taking the medicine. Pregnancy If you are about to stop having your period (premenopausal) and you may become pregnant, seek counseling before you get  pregnant. Take 400 to 800 micrograms (mcg) of folic acid every day if you become pregnant. Ask for birth control (contraception) if you want to prevent pregnancy. Osteoporosis and menopause Osteoporosis is a disease in which the bones lose minerals and strength with aging. This can result in bone fractures. If you are 65 years old or older, or if you are at risk for osteoporosis and fractures, ask your health care provider if you should: Be screened for bone loss. Take a calcium or vitamin D supplement to lower your risk of fractures. Be given hormone replacement therapy (HRT) to treat symptoms of menopause. Follow these instructions at home: Lifestyle Do not use any products that contain nicotine or tobacco, such as cigarettes, e-cigarettes, and chewing tobacco. If you need help quitting, ask your health care provider. Do not use street drugs. Do not share needles. Ask your health care provider for help if you need support or information about quitting drugs. Alcohol use Do not drink alcohol if: Your health care provider tells you not to drink. You are pregnant, may be pregnant, or are planning to become pregnant. If you drink alcohol: Limit how much you use to 0-1 drink a day. Limit intake if you are breastfeeding. Be aware of how much alcohol is in your drink. In the [place] one drink equals one 12 oz bottle of beer (355 mL), one 5 oz glass of wine (148 mL), or one 1 oz glass of hard liquor (44 mL). General instructions Schedule regular health, dental, and eye exams. Stay current with your vaccines. Tell your health care provider if: You often feel depressed. You have ever been abused or do not feel safe at home. Summary Adopting a healthy lifestyle and getting preventive care are important in promoting health and wellness. Follow your health care provider's instructions about healthy diet, exercising, and getting tested or screened for diseases. Follow your health care provider's  instructions on monitoring your cholesterol and blood pressure. This information is not intended to replace advice given to you by your health care provider. Make sure you discuss any questions you have with your health care provider. Document Revised: 08/13/2020 Document Reviewed: 05/29/2018 Elsevier Patient Education  2022 Elsevier Inc.  

## 2021-03-15 LAB — CYTOLOGY - PAP
Comment: NEGATIVE
Diagnosis: NEGATIVE
High risk HPV: NEGATIVE

## 2021-04-01 ENCOUNTER — Encounter: Payer: Self-pay | Admitting: Family Medicine

## 2021-04-06 ENCOUNTER — Other Ambulatory Visit: Payer: Self-pay

## 2021-04-06 ENCOUNTER — Ambulatory Visit (INDEPENDENT_AMBULATORY_CARE_PROVIDER_SITE_OTHER): Payer: BC Managed Care – PPO

## 2021-04-06 DIAGNOSIS — N939 Abnormal uterine and vaginal bleeding, unspecified: Secondary | ICD-10-CM | POA: Diagnosis not present

## 2021-04-25 ENCOUNTER — Other Ambulatory Visit: Payer: Self-pay

## 2021-04-25 ENCOUNTER — Ambulatory Visit: Payer: BC Managed Care – PPO | Admitting: Obstetrics & Gynecology

## 2021-04-25 ENCOUNTER — Encounter: Payer: Self-pay | Admitting: Obstetrics & Gynecology

## 2021-04-25 ENCOUNTER — Other Ambulatory Visit (HOSPITAL_COMMUNITY)
Admission: RE | Admit: 2021-04-25 | Discharge: 2021-04-25 | Disposition: A | Discharge status: Home / Self Care | Payer: BC Managed Care – PPO | Source: Ambulatory Visit | Attending: Obstetrics & Gynecology | Admitting: Obstetrics & Gynecology

## 2021-04-25 VITALS — BP 112/67 | HR 77 | Resp 16 | Ht 64.0 in | Wt 147.0 lb

## 2021-04-25 DIAGNOSIS — Z01812 Encounter for preprocedural laboratory examination: Secondary | ICD-10-CM | POA: Diagnosis not present

## 2021-04-25 DIAGNOSIS — N926 Irregular menstruation, unspecified: Secondary | ICD-10-CM

## 2021-04-25 DIAGNOSIS — N939 Abnormal uterine and vaginal bleeding, unspecified: Secondary | ICD-10-CM | POA: Diagnosis not present

## 2021-04-25 LAB — POCT URINE PREGNANCY: Preg Test, Ur: NEGATIVE

## 2021-04-25 NOTE — Progress Notes (Addendum)
Subjective:    Patient ID: Darlene Grant, female    DOB: 1976-04-10, 45 y.o.   MRN: [id number]  HPI  Pt presents c/o abnormal bleeding.  Pt has hx of abnml uterine bleeding with a couple of "normal flows" this past year.  She does have brown spotting some months.  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  She was in menopause during chemo however she did regain her menstrual cycle.  It is now sparse or as described above and she is also having hot flashes.  EXAM: TRANSABDOMINAL AND TRANSVAGINAL ULTRASOUND OF PELVIS   TECHNIQUE: Both transabdominal and transvaginal ultrasound examinations of the pelvis were performed. Transabdominal technique was performed for global imaging of the pelvis including uterus, ovaries, adnexal regions, and pelvic cul-de-sac. It was necessary to proceed with endovaginal exam following the transabdominal exam to visualize the ovaries and endometrium.   COMPARISON:  None.   FINDINGS: Uterus   Measurements: 8.9 x 3.4 x 4.2 cm = volume: 67 mL. No fibroids or other mass visualized.   Endometrium   Thickness: Not measured. The endometrium is poorly visualized without clear margins between the endometrium and myometrium. There is a punctate 3 mm echogenic area in the region of the endometrium, possibly calcification.   Right ovary   Measurements: 2.7 x 1.5 x 1.5 cm = volume: 3.1 mL. Normal appearance/no adnexal mass.   Left ovary   Measurements: 2.8 x 1.1 x 1.8 cm = volume: 2.8 mL. There is a 1.2 x 1.0 hypoechoic heterogeneous area in the left ovary which may represent corpus luteum.   Other findings   No abnormal free fluid.   IMPRESSION: 1. The endometrium is not well delineated on this study. There is likely a punctate calcification in the endometrium. Finding can be seen in the setting of adenomyosis. This can be further evaluated with MRI.     Electronically Signed   By: Ronney Asters M.D.   On: 04/06/2021  23:15   Review of Systems  Genitourinary:  Positive for vaginal bleeding.      Objective:   Physical Exam Vitals reviewed.  Constitutional:      General: She is not in acute distress.    Appearance: She is well-developed.  HENT:     Head: Normocephalic and atraumatic.  Eyes:     Conjunctiva/sclera: Conjunctivae normal.  Cardiovascular:     Rate and Rhythm: Normal rate.  Pulmonary:     Effort: Pulmonary effort is normal.  Skin:    General: Skin is warm and dry.  Neurological:     Mental Status: She is alert and oriented to person, place, and time.  Psychiatric:        Mood and Affect: Mood normal.   Vitals:   04/25/21 1430  BP: 112/67  Pulse: 77  Resp: 16  Weight: 147 lb (66.7 kg)  Height: 5\' 4"  (1.626 m)       Assessment & Plan:  45 year old female, breast cancer survivor, with abnormal uterine bleeding and endometrium ill-defined on ultrasound as above. Endometrial biopsy today TSH, FSH, prolactin to look at secondary amenorrhea.  ENDOMETRIAL BIOPSY     The indications for endometrial biopsy were reviewed.   Risks of the biopsy including cramping, bleeding, infection, uterine perforation, inadequate specimen and need for additional procedures  were discussed. The patient states she understands and agrees to undergo procedure today. Consent was signed. Time out was performed. Urine HCG was negative. A sterile speculum was placed in the  patient's vagina and the cervix was prepped with Betadine. A single-toothed tenaculum was placed on the anterior lip of the cervix to stabilize it. The 3 mm pipelle was introduced into the endometrial cavity without difficulty to a depth of 7.5 cm, and a moderate amount of tissue was obtained and sent to pathology. The instruments were removed from the patient's vagina. Minimal bleeding from the cervix was noted. The patient tolerated the procedure well. Routine post-procedure instructions were given to the patient. The patient will follow  up to review the results and for further management.

## 2021-04-26 LAB — TSH: TSH: 0.92 mIU/L

## 2021-04-26 LAB — PROLACTIN: Prolactin: 5.9 ng/mL

## 2021-04-26 LAB — FOLLICLE STIMULATING HORMONE: FSH: 70.8 m[IU]/mL

## 2021-04-27 LAB — SURGICAL PATHOLOGY

## 2021-05-02 ENCOUNTER — Ambulatory Visit: Payer: BC Managed Care – PPO | Admitting: Hematology and Oncology

## 2021-05-28 ENCOUNTER — Encounter: Payer: Self-pay | Admitting: Family Medicine

## 2021-05-29 NOTE — Progress Notes (Signed)
Patient Care Team: Darlene Minium, MD as PCP - General (Family Medicine) Darlene Bookbinder, MD as Consulting Physician (General Surgery) Darlene Pray, MD as Consulting Physician (Radiation Oncology) Darlene Filbert, MD (Inactive) as Consulting Physician (Obstetrics and Gynecology) Darlene Monarch, MD as Consulting Physician (Dermatology) Darlene Kaufmann, RN as Oncology Nurse Navigator Darlene Germany, RN as Oncology Nurse Navigator [place], Darlene Grant, OD (Optometry) Darlene Lose, MD as Consulting Physician (Hematology and Oncology)  DIAGNOSIS:    ICD-10-CM   1. Malignant neoplasm of upper-outer quadrant of right breast in female, estrogen receptor negative (Darlene Grant)  C50.411    Z17.1       SUMMARY OF ONCOLOGIC HISTORY: Oncology History  Malignant neoplasm of upper-outer quadrant of right breast in female, estrogen receptor negative (Darlene Grant)  05/03/2018 Initial Diagnosis   right breast upper outer quadrant and right axillary lymph node biopsy 05/03/2018 for a clinical T2 N2, stage IIIC invasive ductal carcinoma, triple negative, with an Ki-67 of 75%   06/03/2018 - 10/18/2018 Neo-Adjuvant Chemotherapy   Dose dense Adriamycin and Cytoxan followed by Taxol and carboplatin x12   11/14/2018 Surgery   Bilateral mastectomies: Left mastectomy: Fibrocystic changes, right mastectomy: Complete pathologic response no evidence of residual cancer, 0/13 lymph nodes negative   12/31/2018 - 02/10/2019 Radiation Therapy   Adjuvant XRT     CHIEF COMPLIANT: Follow-up of triple negative right breast cancer  INTERVAL HISTORY: Darlene Grant is a 45 y.o. with above-mentioned history of triple negative right breast cancer treated with neoadjuvant chemotherapy, bilateral mastectomies, radiation, and who is currently on surveillance. She presents to the clinic today for follow-up.   ALLERGIES:  is allergic to amoxicillin-pot clavulanate, hydrocodone, and keflex [cephalexin].  MEDICATIONS:   Current Outpatient Medications  Medication Sig Dispense Refill   B Complex-C (B-COMPLEX WITH VITAMIN C) tablet Take 1 tablet by mouth daily as needed.      Calcium Acetate-Magnesium Carb 450-200 MG TABS Take by mouth.     Cholecalciferol (VITAMIN D) 125 MCG (5000 UT) CAPS Take 5,000 Units by mouth daily. 30 capsule    cyclobenzaprine (FLEXERIL) 10 MG tablet Take 1 tablet (10 mg total) by mouth as needed for muscle spasms. 1/2 to I tab TID 30 tablet 2   fluticasone (FLONASE) 50 MCG/ACT nasal spray Place 2 sprays into both nostrils daily. 48 g 1   hydrocortisone 2.5 % cream Apply topically as needed. 30 g 2   loratadine (CLARITIN) 10 MG tablet Take 10 mg by mouth daily as needed for allergies.     Multiple Vitamins-Minerals (MULTIVITAMIN WITH MINERALS) tablet Take 1 tablet by mouth daily.     Probiotic Product (PROBIOTIC DAILY PO) Take 1 tablet by mouth.     Turmeric 500 MG CAPS Take 1 capsule by mouth daily.     No current facility-administered medications for this visit.    PHYSICAL EXAMINATION: ECOG PERFORMANCE STATUS: 1 - Symptomatic but completely ambulatory  Vitals:   05/30/21 1030  BP: (!) 148/84  Pulse: 92  Resp: 18  Temp: (!) 97.5 F (36.4 C)  SpO2: 100%   Filed Weights   05/30/21 1030  Weight: 150 lb 11.2 oz (68.4 kg)    BREAST: No palpable masses or nodules in either right or left breasts. No palpable axillary supraclavicular or infraclavicular adenopathy no breast tenderness or nipple discharge. (exam performed in the presence of a chaperone)  LABORATORY DATA:  I have reviewed the data as listed CMP Latest Ref Rng & Units 11/10/2020 05/07/2020 01/14/2020  Glucose 70 - 99 mg/dL 96 87 99  BUN 6 - 23 mg/dL _0 Creatinine 0.40 - 1.20 mg/dL 0.72 0.62 0.75  Sodium 135 - 145 mEq/L 139 140 139  Potassium 3.5 - 5.1 mEq/L 3.8 3.9 4.9  Chloride 96 - 112 mEq/L 102 103 102  CO2 19 - 32 mEq/L 29 31 32  Calcium 8.4 - 10.5 mg/dL 10.1 9.3 10.0  Total Protein 6.0 - 8.3  g/dL 7.3 6.4 6.9  Total Bilirubin 0.2 - 1.2 mg/dL 1.2 1.4(H) 1.1  Alkaline Phos 39 - 117 U/L 59 54 57  AST 0 - 37 U/L _1 ALT 0 - 35 U/L _2 Lab Results  Component Value Date   WBC 3.9 (L) 11/10/2020   HGB 13.2 11/10/2020   HCT 38.0 11/10/2020   MCV 94.5 11/10/2020   PLT 192.0 11/10/2020   NEUTROABS 2.6 11/10/2020    ASSESSMENT & PLAN:  Malignant neoplasm of upper-outer quadrant of right breast in female, estrogen receptor negative (Darlene Grant) 05/03/2018: right breast upper outer quadrant and right axillary lymph node biopsy 05/03/2018 for a clinical T2 N2, stage IIIC invasive ductal carcinoma, triple negative, with an Ki-67 75%, no deleterious genetic mutations   06/03/2018-10/18/2018: Neoadjuvant chemotherapy with dose dense Adriamycin and Cytoxan followed by Taxol and carboplatin   11/14/2018: Bilateral mastectomies: Left mastectomy: Fibrocystic changes, right mastectomy: Complete pathologic response no evidence of residual cancer, 0/13 lymph nodes negative Completed adjuvant radiation therapy ---------------------------------------------------------------------------------------------------------------------------------------------------------------  Treatment plan: Surveillance November 2020: CT CAP, CTneck, CT head: No evidence of metastatic disease.  Soft tissue edema in the right axilla 05/14/2019: Biopsy right axilla: Fibrosis no malignancy. Bone scan 05/20/2019: No evidence of bone metastases.   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 Breast/chest wall examination: 05/30/21: Benign, no palpable lumps or nodules.  She has minor keloid on the left chest wall. 2. no role of imaging studies because she had bilateral mastectomies.   Return to clinic in 1 year for follow-up      No  orders of the defined types were placed in this encounter.  The patient has a good understanding of the overall plan. she agrees with it. she will call with any problems that may develop before the next visit here.  Total time spent: 20 mins including face to face time and time spent for planning, charting and coordination of care  Rulon Eisenmenger, MD, MPH 05/30/2021  I, Thana Ates, am acting as scribe for Dr. Nicholas Grant.  I have reviewed the above documentation for accuracy and completeness, and I agree with the above.

## 2021-05-29 NOTE — Assessment & Plan Note (Signed)
05/03/2018:right breast upper outer quadrant and right axillary lymph node biopsy 05/03/2018 for a clinical T2 N2, stage IIIC invasive ductal carcinoma,triple negative, with anKi-6775%, no deleterious genetic mutations  06/03/2018-10/18/2018: Neoadjuvant chemotherapy with dose dense Adriamycin and Cytoxan followed by Taxol and carboplatin  11/14/2018:Bilateral mastectomies: Left mastectomy: Fibrocystic changes, right mastectomy: Complete pathologic response no evidence of residual cancer, 0/13 lymph nodes negative Completed adjuvant radiation therapy --------------------------------------------------------------------------------------------------------------------------------------------------------------- Treatment plan: Surveillance November 2020: CT CAP, CTneck, CT head: No evidence of metastatic disease. Soft tissue edema in the right axilla 05/14/2019: Biopsy right axilla: Fibrosis no malignancy. Bone scan 05/20/2019:No evidence of bone metastases.  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 Breast/chest wall examination: 05/30/21: Benign, no palpable lumps or nodules.She has minor keloid on the left chest wall. 2. no role of imaging studies because she had bilateral mastectomies.  Return to clinic in 1 year for follow-up

## 2021-05-30 ENCOUNTER — Other Ambulatory Visit: Payer: Self-pay

## 2021-05-30 ENCOUNTER — Inpatient Hospital Stay: Payer: BC Managed Care – PPO | Attending: Hematology and Oncology | Admitting: Hematology and Oncology

## 2021-05-30 DIAGNOSIS — Z853 Personal history of malignant neoplasm of breast: Secondary | ICD-10-CM | POA: Insufficient documentation

## 2021-05-30 DIAGNOSIS — C50411 Malignant neoplasm of upper-outer quadrant of right female breast: Secondary | ICD-10-CM | POA: Diagnosis not present

## 2021-05-30 DIAGNOSIS — Z171 Estrogen receptor negative status [ER-]: Secondary | ICD-10-CM

## 2021-05-30 MED ORDER — FLUTICASONE PROPIONATE 50 MCG/ACT NA SUSP: 2.0000 | Freq: Every day | NASAL | 1 refills | Status: DC

## 2021-07-19 ENCOUNTER — Encounter: Payer: Self-pay | Admitting: Family Medicine

## 2021-07-19 NOTE — Telephone Encounter (Signed)
Pt is asking for advice on COVID infection

## 2021-07-20 NOTE — Telephone Encounter (Signed)
Pt is asking if you are concerned about her WBC as is and also would like to know if you would like to see her given her hxt with Chemo

## 2021-08-08 ENCOUNTER — Encounter: Payer: Self-pay | Admitting: Family Medicine

## 2021-08-10 ENCOUNTER — Encounter: Payer: Self-pay | Admitting: Family Medicine

## 2021-08-22 ENCOUNTER — Encounter: Payer: Self-pay | Admitting: Obstetrics & Gynecology

## 2021-08-23 ENCOUNTER — Encounter: Payer: Self-pay | Admitting: Family Medicine

## 2021-11-22 ENCOUNTER — Encounter: Payer: Self-pay | Admitting: Hematology and Oncology

## 2021-11-24 ENCOUNTER — Ambulatory Visit (INDEPENDENT_AMBULATORY_CARE_PROVIDER_SITE_OTHER): Payer: BC Managed Care – PPO | Admitting: Family Medicine

## 2021-11-24 ENCOUNTER — Encounter: Payer: Self-pay | Admitting: Family Medicine

## 2021-11-24 VITALS — BP 116/78 | HR 81 | Temp 98.1°F | Resp 16 | Ht 63.75 in | Wt 150.1 lb

## 2021-11-24 DIAGNOSIS — Z0000 Encounter for general adult medical examination without abnormal findings: Secondary | ICD-10-CM | POA: Diagnosis not present

## 2021-11-24 DIAGNOSIS — R5383 Other fatigue: Secondary | ICD-10-CM | POA: Diagnosis not present

## 2021-11-24 DIAGNOSIS — E663 Overweight: Secondary | ICD-10-CM | POA: Diagnosis not present

## 2021-11-24 LAB — CBC WITH DIFFERENTIAL/PLATELET
Basophils Absolute: 0 10*3/uL (ref 0.0–0.1)
Basophils Relative: 0.4 % (ref 0.0–3.0)
Eosinophils Absolute: 0 10*3/uL (ref 0.0–0.7)
Eosinophils Relative: 0.3 % (ref 0.0–5.0)
HCT: 36.6 % (ref 36.0–46.0)
Hemoglobin: 12.4 g/dL (ref 12.0–15.0)
Lymphocytes Relative: 20.3 % (ref 12.0–46.0)
Lymphs Abs: 1.2 10*3/uL (ref 0.7–4.0)
MCHC: 33.7 g/dL (ref 30.0–36.0)
MCV: 95.1 fl (ref 78.0–100.0)
Monocytes Absolute: 0.3 10*3/uL (ref 0.1–1.0)
Monocytes Relative: 4.4 % (ref 3.0–12.0)
Neutro Abs: 4.4 10*3/uL (ref 1.4–7.7)
Neutrophils Relative %: 74.6 % (ref 43.0–77.0)
Platelets: 216 10*3/uL (ref 150.0–400.0)
RBC: 3.85 Mil/uL — ABNORMAL LOW (ref 3.87–5.11)
RDW: 13.9 % (ref 11.5–15.5)
WBC: 6 10*3/uL (ref 4.0–10.5)

## 2021-11-24 LAB — BASIC METABOLIC PANEL
BUN: 14 mg/dL (ref 6–23)
CO2: 28 mEq/L (ref 19–32)
Calcium: 9.9 mg/dL (ref 8.4–10.5)
Chloride: 103 mEq/L (ref 96–112)
Creatinine, Ser: 0.92 mg/dL (ref 0.40–1.20)
GFR: 76.02 mL/min (ref >60.00)
Glucose, Bld: 89 mg/dL (ref 70–99)
Potassium: 4.7 mEq/L (ref 3.5–5.1)
Sodium: 139 mEq/L (ref 135–145)

## 2021-11-24 LAB — LIPID PANEL
Cholesterol: 178 mg/dL (ref 0–200)
HDL: 78.4 mg/dL (ref >39.00)
LDL Cholesterol: 86 mg/dL (ref 0–99)
NonHDL: 99.95
Total CHOL/HDL Ratio: 2
Triglycerides: 68 mg/dL (ref 0.0–149.0)
VLDL: 13.6 mg/dL (ref 0.0–40.0)

## 2021-11-24 LAB — HEPATIC FUNCTION PANEL
ALT: 10 U/L (ref 0–35)
AST: 13 U/L (ref 0–37)
Albumin: 4.8 g/dL (ref 3.5–5.2)
Alkaline Phosphatase: 49 U/L (ref 39–117)
Bilirubin, Direct: 0.2 mg/dL (ref 0.0–0.3)
Total Bilirubin: 1.5 mg/dL — ABNORMAL HIGH (ref 0.2–1.2)
Total Protein: 7.2 g/dL (ref 6.0–8.3)

## 2021-11-24 LAB — B12 AND FOLATE PANEL
Folate: 21.1 ng/mL (ref >5.9)
Vitamin B-12: 300 pg/mL (ref 211–911)

## 2021-11-24 LAB — VITAMIN D 25 HYDROXY (VIT D DEFICIENCY, FRACTURES): VITD: 53.62 ng/mL (ref 30.00–100.00)

## 2021-11-24 LAB — TSH: TSH: 0.66 u[IU]/mL (ref 0.35–5.50)

## 2021-11-24 NOTE — Progress Notes (Signed)
   Subjective:    Patient ID: Darlene Grant, female    DOB: 09-14-1975, 46 y.o.   MRN: [id number]  HPI CPE- UTD on pap, Tdap.  Too young for colonoscopy  Patient Care Team    Relationship Specialty Notifications Start End  Midge Minium, MD PCP - General Family Medicine  04/20/20   Rolm Bookbinder, MD Consulting Physician General Surgery  05/10/18   Gery Pray, MD Consulting Physician Radiation Oncology  05/10/18   Emily Filbert, MD (Inactive) Consulting Physician Obstetrics and Gynecology  05/15/18   Lavonna Monarch, MD Consulting Physician Dermatology  05/15/18   Mauro Kaufmann, RN Oncology Nurse Navigator   10/04/18   Rockwell Germany, RN Oncology Nurse Navigator   10/04/18   Renelda Loma, OD  Optometry  01/15/20   Nicholas Lose, MD Consulting Physician Hematology and Oncology  04/20/20     Health Maintenance  Topic Date Due  . COVID-19 Vaccine (5 - Booster for Pfizer series) 07/07/2021  . INFLUENZA VACCINE  01/17/2022  . TETANUS/TDAP  02/23/2025  . PAP SMEAR-Modifier  03/14/2026  . HIV Screening  Completed  . HPV VACCINES  Aged Out  . MAMMOGRAM  Discontinued  . Hepatitis C Screening  Discontinued      Review of Systems Patient reports no vision/ hearing changes, adenopathy,fever, weight change,  persistant/recurrent hoarseness , swallowing issues, chest pain, palpitations, edema, persistant/recurrent cough, hemoptysis, dyspnea (rest/exertional/paroxysmal nocturnal), gastrointestinal bleeding (melena, rectal bleeding), abdominal pain, significant heartburn, bowel changes, GU symptoms (dysuria, hematuria, incontinence), Gyn symptoms (abnormal  bleeding, pain),  syncope, focal weakness, memory loss, numbness & tingling, skin/hair/nail changes, abnormal bruising or bleeding, anxiety, or depression.     Objective:   Physical Exam        Assessment & Plan:

## 2021-11-24 NOTE — Patient Instructions (Signed)
Follow up in 1 year or as needed We'll notify you of your lab results and make any changes if needed Keep up the good work on healthy diet and regular exercise- you look great!!! Call with any questions or concerns Stay Safe!  Stay Healthy! Have a great summer!!!

## 2021-11-25 ENCOUNTER — Encounter: Payer: Self-pay | Admitting: Family Medicine

## 2021-11-25 LAB — IRON,TIBC AND FERRITIN PANEL
%SAT: 40 % (calc) (ref 16–45)
Ferritin: 17 ng/mL (ref 16–232)
Iron: 136 ug/dL (ref 40–190)
TIBC: 341 mcg/dL (calc) (ref 250–450)

## 2021-11-25 NOTE — Progress Notes (Signed)
Pt seen results VIA my chart  

## 2021-11-27 NOTE — Assessment & Plan Note (Signed)
Pt's PE WNL.  UTD on pap, Tdap.  No need for mammo due to bilateral mastectomy  Check labs.  Anticipatory guidance provided.

## 2021-11-27 NOTE — Assessment & Plan Note (Signed)
Pt reports fatigue since having COVID.  Check labs to r/o metabolic cause or vitamin deficiency

## 2021-11-29 ENCOUNTER — Encounter: Payer: Self-pay | Admitting: Family Medicine

## 2021-11-29 ENCOUNTER — Telehealth: Payer: Self-pay

## 2021-11-29 NOTE — Telephone Encounter (Signed)
Wellness form faxed copy placed for scan and original in file cabinet up front

## 2021-12-14 ENCOUNTER — Other Ambulatory Visit: Payer: Self-pay | Admitting: Family Medicine

## 2021-12-14 DIAGNOSIS — D2372 Other benign neoplasm of skin of left lower limb, including hip: Secondary | ICD-10-CM | POA: Diagnosis not present

## 2021-12-14 DIAGNOSIS — D235 Other benign neoplasm of skin of trunk: Secondary | ICD-10-CM | POA: Diagnosis not present

## 2021-12-14 DIAGNOSIS — L814 Other melanin hyperpigmentation: Secondary | ICD-10-CM | POA: Diagnosis not present

## 2021-12-14 DIAGNOSIS — D225 Melanocytic nevi of trunk: Secondary | ICD-10-CM | POA: Diagnosis not present

## 2021-12-15 DIAGNOSIS — C50411 Malignant neoplasm of upper-outer quadrant of right female breast: Secondary | ICD-10-CM | POA: Diagnosis not present

## 2021-12-15 DIAGNOSIS — Z171 Estrogen receptor negative status [ER-]: Secondary | ICD-10-CM | POA: Diagnosis not present

## 2021-12-23 ENCOUNTER — Encounter: Payer: Self-pay | Admitting: Family Medicine

## 2021-12-23 NOTE — Telephone Encounter (Signed)
Form printed and I will ask DR Carlota Raspberry or Richard to sign it since DR Birdie Riddle is out of office .

## 2022-02-13 ENCOUNTER — Ambulatory Visit (INDEPENDENT_AMBULATORY_CARE_PROVIDER_SITE_OTHER): Payer: BC Managed Care – PPO | Admitting: Obstetrics & Gynecology

## 2022-02-13 ENCOUNTER — Encounter: Payer: Self-pay | Admitting: Obstetrics & Gynecology

## 2022-02-13 VITALS — BP 124/80 | HR 80 | Resp 16 | Ht 64.5 in | Wt 149.0 lb

## 2022-02-13 DIAGNOSIS — D500 Iron deficiency anemia secondary to blood loss (chronic): Secondary | ICD-10-CM | POA: Diagnosis not present

## 2022-02-13 DIAGNOSIS — Z23 Encounter for immunization: Secondary | ICD-10-CM

## 2022-02-13 DIAGNOSIS — C50411 Malignant neoplasm of upper-outer quadrant of right female breast: Secondary | ICD-10-CM

## 2022-02-13 DIAGNOSIS — N926 Irregular menstruation, unspecified: Secondary | ICD-10-CM | POA: Diagnosis not present

## 2022-02-13 DIAGNOSIS — Z171 Estrogen receptor negative status [ER-]: Secondary | ICD-10-CM

## 2022-02-13 DIAGNOSIS — Z01419 Encounter for gynecological examination (general) (routine) without abnormal findings: Secondary | ICD-10-CM | POA: Diagnosis not present

## 2022-02-13 NOTE — Progress Notes (Signed)
Subjective:     Darlene Grant is a 46 y.o. female here for a routine exam.  Current complaints: period retuned in June--very heavy lasting 7 days; August 7th and heavy 3-4 days and lasted 7 total.  She would like her cbc checked again given te heaviness of her cycles.   Gynecologic History Patient's last menstrual period was 01/23/2022. Contraception: vasectomy Last Pap: 9/22. Results were: normal Last mammogram: s/p bilateral mastectomies for Breast Cancer  Obstetric History OB History  Gravida Para Term Preterm AB Living  '2 2 2     2  '$ SAB IAB Ectopic Multiple Live Births        0 2    # Outcome Date GA Lbr Len/2nd Weight Sex Delivery Anes PTL Lv  2 Term 06/23/16 [redacted]w[redacted]d 8 lb 14.5 oz (4.04 kg) F CS-LTranv Spinal  LIV  1 Term 11/21/13 [id number] lb 4.8 oz (4.218 kg) M CS-LTranv EPI  LIV     The following portions of the patient's history were reviewed and updated as appropriate: allergies, current medications, past family history, past medical history, past social history, past surgical history, and problem list.  Review of Systems Pertinent items noted in HPI and remainder of comprehensive ROS otherwise negative.    Objective:     Vitals:   02/13/22 1009  BP: 124/80  Pulse: 80  Resp: 16  Weight: 149 lb (67.6 kg)  Height: 5' 4.5" (1.638 m)   Vitals:  WNL General appearance: alert, cooperative and no distress  HEENT: Normocephalic, without obvious abnormality, atraumatic Eyes: negative Throat: lips, mucosa, and tongue normal; teeth and gums normal  Respiratory: Clear to auscultation bilaterally  CV: Regular rate and rhythm  Breasts:  Normal appearance, no masses or tenderness, no nipple retraction or dimpling  GI: Soft, non-tender; bowel sounds normal; no masses,  no organomegaly  GU: External Genitalia:  Tanner V, no lesion Urethra:  No prolapse   Vagina: Pink, normal rugae, no blood or discharge  Cervix: No CMT, no lesion  Uterus:  Normal size and  contour, non tender  Adnexa: Normal, no masses, non tender  Musculoskeletal: No edema, redness or tenderness in the calves or thighs  Skin: No lesions or rash  Lymphatic: Axillary adenopathy: none     Psychiatric: Normal mood and behavior   Assessment:    Healthy female exam.    Plan:   Menstrual cycles have returned post treatment.  Check FSH, CBC and ferritin. Flu shot today. Will base treatments on results.

## 2022-02-14 LAB — CBC
HCT: 37.2 % (ref 35.0–45.0)
Hemoglobin: 12.4 g/dL (ref 11.7–15.5)
MCH: 32.8 pg (ref 27.0–33.0)
MCHC: 33.3 g/dL (ref 32.0–36.0)
MCV: 98.4 fL (ref 80.0–100.0)
MPV: 10.6 fL (ref 7.5–12.5)
Platelets: 215 10*3/uL (ref 140–400)
RBC: 3.78 10*6/uL — ABNORMAL LOW (ref 3.80–5.10)
RDW: 12.1 % (ref 11.0–15.0)
WBC: 4.5 10*3/uL (ref 3.8–10.8)

## 2022-02-14 LAB — FOLLICLE STIMULATING HORMONE: FSH: 11.3 m[IU]/mL

## 2022-02-14 LAB — FERRITIN: Ferritin: 19 ng/mL (ref 16–232)

## 2022-02-18 MED ORDER — FERROUS SULFATE 325 (65 FE) MG PO TABS: ORAL_TABLET | ORAL | 3 refills | Status: DC

## 2022-02-22 ENCOUNTER — Telehealth: Payer: Self-pay | Admitting: *Deleted

## 2022-02-22 NOTE — Telephone Encounter (Signed)
Left patient a message to call the office. Schedule with Dr. Damita Dunnings if she would like a consultation for an ablation per Dr. Gala Romney.

## 2022-03-14 ENCOUNTER — Encounter: Payer: Self-pay | Admitting: Family Medicine

## 2022-05-02 DIAGNOSIS — L905 Scar conditions and fibrosis of skin: Secondary | ICD-10-CM | POA: Diagnosis not present

## 2022-05-02 DIAGNOSIS — D485 Neoplasm of uncertain behavior of skin: Secondary | ICD-10-CM | POA: Diagnosis not present

## 2022-05-02 DIAGNOSIS — L739 Follicular disorder, unspecified: Secondary | ICD-10-CM | POA: Diagnosis not present

## 2022-06-02 NOTE — Progress Notes (Signed)
Patient Care Team: Midge Minium, MD as PCP - General (Family Medicine) Rolm Bookbinder, MD as Consulting Physician (General Surgery) Gery Pray, MD as Consulting Physician (Radiation Oncology) Emily Filbert, MD as Consulting Physician (Obstetrics and Gynecology) Lavonna Monarch, MD (Inactive) as Consulting Physician (Dermatology) Mauro Kaufmann, RN as Oncology Nurse Navigator Rockwell Germany, RN as Oncology Nurse Navigator Cabin John, Amy V, OD (Optometry) Nicholas Lose, MD as Consulting Physician (Hematology and Oncology)  DIAGNOSIS: No diagnosis found.  SUMMARY OF ONCOLOGIC HISTORY: Oncology History  Malignant neoplasm of upper-outer quadrant of right breast in female, estrogen receptor negative ([place])  05/03/2018 Initial Diagnosis   right breast upper outer quadrant and right axillary lymph node biopsy 05/03/2018 for a clinical T2 N2, stage IIIC invasive ductal carcinoma, triple negative, with an Ki-67 of 75%   06/03/2018 - 10/18/2018 Neo-Adjuvant Chemotherapy   Dose dense Adriamycin and Cytoxan followed by Taxol and carboplatin x12   11/14/2018 Surgery   Bilateral mastectomies: Left mastectomy: Fibrocystic changes, right mastectomy: Complete pathologic response no evidence of residual cancer, 0/13 lymph nodes negative   12/31/2018 - 02/10/2019 Radiation Therapy   Adjuvant XRT     CHIEF COMPLIANT:   INTERVAL HISTORY: Darlene Grant is a   ALLERGIES:  is allergic to amoxicillin-pot clavulanate, hydrocodone, and keflex [cephalexin].  MEDICATIONS:  Current Outpatient Medications  Medication Sig Dispense Refill   B Complex-C (B-COMPLEX WITH VITAMIN C) tablet Take 1 tablet by mouth daily as needed.      Calcium Acetate-Magnesium Carb 450-200 MG TABS Take by mouth.     Cholecalciferol (VITAMIN D) 125 MCG (5000 UT) CAPS Take 5,000 Units by mouth daily. 30 capsule    cyclobenzaprine (FLEXERIL) 10 MG tablet Take 1 tablet (10 mg total) by mouth as needed for  muscle spasms. 1/2 to I tab TID 30 tablet 2   ferrous sulfate 325 (65 FE) MG tablet Take one tablet every OTHER morning with an acidic juice 30 tablet 3   fluticasone (FLONASE) 50 MCG/ACT nasal spray USE 2 SPRAYS IN EACH NOSTRIL DAILY 48 g 3   hydrocortisone 2.5 % cream Apply topically as needed. 30 g 2   loratadine (CLARITIN) 10 MG tablet Take 10 mg by mouth daily as needed for allergies.     Multiple Vitamins-Minerals (MULTIVITAMIN WITH MINERALS) tablet Take 1 tablet by mouth daily.     Probiotic Product (PROBIOTIC DAILY PO) Take 1 tablet by mouth.     Turmeric 500 MG CAPS Take 1 capsule by mouth daily.     No current facility-administered medications for this visit.    PHYSICAL EXAMINATION: ECOG PERFORMANCE STATUS: {CHL ONC ECOG PS:[id number]}  There were no vitals filed for this visit. There were no vitals filed for this visit.  BREAST:*** No palpable masses or nodules in either right or left breasts. No palpable axillary supraclavicular or infraclavicular adenopathy no breast tenderness or nipple discharge. (exam performed in the presence of a chaperone)  LABORATORY DATA:  I have reviewed the data as listed    Latest Ref Rng & Units 11/24/2021   11:45 AM 11/10/2020   10:38 AM 05/07/2020    9:53 AM  CMP  Glucose 70 - 99 mg/dL 89  96  87   BUN 6 - 23 mg/dL _0 Creatinine 0.40 - 1.20 mg/dL 0.92  0.72  0.62   Sodium 135 - 145 mEq/L 139  139  140   Potassium 3.5 - 5.1 mEq/L 4.7  3.8  3.9   Chloride 96 - 112 mEq/L 103  102  103   CO2 19 - 32 mEq/L _0 Calcium 8.4 - 10.5 mg/dL 9.9  10.1  9.3   Total Protein 6.0 - 8.3 g/dL 7.2  7.3  6.4   Total Bilirubin 0.2 - 1.2 mg/dL 1.5  1.2  1.4   Alkaline Phos 39 - 117 U/L 49  59  54   AST 0 - 37 U/L _1 ALT 0 - 35 U/L _2 Lab Results  Component Value Date   WBC 4.5 02/13/2022   HGB 12.4 02/13/2022   HCT 37.2 02/13/2022   MCV 98.4 02/13/2022   PLT 215 02/13/2022   NEUTROABS 4.4 11/24/2021     ASSESSMENT & PLAN:  No problem-specific Assessment & Plan notes found for this encounter.    No orders of the defined types were placed in this encounter.  The patient has a good understanding of the overall plan. she agrees with it. she will call with any problems that may develop before the next visit here. Total time spent: 30 mins including face to face time and time spent for planning, charting and co-ordination of care   Suzzette Righter, [place] 06/02/22    I Gardiner Coins am acting as a Education administrator for [employer]  ***

## 2022-06-05 ENCOUNTER — Inpatient Hospital Stay: Payer: BC Managed Care – PPO | Attending: Hematology and Oncology | Admitting: Hematology and Oncology

## 2022-06-05 VITALS — BP 139/84 | HR 102 | Temp 97.2°F | Resp 18 | Ht 64.5 in

## 2022-06-05 DIAGNOSIS — Z08 Encounter for follow-up examination after completed treatment for malignant neoplasm: Secondary | ICD-10-CM | POA: Diagnosis not present

## 2022-06-05 DIAGNOSIS — C50411 Malignant neoplasm of upper-outer quadrant of right female breast: Secondary | ICD-10-CM | POA: Diagnosis not present

## 2022-06-05 DIAGNOSIS — Z171 Estrogen receptor negative status [ER-]: Secondary | ICD-10-CM | POA: Diagnosis not present

## 2022-06-05 DIAGNOSIS — Z853 Personal history of malignant neoplasm of breast: Secondary | ICD-10-CM | POA: Insufficient documentation

## 2022-06-05 NOTE — Assessment & Plan Note (Addendum)
05/03/2018: right breast upper outer quadrant and right axillary lymph node biopsy 05/03/2018 for a clinical T2 N2, stage IIIC invasive ductal carcinoma, triple negative, with an Ki-67 75%, no deleterious genetic mutations   06/03/2018-10/18/2018: Neoadjuvant chemotherapy with dose dense Adriamycin and Cytoxan followed by Taxol and carboplatin   11/14/2018: Bilateral mastectomies: Left mastectomy: Fibrocystic changes, right mastectomy: Complete pathologic response no evidence of residual cancer, 0/13 lymph nodes negative Completed adjuvant radiation therapy ---------------------------------------------------------------------------------------------------------------------------------------------------------------  Treatment plan: Surveillance November 2020: CT CAP, CTneck, CT head: No evidence of metastatic disease.  Soft tissue edema in the right axilla 05/14/2019: Biopsy right axilla: Fibrosis no malignancy. Bone scan 05/20/2019: No evidence of bone metastases.   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 Breast/chest wall examination: 06/05/2022: Benign, no palpable lumps or nodules.  She has minor keloid on the left chest wall. 2. no role of imaging studies because she had bilateral mastectomies.   I recommended Signatera for minimal residual disease detection.  Return to clinic in 1 year for follow-up

## 2022-06-21 ENCOUNTER — Other Ambulatory Visit: Payer: Self-pay

## 2022-06-21 DIAGNOSIS — Z171 Estrogen receptor negative status [ER-]: Secondary | ICD-10-CM

## 2022-07-12 ENCOUNTER — Other Ambulatory Visit: Payer: Self-pay

## 2022-07-12 DIAGNOSIS — Z171 Estrogen receptor negative status [ER-]: Secondary | ICD-10-CM

## 2022-07-12 NOTE — Progress Notes (Signed)
Per md orders entered for signatera. Requisition and all supported documents faxed to [phone]. Faxed confirmation was received.  

## 2022-07-13 DIAGNOSIS — Z171 Estrogen receptor negative status [ER-]: Secondary | ICD-10-CM | POA: Diagnosis not present

## 2022-07-13 DIAGNOSIS — C50411 Malignant neoplasm of upper-outer quadrant of right female breast: Secondary | ICD-10-CM | POA: Diagnosis not present

## 2022-08-01 ENCOUNTER — Telehealth: Payer: Self-pay | Admitting: *Deleted

## 2022-08-01 LAB — SIGNATERA ONLY (NATERA MANAGED)
SIGNATERA MTM READOUT: 0 MTM/ml
SIGNATERA TEST RESULT: NEGATIVE

## 2022-08-01 NOTE — Telephone Encounter (Signed)
Per MD request, RN placed call to pt regarding negative (Not Detected) recent [employer] testing.  Pt appreciative of call and verbalized understanding.  

## 2022-09-20 DIAGNOSIS — C50411 Malignant neoplasm of upper-outer quadrant of right female breast: Secondary | ICD-10-CM | POA: Diagnosis not present

## 2022-09-20 DIAGNOSIS — Z171 Estrogen receptor negative status [ER-]: Secondary | ICD-10-CM | POA: Diagnosis not present

## 2022-09-26 LAB — SIGNATERA
SIGNATERA MTM READOUT: 0 MTM/ml
SIGNATERA TEST RESULT: NEGATIVE

## 2022-09-27 ENCOUNTER — Telehealth: Payer: Self-pay

## 2022-09-27 NOTE — Telephone Encounter (Signed)
Called pt per MD to advise [facility] testing was negative/not detected. Pt verbalized understanding of results and knows [facility] will be in touch to schedule 3 mo repeat lab.   

## 2022-11-27 ENCOUNTER — Ambulatory Visit (INDEPENDENT_AMBULATORY_CARE_PROVIDER_SITE_OTHER): Payer: BC Managed Care – PPO | Admitting: Family Medicine

## 2022-11-27 ENCOUNTER — Encounter: Payer: Self-pay | Admitting: Family Medicine

## 2022-11-27 VITALS — BP 120/76 | HR 75 | Temp 98.1°F | Resp 17 | Ht 64.5 in | Wt 151.5 lb

## 2022-11-27 DIAGNOSIS — Z1211 Encounter for screening for malignant neoplasm of colon: Secondary | ICD-10-CM | POA: Diagnosis not present

## 2022-11-27 DIAGNOSIS — Z0000 Encounter for general adult medical examination without abnormal findings: Secondary | ICD-10-CM | POA: Diagnosis not present

## 2022-11-27 DIAGNOSIS — R5383 Other fatigue: Secondary | ICD-10-CM | POA: Diagnosis not present

## 2022-11-27 DIAGNOSIS — E663 Overweight: Secondary | ICD-10-CM

## 2022-11-27 NOTE — Assessment & Plan Note (Signed)
May be stress related, but will check labs to assess for underlying metabolic causes.

## 2022-11-27 NOTE — Progress Notes (Signed)
   Subjective:    Patient ID: Darlene Grant, female    DOB: 12-17-1975, 47 y.o.   MRN: [id number]  HPI CPE- UTD on Tdap, pap.  No need for mammo due to bilateral mastectomy.  Patient Care Team    Relationship Specialty Notifications Start End  Sheliah Hatch, MD PCP - General Family Medicine  04/20/20   Emelia Loron, MD Consulting Physician General Surgery  05/10/18   Antony Blackbird, MD Consulting Physician Radiation Oncology  05/10/18   Allie Bossier, MD Consulting Physician Obstetrics and Gynecology  05/15/18   Janalyn Harder, MD (Inactive) Consulting Physician Dermatology  05/15/18   Pershing Proud, RN Oncology Nurse Navigator   10/04/18   Donnelly Angelica, RN Oncology Nurse Navigator   10/04/18   Tanya Nones, OD  Optometry  01/15/20   Serena Croissant, MD Consulting Physician Hematology and Oncology  04/20/20     Health Maintenance  Topic Date Due   INFLUENZA VACCINE  01/18/2023   DTaP/Tdap/Td (2 - Td or Tdap) 02/23/2025   PAP SMEAR-Modifier  03/14/2026   HIV Screening  Completed   HPV VACCINES  Aged Out   MAMMOGRAM  Discontinued   COVID-19 Vaccine  Discontinued   Hepatitis C Screening  Discontinued      Review of Systems Patient reports no vision/ hearing changes, adenopathy,fever, weight change,  persistant/recurrent hoarseness , swallowing issues, chest pain, palpitations, edema, persistant/recurrent cough, hemoptysis, dyspnea (rest/exertional/paroxysmal nocturnal), gastrointestinal bleeding (melena, rectal bleeding), abdominal pain, significant heartburn, bowel changes, GU symptoms (dysuria, hematuria, incontinence), Gyn symptoms (abnormal  bleeding, pain),  syncope, focal weakness, memory loss, numbness & tingling, skin/hair/nail changes, abnormal bruising or bleeding, anxiety, or depression.     Objective:   Physical Exam General Appearance:    Alert, cooperative, no distress, appears stated age  Head:    Normocephalic, without obvious abnormality,  atraumatic  Eyes:    PERRL, conjunctiva/corneas clear, EOM's intact both eyes  Ears:    Normal TM's and external ear canals, both ears  Nose:   Nares normal, septum midline, mucosa normal, no drainage    or sinus tenderness  Throat:   Lips, mucosa, and tongue normal; teeth and gums normal  Neck:   Supple, symmetrical, trachea midline, no adenopathy;    Thyroid: no enlargement/tenderness/nodules  Back:     Symmetric, no curvature, ROM normal, no CVA tenderness  Lungs:     Clear to auscultation bilaterally, respirations unlabored  Chest Wall:    No tenderness or deformity   Heart:    Regular rate and rhythm, S1 and S2 normal, no murmur, rub   or gallop  Breast Exam:    Deferred  Abdomen:     Soft, non-tender, bowel sounds active all four quadrants,    no masses, no organomegaly  Genitalia:    Deferred to GYN  Rectal:    Extremities:   Extremities normal, atraumatic, no cyanosis  Pulses:   2+ and symmetric all extremities  Skin:   Skin color, texture, turgor normal, no rashes or lesions  Lymph nodes:   Cervical, supraclavicular, and axillary nodes normal  Neurologic:   CNII-XII intact, normal strength, sensation and reflexes    throughout          Assessment & Plan:

## 2022-11-27 NOTE — Patient Instructions (Signed)
Follow up in 1 year or as needed We'll notify you of your lab results and make any changes if needed Keep up the good work!  You look great! Call with any questions or concerns Stay Safe!  Stay Healthy! Have a great summer!! 

## 2022-11-27 NOTE — Assessment & Plan Note (Signed)
Pt's PE WNL.  UTD on Tdap, pap.  No need for mammo due to bilateral mastectomy.  Will refer to GI for colonoscopy as she turns 47 later this month.  Check labs.  Anticipatory guidance provided.

## 2022-11-28 LAB — BASIC METABOLIC PANEL
BUN: 17 mg/dL (ref 6–23)
CO2: 32 mEq/L (ref 19–32)
Calcium: 10.2 mg/dL (ref 8.4–10.5)
Chloride: 98 mEq/L (ref 96–112)
Creatinine, Ser: 0.74 mg/dL (ref 0.40–1.20)
GFR: 98.02 mL/min (ref >60.00)
Glucose, Bld: 89 mg/dL (ref 70–99)
Potassium: 5 mEq/L (ref 3.5–5.1)
Sodium: 138 mEq/L (ref 135–145)

## 2022-11-28 LAB — HEPATIC FUNCTION PANEL
ALT: 11 U/L (ref 0–35)
AST: 13 U/L (ref 0–37)
Albumin: 5 g/dL (ref 3.5–5.2)
Alkaline Phosphatase: 64 U/L (ref 39–117)
Bilirubin, Direct: 0.2 mg/dL (ref 0.0–0.3)
Total Bilirubin: 1.2 mg/dL (ref 0.2–1.2)
Total Protein: 7.4 g/dL (ref 6.0–8.3)

## 2022-11-28 LAB — LUTEINIZING HORMONE: LH: 40.87 m[IU]/mL

## 2022-11-28 LAB — CBC WITH DIFFERENTIAL/PLATELET
Basophils Absolute: 0 10*3/uL (ref 0.0–0.1)
Basophils Relative: 0.6 % (ref 0.0–3.0)
Eosinophils Absolute: 0 10*3/uL (ref 0.0–0.7)
Eosinophils Relative: 0.8 % (ref 0.0–5.0)
HCT: 40.1 % (ref 36.0–46.0)
Hemoglobin: 13.3 g/dL (ref 12.0–15.0)
Lymphocytes Relative: 21.1 % (ref 12.0–46.0)
Lymphs Abs: 1.1 10*3/uL (ref 0.7–4.0)
MCHC: 33.2 g/dL (ref 30.0–36.0)
MCV: 94.7 fl (ref 78.0–100.0)
Monocytes Absolute: 0.3 10*3/uL (ref 0.1–1.0)
Monocytes Relative: 6.1 % (ref 3.0–12.0)
Neutro Abs: 3.7 10*3/uL (ref 1.4–7.7)
Neutrophils Relative %: 71.4 % (ref 43.0–77.0)
Platelets: 225 10*3/uL (ref 150.0–400.0)
RBC: 4.24 Mil/uL (ref 3.87–5.11)
RDW: 13.3 % (ref 11.5–15.5)
WBC: 5.2 10*3/uL (ref 4.0–10.5)

## 2022-11-28 LAB — IRON,TIBC AND FERRITIN PANEL
%SAT: 32 % (calc) (ref 16–45)
Ferritin: 14 ng/mL — ABNORMAL LOW (ref 16–232)
Iron: 117 ug/dL (ref 40–190)
TIBC: 369 mcg/dL (calc) (ref 250–450)

## 2022-11-28 LAB — LIPID PANEL
Cholesterol: 190 mg/dL (ref 0–200)
HDL: 80.6 mg/dL (ref >39.00)
LDL Cholesterol: 85 mg/dL (ref 0–99)
NonHDL: 109.81
Total CHOL/HDL Ratio: 2
Triglycerides: 125 mg/dL (ref 0.0–149.0)
VLDL: 25 mg/dL (ref 0.0–40.0)

## 2022-11-28 LAB — VITAMIN D 25 HYDROXY (VIT D DEFICIENCY, FRACTURES): VITD: 69.06 ng/mL (ref 30.00–100.00)

## 2022-11-28 LAB — B12 AND FOLATE PANEL
Folate: >23.8 ng/mL (ref >5.9)
Vitamin B-12: 311 pg/mL (ref 211–911)

## 2022-11-28 LAB — TSH: TSH: 0.79 u[IU]/mL (ref 0.35–5.50)

## 2022-11-28 LAB — FOLLICLE STIMULATING HORMONE: FSH: 43.9 m[IU]/mL

## 2022-11-29 ENCOUNTER — Telehealth: Payer: Self-pay

## 2022-11-29 ENCOUNTER — Encounter: Payer: Self-pay | Admitting: Family Medicine

## 2022-11-29 NOTE — Telephone Encounter (Signed)
Pt is aware of lab results.

## 2022-11-29 NOTE — Telephone Encounter (Signed)
-----   Message from Sheliah Hatch, MD sent at 11/29/2022  7:34 AM EDT ----- Labs look great!  Your FSH and LH indicate that you are menopausal

## 2022-12-13 ENCOUNTER — Encounter: Payer: Self-pay | Admitting: Family Medicine

## 2022-12-13 DIAGNOSIS — D225 Melanocytic nevi of trunk: Secondary | ICD-10-CM | POA: Diagnosis not present

## 2022-12-13 DIAGNOSIS — Z0000 Encounter for general adult medical examination without abnormal findings: Secondary | ICD-10-CM

## 2022-12-13 DIAGNOSIS — L821 Other seborrheic keratosis: Secondary | ICD-10-CM | POA: Diagnosis not present

## 2022-12-13 DIAGNOSIS — D235 Other benign neoplasm of skin of trunk: Secondary | ICD-10-CM | POA: Diagnosis not present

## 2022-12-13 DIAGNOSIS — L814 Other melanin hyperpigmentation: Secondary | ICD-10-CM | POA: Diagnosis not present

## 2022-12-13 MED ORDER — HYDROCORTISONE 2.5 % EX CREA: TOPICAL_CREAM | CUTANEOUS | 2 refills | Status: DC | PRN

## 2022-12-13 MED ORDER — CYCLOBENZAPRINE HCL 10 MG PO TABS
10.0000 mg | ORAL_TABLET | ORAL | 2 refills | Status: DC | PRN
Start: 2022-12-13 — End: 2022-12-14

## 2022-12-14 MED ORDER — HYDROCORTISONE 2.5 % EX CREA
TOPICAL_CREAM | CUTANEOUS | 2 refills | Status: AC | PRN
Start: 2022-12-14 — End: ?

## 2022-12-14 MED ORDER — CYCLOBENZAPRINE HCL 10 MG PO TABS
10.0000 mg | ORAL_TABLET | ORAL | 2 refills | Status: AC | PRN
Start: 2022-12-14 — End: ?

## 2022-12-14 NOTE — Addendum Note (Signed)
Addended by: Sheliah Hatch on: 12/14/2022 08:38 AM   Modules accepted: Orders

## 2023-01-03 LAB — SIGNATERA
SIGNATERA MTM READOUT: 0 MTM/ml
SIGNATERA TEST RESULT: NEGATIVE

## 2023-01-08 NOTE — Telephone Encounter (Signed)
Attempted to call pt regarding signatera results lvm for pt that results was negative and [facility] will be in contact with them in the next 3 months to repeat labs.

## 2023-01-15 DIAGNOSIS — C50411 Malignant neoplasm of upper-outer quadrant of right female breast: Secondary | ICD-10-CM | POA: Diagnosis not present

## 2023-01-15 DIAGNOSIS — Z171 Estrogen receptor negative status [ER-]: Secondary | ICD-10-CM | POA: Diagnosis not present

## 2023-03-05 ENCOUNTER — Encounter: Payer: Self-pay | Admitting: Family Medicine

## 2023-03-05 ENCOUNTER — Ambulatory Visit (INDEPENDENT_AMBULATORY_CARE_PROVIDER_SITE_OTHER): Payer: BC Managed Care – PPO | Admitting: Family Medicine

## 2023-03-05 VITALS — BP 135/88 | HR 85 | Resp 16 | Ht 64.5 in | Wt 151.0 lb

## 2023-03-05 DIAGNOSIS — N951 Menopausal and female climacteric states: Secondary | ICD-10-CM | POA: Diagnosis not present

## 2023-03-05 DIAGNOSIS — Z23 Encounter for immunization: Secondary | ICD-10-CM | POA: Diagnosis not present

## 2023-03-05 DIAGNOSIS — Z01419 Encounter for gynecological examination (general) (routine) without abnormal findings: Secondary | ICD-10-CM | POA: Diagnosis not present

## 2023-03-05 NOTE — Progress Notes (Signed)
Subjective:     Darlene Grant is a 47 y.o. female and is here for a comprehensive physical exam. The patient reports no problems. Has had some dark brown spotting, and March was LMP, some brown spotting. Has had FSH showing she was menopausal, post chemo, but most recently not and having monthly spotting, which seems regular.   The following portions of the patient's history were reviewed and updated as appropriate: allergies, current medications, past family history, past medical history, past social history, past surgical history, and problem list.  Review of Systems Pertinent items noted in HPI and remainder of comprehensive ROS otherwise negative.   Objective:    BP 135/88   Pulse 85   Resp 16   Ht 5' 4.5" (1.638 m)   Wt 151 lb (68.5 kg)   BMI 25.52 kg/m  General appearance: alert, cooperative, and appears stated age Head: Normocephalic, without obvious abnormality, atraumatic Neck: no adenopathy, supple, symmetrical, trachea midline, and thyroid not enlarged, symmetric, no tenderness/mass/nodules Lungs: clear to auscultation bilaterally Chest:  chest wall without mass Heart: regular rate and rhythm, S1, S2 normal, no murmur, click, rub or gallop Abdomen: soft, non-tender; bowel sounds normal; no masses,  no organomegaly Pelvic: cervix normal in appearance, external genitalia normal, no adnexal masses or tenderness, no cervical motion tenderness, uterus normal size, shape, and consistency, and vagina normal without discharge Extremities: extremities normal, atraumatic, no cyanosis or edema Pulses: 2+ and symmetric Skin: Skin color, texture, turgor normal. No rashes or lesions Lymph nodes: Cervical, supraclavicular, and axillary nodes normal. Neurologic: Grossly normal    Assessment:    Healthy female exam.      Plan:  Needs flu shot - Plan: Flu vaccine trivalent PF, 6mos and older(Flulaval,Afluria,Fluarix,Fluzone)  Encounter for gynecological examination  without abnormal finding  Perimenopausal - Plan: Follicle stimulating hormone Pap due next year.   See After Visit Summary for Counseling Recommendations

## 2023-03-06 ENCOUNTER — Encounter: Payer: Self-pay | Admitting: Family Medicine

## 2023-03-06 ENCOUNTER — Other Ambulatory Visit: Payer: Self-pay

## 2023-03-06 DIAGNOSIS — N926 Irregular menstruation, unspecified: Secondary | ICD-10-CM

## 2023-03-06 LAB — FOLLICLE STIMULATING HORMONE: FSH: 71 m[IU]/mL

## 2023-03-06 NOTE — Telephone Encounter (Signed)
Pt is asking about [facility] you recommend, please advise

## 2023-03-08 ENCOUNTER — Ambulatory Visit (INDEPENDENT_AMBULATORY_CARE_PROVIDER_SITE_OTHER): Payer: BC Managed Care – PPO

## 2023-03-08 DIAGNOSIS — N926 Irregular menstruation, unspecified: Secondary | ICD-10-CM | POA: Diagnosis not present

## 2023-03-08 DIAGNOSIS — N858 Other specified noninflammatory disorders of uterus: Secondary | ICD-10-CM | POA: Diagnosis not present

## 2023-03-08 DIAGNOSIS — N939 Abnormal uterine and vaginal bleeding, unspecified: Secondary | ICD-10-CM | POA: Diagnosis not present

## 2023-03-30 ENCOUNTER — Encounter: Payer: Self-pay | Admitting: Family Medicine

## 2023-04-04 MED ORDER — MISOPROSTOL 200 MCG PO TABS: ORAL_TABLET | ORAL | 0 refills | Status: DC

## 2023-04-05 ENCOUNTER — Telehealth: Payer: Self-pay | Admitting: *Deleted

## 2023-04-05 NOTE — Telephone Encounter (Signed)
TC to confirm pt read [facility] message from Dr. Penne Lash regarding need for EMB. Pt confirms receipt and understanding. Pt states she does not plan to take cytotec. "I've had this done before and it will be fine, I don't need it."Call transferred to scheduler.

## 2023-04-09 LAB — SIGNATERA
SIGNATERA MTM READOUT: 0 MTM/ml
SIGNATERA TEST RESULT: NEGATIVE

## 2023-04-30 ENCOUNTER — Other Ambulatory Visit (HOSPITAL_COMMUNITY)
Admission: RE | Admit: 2023-04-30 | Discharge: 2023-04-30 | Disposition: A | Discharge status: Home / Self Care | Payer: BC Managed Care – PPO | Source: Ambulatory Visit | Attending: Obstetrics & Gynecology | Admitting: Obstetrics & Gynecology

## 2023-04-30 ENCOUNTER — Encounter: Payer: Self-pay | Admitting: Obstetrics & Gynecology

## 2023-04-30 ENCOUNTER — Ambulatory Visit: Payer: BC Managed Care – PPO | Admitting: Obstetrics & Gynecology

## 2023-04-30 VITALS — BP 136/75 | HR 99 | Wt 150.0 lb

## 2023-04-30 DIAGNOSIS — R9389 Abnormal findings on diagnostic imaging of other specified body structures: Secondary | ICD-10-CM | POA: Insufficient documentation

## 2023-04-30 NOTE — Progress Notes (Signed)
   Subjective:    Patient ID: Darlene Grant, female    DOB: 29-Oct-1975, 47 y.o.   MRN: [id number]  HPI  Pt has history of post menopausal bleeding and 5 mm lining on [place].   Presents for biopsy  Review of Systems  Constitutional: Negative.   Respiratory: Negative.    Cardiovascular: Negative.   Gastrointestinal: Negative.   Genitourinary:  Positive for vaginal bleeding.       Objective:   Physical Exam Vitals reviewed.  Constitutional:      General: She is not in acute distress.    Appearance: She is well-developed.  HENT:     Head: Normocephalic and atraumatic.  Eyes:     Conjunctiva/sclera: Conjunctivae normal.  Cardiovascular:     Rate and Rhythm: Normal rate.  Pulmonary:     Effort: Pulmonary effort is normal.  Skin:    General: Skin is warm and dry.  Neurological:     Mental Status: She is alert and oriented to person, place, and time.  Psychiatric:        Mood and Affect: Mood normal.    Vitals:   04/30/23 1306  BP: 136/75  Pulse: 99  Weight: 150 lb (68 kg)       Assessment & Plan:   47 yo female with PMB and 5 mm lining on [place]  Explained results of last 2 [place] Endometirail Bx.  ENDOMETRIAL BIOPSY     The indications for endometrial biopsy were reviewed.   Risks of the biopsy including cramping, bleeding, infection, uterine perforation, inadequate specimen and need for additional procedures  were discussed. The patient states she understands and agrees to undergo procedure today. Consent was signed. Time out was performed. Urine HCG was negative. A sterile speculum was placed in the patient's vagina and the cervix was prepped with Betadine. A single-toothed tenaculum was placed on the anterior lip of the cervix to stabilize it. The 3 mm pipelle was introduced into the endometrial cavity without difficulty to a depth of 7.5cm, and a moderate amount of tissue was obtained and sent to pathology. The instruments were removed from the patient's vagina.  Minimal bleeding from the cervix was noted. The patient tolerated the procedure well. Routine post-procedure instructions were given to the patient. The patient will follow up to review the results and for further management.

## 2023-05-02 LAB — SURGICAL PATHOLOGY

## 2023-05-10 NOTE — Telephone Encounter (Signed)
Telephone call  

## 2023-06-04 ENCOUNTER — Inpatient Hospital Stay: Payer: BC Managed Care – PPO | Attending: Hematology and Oncology | Admitting: Hematology and Oncology

## 2023-06-04 VITALS — BP 119/73 | HR 82 | Temp 98.1°F | Resp 18

## 2023-06-04 DIAGNOSIS — Z171 Estrogen receptor negative status [ER-]: Secondary | ICD-10-CM

## 2023-06-04 DIAGNOSIS — Z853 Personal history of malignant neoplasm of breast: Secondary | ICD-10-CM | POA: Insufficient documentation

## 2023-06-04 DIAGNOSIS — Z08 Encounter for follow-up examination after completed treatment for malignant neoplasm: Secondary | ICD-10-CM | POA: Diagnosis not present

## 2023-06-04 DIAGNOSIS — C50411 Malignant neoplasm of upper-outer quadrant of right female breast: Secondary | ICD-10-CM | POA: Diagnosis not present

## 2023-06-04 NOTE — Assessment & Plan Note (Signed)
05/03/2018: right breast upper outer quadrant and right axillary lymph node biopsy 05/03/2018 for a clinical T2 N2, stage IIIC invasive ductal carcinoma, triple negative, with an Ki-67 75%, no deleterious genetic mutations   06/03/2018-10/18/2018: Neoadjuvant chemotherapy with dose dense Adriamycin and Cytoxan followed by Taxol and carboplatin   11/14/2018: Bilateral mastectomies: Left mastectomy: Fibrocystic changes, right mastectomy: Complete pathologic response no evidence of residual cancer, 0/13 lymph nodes negative Completed adjuvant radiation therapy ---------------------------------------------------------------------------------------------------------------------------------------------------------------  Treatment plan: Surveillance November 2020: CT CAP, CTneck, CT head: No evidence of metastatic disease.  Soft tissue edema in the right axilla 05/14/2019: Biopsy right axilla: Fibrosis no malignancy. Bone scan 05/20/2019: No evidence of bone metastases.   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 Breast/chest wall examination: 06/04/2023: Benign, no palpable lumps or nodules.  She has minor keloid on the left chest wall. 2. no role of imaging studies because she had bilateral mastectomies. 3.  Signatera: Negative   Return to clinic in 1 year for follow-up

## 2023-06-04 NOTE — Progress Notes (Signed)
Patient Care Team: Sheliah Hatch, MD as PCP - General (Family Medicine) Emelia Loron, MD as Consulting Physician (General Surgery) Antony Blackbird, MD as Consulting Physician (Radiation Oncology) Janalyn Harder, MD (Inactive) as Consulting Physician (Dermatology) Pershing Proud, RN as Oncology Nurse Navigator Donnelly Angelica, RN as Oncology Nurse Navigator [place], Amy V, OD (Optometry) Serena Croissant, MD as Consulting Physician (Hematology and Oncology)  DIAGNOSIS:  Encounter Diagnosis  Name Primary?   Malignant neoplasm of upper-outer quadrant of right breast in female, estrogen receptor negative (HCC) Yes    SUMMARY OF ONCOLOGIC HISTORY: Oncology History  Malignant neoplasm of upper-outer quadrant of right breast in female, estrogen receptor negative (HCC)  05/03/2018 Initial Diagnosis   right breast upper outer quadrant and right axillary lymph node biopsy 05/03/2018 for a clinical T2 N2, stage IIIC invasive ductal carcinoma, triple negative, with an Ki-67 of 75%   06/03/2018 - 10/18/2018 Neo-Adjuvant Chemotherapy   Dose dense Adriamycin and Cytoxan followed by Taxol and carboplatin x12   11/14/2018 Surgery   Bilateral mastectomies: Left mastectomy: Fibrocystic changes, right mastectomy: Complete pathologic response no evidence of residual cancer, 0/13 lymph nodes negative   12/31/2018 - 02/10/2019 Radiation Therapy   Adjuvant XRT     CHIEF COMPLIANT: Surveillance of breast cancer  HISTORY OF PRESENT ILLNESS:  History of Present Illness   Darlene Grant, a 47-year post-diagnosis cancer survivor, presents with lymphedema in her hand, which she manages with daily wrapping and a sleeve. She describes the lymphedema as moderate and is compliant with compression, exercise, and hydration. She takes various supplements and medications as needed, including turmeric for aches. She has a history of low iron levels and is considering taking a beef liver supplement to address this.  She is currently not on any prescription medications. 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 She has concerns about her iron levels   ALLERGIES:  is allergic to amoxicillin-pot clavulanate, hydrocodone, and keflex [cephalexin].  MEDICATIONS:  Current Outpatient Medications  Medication Sig Dispense Refill   B Complex-C (B-COMPLEX WITH VITAMIN C) tablet Take 1 tablet by mouth daily as needed.  (Patient not taking: Reported on 11/27/2022)     Calcium Acetate-Magnesium Carb 450-200 MG TABS Take by mouth.     Cholecalciferol (VITAMIN D) 125 MCG (5000 UT) CAPS Take 5,000 Units by mouth daily. 30 capsule    cyclobenzaprine (FLEXERIL) 10 MG tablet Take 1 tablet (10 mg total) by mouth as needed for muscle spasms. 1/2 to I tab TID 30 tablet 2   ferrous sulfate 325 (65 FE) MG tablet Take one tablet every OTHER morning with an acidic juice 30 tablet 3   fluticasone (FLONASE) 50 MCG/ACT nasal spray USE 2 SPRAYS IN EACH NOSTRIL DAILY 48 g 3   hydrocortisone 2.5 % cream Apply topically as needed. 30 g 2   loratadine (CLARITIN) 10 MG tablet Take 10 mg by mouth daily as needed for allergies.     Multiple Vitamins-Minerals (MULTIVITAMIN WITH MINERALS) tablet Take 1 tablet by mouth daily.     Probiotic Product (PROBIOTIC DAILY PO) Take 1 tablet by mouth.     Turmeric 500 MG CAPS Take 1 capsule by mouth daily.     No current facility-administered medications for this visit.    PHYSICAL EXAMINATION: ECOG PERFORMANCE STATUS: 1 - Symptomatic but completely ambulatory  Vitals:   06/04/23 1016  BP: 119/73  Pulse: 82  Resp: 18  Temp: 98.1 F (36.7 C)  SpO2:  100%   Filed Weights      LABORATORY DATA:  I have reviewed the data as listed    Latest Ref Rng & Units 11/27/2022   11:29 AM 11/24/2021   11:45 AM 11/10/2020   10:38 AM  CMP  Glucose 70 - 99 mg/dL 89  89  96   BUN 6 - 23 mg/dL 17  14  15    Creatinine 0.40 - 1.20 mg/dL  1.61  0.96  0.45   Sodium 135 - 145 mEq/L 138  139  139   Potassium 3.5 - 5.1 mEq/L 5.0  4.7  3.8   Chloride 96 - 112 mEq/L 98  103  102   CO2 19 - 32 mEq/L 32  28  29   Calcium 8.4 - 10.5 mg/dL 40.9  9.9  81.1   Total Protein 6.0 - 8.3 g/dL 7.4  7.2  7.3   Total Bilirubin 0.2 - 1.2 mg/dL 1.2  1.5  1.2   Alkaline Phos 39 - 117 U/L 64  49  59   AST 0 - 37 U/L 13  13  12    ALT 0 - 35 U/L 11  10  10      Lab Results  Component Value Date   WBC 5.2 11/27/2022   HGB 13.3 11/27/2022   HCT 40.1 11/27/2022   MCV 94.7 11/27/2022   PLT 225.0 11/27/2022   NEUTROABS 3.7 11/27/2022    ASSESSMENT & PLAN:  Malignant neoplasm of upper-outer quadrant of right breast in female, estrogen receptor negative (HCC) 05/03/2018: right breast upper outer quadrant and right axillary lymph node biopsy 05/03/2018 for a clinical T2 N2, stage IIIC invasive ductal carcinoma, triple negative, with an Ki-67 75%, no deleterious genetic mutations   06/03/2018-10/18/2018: Neoadjuvant chemotherapy with dose dense Adriamycin and Cytoxan followed by Taxol and carboplatin   11/14/2018: Bilateral mastectomies: Left mastectomy: Fibrocystic changes, right mastectomy: Complete pathologic response no evidence of residual cancer, 0/13 lymph nodes negative Completed adjuvant radiation therapy ---------------------------------------------------------------------------------------------------------------------------------------------------------------  Treatment plan: Surveillance November 2020: CT CAP, CTneck, CT head: No evidence of metastatic disease.  Soft tissue edema in the right axilla 05/14/2019: Biopsy right axilla: Fibrosis no malignancy. Bone scan 05/20/2019: No evidence of bone metastases.   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  Breast/chest wall examination: 06/04/2023: Benign, no palpable lumps or nodules.  She has minor keloid on the left chest wall. 2. no role of imaging studies because she had bilateral mastectomies. 3.  Signatera: Negative      Iron Deficiency Low ferritin levels, but not severe enough to warrant IV iron therapy. Patient has difficulty tolerating iron supplements. -Consider natural sources of iron such as beetroot juice. -Monitor ferritin levels and consider IV iron therapy if ferritin drops below 10.  Colon Cancer Screening Patient inquiring about colonoscopy vs Cologuard. -Recommend Cologuard if no family history of colon cancer. Consider colonoscopy at age 16.  Breast Cancer Surveillance Patient has been undergoing Signatera blood draws. -Reduce frequency of Signatera blood draws to twice a year. -Continue to monitor for recurrence.  Career Transition Patient considering transitioning to lymphedema therapy. -Encourage patient to explore this career path when the timing feels right.  Follow-up -Schedule next year's appointment.        No orders of the defined types were placed in this encounter.  The  patient has a good understanding of the overall plan. she agrees with it. she will call with any problems that may develop before the next visit here. Total time spent: 30 mins including face to face time and time spent for planning, charting and co-ordination of care   Tamsen Meek, MD 06/04/23

## 2023-06-06 ENCOUNTER — Ambulatory Visit: Payer: BC Managed Care – PPO | Admitting: Hematology and Oncology

## 2023-06-26 ENCOUNTER — Encounter: Payer: Self-pay | Admitting: Family Medicine

## 2023-06-26 DIAGNOSIS — Z1211 Encounter for screening for malignant neoplasm of colon: Secondary | ICD-10-CM

## 2023-06-27 NOTE — Telephone Encounter (Signed)
 Patient is asking if we can order her a cologaurd test notes her Oncologist is comfortable with this.

## 2023-07-12 DIAGNOSIS — Z1211 Encounter for screening for malignant neoplasm of colon: Secondary | ICD-10-CM | POA: Diagnosis not present

## 2023-07-15 ENCOUNTER — Encounter: Payer: Self-pay | Admitting: Obstetrics & Gynecology

## 2023-07-18 ENCOUNTER — Other Ambulatory Visit: Payer: Self-pay | Admitting: Obstetrics & Gynecology

## 2023-07-18 DIAGNOSIS — N951 Menopausal and female climacteric states: Secondary | ICD-10-CM

## 2023-07-19 ENCOUNTER — Other Ambulatory Visit: Payer: Self-pay | Admitting: Obstetrics & Gynecology

## 2023-07-19 ENCOUNTER — Other Ambulatory Visit: Payer: Self-pay

## 2023-07-19 DIAGNOSIS — N951 Menopausal and female climacteric states: Secondary | ICD-10-CM

## 2023-07-20 ENCOUNTER — Encounter: Payer: Self-pay | Admitting: Family Medicine

## 2023-07-20 LAB — ESTRADIOL: Estradiol: 24.5 pg/mL

## 2023-07-20 LAB — FOLLICLE STIMULATING HORMONE: FSH: 66.6 m[IU]/mL

## 2023-07-20 LAB — COLOGUARD: COLOGUARD: NEGATIVE

## 2023-07-23 ENCOUNTER — Telehealth: Payer: Self-pay

## 2023-07-23 NOTE — Telephone Encounter (Signed)
-----   Message from Neena Rhymes sent at 07/20/2023 10:27 PM EST ----- Normal cologuard- great news!

## 2023-07-23 NOTE — Telephone Encounter (Signed)
 Pt has reviewed via MyChart

## 2023-07-24 ENCOUNTER — Encounter: Payer: Self-pay | Admitting: Obstetrics and Gynecology

## 2023-08-15 ENCOUNTER — Other Ambulatory Visit: Payer: Self-pay | Admitting: *Deleted

## 2023-08-15 ENCOUNTER — Encounter: Payer: Self-pay | Admitting: Hematology and Oncology

## 2023-08-15 DIAGNOSIS — C50411 Malignant neoplasm of upper-outer quadrant of right female breast: Secondary | ICD-10-CM

## 2023-08-15 NOTE — Progress Notes (Signed)
 Signatera renewal orders placed.

## 2023-08-21 ENCOUNTER — Encounter: Payer: Self-pay | Admitting: Family Medicine

## 2023-08-21 ENCOUNTER — Ambulatory Visit: Admitting: Family Medicine

## 2023-08-21 VITALS — BP 110/70 | HR 110 | Temp 98.0°F | Ht 64.5 in | Wt 147.4 lb

## 2023-08-21 DIAGNOSIS — J329 Chronic sinusitis, unspecified: Secondary | ICD-10-CM

## 2023-08-21 DIAGNOSIS — B9689 Other specified bacterial agents as the cause of diseases classified elsewhere: Secondary | ICD-10-CM | POA: Diagnosis not present

## 2023-08-21 DIAGNOSIS — R000 Tachycardia, unspecified: Secondary | ICD-10-CM

## 2023-08-21 MED ORDER — ONDANSETRON HCL 4 MG PO TABS: 4.0000 mg | ORAL_TABLET | Freq: Three times a day (TID) | ORAL | 0 refills | Status: DC | PRN

## 2023-08-21 MED ORDER — AMOXICILLIN 875 MG PO TABS: 875.0000 mg | ORAL_TABLET | Freq: Two times a day (BID) | ORAL | 0 refills | Status: DC

## 2023-08-21 NOTE — Progress Notes (Signed)
   Subjective:    Patient ID: Darlene Grant, female    DOB: Jun 16, 1976, 48 y.o.   MRN: [id number]  HPI URI- sxs started 10 days ago as 'a nasty cold'.  + cough, congestion.  Friday started vomiting w/ 'stomach discomfort'.  Now w/ L ear pain and 'i can hear my heart beat in my ear'.  Has severe HA- frontal and L sided behind her eye.   Tachycardia- Sunday night woke w/ 'racing heart'.  Checked BP and it was similar to today's reading of 110/70.  HR varied between 88-96.  Was drinking electrolytes due to vomiting.  Denies CP, SOB.   Review of Systems For ROS see HPI     Objective:   Physical Exam Vitals reviewed.  Constitutional:      General: She is not in acute distress.    Appearance: Normal appearance. She is not ill-appearing.  HENT:     Head: Normocephalic and atraumatic.     Right Ear: Tympanic membrane and ear canal normal.     Left Ear: Tympanic membrane is retracted.     Nose: Congestion present.     Comments: TTP over L frontal and maxillary sinuses, no TTP over R frontal and maxillary sinuses    Mouth/Throat:     Mouth: Mucous membranes are moist.     Pharynx: No oropharyngeal exudate or posterior oropharyngeal erythema.  Eyes:     Extraocular Movements: Extraocular movements intact.     Conjunctiva/sclera: Conjunctivae normal.  Cardiovascular:     Rate and Rhythm: Regular rhythm. Tachycardia present.     Heart sounds: Normal heart sounds.  Pulmonary:     Effort: Pulmonary effort is normal. No respiratory distress.     Breath sounds: No wheezing or rhonchi.  Musculoskeletal:     Cervical back: Neck supple.  Lymphadenopathy:     Cervical: No cervical adenopathy.  Skin:    General: Skin is warm and dry.  Neurological:     General: No focal deficit present.     Mental Status: She is alert and oriented to person, place, and time.  Psychiatric:        Mood and Affect: Mood normal.        Behavior: Behavior normal.        Thought Content: Thought  content normal.           Assessment & Plan:  Bacterial sinusitis- new.  Pt's sxs and PE are consistent w/ dx.  Suspect that sxs initially started w/ viral illness that got secondarily infected.  Start Amoxicillin (pt reports she has not had any issues w/ this previously despite Augmentin intolerance).  Reviewed supportive care and red flags that should prompt return.  Pt expressed understanding and is in agreement w/ plan.   Tachycardia- new.  Reviewed that this is likely due to ongoing illness, mild dehydration.  EKG showed 2 PVCs but otherwise WNL.  Encouraged increased hydration, rest, electrolytes.  Zofran prn.  Pt expressed understanding and is in agreement w/ plan.

## 2023-08-21 NOTE — Patient Instructions (Signed)
 Follow up as needed or as scheduled START the Amoxicillin twice daily- take w/ food USE the Ondansetron as needed for nausea ADD a daily Claritin or Zyrtec to help w/ the ear pressure RESTART Flonase- 2 sprays each nostril daily until feeling better Drink LOTS of fluids REST Call with any questions or concerns Hang in there!

## 2023-08-22 ENCOUNTER — Other Ambulatory Visit: Payer: Self-pay | Admitting: Family Medicine

## 2023-08-22 ENCOUNTER — Encounter: Payer: Self-pay | Admitting: Family Medicine

## 2023-08-22 ENCOUNTER — Other Ambulatory Visit (INDEPENDENT_AMBULATORY_CARE_PROVIDER_SITE_OTHER)

## 2023-08-22 ENCOUNTER — Other Ambulatory Visit

## 2023-08-22 DIAGNOSIS — R000 Tachycardia, unspecified: Secondary | ICD-10-CM

## 2023-08-22 NOTE — Telephone Encounter (Addendum)
 Patient would also like ferritin added is this okay?

## 2023-08-22 NOTE — Progress Notes (Signed)
 Order entered at pt's request

## 2023-08-22 NOTE — Telephone Encounter (Signed)
 Patient is asking if she should have some blood work done and asking how long she should wait before she should notice a difference in HR

## 2023-08-23 ENCOUNTER — Encounter: Payer: Self-pay | Admitting: Hematology and Oncology

## 2023-08-23 ENCOUNTER — Encounter: Payer: Self-pay | Admitting: Family Medicine

## 2023-08-23 ENCOUNTER — Telehealth: Payer: Self-pay

## 2023-08-23 DIAGNOSIS — R1110 Vomiting, unspecified: Secondary | ICD-10-CM | POA: Diagnosis not present

## 2023-08-23 DIAGNOSIS — R1900 Intra-abdominal and pelvic swelling, mass and lump, unspecified site: Secondary | ICD-10-CM | POA: Diagnosis not present

## 2023-08-23 DIAGNOSIS — Z881 Allergy status to other antibiotic agents status: Secondary | ICD-10-CM | POA: Diagnosis not present

## 2023-08-23 DIAGNOSIS — R0602 Shortness of breath: Secondary | ICD-10-CM | POA: Diagnosis not present

## 2023-08-23 DIAGNOSIS — R1904 Left lower quadrant abdominal swelling, mass and lump: Secondary | ICD-10-CM | POA: Diagnosis not present

## 2023-08-23 DIAGNOSIS — Z8619 Personal history of other infectious and parasitic diseases: Secondary | ICD-10-CM | POA: Diagnosis not present

## 2023-08-23 DIAGNOSIS — R933 Abnormal findings on diagnostic imaging of other parts of digestive tract: Secondary | ICD-10-CM | POA: Diagnosis not present

## 2023-08-23 DIAGNOSIS — R000 Tachycardia, unspecified: Secondary | ICD-10-CM | POA: Diagnosis not present

## 2023-08-23 DIAGNOSIS — R799 Abnormal finding of blood chemistry, unspecified: Secondary | ICD-10-CM | POA: Diagnosis not present

## 2023-08-23 DIAGNOSIS — Z8709 Personal history of other diseases of the respiratory system: Secondary | ICD-10-CM | POA: Diagnosis not present

## 2023-08-23 DIAGNOSIS — Z853 Personal history of malignant neoplasm of breast: Secondary | ICD-10-CM | POA: Diagnosis not present

## 2023-08-23 DIAGNOSIS — R9431 Abnormal electrocardiogram [ECG] [EKG]: Secondary | ICD-10-CM | POA: Diagnosis not present

## 2023-08-23 DIAGNOSIS — D649 Anemia, unspecified: Secondary | ICD-10-CM | POA: Diagnosis not present

## 2023-08-23 DIAGNOSIS — R195 Other fecal abnormalities: Secondary | ICD-10-CM | POA: Diagnosis not present

## 2023-08-23 DIAGNOSIS — Z780 Asymptomatic menopausal state: Secondary | ICD-10-CM | POA: Diagnosis not present

## 2023-08-23 LAB — BASIC METABOLIC PANEL
BUN: 10 mg/dL (ref 6–23)
CO2: 30 meq/L (ref 19–32)
Calcium: 9.6 mg/dL (ref 8.4–10.5)
Chloride: 104 meq/L (ref 96–112)
Creatinine, Ser: 0.8 mg/dL (ref 0.40–1.20)
GFR: 88.81 mL/min (ref >60.00)
Glucose, Bld: 141 mg/dL — ABNORMAL HIGH (ref 70–99)
Potassium: 4.6 meq/L (ref 3.5–5.1)
Sodium: 141 meq/L (ref 135–145)

## 2023-08-23 LAB — CBC WITH DIFFERENTIAL/PLATELET
Basophils Absolute: 0 10*3/uL (ref 0.0–0.1)
Basophils Relative: 0.8 % (ref 0.0–3.0)
Eosinophils Absolute: 0 10*3/uL (ref 0.0–0.7)
Eosinophils Relative: 1 % (ref 0.0–5.0)
HCT: 22.7 % — CL (ref 36.0–46.0)
Hemoglobin: 7.6 g/dL — CL (ref 12.0–15.0)
Lymphocytes Relative: 25.4 % (ref 12.0–46.0)
Lymphs Abs: 1.2 10*3/uL (ref 0.7–4.0)
MCHC: 33.6 g/dL (ref 30.0–36.0)
MCV: 93.4 fl (ref 78.0–100.0)
Monocytes Absolute: 0.3 10*3/uL (ref 0.1–1.0)
Monocytes Relative: 7.2 % (ref 3.0–12.0)
Neutro Abs: 3.2 10*3/uL (ref 1.4–7.7)
Neutrophils Relative %: 65.6 % (ref 43.0–77.0)
Platelets: 297 10*3/uL (ref 150.0–400.0)
RBC: 2.43 Mil/uL — ABNORMAL LOW (ref 3.87–5.11)
RDW: 15.2 % (ref 11.5–15.5)
WBC: 4.8 10*3/uL (ref 4.0–10.5)

## 2023-08-23 LAB — TSH: TSH: 0.42 u[IU]/mL (ref 0.35–5.50)

## 2023-08-23 NOTE — Telephone Encounter (Signed)
 Addressed via result note.  Pt instructed to go to ER

## 2023-08-23 NOTE — Telephone Encounter (Signed)
-----   Message from Neena Rhymes sent at 08/23/2023  1:12 PM EST ----- Your hemoglobin is considerably lower than it was when we checked it 8 months ago.  I'm not sure if this is due to heavy periods, GI blood loss (dark, tarry stools), or something else entirely.  But given your fatigue, elevated heart rate, and low BP- you need to go to the Emergency Room for complete evaluation and treatment

## 2023-08-23 NOTE — Telephone Encounter (Signed)
 Lab called with two critical results.  Hemoglobin 7.6 Hematocrit 22.7  Provider informed via teams and telephone note

## 2023-08-23 NOTE — Telephone Encounter (Signed)
 Pt has been notified and going to ER

## 2023-08-23 NOTE — Telephone Encounter (Signed)
 Patient has been notified  Patient reports she is going to call husband to take her to ER

## 2023-08-24 ENCOUNTER — Inpatient Hospital Stay
Admission: RE | Admit: 2023-08-24 | Discharge: 2023-08-24 | Disposition: A | Discharge status: Home / Self Care | Payer: Self-pay | Source: Ambulatory Visit | Attending: Hematology and Oncology | Admitting: Hematology and Oncology

## 2023-08-24 ENCOUNTER — Other Ambulatory Visit: Payer: Self-pay | Admitting: *Deleted

## 2023-08-24 DIAGNOSIS — Z171 Estrogen receptor negative status [ER-]: Secondary | ICD-10-CM

## 2023-08-24 LAB — IRON,TIBC AND FERRITIN PANEL

## 2023-08-24 LAB — EXTRA LAV TOP TUBE

## 2023-08-24 NOTE — Progress Notes (Signed)
 Received mychart message from pt stating she had a CT angio abdomen/ pelvis at [facility] which showed a left lower quadrant mass and is requesting MD visit and possible biopsy on this mass.  F/u with MD scheduled, RN reached out to our imaging team to obtain films for our epic system.

## 2023-08-27 ENCOUNTER — Telehealth: Payer: Self-pay

## 2023-08-27 NOTE — Telephone Encounter (Signed)
 Received call from Imaging stating we received image films from [place] and they will be transferred over via PACS. Attempted to call pt to let her know but call would not go through.

## 2023-08-28 ENCOUNTER — Other Ambulatory Visit: Payer: Self-pay | Admitting: *Deleted

## 2023-08-28 ENCOUNTER — Inpatient Hospital Stay

## 2023-08-28 ENCOUNTER — Encounter: Payer: Self-pay | Admitting: Hematology and Oncology

## 2023-08-28 ENCOUNTER — Inpatient Hospital Stay: Attending: Hematology and Oncology | Admitting: Hematology and Oncology

## 2023-08-28 VITALS — BP 125/83 | HR 92 | Temp 98.2°F | Resp 18 | Ht 60.5 in | Wt 149.8 lb

## 2023-08-28 DIAGNOSIS — E611 Iron deficiency: Secondary | ICD-10-CM

## 2023-08-28 DIAGNOSIS — Z9013 Acquired absence of bilateral breasts and nipples: Secondary | ICD-10-CM | POA: Insufficient documentation

## 2023-08-28 DIAGNOSIS — R1909 Other intra-abdominal and pelvic swelling, mass and lump: Secondary | ICD-10-CM | POA: Insufficient documentation

## 2023-08-28 DIAGNOSIS — D509 Iron deficiency anemia, unspecified: Secondary | ICD-10-CM | POA: Insufficient documentation

## 2023-08-28 DIAGNOSIS — C50411 Malignant neoplasm of upper-outer quadrant of right female breast: Secondary | ICD-10-CM | POA: Diagnosis not present

## 2023-08-28 DIAGNOSIS — Z923 Personal history of irradiation: Secondary | ICD-10-CM | POA: Diagnosis not present

## 2023-08-28 DIAGNOSIS — Z171 Estrogen receptor negative status [ER-]: Secondary | ICD-10-CM | POA: Diagnosis not present

## 2023-08-28 DIAGNOSIS — Z853 Personal history of malignant neoplasm of breast: Secondary | ICD-10-CM | POA: Insufficient documentation

## 2023-08-28 LAB — CBC WITH DIFFERENTIAL (CANCER CENTER ONLY)
Abs Immature Granulocytes: 0.01 10*3/uL (ref 0.00–0.07)
Basophils Absolute: 0 10*3/uL (ref 0.0–0.1)
Basophils Relative: 0 %
Eosinophils Absolute: 0 10*3/uL (ref 0.0–0.5)
Eosinophils Relative: 1 %
HCT: 29.1 % — ABNORMAL LOW (ref 36.0–46.0)
Hemoglobin: 9.6 g/dL — ABNORMAL LOW (ref 12.0–15.0)
Immature Granulocytes: 0 %
Lymphocytes Relative: 16 %
Lymphs Abs: 0.8 10*3/uL (ref 0.7–4.0)
MCH: 30.1 pg (ref 26.0–34.0)
MCHC: 33 g/dL (ref 30.0–36.0)
MCV: 91.2 fL (ref 80.0–100.0)
Monocytes Absolute: 0.2 10*3/uL (ref 0.1–1.0)
Monocytes Relative: 5 %
Neutro Abs: 3.9 10*3/uL (ref 1.7–7.7)
Neutrophils Relative %: 78 %
Platelet Count: 280 10*3/uL (ref 150–400)
RBC: 3.19 MIL/uL — ABNORMAL LOW (ref 3.87–5.11)
RDW: 14.6 % (ref 11.5–15.5)
WBC Count: 5 10*3/uL (ref 4.0–10.5)
nRBC: 0 % (ref 0.0–0.2)

## 2023-08-28 LAB — CMP (CANCER CENTER ONLY)
ALT: 12 U/L (ref 0–44)
AST: 12 U/L — ABNORMAL LOW (ref 15–41)
Albumin: 4.8 g/dL (ref 3.5–5.0)
Alkaline Phosphatase: 66 U/L (ref 38–126)
Anion gap: 7 (ref 5–15)
BUN: 21 mg/dL — ABNORMAL HIGH (ref 6–20)
CO2: 31 mmol/L (ref 22–32)
Calcium: 9.5 mg/dL (ref 8.9–10.3)
Chloride: 103 mmol/L (ref 98–111)
Creatinine: 0.8 mg/dL (ref 0.44–1.00)
GFR, Estimated: >60 mL/min (ref >60)
Glucose, Bld: 99 mg/dL (ref 70–99)
Potassium: 3.7 mmol/L (ref 3.5–5.1)
Sodium: 141 mmol/L (ref 135–145)
Total Bilirubin: 0.8 mg/dL (ref 0.0–1.2)
Total Protein: 7 g/dL (ref 6.5–8.1)

## 2023-08-28 LAB — SAMPLE TO BLOOD BANK

## 2023-08-28 LAB — FERRITIN: Ferritin: 5 ng/mL — ABNORMAL LOW (ref 11–307)

## 2023-08-28 LAB — VITAMIN B12: Vitamin B-12: 340 pg/mL (ref 180–914)

## 2023-08-28 LAB — IRON AND IRON BINDING CAPACITY (CC-WL,HP ONLY)
Iron: 23 ug/dL — ABNORMAL LOW (ref 28–170)
Saturation Ratios: 5 % — ABNORMAL LOW (ref 10.4–31.8)
TIBC: 459 ug/dL — ABNORMAL HIGH (ref 250–450)
UIBC: 436 ug/dL (ref 148–442)

## 2023-08-28 NOTE — Progress Notes (Signed)
 Patient Care Team: Sheliah Hatch, MD as PCP - General (Family Medicine) Emelia Loron, MD as Consulting Physician (General Surgery) Antony Blackbird, MD as Consulting Physician (Radiation Oncology) Janalyn Harder, MD (Inactive) as Consulting Physician (Dermatology) Pershing Proud, RN as Oncology Nurse Navigator Donnelly Angelica, RN as Oncology Nurse Navigator [place], Amy V, OD (Optometry) Serena Croissant, MD as Consulting Physician (Hematology and Oncology)  DIAGNOSIS:  Encounter Diagnoses  Name Primary?   Malignant neoplasm of upper-outer quadrant of right breast in female, estrogen receptor negative (HCC) Yes   Iron deficiency     SUMMARY OF ONCOLOGIC HISTORY: Oncology History  Malignant neoplasm of upper-outer quadrant of right breast in female, estrogen receptor negative (HCC)  05/03/2018 Initial Diagnosis   right breast upper outer quadrant and right axillary lymph node biopsy 05/03/2018 for a clinical T2 N2, stage IIIC invasive ductal carcinoma, triple negative, with an Ki-67 of 75%   06/03/2018 - 10/18/2018 Neo-Adjuvant Chemotherapy   Dose dense Adriamycin and Cytoxan followed by Taxol and carboplatin x12   11/14/2018 Surgery   Bilateral mastectomies: Left mastectomy: Fibrocystic changes, right mastectomy: Complete pathologic response no evidence of residual cancer, 0/13 lymph nodes negative   12/31/2018 - 02/10/2019 Radiation Therapy   Adjuvant XRT     CHIEF COMPLIANT: Follow-up of recent emergency room visit for anemia, mesenteric mass  HISTORY OF PRESENT ILLNESS:   History of Present Illness The patient, with a history of Triple Negative Breast Cancer, presents with concerns about a recently discovered mesenteric mass and associated anemia. The patient reports having been unwell with a cold for about a week, during which she took Advil Cold and Sinus, a medication she does not usually take. Following this, she woke up one day feeling extremely unwell, with  abdominal pain and a general feeling of malaise. She also experienced vomiting and dark stools. The patient's condition improved slightly over the weekend but worsened again on Monday. She visited her family doctor who diagnosed her with a sinus infection. However, the patient felt that something else was wrong and requested blood work. The results revealed anemia, prompting a visit to the emergency department where a CT scan was performed. The scan revealed a mesenteric mass in the left lower quadrant. The patient reports no abdominal pain currently.     ALLERGIES:  is allergic to amoxicillin-pot clavulanate, hydrocodone, and keflex [cephalexin].  MEDICATIONS:  Current Outpatient Medications  Medication Sig Dispense Refill   B Complex-C (B-COMPLEX WITH VITAMIN C) tablet Take 1 tablet by mouth daily as needed.     Calcium Acetate-Magnesium Carb 450-200 MG TABS Take by mouth.     Cholecalciferol (VITAMIN D) 125 MCG (5000 UT) CAPS Take 5,000 Units by mouth daily. 30 capsule    fluticasone (FLONASE) 50 MCG/ACT nasal spray USE 2 SPRAYS IN EACH NOSTRIL DAILY 48 g 3   loratadine (CLARITIN) 10 MG tablet Take 10 mg by mouth daily as needed for allergies.     Multiple Vitamins-Minerals (MULTIVITAMIN WITH MINERALS) tablet Take 1 tablet by mouth daily.     Probiotic Product (PROBIOTIC DAILY PO) Take 1 tablet by mouth.     cyclobenzaprine (FLEXERIL) 10 MG tablet Take 1 tablet (10 mg total) by mouth as needed for muscle spasms. 1/2 to I tab TID (Patient not taking: Reported on 08/28/2023) 30 tablet 2   hydrocortisone 2.5 % cream Apply topically as needed. (Patient not taking: Reported on 08/28/2023) 30 g 2   Turmeric 500 MG CAPS Take 1 capsule by mouth daily. (  Patient not taking: Reported on 08/28/2023)     No current facility-administered medications for this visit.    PHYSICAL EXAMINATION: ECOG PERFORMANCE STATUS: 1 - Symptomatic but completely ambulatory  Vitals:   08/28/23 1204  BP: 125/83  Pulse:  92  Resp: 18  Temp: 98.2 F (36.8 C)  SpO2: 100%   Filed Weights   08/28/23 1204  Weight: 149 lb 12.8 oz (67.9 kg)    Physical Exam VITALS: BP- 106/72  (exam performed in the presence of a chaperone)  LABORATORY DATA:  I have reviewed the data as listed    Latest Ref Rng & Units 08/28/2023   11:23 AM 08/22/2023    2:50 PM 11/27/2022   11:29 AM  CMP  Glucose 70 - 99 mg/dL 99  161  89   BUN 6 - 20 mg/dL 21  10  17    Creatinine 0.44 - 1.00 mg/dL 0.96  0.45  4.09   Sodium 135 - 145 mmol/L 141  141  138   Potassium 3.5 - 5.1 mmol/L 3.7  4.6  5.0   Chloride 98 - 111 mmol/L 103  104  98   CO2 22 - 32 mmol/L 31  30  32   Calcium 8.9 - 10.3 mg/dL 9.5  9.6  81.1   Total Protein 6.5 - 8.1 g/dL 7.0   7.4   Total Bilirubin 0.0 - 1.2 mg/dL 0.8   1.2   Alkaline Phos 38 - 126 U/L 66   64   AST 15 - 41 U/L 12   13   ALT 0 - 44 U/L 12   11     Lab Results  Component Value Date   WBC 5.0 08/28/2023   HGB 9.6 (L) 08/28/2023   HCT 29.1 (L) 08/28/2023   MCV 91.2 08/28/2023   PLT 280 08/28/2023   NEUTROABS 3.9 08/28/2023    ASSESSMENT & PLAN:  Malignant neoplasm of upper-outer quadrant of right breast in female, estrogen receptor negative (HCC) 05/03/2018: right breast upper outer quadrant and right axillary lymph node biopsy 05/03/2018 for a clinical T2 N2, stage IIIC invasive ductal carcinoma, triple negative, with an Ki-67 75%, no deleterious genetic mutations   06/03/2018-10/18/2018: Neoadjuvant chemotherapy with dose dense Adriamycin and Cytoxan followed by Taxol and carboplatin   11/14/2018: Bilateral mastectomies: Left mastectomy: Fibrocystic changes, right mastectomy: Complete pathologic response no evidence of residual cancer, 0/13 lymph nodes negative Completed adjuvant radiation therapy ---------------------------------------------------------------------------------------------------------------------------------------------------------------  Treatment plan:  Surveillance November 2020: CT CAP, CTneck, CT head: No evidence of metastatic disease.  Soft tissue edema in the right axilla 05/14/2019: Biopsy right axilla: Fibrosis no malignancy. Bone scan 05/20/2019: No evidence of bone metastases.   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 Breast/chest wall examination: 06/04/2023: Benign, no palpable lumps or nodules.  She has minor keloid on the left chest wall. 2. no role of imaging studies because she had bilateral mastectomies. 3.  Signatera: Negative  ------------------------------------------------------------------------------------------------------------------------------------- [facility] emergency room: Abdominal pain and severe anemia CTA ABDOMEN PELVIS 1. No acute GI bleed identified. 2. Complex nonspecific 3.5 x 3.5 x 3 cm solid well-circumscribed enhancing left lower quadrant mesenteric mass density with prominent arterial and venous vascularity, with some enlarged vessels extending into lumen of adjacent small bowel, best seen on axial and sagittal arterial series. No active GI bleeding identified.  This may be the source of the patient's blood loss. Underlying neoplasm cannot be excluded. Consider biopsy of left upper quadrant mass from posterior approach. 3. Smaller 8 mm mesenteric nodule along left upper anterior paracentral pelvis, without associated hemorrhage.   Labs: 08/22/2023: Hemoglobin 7.7, ferritin 13, iron saturation 14%, MCV 95, B12 470, reticulocyte 6.95%, haptoglobin: Normal, INR 1  Recommendation: PET CT scan and biopsy if it was found to be hypermetabolic IV iron infusion Gastroenterology referral for black-colored stools and severe anemia: Sent a referral to Dr. Leonides Schanz   Assessment & Plan Mesenteric mass Complex 3x5x3 cm solid mass with  prominent vascularity in left lower quadrant mesentery. Differential includes benign and malignant etiologies. PET scan recommended to assess metabolic activity. - Order PET scan to evaluate metabolic activity. - Plan biopsy if PET scan shows increased uptake. - Consider percutaneous biopsy based on PET scan results.  Triple-negative breast cancer (TNBC) TNBC with concern for metastasis to mesentery. Mesenteric mass is atypical for metastasis, requiring thorough evaluation. - Monitor for metastasis through PET scan and biopsy if indicated.  Anemia Hemoglobin improved post-transfusion. Etiology unclear; possible gastrointestinal bleeding and iron deficiency suspected. Dietary habits may contribute. - Check iron levels for deficiency. - Refer to Dr. Leonides Schanz for gastrointestinal evaluation. - Administer iron infusion ([facility]er) at [facility], three doses weekly. - Start B12 supplementation, 5000 mcg sublingual.  Follow-up Ensure timely evaluation and management of mesenteric mass and anemia. - Schedule follow-up three days post-PET scan to discuss results. - Coordinate with Adelina Mings for PET scan and follow-up appointments.      Orders Placed This Encounter  Procedures   NM PET Image Initial (PI) Skull Base To Thigh    Standing Status:   Future    Expected Date:   08/31/2023    Expiration Date:   08/27/2024    If indicated for the ordered procedure, I authorize the administration of a radiopharmaceutical per Radiology protocol:   Yes    Is the patient pregnant?:   No    Preferred imaging location?:   [facility]    Release to patient:   Immediate   Ferritin    Standing Status:   Future    Number of Occurrences:   1    Expiration Date:   08/27/2024   Iron and Iron Binding Capacity ([facility] only)    Standing Status:   Future    Number of Occurrences:   1    Expiration Date:   08/27/2024   Vitamin B12    Standing Status:   Future    Number of Occurrences:   1    Expiration  Date:   08/27/2024   Ambulatory referral to Gastroenterology    Referral Priority:   Routine    Referral Type:   Consultation    Referral Reason:   Specialty Services Required    Referred to Provider:   Imogene Burn, MD    Number of Visits Requested:   1   The patient has a good understanding of the overall plan. she agrees with it. she will call with any problems that may develop before the next visit here. Total time spent: 30 mins including face to face time and time spent for planning, charting and co-ordination of care   Tamsen Meek, MD 08/28/23

## 2023-08-28 NOTE — Assessment & Plan Note (Signed)
 05/03/2018: right breast upper outer quadrant and right axillary lymph node biopsy 05/03/2018 for a clinical T2 N2, stage IIIC invasive ductal carcinoma, triple negative, with an Ki-67 75%, no deleterious genetic mutations   06/03/2018-10/18/2018: Neoadjuvant chemotherapy with dose dense Adriamycin and Cytoxan followed by Taxol and carboplatin   11/14/2018: Bilateral mastectomies: Left mastectomy: Fibrocystic changes, right mastectomy: Complete pathologic response no evidence of residual cancer, 0/13 lymph nodes negative Completed adjuvant radiation therapy ---------------------------------------------------------------------------------------------------------------------------------------------------------------  Treatment plan: Surveillance November 2020: CT CAP, CTneck, CT head: No evidence of metastatic disease.  Soft tissue edema in the right axilla 05/14/2019: Biopsy right axilla: Fibrosis no malignancy. Bone scan 05/20/2019: No evidence of bone metastases.   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 Breast/chest wall examination: 06/04/2023: Benign, no palpable lumps or nodules.  She has minor keloid on the left chest wall. 2. no role of imaging studies because she had bilateral mastectomies. 3.  Signatera: Negative  ------------------------------------------------------------------------------------------------------------------------------------- [facility] emergency room: Abdominal pain and severe anemia CTA ABDOMEN PELVIS 1. No acute GI bleed identified. 2. Complex nonspecific 3.5 x 3.5 x 3 cm solid well-circumscribed enhancing left lower quadrant mesenteric mass density with prominent arterial and venous vascularity, with some enlarged vessels extending into lumen of adjacent small bowel, best seen on axial  and sagittal arterial series. No active GI bleeding identified. This may be the source of the patient's blood loss. Underlying neoplasm cannot be excluded. Consider biopsy of left upper quadrant mass from posterior approach. 3. Smaller 8 mm mesenteric nodule along left upper anterior paracentral pelvis, without associated hemorrhage.   Labs: 08/22/2023: Hemoglobin 7.7, ferritin 13, iron saturation 14%, MCV 95, B12 470, reticulocyte 6.95%, haptoglobin: Normal, INR 1  Recommendation: CT-guided biopsy of the mesenteric mass IV iron infusion Gastroenterology referral for black-colored stools and severe anemia

## 2023-08-31 ENCOUNTER — Encounter (HOSPITAL_COMMUNITY)
Admission: RE | Admit: 2023-08-31 | Discharge: 2023-08-31 | Disposition: A | Discharge status: Home / Self Care | Source: Ambulatory Visit | Attending: Hematology and Oncology | Admitting: Hematology and Oncology

## 2023-08-31 DIAGNOSIS — Z171 Estrogen receptor negative status [ER-]: Secondary | ICD-10-CM | POA: Insufficient documentation

## 2023-08-31 DIAGNOSIS — C481 Malignant neoplasm of specified parts of peritoneum: Secondary | ICD-10-CM | POA: Diagnosis not present

## 2023-08-31 DIAGNOSIS — C50411 Malignant neoplasm of upper-outer quadrant of right female breast: Secondary | ICD-10-CM | POA: Diagnosis not present

## 2023-08-31 LAB — GLUCOSE, CAPILLARY: Glucose-Capillary: 94 mg/dL (ref 70–99)

## 2023-08-31 MED ORDER — FLUDEOXYGLUCOSE F - 18 (FDG) INJECTION
7.3700 | Freq: Once | INTRAVENOUS | Status: AC
  Administered 2023-08-31: 7.37 via INTRAVENOUS

## 2023-09-04 ENCOUNTER — Encounter: Payer: Self-pay | Admitting: Hematology and Oncology

## 2023-09-04 ENCOUNTER — Telehealth: Payer: Self-pay

## 2023-09-04 NOTE — Telephone Encounter (Signed)
 Called patient to schedule appointment to be seen in office no answer left message on voice mail to call office back

## 2023-09-06 ENCOUNTER — Encounter: Payer: Self-pay | Admitting: Hematology and Oncology

## 2023-09-06 ENCOUNTER — Encounter: Payer: Self-pay | Admitting: Internal Medicine

## 2023-09-06 ENCOUNTER — Inpatient Hospital Stay (HOSPITAL_BASED_OUTPATIENT_CLINIC_OR_DEPARTMENT_OTHER): Admitting: Hematology and Oncology

## 2023-09-06 ENCOUNTER — Ambulatory Visit: Admitting: Internal Medicine

## 2023-09-06 VITALS — BP 130/80 | HR 83 | Ht 64.0 in | Wt 149.0 lb

## 2023-09-06 DIAGNOSIS — Z171 Estrogen receptor negative status [ER-]: Secondary | ICD-10-CM | POA: Diagnosis not present

## 2023-09-06 DIAGNOSIS — K921 Melena: Secondary | ICD-10-CM | POA: Diagnosis not present

## 2023-09-06 DIAGNOSIS — D509 Iron deficiency anemia, unspecified: Secondary | ICD-10-CM

## 2023-09-06 DIAGNOSIS — C50411 Malignant neoplasm of upper-outer quadrant of right female breast: Secondary | ICD-10-CM | POA: Diagnosis not present

## 2023-09-06 DIAGNOSIS — Z1211 Encounter for screening for malignant neoplasm of colon: Secondary | ICD-10-CM

## 2023-09-06 MED ORDER — NA SULFATE-K SULFATE-MG SULF 17.5-3.13-1.6 GM/177ML PO SOLN: ORAL | 0 refills | Status: DC

## 2023-09-06 NOTE — Progress Notes (Signed)
 HEMATOLOGY-ONCOLOGY TELEPHONE VISIT PROGRESS NOTE  I connected with our patient on 09/06/23 at  9:45 AM EDT by telephone and verified that I am speaking with the correct person using two identifiers.  I discussed the limitations, risks, security and privacy concerns of performing an evaluation and management service by telephone and the availability of in person appointments.  I also discussed with the patient that there may be a patient responsible charge related to this service. The patient expressed understanding and agreed to proceed.   History of Present Illness: Telephone follow-up to discuss results of PET scan  History of Present Illness Darlene Grant is a 48 year old female with anemia who presents with fatigue and difficulty breathing. She has arranged for her husband to be home with the children during the treatment.  She has profound iron deficiency anemia confirmed by laboratory results. She is scheduled to start intravenous iron therapy with Venifer due to the severity of her condition. She experiences significant fatigue and difficulty breathing, although her oxygen saturation remains at 100%. A recent PET CT scan did not show any signs of cancer.    Oncology History  Malignant neoplasm of upper-outer quadrant of right breast in female, estrogen receptor negative (HCC)  05/03/2018 Initial Diagnosis   right breast upper outer quadrant and right axillary lymph node biopsy 05/03/2018 for a clinical T2 N2, stage IIIC invasive ductal carcinoma, triple negative, with an Ki-67 of 75%   06/03/2018 - 10/18/2018 Neo-Adjuvant Chemotherapy   Dose dense Adriamycin and Cytoxan followed by Taxol and carboplatin x12   11/14/2018 Surgery   Bilateral mastectomies: Left mastectomy: Fibrocystic changes, right mastectomy: Complete pathologic response no evidence of residual cancer, 0/13 lymph nodes negative   12/31/2018 - 02/10/2019 Radiation Therapy   Adjuvant XRT     REVIEW OF  SYSTEMS:   Constitutional: Denies fevers, chills or abnormal weight loss All other systems were reviewed with the patient and are negative. Observations/Objective:     Assessment Plan:  Malignant neoplasm of upper-outer quadrant of right breast in female, estrogen receptor negative (HCC) 05/03/2018: right breast upper outer quadrant and right axillary lymph node biopsy 05/03/2018 for a clinical T2 N2, stage IIIC invasive ductal carcinoma, triple negative, with an Ki-67 75%, no deleterious genetic mutations   06/03/2018-10/18/2018: Neoadjuvant chemotherapy with dose dense Adriamycin and Cytoxan followed by Taxol and carboplatin   11/14/2018: Bilateral mastectomies: Left mastectomy: Fibrocystic changes, right mastectomy: Complete pathologic response no evidence of residual cancer, 0/13 lymph nodes negative Completed adjuvant radiation therapy ---------------------------------------------------------------------------------------------------------------------------------------------------------------  Treatment plan: Surveillance November 2020: CT CAP, CTneck, CT head: No evidence of metastatic disease.  Soft tissue edema in the right axilla 05/14/2019: Biopsy right axilla: Fibrosis no malignancy. Bone scan 05/20/2019: No evidence of bone metastases.   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 Breast/chest wall examination: 06/04/2023: Benign, no palpable lumps or nodules.  She has minor keloid on the left chest wall. 2. no role of imaging studies because she had bilateral mastectomies. 3.  Signatera: Negative  ------------------------------------------------------------------------------------------------------------------------------------- [facility] emergency room: severe anemia CTA ABDOMEN PELVIS 1. No acute GI  bleed identified. 2. Complex nonspecific 3.5 x 3.5 x 3 cm solid well-circumscribed enhancing left lower quadrant mesenteric mass    PET CT scan 09/01/2023: Mesenteric mass does not demonstrate any hypermetabolic activity.  Could be unusual extrarenal meta  nephric tissue.  No other evidence of metastatic disease.  Discussion: I discussed with the patient that there are 2 options.  First option would be to continue to watch and monitor this a second option would be to request a surgical exploration and resection. I requested Dr. Dwain Sarna to review the scans and give his opinion.  She is seeing Dr. Leonides Schanz with gastroenterology and we are also interested to hear her opinion on the PET scan findings.  Iron deficiency anemia: Patient is set up for IV iron infusions and she is seeing Dr. Leonides Schanz today for a GI consultation. --------------------------------- Assessment and Plan Assessment & Plan Iron deficiency anemia Profound iron deficiency anemia causing fatigue and dyspnea. Intravenous iron therapy indicated. Insurance approved standard Venifer infusion. - Administer intravenous iron infusion (Venifer) starting tomorrow.  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 Awaiting Dr. Doreen Salvage input for management. No additional imaging needed. - Consult with Dr. Dwain Sarna for further evaluation and management. - Consider watchful monitoring or surgical intervention based on Dr. Doreen Salvage recommendation.      I discussed the assessment and treatment plan with the patient. The patient was provided an opportunity to ask questions and all were answered. The patient agreed with the plan and demonstrated an understanding of the instructions. The patient was advised to call back or seek an in-person evaluation if the symptoms worsen or if the condition fails to improve as anticipated.   I provided 20 minutes of non-face-to-face time during this encounter.  This  includes time for charting and coordination of care   Tamsen Meek, MD

## 2023-09-06 NOTE — Progress Notes (Signed)
 Chief Complaint: IDA  HPI : 48 year old female with history of anemia, breast cancer, hemorrhoids presents with IDA  She has had IDA that was discovered by Dr. Pamelia Hoit. She had a really bad cold in 07/2023. She did take cold/sinus medication and thought that she may have been dehydrated. She was feeling lightheaded. She had an episode of black tarry stools that day. These dark tarry stools resolved after a few days. She went to her family doctor and was found to have tachycardic and to have a low BP. She was found to have a Hb of 7.6 a little while after that. She went to the ED and got 1 U pRBC. Her breathing got better after she had a units of PRBC. FOBT was negative. Denies prior EGD or colonoscopy. She had a negative Cologuard this year. Denies use of blood thinners. Will use ibuprofen sparingly. Dad had colon polyps and microscopic colitis. Denies N&V, dysphagia, ab pain, diarrhea, constipation, acid reflux, and hematochezia. She starts IV iron tomorrow because she was not able to tolerate PO iron.    Past Medical History:  Diagnosis Date   Abnormal Pap smear    ASCUS 03/13/11   Anemia    Breast cancer (HCC)    Complication of anesthesia    pt states that all narcotics make her angry and she would prefer to avoid them.   Grade I hemorrhoids 02/23/2017   Piriformis syndrome of left side 01/15/2018   Vaginal Pap smear, abnormal      Past Surgical History:  Procedure Laterality Date   BREAST BIOPSY Right 8/16   CESAREAN SECTION N/A 11/21/2013   Procedure: CESAREAN SECTION;  Surgeon: Esmeralda Arthur, MD;  Location: [facility];  Service: Obstetrics;  Laterality: N/A;   CESAREAN SECTION N/A 06/23/2016   Procedure: CESAREAN SECTION;  Surgeon: Allie Bossier, MD;  Location: [facility];  Service: Obstetrics;  Laterality: N/A;   HAND SURGERY  04/2012   ligament repair   MASTECTOMY WITH AXILLARY LYMPH NODE DISSECTION Bilateral 11/14/2018   Procedure: RIGHT MASTECTOMY WITH RIGHT AXILLARY LYMPH NODE  DISSECTION AND LEFT RISK REDUCING MASTECTOMY;  Surgeon: Emelia Loron, MD;  Location: [facility];  Service: General;  Laterality: Bilateral;  LATE ENTRY: Corrected laterality documentation to read right axillary lymph node dissection vs. documented laterality of left.     PILONIDAL CYST EXCISION  2000   PORTACATH PLACEMENT N/A 05/28/2018   Procedure: INSERTION PORT-A-CATH WITH ULTRASOUND;  Surgeon: Emelia Loron, MD;  Location: [facility];  Service: General;  Laterality: N/A;   WISDOM TOOTH EXTRACTION  AGE 47   Family History  Problem Relation Age of Onset   Breast cancer Mother 15   Hypertension Father    Colitis Father    Stomach cancer Maternal Grandmother    Skin cancer Maternal Grandmother    Depression Maternal Grandmother    Lung cancer Maternal Grandfather        lung   Liver disease Neg Hx    Esophageal cancer Neg Hx    Colon cancer Neg Hx    Social History   Tobacco Use   Smoking status: Never   Smokeless tobacco: Never  Vaping Use   Vaping status: Never Used  Substance Use Topics   Alcohol use: Yes    Alcohol/week: 0.0 standard drinks of alcohol    Comment: occassionally   Drug use: No   Current Outpatient Medications  Medication Sig Dispense Refill   Calcium Acetate-Magnesium Carb  450-200 MG TABS Take by mouth.     Cholecalciferol (VITAMIN D) 125 MCG (5000 UT) CAPS Take 5,000 Units by mouth daily. 30 capsule    cyclobenzaprine (FLEXERIL) 10 MG tablet Take 1 tablet (10 mg total) by mouth as needed for muscle spasms. 1/2 to I tab TID (Patient not taking: Reported on 08/28/2023) 30 tablet 2   fluticasone (FLONASE) 50 MCG/ACT nasal spray USE 2 SPRAYS IN EACH NOSTRIL DAILY 48 g 3   hydrocortisone 2.5 % cream Apply topically as needed. (Patient not taking: Reported on 08/28/2023) 30 g 2   loratadine (CLARITIN) 10 MG tablet Take 10 mg by mouth daily as needed for allergies.     Multiple Vitamins-Minerals (MULTIVITAMIN WITH MINERALS)  tablet Take 1 tablet by mouth daily.     Probiotic Product (PROBIOTIC DAILY PO) Take 1 tablet by mouth. (Patient not taking: Reported on 09/06/2023)     Turmeric 500 MG CAPS Take 1 capsule by mouth daily. (Patient not taking: Reported on 08/28/2023)     No current facility-administered medications for this visit.   Allergies  Allergen Reactions   Amoxicillin-Pot Clavulanate Diarrhea    diarrhea diarrhea diarrhea diarrhea diarrhea   Hydrocodone Itching   Keflex [Cephalexin] Diarrhea     Review of Systems: All systems reviewed and negative except where noted in HPI.   Physical Exam: BP 130/80   Pulse 83   Ht 5\' 4"  (1.626 m)   Wt 149 lb (67.6 kg)   BMI 25.58 kg/m  Constitutional: Pleasant,well-developed, female in no acute distress. HEENT: Normocephalic and atraumatic. Conjunctivae are normal. No scleral icterus. Cardiovascular: Normal rate, regular rhythm.  Pulmonary/chest: Effort normal and breath sounds normal. No wheezing, rales or rhonchi. Abdominal: Soft, nondistended, nontender. Bowel sounds active throughout. There are no masses palpable. No hepatomegaly. Extremities: No edema Neurological: Alert and oriented to person place and time. Skin: Skin is warm and dry. No rashes noted. Psychiatric: Normal mood and affect. Behavior is normal.  Labs 08/2023: CBC with low Hb of 9.6. CMP unremarkable. Ferritin low at 5.  CTA A/P w/contrast 08/23/23: IMPRESSION:  CTA ABDOMEN PELVIS  1.  No acute GI bleed identified.  2.  Complex nonspecific 3.5 x 3.5 x 3 cm solid well-circumscribed enhancing left lower quadrant mesenteric mass density with prominent arterial and venous vascularity, with some enlarged vessels extending into lumen of adjacent small bowel, best seen on axial and sagittal arterial series.  No active GI bleeding identified.  This may be the source of the patient's blood loss.  Underlying neoplasm cannot be excluded.  Consider biopsy of left upper quadrant mass from  posterior approach.  3.  Smaller 8 mm mesenteric nodule along left upper anterior paracentral pelvis, without associated hemorrhage.     PET CT 08/31/23: IMPRESSION: 1. The patient's mesenteric mass does not demonstrate any hypermetabolism. In retrospect I think this lesion was present, but easily missed on the prior CT scans as it looks like part of the small bowel. I think it is unlikely this is a GIST tumor or metastatic disease. It could represent some type unusual extra renal metanephric tissue. Based on the prior outside CT scan it appears to have its own arterial blood supply and venous drainage and its attenuation is very close to that of the renal parenchyma. 2. No findings for metastatic disease involving the neck, chest, abdomen/pelvis or bony structures. 3. Surgical changes from bilateral mastectomies and right axillary lymph node dissection. No findings for recurrent tumor in the chest wall.  ASSESSMENT AND PLAN: IDA Melena Colon cancer screening Patient presents for further evaluation of iron deficiency anemia, which may have been exacerbated by use of cold/sinus medications that may have contained NSAIDs.  Patient had symptomatic anemia with a hemoglobin as low as 7.6. Her most recent hemoglobin was 9.6, suggesting that her blood counts may be improving over time.  She has never had an EGD or colonoscopy in the past.  I went over the risks and benefits of the EGD and colonoscopy procedures, and she is agreeable to proceeding. - Patient will start IV iron infusions soon - EGD/colonoscopy [facility]  Eulah Pont, MD  I spent 47 minutes of time, including in depth chart review, independent review of results as outlined above, communicating results with the patient directly, face-to-face time with the patient, coordinating care, ordering studies and medications as appropriate, and documentation.

## 2023-09-06 NOTE — Patient Instructions (Addendum)
 You have been scheduled for an endoscopy and colonoscopy. Please follow the written instructions given to you at your visit today.  If you use inhalers (even only as needed), please bring them with you on the day of your procedure.  DO NOT TAKE 7 DAYS PRIOR TO TEST- Trulicity (dulaglutide) Ozempic, Wegovy (semaglutide) Mounjaro (tirzepatide) Bydureon Bcise (exanatide extended release)  DO NOT TAKE 1 DAY PRIOR TO YOUR TEST Rybelsus (semaglutide) Adlyxin (lixisenatide) Victoza (liraglutide) Byetta (exanatide) ___________________________________________________________________________  We have sent the following medications to your pharmacy for you to pick up at your convenience: Suprep   _______________________________________________________  If your blood pressure at your visit was 140/90 or greater, please contact your primary care physician to follow up on this.  _______________________________________________________  If you are age 49 or older, your body mass index should be between 23-30. Your Body mass index is 25.58 kg/m. If this is out of the aforementioned range listed, please consider follow up with your Primary Care Provider.  If you are age 63 or younger, your body mass index should be between 19-25. Your Body mass index is 25.58 kg/m. If this is out of the aformentioned range listed, please consider follow up with your Primary Care Provider.   ________________________________________________________  The [facility] providers would like to encourage you to use [facility] to communicate with providers for non-urgent requests or questions.  Due to long hold times on the telephone, sending your provider a message by [facility] may be a faster and more efficient way to get a response.  Please allow 48 business hours for a response.  Please remember that this is for non-urgent requests.  _______________________________________________________  Due to recent changes in healthcare  laws, you may see the results of your imaging and laboratory studies on MyChart before your provider has had a chance to review them.  We understand that in some cases there may be results that are confusing or concerning to you. Not all laboratory results come back in the same time frame and the provider may be waiting for multiple results in order to interpret others.  Please give [place] 48 hours in order for your provider to thoroughly review all the results before contacting the office for clarification of your results.   Thank you for entrusting me with your care and for choosing [facility],  Dr. Eulah Pont

## 2023-09-06 NOTE — Assessment & Plan Note (Addendum)
 05/03/2018: right breast upper outer quadrant and right axillary lymph node biopsy 05/03/2018 for a clinical T2 N2, stage IIIC invasive ductal carcinoma, triple negative, with an Ki-67 75%, no deleterious genetic mutations   06/03/2018-10/18/2018: Neoadjuvant chemotherapy with dose dense Adriamycin and Cytoxan followed by Taxol and carboplatin   11/14/2018: Bilateral mastectomies: Left mastectomy: Fibrocystic changes, right mastectomy: Complete pathologic response no evidence of residual cancer, 0/13 lymph nodes negative Completed adjuvant radiation therapy ---------------------------------------------------------------------------------------------------------------------------------------------------------------  Treatment plan: Surveillance November 2020: CT CAP, CTneck, CT head: No evidence of metastatic disease.  Soft tissue edema in the right axilla 05/14/2019: Biopsy right axilla: Fibrosis no malignancy. Bone scan 05/20/2019: No evidence of bone metastases.   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 Breast/chest wall examination: 06/04/2023: Benign, no palpable lumps or nodules.  She has minor keloid on the left chest wall. 2. no role of imaging studies because she had bilateral mastectomies. 3.  Signatera: Negative  ------------------------------------------------------------------------------------------------------------------------------------- [facility] emergency room: Abdominal pain and severe anemia CTA ABDOMEN PELVIS 1. No acute GI bleed identified. 2. Complex nonspecific 3.5 x 3.5 x 3 cm solid well-circumscribed enhancing left lower quadrant mesenteric mass    PET CT scan 09/01/2023: Mesenteric mass does not demonstrate any hypermetabolic activity.  Could be unusual extrarenal meta nephric tissue.  No  other evidence of metastatic disease.  Discussion: I discussed with the patient that there are 2 options.  First option would be to continue to watch and monitor this a second option would be to request a surgical exploration and resection. I requested Dr. Dwain Sarna to review the scans and give his opinion.

## 2023-09-07 ENCOUNTER — Ambulatory Visit

## 2023-09-07 ENCOUNTER — Other Ambulatory Visit: Payer: Self-pay | Admitting: Hematology and Oncology

## 2023-09-07 VITALS — BP 107/75 | HR 81 | Temp 98.0°F | Resp 18 | Ht 64.0 in | Wt 150.8 lb

## 2023-09-07 DIAGNOSIS — D500 Iron deficiency anemia secondary to blood loss (chronic): Secondary | ICD-10-CM | POA: Diagnosis not present

## 2023-09-07 DIAGNOSIS — D509 Iron deficiency anemia, unspecified: Secondary | ICD-10-CM | POA: Insufficient documentation

## 2023-09-07 DIAGNOSIS — K921 Melena: Secondary | ICD-10-CM | POA: Diagnosis not present

## 2023-09-07 DIAGNOSIS — E611 Iron deficiency: Secondary | ICD-10-CM

## 2023-09-07 MED ORDER — SODIUM CHLORIDE 0.9 % IV SOLN
300.0000 mg | INTRAVENOUS | Status: DC
  Administered 2023-09-07: 300 mg via INTRAVENOUS
  Filled 2023-09-07: qty 15

## 2023-09-07 NOTE — Progress Notes (Unsigned)
 Diagnosis: Iron Deficiency Anemia  Provider:  Chilton Greathouse MD  Procedure: IV Infusion  IV Type: Peripheral, IV Location: L Antecubital  Venofer (Iron Sucrose), Dose: 300 mg  {Infusion Start Time:25399}  {Infusion Stop Time:25400}  Post Infusion IV Care: {[facility] Post Infusion:25398}  Discharge: {Condition:19696:::1}, {Destination:18313::"Home":1} . {CHINFAVS:28985}  Performed by:  Adriana Mccallum, RN

## 2023-09-07 NOTE — Patient Instructions (Signed)

## 2023-09-07 NOTE — Progress Notes (Signed)
 Diagnosis: Iron Deficiency Anemia  Provider:  Chilton Greathouse MD  Procedure: IV Infusion  IV Type: Peripheral, IV Location: L Antecubital  Venofer (Iron Sucrose), Dose: 300 mg  Infusion Start Time: 1208  Infusion Stop Time: 1352  Post Infusion IV Care: Observation period completed and Peripheral IV Discontinued  Discharge: Condition: Good, Destination: Home . AVS Declined  Performed by:  Adriana Mccallum, RN

## 2023-09-10 ENCOUNTER — Telehealth: Payer: Self-pay

## 2023-09-10 NOTE — Telephone Encounter (Signed)
 Auth Submission: APPROVED Site of care: Site of care: [facility] Payer: BCBS commercial Medication & CPT/J Code(s) submitted: Venofer (Iron Sucrose) J1756 Route of submission (phone, fax, portal): phone Phone # Fax # Auth type: Buy/Bill PB Units/visits requested: 300 x 3 doses Reference number: [id number] Approval from: 09/06/23 to 03/04/24

## 2023-09-11 ENCOUNTER — Other Ambulatory Visit: Payer: Self-pay | Admitting: Hematology and Oncology

## 2023-09-11 DIAGNOSIS — R1900 Intra-abdominal and pelvic swelling, mass and lump, unspecified site: Secondary | ICD-10-CM

## 2023-09-11 NOTE — Progress Notes (Signed)
 Dr.Mccollough suggested liver/spleen sulphur colloid scan to see if the mass is a accessory spleen

## 2023-09-14 ENCOUNTER — Ambulatory Visit (INDEPENDENT_AMBULATORY_CARE_PROVIDER_SITE_OTHER)

## 2023-09-14 VITALS — BP 106/70 | HR 89 | Temp 98.3°F | Resp 18 | Ht 64.0 in | Wt 153.8 lb

## 2023-09-14 DIAGNOSIS — K921 Melena: Secondary | ICD-10-CM | POA: Diagnosis not present

## 2023-09-14 DIAGNOSIS — D500 Iron deficiency anemia secondary to blood loss (chronic): Secondary | ICD-10-CM | POA: Diagnosis not present

## 2023-09-14 DIAGNOSIS — D509 Iron deficiency anemia, unspecified: Secondary | ICD-10-CM

## 2023-09-14 DIAGNOSIS — E611 Iron deficiency: Secondary | ICD-10-CM

## 2023-09-14 MED ORDER — SODIUM CHLORIDE 0.9 % IV SOLN
300.0000 mg | INTRAVENOUS | Status: DC
  Administered 2023-09-14: 300 mg via INTRAVENOUS
  Filled 2023-09-14: qty 15

## 2023-09-14 NOTE — Progress Notes (Signed)
 Diagnosis: Iron Deficiency Anemia  Provider:  Chilton Greathouse MD  Procedure: IV Infusion  IV Type: Peripheral, IV Location: L Antecubital  Venofer (Iron Sucrose), Dose: 300 mg  Infusion Start Time: 1339  Infusion Stop Time: 1520  Post Infusion IV Care: Pt declined observation.PIV discontinued.  Discharge: Condition: Good, Destination: Home . AVS Declined  Performed by:  Garnette Czech, RN

## 2023-09-21 ENCOUNTER — Ambulatory Visit

## 2023-09-21 VITALS — BP 107/71 | HR 63 | Temp 98.5°F | Resp 16 | Ht 64.0 in | Wt 149.8 lb

## 2023-09-21 DIAGNOSIS — D500 Iron deficiency anemia secondary to blood loss (chronic): Secondary | ICD-10-CM

## 2023-09-21 DIAGNOSIS — K921 Melena: Secondary | ICD-10-CM | POA: Diagnosis not present

## 2023-09-21 DIAGNOSIS — D509 Iron deficiency anemia, unspecified: Secondary | ICD-10-CM

## 2023-09-21 DIAGNOSIS — E611 Iron deficiency: Secondary | ICD-10-CM

## 2023-09-21 MED ORDER — SODIUM CHLORIDE 0.9 % IV SOLN
300.0000 mg | INTRAVENOUS | Status: DC
  Administered 2023-09-21: 300 mg via INTRAVENOUS
  Filled 2023-09-21: qty 15

## 2023-09-21 NOTE — Progress Notes (Signed)
 Diagnosis: Iron Deficiency Anemia  Provider:  Chilton Greathouse MD  Procedure: IV Infusion  IV Type: Peripheral, IV Location: L Antecubital  Venofer (Iron Sucrose), Dose: 300 mg  Infusion Start Time: 1409  Infusion Stop Time: 1554  Post Infusion IV Care: Patient declined observation and Peripheral IV Discontinued  Discharge: Condition: Good, Destination: Home . AVS Declined  Performed by:  Rico Ala, LPN

## 2023-09-24 DIAGNOSIS — Z171 Estrogen receptor negative status [ER-]: Secondary | ICD-10-CM | POA: Diagnosis not present

## 2023-09-28 ENCOUNTER — Ambulatory Visit

## 2023-09-30 LAB — SIGNATERA ONLY (NATERA MANAGED)
SIGNATERA MTM READOUT: 0 MTM/ml
SIGNATERA TEST RESULT: NEGATIVE

## 2023-10-05 ENCOUNTER — Telehealth: Payer: Self-pay

## 2023-10-05 NOTE — Telephone Encounter (Signed)
 Called pt per MD to advise Signatera testing was negative/not detected. Pt verbalized understanding of results and knows [facility] will be in touch to schedule 3-6 mo repeat lab.

## 2023-10-11 ENCOUNTER — Encounter (HOSPITAL_COMMUNITY)
Admission: RE | Admit: 2023-10-11 | Discharge: 2023-10-11 | Disposition: A | Discharge status: Home / Self Care | Source: Ambulatory Visit | Attending: Hematology and Oncology | Admitting: Hematology and Oncology

## 2023-10-11 DIAGNOSIS — R1900 Intra-abdominal and pelvic swelling, mass and lump, unspecified site: Secondary | ICD-10-CM | POA: Diagnosis not present

## 2023-10-11 MED ORDER — TECHNETIUM TC 99M SULFUR COLLOID
4.0000 | Freq: Once | INTRAVENOUS | Status: AC
  Administered 2023-10-11: 4 via INTRAVENOUS

## 2023-10-22 ENCOUNTER — Other Ambulatory Visit: Payer: Self-pay

## 2023-10-22 DIAGNOSIS — C50411 Malignant neoplasm of upper-outer quadrant of right female breast: Secondary | ICD-10-CM

## 2023-10-22 DIAGNOSIS — E611 Iron deficiency: Secondary | ICD-10-CM

## 2023-10-23 ENCOUNTER — Inpatient Hospital Stay (HOSPITAL_BASED_OUTPATIENT_CLINIC_OR_DEPARTMENT_OTHER): Admitting: Hematology and Oncology

## 2023-10-23 ENCOUNTER — Inpatient Hospital Stay: Attending: Hematology and Oncology

## 2023-10-23 VITALS — BP 127/63 | HR 73 | Temp 98.1°F | Resp 16 | Ht 64.0 in | Wt 149.0 lb

## 2023-10-23 DIAGNOSIS — Z171 Estrogen receptor negative status [ER-]: Secondary | ICD-10-CM

## 2023-10-23 DIAGNOSIS — R1900 Intra-abdominal and pelvic swelling, mass and lump, unspecified site: Secondary | ICD-10-CM | POA: Diagnosis not present

## 2023-10-23 DIAGNOSIS — C50411 Malignant neoplasm of upper-outer quadrant of right female breast: Secondary | ICD-10-CM

## 2023-10-23 DIAGNOSIS — E611 Iron deficiency: Secondary | ICD-10-CM | POA: Diagnosis not present

## 2023-10-23 DIAGNOSIS — Z853 Personal history of malignant neoplasm of breast: Secondary | ICD-10-CM | POA: Diagnosis not present

## 2023-10-23 DIAGNOSIS — D509 Iron deficiency anemia, unspecified: Secondary | ICD-10-CM | POA: Diagnosis not present

## 2023-10-23 DIAGNOSIS — Z08 Encounter for follow-up examination after completed treatment for malignant neoplasm: Secondary | ICD-10-CM | POA: Insufficient documentation

## 2023-10-23 LAB — CBC WITH DIFFERENTIAL (CANCER CENTER ONLY)
Abs Immature Granulocytes: 0 10*3/uL (ref 0.00–0.07)
Basophils Absolute: 0 10*3/uL (ref 0.0–0.1)
Basophils Relative: 1 %
Eosinophils Absolute: 0 10*3/uL (ref 0.0–0.5)
Eosinophils Relative: 1 %
HCT: 38.2 % (ref 36.0–46.0)
Hemoglobin: 13 g/dL (ref 12.0–15.0)
Immature Granulocytes: 0 %
Lymphocytes Relative: 24 %
Lymphs Abs: 1 10*3/uL (ref 0.7–4.0)
MCH: 31 pg (ref 26.0–34.0)
MCHC: 34 g/dL (ref 30.0–36.0)
MCV: 91 fL (ref 80.0–100.0)
Monocytes Absolute: 0.2 10*3/uL (ref 0.1–1.0)
Monocytes Relative: 5 %
Neutro Abs: 3 10*3/uL (ref 1.7–7.7)
Neutrophils Relative %: 69 %
Platelet Count: 199 10*3/uL (ref 150–400)
RBC: 4.2 MIL/uL (ref 3.87–5.11)
RDW: 13.1 % (ref 11.5–15.5)
WBC Count: 4.3 10*3/uL (ref 4.0–10.5)
nRBC: 0 % (ref 0.0–0.2)

## 2023-10-23 LAB — CMP (CANCER CENTER ONLY)
ALT: 17 U/L (ref 0–44)
AST: 15 U/L (ref 15–41)
Albumin: 5.1 g/dL — ABNORMAL HIGH (ref 3.5–5.0)
Alkaline Phosphatase: 64 U/L (ref 38–126)
Anion gap: 5 (ref 5–15)
BUN: 15 mg/dL (ref 6–20)
CO2: 32 mmol/L (ref 22–32)
Calcium: 10.2 mg/dL (ref 8.9–10.3)
Chloride: 104 mmol/L (ref 98–111)
Creatinine: 0.77 mg/dL (ref 0.44–1.00)
GFR, Estimated: >60 mL/min (ref >60)
Glucose, Bld: 93 mg/dL (ref 70–99)
Potassium: 4.2 mmol/L (ref 3.5–5.1)
Sodium: 141 mmol/L (ref 135–145)
Total Bilirubin: 0.8 mg/dL (ref 0.0–1.2)
Total Protein: 7.5 g/dL (ref 6.5–8.1)

## 2023-10-23 LAB — IRON AND IRON BINDING CAPACITY (CC-WL,HP ONLY)
Iron: 104 ug/dL (ref 28–170)
Saturation Ratios: 29 % (ref 10.4–31.8)
TIBC: 363 ug/dL (ref 250–450)
UIBC: 259 ug/dL (ref 148–442)

## 2023-10-23 LAB — SAMPLE TO BLOOD BANK

## 2023-10-23 LAB — VITAMIN B12: Vitamin B-12: 1837 pg/mL — ABNORMAL HIGH (ref 180–914)

## 2023-10-23 LAB — FERRITIN: Ferritin: 86 ng/mL (ref 11–307)

## 2023-10-23 NOTE — Progress Notes (Signed)
 Patient Care Team: Jess Morita, MD as PCP - General (Family Medicine) Enid Harry, MD as Consulting Physician (General Surgery) Retta Caster, MD as Consulting Physician (Radiation Oncology) Devon Fogo, MD (Inactive) as Consulting Physician (Dermatology) Auther Bo, RN as Oncology Nurse Navigator Alane Hsu, RN as Oncology Nurse Navigator [place], Amy V, OD (Optometry) Cameron Cea, MD as Consulting Physician (Hematology and Oncology)  DIAGNOSIS:  Encounter Diagnosis  Name Primary?   Malignant neoplasm of upper-outer quadrant of right breast in female, estrogen receptor negative (HCC) Yes    SUMMARY OF ONCOLOGIC HISTORY: Oncology History  Malignant neoplasm of upper-outer quadrant of right breast in female, estrogen receptor negative (HCC)  05/03/2018 Initial Diagnosis   right breast upper outer quadrant and right axillary lymph node biopsy 05/03/2018 for a clinical T2 N2, stage IIIC invasive ductal carcinoma, triple negative, with an Ki-67 of 75%   06/03/2018 - 10/18/2018 Neo-Adjuvant Chemotherapy   Dose dense Adriamycin  and Cytoxan  followed by Taxol  and carboplatin  x12   11/14/2018 Surgery   Bilateral mastectomies: Left mastectomy: Fibrocystic changes, right mastectomy: Complete pathologic response no evidence of residual cancer, 0/13 lymph nodes negative   12/31/2018 - 02/10/2019 Radiation Therapy   Adjuvant XRT     CHIEF COMPLIANT: Follow-up of anemia and to discuss next steps in the evaluation of the mesenteric mass  HISTORY OF PRESENT ILLNESS:  History of Present Illness Darlene Grant is a 48 year old female who presents for follow-up regarding an incidental abdominal mass and anemia.  She is undergoing evaluation for an incidental abdominal mass discovered during a workup for anemia. A liver spleen scan indicated the mass was not a spleen. She is scheduled for a colonoscopy and endoscopy on the 20th of this month. There is  confusion regarding the radiologist's report, which referenced a CT scan from Oct 24, 2018, that she did not have. She had CT scans in December 2019 and November 2020, and seeks clarification on whether the mass has changed in size since those scans.  Anemia led to the discovery of the abdominal mass. She takes B12 supplements, 5000 mcg daily, to address her anemia. She is concerned about the possibility of needing a blood transfusion again and wants extra monitoring to prevent this. She is currently feeling better and her hemoglobin has returned to normal levels.  No symptoms related to the abdominal mass, which was found incidentally during the workup for anemia.     ALLERGIES:  is allergic to amoxicillin -pot clavulanate, hydrocodone, and keflex [cephalexin].  MEDICATIONS:  Current Outpatient Medications  Medication Sig Dispense Refill   Calcium Acetate-Magnesium Carb 450-200 MG TABS Take by mouth.     Cholecalciferol (VITAMIN D ) 125 MCG (5000 UT) CAPS Take 5,000 Units by mouth daily. 30 capsule    fluticasone  (FLONASE ) 50 MCG/ACT nasal spray USE 2 SPRAYS IN EACH NOSTRIL DAILY 48 g 3   loratadine  (CLARITIN ) 10 MG tablet Take 10 mg by mouth daily as needed for allergies.     Multiple Vitamins-Minerals (MULTIVITAMIN WITH MINERALS) tablet Take 1 tablet by mouth daily.     Na Sulfate-K Sulfate-Mg Sulfate concentrate (SUPREP) 17.5-3.13-1.6 GM/177ML SOLN Use as directed; may use generic; goodrx card if insurance will not cover generic 354 mL 0   cyclobenzaprine  (FLEXERIL ) 10 MG tablet Take 1 tablet (10 mg total) by mouth as needed for muscle spasms. 1/2 to I tab TID (Patient not taking: Reported on 10/23/2023) 30 tablet 2   hydrocortisone  2.5 % cream Apply topically as needed. (  Patient not taking: Reported on 10/23/2023) 30 g 2   Probiotic Product (PROBIOTIC DAILY PO) Take 1 tablet by mouth. (Patient not taking: Reported on 09/06/2023)     Turmeric 500 MG CAPS Take 1 capsule by mouth daily. (Patient not  taking: Reported on 10/23/2023)     No current facility-administered medications for this visit.    PHYSICAL EXAMINATION: ECOG PERFORMANCE STATUS: 1 - Symptomatic but completely ambulatory  Vitals:   10/23/23 0834  BP: 127/63  Pulse: 73  Resp: 16  Temp: 98.1 F (36.7 C)  SpO2: 100%   Filed Weights   10/23/23 0834  Weight: 149 lb (67.6 kg)      LABORATORY DATA:  I have reviewed the data as listed    Latest Ref Rng & Units 08/28/2023   11:23 AM 08/22/2023    2:50 PM 11/27/2022   11:29 AM  CMP  Glucose 70 - 99 mg/dL 99  161  89   BUN 6 - 20 mg/dL 21  10  17    Creatinine 0.44 - 1.00 mg/dL 0.96  0.45  4.09   Sodium 135 - 145 mmol/L 141  141  138   Potassium 3.5 - 5.1 mmol/L 3.7  4.6  5.0   Chloride 98 - 111 mmol/L 103  104  98   CO2 22 - 32 mmol/L 31  30  32   Calcium 8.9 - 10.3 mg/dL 9.5  9.6  81.1   Total Protein 6.5 - 8.1 g/dL 7.0   7.4   Total Bilirubin 0.0 - 1.2 mg/dL 0.8   1.2   Alkaline Phos 38 - 126 U/L 66   64   AST 15 - 41 U/L 12   13   ALT 0 - 44 U/L 12   11     Lab Results  Component Value Date   WBC 4.3 10/23/2023   HGB 13.0 10/23/2023   HCT 38.2 10/23/2023   MCV 91.0 10/23/2023   PLT 199 10/23/2023   NEUTROABS 3.0 10/23/2023    ASSESSMENT & PLAN:  Malignant neoplasm of upper-outer quadrant of right breast in female, estrogen receptor negative (HCC) 05/03/2018: right breast upper outer quadrant and right axillary lymph node biopsy 05/03/2018 for a clinical T2 N2, stage IIIC invasive ductal carcinoma, triple negative, with an Ki-67 75%, no deleterious genetic mutations   06/03/2018-10/18/2018: Neoadjuvant chemotherapy with dose dense Adriamycin  and Cytoxan  followed by Taxol  and carboplatin    11/14/2018: Bilateral mastectomies: Left mastectomy: Fibrocystic changes, right mastectomy: Complete pathologic response no evidence of residual cancer, 0/13 lymph nodes negative Completed adjuvant radiation  therapy ---------------------------------------------------------------------------------------------------------------------------------------------------------------  Treatment plan: Surveillance November 2020: CT CAP, CTneck, CT head: No evidence of metastatic disease.  Soft tissue edema in the right axilla 05/14/2019: Biopsy right axilla: Fibrosis no malignancy. Bone scan 05/20/2019: No evidence of bone metastases.   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 Breast/chest wall examination: 06/04/2023: Benign, no palpable lumps or nodules.  She has minor keloid on the left chest wall. 2. no role of imaging studies because she had bilateral mastectomies. 3.  Signatera: Negative  [facility] emergency room: Severe anemia: CT abdomen pelvis: Complex nonspecific 3.5 cm solid well-circumscribed left lower quadrant mesenteric mass.  PET CT scan 09/01/2023: Mesenteric mass does not demonstrate any hypermetabolic activity. Could be unusual extrarenal meta nephric tissue. No other evidence  of metastatic disease.  Liver spleen scan 10/11/2023: Soft tissue mass does not accumulate colloid radiotracer and therefore is not consistent with an accessory spleen  EGD and colonoscopy are pending with Dr. [name] I have sent a message to radiology to give me measurements on the mesenteric mass from 2020 to 2025 We will check her labs every 3 months. I will see her back in 6 months for follow-up.  Assessment & Plan Anemia Anemia likely due to iron  deficiency with normalized hemoglobin at 13 g/dL. Source of blood loss unclear; endoscopy and colonoscopy planned. B12 supplementation excessive at 5000 mcg daily. She wishes to avoid transfusions and requests monitoring. - Order endoscopy and colonoscopy on May 20. - Reduce B12  supplementation to 1000 mcg once weekly. - Schedule lab work every three months to monitor hemoglobin and iron  levels. - Schedule follow-up appointments every six months for hematology evaluation. - Provide additional monitoring and blood work if anemia symptoms recur.  Chemotherapy Not on active treatment. Incidental mesenteric mass found, initially suspected as spleen, but further imaging suggested otherwise. Radiology report error caused confusion. Conservative approach taken; second opinion considered if needed. - Request radiology to compare mesenteric mass size on PET CT from September 01, 2023, with CT scans from May 06, 2019, and May 29, 2018. - Consult with Dr. Delane Fear after next scan results to determine need for intervention. - Consider second opinion from academic center if necessary. - Discuss potential biopsy or laparoscopy if mass requires further investigation.      No orders of the defined types were placed in this encounter.  The patient has a good understanding of the overall plan. she agrees with it. she will call with any problems that may develop before the next visit here. Total time spent: 30 mins including face to face time and time spent for planning, charting and co-ordination of care   Margert Sheerer, MD 10/23/23

## 2023-10-23 NOTE — Assessment & Plan Note (Signed)
 05/03/2018: right breast upper outer quadrant and right axillary lymph node biopsy 05/03/2018 for a clinical T2 N2, stage IIIC invasive ductal carcinoma, triple negative, with an Ki-67 75%, no deleterious genetic mutations   06/03/2018-10/18/2018: Neoadjuvant chemotherapy with dose dense Adriamycin  and Cytoxan  followed by Taxol  and carboplatin    11/14/2018: Bilateral mastectomies: Left mastectomy: Fibrocystic changes, right mastectomy: Complete pathologic response no evidence of residual cancer, 0/13 lymph nodes negative Completed adjuvant radiation therapy ---------------------------------------------------------------------------------------------------------------------------------------------------------------  Treatment plan: Surveillance November 2020: CT CAP, CTneck, CT head: No evidence of metastatic disease.  Soft tissue edema in the right axilla 05/14/2019: Biopsy right axilla: Fibrosis no malignancy. Bone scan 05/20/2019: No evidence of bone metastases.   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 Breast/chest wall examination: 06/04/2023: Benign, no palpable lumps or nodules.  She has minor keloid on the left chest wall. 2. no role of imaging studies because she had bilateral mastectomies. 3.  Signatera: Negative  [facility] emergency room: Severe anemia: CT abdomen pelvis: Complex nonspecific 3.5 cm solid well-circumscribed left lower quadrant mesenteric mass.  PET CT scan 09/01/2023: Mesenteric mass does not demonstrate any hypermetabolic activity. Could be unusual extrarenal meta nephric tissue. No other evidence of metastatic disease.  Liver spleen scan 10/11/2023: Soft tissue mass does not accumulate colloid radiotracer and therefore is not consistent with an accessory spleen  EGD and colonoscopy are  pending with Dr. [name] We will request Dr. Delane Fear to evaluate the patient

## 2023-10-24 ENCOUNTER — Telehealth: Payer: Self-pay | Admitting: Hematology and Oncology

## 2023-10-24 NOTE — Telephone Encounter (Signed)
 I left a voicemail for the patient with a PET CT scan was read by Dr. Mancil Seat and it was amended to show that the mesenteric mass is unchanged for the last 5 to 6 years.  We greatly appreciate Dr. Gallerani for reviewing it.

## 2023-11-06 ENCOUNTER — Encounter: Payer: Self-pay | Admitting: Internal Medicine

## 2023-11-06 ENCOUNTER — Ambulatory Visit (AMBULATORY_SURGERY_CENTER): Admitting: Internal Medicine

## 2023-11-06 VITALS — BP 156/91 | HR 84 | Temp 98.3°F | Resp 10 | Ht 64.0 in | Wt 149.0 lb

## 2023-11-06 DIAGNOSIS — K648 Other hemorrhoids: Secondary | ICD-10-CM

## 2023-11-06 DIAGNOSIS — K2950 Unspecified chronic gastritis without bleeding: Secondary | ICD-10-CM

## 2023-11-06 DIAGNOSIS — K3189 Other diseases of stomach and duodenum: Secondary | ICD-10-CM | POA: Diagnosis not present

## 2023-11-06 DIAGNOSIS — K2951 Unspecified chronic gastritis with bleeding: Secondary | ICD-10-CM | POA: Diagnosis not present

## 2023-11-06 DIAGNOSIS — D509 Iron deficiency anemia, unspecified: Secondary | ICD-10-CM

## 2023-11-06 DIAGNOSIS — K2270 Barrett's esophagus without dysplasia: Secondary | ICD-10-CM | POA: Diagnosis not present

## 2023-11-06 DIAGNOSIS — K921 Melena: Secondary | ICD-10-CM

## 2023-11-06 DIAGNOSIS — K317 Polyp of stomach and duodenum: Secondary | ICD-10-CM

## 2023-11-06 DIAGNOSIS — Z1211 Encounter for screening for malignant neoplasm of colon: Secondary | ICD-10-CM | POA: Diagnosis not present

## 2023-11-06 MED ORDER — SODIUM CHLORIDE 0.9 % IV SOLN
500.0000 mL | Freq: Once | INTRAVENOUS | Status: DC
Start: 2023-11-06 — End: 2023-11-06

## 2023-11-06 NOTE — Op Note (Signed)
 [facility] Patient Name: Darlene Grant Procedure Date: 11/06/2023 1:35 PM MRN: [id number] Endoscopist: Pedro Bourgeois , , [id number] Age: 48 Referring MD:  Date of Birth: 11-07-1975 Gender: Female Account #: [id number] Procedure:                Upper GI endoscopy Indications:              Iron  deficiency anemia Medicines:                Monitored Anesthesia Care Procedure:                Pre-Anesthesia Assessment:                           - Prior to the procedure, a History and Physical                            was performed, and patient medications and                            allergies were reviewed. The patient's tolerance of                            previous anesthesia was also reviewed. The risks                            and benefits of the procedure and the sedation                            options and risks were discussed with the patient.                            All questions were answered, and informed consent                            was obtained. Prior Anticoagulants: The patient has                            taken no anticoagulant or antiplatelet agents. ASA                            Grade Assessment: III - A patient with severe                            systemic disease. After reviewing the risks and                            benefits, the patient was deemed in satisfactory                            condition to undergo the procedure.                           After obtaining informed consent, the endoscope was  passed under direct vision. Throughout the                            procedure, the patient's blood pressure, pulse, and                            oxygen saturations were monitored continuously. The                            Olympus scope [id number] was introduced through the                            mouth, and advanced to the second part of duodenum.                            The upper  GI endoscopy was accomplished without                            difficulty. The patient tolerated the procedure                            well. Scope In: Scope Out: Findings:                 Salmon-colored mucosa was present. The maximum                            longitudinal extent of these esophageal mucosal                            changes was 1 cm in length. Mucosa was biopsied                            with a cold forceps for histology. One specimen                            bottle was sent to pathology.                           Multiple sessile polyps (benign fundic gland                            polyps) with no bleeding and no stigmata of recent                            bleeding were found in the gastric body.                           Localized mildly erythematous mucosa without                            bleeding was found in the gastric antrum. Biopsies                            were taken with  a cold forceps for histology.                           The examined duodenum was normal. Biopsies were                            taken with a cold forceps for histology. Complications:            No immediate complications. Estimated Blood Loss:     Estimated blood loss was minimal. Impression:               - Salmon-colored mucosa. Biopsied.                           - Multiple gastric polyps (benign fundic gland                            polyps).                           - Erythematous mucosa in the antrum. Biopsied.                           - Normal examined duodenum. Biopsied. Recommendation:           - Await pathology results.                           - Perform a colonoscopy today. Dr Pedro Bourgeois "[address]" [place],  11/06/2023 2:09:36 PM

## 2023-11-06 NOTE — Patient Instructions (Signed)
 Resume all of your previous medications today as ordered. Read your discharge instructions. We will send you a letter with your pathology reports usually within 10 days.   YOU HAD AN ENDOSCOPIC PROCEDURE TODAY AT THE [facility]:   Refer to the procedure report that was given to you for any specific questions about what was found during the examination.  If the procedure report does not answer your questions, please call your gastroenterologist to clarify.  If you requested that your care partner not be given the details of your procedure findings, then the procedure report has been included in a sealed envelope for you to review at your convenience later.  YOU SHOULD EXPECT: Some feelings of bloating in the abdomen. Passage of more gas than usual.  Walking can help get rid of the air that was put into your GI tract during the procedure and reduce the bloating. If you had a lower endoscopy (such as a colonoscopy or flexible sigmoidoscopy) you may notice spotting of blood in your stool or on the toilet paper. If you underwent a bowel prep for your procedure, you may not have a normal bowel movement for a few days.  Please Note:  You might notice some irritation and congestion in your nose or some drainage.  This is from the oxygen used during your procedure.  There is no need for concern and it should clear up in a day or so.  SYMPTOMS TO REPORT IMMEDIATELY:  Following lower endoscopy (colonoscopy or flexible sigmoidoscopy):  Excessive amounts of blood in the stool  Significant tenderness or worsening of abdominal pains  Swelling of the abdomen that is new, acute  Fever of 100F or higher  Following upper endoscopy (EGD)  Vomiting of blood or coffee ground material  New chest pain or pain under the shoulder blades  Painful or persistently difficult swallowing  New shortness of breath  Fever of 100F or higher  Black, tarry-looking stools  For urgent or emergent issues, a  gastroenterologist can be reached at any hour by calling [phone]. Do not use MyChart messaging for urgent concerns.    DIET:  We do recommend a small meal at first, but then you may proceed to your regular diet.  Drink plenty of fluids but you should avoid alcoholic beverages for 24 hours.  ACTIVITY:  You should plan to take it easy for the rest of today and you should NOT DRIVE or use heavy machinery until tomorrow (because of the sedation medicines used during the test).    FOLLOW UP: Our staff will call the number listed on your records the next business day following your procedure.  We will call around 7:15- 8:00 am to check on you and address any questions or concerns that you may have regarding the information given to you following your procedure. If we do not reach you, we will leave a message.     If any biopsies were taken you will be contacted by phone or by letter within the next 1-3 weeks.  Please call us  at [phone] if you have not heard about the biopsies in 3 weeks.    SIGNATURES/CONFIDENTIALITY: You and/or your care partner have signed paperwork which will be entered into your electronic medical record.  These signatures attest to the fact that that the information above on your After Visit Summary has been reviewed and is understood.  Full responsibility of the confidentiality of this discharge information lies with you and/or your care-partner.

## 2023-11-06 NOTE — Progress Notes (Signed)
 Pt's states no medical or surgical changes since previsit or office visit.

## 2023-11-06 NOTE — Op Note (Signed)
 [facility] Patient Name: Darlene Grant Procedure Date: 11/06/2023 1:34 PM MRN: [id number] Endoscopist: Pedro Bourgeois , , [id number] Age: 48 Referring MD:  Date of Birth: March 12, 1976 Gender: Female Account #: [id number] Procedure:                Colonoscopy Indications:              Screening for colorectal malignant neoplasm, This                            is the patient's first colonoscopy Medicines:                Monitored Anesthesia Care Procedure:                Pre-Anesthesia Assessment:                           - Prior to the procedure, a History and Physical                            was performed, and patient medications and                            allergies were reviewed. The patient's tolerance of                            previous anesthesia was also reviewed. The risks                            and benefits of the procedure and the sedation                            options and risks were discussed with the patient.                            All questions were answered, and informed consent                            was obtained. Prior Anticoagulants: The patient has                            taken no anticoagulant or antiplatelet agents. ASA                            Grade Assessment: III - A patient with severe                            systemic disease. After reviewing the risks and                            benefits, the patient was deemed in satisfactory                            condition to undergo the procedure.  After obtaining informed consent, the colonoscope                            was passed under direct vision. Throughout the                            procedure, the patient's blood pressure, pulse, and                            oxygen saturations were monitored continuously. The                            CF [id number] #[id number] was introduced through the anus                             and advanced to the the terminal ileum. The                            colonoscopy was performed without difficulty. The                            patient tolerated the procedure well. The quality                            of the bowel preparation was excellent. The                            terminal ileum, ileocecal valve, appendiceal                            orifice, and rectum were photographed. Scope In: 1:50:28 PM Scope Out: 2:03:30 PM Scope Withdrawal Time: 0 hours 10 minutes 13 seconds  Total Procedure Duration: 0 hours 13 minutes 2 seconds  Findings:                 The terminal ileum appeared normal.                           Non-bleeding internal hemorrhoids were found during                            retroflexion. Complications:            No immediate complications. Estimated Blood Loss:     Estimated blood loss: none. Impression:               - The examined portion of the ileum was normal.                           - Non-bleeding internal hemorrhoids.                           - No specimens collected. Recommendation:           - Discharge patient to home (with escort).                           -  Repeat colonoscopy in 10 years for screening                            purposes.                           - The findings and recommendations were discussed                            with the patient. Dr Pedro Bourgeois "[name]" [name],  11/06/2023 2:11:37 PM

## 2023-11-06 NOTE — Progress Notes (Signed)
 Called to room to assist during endoscopic procedure.  Patient ID and intended procedure confirmed with present staff. Received instructions for my participation in the procedure from the performing physician.

## 2023-11-06 NOTE — Progress Notes (Signed)
 Report to PACU, RN, vss, BBS= Clear.

## 2023-11-06 NOTE — Progress Notes (Signed)
 GASTROENTEROLOGY PROCEDURE H&P NOTE   Primary Care Physician: Jess Morita, MD    Reason for Procedure:   Colon cancer screening, IDA, melena  Plan:    EGD/colonoscopy  Patient is appropriate for endoscopic procedure(s) in the ambulatory ([facility]) setting.  The nature of the procedure, as well as the risks, benefits, and alternatives were carefully and thoroughly reviewed with the patient. Ample time for discussion and questions allowed. The patient understood, was satisfied, and agreed to proceed.     HPI: Darlene Grant is a 48 y.o. female who presents for EGD/colonoscopy for evaluation of IDA, melena, colon cancer screening.  Patient was most recently seen in the Gastroenterology Clinic on 09/06/23.  No interval change in medical history since that appointment. Please refer to that note for full details regarding GI history and clinical presentation.   Past Medical History:  Diagnosis Date   Abnormal Pap smear    ASCUS 03/13/11   Anemia    Breast cancer (HCC)    Complication of anesthesia    pt states that all narcotics make her angry and she would prefer to avoid them.   Grade I hemorrhoids 02/23/2017   Piriformis syndrome of left side 01/15/2018   Vaginal Pap smear, abnormal     Past Surgical History:  Procedure Laterality Date   BREAST BIOPSY Right 8/16   CESAREAN SECTION N/A 11/21/2013   Procedure: CESAREAN SECTION;  Surgeon: Mckinley Spells, MD;  Location: [facility];  Service: Obstetrics;  Laterality: N/A;   CESAREAN SECTION N/A 06/23/2016   Procedure: CESAREAN SECTION;  Surgeon: Ana Balling, MD;  Location: [facility];  Service: Obstetrics;  Laterality: N/A;   HAND SURGERY  04/2012   ligament repair   MASTECTOMY WITH AXILLARY LYMPH NODE DISSECTION Bilateral 11/14/2018   Procedure: RIGHT MASTECTOMY WITH RIGHT AXILLARY LYMPH NODE DISSECTION AND LEFT RISK REDUCING MASTECTOMY;  Surgeon: Enid Harry, MD;  Location: [facility];   Service: General;  Laterality: Bilateral;  LATE ENTRY: Corrected laterality documentation to read right axillary lymph node dissection vs. documented laterality of left.     PILONIDAL CYST EXCISION  2000   PORTACATH PLACEMENT N/A 05/28/2018   Procedure: INSERTION PORT-A-CATH WITH ULTRASOUND;  Surgeon: Enid Harry, MD;  Location: [facility];  Service: General;  Laterality: N/A;   WISDOM TOOTH EXTRACTION  AGE 77    Prior to Admission medications   Medication Sig Start Date End Date Taking? Authorizing Provider  Calcium Acetate-Magnesium Carb 450-200 MG TABS Take by mouth.    [provider]  Cholecalciferol (VITAMIN D ) 125 MCG (5000 UT) CAPS Take 5,000 Units by mouth daily. 11/24/19   Gudena, Vinay, MD  cyclobenzaprine  (FLEXERIL ) 10 MG tablet Take 1 tablet (10 mg total) by mouth as needed for muscle spasms. 1/2 to I tab TID Patient not taking: Reported on 10/23/2023 12/14/22   Tabori, Katherine E, MD  fluticasone  (FLONASE ) 50 MCG/ACT nasal spray USE 2 SPRAYS IN EACH NOSTRIL DAILY 12/14/21   Tabori, Katherine E, MD  hydrocortisone  2.5 % cream Apply topically as needed. Patient not taking: Reported on 10/23/2023 12/14/22   Tabori, Katherine E, MD  loratadine  (CLARITIN ) 10 MG tablet Take 10 mg by mouth daily as needed for allergies.    [provider]  Multiple Vitamins-Minerals (MULTIVITAMIN WITH MINERALS) tablet Take 1 tablet by mouth daily.    [provider]  Na Sulfate-K Sulfate-Mg Sulfate concentrate (SUPREP) 17.5-3.13-1.6 GM/177ML SOLN Use as directed; may use generic; goodrx card  if insurance will not cover generic 09/06/23   Daina Drum, MD  Probiotic Product (PROBIOTIC DAILY PO) Take 1 tablet by mouth. Patient not taking: Reported on 09/06/2023    [provider]  Turmeric 500 MG CAPS Take 1 capsule by mouth daily. Patient not taking: Reported on 10/23/2023 11/24/19   Gudena, Vinay, MD    Current Outpatient Medications  Medication Sig  Dispense Refill   Calcium Acetate-Magnesium Carb 450-200 MG TABS Take by mouth.     Cholecalciferol (VITAMIN D ) 125 MCG (5000 UT) CAPS Take 5,000 Units by mouth daily. 30 capsule    cyclobenzaprine  (FLEXERIL ) 10 MG tablet Take 1 tablet (10 mg total) by mouth as needed for muscle spasms. 1/2 to I tab TID (Patient not taking: Reported on 10/23/2023) 30 tablet 2   fluticasone  (FLONASE ) 50 MCG/ACT nasal spray USE 2 SPRAYS IN EACH NOSTRIL DAILY 48 g 3   hydrocortisone  2.5 % cream Apply topically as needed. (Patient not taking: Reported on 10/23/2023) 30 g 2   loratadine  (CLARITIN ) 10 MG tablet Take 10 mg by mouth daily as needed for allergies.     Multiple Vitamins-Minerals (MULTIVITAMIN WITH MINERALS) tablet Take 1 tablet by mouth daily.     Na Sulfate-K Sulfate-Mg Sulfate concentrate (SUPREP) 17.5-3.13-1.6 GM/177ML SOLN Use as directed; may use generic; goodrx card if insurance will not cover generic 354 mL 0   Probiotic Product (PROBIOTIC DAILY PO) Take 1 tablet by mouth. (Patient not taking: Reported on 09/06/2023)     Turmeric 500 MG CAPS Take 1 capsule by mouth daily. (Patient not taking: Reported on 10/23/2023)     Current Facility-Administered Medications  Medication Dose Route Frequency Provider Last Rate Last Admin   0.9 %  sodium chloride  infusion  500 mL Intravenous Once Daina Drum, MD        Allergies as of 11/06/2023 - Review Complete 11/06/2023  Allergen Reaction Noted   Amoxicillin -pot clavulanate Diarrhea 08/19/2012   Hydrocodone Itching 07/05/2012   Keflex [cephalexin] Diarrhea 11/19/2013    Family History  Problem Relation Age of Onset   Breast cancer Mother 47   Hypertension Father    Colitis Father    Stomach cancer Maternal Grandmother    Skin cancer Maternal Grandmother    Depression Maternal Grandmother    Lung cancer Maternal Grandfather        lung   Liver disease Neg Hx    Esophageal cancer Neg Hx    Colon cancer Neg Hx     Social History   Socioeconomic  History   Marital status: Married    Spouse name: Arnetta Lank   Number of children: 2   Years of education: Not on file   Highest education level: Not on file  Occupational History   Occupation: occupational therapist  Tobacco Use   Smoking status: Never   Smokeless tobacco: Never  Vaping Use   Vaping status: Never Used  Substance and Sexual Activity   Alcohol use: Yes    Alcohol/week: 0.0 standard drinks of alcohol    Comment: occassionally   Drug use: No   Sexual activity: Yes    Partners: Male    Birth control/protection: None    Comment: Vasectomy   Other Topics Concern   Not on file  Social History Narrative   Marital status/children/pets: Married. 2 children.   Education/employment: Masters degree. Employed as an Acupuncturist.   Safety:      -Wears a bicycle helmet riding a bike: Yes     -  smoke alarm in the home:Yes     - wears seatbelt: Yes     - Feels safe in their relationships: Yes   Social Drivers of Corporate investment banker Strain: Not on file  Food Insecurity: Not on file  Transportation Needs: Not on file  Physical Activity: Not on file  Stress: Not on file  Social Connections: Not on file  Intimate Partner Violence: Not on file    Physical Exam: Vital signs in last 24 hours: BP 125/85   Pulse 78   Temp 98.3 F (36.8 C)   Ht 5\' 4"  (1.626 m)   Wt 149 lb (67.6 kg)   SpO2 100%   BMI 25.58 kg/m  GEN: NAD EYE: Sclerae anicteric ENT: MMM CV: Non-tachycardic Pulm: No increased WOB GI: Soft NEURO:  Alert & Oriented   Regino Caprio, MD [facility]   11/06/2023 1:07 PM

## 2023-11-07 ENCOUNTER — Telehealth: Payer: Self-pay | Admitting: Lactation Services

## 2023-11-07 NOTE — Telephone Encounter (Signed)
  Follow up Call-     11/06/2023    1:07 PM  Call back number  Post procedure Call Back phone  # [phone]  Permission to leave phone message Yes     Patient questions:  Do you have a fever, pain , or abdominal swelling? No. Pain Score  0 *  Have you tolerated food without any problems? Yes.    Have you been able to return to your normal activities? Yes.    Do you have any questions about your discharge instructions: Diet   No. Medications  No. Follow up visit  No.  Do you have questions or concerns about your Care? No.  Actions: * If pain score is 4 or above: No action needed, pain <4.

## 2023-11-09 ENCOUNTER — Encounter: Payer: Self-pay | Admitting: Internal Medicine

## 2023-11-09 ENCOUNTER — Ambulatory Visit: Payer: Self-pay | Admitting: Internal Medicine

## 2023-11-09 ENCOUNTER — Other Ambulatory Visit: Payer: Self-pay

## 2023-11-09 LAB — SURGICAL PATHOLOGY

## 2023-11-09 MED ORDER — HYDROCORTISONE (PERIANAL) 2.5 % EX CREA: 1.0000 | TOPICAL_CREAM | Freq: Two times a day (BID) | CUTANEOUS | 0 refills | Status: AC

## 2023-11-30 ENCOUNTER — Encounter: Payer: Self-pay | Admitting: Family Medicine

## 2023-11-30 ENCOUNTER — Ambulatory Visit (INDEPENDENT_AMBULATORY_CARE_PROVIDER_SITE_OTHER): Payer: BC Managed Care – PPO | Admitting: Family Medicine

## 2023-11-30 VITALS — BP 126/84 | HR 89 | Temp 98.3°F | Ht 64.0 in | Wt 146.7 lb

## 2023-11-30 DIAGNOSIS — E663 Overweight: Secondary | ICD-10-CM | POA: Diagnosis not present

## 2023-11-30 DIAGNOSIS — D509 Iron deficiency anemia, unspecified: Secondary | ICD-10-CM | POA: Diagnosis not present

## 2023-11-30 DIAGNOSIS — Z0000 Encounter for general adult medical examination without abnormal findings: Secondary | ICD-10-CM | POA: Diagnosis not present

## 2023-11-30 LAB — BASIC METABOLIC PANEL WITH GFR
BUN: 13 mg/dL (ref 6–23)
CO2: 31 meq/L (ref 19–32)
Calcium: 10.4 mg/dL (ref 8.4–10.5)
Chloride: 101 meq/L (ref 96–112)
Creatinine, Ser: 0.73 mg/dL (ref 0.40–1.20)
GFR: 98.93 mL/min (ref >60.00)
Glucose, Bld: 85 mg/dL (ref 70–99)
Potassium: 4.5 meq/L (ref 3.5–5.1)
Sodium: 140 meq/L (ref 135–145)

## 2023-11-30 LAB — HEPATIC FUNCTION PANEL
ALT: 16 U/L (ref 0–35)
AST: 14 U/L (ref 0–37)
Albumin: 5 g/dL (ref 3.5–5.2)
Alkaline Phosphatase: 56 U/L (ref 39–117)
Bilirubin, Direct: 0.2 mg/dL (ref 0.0–0.3)
Total Bilirubin: 0.9 mg/dL (ref 0.2–1.2)
Total Protein: 7.5 g/dL (ref 6.0–8.3)

## 2023-11-30 LAB — CBC WITH DIFFERENTIAL/PLATELET
Basophils Absolute: 0 10*3/uL (ref 0.0–0.1)
Basophils Relative: 0.3 % (ref 0.0–3.0)
Eosinophils Absolute: 0 10*3/uL (ref 0.0–0.7)
Eosinophils Relative: 0.8 % (ref 0.0–5.0)
HCT: 39.7 % (ref 36.0–46.0)
Hemoglobin: 13.3 g/dL (ref 12.0–15.0)
Lymphocytes Relative: 27.9 % (ref 12.0–46.0)
Lymphs Abs: 1.2 10*3/uL (ref 0.7–4.0)
MCHC: 33.6 g/dL (ref 30.0–36.0)
MCV: 90.5 fl (ref 78.0–100.0)
Monocytes Absolute: 0.2 10*3/uL (ref 0.1–1.0)
Monocytes Relative: 5.4 % (ref 3.0–12.0)
Neutro Abs: 2.9 10*3/uL (ref 1.4–7.7)
Neutrophils Relative %: 65.6 % (ref 43.0–77.0)
Platelets: 218 10*3/uL (ref 150.0–400.0)
RBC: 4.39 Mil/uL (ref 3.87–5.11)
RDW: 13.7 % (ref 11.5–15.5)
WBC: 4.4 10*3/uL (ref 4.0–10.5)

## 2023-11-30 LAB — LIPID PANEL
Cholesterol: 187 mg/dL (ref 0–200)
HDL: 69.6 mg/dL (ref >39.00)
LDL Cholesterol: 102 mg/dL — ABNORMAL HIGH (ref 0–99)
NonHDL: 117.88
Total CHOL/HDL Ratio: 3
Triglycerides: 78 mg/dL (ref 0.0–149.0)
VLDL: 15.6 mg/dL (ref 0.0–40.0)

## 2023-11-30 MED ORDER — HYDROCORTISONE ACETATE 25 MG RE SUPP: 25.0000 mg | Freq: Two times a day (BID) | RECTAL | 0 refills | Status: AC

## 2023-11-30 NOTE — Patient Instructions (Signed)
 Follow up in 1 year or as needed We'll notify you of your lab results and make any changes if needed Keep up the good work on healthy diet and regular exercise- you look great! Call with any questions or concerns Stay Safe!  Stay Healthy! Have a great summer!! HAPPY EARLY BIRTHDAY!!

## 2023-11-30 NOTE — Progress Notes (Unsigned)
   Subjective:    Patient ID: Darlene Grant, female    DOB: 21-Jan-1976, 48 y.o.   MRN: [id number]  HPI CPE- UTD on Tdap, pap, cologuard  Patient Care Team    Relationship Specialty Notifications Start End  Jess Morita, MD PCP - General Family Medicine  04/20/20   Enid Harry, MD Consulting Physician General Surgery  05/10/18   Retta Caster, MD Consulting Physician Radiation Oncology  05/10/18   Devon Fogo, MD (Inactive) Consulting Physician Dermatology  05/15/18   Auther Bo, RN Oncology Nurse Navigator   10/04/18   Alane Hsu, RN Oncology Nurse Navigator   10/04/18   Verta Goring, OD  Optometry  01/15/20   Cameron Cea, MD Consulting Physician Hematology and Oncology  04/20/20      Health Maintenance  Topic Date Due   Hepatitis C Screening  Never done   Pneumococcal Vaccine 50-22 Years old (1 of 2 - PCV) Never done   COVID-19 Vaccine (6 - 2024-25 season) 12/16/2023 (Originally 05/24/2023)   INFLUENZA VACCINE  01/18/2024   DTaP/Tdap/Td (2 - Td or Tdap) 02/23/2025   Cervical Cancer Screening (HPV/Pap Cotest)  03/14/2026   Fecal DNA (Cologuard)  07/11/2026   HIV Screening  Completed   HPV VACCINES  Aged Out   Meningococcal B Vaccine  Aged Out   Colonoscopy  Discontinued      Review of Systems Patient reports no vision/ hearing changes, adenopathy,fever, weight change,  persistant/recurrent hoarseness , swallowing issues, chest pain, palpitations, edema, persistant/recurrent cough, hemoptysis, dyspnea (rest/exertional/paroxysmal nocturnal), gastrointestinal bleeding (melena, rectal bleeding), abdominal pain, significant heartburn, bowel changes, GU symptoms (dysuria, hematuria, incontinence), Gyn symptoms (abnormal  bleeding, pain),  syncope, focal weakness, memory loss, numbness & tingling, skin/hair/nail changes, abnormal bruising or bleeding, anxiety, or depression.     Objective:   Physical Exam General Appearance:    Alert,  cooperative, no distress, appears stated age  Head:    Normocephalic, without obvious abnormality, atraumatic  Eyes:    PERRL, conjunctiva/corneas clear, EOM's intact both eyes  Ears:    Normal TM's and external ear canals, both ears  Nose:   Nares normal, septum midline, mucosa normal, no drainage    or sinus tenderness  Throat:   Lips, mucosa, and tongue normal; teeth and gums normal  Neck:   Supple, symmetrical, trachea midline, no adenopathy;    Thyroid : no enlargement/tenderness/nodules  Back:     Symmetric, no curvature, ROM normal, no CVA tenderness  Lungs:     Clear to auscultation bilaterally, respirations unlabored  Chest Wall:    No tenderness or deformity   Heart:    Regular rate and rhythm, S1 and S2 normal, no murmur, rub   or gallop  Breast Exam:    Deferred to GYN  Abdomen:     Soft, non-tender, bowel sounds active all four quadrants,    no masses, no organomegaly  Genitalia:    Deferred to GYN  Rectal:    Extremities:   Extremities normal, atraumatic, no cyanosis or edema  Pulses:   2+ and symmetric all extremities  Skin:   Skin color, texture, turgor normal, no rashes or lesions  Lymph nodes:   Cervical, supraclavicular, and axillary nodes normal  Neurologic:   CNII-XII intact, normal strength, sensation and reflexes    throughout          Assessment & Plan:

## 2023-12-01 LAB — IBC + FERRITIN
Ferritin: 44.8 ng/mL (ref 10.0–291.0)
Iron: 97 ug/dL (ref 42–145)
Saturation Ratios: 27 % (ref 20.0–50.0)
TIBC: 359.8 ug/dL (ref 250.0–450.0)
Transferrin: 257 mg/dL (ref 212.0–360.0)

## 2023-12-01 LAB — B12 AND FOLATE PANEL
Folate: >23.2 ng/mL (ref >5.9)
Vitamin B-12: 591 pg/mL (ref 211–911)

## 2023-12-01 LAB — TSH: TSH: 0.38 u[IU]/mL (ref 0.35–5.50)

## 2023-12-01 NOTE — Assessment & Plan Note (Signed)
 Pt's PE WNL.  UTD on cologuard (actually had colonoscopy during anemia workup), pap, Tdap.  No need for mammogram due to bilateral mastectomy.  Check labs.  Anticipatory guidance provided.

## 2023-12-03 ENCOUNTER — Ambulatory Visit: Payer: Self-pay | Admitting: Family Medicine

## 2023-12-03 NOTE — Progress Notes (Signed)
 Pt has reviewed labs via MyChart

## 2023-12-06 ENCOUNTER — Ambulatory Visit: Admitting: Family Medicine

## 2023-12-12 DIAGNOSIS — D225 Melanocytic nevi of trunk: Secondary | ICD-10-CM | POA: Diagnosis not present

## 2023-12-12 DIAGNOSIS — L579 Skin changes due to chronic exposure to nonionizing radiation, unspecified: Secondary | ICD-10-CM | POA: Diagnosis not present

## 2023-12-12 DIAGNOSIS — D485 Neoplasm of uncertain behavior of skin: Secondary | ICD-10-CM | POA: Diagnosis not present

## 2023-12-12 DIAGNOSIS — D2272 Melanocytic nevi of left lower limb, including hip: Secondary | ICD-10-CM | POA: Diagnosis not present

## 2023-12-12 DIAGNOSIS — L814 Other melanin hyperpigmentation: Secondary | ICD-10-CM | POA: Diagnosis not present

## 2023-12-26 ENCOUNTER — Telehealth: Admitting: Physician Assistant

## 2023-12-26 DIAGNOSIS — R3989 Other symptoms and signs involving the genitourinary system: Secondary | ICD-10-CM

## 2023-12-26 MED ORDER — CIPROFLOXACIN HCL 500 MG PO TABS: 500.0000 mg | ORAL_TABLET | Freq: Two times a day (BID) | ORAL | 0 refills | Status: AC

## 2023-12-26 NOTE — Progress Notes (Signed)
 Virtual Visit Consent   Darlene Grant, you are scheduled for a virtual visit with a [facility] provider today. Just as with appointments in the office, your consent must be obtained to participate. Your consent will be active for this visit and any virtual visit you may have with one of our providers in the next 365 days. If you have a MyChart account, a copy of this consent can be sent to you electronically.  As this is a virtual visit, video technology does not allow for your provider to perform a traditional examination. This may limit your provider's ability to fully assess your condition. If your provider identifies any concerns that need to be evaluated in person or the need to arrange testing (such as labs, EKG, etc.), we will make arrangements to do so. Although advances in technology are sophisticated, we cannot ensure that it will always work on either your end or our end. If the connection with a video visit is poor, the visit may have to be switched to a telephone visit. With either a video or telephone visit, we are not always able to ensure that we have a secure connection.  By engaging in this virtual visit, you consent to the provision of healthcare and authorize for your insurance to be billed (if applicable) for the services provided during this visit. Depending on your insurance coverage, you may receive a charge related to this service.  I need to obtain your verbal consent now. Are you willing to proceed with your visit today? Darlene Grant has provided verbal consent on 12/26/2023 for a virtual visit (video or telephone). Darlene Grant, [place]  Date: 12/26/2023 8:45 AM   Virtual Visit via Video Note   I, Darlene Grant, connected with  Darlene Grant  ([id number], 1975/07/09) on 12/26/23 at  8:30 AM EDT by a video-enabled telemedicine application and verified that I am speaking with the correct person using two  identifiers.  Location: Patient: Virtual Visit Location Patient: Home Provider: Virtual Visit Location Provider: Home Office   I discussed the limitations of evaluation and management by telemedicine and the availability of in person appointments. The patient expressed understanding and agreed to proceed.    History of Present Illness: Darlene Grant is a 48 y.o. who identifies as a female who was assigned female at birth, and is being seen today for some lingering UTI symptoms. Notes she was evaluated 5 days ago by telehealth service offered through her work for a few days of dysuria, urgency and frequency. Was diagnosed with UTI and started on a 5-day course of Bactrim. Notes 2 days into treatment the dysuria resolved but still with urgency/frequency that has continued. Over past day with mild recurrence of dysuria itself and development of nausea and mild stomach upset. Denies fever, chills, vomiting, back or belly pain. Denies hematuria. Called PCP office but her PCP is out this month due to husband having surgery.   HPI: HPI  Problems:  Patient Active Problem List   Diagnosis Date Noted   Iron  deficiency anemia 09/07/2023   Overweight (BMI 25.0-29.9) 11/27/2022   Physical exam 11/10/2020   lymphedema, right arm 01/15/2020   LAD (lymphadenopathy), submandibular left 01/15/2020   Other fatigue 01/15/2020   Chemotherapy induced neutropenia (HCC) 09/13/2018   Malignant neoplasm of upper-outer quadrant of right breast in female, estrogen receptor negative (HCC) 05/09/2018    Allergies:  Allergies  Allergen Reactions   Amoxicillin -Pot Clavulanate Diarrhea    diarrhea  Hydrocodone Itching   Keflex [Cephalexin] Diarrhea   Medications:  Current Outpatient Medications:    ciprofloxacin  (CIPRO ) 500 MG tablet, Take 1 tablet (500 mg total) by mouth 2 (two) times daily for 3 days., Disp: 6 tablet, Rfl: 0   Calcium Acetate-Magnesium Carb 450-200 MG TABS, Take by mouth., Disp:  , Rfl:    Cholecalciferol (VITAMIN D ) 125 MCG (5000 UT) CAPS, Take 5,000 Units by mouth daily., Disp: 30 capsule, Rfl:    cyclobenzaprine  (FLEXERIL ) 10 MG tablet, Take 1 tablet (10 mg total) by mouth as needed for muscle spasms. 1/2 to I tab TID, Disp: 30 tablet, Rfl: 2   fluticasone  (FLONASE ) 50 MCG/ACT nasal spray, USE 2 SPRAYS IN EACH NOSTRIL DAILY, Disp: 48 g, Rfl: 3   hydrocortisone  (ANUSOL -HC) 25 MG suppository, Place 1 suppository (25 mg total) rectally 2 (two) times daily., Disp: 12 suppository, Rfl: 0   hydrocortisone  2.5 % cream, Apply topically as needed., Disp: 30 g, Rfl: 2   loratadine  (CLARITIN ) 10 MG tablet, Take 10 mg by mouth daily as needed for allergies., Disp: , Rfl:    Multiple Vitamins-Minerals (MULTIVITAMIN WITH MINERALS) tablet, Take 1 tablet by mouth daily., Disp: , Rfl:    Probiotic Product (PROBIOTIC DAILY PO), Take 1 tablet by mouth., Disp: , Rfl:    Turmeric 500 MG CAPS, Take 1 capsule by mouth daily., Disp: , Rfl:   Observations/Objective: Patient is well-developed, well-nourished in no acute distress.  Resting comfortably  at home.  Head is normocephalic, atraumatic.  No labored breathing.  Speech is clear and coherent with logical content.  Patient is alert and oriented at baseline.   Assessment and Plan: 1. Suspected UTI (Primary) - ciprofloxacin  (CIPRO ) 500 MG tablet; Take 1 tablet (500 mg total) by mouth 2 (two) times daily for 3 days.  Dispense: 6 tablet; Refill: 0  No alarm signs or symptoms present. Stop Bactrim. Start Cipro  500 mg BID x 3 days. Will need in-person eval for exam and culture for any non-resolving, new or worsening symptoms.   Follow Up Instructions: I discussed the assessment and treatment plan with the patient. The patient was provided an opportunity to ask questions and all were answered. The patient agreed with the plan and demonstrated an understanding of the instructions.  A copy of instructions were sent to the patient via MyChart  unless otherwise noted below.   The patient was advised to call back or seek an in-person evaluation if the symptoms worsen or if the condition fails to improve as anticipated.    Darlene Velma Lunger, PA-C

## 2023-12-26 NOTE — Patient Instructions (Signed)
 Darlene Grant, thank you for joining Darlene Velma Lunger, PA-C for today's virtual visit.  While this provider is not your primary care provider (PCP), if your PCP is located in our provider database this encounter information will be shared with them immediately following your visit.   A [facility] MyChart account gives you access to today's visit and all your visits, tests, and labs performed at [facility]  click here if you don't have a [facility] MyChart account or go to [email or web address]  Consent: (Patient) Darlene Grant provided verbal consent for this virtual visit at the beginning of the encounter.  Current Medications:  Current Outpatient Medications:    Calcium Acetate-Magnesium Carb 450-200 MG TABS, Take by mouth., Disp: , Rfl:    Cholecalciferol (VITAMIN D ) 125 MCG (5000 UT) CAPS, Take 5,000 Units by mouth daily., Disp: 30 capsule, Rfl:    cyclobenzaprine  (FLEXERIL ) 10 MG tablet, Take 1 tablet (10 mg total) by mouth as needed for muscle spasms. 1/2 to I tab TID, Disp: 30 tablet, Rfl: 2   fluticasone  (FLONASE ) 50 MCG/ACT nasal spray, USE 2 SPRAYS IN EACH NOSTRIL DAILY, Disp: 48 g, Rfl: 3   hydrocortisone  (ANUSOL -HC) 25 MG suppository, Place 1 suppository (25 mg total) rectally 2 (two) times daily., Disp: 12 suppository, Rfl: 0   hydrocortisone  2.5 % cream, Apply topically as needed., Disp: 30 g, Rfl: 2   loratadine  (CLARITIN ) 10 MG tablet, Take 10 mg by mouth daily as needed for allergies., Disp: , Rfl:    Multiple Vitamins-Minerals (MULTIVITAMIN WITH MINERALS) tablet, Take 1 tablet by mouth daily., Disp: , Rfl:    Probiotic Product (PROBIOTIC DAILY PO), Take 1 tablet by mouth., Disp: , Rfl:    Turmeric 500 MG CAPS, Take 1 capsule by mouth daily., Disp: , Rfl:    Medications ordered in this encounter:  No orders of the defined types were placed in this encounter.    *If you need refills on other medications prior to your next  appointment, please contact your pharmacy*  Follow-Up: Call back or seek an in-person evaluation if the symptoms worsen or if the condition fails to improve as anticipated.  [facility] [phone]  Other Instructions Your symptoms are consistent with a bladder infection, also called acute cystitis. Please take your antibiotic (Cipro ) as directed until all pills are gone.  Stay very well hydrated.  Consider a daily probiotic (Align, Culturelle, or Activia) to help prevent stomach upset caused by the antibiotic.  Taking a probiotic daily may also help prevent recurrent UTIs.  Also consider taking AZO (Phenazopyridine) tablets to help decrease pain with urination.    Urinary Tract Infection A urinary tract infection (UTI) can occur any place along the urinary tract. The tract includes the kidneys, ureters, bladder, and urethra. A type of germ called bacteria often causes a UTI. UTIs are often helped with antibiotic medicine.  HOME CARE  If given, take antibiotics as told by your doctor. Finish them even if you start to feel better. Drink enough fluids to keep your pee (urine) clear or pale yellow. Avoid tea, drinks with caffeine, and bubbly (carbonated) drinks. Pee often. Avoid holding your pee in for a long time. Pee before and after having sex (intercourse). Wipe from front to back after you poop (bowel movement) if you are a woman. Use each tissue only once. GET HELP RIGHT AWAY IF:  You have back pain. You have lower belly (abdominal) pain. You have chills. You feel sick to  your stomach (nauseous). You throw up (vomit). Your burning or discomfort with peeing does not go away. You have a fever. Your symptoms are not better in 3 days. MAKE SURE YOU:  Understand these instructions. Will watch your condition. Will get help right away if you are not doing well or get worse. Document Released: 11/22/2007 Document Revised: 02/28/2012 Document Reviewed: 01/04/2012 [facility]  Patient Information 2015 [place], [place]. This information is not intended to replace advice given to you by your health care provider. Make sure you discuss any questions you have with your health care provider.    If you have been instructed to have an in-person evaluation today at a local Urgent Care facility, please use the link below. It will take you to a list of all of our available [facility], including address, phone number and hours of operation. Please do not delay care.  [facility]  If you or a family member do not have a primary care provider, use the link below to schedule a visit and establish care. When you choose a [facility] primary care physician or advanced practice provider, you gain a long-term partner in health. Find a Primary Care Provider  Learn more about [facility]'s in-office and virtual care options:  - Get Care Now

## 2023-12-29 ENCOUNTER — Ambulatory Visit
Admission: RE | Admit: 2023-12-29 | Discharge: 2023-12-29 | Disposition: A | Discharge status: Home / Self Care | Source: Ambulatory Visit | Attending: Family Medicine | Admitting: Family Medicine

## 2023-12-29 ENCOUNTER — Other Ambulatory Visit: Payer: Self-pay

## 2023-12-29 VITALS — BP 133/83 | HR 102 | Temp 98.7°F | Resp 17

## 2023-12-29 DIAGNOSIS — R000 Tachycardia, unspecified: Secondary | ICD-10-CM | POA: Insufficient documentation

## 2023-12-29 DIAGNOSIS — R300 Dysuria: Secondary | ICD-10-CM | POA: Diagnosis not present

## 2023-12-29 DIAGNOSIS — R350 Frequency of micturition: Secondary | ICD-10-CM | POA: Diagnosis not present

## 2023-12-29 LAB — POCT URINALYSIS DIP (MANUAL ENTRY)
Bilirubin, UA: NEGATIVE
Blood, UA: NEGATIVE
Glucose, UA: NEGATIVE mg/dL
Ketones, POC UA: NEGATIVE mg/dL
Leukocytes, UA: NEGATIVE
Nitrite, UA: NEGATIVE
Protein Ur, POC: NEGATIVE mg/dL
Spec Grav, UA: 1.01 (ref 1.010–1.025)
Urobilinogen, UA: 0.2 U/dL
pH, UA: 7 (ref 5.0–8.0)

## 2023-12-29 NOTE — ED Provider Notes (Signed)
 Darlene Grant    CSN: [id number] Arrival date & time: 12/29/23  0913      History   Chief Complaint Chief Complaint  Patient presents with   Urinary Frequency   Dysuria    HPI Darlene Grant is a 48 y.o. female.   HPI  Past Medical History:  Diagnosis Date   Abnormal Pap smear    ASCUS 03/13/11   Anemia    Breast cancer (HCC)    Complication of anesthesia    pt states that all narcotics make her angry and she would prefer to avoid them.   Grade I hemorrhoids 02/23/2017   Piriformis syndrome of left side 01/15/2018   Vaginal Pap smear, abnormal     Patient Active Problem List   Diagnosis Date Noted   Iron  deficiency anemia 09/07/2023   Overweight (BMI 25.0-29.9) 11/27/2022   Physical exam 11/10/2020   lymphedema, right arm 01/15/2020   LAD (lymphadenopathy), submandibular left 01/15/2020   Other fatigue 01/15/2020   Chemotherapy induced neutropenia (HCC) 09/13/2018   Malignant neoplasm of upper-outer quadrant of right breast in female, estrogen receptor negative (HCC) 05/09/2018    Past Surgical History:  Procedure Laterality Date   BREAST BIOPSY Right 8/16   CESAREAN SECTION N/A 11/21/2013   Procedure: CESAREAN SECTION;  Surgeon: Nena DELENA App, MD;  Location: [facility];  Service: Obstetrics;  Laterality: N/A;   CESAREAN SECTION N/A 06/23/2016   Procedure: CESAREAN SECTION;  Surgeon: Harland JAYSON Birkenhead, MD;  Location: [facility];  Service: Obstetrics;  Laterality: N/A;   HAND SURGERY  04/2012   ligament repair   MASTECTOMY WITH AXILLARY LYMPH NODE DISSECTION Bilateral 11/14/2018   Procedure: RIGHT MASTECTOMY WITH RIGHT AXILLARY LYMPH NODE DISSECTION AND LEFT RISK REDUCING MASTECTOMY;  Surgeon: Ebbie Cough, MD;  Location: [facility];  Service: General;  Laterality: Bilateral;  LATE ENTRY: Corrected laterality documentation to read right axillary lymph node dissection vs. documented laterality of left.     PILONIDAL CYST  EXCISION  2000   PORTACATH PLACEMENT N/A 05/28/2018   Procedure: INSERTION PORT-A-CATH WITH ULTRASOUND;  Surgeon: Ebbie Cough, MD;  Location: [facility];  Service: General;  Laterality: N/A;   WISDOM TOOTH EXTRACTION  AGE 39    OB History     Gravida  2   Para  2   Term  2   Preterm      AB      Living  2      SAB      IAB      Ectopic      Multiple  0   Live Births  2            Home Medications    Prior to Admission medications   Medication Sig Start Date End Date Taking? Authorizing Provider  Calcium Acetate-Magnesium Carb 450-200 MG TABS Take by mouth.    [provider]  Cholecalciferol (VITAMIN D ) 125 MCG (5000 UT) CAPS Take 5,000 Units by mouth daily. 11/24/19   Gudena, Vinay, MD  ciprofloxacin  (CIPRO ) 500 MG tablet Take 1 tablet (500 mg total) by mouth 2 (two) times daily for 3 days. 12/26/23 12/29/23  Gladis Elsie JAYSON, PA-C  cyclobenzaprine  (FLEXERIL ) 10 MG tablet Take 1 tablet (10 mg total) by mouth as needed for muscle spasms. 1/2 to I tab TID 12/14/22   Tabori, Katherine E, MD  fluticasone  (FLONASE ) 50 MCG/ACT nasal spray USE 2 SPRAYS IN EACH NOSTRIL DAILY 12/14/21  Mahlon Comer BRAVO, MD  hydrocortisone  (ANUSOL -HC) 25 MG suppository Place 1 suppository (25 mg total) rectally 2 (two) times daily. 11/30/23   Tabori, Katherine E, MD  hydrocortisone  2.5 % cream Apply topically as needed. 12/14/22   Tabori, Katherine E, MD  loratadine  (CLARITIN ) 10 MG tablet Take 10 mg by mouth daily as needed for allergies.    [provider]  Multiple Vitamins-Minerals (MULTIVITAMIN WITH MINERALS) tablet Take 1 tablet by mouth daily.    [provider]  Probiotic Product (PROBIOTIC DAILY PO) Take 1 tablet by mouth.    [provider]  Turmeric 500 MG CAPS Take 1 capsule by mouth daily. 11/24/19   Odean Potts, MD    Family History Family History  Problem Relation Age of Onset   Breast cancer Mother 64   Colon  polyps Father    Hypertension Father    Colitis Father    Stomach cancer Maternal Grandmother    Skin cancer Maternal Grandmother    Depression Maternal Grandmother    Lung cancer Maternal Grandfather        lung   Liver disease Neg Hx    Esophageal cancer Neg Hx    Colon cancer Neg Hx    Rectal cancer Neg Hx     Social History Social History   Tobacco Use   Smoking status: Never   Smokeless tobacco: Never  Vaping Use   Vaping status: Never Used  Substance Use Topics   Alcohol use: Yes    Alcohol/week: 0.0 standard drinks of alcohol    Comment: occassionally   Drug use: No     Allergies   Amoxicillin -pot clavulanate, Hydrocodone, and Keflex [cephalexin]   Review of Systems Review of Systems  Genitourinary:  Positive for frequency and urgency.  All other systems reviewed and are negative.    Physical Exam Triage Vital Signs ED Triage Vitals  Encounter Vitals Group     BP      Girls Systolic BP Percentile      Girls Diastolic BP Percentile      Boys Systolic BP Percentile      Boys Diastolic BP Percentile      Pulse      Resp      Temp      Temp src      SpO2      Weight      Height      Head Circumference      Peak Flow      Pain Score      Pain Loc      Pain Education      Exclude from Growth Chart    No data found.  Updated Vital Signs BP 133/83 (BP Location: Left Arm)   Pulse (!) 102   Temp 98.7 F (37.1 C) (Oral)   Resp 17   SpO2 97%    Physical Exam Vitals and nursing note reviewed.  Constitutional:      Appearance: Normal appearance. She is normal weight.  HENT:     Head: Normocephalic and atraumatic.     Mouth/Throat:     Mouth: Mucous membranes are moist.     Pharynx: Oropharynx is clear.  Eyes:     Extraocular Movements: Extraocular movements intact.     Conjunctiva/sclera: Conjunctivae normal.     Pupils: Pupils are equal, round, and reactive to light.  Cardiovascular:     Rate and Rhythm: Normal rate and regular  rhythm.     Pulses: Normal  pulses.     Heart sounds: Normal heart sounds.  Pulmonary:     Effort: Pulmonary effort is normal.     Breath sounds: Normal breath sounds. No wheezing, rhonchi or rales.  Musculoskeletal:        General: Normal range of motion.  Skin:    General: Skin is warm and dry.  Neurological:     General: No focal deficit present.     Mental Status: She is alert and oriented to person, place, and time. Mental status is at baseline.  Psychiatric:        Mood and Affect: Mood normal.        Behavior: Behavior normal.      UC Treatments / Results  Labs (all labs ordered are listed, but only abnormal results are displayed) Labs Reviewed  CBC WITH DIFFERENTIAL/PLATELET  POCT URINALYSIS DIP (MANUAL ENTRY)  CERVICOVAGINAL ANCILLARY ONLY    EKG   Radiology No results found.  Procedures Procedures (including critical Grant time)  Medications Ordered in UC Medications - No data to display  Initial Impression / Assessment and Plan / UC Course  I have reviewed the triage vital signs and the nursing notes.  Pertinent labs & imaging results that were available during my Grant of the patient were reviewed by me and considered in my medical decision making (see chart for details).     MDM: 1.  Urinary frequency-UA revealed above; 2.  Tachycardia-CBC with differential ordered.  Patient request CBC with differential as reports when she is tachycardic she feels anemic and is concerned with anemia.  Labs drawn by PCP 4 weeks ago were completely within normal limits. Advised patient we will follow-up with lab results tomorrow, 12/30/2023.  Advised patient urinalysis was completely normal.  Advised if symptoms worsen and/or unresolved please follow-up with your PCP or [facility] urology for further evaluation.  Prior to discharge today patient request Aptima swab be done for possible vaginal candidiasis.  Patient also reports that she believes OTC AZO is also causing her  anemia symptoms. Final Clinical Impressions(s) / UC Diagnoses   Final diagnoses:  Urinary frequency  Tachycardia     Discharge Instructions      Advised patient we will follow-up with lab results tomorrow, 12/30/2023.  Advised patient urinalysis was completely normal.  Advised if symptoms worsen and/or unresolved please follow-up with your PCP or [facility] urology for further evaluation.     ED Prescriptions   None    PDMP not reviewed this encounter.   Teddy Sharper, FNP 12/29/23 1014

## 2023-12-29 NOTE — Discharge Instructions (Addendum)
 Advised patient we will follow-up with lab results tomorrow, 12/30/2023.  Advised patient urinalysis was completely normal.  Advised if symptoms worsen and/or unresolved please follow-up with your PCP or [facility] urology for further evaluation.

## 2023-12-29 NOTE — ED Triage Notes (Signed)
 Pt c/o dysuria since 7/5. Virtual visit with insurance, tx with Bactrim. No improvement after a few days. Telehealth with [facility] on 7/9, switched to Cipro  for 3 days. Still no improvement. Also feeling anemic which she has a hx of.

## 2023-12-30 LAB — CBC WITH DIFFERENTIAL/PLATELET
Basophils Absolute: 0 x10E3/uL (ref 0.0–0.2)
Basos: 0 %
EOS (ABSOLUTE): 0 x10E3/uL (ref 0.0–0.4)
Eos: 1 %
Hematocrit: 38.5 % (ref 34.0–46.6)
Hemoglobin: 12.4 g/dL (ref 11.1–15.9)
Immature Grans (Abs): 0 x10E3/uL (ref 0.0–0.1)
Immature Granulocytes: 0 %
Lymphocytes Absolute: 1.2 x10E3/uL (ref 0.7–3.1)
Lymphs: 25 %
MCH: 30.6 pg (ref 26.6–33.0)
MCHC: 32.2 g/dL (ref 31.5–35.7)
MCV: 95 fL (ref 79–97)
Monocytes Absolute: 0.3 x10E3/uL (ref 0.1–0.9)
Monocytes: 6 %
Neutrophils Absolute: 3.4 x10E3/uL (ref 1.4–7.0)
Neutrophils: 68 %
Platelets: 213 x10E3/uL (ref 150–450)
RBC: 4.05 x10E6/uL (ref 3.77–5.28)
RDW: 12.8 % (ref 11.7–15.4)
WBC: 5 x10E3/uL (ref 3.4–10.8)

## 2023-12-31 ENCOUNTER — Encounter: Payer: Self-pay | Admitting: Obstetrics & Gynecology

## 2023-12-31 LAB — CERVICOVAGINAL ANCILLARY ONLY
Bacterial Vaginitis (gardnerella): NEGATIVE
Candida Glabrata: NEGATIVE
Candida Vaginitis: NEGATIVE
Chlamydia: NEGATIVE
Comment: NEGATIVE
Comment: NEGATIVE
Comment: NEGATIVE
Comment: NEGATIVE
Comment: NEGATIVE
Comment: NORMAL
Neisseria Gonorrhea: NEGATIVE
Trichomonas: NEGATIVE

## 2024-01-04 DIAGNOSIS — Z171 Estrogen receptor negative status [ER-]: Secondary | ICD-10-CM | POA: Diagnosis not present

## 2024-01-04 DIAGNOSIS — C50411 Malignant neoplasm of upper-outer quadrant of right female breast: Secondary | ICD-10-CM | POA: Diagnosis not present

## 2024-01-23 ENCOUNTER — Other Ambulatory Visit: Payer: Self-pay | Admitting: *Deleted

## 2024-01-23 ENCOUNTER — Inpatient Hospital Stay: Attending: Hematology and Oncology

## 2024-01-23 ENCOUNTER — Encounter: Payer: Self-pay | Admitting: Hematology and Oncology

## 2024-01-23 DIAGNOSIS — Z79899 Other long term (current) drug therapy: Secondary | ICD-10-CM | POA: Insufficient documentation

## 2024-01-23 DIAGNOSIS — Z08 Encounter for follow-up examination after completed treatment for malignant neoplasm: Secondary | ICD-10-CM | POA: Diagnosis not present

## 2024-01-23 DIAGNOSIS — Z853 Personal history of malignant neoplasm of breast: Secondary | ICD-10-CM | POA: Insufficient documentation

## 2024-01-23 DIAGNOSIS — D509 Iron deficiency anemia, unspecified: Secondary | ICD-10-CM | POA: Diagnosis not present

## 2024-01-23 DIAGNOSIS — E611 Iron deficiency: Secondary | ICD-10-CM

## 2024-01-23 LAB — CBC WITH DIFFERENTIAL (CANCER CENTER ONLY)
Abs Immature Granulocytes: 0.01 K/uL (ref 0.00–0.07)
Basophils Absolute: 0 K/uL (ref 0.0–0.1)
Basophils Relative: 0 %
Eosinophils Absolute: 0.1 K/uL (ref 0.0–0.5)
Eosinophils Relative: 2 %
HCT: 35.1 % — ABNORMAL LOW (ref 36.0–46.0)
Hemoglobin: 12.2 g/dL (ref 12.0–15.0)
Immature Granulocytes: 0 %
Lymphocytes Relative: 27 %
Lymphs Abs: 1.1 K/uL (ref 0.7–4.0)
MCH: 31.9 pg (ref 26.0–34.0)
MCHC: 34.8 g/dL (ref 30.0–36.0)
MCV: 91.6 fL (ref 80.0–100.0)
Monocytes Absolute: 0.3 K/uL (ref 0.1–1.0)
Monocytes Relative: 7 %
Neutro Abs: 2.6 K/uL (ref 1.7–7.7)
Neutrophils Relative %: 64 %
Platelet Count: 210 K/uL (ref 150–400)
RBC: 3.83 MIL/uL — ABNORMAL LOW (ref 3.87–5.11)
RDW: 13.6 % (ref 11.5–15.5)
WBC Count: 4.1 K/uL (ref 4.0–10.5)
nRBC: 0 % (ref 0.0–0.2)

## 2024-01-23 LAB — CMP (CANCER CENTER ONLY)
ALT: 13 U/L (ref 0–44)
AST: 13 U/L — ABNORMAL LOW (ref 15–41)
Albumin: 4.8 g/dL (ref 3.5–5.0)
Alkaline Phosphatase: 63 U/L (ref 38–126)
Anion gap: 5 (ref 5–15)
BUN: 13 mg/dL (ref 6–20)
CO2: 31 mmol/L (ref 22–32)
Calcium: 9.5 mg/dL (ref 8.9–10.3)
Chloride: 103 mmol/L (ref 98–111)
Creatinine: 0.71 mg/dL (ref 0.44–1.00)
GFR, Estimated: >60 mL/min (ref >60)
Glucose, Bld: 81 mg/dL (ref 70–99)
Potassium: 3.7 mmol/L (ref 3.5–5.1)
Sodium: 139 mmol/L (ref 135–145)
Total Bilirubin: 1.2 mg/dL (ref 0.0–1.2)
Total Protein: 7.1 g/dL (ref 6.5–8.1)

## 2024-01-23 LAB — IRON AND IRON BINDING CAPACITY (CC-WL,HP ONLY)
Iron: 125 ug/dL (ref 28–170)
Saturation Ratios: 37 % — ABNORMAL HIGH (ref 10.4–31.8)
TIBC: 342 ug/dL (ref 250–450)
UIBC: 217 ug/dL (ref 148–442)

## 2024-01-23 LAB — VITAMIN B12: Vitamin B-12: 957 pg/mL — ABNORMAL HIGH (ref 180–914)

## 2024-01-23 LAB — FERRITIN: Ferritin: 78 ng/mL (ref 11–307)

## 2024-01-23 NOTE — Assessment & Plan Note (Deleted)
 05/03/2018: right breast upper outer quadrant and right axillary lymph node biopsy 05/03/2018 for a clinical T2 N2, stage IIIC invasive ductal carcinoma, triple negative, with an Ki-67 75%, no deleterious genetic mutations   06/03/2018-10/18/2018: Neoadjuvant chemotherapy with dose dense Adriamycin  and Cytoxan  followed by Taxol  and carboplatin    11/14/2018: Bilateral mastectomies: Left mastectomy: Fibrocystic changes, right mastectomy: Complete pathologic response no evidence of residual cancer, 0/13 lymph nodes negative Completed adjuvant radiation therapy ---------------------------------------------------------------------------------------------------------------------------------------------------------------  Treatment plan: Surveillance November 2020: CT CAP, CTneck, CT head: No evidence of metastatic disease.  Soft tissue edema in the right axilla 05/14/2019: Biopsy right axilla: Fibrosis no malignancy. Bone scan 05/20/2019: No evidence of bone metastases.   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 Breast/chest wall examination: 06/04/2023: Benign, no palpable lumps or nodules.  She has minor keloid on the left chest wall. 2. no role of imaging studies because she had bilateral mastectomies. 3.  Signatera: Negative   [facility] emergency room: Severe anemia: CT abdomen pelvis: Complex nonspecific 3.5 cm solid well-circumscribed left lower quadrant mesenteric mass.   PET CT scan 09/01/2023: Mesenteric mass does not demonstrate any hypermetabolic activity. Could be unusual extrarenal meta nephric tissue. No other evidence of metastatic disease.  Liver spleen scan 10/11/2023: Soft tissue mass does not accumulate colloid radiotracer and therefore is not consistent with an accessory spleen   EGD and colonoscopy are  pending with Dr. Federico  We will check her labs every 3 months. I will see her back in 6 months for follow-up.

## 2024-01-24 ENCOUNTER — Inpatient Hospital Stay: Admitting: Hematology and Oncology

## 2024-01-24 DIAGNOSIS — C50411 Malignant neoplasm of upper-outer quadrant of right female breast: Secondary | ICD-10-CM

## 2024-01-29 ENCOUNTER — Telehealth: Admitting: Physician Assistant

## 2024-01-29 DIAGNOSIS — R3989 Other symptoms and signs involving the genitourinary system: Secondary | ICD-10-CM | POA: Diagnosis not present

## 2024-01-29 MED ORDER — SULFAMETHOXAZOLE-TRIMETHOPRIM 800-160 MG PO TABS: 1.0000 | ORAL_TABLET | Freq: Two times a day (BID) | ORAL | 0 refills | Status: DC

## 2024-01-29 MED ORDER — FLUCONAZOLE 150 MG PO TABS: ORAL_TABLET | ORAL | 0 refills | Status: DC

## 2024-01-29 NOTE — Patient Instructions (Addendum)
 Tambria K Beatrice-Mitchell, thank you for joining [name], PA-C for today's virtual visit.  While this provider is not your primary care provider (PCP), if your PCP is located in our provider database this encounter information will be shared with them immediately following your visit.   A [facility] MyChart account gives you access to today's visit and all your visits, tests, and labs performed at [facility]  click here if you don't have a [facility] MyChart account or go to [email or web address]  Consent: (Patient) Uniqua K Beatrice-Mitchell provided verbal consent for this virtual visit at the beginning of the encounter.  Current Medications:  Current Outpatient Medications:    fluconazole  (DIFLUCAN ) 150 MG tablet, Take 1 tablet PO once. Repeat in 3 days if needed., Disp: 2 tablet, Rfl: 0   sulfamethoxazole -trimethoprim  (BACTRIM  DS) 800-160 MG tablet, Take 1 tablet by mouth 2 (two) times daily., Disp: 10 tablet, Rfl: 0   Calcium Acetate-Magnesium Carb 450-200 MG TABS, Take by mouth., Disp: , Rfl:    Cholecalciferol (VITAMIN D ) 125 MCG (5000 UT) CAPS, Take 5,000 Units by mouth daily., Disp: 30 capsule, Rfl:    cyclobenzaprine  (FLEXERIL ) 10 MG tablet, Take 1 tablet (10 mg total) by mouth as needed for muscle spasms. 1/2 to I tab TID, Disp: 30 tablet, Rfl: 2   fluticasone  (FLONASE ) 50 MCG/ACT nasal spray, USE 2 SPRAYS IN EACH NOSTRIL DAILY, Disp: 48 g, Rfl: 3   hydrocortisone  (ANUSOL -HC) 25 MG suppository, Place 1 suppository (25 mg total) rectally 2 (two) times daily., Disp: 12 suppository, Rfl: 0   hydrocortisone  2.5 % cream, Apply topically as needed., Disp: 30 g, Rfl: 2   loratadine  (CLARITIN ) 10 MG tablet, Take 10 mg by mouth daily as needed for allergies., Disp: , Rfl:    Multiple Vitamins-Minerals (MULTIVITAMIN WITH MINERALS) tablet, Take 1 tablet by mouth daily., Disp: , Rfl:    Probiotic Product (PROBIOTIC DAILY PO), Take 1 tablet by mouth., Disp: ,  Rfl:    Turmeric 500 MG CAPS, Take 1 capsule by mouth daily., Disp: , Rfl:    Medications ordered in this encounter:  Meds ordered this encounter  Medications   sulfamethoxazole -trimethoprim  (BACTRIM  DS) 800-160 MG tablet    Sig: Take 1 tablet by mouth 2 (two) times daily.    Dispense:  10 tablet    Refill:  0    Supervising Provider:   LAMPTEY, PHILIP O [[id number]]   fluconazole  (DIFLUCAN ) 150 MG tablet    Sig: Take 1 tablet PO once. Repeat in 3 days if needed.    Dispense:  2 tablet    Refill:  0    Supervising Provider:   LAMPTEY, PHILIP O [[id number]]     *If you need refills on other medications prior to your next appointment, please contact your pharmacy*  Follow-Up: Call back or seek an in-person evaluation if the symptoms worsen or if the condition fails to improve as anticipated.  [facility] [phone]  Other Instructions Your symptoms are consistent with a bladder infection, also called acute cystitis. Please take your antibiotic (Bactrim ) as directed until all pills are gone.  Stay very well hydrated.  Consider a daily probiotic (Align, Culturelle, or Activia) to help prevent stomach upset caused by the antibiotic.  Taking a probiotic daily may also help prevent recurrent UTIs.  Also consider taking AZO (Phenazopyridine) tablets to help decrease pain with urination. If you note any non-resolving, new, or worsening symptoms despite treatment, please seek an in-person evaluation ASAP.  Urinary Tract Infection A urinary tract infection (UTI) can occur any place along the urinary tract. The tract includes the kidneys, ureters, bladder, and urethra. A type of germ called bacteria often causes a UTI. UTIs are often helped with antibiotic medicine.  HOME CARE  If given, take antibiotics as told by your doctor. Finish them even if you start to feel better. Drink enough fluids to keep your pee (urine) clear or pale yellow. Avoid tea, drinks with caffeine, and bubbly  (carbonated) drinks. Pee often. Avoid holding your pee in for a long time. Pee before and after having sex (intercourse). Wipe from front to back after you poop (bowel movement) if you are a woman. Use each tissue only once. GET HELP RIGHT AWAY IF:  You have back pain. You have lower belly (abdominal) pain. You have chills. You feel sick to your stomach (nauseous). You throw up (vomit). Your burning or discomfort with peeing does not go away. You have a fever. Your symptoms are not better in 3 days. MAKE SURE YOU:  Understand these instructions. Will watch your condition. Will get help right away if you are not doing well or get worse. Document Released: 11/22/2007 Document Revised: 02/28/2012 Document Reviewed: 01/04/2012 [facility] Patient Information 2015 [place], [place]. This information is not intended to replace advice given to you by your health care provider. Make sure you discuss any questions you have with your health care provider.    If you have been instructed to have an in-person evaluation today at a local Urgent Care facility, please use the link below. It will take you to a list of all of our available [facility], including address, phone number and hours of operation. Please do not delay care.  [facility]  If you or a family member do not have a primary care provider, use the link below to schedule a visit and establish care. When you choose a [facility] primary care physician or advanced practice provider, you gain a long-term partner in health. Find a Primary Care Provider  Learn more about [facility]'s in-office and virtual care options: [facility] - Get Care Now

## 2024-01-29 NOTE — Progress Notes (Signed)
 Virtual Visit Consent   Darlene Grant, you are scheduled for a virtual visit with a [facility] provider today. Just as with appointments in the office, your consent must be obtained to participate. Your consent will be active for this visit and any virtual visit you may have with one of our providers in the next 365 days. If you have a MyChart account, a copy of this consent can be sent to you electronically.  As this is a virtual visit, video technology does not allow for your provider to perform a traditional examination. This may limit your provider's ability to fully assess your condition. If your provider identifies any concerns that need to be evaluated in person or the need to arrange testing (such as labs, EKG, etc.), we will make arrangements to do so. Although advances in technology are sophisticated, we cannot ensure that it will always work on either your end or our end. If the connection with a video visit is poor, the visit may have to be switched to a telephone visit. With either a video or telephone visit, we are not always able to ensure that we have a secure connection.  By engaging in this virtual visit, you consent to the provision of healthcare and authorize for your insurance to be billed (if applicable) for the services provided during this visit. Depending on your insurance coverage, you may receive a charge related to this service.  I need to obtain your verbal consent now. Are you willing to proceed with your visit today? Darlene Grant has provided verbal consent on 01/29/2024 for a virtual visit (video or telephone). Darlene Grant, [place]  Date: 01/29/2024 7:58 AM   Virtual Visit via Video Note   I, Darlene Grant, connected with  Darlene Grant  ([id number], 02-20-1976) on 01/29/24 at  7:45 AM EDT by a video-enabled telemedicine application and verified that I am speaking with the correct person using two  identifiers.  Location: Patient: Virtual Visit Location Patient: Home Provider: Virtual Visit Location Provider: Home Office   I discussed the limitations of evaluation and management by telemedicine and the availability of in person appointments. The patient expressed understanding and agreed to proceed.    History of Present Illness: Darlene Grant is a 48 y.o. who identifies as a female who was assigned female at birth, and is being seen today for possible UTI. Endorses symptoms starting last night with dysuria, nausea, urgency/frequency, malaise. Denies fever, chills, flank pain. Denies vomiting.  HPI: HPI  Problems:  Patient Active Problem List   Diagnosis Date Noted   Iron  deficiency anemia 09/07/2023   Overweight (BMI 25.0-29.9) 11/27/2022   Physical exam 11/10/2020   lymphedema, right arm 01/15/2020   LAD (lymphadenopathy), submandibular left 01/15/2020   Other fatigue 01/15/2020   Chemotherapy induced neutropenia (HCC) 09/13/2018   Malignant neoplasm of upper-outer quadrant of right breast in female, estrogen receptor negative (HCC) 05/09/2018    Allergies:  Allergies  Allergen Reactions   Amoxicillin -Pot Clavulanate Diarrhea    diarrhea   Hydrocodone Itching   Keflex [Cephalexin] Diarrhea   Medications:  Current Outpatient Medications:    fluconazole  (DIFLUCAN ) 150 MG tablet, Take 1 tablet PO once. Repeat in 3 days if needed., Disp: 2 tablet, Rfl: 0   sulfamethoxazole -trimethoprim  (BACTRIM  DS) 800-160 MG tablet, Take 1 tablet by mouth 2 (two) times daily., Disp: 10 tablet, Rfl: 0   Calcium Acetate-Magnesium Carb 450-200 MG TABS, Take by mouth., Disp: , Rfl:    Cholecalciferol (  VITAMIN D ) 125 MCG (5000 UT) CAPS, Take 5,000 Units by mouth daily., Disp: 30 capsule, Rfl:    cyclobenzaprine  (FLEXERIL ) 10 MG tablet, Take 1 tablet (10 mg total) by mouth as needed for muscle spasms. 1/2 to I tab TID, Disp: 30 tablet, Rfl: 2   fluticasone  (FLONASE ) 50 MCG/ACT  nasal spray, USE 2 SPRAYS IN EACH NOSTRIL DAILY, Disp: 48 g, Rfl: 3   hydrocortisone  (ANUSOL -HC) 25 MG suppository, Place 1 suppository (25 mg total) rectally 2 (two) times daily., Disp: 12 suppository, Rfl: 0   hydrocortisone  2.5 % cream, Apply topically as needed., Disp: 30 g, Rfl: 2   loratadine  (CLARITIN ) 10 MG tablet, Take 10 mg by mouth daily as needed for allergies., Disp: , Rfl:    Multiple Vitamins-Minerals (MULTIVITAMIN WITH MINERALS) tablet, Take 1 tablet by mouth daily., Disp: , Rfl:    Probiotic Product (PROBIOTIC DAILY PO), Take 1 tablet by mouth., Disp: , Rfl:    Turmeric 500 MG CAPS, Take 1 capsule by mouth daily., Disp: , Rfl:   Observations/Objective: Patient is well-developed, well-nourished in no acute distress.  Resting comfortably at home.  Head is normocephalic, atraumatic.  No labored breathing. Speech is clear and coherent with logical content.  Patient is alert and oriented at baseline.   Assessment and Plan: 1. Suspected UTI (Primary) - sulfamethoxazole -trimethoprim  (BACTRIM  DS) 800-160 MG tablet; Take 1 tablet by mouth 2 (two) times daily.  Dispense: 10 tablet; Refill: 0 - fluconazole  (DIFLUCAN ) 150 MG tablet; Take 1 tablet PO once. Repeat in 3 days if needed.  Dispense: 2 tablet; Refill: 0  Classic UTI symptoms with absence of alarm signs or symptoms. Prior history of UTI. Will treat empirically with Bactrim  for suspected uncomplicated cystitis. Diflucan  placed on file in case of antibiotic-induced yeast infection. Giving increased frequency of UTI and menopausal state, recommended trial of vaginal moisturizers and follow-up with PCP or GYN. Supportive measures and OTC medications reviewed. Strict in-person evaluation precautions discussed.    Follow Up Instructions: I discussed the assessment and treatment plan with the patient. The patient was provided an opportunity to ask questions and all were answered. The patient agreed with the plan and demonstrated an  understanding of the instructions.  A copy of instructions were sent to the patient via MyChart unless otherwise noted below.   The patient was advised to call back or seek an in-person evaluation if the symptoms worsen or if the condition fails to improve as anticipated.    Darlene Velma Lunger, PA-C

## 2024-03-01 ENCOUNTER — Encounter: Payer: Self-pay | Admitting: Family Medicine

## 2024-03-04 NOTE — Progress Notes (Unsigned)
   ANNUAL EXAM Patient name: Darlene Grant MRN [id number]  Date of birth: August 28, 1975 Chief Complaint:   No chief complaint on file.  History of Present Illness:   Darlene Grant is a 48 y.o. G27P2002 female being seen today for a routine annual exam.   Current concerns: ***  Notable History: Breast cancer diagnosed in 2020 - had right mastectomy and then ultimately left mastectomy which was benign. 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  Had some possible PMB last year and EMB was negative.   Current birth control: ***  No LMP recorded. Patient is perimenopausal.   Last Pap/Pap History:  2013: pap normal 2016: pap/hpv wnl 2018: pap/hpv wnl 2020: pap/hpv wnl 02/2021: pap/hpv wnl.    Review of Systems:   Pertinent items are noted in HPI Denies any headaches, blurred vision, fatigue, shortness of breath, chest pain, abdominal pain, abnormal vaginal discharge/itching/odor/irritation, problems with periods, bowel movements, urination, or intercourse unless otherwise stated above. *** Pertinent History Reviewed:  Reviewed past medical,surgical, social and family history.  Reviewed problem list, medications and allergies. Physical Assessment:  There were no vitals filed for this visit.There is no height or weight on file to calculate BMI.   Physical Examination:  General appearance - well appearing, and in no distress Mental status - alert, oriented to person, place, and time Psych:  She has a normal mood and affect Skin - warm and dry, normal color, no suspicious lesions noted Chest - effort normal Heart - normal rate  Breasts - breasts appear normal, no suspicious masses, no skin or nipple changes or axillary nodes Abdomen - soft, nontender, nondistended, no masses or organomegaly Pelvic -  {Blank single:19197::Performed and:,declines,not indicated} VULVA: {Blank single:19197::Not examined,normal appearing vulva with no masses, tenderness or  lesions,***} VAGINA: {Blank single:19197::Not examined,normal appearing vagina with normal color and discharge, no lesions,***} CERVIX: {Blank single:19197::Not examined,normal appearing cervix without discharge or lesions, no CMT.,***} UTERUS: {Blank single:19197::Not examined,uterus is felt to be normal size, shape, consistency and nontender,***} ADNEXA: {Blank single:19197::Not examined,No adnexal masses or tenderness noted,***} Extremities:  No swelling or varicosities noted  Chaperone present for exam  No results found for this or any previous visit (from the past 24 hours).  Assessment & Plan:  Diagnoses and all orders for this visit:  Encounter for annual routine gynecological examination  - Cervical cancer screening: Discussed guidelines. Pap with HPV wnl 03/14/21 - STD Testing: {Blank single:19197::accepts,declines,not indicated} - Birth Control: *** - Breast Health: s/p bilateral masectomy - F/U 12 months and prn     No orders of the defined types were placed in this encounter.   Meds: No orders of the defined types were placed in this encounter.   Follow-up: No follow-ups on file.  Vina Solian, MD 03/04/2024 3:49 PM

## 2024-03-06 ENCOUNTER — Ambulatory Visit (INDEPENDENT_AMBULATORY_CARE_PROVIDER_SITE_OTHER): Admitting: Obstetrics and Gynecology

## 2024-03-06 ENCOUNTER — Encounter: Payer: Self-pay | Admitting: Obstetrics and Gynecology

## 2024-03-06 VITALS — BP 116/82 | HR 73 | Ht 64.0 in | Wt 145.0 lb

## 2024-03-06 DIAGNOSIS — N390 Urinary tract infection, site not specified: Secondary | ICD-10-CM | POA: Diagnosis not present

## 2024-03-06 DIAGNOSIS — N898 Other specified noninflammatory disorders of vagina: Secondary | ICD-10-CM | POA: Diagnosis not present

## 2024-03-06 DIAGNOSIS — Z01419 Encounter for gynecological examination (general) (routine) without abnormal findings: Secondary | ICD-10-CM

## 2024-03-06 MED ORDER — NITROFURANTOIN MONOHYD MACRO 100 MG PO CAPS: 100.0000 mg | ORAL_CAPSULE | Freq: Every day | ORAL | 3 refills | Status: AC | PRN

## 2024-03-07 ENCOUNTER — Ambulatory Visit: Payer: Self-pay | Admitting: Obstetrics and Gynecology

## 2024-03-07 LAB — FOLLICLE STIMULATING HORMONE: FSH: 89.3 m[IU]/mL

## 2024-03-19 ENCOUNTER — Other Ambulatory Visit: Payer: Self-pay

## 2024-03-19 ENCOUNTER — Encounter: Payer: Self-pay | Admitting: Family Medicine

## 2024-03-19 ENCOUNTER — Ambulatory Visit
Admission: RE | Admit: 2024-03-19 | Discharge: 2024-03-19 | Disposition: A | Discharge status: Home / Self Care | Attending: Family Medicine | Admitting: Family Medicine

## 2024-03-19 VITALS — BP 126/80 | HR 87 | Temp 98.3°F | Resp 16

## 2024-03-19 DIAGNOSIS — L03012 Cellulitis of left finger: Secondary | ICD-10-CM | POA: Diagnosis not present

## 2024-03-19 DIAGNOSIS — L03011 Cellulitis of right finger: Secondary | ICD-10-CM

## 2024-03-19 DIAGNOSIS — L980 Pyogenic granuloma: Secondary | ICD-10-CM

## 2024-03-19 HISTORY — DX: Lymphedema, not elsewhere classified: I89.0

## 2024-03-19 MED ORDER — DOXYCYCLINE HYCLATE 100 MG PO CAPS: 100.0000 mg | ORAL_CAPSULE | Freq: Two times a day (BID) | ORAL | 0 refills | Status: DC

## 2024-03-19 NOTE — ED Triage Notes (Signed)
 Infection to right thumb x 5-6 days. Has lymphedema to right arm. Has been using neosporin + pain, saline rinses. No otc oral meds. Does not like taking nsaids d/t hx of internal bleeding.

## 2024-03-19 NOTE — Discharge Instructions (Signed)
 Take doxycycline  2 times a day until infection has cleared You will probably only need to take this for 3 to 5 days

## 2024-03-19 NOTE — ED Provider Notes (Signed)
 Darlene Grant    CSN: [id number] Arrival date & time: 03/19/24  1515      History   Chief Complaint Chief Complaint  Patient presents with   Hand Pain    HPI Darlene Grant is a 48 y.o. female.   Patient has a painful growth at the at the edge of her right thumbnail.  It is not healing.  She has been trying soaking, cleaning, Neosporin, and bandages.  She is here for evaluation.  Patient is otherwise in good health.  She does have lymphedema in this arm because of prior history of breast cancer    Past Medical History:  Diagnosis Date   Abnormal Pap smear    ASCUS 03/13/11   Anemia    Breast cancer (HCC)    Complication of anesthesia    pt states that all narcotics make her angry and she would prefer to avoid them.   Grade I hemorrhoids 02/23/2017   Lymphedema    Piriformis syndrome of left side 01/15/2018   Vaginal Pap smear, abnormal     Patient Active Problem List   Diagnosis Date Noted   Iron  deficiency anemia 09/07/2023   Overweight (BMI 25.0-29.9) 11/27/2022   Physical exam 11/10/2020   lymphedema, right arm 01/15/2020   LAD (lymphadenopathy), submandibular left 01/15/2020   Other fatigue 01/15/2020   Chemotherapy induced neutropenia 09/13/2018   Malignant neoplasm of upper-outer quadrant of right breast in female, estrogen receptor negative (HCC) 05/09/2018    Past Surgical History:  Procedure Laterality Date   BREAST BIOPSY Right 8/16   CESAREAN SECTION N/A 11/21/2013   Procedure: CESAREAN SECTION;  Surgeon: Nena DELENA App, MD;  Location: [facility];  Service: Obstetrics;  Laterality: N/A;   CESAREAN SECTION N/A 06/23/2016   Procedure: CESAREAN SECTION;  Surgeon: Harland JAYSON Birkenhead, MD;  Location: [facility];  Service: Obstetrics;  Laterality: N/A;   HAND SURGERY  04/2012   ligament repair   MASTECTOMY WITH AXILLARY LYMPH NODE DISSECTION Bilateral 11/14/2018   Procedure: RIGHT MASTECTOMY WITH RIGHT AXILLARY LYMPH NODE DISSECTION AND  LEFT RISK REDUCING MASTECTOMY;  Surgeon: Ebbie Cough, MD;  Location: [facility];  Service: General;  Laterality: Bilateral;  LATE ENTRY: Corrected laterality documentation to read right axillary lymph node dissection vs. documented laterality of left.     PILONIDAL CYST EXCISION  2000   PORTACATH PLACEMENT N/A 05/28/2018   Procedure: INSERTION PORT-A-CATH WITH ULTRASOUND;  Surgeon: Ebbie Cough, MD;  Location:  SURGERY CENTER;  Service: General;  Laterality: N/A;   WISDOM TOOTH EXTRACTION  AGE 81    OB History     Gravida  2   Para  2   Term  2   Preterm      AB      Living  2      SAB      IAB      Ectopic      Multiple  0   Live Births  2            Home Medications    Prior to Admission medications   Medication Sig Start Date End Date Taking? Authorizing Provider  doxycycline  (VIBRAMYCIN ) 100 MG capsule Take 1 capsule (100 mg total) by mouth 2 (two) times daily. 03/19/24  Yes Maranda Jamee Jacob, MD  Calcium Acetate-Magnesium Carb 450-200 MG TABS Take by mouth.    [provider]  Cholecalciferol (VITAMIN D ) 125 MCG (5000 UT) CAPS Take 5,000 Units by mouth  daily. 11/24/19   Gudena, Vinay, MD  cyclobenzaprine  (FLEXERIL ) 10 MG tablet Take 1 tablet (10 mg total) by mouth as needed for muscle spasms. 1/2 to I tab TID 12/14/22   Tabori, Katherine E, MD  fluticasone  (FLONASE ) 50 MCG/ACT nasal spray USE 2 SPRAYS IN EACH NOSTRIL DAILY 12/14/21   Tabori, Katherine E, MD  hydrocortisone  (ANUSOL -HC) 25 MG suppository Place 1 suppository (25 mg total) rectally 2 (two) times daily. 11/30/23   Tabori, Katherine E, MD  hydrocortisone  2.5 % cream Apply topically as needed. 12/14/22   Tabori, Katherine E, MD  loratadine  (CLARITIN ) 10 MG tablet Take 10 mg by mouth daily as needed for allergies.    [provider]  Multiple Vitamins-Minerals (MULTIVITAMIN WITH MINERALS) tablet Take 1 tablet by mouth daily.    [provider]   nitrofurantoin , macrocrystal-monohydrate, (MACROBID ) 100 MG capsule Take 1 capsule (100 mg total) by mouth daily as needed (After intercourse). 03/06/24   Cleatus Moccasin, MD  Probiotic Product (PROBIOTIC DAILY PO) Take 1 tablet by mouth.    [provider]  Turmeric 500 MG CAPS Take 1 capsule by mouth daily. 11/24/19   Odean Potts, MD    Family History Family History  Problem Relation Age of Onset   Stomach cancer Maternal Grandmother    Skin cancer Maternal Grandmother    Depression Maternal Grandmother    Lung cancer Maternal Grandfather        lung   Colon polyps Father    Hypertension Father    Colitis Father    Breast cancer Mother 60   Liver disease Neg Hx    Esophageal cancer Neg Hx    Colon cancer Neg Hx    Rectal cancer Neg Hx     Social History Social History   Tobacco Use   Smoking status: Never   Smokeless tobacco: Never  Vaping Use   Vaping status: Never Used  Substance Use Topics   Alcohol use: Yes    Alcohol/week: 0.0 standard drinks of alcohol    Comment: occassionally   Drug use: No     Allergies   Amoxicillin -pot clavulanate, Hydrocodone, and Keflex [cephalexin]   Review of Systems Review of Systems See HPI  Physical Exam Triage Vital Signs ED Triage Vitals  Encounter Vitals Group     BP 03/19/24 1520 126/80     Girls Systolic BP Percentile --      Girls Diastolic BP Percentile --      Boys Systolic BP Percentile --      Boys Diastolic BP Percentile --      Pulse Rate 03/19/24 1520 87     Resp 03/19/24 1520 16     Temp 03/19/24 1520 98.3 F (36.8 C)     Temp src --      SpO2 03/19/24 1520 100 %     Weight --      Height --      Head Circumference --      Peak Flow --      Pain Score 03/19/24 1523 6     Pain Loc --      Pain Education --      Exclude from Growth Chart --    No data found.  Updated Vital Signs BP 126/80   Pulse 87   Temp 98.3 F (36.8 C)   Resp 16   LMP 09/20/2022   SpO2 100%       Physical  Exam Constitutional:      General:  She is not in acute distress.    Appearance: She is well-developed.  HENT:     Head: Normocephalic and atraumatic.  Eyes:     Conjunctiva/sclera: Conjunctivae normal.     Pupils: Pupils are equal, round, and reactive to light.  Cardiovascular:     Rate and Rhythm: Normal rate.  Pulmonary:     Effort: Pulmonary effort is normal. No respiratory distress.  Abdominal:     General: There is no distension.     Palpations: Abdomen is soft.  Musculoskeletal:        General: Normal range of motion.     Cervical back: Normal range of motion.  Skin:    General: Skin is warm and dry.     Findings: Lesion present.     Comments: There is a pyogenic granuloma  at the edge of the right thumbnail with some erythema of the surrounding skin.  No purulence.  Neurological:     Mental Status: She is alert.      UC Treatments / Results  Labs (all labs ordered are listed, but only abnormal results are displayed) Labs Reviewed - No data to display  EKG   Radiology No results found.  Procedures Procedures (including critical Grant time)  Medications Ordered in UC Medications - No data to display  Initial Impression / Assessment and Plan / UC Course  I have reviewed the triage vital signs and the nursing notes.  Pertinent labs & imaging results that were available during my Grant of the patient were reviewed by me and considered in my medical decision making (see chart for details).     Area was soaked.  Treated with silver nitrate.  Home Grant as discussed. Final Clinical Impressions(s) / UC Diagnoses   Final diagnoses:  Paronychia of finger, left  Pyogenic granuloma     Discharge Instructions      Take doxycycline  2 times a day until infection has cleared You will probably only need to take this for 3 to 5 days    ED Prescriptions     Medication Sig Dispense Auth. Provider   doxycycline  (VIBRAMYCIN ) 100 MG capsule Take 1 capsule (100 mg  total) by mouth 2 (two) times daily. 14 capsule Maranda Jamee Jacob, MD      PDMP not reviewed this encounter.   Maranda Jamee Jacob, MD 03/19/24 [phone]

## 2024-03-20 NOTE — Telephone Encounter (Signed)
 Patient sent MyChart message in regards to visit summary being incorrect. I was not able to find the clinical pool to send this message. I hope it was okay to send to you.

## 2024-03-20 NOTE — Telephone Encounter (Signed)
 Patient would like to send her thanks for changing note.

## 2024-03-31 DIAGNOSIS — Z171 Estrogen receptor negative status [ER-]: Secondary | ICD-10-CM | POA: Diagnosis not present

## 2024-03-31 DIAGNOSIS — C50411 Malignant neoplasm of upper-outer quadrant of right female breast: Secondary | ICD-10-CM | POA: Diagnosis not present

## 2024-04-09 LAB — SIGNATERA
SIGNATERA MTM READOUT: 0 MTM/ml
SIGNATERA TEST RESULT: NEGATIVE

## 2024-04-22 ENCOUNTER — Inpatient Hospital Stay

## 2024-04-22 ENCOUNTER — Inpatient Hospital Stay: Attending: Hematology and Oncology | Admitting: Hematology and Oncology

## 2024-04-22 VITALS — BP 99/70 | HR 76 | Temp 98.1°F | Resp 18 | Ht 64.0 in | Wt 145.0 lb

## 2024-04-22 DIAGNOSIS — E611 Iron deficiency: Secondary | ICD-10-CM | POA: Diagnosis not present

## 2024-04-22 DIAGNOSIS — C50411 Malignant neoplasm of upper-outer quadrant of right female breast: Secondary | ICD-10-CM | POA: Diagnosis not present

## 2024-04-22 DIAGNOSIS — D649 Anemia, unspecified: Secondary | ICD-10-CM | POA: Diagnosis present

## 2024-04-22 DIAGNOSIS — Z853 Personal history of malignant neoplasm of breast: Secondary | ICD-10-CM | POA: Diagnosis present

## 2024-04-22 DIAGNOSIS — Z171 Estrogen receptor negative status [ER-]: Secondary | ICD-10-CM

## 2024-04-22 DIAGNOSIS — Z08 Encounter for follow-up examination after completed treatment for malignant neoplasm: Secondary | ICD-10-CM | POA: Diagnosis present

## 2024-04-22 LAB — CBC WITH DIFFERENTIAL (CANCER CENTER ONLY)
Abs Immature Granulocytes: 0.01 K/uL (ref 0.00–0.07)
Basophils Absolute: 0 K/uL (ref 0.0–0.1)
Basophils Relative: 0 %
Eosinophils Absolute: 0 K/uL (ref 0.0–0.5)
Eosinophils Relative: 1 %
HCT: 36.3 % (ref 36.0–46.0)
Hemoglobin: 12.5 g/dL (ref 12.0–15.0)
Immature Granulocytes: 0 %
Lymphocytes Relative: 28 %
Lymphs Abs: 1.2 K/uL (ref 0.7–4.0)
MCH: 31.5 pg (ref 26.0–34.0)
MCHC: 34.4 g/dL (ref 30.0–36.0)
MCV: 91.4 fL (ref 80.0–100.0)
Monocytes Absolute: 0.3 K/uL (ref 0.1–1.0)
Monocytes Relative: 6 %
Neutro Abs: 2.7 K/uL (ref 1.7–7.7)
Neutrophils Relative %: 65 %
Platelet Count: 202 K/uL (ref 150–400)
RBC: 3.97 MIL/uL (ref 3.87–5.11)
RDW: 12.4 % (ref 11.5–15.5)
WBC Count: 4.1 K/uL (ref 4.0–10.5)
nRBC: 0 % (ref 0.0–0.2)

## 2024-04-22 LAB — CMP (CANCER CENTER ONLY)
ALT: 10 U/L (ref 0–44)
AST: 12 U/L — ABNORMAL LOW (ref 15–41)
Albumin: 4.7 g/dL (ref 3.5–5.0)
Alkaline Phosphatase: 64 U/L (ref 38–126)
Anion gap: 5 (ref 5–15)
BUN: 17 mg/dL (ref 6–20)
CO2: 31 mmol/L (ref 22–32)
Calcium: 9.7 mg/dL (ref 8.9–10.3)
Chloride: 104 mmol/L (ref 98–111)
Creatinine: 0.72 mg/dL (ref 0.44–1.00)
GFR, Estimated: >60 mL/min (ref >60)
Glucose, Bld: 87 mg/dL (ref 70–99)
Potassium: 3.8 mmol/L (ref 3.5–5.1)
Sodium: 140 mmol/L (ref 135–145)
Total Bilirubin: 1.4 mg/dL — ABNORMAL HIGH (ref 0.0–1.2)
Total Protein: 7 g/dL (ref 6.5–8.1)

## 2024-04-22 LAB — FERRITIN: Ferritin: 96 ng/mL (ref 11–307)

## 2024-04-22 LAB — IRON AND IRON BINDING CAPACITY (CC-WL,HP ONLY)
Iron: 112 ug/dL (ref 28–170)
Saturation Ratios: 34 % — ABNORMAL HIGH (ref 10.4–31.8)
TIBC: 332 ug/dL (ref 250–450)
UIBC: 220 ug/dL (ref 148–442)

## 2024-04-22 NOTE — Assessment & Plan Note (Signed)
 05/03/2018: right breast upper outer quadrant and right axillary lymph node biopsy 05/03/2018 for a clinical T2 N2, stage IIIC invasive ductal carcinoma, triple negative, with an Ki-67 75%, no deleterious genetic mutations   06/03/2018-10/18/2018: Neoadjuvant chemotherapy with dose dense Adriamycin  and Cytoxan  followed by Taxol  and carboplatin    11/14/2018: Bilateral mastectomies: Left mastectomy: Fibrocystic changes, right mastectomy: Complete pathologic response no evidence of residual cancer, 0/13 lymph nodes negative Completed adjuvant radiation therapy ---------------------------------------------------------------------------------------------------------------------------------------------------------------  Treatment plan: Surveillance November 2020: CT CAP, CTneck, CT head: No evidence of metastatic disease.  Soft tissue edema in the right axilla 05/14/2019: Biopsy right axilla: Fibrosis no malignancy. Bone scan 05/20/2019: No evidence of bone metastases.   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 Breast/chest wall examination: 06/04/2023: Benign, no palpable lumps or nodules.  She has minor keloid on the left chest wall. 2. no role of imaging studies because she had bilateral mastectomies. 3.  Signatera: Negative   [facility] emergency room: Severe anemia: CT abdomen pelvis: Complex nonspecific 3.5 cm solid well-circumscribed left lower quadrant mesenteric mass.   PET CT scan 09/01/2023: Mesenteric mass (3.3 cm.  Prior CT in 2019 and measured 3.4 cm) does not demonstrate any hypermetabolic activity. Could be unusual extrarenal meta nephric tissue. No other evidence of metastatic disease.  Liver spleen scan 10/11/2023: Soft tissue mass does not accumulate colloid radiotracer and therefore is not consistent with  an accessory spleen   EGD and colonoscopy are pending with Dr. Federico  I will see her back in 6 months for follow-up.

## 2024-04-22 NOTE — Progress Notes (Signed)
 Patient Care Team: Mahlon Comer BRAVO, MD as PCP - General (Family Medicine) Ebbie Cough, MD as Consulting Physician (General Surgery) Shannon Agent, MD as Consulting Physician (Radiation Oncology) Livingston Rigg, MD as Consulting Physician (Dermatology) Tyree Nanetta SAILOR, RN as Oncology Nurse Navigator [place], Amy V, OD (Optometry) Odean Potts, MD as Consulting Physician (Hematology and Oncology)  DIAGNOSIS:  Encounter Diagnosis  Name Primary?   Malignant neoplasm of upper-outer quadrant of right breast in female, estrogen receptor negative (HCC) Yes    SUMMARY OF ONCOLOGIC HISTORY: Oncology History  Malignant neoplasm of upper-outer quadrant of right breast in female, estrogen receptor negative (HCC)  05/03/2018 Initial Diagnosis   right breast upper outer quadrant and right axillary lymph node biopsy 05/03/2018 for a clinical T2 N2, stage IIIC invasive ductal carcinoma, triple negative, with an Ki-67 of 75%   06/03/2018 - 10/18/2018 Neo-Adjuvant Chemotherapy   Dose dense Adriamycin  and Cytoxan  followed by Taxol  and carboplatin  x12   11/14/2018 Surgery   Bilateral mastectomies: Left mastectomy: Fibrocystic changes, right mastectomy: Complete pathologic response no evidence of residual cancer, 0/13 lymph nodes negative   12/31/2018 - 02/10/2019 Radiation Therapy   Adjuvant XRT     CHIEF COMPLIANT: Surveillance of breast cancer, prior history of anemia  HISTORY OF PRESENT ILLNESS:  History of Present Illness Darlene Grant is a 48 year old female with prior history of breast cancer, anemia possibly due to NSAID use, Barrett's esophagus who presents for follow-up of her gastrointestinal issues and blood work results.  Her recent blood work shows a normal hemoglobin level of 12.5, with normal liver, kidney, and electrolyte levels. She is awaiting ferritin results. She had a past blood transfusion and initially requested more frequent monitoring but now feels  that quarterly visits are unnecessary. She plans to visit her family doctor in April for annual blood work, including iron  studies and ferritin. She has a history of anemia but is not currently concerned about it. She is comfortable with the current frequency of her Signatera tests, conducted twice a year.     ALLERGIES:  is allergic to amoxicillin -pot clavulanate, hydrocodone, and keflex [cephalexin].  MEDICATIONS:  Current Outpatient Medications  Medication Sig Dispense Refill   Calcium Acetate-Magnesium Carb 450-200 MG TABS Take by mouth.     Cholecalciferol (VITAMIN D ) 125 MCG (5000 UT) CAPS Take 5,000 Units by mouth daily. 30 capsule    cyclobenzaprine  (FLEXERIL ) 10 MG tablet Take 1 tablet (10 mg total) by mouth as needed for muscle spasms. 1/2 to I tab TID 30 tablet 2   doxycycline  (VIBRAMYCIN ) 100 MG capsule Take 1 capsule (100 mg total) by mouth 2 (two) times daily. 14 capsule 0   fluticasone  (FLONASE ) 50 MCG/ACT nasal spray USE 2 SPRAYS IN EACH NOSTRIL DAILY 48 g 3   hydrocortisone  (ANUSOL -HC) 25 MG suppository Place 1 suppository (25 mg total) rectally 2 (two) times daily. 12 suppository 0   hydrocortisone  2.5 % cream Apply topically as needed. 30 g 2   loratadine  (CLARITIN ) 10 MG tablet Take 10 mg by mouth daily as needed for allergies.     Multiple Vitamins-Minerals (MULTIVITAMIN WITH MINERALS) tablet Take 1 tablet by mouth daily.     nitrofurantoin , macrocrystal-monohydrate, (MACROBID ) 100 MG capsule Take 1 capsule (100 mg total) by mouth daily as needed (After intercourse). 30 capsule 3   Probiotic Product (PROBIOTIC DAILY PO) Take 1 tablet by mouth.     Turmeric 500 MG CAPS Take 1 capsule by mouth daily.     No  current facility-administered medications for this visit.    PHYSICAL EXAMINATION: ECOG PERFORMANCE STATUS: 1 - Symptomatic but completely ambulatory  Vitals:   04/22/24 1046  BP: 99/70  Pulse: 76  Resp: 18  Temp: 98.1 F (36.7 C)  SpO2: 100%   Filed Weights    04/22/24 1046  Weight: 145 lb (65.8 kg)    Physical Exam GENERAL: Normal exam findings.  (exam performed in the presence of a chaperone)  LABORATORY DATA:  I have reviewed the data as listed    Latest Ref Rng & Units 04/22/2024   10:33 AM 01/23/2024    8:07 AM 11/30/2023    1:12 PM  CMP  Glucose 70 - 99 mg/dL 87  81  85   BUN 6 - 20 mg/dL 17  13  13    Creatinine 0.44 - 1.00 mg/dL 9.27  9.28  9.26   Sodium 135 - 145 mmol/L 140  139  140   Potassium 3.5 - 5.1 mmol/L 3.8  3.7  4.5   Chloride 98 - 111 mmol/L 104  103  101   CO2 22 - 32 mmol/L 31  31  31    Calcium 8.9 - 10.3 mg/dL 9.7  9.5  89.5   Total Protein 6.5 - 8.1 g/dL 7.0  7.1  7.5   Total Bilirubin 0.0 - 1.2 mg/dL 1.4  1.2  0.9   Alkaline Phos 38 - 126 U/L 64  63  56   AST 15 - 41 U/L 12  13  14    ALT 0 - 44 U/L 10  13  16      Lab Results  Component Value Date   WBC 4.1 04/22/2024   HGB 12.5 04/22/2024   HCT 36.3 04/22/2024   MCV 91.4 04/22/2024   PLT 202 04/22/2024   NEUTROABS 2.7 04/22/2024    ASSESSMENT & PLAN:  Malignant neoplasm of upper-outer quadrant of right breast in female, estrogen receptor negative (HCC) 05/03/2018: right breast upper outer quadrant and right axillary lymph node biopsy 05/03/2018 for a clinical T2 N2, stage IIIC invasive ductal carcinoma, triple negative, with an Ki-67 75%, no deleterious genetic mutations   06/03/2018-10/18/2018: Neoadjuvant chemotherapy with dose dense Adriamycin  and Cytoxan  followed by Taxol  and carboplatin    11/14/2018: Bilateral mastectomies: Left mastectomy: Fibrocystic changes, right mastectomy: Complete pathologic response no evidence of residual cancer, 0/13 lymph nodes negative Completed adjuvant radiation therapy ---------------------------------------------------------------------------------------------------------------------------------------------------------------  Treatment plan: Surveillance November 2020: CT CAP, CTneck, CT head: No evidence of  metastatic disease.  Soft tissue edema in the right axilla 05/14/2019: Biopsy right axilla: Fibrosis no malignancy. Bone scan 05/20/2019: No evidence of bone metastases.   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 Breast/chest wall examination: 06/04/2023: Benign, no palpable lumps or nodules.  She has minor keloid on the left chest wall. 2. no role of imaging studies because she had bilateral mastectomies. 3.  Signatera: Negative   [facility] emergency room: Severe anemia: CT abdomen pelvis: Complex nonspecific 3.5 cm solid well-circumscribed left lower quadrant mesenteric mass.   PET CT scan 09/01/2023: Mesenteric mass (3.3 cm.  Prior CT in 2019 and measured 3.4 cm) does not demonstrate any hypermetabolic activity. Could be unusual extrarenal meta nephric tissue. No other evidence of metastatic disease.  Liver spleen scan 10/11/2023: Soft tissue mass does not accumulate colloid radiotracer and therefore is  not consistent with an accessory spleen   EGD and colonoscopy: Dr. Federico: Barrett's esophagus  I will see her back in 1 year for follow-up. Assessment & Plan Malignant neoplasm of upper-outer quadrant of right breast, status post treatment Signatera test results consistently negative. - Continue annual follow-up appointments with Dr. Ebbie. - Continue Signatera testing twice a year. - Cancel the December 16th oncology appointment as it is redundant.  Incidental mesenteric mass Remains stable with no changes on recent imaging. Considered incidental and not of concern at this time.  Barrett's esophagus without dysplasia Condition is without dysplasia. - Schedule follow-up endoscopy in five years.      Orders Placed This Encounter  Procedures   CBC with Differential (Cancer Center Only)     Standing Status:   Future    Expiration Date:   04/22/2025   CMP (Cancer Center only)    Standing Status:   Future    Expiration Date:   04/22/2025   Ferritin    Standing Status:   Future    Expiration Date:   04/22/2025   Iron  and Iron  Binding Capacity ([facility] only)    Standing Status:   Future    Expiration Date:   04/22/2025   The patient has a good understanding of the overall plan. she agrees with it. she will call with any problems that may develop before the next visit here.  I personally spent a total of 30 minutes in the care of the patient today including preparing to see the patient, getting/reviewing separately obtained history, performing a medically appropriate exam/evaluation, counseling and educating, placing orders, referring and communicating with other health care professionals, documenting clinical information in the EHR, independently interpreting results, communicating results, and coordinating care.   Viinay K Aviyanna Colbaugh, MD 04/22/24

## 2024-05-29 ENCOUNTER — Ambulatory Visit: Admitting: Obstetrics and Gynecology

## 2024-05-29 ENCOUNTER — Other Ambulatory Visit (HOSPITAL_COMMUNITY)
Admission: RE | Admit: 2024-05-29 | Discharge: 2024-05-29 | Disposition: A | Source: Ambulatory Visit | Attending: Obstetrics and Gynecology | Admitting: Obstetrics and Gynecology

## 2024-05-29 ENCOUNTER — Encounter: Payer: Self-pay | Admitting: Obstetrics and Gynecology

## 2024-05-29 VITALS — BP 123/82 | HR 81 | Wt 144.0 lb

## 2024-05-29 DIAGNOSIS — N950 Postmenopausal bleeding: Secondary | ICD-10-CM | POA: Insufficient documentation

## 2024-05-29 DIAGNOSIS — Z3202 Encounter for pregnancy test, result negative: Secondary | ICD-10-CM | POA: Diagnosis not present

## 2024-05-29 DIAGNOSIS — N858 Other specified noninflammatory disorders of uterus: Secondary | ICD-10-CM | POA: Diagnosis not present

## 2024-05-29 LAB — POCT URINE PREGNANCY: Preg Test, Ur: NEGATIVE

## 2024-05-29 NOTE — Patient Instructions (Addendum)
 It was nice meeting you today. You will see your results in the MyChart app within 1 week.   It is normal to have cramping and bleeding for the next 2-3 days. You should feel better every day.   Please call us  if you have any severe pain, bleeding that soaks more than 1 pad in a hour, have fevers, or feel like you're going to pass out.   You can take tylenol  and ibuprofen  as needed for pain. It is ok to take both at the same time.

## 2024-05-29 NOTE — Progress Notes (Signed)
°  °  °  GYNECOLOGY OFFICE PROCEDURE NOTE   Darlene Grant is a 48 y.o. [id number] here for endometrial biopsy for PMB. Hx breast cancer, has not had consistent periods since completing chemo, but has had episodes of light bleeding spotting since at least 2024. Has gone 1+ year without any bleeding/periods since March 2024. Notes an episode of light bleeding/spotting that started yesterday and came in for eval today. Last pap NILM/HPV neg 03/14/21.  Offered pelvic US  vs EMB today and she would like to proceed with EMB.    ENDOMETRIAL BIOPSY     The indications for endometrial biopsy were reviewed.   Risks of the biopsy including cramping, bleeding, infection, uterine perforation, inadequate specimen and need for additional procedures were discussed. Offered alternative of hysteroscopy, dilation and curettage in OR. The patient states she understands the R/B/I/A and agrees to undergo procedure today. Urine pregnancy test was Negative. Consent was signed. Time out was performed.    Patient was positioned in dorsal lithotomy position. BME with normal retroverted uterus.  A vaginal speculum was placed.  The cervix was visualized and was prepped with Betadine. Initially attempted to sample without tenaculum but only got to depth of 5cm and sample appeared to be cervical mucus only. 2cc of 1% lidocaine  were injected intracervically at 12 o'clock for analgesia. A single-toothed tenaculum was placed on the anterior lip of the cervix to stabilize it. Os finders were required to dilate the cervix. The 3 mm pipelle was introduced into the endometrial cavity to a depth of 8 cm, and a Moderate amount of tissue was obtained after two passes and sent to pathology. The instruments were removed from the patient's vagina. Minimal bleeding from the cervix was noted. The patient tolerated the procedure well.   Patient was given post procedure instructions.  Will follow up pathology and manage accordingly; patient  will be contacted with results and recommendations.  Routine preventative health maintenance measures emphasized.  Ddx reviewed: inactive/atrophic endometrium, proliferative endometrium, polyp, hyperplasia, malignancy.    Kieth Carolin, MD Obstetrician & Gynecologist, [facility] for [employer], [facility]

## 2024-05-30 ENCOUNTER — Encounter: Payer: Self-pay | Admitting: Hematology and Oncology

## 2024-05-30 ENCOUNTER — Ambulatory Visit: Payer: Self-pay | Admitting: Obstetrics and Gynecology

## 2024-05-30 LAB — FOLLICLE STIMULATING HORMONE: FSH: 45 m[IU]/mL

## 2024-06-02 LAB — SURGICAL PATHOLOGY

## 2024-06-03 ENCOUNTER — Ambulatory Visit: Payer: BC Managed Care – PPO | Admitting: Hematology and Oncology

## 2024-07-22 ENCOUNTER — Inpatient Hospital Stay

## 2024-10-20 ENCOUNTER — Inpatient Hospital Stay

## 2025-04-23 ENCOUNTER — Inpatient Hospital Stay: Admitting: Hematology and Oncology

## 2025-04-23 ENCOUNTER — Inpatient Hospital Stay
